# Patient Record
Sex: Female | Born: 1975 | Race: White | Hispanic: No | Marital: Single | Smoking: Never smoker
Health system: Southern US, Community
[De-identification: ages and names within clinical notes are randomized; demographics above are authoritative.]

## PROBLEM LIST (undated history)

## (undated) DIAGNOSIS — E559 Vitamin D deficiency, unspecified: Secondary | ICD-10-CM

## (undated) DIAGNOSIS — Z803 Family history of malignant neoplasm of breast: Secondary | ICD-10-CM

## (undated) DIAGNOSIS — F419 Anxiety disorder, unspecified: Secondary | ICD-10-CM

## (undated) DIAGNOSIS — R51 Headache: Secondary | ICD-10-CM

## (undated) DIAGNOSIS — J189 Pneumonia, unspecified organism: Secondary | ICD-10-CM

## (undated) DIAGNOSIS — K219 Gastro-esophageal reflux disease without esophagitis: Secondary | ICD-10-CM

## (undated) DIAGNOSIS — I429 Cardiomyopathy, unspecified: Secondary | ICD-10-CM

## (undated) DIAGNOSIS — F32A Depression, unspecified: Secondary | ICD-10-CM

## (undated) DIAGNOSIS — Z800 Family history of malignant neoplasm of digestive organs: Secondary | ICD-10-CM

## (undated) DIAGNOSIS — C801 Malignant (primary) neoplasm, unspecified: Secondary | ICD-10-CM

## (undated) DIAGNOSIS — R519 Headache, unspecified: Secondary | ICD-10-CM

## (undated) DIAGNOSIS — Z87442 Personal history of urinary calculi: Secondary | ICD-10-CM

## (undated) HISTORY — DX: Anxiety disorder, unspecified: F41.9

## (undated) HISTORY — DX: Family history of malignant neoplasm of digestive organs: Z80.0

## (undated) HISTORY — DX: Family history of malignant neoplasm of breast: Z80.3

## (undated) HISTORY — DX: Vitamin D deficiency, unspecified: E55.9

## (undated) HISTORY — DX: Cardiomyopathy, unspecified: I42.9

## (undated) HISTORY — DX: Gastro-esophageal reflux disease without esophagitis: K21.9

## (undated) HISTORY — PX: OTHER SURGICAL HISTORY: SHX169

---

## 2012-05-02 ENCOUNTER — Encounter (HOSPITAL_COMMUNITY): Payer: Self-pay | Admitting: *Deleted

## 2012-05-02 ENCOUNTER — Emergency Department (HOSPITAL_COMMUNITY)
Admission: EM | Admit: 2012-05-02 | Discharge: 2012-05-03 | Disposition: A | Discharge status: Home / Self Care | Payer: 59 | Attending: Emergency Medicine | Admitting: Emergency Medicine

## 2012-05-02 DIAGNOSIS — R45851 Suicidal ideations: Secondary | ICD-10-CM | POA: Insufficient documentation

## 2012-05-02 DIAGNOSIS — R4789 Other speech disturbances: Secondary | ICD-10-CM | POA: Insufficient documentation

## 2012-05-02 LAB — COMPREHENSIVE METABOLIC PANEL
ALT: 11 U/L (ref 0–35)
AST: 15 U/L (ref 0–37)
Albumin: 3.4 g/dL — ABNORMAL LOW (ref 3.5–5.2)
Alkaline Phosphatase: 66 U/L (ref 39–117)
Chloride: 106 mEq/L (ref 96–112)
Potassium: 3.4 mEq/L — ABNORMAL LOW (ref 3.5–5.1)
Sodium: 138 mEq/L (ref 135–145)
Total Bilirubin: 0.5 mg/dL (ref 0.3–1.2)

## 2012-05-02 LAB — CBC
HCT: 33.2 % — ABNORMAL LOW (ref 36.0–46.0)
Platelets: 155 10*3/uL (ref 150–400)
RDW: 12.4 % (ref 11.5–15.5)
WBC: 5.2 10*3/uL (ref 4.0–10.5)

## 2012-05-02 LAB — DIFFERENTIAL
Basophils Absolute: 0 10*3/uL (ref 0.0–0.1)
Lymphocytes Relative: 18 % (ref 12–46)
Monocytes Relative: 5 % (ref 3–12)
Neutro Abs: 3.9 10*3/uL (ref 1.7–7.7)

## 2012-05-02 LAB — URINALYSIS, ROUTINE W REFLEX MICROSCOPIC
Glucose, UA: NEGATIVE mg/dL
Leukocytes, UA: NEGATIVE
Protein, ur: NEGATIVE mg/dL

## 2012-05-02 LAB — ETHANOL: Alcohol, Ethyl (B): <11 mg/dL (ref 0–11)

## 2012-05-02 LAB — RAPID URINE DRUG SCREEN, HOSP PERFORMED: Amphetamines: NOT DETECTED

## 2012-05-02 MED ORDER — FLUTICASONE PROPIONATE 50 MCG/ACT NA SUSP
2.0000 | Freq: Every day | NASAL | Status: DC
  Filled 2012-05-02: qty 16

## 2012-05-02 MED ORDER — TRAZODONE HCL 100 MG PO TABS: 100.0000 mg | ORAL_TABLET | Freq: Every day | ORAL | Status: DC

## 2012-05-02 MED ORDER — VENLAFAXINE HCL 75 MG PO TABS
75.0000 mg | ORAL_TABLET | Freq: Every morning | ORAL | Status: DC
  Filled 2012-05-02: qty 1

## 2012-05-02 NOTE — ED Provider Notes (Addendum)
History     CSN: [id number]  Arrival date & time 05/02/12  1758   First MD Initiated Contact with Patient 05/02/12 1809      Chief Complaint  Patient presents with  . Drug Overdose    (Consider location/radiation/quality/duration/timing/severity/associated sxs/prior treatment) Patient is a 36 y.o. female presenting with Overdose. The history is provided by the patient.  Drug Overdose  pt took 600mg  of trazodone pta in a suicide attempt. She admits to excessive stress at work--no etoh or drug use, no prior h/o same-denies tylenol or aspirin use--pt told her ex-bofriend she took the meds and he called ems  Past Medical History  Diagnosis Date  . Kidney stone     History reviewed. No pertinent past surgical history.  No family history on file.  History  Substance Use Topics  . Smoking status: Never Smoker   . Smokeless tobacco: Not on file  . Alcohol Use: No    OB History    Grav Para Term Preterm Abortions TAB SAB Ect Mult Living                  Review of Systems  All other systems reviewed and are negative.    Allergies  Review of patient's allergies indicates no known allergies.  Home Medications   Current Outpatient Rx  Name Route Sig Dispense Refill  . ALPRAZOLAM 2 MG PO TABS Oral Take 2 mg by mouth at bedtime as needed. Sleep    . FLUTICASONE PROPIONATE 50 MCG/ACT NA SUSP Nasal Place 2 sprays into the nose daily.    . TRAZODONE HCL 100 MG PO TABS Oral Take 100 mg by mouth at bedtime.    . VENLAFAXINE HCL 75 MG PO TABS Oral Take 75 mg by mouth every morning.      BP 96/51  Pulse 87  Temp 98.1 F (36.7 C) (Oral)  Resp 18  LMP 03/17/2012  Physical Exam  Nursing note and vitals reviewed. Constitutional: She is oriented to person, place, and time. She appears well-developed and well-nourished.  Non-toxic appearance. No distress.  HENT:  Head: Normocephalic and atraumatic.  Eyes: Conjunctivae, EOM and lids are normal. Pupils are equal, round, and  reactive to light.  Neck: Normal range of motion. Neck supple. No tracheal deviation present. No mass present.  Cardiovascular: Normal rate, regular rhythm and normal heart sounds.  Exam reveals no gallop.   No murmur heard. Pulmonary/Chest: Effort normal and breath sounds normal. No stridor. No respiratory distress. She has no decreased breath sounds. She has no wheezes. She has no rhonchi. She has no rales.  Abdominal: Soft. Normal appearance and bowel sounds are normal. She exhibits no distension. There is no tenderness. There is no rebound and no CVA tenderness.  Musculoskeletal: Normal range of motion. She exhibits no edema and no tenderness.  Neurological: She is alert and oriented to person, place, and time. She has normal strength. No cranial nerve deficit or sensory deficit. GCS eye subscore is 4. GCS verbal subscore is 5. GCS motor subscore is 6.  Skin: Skin is warm and dry. No abrasion and no rash noted.  Psychiatric: Her affect is blunt. Her speech is delayed. She is slowed. She expresses suicidal ideation. She expresses suicidal plans.    ED Course  Procedures (including critical care time)   Labs Reviewed  ETHANOL  URINE RAPID DRUG SCREEN (HOSP PERFORMED)  URINALYSIS, ROUTINE W REFLEX MICROSCOPIC  CBC  DIFFERENTIAL  COMPREHENSIVE METABOLIC PANEL  SALICYLATE LEVEL  ACETAMINOPHEN LEVEL  No results found.   No diagnosis found.    MDM  Pt medically cleared --act to see        Toy Baker, MD 05/02/12 1610  Toy Baker, MD 05/02/12 [phone]

## 2012-05-02 NOTE — ED Notes (Signed)
[id number]<AV> Expected date:05/02/12<BR> Expected time: 5:41 PM<BR> Means of arrival:Ambulance<BR> Comments:<BR> OD

## 2012-05-02 NOTE — ED Notes (Signed)
Patient aware of need for urine testing. Patient unable to void at this time. Patient encouraged to call for toileting assistance  

## 2012-05-02 NOTE — ED Notes (Signed)
Patient ambulated to bathroom. Unable to void. Patient aware of urine specimen. Patient encouraged to call for assistance for help to bathroom

## 2012-05-02 NOTE — ED Notes (Addendum)
Poison control called, spoke with Gina: recommends 4 hour observation, cardiac monitoring, 4 hr tylenol level, fluids for low blood pressure, check K and Mg, repeat EKG after replacement of K and Mg if needed; For Xananx: check for CNS depression and respiratory depression
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
## 2012-05-03 NOTE — Discharge Instructions (Signed)
Depression You have signs of depression. This is a common problem. It can occur at any age. It is often hard to recognize. People can suffer from depression and still have moments of enjoyment. Depression interferes with your basic ability to function in life. It upsets your relationships, sleep, eating, and work habits. CAUSES  Depression is believed to be caused by an imbalance in brain chemicals. It may be triggered by an unpleasant event. Relationship crises, a death in the family, financial worries, retirement, or other stressors are normal causes of depression. Depression may also start for no known reason. Other factors that may play a part include medical illnesses, some medicines, genetics, and alcohol or drug abuse. SYMPTOMS   Feeling unhappy or worthless.   Long-lasting (chronic) tiredness or worn-out feeling.   Self-destructive thoughts and actions.   Not being able to sleep or sleeping too much.   Eating more than usual or not eating at all.   Headaches or feeling anxious.   Trouble concentrating or making decisions.   Unexplained physical problems and substance abuse.  TREATMENT  Depression usually gets better with treatment. This can include:  Antidepressant medicines. It can take weeks before the proper dose is achieved and benefits are reached.   Talking with a therapist, clergyperson, counselor, or friend. These people can help you gain insight into your problem and regain control of your life.   Eating a good diet.   Getting regular physical exercise, such as walking for 30 minutes every day.   Not abusing alcohol or drugs.  Treating depression often takes 6 months or longer. This length of treatment is needed to keep symptoms from returning. Call your caregiver and arrange for follow-up care as suggested. SEEK IMMEDIATE MEDICAL CARE IF:   You start to have thoughts of hurting yourself or others.   Call your local emergency services (911 in [place]   Go to  your local medical emergency department.   Call the National Suicide Prevention Lifeline: 1-800-273-TALK (986)408-3388).

## 2012-05-03 NOTE — ED Notes (Signed)
Patient care assumed at 12 AM with telemetry psych consult pending. Patient evaluated by psychiatrist Dr.Diaz who feels that patient is psychiatrically stable and is not a danger to self or others. He is cleared for discharge home to followup with community mental health. Patient evaluated is not suicidal and feels comfortable with this plan. Labs reviewed as below.  Results for orders placed during the hospital encounter of 05/02/12  ETHANOL      Component Value Range   Alcohol, Ethyl (B) <11  0 - 11 mg/dL  URINE RAPID DRUG SCREEN (HOSP PERFORMED)      Component Value Range   Opiates NONE DETECTED  NONE DETECTED   Cocaine NONE DETECTED  NONE DETECTED   Benzodiazepines POSITIVE (*) NONE DETECTED   Amphetamines NONE DETECTED  NONE DETECTED   Tetrahydrocannabinol NONE DETECTED  NONE DETECTED   Barbiturates NONE DETECTED  NONE DETECTED  URINALYSIS, ROUTINE W REFLEX MICROSCOPIC      Component Value Range   Color, Urine YELLOW  YELLOW   APPearance CLEAR  CLEAR   Specific Gravity, Urine 1.017  1.005 - 1.030   pH 7.0  5.0 - 8.0   Glucose, UA NEGATIVE  NEGATIVE mg/dL   Hgb urine dipstick NEGATIVE  NEGATIVE   Bilirubin Urine NEGATIVE  NEGATIVE   Ketones, ur NEGATIVE  NEGATIVE mg/dL   Protein, ur NEGATIVE  NEGATIVE mg/dL   Urobilinogen, UA 1.0  0.0 - 1.0 mg/dL   Nitrite NEGATIVE  NEGATIVE   Leukocytes, UA NEGATIVE  NEGATIVE  CBC      Component Value Range   WBC 5.2  4.0 - 10.5 K/uL   RBC 3.67 (*) 3.87 - 5.11 MIL/uL   Hemoglobin 11.3 (*) 12.0 - 15.0 g/dL   HCT 14.7 (*) 82.9 - 56.2 %   MCV 90.5  78.0 - 100.0 fL   MCH 30.8  26.0 - 34.0 pg   MCHC 34.0  30.0 - 36.0 g/dL   RDW 13.0  86.5 - 78.4 %   Platelets 155  150 - 400 K/uL  DIFFERENTIAL      Component Value Range   Neutrophils Relative 75  43 - 77 %   Lymphocytes Relative 18  12 - 46 %   Monocytes Relative 5  3 - 12 %   Eosinophils Relative 2  0 - 5 %   Basophils Relative 0  0 - 1 %   Neutro Abs 3.9  1.7 - 7.7 K/uL   Lymphs Abs  0.9  0.7 - 4.0 K/uL   Monocytes Absolute 0.3  0.1 - 1.0 K/uL   Eosinophils Absolute 0.1  0.0 - 0.7 K/uL   Basophils Absolute 0.0  0.0 - 0.1 K/uL   Smear Review MORPHOLOGY UNREMARKABLE    COMPREHENSIVE METABOLIC PANEL      Component Value Range   Sodium 138  135 - 145 mEq/L   Potassium 3.4 (*) 3.5 - 5.1 mEq/L   Chloride 106  96 - 112 mEq/L   CO2 25  19 - 32 mEq/L   Glucose, Bld 90  70 - 99 mg/dL   BUN 7  6 - 23 mg/dL   Creatinine, Ser 6.96  0.50 - 1.10 mg/dL   Calcium 8.2 (*) 8.4 - 10.5 mg/dL   Total Protein 6.0  6.0 - 8.3 g/dL   Albumin 3.4 (*) 3.5 - 5.2 g/dL   AST 15  0 - 37 U/L   ALT 11  0 - 35 U/L   Alkaline Phosphatase 66  39 -  117 U/L   Total Bilirubin 0.5  0.3 - 1.2 mg/dL   GFR calc non Af Amer 90 (*) >90 mL/min   GFR calc Af Amer >90  >90 mL/min  SALICYLATE LEVEL      Component Value Range   Salicylate Lvl <2.0 (*) 2.8 - 20.0 mg/dL  ACETAMINOPHEN LEVEL      Component Value Range   Acetaminophen (Tylenol), Serum <15.0  10 - 30 ug/mL  POCT PREGNANCY, URINE      Component Value Range   Preg Test, Ur NEGATIVE  NEGATIVE       Sunnie Nielsen, MD 05/03/12 [phone]

## 2012-05-03 NOTE — B[facility] Counselor (Signed)
Writer gave pt list of local psychiatrists and therapists per pt's earlier request. Pt indicates she will contact someone from list at earliest convenience.

## 2014-12-03 ENCOUNTER — Encounter (HOSPITAL_COMMUNITY): Payer: Self-pay | Admitting: Emergency Medicine

## 2014-12-03 ENCOUNTER — Emergency Department (HOSPITAL_COMMUNITY)
Admission: EM | Admit: 2014-12-03 | Discharge: 2014-12-04 | Disposition: A | Discharge status: Home / Self Care | Payer: BLUE CROSS/BLUE SHIELD | Attending: Emergency Medicine | Admitting: Emergency Medicine

## 2014-12-03 DIAGNOSIS — Z79899 Other long term (current) drug therapy: Secondary | ICD-10-CM | POA: Diagnosis not present

## 2014-12-03 DIAGNOSIS — R1084 Generalized abdominal pain: Secondary | ICD-10-CM | POA: Diagnosis not present

## 2014-12-03 DIAGNOSIS — Z3202 Encounter for pregnancy test, result negative: Secondary | ICD-10-CM | POA: Diagnosis not present

## 2014-12-03 DIAGNOSIS — Z87442 Personal history of urinary calculi: Secondary | ICD-10-CM | POA: Insufficient documentation

## 2014-12-03 DIAGNOSIS — R197 Diarrhea, unspecified: Secondary | ICD-10-CM | POA: Insufficient documentation

## 2014-12-03 DIAGNOSIS — Z7951 Long term (current) use of inhaled steroids: Secondary | ICD-10-CM | POA: Insufficient documentation

## 2014-12-03 DIAGNOSIS — R531 Weakness: Secondary | ICD-10-CM | POA: Insufficient documentation

## 2014-12-03 DIAGNOSIS — R6883 Chills (without fever): Secondary | ICD-10-CM | POA: Diagnosis not present

## 2014-12-03 DIAGNOSIS — R112 Nausea with vomiting, unspecified: Secondary | ICD-10-CM

## 2014-12-03 LAB — PREGNANCY, URINE: Preg Test, Ur: NEGATIVE

## 2014-12-03 LAB — CBC WITH DIFFERENTIAL/PLATELET
BASOS PCT: 0 % (ref 0–1)
Basophils Absolute: 0 10*3/uL (ref 0.0–0.1)
EOS ABS: 0 10*3/uL (ref 0.0–0.7)
Eosinophils Relative: 0 % (ref 0–5)
HCT: 36.9 % (ref 36.0–46.0)
Hemoglobin: 12.3 g/dL (ref 12.0–15.0)
Lymphocytes Relative: 6 % — ABNORMAL LOW (ref 12–46)
Lymphs Abs: 0.4 10*3/uL — ABNORMAL LOW (ref 0.7–4.0)
MCH: 30.5 pg (ref 26.0–34.0)
MCHC: 33.3 g/dL (ref 30.0–36.0)
MCV: 91.6 fL (ref 78.0–100.0)
Monocytes Absolute: 0.3 10*3/uL (ref 0.1–1.0)
Monocytes Relative: 5 % (ref 3–12)
Neutro Abs: 5.1 10*3/uL (ref 1.7–7.7)
Neutrophils Relative %: 89 % — ABNORMAL HIGH (ref 43–77)
Platelets: 160 10*3/uL (ref 150–400)
RBC: 4.03 MIL/uL (ref 3.87–5.11)
RDW: 13 % (ref 11.5–15.5)
WBC: 5.8 10*3/uL (ref 4.0–10.5)

## 2014-12-03 LAB — COMPREHENSIVE METABOLIC PANEL
ALK PHOS: 65 U/L (ref 39–117)
ALT: 10 U/L (ref 0–35)
ANION GAP: 8 (ref 5–15)
AST: 22 U/L (ref 0–37)
Albumin: 3.7 g/dL (ref 3.5–5.2)
BUN: 12 mg/dL (ref 6–23)
CO2: 22 mmol/L (ref 19–32)
Calcium: 8.2 mg/dL — ABNORMAL LOW (ref 8.4–10.5)
Chloride: 104 mEq/L (ref 96–112)
Creatinine, Ser: 0.87 mg/dL (ref 0.50–1.10)
GFR calc non Af Amer: 85 mL/min — ABNORMAL LOW (ref >90)
Glucose, Bld: 128 mg/dL — ABNORMAL HIGH (ref 70–99)
Potassium: 3.7 mmol/L (ref 3.5–5.1)
Sodium: 134 mmol/L — ABNORMAL LOW (ref 135–145)
Total Bilirubin: 0.8 mg/dL (ref 0.3–1.2)
Total Protein: 6.7 g/dL (ref 6.0–8.3)

## 2014-12-03 LAB — URINALYSIS, ROUTINE W REFLEX MICROSCOPIC
Bilirubin Urine: NEGATIVE
Glucose, UA: NEGATIVE mg/dL
Hgb urine dipstick: NEGATIVE
Ketones, ur: NEGATIVE mg/dL
Leukocytes, UA: NEGATIVE
Nitrite: NEGATIVE
Protein, ur: NEGATIVE mg/dL
SPECIFIC GRAVITY, URINE: 1.027 (ref 1.005–1.030)
Urobilinogen, UA: 1 mg/dL (ref 0.0–1.0)
pH: 6.5 (ref 5.0–8.0)

## 2014-12-03 MED ORDER — SODIUM CHLORIDE 0.9 % IV BOLUS (SEPSIS)
1000.0000 mL | Freq: Once | INTRAVENOUS | Status: AC
  Administered 2014-12-03: 1000 mL via INTRAVENOUS

## 2014-12-03 MED ORDER — ONDANSETRON HCL 4 MG/2ML IJ SOLN
4.0000 mg | Freq: Once | INTRAMUSCULAR | Status: AC
  Administered 2014-12-03: 4 mg via INTRAVENOUS
  Filled 2014-12-03: qty 2

## 2014-12-03 NOTE — ED Notes (Addendum)
Pt c/o generalized weakness beginning at 5 am this morning. Pt from home.  Pt c/o N/V/D.  A&Ox4. Pt ambulatory. Denies pain.

## 2014-12-03 NOTE — ED Notes (Signed)
Bed: WA06 Expected date:  Expected time:  Means of arrival:  Comments: EMS generalized weakness, N/V

## 2014-12-03 NOTE — ED Provider Notes (Signed)
CSN: [id number]     Arrival date & time 12/03/14  2118 History   First MD Initiated Contact with Patient 12/03/14 2143     Chief Complaint  Patient presents with  . Nausea  . Emesis  . Diarrhea  . Weakness     (Consider location/radiation/quality/duration/timing/severity/associated sxs/prior Treatment) Patient is a 39 y.o. female presenting with vomiting, diarrhea, and weakness. The history is provided by the patient.  Emesis Severity:  Mild Timing:  Rare Number of daily episodes:  1 Quality:  Stomach contents Progression:  Worsening Chronicity:  New Recent urination:  Normal Relieved by:  None tried Worsened by:  Food smell and liquids Ineffective treatments:  None tried Associated symptoms: abdominal pain, chills, diarrhea and fever   Associated symptoms: no myalgias and no URI   Associated symptoms comment:  Generalized weakness Abdominal pain:    Location:  Generalized   Quality:  Aching and cramping   Severity:  Moderate   Onset quality:  Gradual   Duration:  15 hours   Timing:  Constant   Progression:  Worsening   Chronicity:  New Diarrhea:    Quality:  Watery   Number of occurrences:  3   Timing:  Intermittent Risk factors: no alcohol use, no diabetes, not pregnant now, no sick contacts and no suspect food intake   Diarrhea Associated symptoms: abdominal pain, chills and vomiting   Associated symptoms: no myalgias and no URI   Weakness Associated symptoms include abdominal pain.    Past Medical History  Diagnosis Date  . Kidney stone    History reviewed. No pertinent past surgical history. No family history on file. History  Substance Use Topics  . Smoking status: Never Smoker   . Smokeless tobacco: Not on file  . Alcohol Use: No   OB History    No data available     Review of Systems  Constitutional: Positive for chills.  Gastrointestinal: Positive for vomiting, abdominal pain and diarrhea.  Musculoskeletal: Negative for myalgias.   Neurological: Positive for weakness.  All other systems reviewed and are negative.     Allergies  Review of patient's allergies indicates no known allergies.  Home Medications   Prior to Admission medications   Medication Sig Start Date End Date Taking? Authorizing Provider  naproxen sodium (ANAPROX) 220 MG tablet Take 220 mg by mouth 2 (two) times daily as needed (pain).   Yes Historical Provider, MD  triamcinolone (NASACORT) 55 MCG/ACT AERO nasal inhaler Place 2 sprays into the nose daily.   Yes Historical Provider, MD  venlafaxine (EFFEXOR) 37.5 MG tablet Take 37.5 mg by mouth daily.   Yes Historical Provider, MD   BP 104/51 mmHg  Pulse 108  Temp(Src) 98.3 F (36.8 C) (Oral)  Resp 18  SpO2 100%  LMP 11/26/2014 Physical Exam  Constitutional: She is oriented to person, place, and time. She appears well-developed and well-nourished. No distress.  HENT:  Head: Normocephalic and atraumatic.  Mouth/Throat: Oropharynx is clear and moist.  Eyes: Conjunctivae and EOM are normal. Pupils are equal, round, and reactive to light.  Neck: Normal range of motion. Neck supple.  Cardiovascular: Normal rate, regular rhythm and intact distal pulses.   No murmur heard. Pulmonary/Chest: Effort normal and breath sounds normal. No respiratory distress. She has no wheezes. She has no rales.  Abdominal: Soft. She exhibits no distension. There is generalized tenderness. There is no rebound and no guarding.  Musculoskeletal: Normal range of motion. She exhibits no edema or tenderness.  Neurological: She  is alert and oriented to person, place, and time.  Skin: Skin is warm and dry. No rash noted. No erythema.  Psychiatric: She has a normal mood and affect. Her behavior is normal.  Nursing note and vitals reviewed.   ED Course  Procedures (including critical care time) Labs Review Labs Reviewed  CBC WITH DIFFERENTIAL - Abnormal; Notable for the following:    Neutrophils Relative % 89 (*)     Lymphocytes Relative 6 (*)    Lymphs Abs 0.4 (*)    All other components within normal limits  COMPREHENSIVE METABOLIC PANEL - Abnormal; Notable for the following:    Sodium 134 (*)    Glucose, Bld 128 (*)    Calcium 8.2 (*)    GFR calc non Af Amer 85 (*)    All other components within normal limits  URINALYSIS, ROUTINE W REFLEX MICROSCOPIC - Abnormal; Notable for the following:    APPearance CLOUDY (*)    All other components within normal limits  PREGNANCY, URINE    Imaging Review No results found.   EKG Interpretation   Date/Time:  Saturday December 03 2014 23:37:14 EST Ventricular Rate:  91 PR Interval:  130 QRS Duration: 93 QT Interval:  360 QTC Calculation: 443 R Axis:   74 Text Interpretation:  Sinus rhythm Anteroseptal infarct, age indeterminate  No significant change since last tracing Confirmed by [facility]  MD,  Nathanie Ottley ([id number]) on 12/04/2014 12:18:25 AM      MDM   Final diagnoses:  Nausea vomiting and diarrhea    Patient with nausea, vomiting, diarrhea and diffuse weakness since 5 AM this morning. She complains of mild diffuse abdominal tenderness but denies focal area of tenderness. No prior abdominal surgeries. Denies any urinary symptoms today and last mentioned. Was 2 weeks ago. On exam patient is mildly tachycardic with a normal blood pressure. She is able to answer all questions appropriately and denies new medications. No bad food exposure and sick contacts that she knows of.  Family states that she had 30 minutes of low blood pressure that was 37C to 58I systolic and resolved after she vomited. She did have amoxicillin approximately 3 weeks ago.  11:17 PM. All labs wnl.  Pt feeling a little better but since lying down now having some reflux in the upper abd and chest.  Burning and tightness.  No SOB.  Will do screening EKG.  12:18 AM EKG without acute findings. After ginger ale patient feels better. She is ambulating multiple times to the bathroom without  any weakness or near syncope. Will discharge home given strict return precautions.  Blanchie Dessert, MD 12/04/14 0020

## 2014-12-04 MED ORDER — ONDANSETRON HCL 4 MG PO TABS: 4.0000 mg | ORAL_TABLET | Freq: Four times a day (QID) | ORAL | Status: DC

## 2015-01-23 ENCOUNTER — Other Ambulatory Visit: Payer: Self-pay | Admitting: Physician Assistant

## 2015-01-23 ENCOUNTER — Other Ambulatory Visit (HOSPITAL_COMMUNITY)
Admission: RE | Admit: 2015-01-23 | Discharge: 2015-01-23 | Disposition: A | Discharge status: Home / Self Care | Payer: BLUE CROSS/BLUE SHIELD | Source: Ambulatory Visit | Attending: Family Medicine | Admitting: Family Medicine

## 2015-01-23 DIAGNOSIS — Z124 Encounter for screening for malignant neoplasm of cervix: Secondary | ICD-10-CM | POA: Insufficient documentation

## 2015-01-25 LAB — CYTOLOGY - PAP

## 2016-09-21 DIAGNOSIS — Z23 Encounter for immunization: Secondary | ICD-10-CM | POA: Diagnosis not present

## 2016-10-23 ENCOUNTER — Ambulatory Visit (INDEPENDENT_AMBULATORY_CARE_PROVIDER_SITE_OTHER): Payer: BLUE CROSS/BLUE SHIELD | Admitting: Family Medicine

## 2016-10-23 VITALS — BP 112/74 | HR 121 | Temp 98.9°F | Resp 17 | Ht 66.0 in | Wt 154.0 lb

## 2016-10-23 DIAGNOSIS — M545 Low back pain, unspecified: Secondary | ICD-10-CM

## 2016-10-23 MED ORDER — CYCLOBENZAPRINE HCL 5 MG PO TABS: 5.0000 mg | ORAL_TABLET | Freq: Three times a day (TID) | ORAL | 1 refills | Status: DC | PRN

## 2016-10-23 MED ORDER — MELOXICAM 7.5 MG PO TABS: 7.5000 mg | ORAL_TABLET | Freq: Every day | ORAL | 0 refills | Status: DC

## 2016-10-23 NOTE — Patient Instructions (Addendum)
  Take the mobic once a day for the next week, then as needed.  Use the flexeril as needed.   Stay as active as possible.  If your symptoms are not improving in 1-2 weeks, let us know.  Take care,  Dr Jerline Pain   IF you received an x-ray today, you will receive an invoice from [facility]. Please contact [facility] at [phone] with questions or concerns regarding your invoice.   IF you received labwork today, you will receive an invoice from [employer]. Please contact [facility] at [phone] with questions or concerns regarding your invoice.   Our billing staff will not be able to assist you with questions regarding bills from these companies.  You will be contacted with the lab results as soon as they are available. The fastest way to get your results is to activate your My Chart account. Instructions are located on the last page of this paperwork. If you have not heard from [name] regarding the results in 2 weeks, please contact this office.

## 2016-10-23 NOTE — Progress Notes (Signed)
   Sheila [name] is a 40 y.o. female who presents to [facility] today for back pain  1.  Back Pain Symptoms started about a week and a half ago after a car ride to [place] (7 hours each way). Pain has gotten significantly worse over the past couple of days. Pain is mostly located in the middle of her lower back. No trauma or other obvious precipitating events. Pain does not radiate. She has tried taking tylenol which helped some. She also tried using ice and salonpas which helped. No fevers or chills. No weakness or numbness. No abdominal pain. No nausea or vomiting. No dysuria. No bowel or bladder incontinence. No saddle anesthesia.   ROS as above.  PMH reviewed. Patient is a nonsmoker.   Past Medical History:  Diagnosis Date  . Kidney stone    No past surgical history on file.  Medications reviewed. Current Outpatient Prescriptions  Medication Sig Dispense Refill  . naproxen sodium (ANAPROX) 220 MG tablet Take 220 mg by mouth 2 (two) times daily as needed (pain).    . triamcinolone (NASACORT) 55 MCG/ACT AERO nasal inhaler Place 2 sprays into the nose daily.    Marland Kitchen venlafaxine (EFFEXOR) 37.5 MG tablet Take 37.5 mg by mouth daily.     No current facility-administered medications for this visit.      Physical Exam:  BP 112/74 (BP Location: Right Arm, Patient Position: Sitting, Cuff Size: Normal)   Pulse (!) 121   Temp 98.9 F (37.2 C) (Oral)   Resp 17   Ht 5\' 6"  (1.676 m)   Wt 154 lb (69.9 kg)   LMP 10/11/2016 (Approximate)   SpO2 98%   BMI 24.86 kg/m  Gen:  Alert, cooperative patient who appears stated age in no acute distress.  Vital signs reviewed. HEENT: EOMI,  MMM Pulm:  Clear to auscultation bilaterally with good air movement.  No wheezes or rales noted.   Cardiac:  Regular rate and rhythm without murmur auscultated.  Good S1/S2. Exts: Non edematous BL  LE, warm and well perfused.  MSK: - BACK: No deformities. Tender to palpation over right  paraspinal muscles. No bony tenderness. FROM. - LLE: Strength 5/5 throughout. Sensation to light touch intact.  - RLE: Strength 5/5 throughout. Sensation to light touch intact. Straight leg raise test positive.   Assessment and Plan:  1.  Low back Pain Likely MSK given muscular tenderness on exam. No red flag signs or symptoms. Will treat conservatively with mobic and flexeril. Encouraged patient to stay as active as possible. Return precautions reviewed. Follow up as needed.

## 2016-11-19 ENCOUNTER — Other Ambulatory Visit: Payer: Self-pay | Admitting: Family Medicine

## 2017-06-03 ENCOUNTER — Other Ambulatory Visit (HOSPITAL_COMMUNITY)
Admission: RE | Admit: 2017-06-03 | Discharge: 2017-06-03 | Disposition: A | Discharge status: Home / Self Care | Payer: PRIVATE HEALTH INSURANCE | Source: Ambulatory Visit | Attending: Family Medicine | Admitting: Family Medicine

## 2017-06-03 ENCOUNTER — Other Ambulatory Visit: Payer: Self-pay | Admitting: Family Medicine

## 2017-06-03 DIAGNOSIS — Z124 Encounter for screening for malignant neoplasm of cervix: Secondary | ICD-10-CM | POA: Insufficient documentation

## 2017-06-05 ENCOUNTER — Other Ambulatory Visit: Payer: Self-pay | Admitting: Family Medicine

## 2017-06-05 DIAGNOSIS — N63 Unspecified lump in unspecified breast: Secondary | ICD-10-CM

## 2017-06-05 LAB — CYTOLOGY - PAP: Diagnosis: NEGATIVE

## 2017-06-11 ENCOUNTER — Other Ambulatory Visit: Payer: Self-pay | Admitting: Family Medicine

## 2017-06-11 ENCOUNTER — Ambulatory Visit
Admission: RE | Admit: 2017-06-11 | Discharge: 2017-06-11 | Disposition: A | Discharge status: Home / Self Care | Payer: PRIVATE HEALTH INSURANCE | Source: Ambulatory Visit | Attending: Family Medicine | Admitting: Family Medicine

## 2017-06-11 DIAGNOSIS — N63 Unspecified lump in unspecified breast: Secondary | ICD-10-CM

## 2017-06-12 ENCOUNTER — Telehealth: Payer: Self-pay | Admitting: *Deleted

## 2017-06-12 NOTE — Telephone Encounter (Signed)
Confirmed [facility] for 06/18/17 at 0815 .  Instructions and contact information given.  

## 2017-06-16 DIAGNOSIS — Z170 Estrogen receptor positive status [ER+]: Principal | ICD-10-CM

## 2017-06-16 DIAGNOSIS — C50411 Malignant neoplasm of upper-outer quadrant of right female breast: Secondary | ICD-10-CM | POA: Insufficient documentation

## 2017-06-16 NOTE — Progress Notes (Signed)
[place]  Telephone:(336) [phone] Fax:([phone]) [place] Note   Patient Care Team: Patient, No Pcp Per as PCP - General (General Practice) [name] (Family Medicine) [name], MD as Consulting Physician (General Surgery) [name], MD as Consulting Physician (Hematology) Kyung Rudd, MD as Consulting Physician (Radiation Oncology) 06/18/2017  CHIEF COMPLAINTS/PURPOSE OF CONSULTATION:  Consultation for Malignant neoplasm of upper-outer quadrant of right breast in female   Oncology History   Cancer Staging Malignant neoplasm of upper-outer quadrant of right breast in female, estrogen receptor positive ([place]) Staging form: Breast, AJCC 8th Edition - Clinical stage from 06/11/2017: Stage IB (cT3, cN0, cM0, G2, ER: Positive, PR: Positive, HER2: Positive) - Signed by [name], MD on 06/16/2017       Malignant neoplasm of upper-outer quadrant of right breast in female, estrogen receptor positive ([place])   06/11/2017 Initial Biopsy    Diagnosis 06/11/17 Breast, right, needle core biopsy, 11:00 o'clock - INVASIVE DUCTAL CARCINOMA, G2      06/11/2017 Mammogram    [name] and MM Diagnostic Breast Tomo Bilateral 06/11/17 IMPRESSION: 1. Highly suspicious mass within the retroareolar right breast, extending from the 8:00 to 11:00 axes, anterior to posterior depth, extending anteriorly to the nipple with associated nipple retraction, measuring at least 5.5 cm by ultrasound  2. No sonographic evidence of metastatic lymphadenopathy in the right axilla. 3. No evidence of malignancy within the left breast.       06/11/2017 Initial Diagnosis    Malignant neoplasm of upper-outer quadrant of right breast in female, estrogen receptor positive ([place])      06/11/2017 Receptors her2    Estrogen Receptor: 100%, POSITIVE, STRONG STAINING INTENSITY Progesterone Receptor: 100%, POSITIVE, STRONG STAINING INTENSITY Proliferation Marker Ki67: 12%Proliferation  Marker Ki67: 12%  HER2 - **POSITIVE** RATIO OF HER2/CEP17 SIGNALS 2.55 AVERAGE HER2 COPY NUMBER PER CELL 4.85        HISTORY OF PRESENTING ILLNESS: 01/21/13 Sheila [name] 41 y.o. female is here because of newly diagnosed Malignant neoplasm of upper-outer quadrant of right breast. She presents to the breast clinic today with her common law husband. She felt the lump initially when she had her physical 2 weeks ago, she had felt her nipples retracted several years ago. A few months ago it has become more pronounced. She had no pain or discharge.   In the past was diagnosed with Vitamin D deficiency and high cholesterol. She previously was on mood stabilizing medication. She feel right now she does not need that medication but is overwhelmed. She donated eggs and previously had kidney stones removed. Her mother had breast cancer and was diagnosed at 41 along with paternal grandmother in her 5s.   Today she has heart burn which she is taking Prilosec and will see her GI later today. She has trouble sleeping and uses tylenol pm. She tried [place] which did not help, same with melatonin. Her common law husband Is on disability.    GYN HISTORY  Menarchal: 19 LMP: July 23rd, 2018 Contraceptive: 20-29 on birth control pill HRT: NA G0P0: does not want kids    MEDICAL HISTORY:  Past Medical History:  Diagnosis Date  . Anxiety   . GERD (gastroesophageal reflux disease)   . Kidney stone   . Vitamin D deficiency     SURGICAL HISTORY: Past Surgical History:  Procedure Laterality Date  . kidney stone removal      SOCIAL HISTORY: Social History   Social History  . Marital status: Single  Spouse name: N/A  . Number of children: N/A  . Years of education: N/A   Occupational History  . Not on file.   Social History Main Topics  . Smoking status: Never Smoker  . Smokeless tobacco: Former Systems developer  . Alcohol use No  . Drug use: No  . Sexual activity: Yes   Other Topics Concern  .  Not on file   Social History Narrative  . No narrative on file    FAMILY HISTORY: Family History  Problem Relation Age of Onset  . Breast cancer Paternal Grandmother 34  . Colon cancer Paternal Grandmother   . Breast cancer Mother 60  . Prostate cancer Paternal Grandfather   . Leukemia Paternal Grandfather   . Lung cancer Maternal Uncle     ALLERGIES:  has No Known Allergies.  MEDICATIONS:  Current Outpatient Prescriptions  Medication Sig Dispense Refill  . calcium carbonate (TUMS - DOSED IN MG ELEMENTAL CALCIUM) 500 MG chewable tablet Chew 1 tablet by mouth as needed for indigestion or heartburn.    . diphenhydramine-acetaminophen (TYLENOL PM) 25-500 MG TABS tablet Take 2 tablets by mouth at bedtime as needed.    . fluticasone (FLONASE) 50 MCG/ACT nasal spray Place 1 spray into both nostrils daily.    Marland Kitchen omeprazole (PRILOSEC) 20 MG capsule Take 20 mg by mouth daily.    . Vitamin D, Ergocalciferol, (DRISDOL) 50000 units CAPS capsule Take 50,000 Units by mouth once a week.  0   No current facility-administered medications for this visit.     REVIEW OF SYSTEMS:   Constitutional: Denies fevers, chills or abnormal night sweats (+) trouble sleeping  Eyes: Denies blurriness of vision, double vision or watery eyes Ears, nose, mouth, throat, and face: Denies mucositis or sore throat Respiratory: Denies cough, dyspnea or wheezes Cardiovascular: Denies palpitation, chest discomfort or lower extremity swelling Gastrointestinal:  Denies nausea, heartburn or change in bowel habits (+) heart burn Skin: Denies abnormal skin rashes Lymphatics: Denies new lymphadenopathy or easy bruising Neurological:Denies numbness, tingling or new weaknesses Behavioral/Psych: Mood is stable, no new changes  Breast: (+) palpable mass in right breast  All other systems were reviewed with the patient and are negative.  PHYSICAL EXAMINATION: ECOG PERFORMANCE STATUS: 0 - Asymptomatic  Vitals:   06/18/17  0840  BP: (!) 111/55  Pulse: (!) 104  Resp: 18  Temp: 98.2 F (36.8 C)   Filed Weights   06/18/17 0840  Weight: 160 lb 6.4 oz (72.8 kg)    GENERAL:alert, no distress and comfortable SKIN: skin color, texture, turgor are normal, no rashes or significant lesions EYES: normal, conjunctiva are pink and non-injected, sclera clear OROPHARYNX:no exudate, no erythema and lips, buccal mucosa, and tongue normal  NECK: supple, thyroid normal size, non-tender, without nodularity LYMPH:  no palpable lymphadenopathy in the cervical, axillary or inguinal LUNGS: clear to auscultation and percussion with normal breathing effort HEART: regular rate & rhythm and no murmurs and no lower extremity edema ABDOMEN:abdomen soft, non-tender and normal bowel sounds Musculoskeletal:no cyanosis of digits and no clubbing  PSYCH: alert & oriented x 3 with fluent speech NEURO: no focal motor/sensory deficits Breast: (+)3.5 x 5 cm mass behind right nipple and arora in UIQ no other palpable mass or adenopathy   LABORATORY DATA:  I have reviewed the data as listed CBC Latest Ref Rng & Units 06/18/2017 12/03/2014 05/02/2012  WBC 3.9 - 10.3 10e3/uL 3.6(L) 5.8 5.2  Hemoglobin 11.6 - 15.9 g/dL 13.3 12.3 11.3(L)  Hematocrit 34.8 - 46.6 %  40.9 36.9 33.2(L)  Platelets 145 - 400 10e3/uL 187 160 155    CMP Latest Ref Rng & Units 06/18/2017 12/03/2014 05/02/2012  Glucose 70 - 140 mg/dl 97 128(H) 90  BUN 7.0 - 26.0 mg/dL 14.'6 12 7  ' Creatinine 0.6 - 1.1 mg/dL 1.1 0.87 0.84  Sodium 136 - 145 mEq/L 140 134(L) 138  Potassium 3.5 - 5.1 mEq/L 4.5 3.7 3.4(L)  Chloride 96 - 112 mEq/L - 104 106  CO2 22 - 29 mEq/L '26 22 25  ' Calcium 8.4 - 10.4 mg/dL 9.4 8.2(L) 8.2(L)  Total Protein 6.4 - 8.3 g/dL 7.2 6.7 6.0  Total Bilirubin 0.20 - 1.20 mg/dL 0.46 0.8 0.5  Alkaline Phos 40 - 150 U/L 59 65 66  AST 5 - 34 U/L '11 22 15  ' ALT 0 - 55 U/L '7 10 11   ' PATHOLOGY  Diagnosis 06/11/17 Breast, right, needle core biopsy, 11:00 o'clock -  INVASIVE DUCTAL CARCINOMA, SEE COMMENT. Microscopic Comment The carcinoma appears grade 2. Prognostic markers will be ordered. Dr. Lyndon Code has reviewed the case. The case was called to [facility] on 06/12/2017. Results: HER2 - **POSITIVE** RATIO OF HER2/CEP17 SIGNALS 2.55 AVERAGE HER2 COPY NUMBER PER CELL 4.85   RADIOGRAPHIC STUDIES: I have personally reviewed the radiological images as listed and agreed with the findings in the report. US Breast Ltd Uni Right Inc Axilla  Result Date: 06/11/2017 CLINICAL DATA:  Palpable mass within the right breast, nipple retraction. This is patient's baseline mammogram. EXAM: 2D DIGITAL DIAGNOSTIC BILATERAL MAMMOGRAM WITH CAD AND ADJUNCT TOMO ULTRASOUND RIGHT BREAST COMPARISON:  None. ACR Breast Density Category c: The breast tissue is heterogeneously dense, which may obscure small masses. FINDINGS: There is a dense spiculated mass within the retroareolar right breast, centered within the slightly upper outer quadrant, with overlying nipple retraction and associated architectural distortion, measuring approximately 5.5 cm greatest dimension. There are no dominant masses, suspicious calcifications or secondary signs of malignancy within the left breast. Mammographic images were processed with CAD. Targeted ultrasound is performed, showing an irregular shadowing hypoechoic mass within the right breast, retroareolar, extending from the 8:00 to 11:00 axes, anterior to posterior depth, extending superficially to the nipple with there is associated nipple retraction, difficult to measure but measuring at least 5.5 cm greatest dimension. Right axilla was evaluated with ultrasound showing no enlarged or morphologically abnormal lymph nodes. IMPRESSION: 1. Highly suspicious mass within the retroareolar right breast, extending from the 8:00 to 11:00 axes, anterior to posterior depth, extending anteriorly to the nipple with associated nipple retraction,  measuring at least 5.5 cm by ultrasound and mammogram. Ultrasound-guided biopsy is recommended. 2. No sonographic evidence of metastatic lymphadenopathy in the right axilla. 3. No evidence of malignancy within the left breast. RECOMMENDATION: Ultrasound-guided biopsy of the right breast mass. Ultrasound-guided biopsy is scheduled for later today. I have discussed the findings and recommendations with the patient. Results were also provided in writing at the conclusion of the visit. If applicable, a reminder letter will be sent to the patient regarding the next appointment. BI-RADS CATEGORY  5: Highly suggestive of malignancy. Electronically Signed   By: Franki Cabot M.D.   On: 06/11/2017 09:56   Mm Diag Breast Tomo Bilateral  Result Date: 06/11/2017 CLINICAL DATA:  Palpable mass within the right breast, nipple retraction. This is patient's baseline mammogram. EXAM: 2D DIGITAL DIAGNOSTIC BILATERAL MAMMOGRAM WITH CAD AND ADJUNCT TOMO ULTRASOUND RIGHT BREAST COMPARISON:  None. ACR Breast Density Category c: The breast tissue is heterogeneously  dense, which may obscure small masses. FINDINGS: There is a dense spiculated mass within the retroareolar right breast, centered within the slightly upper outer quadrant, with overlying nipple retraction and associated architectural distortion, measuring approximately 5.5 cm greatest dimension. There are no dominant masses, suspicious calcifications or secondary signs of malignancy within the left breast. Mammographic images were processed with CAD. Targeted ultrasound is performed, showing an irregular shadowing hypoechoic mass within the right breast, retroareolar, extending from the 8:00 to 11:00 axes, anterior to posterior depth, extending superficially to the nipple with there is associated nipple retraction, difficult to measure but measuring at least 5.5 cm greatest dimension. Right axilla was evaluated with ultrasound showing no enlarged or morphologically abnormal  lymph nodes. IMPRESSION: 1. Highly suspicious mass within the retroareolar right breast, extending from the 8:00 to 11:00 axes, anterior to posterior depth, extending anteriorly to the nipple with associated nipple retraction, measuring at least 5.5 cm by ultrasound and mammogram. Ultrasound-guided biopsy is recommended. 2. No sonographic evidence of metastatic lymphadenopathy in the right axilla. 3. No evidence of malignancy within the left breast. RECOMMENDATION: Ultrasound-guided biopsy of the right breast mass. Ultrasound-guided biopsy is scheduled for later today. I have discussed the findings and recommendations with the patient. Results were also provided in writing at the conclusion of the visit. If applicable, a reminder letter will be sent to the patient regarding the next appointment. BI-RADS CATEGORY  5: Highly suggestive of malignancy. Electronically Signed   By: Franki Cabot M.D.   On: 06/11/2017 09:56   Mm Clip Placement Right  Result Date: 06/11/2017 CLINICAL DATA:  Post ultrasound-guided core needle biopsy of right breast 11 o'clock mass. EXAM: DIAGNOSTIC RIGHT MAMMOGRAM POST ULTRASOUND BIOPSY COMPARISON:  Previous exam(s). FINDINGS: Mammographic images were obtained following ultrasound guided biopsy of right breast 11 o'clock mass. Two-view mammography demonstrates presence of ribbon shaped marker in appropriate radiographic position. Expected post biopsy changes are seen. IMPRESSION: Successful placement of ribbon shaped marker post ultrasound-guided core needle biopsy of right breast mass. Final Assessment: Post Procedure Mammograms for Marker Placement Electronically Signed   By: Fidela Salisbury M.D.   On: 06/11/2017 11:09   [name] Rt Breast Bx W Loc Dev 1st Lesion Img Bx Spec US Guide  Addendum Date: 06/12/2017   ADDENDUM REPORT: 06/12/2017 11:56 ADDENDUM: Pathology revealed grade II invasive ductal carcinoma in the RIGHT breast. This was found to be concordant by Dr. Fidela Salisbury. Pathology results were discussed with the patient by telephone. The patient reported doing well after the biopsy. Post biopsy instructions and care were reviewed and questions were answered with tenderness at the site. The patient was encouraged to call [facility] for any additional concerns. The patient was referred to the [facility] at the [facility] on June 18, 2017. Pathology results reported by Susa Raring RN, BSN on 06/12/2017. Electronically Signed   By: Fidela Salisbury M.D.   On: 06/12/2017 11:56   Result Date: 06/12/2017 CLINICAL DATA:  Right 11 o'clock breast mass. EXAM: ULTRASOUND GUIDED RIGHT BREAST CORE NEEDLE BIOPSY COMPARISON:  Previous exam(s). FINDINGS: I met with the patient and we discussed the procedure of ultrasound-guided biopsy, including benefits and alternatives. We discussed the high likelihood of a successful procedure. We discussed the risks of the procedure, including infection, bleeding, tissue injury, clip migration, and inadequate sampling. Informed written consent was given. The usual time-out protocol was performed immediately prior to the procedure. Lesion quadrant: Subareolar/upper outer quadrant.  Using sterile technique and 1% Lidocaine as local anesthetic, under direct ultrasound visualization, a 14 gauge spring-loaded device was used to perform biopsy of right breast 11 o'clock mass using a lateral approach. At the conclusion of the procedure a ribbon shaped tissue marker clip was deployed into the biopsy cavity. Follow up 2 view mammogram was performed and dictated separately. IMPRESSION: Ultrasound guided biopsy of right breast. No apparent complications. Electronically Signed: By: Fidela Salisbury M.D. On: 06/11/2017 10:56    ASSESSMENT & PLAN:  Sheila [name] is a 41 y.o. premenopausal female with a history of Vitamin D deficiency and high cholesterol and kidney stones,  presented with a palpable right breast mass.    1. Malignant neoplasm of upper-outer quadrant of right breast in female, invasive ductal carcinoma, cT3N0M0, stage 1b, tripple positive, Grade 2 -We reviewed her mammogram and initial biopsy results.  -Due to the large size of her tumor and central location, she likely need mastectomy and sentinel lymph node biopsy. She was seen by surgeon [name] today, surgical options discussed, she is interested in reconstruction, she will be scheduled to see Dr. Iran Planas soon.  -We reviewed the risk of cancer recurrence after complete surgical resection. Giving the large size of tumor, HER-2 positive disease, she has high risk for recurrence. I recommend neoadjuvant or adjuvant chemotherapy to reduce her risk of recurrence. -I recommend CT and bone scan for staging, ruled out distant metastasis.  -We discussed at the benefit and the largest history of neoadjuvant versus adjuvant chemotherapy. Patient is unlikely a candidate for lumpectomy even after neoadjuvant chemotherapy, she would prefer to have surgery first with reconstruction, followed by adjuvant chemotherapy, so she has enough time to get her tissue expander expanded before her radiation. -Given the large size of tumor, I recommend adjuvant TCHP every 3 weeks for 6 cycles, followed by maintenance Herceptin with or without Perjeta for 6-12 months --Chemotherapy consent: Side effects including but does not not limited to, fatigue, nausea, vomiting, diarrhea, hair loss, neuropathy, fluid retention, renal and kidney dysfunction, neutropenic fever, needed for blood transfusion, bleeding, reversible heart failure, were discussed with patient in great detail. She agrees to proceed. -Given her ER/PR positive tumor, I recommend adjuvant antiestrogen therapy with tamoxifen, starting after her adjuvant irradiation. Benefit and the side effects reviewed with her, she is interested. -She will have her mastectomy  soon, then undergo chemo before radiation and tamoxifen.  -f/u 2 weeks after surgery    2. Genetics -Due to her family history of breast cancer I suggest she gets tested. She agrees -[facility]:  -Staging CT scan and bone scan before surgery -urgent genetic referral -She'll proceed right breast mastectomy and sentinel lymph node biopsy with immediate reconstruction as soon as possible. She will have a port placement during her breast surgery. -F/u 2 weeks after surgery, plan to have echocardiogram, chemotherapy class after her surgery, and start adjuvant chemotherapy TCHP in 4-5 weeks after surgery    All questions were answered. The patient knows to call the clinic with any problems, questions or concerns. I spent 55 minutes counseling the patient face to face. The total time spent in the appointment was 60 minutes and more than 50% was on counseling.  This document serves as a record of services personally performed by [name], MD. It was created on her behalf by Joslyn Devon, a trained medical scribe. The creation of this record is based on the scribe's personal observations and the provider's statements to  them. This document has been checked and approved by the attending provider.     [name], MD 06/18/2017 6:35 PM

## 2017-06-17 ENCOUNTER — Other Ambulatory Visit: Payer: Self-pay | Admitting: *Deleted

## 2017-06-17 DIAGNOSIS — C50411 Malignant neoplasm of upper-outer quadrant of right female breast: Secondary | ICD-10-CM

## 2017-06-17 DIAGNOSIS — Z170 Estrogen receptor positive status [ER+]: Principal | ICD-10-CM

## 2017-06-18 ENCOUNTER — Other Ambulatory Visit (HOSPITAL_BASED_OUTPATIENT_CLINIC_OR_DEPARTMENT_OTHER): Payer: PRIVATE HEALTH INSURANCE

## 2017-06-18 ENCOUNTER — Ambulatory Visit: Payer: PRIVATE HEALTH INSURANCE | Attending: General Surgery | Admitting: Physical Therapy

## 2017-06-18 ENCOUNTER — Ambulatory Visit (HOSPITAL_BASED_OUTPATIENT_CLINIC_OR_DEPARTMENT_OTHER): Payer: PRIVATE HEALTH INSURANCE | Admitting: Hematology

## 2017-06-18 ENCOUNTER — Encounter: Payer: Self-pay | Admitting: Physical Therapy

## 2017-06-18 ENCOUNTER — Encounter: Payer: Self-pay | Admitting: Hematology

## 2017-06-18 ENCOUNTER — Other Ambulatory Visit: Payer: Self-pay | Admitting: General Surgery

## 2017-06-18 ENCOUNTER — Encounter: Payer: Self-pay | Admitting: *Deleted

## 2017-06-18 ENCOUNTER — Ambulatory Visit
Admission: RE | Admit: 2017-06-18 | Discharge: 2017-06-18 | Disposition: A | Discharge status: Home / Self Care | Payer: PRIVATE HEALTH INSURANCE | Source: Ambulatory Visit | Attending: Radiation Oncology | Admitting: Radiation Oncology

## 2017-06-18 DIAGNOSIS — Z170 Estrogen receptor positive status [ER+]: Secondary | ICD-10-CM

## 2017-06-18 DIAGNOSIS — C50411 Malignant neoplasm of upper-outer quadrant of right female breast: Secondary | ICD-10-CM

## 2017-06-18 DIAGNOSIS — Z809 Family history of malignant neoplasm, unspecified: Secondary | ICD-10-CM

## 2017-06-18 DIAGNOSIS — E559 Vitamin D deficiency, unspecified: Secondary | ICD-10-CM | POA: Diagnosis not present

## 2017-06-18 DIAGNOSIS — E7800 Pure hypercholesterolemia, unspecified: Secondary | ICD-10-CM | POA: Diagnosis not present

## 2017-06-18 DIAGNOSIS — R293 Abnormal posture: Secondary | ICD-10-CM

## 2017-06-18 LAB — COMPREHENSIVE METABOLIC PANEL
ALT: 7 U/L (ref 0–55)
AST: 11 U/L (ref 5–34)
Albumin: 4.1 g/dL (ref 3.5–5.0)
Alkaline Phosphatase: 59 U/L (ref 40–150)
Anion Gap: 7 mEq/L (ref 3–11)
BILIRUBIN TOTAL: 0.46 mg/dL (ref 0.20–1.20)
BUN: 14.6 mg/dL (ref 7.0–26.0)
CO2: 26 meq/L (ref 22–29)
Calcium: 9.4 mg/dL (ref 8.4–10.4)
Chloride: 107 mEq/L (ref 98–109)
Creatinine: 1.1 mg/dL (ref 0.6–1.1)
EGFR: 66 mL/min/{1.73_m2} — ABNORMAL LOW (ref >90)
GLUCOSE: 97 mg/dL (ref 70–140)
Potassium: 4.5 mEq/L (ref 3.5–5.1)
SODIUM: 140 meq/L (ref 136–145)
TOTAL PROTEIN: 7.2 g/dL (ref 6.4–8.3)

## 2017-06-18 LAB — CBC WITH DIFFERENTIAL/PLATELET
BASO%: 0.8 % (ref 0.0–2.0)
Basophils Absolute: 0 10*3/uL (ref 0.0–0.1)
EOS%: 1.9 % (ref 0.0–7.0)
Eosinophils Absolute: 0.1 10*3/uL (ref 0.0–0.5)
HCT: 40.9 % (ref 34.8–46.6)
HGB: 13.3 g/dL (ref 11.6–15.9)
LYMPH%: 38 % (ref 14.0–49.7)
MCH: 31 pg (ref 25.1–34.0)
MCHC: 32.5 g/dL (ref 31.5–36.0)
MCV: 95.3 fL (ref 79.5–101.0)
MONO#: 0.2 10*3/uL (ref 0.1–0.9)
MONO%: 6.6 % (ref 0.0–14.0)
NEUT%: 52.7 % (ref 38.4–76.8)
NEUTROS ABS: 1.9 10*3/uL (ref 1.5–6.5)
PLATELETS: 187 10*3/uL (ref 145–400)
RBC: 4.29 10*6/uL (ref 3.70–5.45)
RDW: 12.9 % (ref 11.2–14.5)
WBC: 3.6 10*3/uL — AB (ref 3.9–10.3)
lymph#: 1.4 10*3/uL (ref 0.9–3.3)

## 2017-06-18 NOTE — Progress Notes (Signed)
Nutrition Assessment  Reason for Assessment:  Pt seen in Breast Clinic  ASSESSMENT:   41 year old female with new diagnosis of breast cancer.  Past medical history Vit D deficiency, kidney stones  Patient reports appetite has been decreased due to stress of new diagnosis  Medications:  reviewed  Labs: reviewed  Anthropometrics:   Height: 66 inches Weight: 160 lb 6.4 oz BMI: 25.9   NUTRITION DIAGNOSIS: Food and nutrition related knowledge deficit related to new diagnosis of breast cancer as evidenced by no prior need for nutrition related information.  INTERVENTION:   Discussed and provided packet of information regarding nutritional tips for breast cancer patients.  Questions answered.  Teachback method used.  Contact information provided and patient knows to contact me with questions/concerns.    MONITORING, EVALUATION, and GOAL: Pt will consume a healthy plant based diet to maintain lean body mass throughout treatment.   Sheila [name], Sheila [name], [place] Registered Dietitian [phone] (pager)

## 2017-06-18 NOTE — Patient Instructions (Signed)

## 2017-06-18 NOTE — Progress Notes (Signed)
Radiation Oncology         [phone] ________________________________  Name: Sheila [name]        MRN: [id number]  Date of Service: 06/18/2017 DOB: 07-18-1976  [id number], No Pcp Per  [name], MD     REFERRING PHYSICIAN: [name], MD   DIAGNOSIS: The encounter diagnosis was Malignant neoplasm of upper-outer quadrant of right breast in female, estrogen receptor positive ([place]).   HISTORY OF PRESENT ILLNESS: Sheila [name] is a 41 y.o. female seen in the multidisciplinary breast clinic for a new diagnosis of right breast cancer. She noted a palpable lump in the right breast as well as nipple and skin retraction. She underwent diagnostic mammogram and ultrasound revealing a 5.5 cm mass in the right breast. Ultrasound of the axilla was negative for adenopathy. She had a biopsy on 06/11/17 of the breast which revealed a grade 2, invasive ductal carcinoma, ER/PR positive, with HER2 amplified with a  2.5 ratio, and a Ki67 was 12%. She comes today for recommendations of care for her cancer.     PREVIOUS RADIATION THERAPY: No   PAST MEDICAL HISTORY:  Past Medical History:  Diagnosis Date  . Anxiety   . Kidney stone   . Vitamin D deficiency        PAST SURGICAL HISTORY: Past Surgical History:  Procedure Laterality Date  . kidney stone removal       FAMILY HISTORY:  Family History  Problem Relation Age of Onset  . Breast cancer Paternal Grandmother   . Colon cancer Paternal Grandmother   . Breast cancer Mother   . Prostate cancer Paternal Grandfather   . Leukemia Paternal Grandfather   . Lung cancer Maternal Uncle      SOCIAL HISTORY:  reports that she has never smoked. She does not have any smokeless tobacco history on file. She reports that she does not drink alcohol or use drugs. The patient is in a common law marriage and lives in [place]. She works for a [employer].   ALLERGIES: Patient has no known allergies.   MEDICATIONS:    Current Outpatient Prescriptions  Medication Sig Dispense Refill  . calcium carbonate (TUMS - DOSED IN MG ELEMENTAL CALCIUM) 500 MG chewable tablet Chew 1 tablet by mouth as needed for indigestion or heartburn.    . diphenhydramine-acetaminophen (TYLENOL PM) 25-500 MG TABS tablet Take 2 tablets by mouth at bedtime as needed.    . fluticasone (FLONASE) 50 MCG/ACT nasal spray Place 1 spray into both nostrils daily.    Marland Kitchen omeprazole (PRILOSEC) 20 MG capsule Take 20 mg by mouth daily.    . Vitamin D, Ergocalciferol, (DRISDOL) 50000 units CAPS capsule Take 50,000 Units by mouth once a week.  0   No current facility-administered medications for this encounter.      REVIEW OF SYSTEMS: On review of systems, the patient reports that she is doing well overall. She  denies any chest pain, shortness of breath, cough, fevers, chills, night sweats, unintended weight changes. She denies any bowel or bladder disturbances, and denies abdominal pain, nausea or vomiting. She denies any new musculoskeletal or joint aches or pains. A complete review of systems is obtained and is otherwise negative.     PHYSICAL EXAM:  Wt Readings from Last 3 Encounters:  06/18/17 160 lb 6.4 oz (72.8 kg)  10/23/16 154 lb (69.9 kg)   Temp Readings from Last 3 Encounters:  06/18/17 98.2 F (36.8 C) (Oral)  10/23/16 98.9 F (37.2 C) (Oral)  12/03/14 98.3 F (36.8 C) (Oral)   BP Readings from Last 3 Encounters:  06/18/17 (!) 111/55  10/23/16 112/74  12/04/14 (!) 91/45   Pulse Readings from Last 3 Encounters:  06/18/17 (!) 104  10/23/16 (!) 121  12/04/14 90    In general this is a well appearing caucasian female in no acute distress. She is alert and oriented x4 and appropriate throughout the examination. HEENT reveals that the patient is normocephalic, atraumatic. EOMs are intact. PERRLA. Skin is intact without any evidence of gross lesions. Cardiovascular exam reveals a regular rate and rhythm, no clicks rubs or  murmurs are auscultated. Chest is clear to auscultation bilaterally. Lymphatic assessment is performed and does not reveal any adenopathy in the cervical, supraclavicular, axillary, or inguinal chains. Bilateral breast exam reveals a large fixed breast mass on the right with nipple retraction and crusting. The left breast is negative for mass. No nipple bleeding or discharge is noted.  Abdomen has active bowel sounds in all quadrants and is intact. The abdomen is soft, non tender, non distended. Lower extremities are negative for pretibial pitting edema, deep calf tenderness, cyanosis or clubbing.   ECOG = 1  0 - Asymptomatic (Fully active, able to carry on all predisease activities without restriction)  1 - Symptomatic but completely ambulatory (Restricted in physically strenuous activity but ambulatory and able to carry out work of a light or sedentary nature. For example, light housework, office work)  2 - Symptomatic, <50% in bed during the day (Ambulatory and capable of all self care but unable to carry out any work activities. Up and about more than 50% of waking hours)  3 - Symptomatic, >50% in bed, but not bedbound (Capable of only limited self-care, confined to bed or chair 50% or more of waking hours)  4 - Bedbound (Completely disabled. Cannot carry on any self-care. Totally confined to bed or chair)  5 - Death   Eustace Pen MM, Creech RH, Tormey DC, et al. [phone]). "Toxicity and response criteria of the [facility]". [place] Oncol. 5 (6): 649-55    LABORATORY DATA:  Lab Results  Component Value Date   WBC 3.6 (L) 06/18/2017   HGB 13.3 06/18/2017   HCT 40.9 06/18/2017   MCV 95.3 06/18/2017   PLT 187 06/18/2017   Lab Results  Component Value Date   NA 140 06/18/2017   K 4.5 06/18/2017   CL 104 12/03/2014   CO2 26 06/18/2017   Lab Results  Component Value Date   ALT 7 06/18/2017   AST 11 06/18/2017   ALKPHOS 59 06/18/2017   BILITOT 0.46 06/18/2017       RADIOGRAPHY: US Breast Ltd Uni Right Inc Axilla  Result Date: 06/11/2017 CLINICAL DATA:  Palpable mass within the right breast, nipple retraction. This is patient's baseline mammogram. EXAM: 2D DIGITAL DIAGNOSTIC BILATERAL MAMMOGRAM WITH CAD AND ADJUNCT TOMO ULTRASOUND RIGHT BREAST COMPARISON:  None. ACR Breast Density Category c: The breast tissue is heterogeneously dense, which may obscure small masses. FINDINGS: There is a dense spiculated mass within the retroareolar right breast, centered within the slightly upper outer quadrant, with overlying nipple retraction and associated architectural distortion, measuring approximately 5.5 cm greatest dimension. There are no dominant masses, suspicious calcifications or secondary signs of malignancy within the left breast. Mammographic images were processed with CAD. Targeted ultrasound is performed, showing an irregular shadowing hypoechoic mass within the right breast, retroareolar, extending from the 8:00 to 11:00 axes, anterior to posterior depth,  extending superficially to the nipple with there is associated nipple retraction, difficult to measure but measuring at least 5.5 cm greatest dimension. Right axilla was evaluated with ultrasound showing no enlarged or morphologically abnormal lymph nodes. IMPRESSION: 1. Highly suspicious mass within the retroareolar right breast, extending from the 8:00 to 11:00 axes, anterior to posterior depth, extending anteriorly to the nipple with associated nipple retraction, measuring at least 5.5 cm by ultrasound and mammogram. Ultrasound-guided biopsy is recommended. 2. No sonographic evidence of metastatic lymphadenopathy in the right axilla. 3. No evidence of malignancy within the left breast. RECOMMENDATION: Ultrasound-guided biopsy of the right breast mass. Ultrasound-guided biopsy is scheduled for later today. I have discussed the findings and recommendations with the patient. Results were also provided in writing  at the conclusion of the visit. If applicable, a reminder letter will be sent to the patient regarding the next appointment. BI-RADS CATEGORY  5: Highly suggestive of malignancy. Electronically Signed   By: Franki Cabot M.D.   On: 06/11/2017 09:56   Mm Diag Breast Tomo Bilateral  Result Date: 06/11/2017 CLINICAL DATA:  Palpable mass within the right breast, nipple retraction. This is patient's baseline mammogram. EXAM: 2D DIGITAL DIAGNOSTIC BILATERAL MAMMOGRAM WITH CAD AND ADJUNCT TOMO ULTRASOUND RIGHT BREAST COMPARISON:  None. ACR Breast Density Category c: The breast tissue is heterogeneously dense, which may obscure small masses. FINDINGS: There is a dense spiculated mass within the retroareolar right breast, centered within the slightly upper outer quadrant, with overlying nipple retraction and associated architectural distortion, measuring approximately 5.5 cm greatest dimension. There are no dominant masses, suspicious calcifications or secondary signs of malignancy within the left breast. Mammographic images were processed with CAD. Targeted ultrasound is performed, showing an irregular shadowing hypoechoic mass within the right breast, retroareolar, extending from the 8:00 to 11:00 axes, anterior to posterior depth, extending superficially to the nipple with there is associated nipple retraction, difficult to measure but measuring at least 5.5 cm greatest dimension. Right axilla was evaluated with ultrasound showing no enlarged or morphologically abnormal lymph nodes. IMPRESSION: 1. Highly suspicious mass within the retroareolar right breast, extending from the 8:00 to 11:00 axes, anterior to posterior depth, extending anteriorly to the nipple with associated nipple retraction, measuring at least 5.5 cm by ultrasound and mammogram. Ultrasound-guided biopsy is recommended. 2. No sonographic evidence of metastatic lymphadenopathy in the right axilla. 3. No evidence of malignancy within the left breast.  RECOMMENDATION: Ultrasound-guided biopsy of the right breast mass. Ultrasound-guided biopsy is scheduled for later today. I have discussed the findings and recommendations with the patient. Results were also provided in writing at the conclusion of the visit. If applicable, a reminder letter will be sent to the patient regarding the next appointment. BI-RADS CATEGORY  5: Highly suggestive of malignancy. Electronically Signed   By: Franki Cabot M.D.   On: 06/11/2017 09:56   Mm Clip Placement Right  Result Date: 06/11/2017 CLINICAL DATA:  Post ultrasound-guided core needle biopsy of right breast 11 o'clock mass. EXAM: DIAGNOSTIC RIGHT MAMMOGRAM POST ULTRASOUND BIOPSY COMPARISON:  Previous exam(s). FINDINGS: Mammographic images were obtained following ultrasound guided biopsy of right breast 11 o'clock mass. Two-view mammography demonstrates presence of ribbon shaped marker in appropriate radiographic position. Expected post biopsy changes are seen. IMPRESSION: Successful placement of ribbon shaped marker post ultrasound-guided core needle biopsy of right breast mass. Final Assessment: Post Procedure Mammograms for Marker Placement Electronically Signed   By: Fidela Salisbury M.D.   On: 06/11/2017 11:09   [name] Rt Breast  Bx W Loc Dev 1st Lesion Img Bx Spec US Guide  Addendum Date: 06/12/2017   ADDENDUM REPORT: 06/12/2017 11:56 ADDENDUM: Pathology revealed grade II invasive ductal carcinoma in the RIGHT breast. This was found to be concordant by Dr. Fidela Salisbury. Pathology results were discussed with the patient by telephone. The patient reported doing well after the biopsy. Post biopsy instructions and care were reviewed and questions were answered with tenderness at the site. The patient was encouraged to call [facility] for any additional concerns. The patient was referred to the [facility] at [facility] on June 18, 2017. Pathology results reported by Susa Raring RN, BSN on 06/12/2017. Electronically Signed   By: Fidela Salisbury M.D.   On: 06/12/2017 11:56   Result Date: 06/12/2017 CLINICAL DATA:  Right 11 o'clock breast mass. EXAM: ULTRASOUND GUIDED RIGHT BREAST CORE NEEDLE BIOPSY COMPARISON:  Previous exam(s). FINDINGS: I met with the patient and we discussed the procedure of ultrasound-guided biopsy, including benefits and alternatives. We discussed the high likelihood of a successful procedure. We discussed the risks of the procedure, including infection, bleeding, tissue injury, clip migration, and inadequate sampling. Informed written consent was given. The usual time-out protocol was performed immediately prior to the procedure. Lesion quadrant: Subareolar/upper outer quadrant. Using sterile technique and 1% Lidocaine as local anesthetic, under direct ultrasound visualization, a 14 gauge spring-loaded device was used to perform biopsy of right breast 11 o'clock mass using a lateral approach. At the conclusion of the procedure a ribbon shaped tissue marker clip was deployed into the biopsy cavity. Follow up 2 view mammogram was performed and dictated separately. IMPRESSION: Ultrasound guided biopsy of right breast. No apparent complications. Electronically Signed: By: Fidela Salisbury M.D. On: 06/11/2017 10:56       IMPRESSION/PLAN: 1. 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 Dr. Lisbeth Renshaw discusses the pathology findings and reviews the nature of invasive ductal disease. The consensus from the breast conference included metastatic work up with CT C/A/P and Bone Scan. Her surgical plans would come next and she will proceed with mastectomy with sentinel mapping and evaluation for reconstruction with plastic surgery. Her course would be followed by adjuvant chemotherapy, then conclude therapy wtih external radiotherapy to the chest wall followed by antiestrogen  therapy. We discussed the risks, benefits, short, and long term effects of radiotherapy, and the patient is interested in proceeding. Dr. Lisbeth Renshaw discusses the delivery and logistics of radiotherapy and we would anticipate a course of 6 1/2 weeks to the chest wall and regional nodes. We will see her back following her completion of chemotherapy and would anticipate beginning treatment about 4 weeks after completing chemotherapy. 2. Possible genetic predisposition to malignancy. The patient will be referred for genetic counseling to discuss options for testing.  3. Contraception. The patient has been avoiding pregnancy and does not plan to become pregnant. Her husband assures that this will not become an issue during treatment.   The above documentation reflects my direct findings during this shared patient visit. Please see the separate note by Dr. Lisbeth Renshaw on this date for the remainder of the patient's plan of care.    Carola Rhine, PAC

## 2017-06-18 NOTE — Progress Notes (Signed)
Clinical Social Work [place] Psychosocial Distress Screening [place]  Patient completed distress screening protocol and scored a 5 on the Psychosocial Distress Thermometer which indicates moderate distress. Clinical Social Worker met with patient and patients family in [facility] to assess for distress and other psychosocial needs. Patient stated she was feeling overwhelmed but felt "better" after meeting with the treatment team and getting more information on her treatment plan. CSW and patient discussed common feeling and emotions when being diagnosed with cancer, and the importance of support during treatment. CSW informed patient of the support team and support services at [facility], and patient was agreeable to an [employer] referral. CSW provided contact information and encouraged patient to call with any questions or concerns.   ONCBCN DISTRESS SCREENING 06/18/2017  Screening Type Initial Screening  Distress experienced in past week (1-10) 5  Practical problem type Insurance  Emotional problem type Nervousness/Anxiety;Adjusting to illness;Feeling hopeless  Information Concerns Type Lack of info about diagnosis;Lack of info about treatment;Lack of info about complementary therapy choices;Lack of info about maintaining fitness  Physical Problem type Sleep/insomnia;Loss of appetitie  Physician notified of physical symptoms Yes  Referral to support programs Yes    Johnnye Lana, MSW, LCSW, OSW-C Clinical Social Worker [facility] [phone]

## 2017-06-18 NOTE — Therapy (Signed)
[place], [place], [address] Phone: [phone]   Fax:  [phone]  Physical Therapy Evaluation  Patient Details  Name: Toyoko Silos MRN: [id number] Date of Birth: 1976/09/22 Referring Provider: Dr. [name]  Encounter Date: 06/18/2017      PT End of Session - 06/18/17 1217    Visit Number 1   Number of Visits 1   PT Start Time 1020   PT Stop Time 1033  Also saw pt from 1100-1116 for a total of 29 minutes   PT Time Calculation (min) 13 min   Activity Tolerance Patient tolerated treatment well   Behavior During Therapy [facility] for tasks assessed/performed      Past Medical History:  Diagnosis Date  . Anxiety   . GERD (gastroesophageal reflux disease)   . Kidney stone   . Vitamin D deficiency     Past Surgical History:  Procedure Laterality Date  . kidney stone removal      There were no vitals filed for this visit.       Subjective Assessment - 06/18/17 1204    Subjective Patient reports she is here today to meet her medical team for her newly diagnosed right breast cancer.   Patient is accompained by: Family member   Pertinent History Patient was diagnosed on 06/11/17 with right triple positive grade 2 invasive ductal carcinoma breast cancer. It measures 5.5 cm and is located in the upper outer quadrant with a Ki67 of 12%.    Patient Stated Goals Reduce lymphedema risk and learn post op shoulder ROM HEP   Currently in Pain? No/denies            [facility] PT Assessment - 06/18/17 0001      Assessment   Medical Diagnosis Right breast cancer   Referring Provider Dr. [name]   Onset Date/Surgical Date 06/11/17   Hand Dominance Right   Prior Therapy none     Precautions   Precautions Other (comment)   Precaution Comments active cancer     Restrictions   Weight Bearing Restrictions No     Balance Screen   Has the patient fallen in the past 6 months Yes   How  many times? 1  Tripped on sidewalk; not a balance concern   Has the patient had a decrease in activity level because of a fear of falling?  No   Is the patient reluctant to leave their home because of a fear of falling?  No     Home Ecologist residence   Living Arrangements Spouse/significant other   Available Help at Discharge Family     Prior Function   Level of Independence Independent   Vocation Full time employment   Estate agent work; Therapist, art   Leisure She walks 10 minutes per day     Cognition   Overall Cognitive Status Within Functional Limits for tasks assessed     Posture/Postural Control   Posture/Postural Control Postural limitations   Postural Limitations Forward head;Rounded Shoulders     ROM / Strength   AROM / PROM / Strength AROM;Strength     AROM   AROM Assessment Site Shoulder;Cervical   Right/Left Shoulder Right;Left   Right Shoulder Extension 47 Degrees   Right Shoulder Flexion 155 Degrees   Right Shoulder ABduction 169 Degrees   Right Shoulder Internal Rotation 71 Degrees   Right Shoulder External Rotation 90 Degrees   Left Shoulder Extension 55 Degrees  Left Shoulder Flexion 155 Degrees   Left Shoulder ABduction 160 Degrees   Left Shoulder Internal Rotation 78 Degrees   Left Shoulder External Rotation 85 Degrees   Cervical Flexion WNL   Cervical Extension WNL   Cervical - Right Side Bend WNL   Cervical - Left Side Bend WNL   Cervical - Right Rotation WNL   Cervical - Left Rotation WNL     Strength   Overall Strength Within functional limits for tasks performed           LYMPHEDEMA/ONCOLOGY QUESTIONNAIRE - 06/18/17 1216      Type   Cancer Type Right breast cancer     Lymphedema Assessments   Lymphedema Assessments Upper extremities     Right Upper Extremity Lymphedema   10 cm Proximal to Olecranon Process 28 cm   Olecranon Process 24.3 cm   10 cm Proximal to Ulnar Styloid  Process 21.9 cm   Just Proximal to Ulnar Styloid Process 15.6 cm   Across Hand at [employer] 18.3 cm   At [place] of 2nd Digit 6.1 cm     Left Upper Extremity Lymphedema   10 cm Proximal to Olecranon Process 27.8 cm   Olecranon Process 24.5 cm   10 cm Proximal to Ulnar Styloid Process 20.7 cm   Just Proximal to Ulnar Styloid Process 15.7 cm   Across Hand at [employer] 19 cm   At [place] of 2nd Digit 6 cm         Objective measurements completed on examination: See above findings.        Patient was instructed today in a home exercise program today for post op shoulder range of motion. These included active assist shoulder flexion in sitting, scapular retraction, wall walking with shoulder abduction, and hands behind head external rotation.  She was encouraged to do these twice a day, holding 3 seconds and repeating 5 times when permitted by her physician.              PT Education - 06/18/17 1217    Education provided Yes   Education Details Lymphedema risk reduction and post op shoulder ROM HEP   Person(s) Educated Patient;Spouse   Methods Explanation;Demonstration;Handout   Comprehension Returned demonstration;Verbalized understanding              Breast Clinic Goals - 06/18/17 1221      Patient will be able to verbalize understanding of pertinent lymphedema risk reduction practices relevant to her diagnosis specifically related to skin care.   Time 1   Period Days   Status Achieved     Patient will be able to return demonstrate and/or verbalize understanding of the post-op home exercise program related to regaining shoulder range of motion.   Time 1   Period Days   Status Achieved     Patient will be able to verbalize understanding of the importance of attending the postoperative After Breast Cancer Class for further lymphedema risk reduction education and therapeutic exercise.   Time 1   Period Days   Status Achieved                Plan - 06/18/17 1218    Clinical Impression Statement Patient was diagnosed on 06/11/17 with right triple positive grade 2 invasive ductal carcinoma breast cancer. It measures 5.5 cm and is located in the upper outer quadrant with a Ki67 of 12%. Her multidisciplinary medical team met prior to her assessments to determine a recommended treatment plan.  She is planning to have a right mastectomy and sentinel node biopsy followed by chemotherapy, radiation, and anti-estrogen therapy.   History and Personal Factors relevant to plan of care: None   Clinical Presentation Stable   Clinical Presentation due to: Condition is stable   Clinical Decision Making Low   Rehab Potential Excellent   Clinical Impairments Affecting Rehab Potential None   PT Frequency One time visit   PT Treatment/Interventions Patient/family education;Therapeutic exercise   PT Next Visit Plan Will f/u after surgery to determine PT needs   PT Home Exercise Plan Post op shoulder ROM HEP   Consulted and Agree with Plan of Care Patient;Family member/caregiver   Family Member Consulted Husband      Patient will benefit from skilled therapeutic intervention in order to improve the following deficits and impairments:  Postural dysfunction, Decreased knowledge of precautions, Pain, Impaired UE functional use, Decreased range of motion  Visit Diagnosis: Carcinoma of upper-outer quadrant of right breast in female, estrogen receptor positive ([place]) - Plan: PT plan of care cert/re-cert  Abnormal posture - Plan: PT plan of care cert/re-cert   Patient will follow up at outpatient cancer rehab if needed following surgery.  If the patient requires physical therapy at that time, a specific plan will be dictated and sent to the referring physician for approval. The patient was educated today on appropriate basic range of motion exercises to begin post operatively and the importance of attending the After Breast Cancer class following surgery.   Patient was educated today on lymphedema risk reduction practices as it pertains to recommendations that will benefit the patient immediately following surgery.  She verbalized good understanding.  No additional physical therapy is indicated at this time.      Problem List Patient Active Problem List   Diagnosis Date Noted  . Malignant neoplasm of upper-outer quadrant of right breast in female, estrogen receptor positive ([place]) 06/16/2017    Annia Friendly, PT 06/18/17 12:23 PM  [place], [place], [address] Phone: [phone]   Fax:  [phone]  Name: Temple Ewart MRN: [id number] Date of Birth: 26-Jun-1976

## 2017-06-19 ENCOUNTER — Other Ambulatory Visit: Payer: PRIVATE HEALTH INSURANCE

## 2017-06-19 ENCOUNTER — Encounter: Payer: Self-pay | Admitting: Genetic Counselor

## 2017-06-19 ENCOUNTER — Ambulatory Visit (HOSPITAL_BASED_OUTPATIENT_CLINIC_OR_DEPARTMENT_OTHER): Payer: PRIVATE HEALTH INSURANCE | Admitting: Genetic Counselor

## 2017-06-19 DIAGNOSIS — C50411 Malignant neoplasm of upper-outer quadrant of right female breast: Secondary | ICD-10-CM

## 2017-06-19 DIAGNOSIS — Z170 Estrogen receptor positive status [ER+]: Secondary | ICD-10-CM

## 2017-06-19 DIAGNOSIS — Z7183 Encounter for nonprocreative genetic counseling: Secondary | ICD-10-CM

## 2017-06-19 DIAGNOSIS — Z803 Family history of malignant neoplasm of breast: Secondary | ICD-10-CM | POA: Insufficient documentation

## 2017-06-19 DIAGNOSIS — Z800 Family history of malignant neoplasm of digestive organs: Secondary | ICD-10-CM | POA: Diagnosis not present

## 2017-06-19 NOTE — Progress Notes (Signed)
REFERRING PROVIDER: [name], MD [place], [place] [address]  PRIMARY PROVIDER:  Patient, No Pcp Per  PRIMARY REASON FOR VISIT:  1. Malignant neoplasm of upper-outer quadrant of right breast in female, estrogen receptor positive ([place])   2. Family history of breast cancer   3. Family history of colon cancer      HISTORY OF PRESENT ILLNESS:   Ms. Nearhood, a 41 y.o. female, was seen for a [place] cancer genetics consultation at the request of Dr. Burr Medico due to a personal and family history of cancer.  Ms. Beidleman presents to clinic today to discuss the possibility of a hereditary predisposition to cancer, genetic testing, and to further clarify her future cancer risks, as well as potential cancer risks for family members.   In July 2018, at the age of 18, Ms. 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 This will be treated with mastectomy, chemotherapy, radiation and herceptin and tamoxifen.  The patient is very anxious and overwhelmed by this diagnosis.    CANCER HISTORY:  Oncology History   Cancer Staging Malignant neoplasm of upper-outer quadrant of right breast in female, estrogen receptor positive ([place]) Staging form: Breast, AJCC 8th Edition - Clinical stage from 06/11/2017: Stage IB (cT3, cN0, cM0, G2, ER: Positive, PR: Positive, HER2: Positive) - Signed by [name], MD on 06/16/2017       Malignant neoplasm of upper-outer quadrant of right breast in female, estrogen receptor positive ([place])   06/11/2017 Initial Biopsy    Diagnosis 06/11/17 Breast, right, needle core biopsy, 11:00 o'clock - INVASIVE DUCTAL CARCINOMA, G2      06/11/2017 Mammogram    [name] [facility] Diagnostic Breast Tomo Bilateral 06/11/17 IMPRESSION: 1. Highly suspicious mass within the retroareolar right breast, extending from the 8:00 to 11:00 axes, anterior to posterior depth, extending anteriorly to the nipple with associated nipple  retraction, measuring at least 5.5 cm by ultrasound  2. No sonographic evidence of metastatic lymphadenopathy in the right axilla. 3. No evidence of malignancy within the left breast.       06/11/2017 Initial Diagnosis    Malignant neoplasm of upper-outer quadrant of right breast in female, estrogen receptor positive ([place])      06/11/2017 Receptors her2    Estrogen Receptor: 100%, POSITIVE, STRONG STAINING INTENSITY Progesterone Receptor: 100%, POSITIVE, STRONG STAINING INTENSITY Proliferation Marker Ki67: 12%Proliferation Marker Ki67: 12%  HER2 - **POSITIVE** RATIO OF HER2/CEP17 SIGNALS 2.55 AVERAGE HER2 COPY NUMBER PER CELL 4.85        HORMONAL RISK FACTORS:  Menarche was at age 34.  First live birth at age N/A.  OCP use for approximately 20+ years years.  Ovaries intact: yes.  Hysterectomy: no.  Menopausal status: premenopausal.  HRT use: 0 years. Colonoscopy: no; not examined. Mammogram within the last year: yes. Number of breast biopsies: 1.   Past Medical History:  Diagnosis Date  . Anxiety   . Family history of breast cancer   . Family history of colon cancer   . GERD (gastroesophageal reflux disease)   . Kidney stone   . Vitamin D deficiency     Past Surgical History:  Procedure Laterality Date  . kidney stone removal      Social History   Social History  . Marital status: Single    Spouse name: N/A  . Number of children: N/A  . Years of education: N/A   Social History Main Topics  .  Smoking status: Never Smoker  . Smokeless tobacco: Former Systems developer  . Alcohol use No  . Drug use: No  . Sexual activity: Yes   Other Topics Concern  . None   Social History Narrative  . None     FAMILY HISTORY:  We obtained a detailed, 4-generation family history.  Significant diagnoses are listed below: Family History  Problem Relation Age of Onset  . Breast cancer Paternal Grandmother 43  . Colon cancer Paternal Grandmother 32  . Breast cancer Mother 32   . Prostate cancer Paternal Grandfather        dx in his 64s  . Leukemia Paternal Grandfather   . Lung cancer Maternal Uncle        heavy smoker  . Other Paternal Uncle        farm accident  . Stroke Maternal Grandfather     The patient does not have children.  She has a full sister and paternal half brother who are cancer free.  Her mother was diagnosed with breast cancer at 66 and her father has never had cancer.  Her mother has one sister and one brother.  The brother was a heavy smoker and died of lung cancer.  Both maternal grandparents are deceased.  The grandmother died in her 25's and the grandfather had a stroke in his 72's-60's.  The patient's father has a brother and sister.  The brother died in a farm accident.  Neither had cancer.  The patient's paternal grandmother had breast cancer at 84 and colon cancer around age 57.  Her grandfather had prostate cancer in his 14's and then adult onset leukemia.  He died of the leukemia.  Ms. Rottmann is unaware of previous family history of genetic testing for hereditary cancer risks. Patient's maternal ancestors are of [name] and [place] descent, and paternal ancestors are of [name] descent. There is no reported Ashkenazi Jewish ancestry. There is no known consanguinity.  GENETIC COUNSELING ASSESSMENT: Etsuko Dierolf is a 41 y.o. female with a personal history of breast cancer and family history of breast, colon and prostate cancer which is somewhat suggestive of a hereditary cancer syndrome and predisposition to cancer. We, therefore, discussed and recommended the following at today's visit.   DISCUSSION: We discussed that about 5-10% of breast cancer is hereditary with most cases due to BRCA mutations.  Other genes associated with hereditary cancer syndromes include ATM, CHEK2 and PALB2.  We reviewed the characteristics, features and inheritance patterns of hereditary cancer syndromes. We also discussed genetic testing, including the appropriate  family members to test, the process of testing, insurance coverage and turn-around-time for results. We discussed the implications of a negative, positive and/or variant of uncertain significant result. In order to get genetic test results in a timely manner so that Ms. Fodera can use these genetic test results for surgical decisions, we recommended Ms. Reder pursue genetic testing for the 9 gene STAT panel.The STAT Breast cancer panel offered by [facility] includes sequencing and rearrangement analysis for the following 9 genes:  ATM, BRCA1, BRCA2, CDH1, CHEK2, PALB2, PTEN, STK11 and TP53.  If this test is negative, we then recommend Ms. Spease pursue reflex genetic testing to the Common hereditary cancer gene panel. The Hereditary Gene Panel offered by [facility] includes sequencing and/or deletion duplication testing of the following 46 genes: APC, ATM, AXIN2, BARD1, BMPR1A, BRCA1, BRCA2, BRIP1, CDH1, CDKN2A (p14ARF), CDKN2A (p16INK4a), CHEK2, CTNNA1, DICER1, EPCAM (Deletion/duplication testing only), GREM1 (promoter region deletion/duplication testing only), KIT, MEN1, MLH1, MSH2,  MSH3, MSH6, MUTYH, NBN, NF1, NHTL1, PALB2, PDGFRA, PMS2, POLD1, POLE, PTEN, RAD50, RAD51C, RAD51D, SDHB, SDHC, SDHD, SMAD4, SMARCA4. STK11, TP53, TSC1, TSC2, and VHL.  The following genes were evaluated for sequence changes only: SDHA and HOXB13 c.251G>A variant only.   Based on Ms. Kozinski's personal and family history of cancer, she meets medical criteria for genetic testing. Despite that she meets criteria, she may still have an out of pocket cost. We discussed that if her out of pocket cost for testing is over $100, the laboratory will call and confirm whether she wants to proceed with testing.  If the out of pocket cost of testing is less than $100 she will be billed by the genetic testing laboratory.   PLAN: After considering the risks, benefits, and limitations, Ms. Macfadden  provided informed consent to pursue genetic testing and the  blood sample was sent to [facility] for analysis of the STAT panel, reflexing to the Common Hereditary cancer panel. Results should be available within approximately 7-10 days for the STAT panel and possibly an additional 2-3 weeks' time for the remainder of the panel, at which point they will be disclosed by telephone to Ms. Macfarlane, as will any additional recommendations warranted by these results. Ms. Oki will receive a summary of her genetic counseling visit and a copy of her results once available. This information will also be available in Epic. We encouraged Ms. Firestine to remain in contact with cancer genetics annually so that we can continuously update the family history and inform her of any changes in cancer genetics and testing that may be of benefit for her family. Ms. Godbey questions were answered to her satisfaction today. Our contact information was provided should additional questions or concerns arise.  Lastly, we encouraged Ms. Cardamone to remain in contact with cancer genetics annually so that we can continuously update the family history and inform her of any changes in cancer genetics and testing that may be of benefit for this family.   Ms.  Guardado questions were answered to her satisfaction today. Our contact information was provided should additional questions or concerns arise. Thank you for the referral and allowing [name] to share in the care of your patient.   Blaise Palladino P. Florene Glen, [place], [facility] Certified Genetic Counselor [email or web address] phone: [phone]  The patient was seen for a total of 30 minutes in face-to-face genetic counseling.  This patient was discussed with Drs. Magrinat, Lindi Adie and/or Burr Medico who agrees with the above.    _______________________________________________________________________ For Office Staff:  Number of people involved in session: 1 Was an Intern/ student involved with case: no

## 2017-06-23 ENCOUNTER — Telehealth: Payer: Self-pay | Admitting: *Deleted

## 2017-06-23 NOTE — Telephone Encounter (Signed)
Spoke with patient from [facility] 06/18/17.  Patient may want a second opinion at [facility].  She will let me know.

## 2017-06-25 ENCOUNTER — Encounter: Payer: Self-pay | Admitting: Genetic Counselor

## 2017-06-25 ENCOUNTER — Telehealth: Payer: Self-pay | Admitting: Genetic Counselor

## 2017-06-25 DIAGNOSIS — Z1379 Encounter for other screening for genetic and chromosomal anomalies: Secondary | ICD-10-CM | POA: Insufficient documentation

## 2017-06-25 NOTE — Telephone Encounter (Signed)
Revealed negative genetic testing.  Discussed that we do not know why she has breast cancer or why there is cancer in the family. It could be due to a different gene that we are not testing, or maybe our current technology may not be able to pick something up.  It will be important for her to keep in contact with genetics to keep up with whether additional testing may be needed.  We will reflex to the larger 46 gene panel, and will call her next week with those results.

## 2017-07-01 ENCOUNTER — Ambulatory Visit: Payer: Self-pay | Admitting: Genetic Counselor

## 2017-07-01 ENCOUNTER — Telehealth: Payer: Self-pay | Admitting: Genetic Counselor

## 2017-07-01 DIAGNOSIS — Z1379 Encounter for other screening for genetic and chromosomal anomalies: Secondary | ICD-10-CM

## 2017-07-01 DIAGNOSIS — Z803 Family history of malignant neoplasm of breast: Secondary | ICD-10-CM

## 2017-07-01 DIAGNOSIS — Z800 Family history of malignant neoplasm of digestive organs: Secondary | ICD-10-CM

## 2017-07-01 DIAGNOSIS — C50411 Malignant neoplasm of upper-outer quadrant of right female breast: Secondary | ICD-10-CM

## 2017-07-01 DIAGNOSIS — Z170 Estrogen receptor positive status [ER+]: Secondary | ICD-10-CM

## 2017-07-01 NOTE — Telephone Encounter (Signed)
Revealed negative genetic testing.  Discussed that we do not know why she has breast cancer or why there is cancer in the family. It could be due to a different gene that we are not testing, or maybe our current technology may not be able to pick something up.  It will be important for her to keep in contact with genetics to keep up with whether additional testing may be needed. 

## 2017-07-01 NOTE — Progress Notes (Signed)
HPI: Sheila [name] was previously seen in the [facility] due to a personal and family history of cancer and concerns regarding a hereditary predisposition to cancer. Please refer to our prior cancer genetics clinic note for more information regarding Sheila [name]'s medical, social and family histories, and our assessment and recommendations, at the time. Sheila [name] recent genetic test results were disclosed to her, as were recommendations warranted by these results. These results and recommendations are discussed in more detail below.  CANCER HISTORY:  Oncology History   Cancer Staging Malignant neoplasm of upper-outer quadrant of right breast in female, estrogen receptor positive ([place]) Staging form: Breast, AJCC 8th Edition - Clinical stage from 06/11/2017: Stage IB (cT3, cN0, cM0, G2, ER: Positive, PR: Positive, HER2: Positive) - Signed by [name], MD on 06/16/2017       Malignant neoplasm of upper-outer quadrant of right breast in female, estrogen receptor positive ([place])   06/11/2017 Initial Biopsy    Diagnosis 06/11/17 Breast, right, needle core biopsy, 11:00 o'clock - INVASIVE DUCTAL CARCINOMA, G2      06/11/2017 Mammogram    [name] and MM Diagnostic Breast Tomo Bilateral 06/11/17 IMPRESSION: 1. Highly suspicious mass within the retroareolar right breast, extending from the 8:00 to 11:00 axes, anterior to posterior depth, extending anteriorly to the nipple with associated nipple retraction, measuring at least 5.5 cm by ultrasound  2. No sonographic evidence of metastatic lymphadenopathy in the right axilla. 3. No evidence of malignancy within the left breast.       06/11/2017 Initial Diagnosis    Malignant neoplasm of upper-outer quadrant of right breast in female, estrogen receptor positive ([place])      06/11/2017 Receptors her2    Estrogen Receptor: 100%, POSITIVE, STRONG STAINING INTENSITY Progesterone Receptor: 100%, POSITIVE, STRONG STAINING  INTENSITY Proliferation Marker Ki67: 12%Proliferation Marker Ki67: 12%  HER2 - **POSITIVE** RATIO OF HER2/CEP17 SIGNALS 2.55 AVERAGE HER2 COPY NUMBER PER CELL 4.85      06/25/2017 Genetic Testing    Negative genetic testing on the 9 gene STAT panel.  The STAT Breast cancer panel offered by [facility] includes sequencing and rearrangement analysis for the following 9 genes:  ATM, BRCA1, BRCA2, CDH1, CHEK2, PALB2, PTEN, STK11 and TP53.   The report date is June 25, 2017.  Negative genetic testing on the common hereditary cancer panel.  The Hereditary Gene Panel offered by [facility] includes sequencing and/or deletion duplication testing of the following 46 genes: APC, ATM, AXIN2, BARD1, BMPR1A, BRCA1, BRCA2, BRIP1, CDH1, CDKN2A (p14ARF), CDKN2A (p16INK4a), CHEK2, CTNNA1, DICER1, EPCAM (Deletion/duplication testing only), GREM1 (promoter region deletion/duplication testing only), KIT, MEN1, MLH1, MSH2, MSH3, MSH6, MUTYH, NBN, NF1, NHTL1, PALB2, PDGFRA, PMS2, POLD1, POLE, PTEN, RAD50, RAD51C, RAD51D, SDHB, SDHC, SDHD, SMAD4, SMARCA4. STK11, TP53, TSC1, TSC2, and VHL.  The following genes were evaluated for sequence changes only: SDHA and HOXB13 c.251G>A variant only.  The report date is June 25, 2017.        FAMILY HISTORY:  We obtained a detailed, 4-generation family history.  Significant diagnoses are listed below: Family History  Problem Relation Age of Onset  . Breast cancer Paternal Grandmother 50  . Colon cancer Paternal Grandmother 53  . Breast cancer Mother 62  . Prostate cancer Paternal Grandfather        dx in his 72s  . Leukemia Paternal Grandfather   . Lung cancer Maternal Uncle        heavy smoker  . Other Paternal Uncle  farm accident  . Stroke Maternal Grandfather     The patient does not have children.  She has a full sister and paternal half brother who are cancer free.  Her mother was diagnosed with breast cancer at 82 and her father has never had cancer.  Her mother  has one sister and one brother.  The brother was a heavy smoker and died of lung cancer.  Both maternal grandparents are deceased.  The grandmother died in her 47's and the grandfather had a stroke in his 55's-60's.  The patient's father has a brother and sister.  The brother died in a farm accident.  Neither had cancer.  The patient's paternal grandmother had breast cancer at 34 and colon cancer around age 38.  Her grandfather had prostate cancer in his 59's and then adult onset leukemia.  He died of the leukemia.  Sheila [name] is unaware of previous family history of genetic testing for hereditary cancer risks. Patient's maternal ancestors are of [name] and [place] descent, and paternal ancestors are of [name] descent. There is no reported Ashkenazi Jewish ancestry. There is no known consanguinity.  GENETIC TEST RESULTS: Genetic testing reported out on June 25, 2017 through the Common Hereditary cancer panel found no deleterious mutations.  The Hereditary Gene Panel offered by [facility] includes sequencing and/or deletion duplication testing of the following 46 genes: APC, ATM, AXIN2, BARD1, BMPR1A, BRCA1, BRCA2, BRIP1, CDH1, CDKN2A (p14ARF), CDKN2A (p16INK4a), CHEK2, CTNNA1, DICER1, EPCAM (Deletion/duplication testing only), GREM1 (promoter region deletion/duplication testing only), KIT, MEN1, MLH1, MSH2, MSH3, MSH6, MUTYH, NBN, NF1, NHTL1, PALB2, PDGFRA, PMS2, POLD1, POLE, PTEN, RAD50, RAD51C, RAD51D, SDHB, SDHC, SDHD, SMAD4, SMARCA4. STK11, TP53, TSC1, TSC2, and VHL.  The following genes were evaluated for sequence changes only: SDHA and HOXB13 c.251G>A variant only.  The test report has been scanned into EPIC and is located under the Molecular Pathology section of the Results Review tab.   We discussed with Sheila [name] that since the current genetic testing is not perfect, it is possible there may be a gene mutation in one of these genes that current testing cannot detect, but that chance is small. We  also discussed, that it is possible that another gene that has not yet been discovered, or that we have not yet tested, is responsible for the cancer diagnoses in the family, and it is, therefore, important to remain in touch with cancer genetics in the future so that we can continue to offer Sheila [name] the most up to date genetic testing.   CANCER SCREENING RECOMMENDATIONS: This result is reassuring and indicates that Sheila [name] likely does not have an increased risk for a future cancer due to a mutation in one of these genes. This normal test also suggests that Sheila [name]'s cancer was most likely not due to an inherited predisposition associated with one of these genes.  Most cancers happen by chance and this negative test suggests that her cancer falls into this category.  We, therefore, recommended she continue to follow the cancer management and screening guidelines provided by her oncology and primary healthcare provider.   RECOMMENDATIONS FOR FAMILY MEMBERS: Women in this family might be at some increased risk of developing cancer, over the general population risk, simply due to the family history of cancer. We recommended women in this family have a yearly mammogram beginning at age 73, or 44 years younger than the earliest onset of cancer, an annual clinical breast exam, and perform monthly breast self-exams. Women in this family  should also have a gynecological exam as recommended by their primary provider. All family members should have a colonoscopy by age 94.  FOLLOW-UP: Lastly, we discussed with Sheila [name] that cancer genetics is a rapidly advancing field and it is possible that new genetic tests will be appropriate for her and/or her family members in the future. We encouraged her to remain in contact with cancer genetics on an annual basis so we can update her personal and family histories and let her know of advances in cancer genetics that may benefit this family.   Our contact number was  provided. Sheila [name] questions were answered to her satisfaction, and she knows she is welcome to call us at anytime with additional questions or concerns.   Roma Kayser, MS, [facility] Certified Genetic Counselor [email or web address]

## 2017-07-02 ENCOUNTER — Ambulatory Visit (HOSPITAL_COMMUNITY)
Admission: RE | Admit: 2017-07-02 | Discharge: 2017-07-02 | Disposition: A | Discharge status: Home / Self Care | Payer: PRIVATE HEALTH INSURANCE | Source: Ambulatory Visit | Attending: Hematology | Admitting: Hematology

## 2017-07-02 ENCOUNTER — Encounter (HOSPITAL_COMMUNITY): Payer: Self-pay

## 2017-07-02 DIAGNOSIS — C50411 Malignant neoplasm of upper-outer quadrant of right female breast: Secondary | ICD-10-CM | POA: Diagnosis present

## 2017-07-02 DIAGNOSIS — C50111 Malignant neoplasm of central portion of right female breast: Secondary | ICD-10-CM | POA: Insufficient documentation

## 2017-07-02 DIAGNOSIS — Z170 Estrogen receptor positive status [ER+]: Secondary | ICD-10-CM | POA: Insufficient documentation

## 2017-07-02 DIAGNOSIS — K769 Liver disease, unspecified: Secondary | ICD-10-CM | POA: Insufficient documentation

## 2017-07-02 DIAGNOSIS — M5137 Other intervertebral disc degeneration, lumbosacral region: Secondary | ICD-10-CM | POA: Diagnosis not present

## 2017-07-02 DIAGNOSIS — K5900 Constipation, unspecified: Secondary | ICD-10-CM | POA: Diagnosis not present

## 2017-07-02 DIAGNOSIS — D71 Functional disorders of polymorphonuclear neutrophils: Secondary | ICD-10-CM | POA: Insufficient documentation

## 2017-07-02 MED ORDER — IOPAMIDOL (ISOVUE-300) INJECTION 61%
100.0000 mL | Freq: Once | INTRAVENOUS | Status: AC | PRN
  Administered 2017-07-02: 100 mL via INTRAVENOUS

## 2017-07-02 MED ORDER — IOPAMIDOL (ISOVUE-300) INJECTION 61%
INTRAVENOUS | Status: AC
Start: 2017-07-02 — End: 2017-07-03
  Filled 2017-07-02: qty 100

## 2017-07-02 MED ORDER — TECHNETIUM TC 99M MEDRONATE IV KIT
20.0000 | PACK | Freq: Once | INTRAVENOUS | Status: AC | PRN
  Administered 2017-07-02: 20 via INTRAVENOUS

## 2017-07-04 ENCOUNTER — Other Ambulatory Visit: Payer: Self-pay | Admitting: General Surgery

## 2017-07-04 DIAGNOSIS — C50111 Malignant neoplasm of central portion of right female breast: Secondary | ICD-10-CM

## 2017-07-14 ENCOUNTER — Telehealth: Payer: Self-pay

## 2017-07-14 NOTE — Telephone Encounter (Signed)
Called patient concerning appointment for 9/17. Per los

## 2017-07-14 NOTE — Pre-Procedure Instructions (Signed)
Sheila [name]  [birth year]      [id number] [address] [place] [address] Phone: [phone] Fax: [phone]    Your procedure is scheduled on Tuesday September 4.  Report to [facility] Admitting at 1:15 P.M.  Call this number if you have problems the morning of surgery:  [phone]   Remember:  Do not eat food or drink liquids after midnight.  **DRINK 8oz BOOST BREEZE Drink 2 hours prior to arrival time to the hospital (by 11:15AM)   Take these medicines the morning of surgery with A SIP OF WATER: flonase if needed  7 days prior to surgery STOP taking any Aspirin, Aleve, Naproxen, Ibuprofen, Motrin, Advil, Goody's, BC's, all herbal medications, fish oil, and all vitamins    Do not wear jewelry, make-up or nail polish.  Do not wear lotions, powders, or perfumes, or deoderant.  Do not shave 48 hours prior to surgery.  Men may shave face and neck.  Do not bring valuables to the hospital.  [facility] is not responsible for any belongings or valuables.  Contacts, dentures or bridgework may not be worn into surgery.  Leave your suitcase in the car.  After surgery it may be brought to your room.  For patients admitted to the hospital, discharge time will be determined by your treatment team.  Patients discharged the day of surgery will not be allowed to drive home.    Special instructions:    [place]- Preparing For Surgery  Before surgery, you can play an important role. Because skin is not sterile, your skin needs to be as free of germs as possible. You can reduce the number of germs on your skin by washing with CHG (chlorahexidine gluconate) Soap before surgery.  CHG is an antiseptic cleaner which kills germs and bonds with the skin to continue killing germs even after washing.  Please do not use if you have an allergy to CHG or antibacterial soaps. If your skin becomes reddened/irritated stop using the  CHG.  Do not shave (including legs and underarms) for at least 48 hours prior to first CHG shower. It is OK to shave your face.  Please follow these instructions carefully.   1. Shower the NIGHT BEFORE SURGERY and the MORNING OF SURGERY with CHG.   2. If you chose to wash your hair, wash your hair first as usual with your normal shampoo.  3. After you shampoo, rinse your hair and body thoroughly to remove the shampoo.  4. Use CHG as you would any other liquid soap. You can apply CHG directly to the skin and wash gently with a scrungie or a clean washcloth.   5. Apply the CHG Soap to your body ONLY FROM THE NECK DOWN.  Do not use on open wounds or open sores. Avoid contact with your eyes, ears, mouth and genitals (private parts). Wash genitals (private parts) with your normal soap.  6. Wash thoroughly, paying special attention to the area where your surgery will be performed.  7. Thoroughly rinse your body with warm water from the neck down.  8. DO NOT shower/wash with your normal soap after using and rinsing off the CHG Soap.  9. Pat yourself dry with a CLEAN TOWEL.   10. Wear CLEAN PAJAMAS   11. Place CLEAN SHEETS on your bed the night of your first shower and DO NOT SLEEP WITH PETS.    Day of Surgery: Do not  apply any deodorants/lotions. Please wear clean clothes to the hospital/surgery center.      Please read over the following fact sheets that you were given. Coughing and Deep Breathing

## 2017-07-15 ENCOUNTER — Encounter (HOSPITAL_COMMUNITY): Payer: Self-pay

## 2017-07-15 ENCOUNTER — Encounter (HOSPITAL_COMMUNITY)
Admission: RE | Admit: 2017-07-15 | Discharge: 2017-07-15 | Disposition: A | Discharge status: Home / Self Care | Payer: PRIVATE HEALTH INSURANCE | Source: Ambulatory Visit | Attending: General Surgery | Admitting: General Surgery

## 2017-07-15 DIAGNOSIS — Z01818 Encounter for other preprocedural examination: Secondary | ICD-10-CM | POA: Diagnosis present

## 2017-07-15 HISTORY — DX: Malignant (primary) neoplasm, unspecified: C80.1

## 2017-07-15 HISTORY — DX: Headache: R51

## 2017-07-15 HISTORY — DX: Headache, unspecified: R51.9

## 2017-07-15 HISTORY — DX: Personal history of urinary calculi: Z87.442

## 2017-07-15 LAB — BASIC METABOLIC PANEL
ANION GAP: 8 (ref 5–15)
BUN: 12 mg/dL (ref 6–20)
CHLORIDE: 106 mmol/L (ref 101–111)
CO2: 25 mmol/L (ref 22–32)
Calcium: 9.6 mg/dL (ref 8.9–10.3)
Creatinine, Ser: 0.95 mg/dL (ref 0.44–1.00)
GFR calc non Af Amer: >60 mL/min (ref >60)
Glucose, Bld: 91 mg/dL (ref 65–99)
POTASSIUM: 4.6 mmol/L (ref 3.5–5.1)
SODIUM: 139 mmol/L (ref 135–145)

## 2017-07-15 LAB — CBC
HEMATOCRIT: 39.2 % (ref 36.0–46.0)
Hemoglobin: 12.8 g/dL (ref 12.0–15.0)
MCH: 30.6 pg (ref 26.0–34.0)
MCHC: 32.7 g/dL (ref 30.0–36.0)
MCV: 93.8 fL (ref 78.0–100.0)
Platelets: 194 10*3/uL (ref 150–400)
RBC: 4.18 MIL/uL (ref 3.87–5.11)
RDW: 13 % (ref 11.5–15.5)
WBC: 4.9 10*3/uL (ref 4.0–10.5)

## 2017-07-15 LAB — HCG, SERUM, QUALITATIVE: PREG SERUM: NEGATIVE

## 2017-07-15 MED ORDER — ENSURE PRE-SURGERY PO LIQD
592.0000 mL | Freq: Once | ORAL | Status: DC
  Filled 2017-07-15: qty 592

## 2017-07-15 MED ORDER — CHLORHEXIDINE GLUCONATE CLOTH 2 % EX PADS: 6.0000 | MEDICATED_PAD | Freq: Once | CUTANEOUS | Status: DC

## 2017-07-15 NOTE — Progress Notes (Signed)
PCP - [facility] as needed, no PCP  No cardiac hx or cardiac workup per patient.  Patient given ensure pre-op drink from pharmacy to take the night before surgery.    Patient denies shortness of breath, fever, cough and chest pain at PAT appointment   Patient verbalized understanding of instructions that were given to them at the PAT appointment. Patient was also instructed that they will need to review over the PAT instructions again at home before surgery.

## 2017-07-22 ENCOUNTER — Ambulatory Visit (HOSPITAL_COMMUNITY): Payer: PRIVATE HEALTH INSURANCE | Admitting: Anesthesiology

## 2017-07-22 ENCOUNTER — Ambulatory Visit (HOSPITAL_COMMUNITY)
Admission: RE | Admit: 2017-07-22 | Discharge: 2017-07-22 | Disposition: A | Discharge status: Home / Self Care | Payer: PRIVATE HEALTH INSURANCE | Source: Ambulatory Visit | Attending: General Surgery | Admitting: General Surgery

## 2017-07-22 ENCOUNTER — Encounter (HOSPITAL_COMMUNITY)
Admission: RE | Disposition: A | Discharge status: Home / Self Care | Payer: Self-pay | Source: Ambulatory Visit | Attending: General Surgery

## 2017-07-22 ENCOUNTER — Encounter (HOSPITAL_COMMUNITY): Payer: Self-pay | Admitting: Urology

## 2017-07-22 ENCOUNTER — Observation Stay (HOSPITAL_COMMUNITY)
Admission: RE | Admit: 2017-07-22 | Discharge: 2017-07-24 | Disposition: A | Discharge status: Home / Self Care | Payer: PRIVATE HEALTH INSURANCE | Source: Ambulatory Visit | Attending: General Surgery | Admitting: General Surgery

## 2017-07-22 ENCOUNTER — Observation Stay (HOSPITAL_COMMUNITY): Payer: PRIVATE HEALTH INSURANCE

## 2017-07-22 ENCOUNTER — Ambulatory Visit (HOSPITAL_COMMUNITY): Payer: PRIVATE HEALTH INSURANCE

## 2017-07-22 DIAGNOSIS — C50111 Malignant neoplasm of central portion of right female breast: Secondary | ICD-10-CM | POA: Diagnosis not present

## 2017-07-22 DIAGNOSIS — Z419 Encounter for procedure for purposes other than remedying health state, unspecified: Secondary | ICD-10-CM

## 2017-07-22 DIAGNOSIS — Z4001 Encounter for prophylactic removal of breast: Secondary | ICD-10-CM | POA: Diagnosis not present

## 2017-07-22 DIAGNOSIS — D242 Benign neoplasm of left breast: Secondary | ICD-10-CM | POA: Insufficient documentation

## 2017-07-22 DIAGNOSIS — K219 Gastro-esophageal reflux disease without esophagitis: Secondary | ICD-10-CM | POA: Diagnosis not present

## 2017-07-22 DIAGNOSIS — E7800 Pure hypercholesterolemia, unspecified: Secondary | ICD-10-CM | POA: Insufficient documentation

## 2017-07-22 DIAGNOSIS — R110 Nausea: Secondary | ICD-10-CM | POA: Insufficient documentation

## 2017-07-22 DIAGNOSIS — F329 Major depressive disorder, single episode, unspecified: Secondary | ICD-10-CM | POA: Insufficient documentation

## 2017-07-22 DIAGNOSIS — F419 Anxiety disorder, unspecified: Secondary | ICD-10-CM | POA: Diagnosis not present

## 2017-07-22 DIAGNOSIS — Z87442 Personal history of urinary calculi: Secondary | ICD-10-CM | POA: Insufficient documentation

## 2017-07-22 DIAGNOSIS — Z803 Family history of malignant neoplasm of breast: Secondary | ICD-10-CM | POA: Diagnosis not present

## 2017-07-22 DIAGNOSIS — C773 Secondary and unspecified malignant neoplasm of axilla and upper limb lymph nodes: Secondary | ICD-10-CM | POA: Diagnosis not present

## 2017-07-22 DIAGNOSIS — Z95828 Presence of other vascular implants and grafts: Secondary | ICD-10-CM

## 2017-07-22 DIAGNOSIS — Z170 Estrogen receptor positive status [ER+]: Secondary | ICD-10-CM | POA: Diagnosis not present

## 2017-07-22 DIAGNOSIS — Z79899 Other long term (current) drug therapy: Secondary | ICD-10-CM | POA: Diagnosis not present

## 2017-07-22 HISTORY — PX: MASTECTOMY W/ SENTINEL NODE BIOPSY: SHX2001

## 2017-07-22 HISTORY — PX: PORTACATH PLACEMENT: SHX2246

## 2017-07-22 SURGERY — MASTECTOMY WITH SENTINEL LYMPH NODE BIOPSY
Anesthesia: Regional | Site: Chest | Laterality: Right

## 2017-07-22 MED ORDER — MORPHINE SULFATE (PF) 4 MG/ML IV SOLN
2.0000 mg | INTRAVENOUS | Status: DC | PRN
  Administered 2017-07-22: 2 mg via INTRAVENOUS
  Filled 2017-07-22: qty 1

## 2017-07-22 MED ORDER — FLUTICASONE PROPIONATE 50 MCG/ACT NA SUSP
1.0000 | Freq: Every day | NASAL | Status: DC
Start: 2017-07-22 — End: 2017-07-24
  Administered 2017-07-22 – 2017-07-23 (×2): 1 via NASAL
  Filled 2017-07-22: qty 16

## 2017-07-22 MED ORDER — FENTANYL CITRATE (PF) 100 MCG/2ML IJ SOLN
INTRAMUSCULAR | Status: AC
  Filled 2017-07-22: qty 2

## 2017-07-22 MED ORDER — PHENYLEPHRINE HCL 10 MG/ML IJ SOLN
INTRAVENOUS | Status: DC | PRN
  Administered 2017-07-22: 40 ug/min via INTRAVENOUS

## 2017-07-22 MED ORDER — ACETAMINOPHEN 500 MG PO TABS
1000.0000 mg | ORAL_TABLET | Freq: Four times a day (QID) | ORAL | Status: DC
  Administered 2017-07-22 – 2017-07-24 (×5): 1000 mg via ORAL
  Filled 2017-07-22 (×6): qty 2

## 2017-07-22 MED ORDER — MIDAZOLAM HCL 5 MG/5ML IJ SOLN
INTRAMUSCULAR | Status: DC | PRN
  Administered 2017-07-22: 2 mg via INTRAVENOUS

## 2017-07-22 MED ORDER — MIDAZOLAM HCL 2 MG/2ML IJ SOLN
INTRAMUSCULAR | Status: AC
  Filled 2017-07-22: qty 2

## 2017-07-22 MED ORDER — HEPARIN SOD (PORK) LOCK FLUSH 100 UNIT/ML IV SOLN
INTRAVENOUS | Status: DC | PRN
  Administered 2017-07-22: 400 [IU]

## 2017-07-22 MED ORDER — EPHEDRINE SULFATE 50 MG/ML IJ SOLN
INTRAMUSCULAR | Status: DC | PRN
  Administered 2017-07-22: 10 mg via INTRAVENOUS

## 2017-07-22 MED ORDER — ONDANSETRON HCL 4 MG/2ML IJ SOLN
4.0000 mg | Freq: Four times a day (QID) | INTRAMUSCULAR | Status: DC | PRN
  Administered 2017-07-22: 4 mg via INTRAVENOUS

## 2017-07-22 MED ORDER — FENTANYL CITRATE (PF) 250 MCG/5ML IJ SOLN
INTRAMUSCULAR | Status: AC
  Filled 2017-07-22: qty 5

## 2017-07-22 MED ORDER — CELECOXIB 200 MG PO CAPS
200.0000 mg | ORAL_CAPSULE | ORAL | Status: AC
  Administered 2017-07-22: 200 mg via ORAL
  Filled 2017-07-22: qty 1

## 2017-07-22 MED ORDER — GABAPENTIN 300 MG PO CAPS
300.0000 mg | ORAL_CAPSULE | ORAL | Status: AC
  Administered 2017-07-22: 300 mg via ORAL
  Filled 2017-07-22: qty 1

## 2017-07-22 MED ORDER — CEFAZOLIN SODIUM-DEXTROSE 2-4 GM/100ML-% IV SOLN
2.0000 g | INTRAVENOUS | Status: AC
  Administered 2017-07-22: 2 g via INTRAVENOUS
  Filled 2017-07-22: qty 100

## 2017-07-22 MED ORDER — OXYCODONE HCL 5 MG/5ML PO SOLN: 5.0000 mg | Freq: Once | ORAL | Status: DC | PRN

## 2017-07-22 MED ORDER — MIDAZOLAM HCL 2 MG/2ML IJ SOLN
INTRAMUSCULAR | Status: AC
  Administered 2017-07-22: 2 mg via INTRAVENOUS
  Filled 2017-07-22: qty 2

## 2017-07-22 MED ORDER — LIDOCAINE HCL (CARDIAC) 20 MG/ML IV SOLN
INTRAVENOUS | Status: DC | PRN
  Administered 2017-07-22: 40 mg via INTRAVENOUS

## 2017-07-22 MED ORDER — SIMETHICONE 80 MG PO CHEW: 40.0000 mg | CHEWABLE_TABLET | Freq: Four times a day (QID) | ORAL | Status: DC | PRN

## 2017-07-22 MED ORDER — FENTANYL CITRATE (PF) 100 MCG/2ML IJ SOLN
25.0000 ug | INTRAMUSCULAR | Status: DC | PRN
  Administered 2017-07-22 (×2): 50 ug via INTRAVENOUS

## 2017-07-22 MED ORDER — SODIUM CHLORIDE 0.9 % IV SOLN
INTRAVENOUS | Status: DC
  Administered 2017-07-22 – 2017-07-24 (×2): via INTRAVENOUS

## 2017-07-22 MED ORDER — MIDAZOLAM HCL 2 MG/2ML IJ SOLN
2.0000 mg | Freq: Once | INTRAMUSCULAR | Status: AC
  Administered 2017-07-22: 2 mg via INTRAVENOUS
  Filled 2017-07-22: qty 2

## 2017-07-22 MED ORDER — ONDANSETRON 4 MG PO TBDP: 4.0000 mg | ORAL_TABLET | Freq: Four times a day (QID) | ORAL | Status: DC | PRN

## 2017-07-22 MED ORDER — HEPARIN SOD (PORK) LOCK FLUSH 100 UNIT/ML IV SOLN
INTRAVENOUS | Status: AC
  Filled 2017-07-22: qty 5

## 2017-07-22 MED ORDER — FENTANYL CITRATE (PF) 100 MCG/2ML IJ SOLN
50.0000 ug | Freq: Once | INTRAMUSCULAR | Status: AC
  Administered 2017-07-22: 50 ug via INTRAVENOUS

## 2017-07-22 MED ORDER — LACTATED RINGERS IV SOLN
INTRAVENOUS | Status: DC
  Administered 2017-07-22 (×2): via INTRAVENOUS

## 2017-07-22 MED ORDER — DEXAMETHASONE SODIUM PHOSPHATE 10 MG/ML IJ SOLN
INTRAMUSCULAR | Status: DC | PRN
  Administered 2017-07-22: 10 mg via INTRAVENOUS

## 2017-07-22 MED ORDER — PANTOPRAZOLE SODIUM 40 MG PO TBEC
40.0000 mg | DELAYED_RELEASE_TABLET | Freq: Every day | ORAL | Status: DC
  Administered 2017-07-23: 40 mg via ORAL
  Filled 2017-07-22: qty 1

## 2017-07-22 MED ORDER — PROPOFOL 10 MG/ML IV BOLUS
INTRAVENOUS | Status: DC | PRN
  Administered 2017-07-22: 40 mg via INTRAVENOUS
  Administered 2017-07-22: 160 mg via INTRAVENOUS

## 2017-07-22 MED ORDER — PROPOFOL 10 MG/ML IV BOLUS
INTRAVENOUS | Status: AC
  Filled 2017-07-22: qty 20

## 2017-07-22 MED ORDER — OXYCODONE HCL 5 MG PO TABS: 5.0000 mg | ORAL_TABLET | Freq: Once | ORAL | Status: DC | PRN

## 2017-07-22 MED ORDER — LIDOCAINE 2% (20 MG/ML) 5 ML SYRINGE
INTRAMUSCULAR | Status: AC
  Filled 2017-07-22: qty 5

## 2017-07-22 MED ORDER — TECHNETIUM TC 99M SULFUR COLLOID FILTERED
1.0000 | Freq: Once | INTRAVENOUS | Status: AC | PRN
  Administered 2017-07-22: 1 via INTRADERMAL

## 2017-07-22 MED ORDER — SODIUM CHLORIDE 0.9 % IV SOLN
INTRAVENOUS | Status: DC | PRN
  Administered 2017-07-22: 500 mL

## 2017-07-22 MED ORDER — BUPIVACAINE-EPINEPHRINE (PF) 0.25% -1:200000 IJ SOLN
INTRAMUSCULAR | Status: DC | PRN
  Administered 2017-07-22: 8 mL

## 2017-07-22 MED ORDER — 0.9 % SODIUM CHLORIDE (POUR BTL) OPTIME
TOPICAL | Status: DC | PRN
  Administered 2017-07-22: 1000 mL

## 2017-07-22 MED ORDER — FENTANYL CITRATE (PF) 100 MCG/2ML IJ SOLN
INTRAMUSCULAR | Status: DC | PRN
Start: 2017-07-22 — End: 2017-07-22
  Administered 2017-07-22: 50 ug via INTRAVENOUS
  Administered 2017-07-22: 100 ug via INTRAVENOUS
  Administered 2017-07-22 (×2): 50 ug via INTRAVENOUS

## 2017-07-22 MED ORDER — OXYCODONE HCL 5 MG PO TABS
5.0000 mg | ORAL_TABLET | ORAL | Status: DC | PRN
  Administered 2017-07-23 (×2): 10 mg via ORAL
  Filled 2017-07-22 (×2): qty 2

## 2017-07-22 MED ORDER — FENTANYL CITRATE (PF) 100 MCG/2ML IJ SOLN
INTRAMUSCULAR | Status: AC
  Administered 2017-07-22: 50 ug via INTRAVENOUS
  Filled 2017-07-22: qty 2

## 2017-07-22 MED ORDER — BUPIVACAINE HCL (PF) 0.5 % IJ SOLN
INTRAMUSCULAR | Status: AC
  Filled 2017-07-22: qty 30

## 2017-07-22 MED ORDER — ONDANSETRON HCL 4 MG/2ML IJ SOLN: 4.0000 mg | Freq: Once | INTRAMUSCULAR | Status: DC | PRN

## 2017-07-22 MED ORDER — METHOCARBAMOL 500 MG PO TABS
500.0000 mg | ORAL_TABLET | Freq: Four times a day (QID) | ORAL | Status: DC | PRN
  Administered 2017-07-22: 500 mg via ORAL
  Filled 2017-07-22: qty 1

## 2017-07-22 MED ORDER — ONDANSETRON HCL 4 MG/2ML IJ SOLN
INTRAMUSCULAR | Status: DC | PRN
Start: 2017-07-22 — End: 2017-07-22
  Administered 2017-07-22 (×2): 4 mg via INTRAVENOUS

## 2017-07-22 MED ORDER — PHENYLEPHRINE HCL 10 MG/ML IJ SOLN
INTRAMUSCULAR | Status: DC | PRN
  Administered 2017-07-22: 120 ug via INTRAVENOUS
  Administered 2017-07-22 (×2): 160 ug via INTRAVENOUS

## 2017-07-22 MED ORDER — ONDANSETRON HCL 4 MG/2ML IJ SOLN
INTRAMUSCULAR | Status: AC
  Filled 2017-07-22: qty 2

## 2017-07-22 MED ORDER — BUPIVACAINE-EPINEPHRINE (PF) 0.25% -1:200000 IJ SOLN
INTRAMUSCULAR | Status: AC
  Filled 2017-07-22: qty 30

## 2017-07-22 MED ORDER — ACETAMINOPHEN 500 MG PO TABS
1000.0000 mg | ORAL_TABLET | ORAL | Status: AC
  Administered 2017-07-22: 1000 mg via ORAL
  Filled 2017-07-22: qty 2

## 2017-07-22 SURGICAL SUPPLY — 77 items
APPLIER CLIP 9.375 MED OPEN (MISCELLANEOUS) ×3
BAG DECANTER FOR FLEXI CONT (MISCELLANEOUS) ×3 IMPLANT
BINDER BREAST LRG (GAUZE/BANDAGES/DRESSINGS) ×3 IMPLANT
BIOPATCH RED 1 DISK 7.0 (GAUZE/BANDAGES/DRESSINGS) ×9 IMPLANT
BLADE SURG 11 STRL SS (BLADE) ×3 IMPLANT
BLADE SURG 15 STRL LF DISP TIS (BLADE) ×2 IMPLANT
BLADE SURG 15 STRL SS (BLADE) ×1
CANISTER SUCT 3000ML PPV (MISCELLANEOUS) ×3 IMPLANT
CHLORAPREP W/TINT 26ML (MISCELLANEOUS) ×6 IMPLANT
CLIP APPLIE 9.375 MED OPEN (MISCELLANEOUS) ×2 IMPLANT
COVER PROBE W GEL 5X96 (DRAPES) ×3 IMPLANT
COVER SURGICAL LIGHT HANDLE (MISCELLANEOUS) ×3 IMPLANT
COVER TRANSDUCER ULTRASND GEL (DRAPE) ×3 IMPLANT
CRADLE DONUT ADULT HEAD (MISCELLANEOUS) ×3 IMPLANT
DECANTER SPIKE VIAL GLASS SM (MISCELLANEOUS) ×3 IMPLANT
DERMABOND ADVANCED (GAUZE/BANDAGES/DRESSINGS) ×4
DERMABOND ADVANCED .7 DNX12 (GAUZE/BANDAGES/DRESSINGS) ×8 IMPLANT
DRAIN CHANNEL 19F RND (DRAIN) ×9 IMPLANT
DRAPE C-ARM 42X72 X-RAY (DRAPES) ×3 IMPLANT
DRAPE CHEST BREAST 15X10 FENES (DRAPES) ×3 IMPLANT
DRAPE LAPAROSCOPIC ABDOMINAL (DRAPES) ×3 IMPLANT
DRAPE UTILITY XL STRL (DRAPES) ×3 IMPLANT
DRSG TEGADERM 4X4.75 (GAUZE/BANDAGES/DRESSINGS) ×6 IMPLANT
ELECT BLADE 4.0 EZ CLEAN MEGAD (MISCELLANEOUS) ×3
ELECT CAUTERY BLADE 6.4 (BLADE) ×3 IMPLANT
ELECT REM PT RETURN 9FT ADLT (ELECTROSURGICAL) ×3
ELECTRODE BLDE 4.0 EZ CLN MEGD (MISCELLANEOUS) ×2 IMPLANT
ELECTRODE REM PT RTRN 9FT ADLT (ELECTROSURGICAL) ×2 IMPLANT
EVACUATOR SILICONE 100CC (DRAIN) ×9 IMPLANT
GEL ULTRASOUND 20GR AQUASONIC (MISCELLANEOUS) ×3 IMPLANT
GLOVE BIO SURGEON STRL SZ 6 (GLOVE) ×3 IMPLANT
GLOVE BIO SURGEON STRL SZ7 (GLOVE) ×6 IMPLANT
GLOVE BIOGEL PI IND STRL 6.5 (GLOVE) ×2 IMPLANT
GLOVE BIOGEL PI IND STRL 7.0 (GLOVE) ×2 IMPLANT
GLOVE BIOGEL PI IND STRL 7.5 (GLOVE) ×2 IMPLANT
GLOVE BIOGEL PI INDICATOR 6.5 (GLOVE) ×1
GLOVE BIOGEL PI INDICATOR 7.0 (GLOVE) ×1
GLOVE BIOGEL PI INDICATOR 7.5 (GLOVE) ×1
GLOVE SURG SS PI 6.5 STRL IVOR (GLOVE) ×6 IMPLANT
GOWN STRL REUS W/ TWL LRG LVL3 (GOWN DISPOSABLE) ×6 IMPLANT
GOWN STRL REUS W/TWL LRG LVL3 (GOWN DISPOSABLE) ×3
KIT BASIN OR (CUSTOM PROCEDURE TRAY) ×3 IMPLANT
KIT PORT POWER 8FR ISP CVUE (Miscellaneous) ×3 IMPLANT
KIT ROOM TURNOVER OR (KITS) ×3 IMPLANT
MARKER SKIN DUAL TIP RULER LAB (MISCELLANEOUS) ×3 IMPLANT
NEEDLE HYPO 25GX1X1/2 BEV (NEEDLE) ×3 IMPLANT
NS IRRIG 1000ML POUR BTL (IV SOLUTION) ×3 IMPLANT
PACK GENERAL/GYN (CUSTOM PROCEDURE TRAY) ×3 IMPLANT
PAD ARMBOARD 7.5X6 YLW CONV (MISCELLANEOUS) ×3 IMPLANT
PENCIL BUTTON HOLSTER BLD 10FT (ELECTRODE) ×3 IMPLANT
PIN SAFETY STERILE (MISCELLANEOUS) ×3 IMPLANT
SPECIMEN JAR X LARGE (MISCELLANEOUS) ×3 IMPLANT
SPONGE LAP 18X18 X RAY DECT (DISPOSABLE) ×3 IMPLANT
STAPLER VISISTAT 35W (STAPLE) ×3 IMPLANT
STRIP CLOSURE SKIN 1/2X4 (GAUZE/BANDAGES/DRESSINGS) ×6 IMPLANT
SUT ETHILON 2 0 FS 18 (SUTURE) ×3 IMPLANT
SUT ETHILON 3 0 PS 1 (SUTURE) ×3 IMPLANT
SUT MNCRL AB 4-0 PS2 18 (SUTURE) ×6 IMPLANT
SUT MON AB 4-0 PC3 18 (SUTURE) ×3 IMPLANT
SUT PROLENE 2 0 SH DA (SUTURE) ×3 IMPLANT
SUT SILK 2 0 (SUTURE)
SUT SILK 2 0 SH (SUTURE) ×3 IMPLANT
SUT SILK 2-0 18XBRD TIE 12 (SUTURE) IMPLANT
SUT VIC AB 2-0 SH 27 (SUTURE) ×1
SUT VIC AB 2-0 SH 27XBRD (SUTURE) ×2 IMPLANT
SUT VIC AB 3-0 54X BRD REEL (SUTURE) ×2 IMPLANT
SUT VIC AB 3-0 BRD 54 (SUTURE) ×1
SUT VIC AB 3-0 SH 18 (SUTURE) ×3 IMPLANT
SUT VIC AB 3-0 SH 27 (SUTURE) ×1
SUT VIC AB 3-0 SH 27XBRD (SUTURE) ×2 IMPLANT
SUT VIC AB 3-0 SH 8-18 (SUTURE) ×6 IMPLANT
SYR 20ML ECCENTRIC (SYRINGE) ×6 IMPLANT
SYR 5ML LUER SLIP (SYRINGE) ×3 IMPLANT
SYR CONTROL 10ML LL (SYRINGE) ×3 IMPLANT
TOWEL OR 17X24 6PK STRL BLUE (TOWEL DISPOSABLE) ×3 IMPLANT
TOWEL OR 17X26 10 PK STRL BLUE (TOWEL DISPOSABLE) ×3 IMPLANT
YANKAUER SUCT BULB TIP NO VENT (SUCTIONS) ×3 IMPLANT

## 2017-07-22 NOTE — Anesthesia Procedure Notes (Signed)
Procedure Name: LMA Insertion Date/Time: 07/22/2017 4:14 PM Performed by: Lance Coon Pre-anesthesia Checklist: Patient identified, Emergency Drugs available, Suction available, Patient being monitored and Timeout performed Patient Re-evaluated:Patient Re-evaluated prior to induction Oxygen Delivery Method: Circle system utilized Preoxygenation: Pre-oxygenation with 100% oxygen Induction Type: IV induction LMA: LMA inserted LMA Size: 4.0 Number of attempts: 1 Placement Confirmation: positive ETCO2 and breath sounds checked- equal and bilateral Tube secured with: Tape Dental Injury: Teeth and Oropharynx as per pre-operative assessment

## 2017-07-22 NOTE — Brief Op Note (Signed)
07/22/2017  6:27 PM  PATIENT:  Sheila [name]  41 y.o. female  PRE-OPERATIVE DIAGNOSIS:  RIGHT BREAST CANCER  POST-OPERATIVE DIAGNOSIS:  RIGHT BREAST CANCER  PROCEDURE:  Procedure(s): RIGHT TOTAL MASTECTOMY WITH RIGHT AXILLARY SENTINEL LYMPH NODE BIOPSY, LEFT PROPHYLACTIC MASTECTOMY (Bilateral) INSERTION PORT-A-CATH WITH [name] (Right)  SURGEON:  Surgeon(s) and Role:    * [name], MD - Primary    * Irene Limbo, MD - Assisting  PHYSICIAN ASSISTANT:   ASSISTANTS: none   ANESTHESIA:   general with pec block  EBL:  Total I/O In: 1500 [I.V.:1500] Out: 200 [Blood:200]  BLOOD ADMINISTERED:none  DRAINS: (19 Fr) Blake drain(s) in the bilateral mastectomy spaces, 2 on right and one on left   LOCAL MEDICATIONS USED:  MARCAINE     SPECIMEN:  Simple Mastectomy  DISPOSITION OF SPECIMEN:  PATHOLOGY  COUNTS:  YES  TOURNIQUET:  * No tourniquets in log *  DICTATION: .Dragon Dictation  PLAN OF CARE: Admit for overnight observation  PATIENT DISPOSITION:  PACU - hemodynamically stable.   Delay start of Pharmacological VTE agent (>24hrs) due to surgical blood loss or risk of bleeding: no

## 2017-07-22 NOTE — Interval H&P Note (Signed)
History and Physical Interval Note:  07/22/2017 1:37 PM  Sheila [name]  has presented today for surgery, with the diagnosis of RIGHT BREAST CANCER  The various methods of treatment have been discussed with the patient and family. After consideration of risks, benefits and other options for treatment, the patient has consented to  Procedure(s): RIGHT TOTAL MASTECTOMY WITH RIGHT AXILLARY SENTINEL LYMPH NODE BIOPSY, LEFT PROPHYLACTIC MASTECTOMY (Bilateral) INSERTION PORT-A-CATH WITH [name] (N/A) as a surgical intervention .  The patient's history has been reviewed, patient examined, no change in status, stable for surgery.  I have reviewed the patient's chart and labs.  Questions were answered to the patient's satisfaction.     Emmette Katt

## 2017-07-22 NOTE — Anesthesia Procedure Notes (Signed)
Anesthesia Regional Block: Pectoralis block   Pre-Anesthetic Checklist: ,, timeout performed, Correct Patient, Correct Site, Correct Laterality, Correct Procedure, Correct Position, site marked, Risks and benefits discussed,  Surgical consent,  Pre-op evaluation,  At surgeon's request and post-op pain management  Laterality: Left  Prep: chloraprep       Needles:  Injection technique: Single-shot  Needle Type: Echogenic Stimulator Needle     Needle Length: 9cm  Needle Gauge: 22     Additional Needles:   Procedures: ultrasound guided,,,,,,,,  Narrative:  Start time: 07/22/2017 2:33 PM End time: 07/22/2017 2:38 PM Injection made incrementally with aspirations every 5 mL.  Performed by: Personally  Anesthesiologist: Renner Sebald  Additional Notes: 20 cc 0.5% Bupivacaine with 1:200 epi injected easily

## 2017-07-22 NOTE — Anesthesia Procedure Notes (Signed)
Anesthesia Regional Block: Pectoralis block   Pre-Anesthetic Checklist: ,, timeout performed, Correct Patient, Correct Site, Correct Laterality, Correct Procedure, Correct Position, site marked, Risks and benefits discussed,  Surgical consent,  Pre-op evaluation,  At surgeon's request and post-op pain management  Laterality: Right  Prep: chloraprep       Needles:  Injection technique: Single-shot  Needle Type: Echogenic Stimulator Needle     Needle Length: 9cm  Needle Gauge: 22     Additional Needles:   Procedures: ultrasound guided,,,,,,,,  Narrative:  Start time: 07/22/2017 2:38 PM End time: 07/22/2017 2:44 PM Injection made incrementally with aspirations every 5 mL.  Performed by: Personally  Anesthesiologist: Cliffard Hair  Additional Notes: 20 cc 0.5% Bupivacaine with 1:200 epi injected easily

## 2017-07-22 NOTE — Transfer of Care (Signed)
Immediate Anesthesia Transfer of Care Note  Patient: Sheila [name]  Procedure(s) Performed: Procedure(s): RIGHT TOTAL MASTECTOMY WITH RIGHT AXILLARY SENTINEL LYMPH NODE BIOPSY, LEFT PROPHYLACTIC MASTECTOMY (Bilateral) INSERTION PORT-A-CATH WITH [name] (Right)  Patient Location: PACU  Anesthesia Type:GA combined with regional for post-op pain  Level of Consciousness: awake and patient cooperative  Airway & Oxygen Therapy: Patient Spontanous Breathing  Post-op Assessment: Report given to RN and Post -op Vital signs reviewed and stable  Post vital signs: Reviewed and stable  Last Vitals:  Vitals:   07/22/17 1303  BP: 114/60  Pulse: 92  Resp: 20  Temp: 37.3 C  SpO2: 100%    Last Pain:  Vitals:   07/22/17 1315  TempSrc:   PainSc: 2          Complications: No apparent anesthesia complications

## 2017-07-22 NOTE — H&P (Signed)
59 yof referred by Dr Burr Medico for new right breast cancer. she has no fh, no personal history of breast cancer. she does not desire future fertility. 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 she has noted her right nipple retracting for years. she has no dc. she was noted by pcp to have a mass and then had evaluated. she has at least a 5.5 cm breast mass with nac retraction. the axilla is negative by [name]. she has c density breasts. 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 she is here with her partner Thomasena Edis. He is a kidney tx patient who has recently undergone a bka   Past Surgical History  Oral Surgery   Diagnostic Studies History Mammogram  within last year Pap Smear  1-5 years ago  Medication History  Medications Reconciled  Social History Caffeine use  Carbonated beverages, Coffee, Tea. No alcohol use  No drug use  Tobacco use  Never smoker.  Family History  Alcohol Abuse  Father. Breast Cancer  Family Members In General, Mother. Colon Cancer  Family Members In General. Heart Disease  Family Members In General. Hypertension  Family Members In General.  Pregnancy / Birth History Age at menarche  48 years. Contraceptive History  Oral contraceptives. Gravida  0 Irregular periods  Para  0  Other Problems Anxiety Disorder  Back Pain  Breast Cancer  Depression  Gastroesophageal Reflux Disease  Hypercholesterolemia  Kidney Stone  Lump In Breast  Migraine Headache   Review of Systems General Not Present- Appetite Loss, Chills, Fatigue, Fever, Night Sweats, Weight Gain and Weight Loss. Skin Not Present- Change in Wart/Mole, Dryness, Hives, Jaundice, New Lesions, Non-Healing Wounds, Rash and Ulcer. HEENT Present- Seasonal Allergies and Wears glasses/contact lenses. Not Present- Earache, Hearing Loss, Hoarseness, Nose Bleed, Oral Ulcers, Ringing in the Ears, Sinus Pain, Sore Throat, Visual  Disturbances and Yellow Eyes. Respiratory Not Present- Bloody sputum, Chronic Cough, Difficulty Breathing, Snoring and Wheezing. Breast Present- Breast Mass. Not Present- Breast Pain, Nipple Discharge and Skin Changes. Cardiovascular Not Present- Chest Pain, Difficulty Breathing Lying Down, Leg Cramps, Palpitations, Rapid Heart Rate, Shortness of Breath and Swelling of Extremities. Gastrointestinal Present- Nausea. Not Present- Abdominal Pain, Bloating, Bloody Stool, Change in Bowel Habits, Chronic diarrhea, Constipation, Difficulty Swallowing, Excessive gas, Gets full quickly at meals, Hemorrhoids, Indigestion, Rectal Pain and Vomiting. Female Genitourinary Not Present- Frequency, Nocturia, Painful Urination, Pelvic Pain and Urgency. Musculoskeletal Present- Muscle Pain. Not Present- Back Pain, Joint Pain, Joint Stiffness, Muscle Weakness and Swelling of Extremities. Neurological Present- Headaches. Not Present- Decreased Memory, Fainting, Numbness, Seizures, Tingling, Tremor, Trouble walking and Weakness. Psychiatric Present- Anxiety. Not Present- Bipolar, Change in Sleep Pattern, Depression, Fearful and Frequent crying. Endocrine Present- Cold Intolerance. Not Present- Excessive Hunger, Hair Changes, Heat Intolerance, Hot flashes and New Diabetes.   Physical Exam  General Mental Status-Alert. Orientation-Oriented X3. Head and Neck Trachea-midline. Thyroid Gland Characteristics - normal size and consistency. Eye Sclera/Conjunctiva - Bilateral-No scleral icterus. Pupil - Bilateral-Equal. Chest and Lung Exam Chest and lung exam reveals -quiet, even and easy respiratory effort with no use of accessory muscles and on auscultation, normal breath sounds, no adventitious sounds and normal vocal resonance. Breast Nipples Characteristics - Left - Tender and Retracted. Note: most of central breast appears to be large mass over 6 cm in size that is retracting the nac, there is no  overt skin involvement Cardiovascular Cardiovascular examination reveals -normal heart sounds, regular rate  and rhythm with no murmurs, normal pedal pulses bilaterally and no digital clubbing, cyanosis, edema, increased warmth or tenderness. Abdomen Note: soft nt/nd no hepatomegaly Neurologic Mental Status-Normal. Motor-Normal. Lymphatic Head & Neck General Head & Neck Lymphatics: Bilateral - Description - Normal. Axillary General Axillary Region: Bilateral - Description - Normal. Note: no Speed adenopathy  Assessment & Plan [name] MD; 07/04/2017 9:03 AM) CANCER OF CENTRAL PORTION OF RIGHT BREAST (C50.111) Story: right tm, right axillary sn biopsy, port placement, left proph mastectomy We discussed a sentinel lymph node biopsy as she does not appear to having lymph node involvement right now. We discussed the performance of that with injection of radioactive tracer and possibly blue dye. We discussed that she would have an incision underneath her axillary hairline or would be done through the same incision. We discussed that there is a chance of having a positive node with a sentinel lymph node biopsy and we will await the permanent pathology to make any other first further decisions in terms of her treatment. One of these options might be to return to the operating room to perform an axillary lymph node dissection. We discussed up to a 5% risk lifetime of chronic shoulder pain as well as lymphedema associated with a sentinel lymph node biopsy. I recommended mastectomy on right again. she does not want reconstruction. we discussed drains and risks/recovery associated with that. we discussed appearance, pt afterwards and prosthetic. will also place port. she is very much interested in proph mastectomy. i told her this was not mandatory and provided no medical benefit due to her negative genetics. I dont think unreasonable given her concerns though. will plan on this at same time.  she understands there is still breast cancer risk on that side and this is not 100% preventive.

## 2017-07-22 NOTE — Op Note (Signed)
Preoperative diagnosis: clinical stage III right breast cancer Postoperative diagnosis: saa Procedure: 1. Left prophylactic total mastectomy 2. Right modified radical mastectomy 3. Right IJ port placement with US guidance Surgeon Dr Serita Grammes Asst: Dr Irene Limbo EBL 50 cc Drains 119 Fr Blake drain to left mastectomy space, 2 19 Fr Blake drains to right side Complications none Specimens right mastectomy short superior long lateral, right axillary nodes with highest count 1269- I then removed multiple palpable nodes making this an aldn Left mastectomy marked short superior, long lateral Sponge and needle count correct times two Dispo to recovery stable  Indications: 69 yof who has newly diagnosed locally advanced right breast cancer.  After discussion she would like to proceed with prophylactic left mastectomy (genetics is negative) and she needs a right mastectomy.  We discussed right axillary sn biopsy and a port placement for chemotherapy as she was seen by oncology in [facility] prior.  Procedure: After informed consent obtained patient was taken to the OR. She was given antibiotics and scds were in place. She had pectoral blocks placed. She was placed under general anesthesia without complication. She was prepped and draped in standard sterile surgical fashion. A timeout was performed.  I made a large elliptical incision to encompass the nipple areola complex on the left side. Dr Iran Planas assisted on both sides and retraction was necessary. She assisted with closure as well. I then made flaps to the clavicle, parasternal area, inframammary fold and the latissimus.  I then removed the breast from the pectoralis muscle including the fascia.This was marked as above and passed off the table as a specimen.hemostasis was observed.  I placed a single 19 Fr Blake drain and secured it with a 2-0 nylon suture.  biopatch and tegaderam were placed. The skin was closed with 3-0 vicryl suture and  4-0 monocryl I put dermabond and steristrips over this.  I then turned my attention to the right side. I made a large elliptical incision again encompassing the entire tumor and the nipple areola complex.  I created flaps again to the clavicle, inframammary fold, parasternal area and the latissimus.  The breast was then removed the pectoralis including the fascia.  This was marked as above and passed of.  I then identifed several sentinel nodes together.  These were removed.  There were also a number of other nodes present and I ended up really doing an axillary dissection due to enlarged nodes.  I visualized the axillary vein, thoracodorsal bundle and the long thoracic nerve.  This was all passed off. hemostasis was observed.  I placed two  19 Fr Blake drains and secured them with a 2-0 nylon suture.  biopatch and tegaderm were placed. The skin was closed with 3-0 vicryl and 4-0 monocryl. I put dermabond and steristrips over this. I then turned my attention to the port.  I identified the right internal jugular vein with the ultrasound.  I accessed this with the needle.  I then placed a wire. The wire was confirmed to be in position by fluoroscopy and by ultrasound.  I then infiltrated marcaine below the clavicle.  I made an incision and created a pocket for the port.  I then tunneled the line between the venous insertion site and the pocket.  The dilator was then placed under fluoro and the wire assembly removed.  The line was placed and the peel away sheath removed.  The line was pulled back in the svc.  I then attached the port and sutured  this with 3-0 prolene suture. This aspirated blood and flushed easily.  I then packed this with heparin.  This was closed with 3-0 vicryl and 4-0 monocryl. Glue was placed over the incisions.  Final fluoro showed the port and line in position.Binder was placed. She was extubated and transferred to recovery stable

## 2017-07-22 NOTE — Anesthesia Preprocedure Evaluation (Addendum)
Anesthesia Evaluation  Patient identified by MRN, date of birth, ID band Patient awake    Reviewed: Allergy & Precautions, NPO status , Patient's Chart, lab work & pertinent test results  Airway Mallampati: II  TM Distance: >3 FB Neck ROM: Full    Dental  (+) Teeth Intact, Dental Advisory Given   Pulmonary    breath sounds clear to auscultation       Cardiovascular  Rhythm:Regular Rate:Normal     Neuro/Psych    GI/Hepatic   Endo/Other    Renal/GU      Musculoskeletal   Abdominal   Peds  Hematology   Anesthesia Other Findings   Reproductive/Obstetrics                             Anesthesia Physical Anesthesia Plan  ASA: II  Anesthesia Plan: General and Regional   Post-op Pain Management:    Induction: Intravenous  PONV Risk Score and Plan: 1 and 2 and Ondansetron and Dexamethasone  Airway Management Planned: LMA  Additional Equipment:   Intra-op Plan:   Post-operative Plan: Extubation in OR  Informed Consent: I have reviewed the patients History and Physical, chart, labs and discussed the procedure including the risks, benefits and alternatives for the proposed anesthesia with the patient or authorized representative who has indicated his/her understanding and acceptance.   Dental advisory given  Plan Discussed with: CRNA and Anesthesiologist  Anesthesia Plan Comments:         Anesthesia Quick Evaluation

## 2017-07-23 ENCOUNTER — Encounter (HOSPITAL_COMMUNITY): Payer: Self-pay | Admitting: General Surgery

## 2017-07-23 DIAGNOSIS — C50111 Malignant neoplasm of central portion of right female breast: Secondary | ICD-10-CM | POA: Diagnosis not present

## 2017-07-23 LAB — GLUCOSE, CAPILLARY: Glucose-Capillary: 165 mg/dL — ABNORMAL HIGH (ref 65–99)

## 2017-07-23 MED ORDER — METHOCARBAMOL 500 MG PO TABS: 500.0000 mg | ORAL_TABLET | Freq: Four times a day (QID) | ORAL | 0 refills | Status: DC | PRN

## 2017-07-23 MED ORDER — METHOCARBAMOL 1000 MG/10ML IJ SOLN
500.0000 mg | Freq: Once | INTRAVENOUS | Status: AC
  Administered 2017-07-23: 500 mg via INTRAVENOUS
  Filled 2017-07-23: qty 550

## 2017-07-23 MED ORDER — ONDANSETRON HCL 4 MG/2ML IJ SOLN
INTRAMUSCULAR | Status: AC
  Filled 2017-07-23: qty 2

## 2017-07-23 MED ORDER — ONDANSETRON HCL 4 MG/2ML IJ SOLN
4.0000 mg | INTRAMUSCULAR | Status: DC | PRN
  Administered 2017-07-23 (×2): 4 mg via INTRAVENOUS
  Filled 2017-07-23: qty 2

## 2017-07-23 MED ORDER — KETOROLAC TROMETHAMINE 15 MG/ML IJ SOLN
15.0000 mg | Freq: Once | INTRAMUSCULAR | Status: AC
  Administered 2017-07-23: 15 mg via INTRAVENOUS
  Filled 2017-07-23: qty 1

## 2017-07-23 MED ORDER — OXYCODONE HCL 5 MG PO TABS: 5.0000 mg | ORAL_TABLET | ORAL | 0 refills | Status: DC | PRN

## 2017-07-23 MED ORDER — ONDANSETRON 4 MG PO TBDP: 4.0000 mg | ORAL_TABLET | ORAL | Status: DC | PRN

## 2017-07-23 NOTE — Progress Notes (Signed)
1 Day Post-Op   Subjective/Chief Complaint: Lightheaded overnight better, nausea and vomiting, had I/o but now voiding   Objective: Vital signs in last 24 hours: Temp:  [97.2 F (36.2 C)-99.2 F (37.3 C)] 97.6 F (36.4 C) (09/05 8832) Pulse Rate:  [90-107] 90 (09/05 0633) Resp:  [6-20] 17 (09/05 0633) BP: (100-114)/(44-75) 100/54 (09/05 0633) SpO2:  [92 %-100 %] 100 % (09/05 5498) Weight:  [71.2 kg (157 lb)] 71.2 kg (157 lb) (09/04 2010)    Intake/Output from previous day: 09/04 0701 - 09/05 0700 In: 2641 [P.O.:120; I.V.:1500] Out: 1265 [Urine:900; Drains:165; Blood:200] Intake/Output this shift: No intake/output data recorded.  no hematomas, flaps viable, drains as expected   Studies/Results: Dg Chest Port 1 View  Result Date: 07/22/2017 CLINICAL DATA:  Port-A-Cath present.  Status post mastectomies EXAM: PORTABLE CHEST 1 VIEW COMPARISON:  None. FINDINGS: There are drains overlying each hemithorax. Port-A-Cath tip is in the superior vena cava. No pneumothorax. No edema or consolidation. Heart size and pulmonary vascularity are normal. No adenopathy. No pneumothorax. No bone lesions. IMPRESSION: Drains overlying each pneumothorax. Port-A-Cath as described without pneumothorax. No edema or consolidation. Electronically Signed   By: Lowella Grip III M.D.   On: 07/22/2017 19:23   Dg Fluoro Guide Cv Line-no Report  Result Date: 07/22/2017 Fluoroscopy was utilized by the requesting physician.  No radiographic interpretation.    Anti-infectives: Anti-infectives    Start     Dose/Rate Route Frequency Ordered Stop   07/23/17 0600  ceFAZolin (ANCEF) IVPB 2g/100 mL premix     2 g 200 mL/hr over 30 Minutes Intravenous On call to O.R. 07/22/17 1250 07/22/17 1619      Assessment/Plan: POD 1 right mrm/left tm/port  Will give toradol and iv robaxin this am for pain control given inability to take oral meds pulm toilet, oob Possibly dc home later today but likely  tomorrow  [facility] 07/23/2017

## 2017-07-23 NOTE — Progress Notes (Signed)
During rounds RN requested a second pair of eyes due to patient reports feeling "lightheaded" and feeling "heavy" all over. Pt arrived from PACU around change of shift after bilateral mastectomy.  Pt a/o x4, BP 108/62, HR 95, RR 17, 100% RA. No interventions from RRT. Advised RN to page MD on call to make them aware. No new orders. Will continue to monitor. Encouraged RN to call for any concerns.

## 2017-07-23 NOTE — Progress Notes (Signed)
Anesthesiology Follow-up:  Awake and alert, resting. Having mild incisional soreness bilaterally. Some nausea this morning. Able to take PO liquids OK.  VS: T- 36.4 BP- 110/54 HR- 90 RR- 17 O2 Sat 19% on RA  41 year old female one day S/P R.total mastectomy with axillary lymph node biopsy and L. prophylactic mastectomy. Overall doing well, should be OK for discharge home tomorrow.  Roberts Gaudy

## 2017-07-24 DIAGNOSIS — C50111 Malignant neoplasm of central portion of right female breast: Secondary | ICD-10-CM | POA: Diagnosis not present

## 2017-07-24 NOTE — Progress Notes (Signed)
2 Days Post-Op   Subjective/Chief Complaint: Doing better, no issues , ready to go home   Objective: Vital signs in last 24 hours: Temp:  [98 F (36.7 C)-98.5 F (36.9 C)] 98 F (36.7 C) (09/06 0525) Pulse Rate:  [90-99] 93 (09/06 0525) Resp:  [18] 18 (09/06 0525) BP: (90-95)/(48-58) 93/58 (09/06 0525) SpO2:  [100 %] 100 % (09/06 0525) Last BM Date: 07/21/17  Intake/Output from previous day: 09/05 0701 - 09/06 0700 In: 0233 [P.O.:780; I.V.:690] Out: 1565 [Urine:1350; Drains:215] Intake/Output this shift: No intake/output data recorded.   no hematomas, flaps viable, drains as expected  Studies/Results: Dg Chest Port 1 View  Result Date: 07/22/2017 CLINICAL DATA:  Port-A-Cath present.  Status post mastectomies EXAM: PORTABLE CHEST 1 VIEW COMPARISON:  None. FINDINGS: There are drains overlying each hemithorax. Port-A-Cath tip is in the superior vena cava. No pneumothorax. No edema or consolidation. Heart size and pulmonary vascularity are normal. No adenopathy. No pneumothorax. No bone lesions. IMPRESSION: Drains overlying each pneumothorax. Port-A-Cath as described without pneumothorax. No edema or consolidation. Electronically Signed   By: Lowella Grip III M.D.   On: 07/22/2017 19:23   Dg Fluoro Guide Cv Line-no Report  Result Date: 07/22/2017 Fluoroscopy was utilized by the requesting physician.  No radiographic interpretation.    Anti-infectives: Anti-infectives    Start     Dose/Rate Route Frequency Ordered Stop   07/23/17 0600  ceFAZolin (ANCEF) IVPB 2g/100 mL premix     2 g 200 mL/hr over 30 Minutes Intravenous On call to O.R. 07/22/17 1250 07/22/17 1619      Assessment/Plan: POD 1 right mrm/left tm/port  Dc home today Path pending  [facility] 07/24/2017

## 2017-07-24 NOTE — Discharge Summary (Signed)
Physician Discharge Summary  Patient ID: Sheila [name] MRN: [id number] DOB/AGE: Nov 14, 1976 41 y.o.  Admit date: 07/22/2017 Discharge date: 07/24/2017  Admission Diagnoses: Right breast cancer GERD  Discharge Diagnoses:  Active Problems:   Breast cancer [facility])   Discharged Condition: good  Hospital Course: 89 yof underwent right mrm, port, left proph mastectomy for breast cancer. She remained an additional night due to nausea and we finished case late.  She is tolerating diet.  She has good pain control and will be discharged home.  Path is pending.   Consults: None  Significant Diagnostic Studies: none  Treatments: surgery: see above   Disposition: 01-Home or Self Care   Allergies as of 07/24/2017   No Known Allergies     Medication List    TAKE these medications   calcium carbonate 500 MG chewable tablet Commonly known as:  TUMS - dosed in mg elemental calcium Chew 2 tablets by mouth as needed for indigestion or heartburn.   diphenhydramine-acetaminophen 25-500 MG Tabs tablet Commonly known as:  TYLENOL PM Take 2 tablets by mouth at bedtime. sleep   fluticasone 50 MCG/ACT nasal spray Commonly known as:  FLONASE Place 1 spray into both nostrils daily.   methocarbamol 500 MG tablet Commonly known as:  ROBAXIN Take 1 tablet (500 mg total) by mouth every 6 (six) hours as needed for muscle spasms.   omeprazole 20 MG capsule Commonly known as:  PRILOSEC Take 20 mg by mouth at bedtime.   oxyCODONE 5 MG immediate release tablet Commonly known as:  Oxy IR/ROXICODONE Take 1 tablet (5 mg total) by mouth every 4 (four) hours as needed for moderate pain.   Vitamin D (Ergocalciferol) 50000 units Caps capsule Commonly known as:  DRISDOL Take 50,000 Units by mouth once a week. Fridays            Discharge Care Instructions        Start     Ordered   07/23/17 0000  methocarbamol (ROBAXIN) 500 MG tablet  Every 6 hours PRN     07/23/17 0735   07/23/17 0000   oxyCODONE (OXY IR/ROXICODONE) 5 MG immediate release tablet  Every 4 hours PRN     07/23/17 0735     Follow-up Information    [name], MD.   Specialty:  General Surgery Contact information: [address] [place] [address] [phone]           Signed: [name] 07/24/2017, 3:27 PM

## 2017-07-24 NOTE — Progress Notes (Signed)
Discharge paperwork given to patient. Prescriptions given. No questions verbalized. Patient is ready for discharge.

## 2017-07-29 ENCOUNTER — Telehealth (HOSPITAL_COMMUNITY): Payer: Self-pay | Admitting: Vascular Surgery

## 2017-07-29 NOTE — Telephone Encounter (Signed)
Left pt message to cancel echo appt , echo was not approved by insurance

## 2017-07-30 ENCOUNTER — Ambulatory Visit (HOSPITAL_COMMUNITY): Payer: PRIVATE HEALTH INSURANCE | Admitting: Cardiology

## 2017-07-30 ENCOUNTER — Ambulatory Visit (HOSPITAL_COMMUNITY): Payer: PRIVATE HEALTH INSURANCE

## 2017-08-01 NOTE — Progress Notes (Signed)
[facility]  [phone] [phone]  Clinic Follow Up Note   Patient Care Team: Juluis Rainier, MD as PCP - General (Family Medicine) Delano Metz MD (Family Medicine) Emelia Loron, MD as Consulting Physician (General Surgery) Malachy Mood, MD as Consulting Physician (Hematology) Dorothy Puffer, MD as Consulting Physician (Radiation Oncology) 08/04/2017  CHIEF COMPLAINTS:  Follow up right breast cancer    Oncology History    Cancer Staging Malignant neoplasm of upper-outer quadrant of right breast in female, estrogen receptor positive (HCC) Staging form: Breast, AJCC 8th Edition - Clinical stage from 06/11/2017: Stage IB (cT3, cN0, cM0, G2, ER: Positive, PR: Positive, HER2: Positive) - Signed by Malachy Mood, MD on 06/16/2017 - Pathologic stage from 07/22/2017: Stage IB (pT3(m), pN2a(sn), cM0, G2, ER: Positive, PR: Positive, HER2: Positive) - Signed by Pollyann Samples, NP on 08/04/2017      Malignant neoplasm of upper-outer quadrant of right breast in female, estrogen receptor positive (HCC)   06/11/2017 Initial Biopsy    Diagnosis 06/11/17 Breast, right, needle core biopsy, 11:00 o'clock - INVASIVE DUCTAL CARCINOMA, G2      06/11/2017 Mammogram    [name] and MM Diagnostic Breast Tomo Bilateral 06/11/17 IMPRESSION: 1. Highly suspicious mass within the retroareolar right breast, extending from the 8:00 to 11:00 axes, anterior to posterior depth, extending anteriorly to the nipple with associated nipple retraction, measuring at least 5.5 cm by ultrasound  2. No sonographic evidence of metastatic lymphadenopathy in the right axilla. 3. No evidence of malignancy within the left breast.       06/11/2017 Initial Diagnosis    Malignant neoplasm of upper-outer quadrant of right breast in female, estrogen receptor positive (HCC)      06/11/2017 Receptors her2    Estrogen Receptor: 100%, POSITIVE, STRONG STAINING INTENSITY Progesterone Receptor: 100%,  POSITIVE, STRONG STAINING INTENSITY Proliferation Marker Ki67: 12%Proliferation Marker Ki67: 12%  HER2 - **POSITIVE** RATIO OF HER2/CEP17 SIGNALS 2.55 AVERAGE HER2 COPY NUMBER PER CELL 4.85      06/25/2017 Genetic Testing    Negative genetic testing on the 9 gene STAT panel.  The STAT Breast cancer panel offered by [facility] includes sequencing and rearrangement analysis for the following 9 genes:  ATM, BRCA1, BRCA2, CDH1, CHEK2, PALB2, PTEN, STK11 and TP53.   The report date is June 25, 2017.  Negative genetic testing on the common hereditary cancer panel.  The Hereditary Gene Panel offered by [facility] includes sequencing and/or deletion duplication testing of the following 46 genes: APC, ATM, AXIN2, BARD1, BMPR1A, BRCA1, BRCA2, BRIP1, CDH1, CDKN2A (p14ARF), CDKN2A (p16INK4a), CHEK2, CTNNA1, DICER1, EPCAM (Deletion/duplication testing only), GREM1 (promoter region deletion/duplication testing only), KIT, MEN1, MLH1, MSH2, MSH3, MSH6, MUTYH, NBN, NF1, NHTL1, PALB2, PDGFRA, PMS2, POLD1, POLE, PTEN, RAD50, RAD51C, RAD51D, SDHB, SDHC, SDHD, SMAD4, SMARCA4. STK11, TP53, TSC1, TSC2, and VHL.  The following genes were evaluated for sequence changes only: SDHA and HOXB13 c.251G>A variant only.  The report date is June 25, 2017.       07/02/2017 Imaging    CT cAP 07/02/17 IMPRESSION: 1. Subareolar right breast mass. No compelling findings of nodal or metastatic involvement. 2. Old granulomatous disease. 3. There is a 7 mm enhancing or hyperdense focus posteriorly in segment 7 of the liver in the subcapsular region. By virtue of its small size this lesion is technically nonspecific although statistically likely to be a small benign lesions such as flash filling hemangioma. This may warrant surveillance. 4.  Prominent stool throughout the colon favors constipation. 5. 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      07/02/2017 Imaging    Bone scan  07/02/17 IMPRESSION: Today' s exam is negative. I ascribed the tiny focus of activity just proximal to the left antecubital region to injection site.      07/22/2017 Surgery    RIGHT TOTAL MASTECTOMY WITH RIGHT AXILLARY SENTINEL LYMPH NODE BIOPSY, LEFT PROPHYLACTIC MASTECTOM and INSERTION PORT-A-CATH WITH [name] by [name] and [facility]       07/22/2017 Pathology Results    Diagnosis 1. Breast, simple mastectomy, Left - FIBROADENOMA. - NO MALIGNANCY IDENTIFIED. 2. 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 - ADDITIONAL FOCUS OF LOBULAR CARCINOMA, GRADE 2, SPANNING 2.2 CM. - INTERMEDIATE GRADE DUCTAL CARCINOMA IN SITU. - INVASIVE CARCINOMA COMES TO WITHIN 0.2 TO 0.3 CM OF THE DEEP MARGIN, FOCALLY. - TUMOR FOCALLY INVOLVES EPIDERMIS. - LYMPHOVASCULAR INVASION OF DERMAL LYMPHATICS. - ONE OF ONE LYMPH NODES NEGATIVE FOR CARCINOMA (0/1). - SEE ONCOLOGY TABLE. 3. Lymph node, sentinel, biopsy, Right axillary - ONE LYMPH NODE WITH ISOLATED TUMOR CELLS (0/1). 4. Lymph node, sentinel, biopsy, Right - METASTATIC CARCINOMA IN ONE OF ONE LYMPH NODES (1/1). 5. Lymph node, sentinel, biopsy, Right - ONE OF ONE LYMPH NODES NEGATIVE FOR CARCINOMA (0/1). 6. Lymph node, sentinel, biopsy, Right - METASTATIC CARCINOMA IN ONE OF ONE LYMPH NODES (1/1). - EXTRACAPSULAR EXTENSION. 7. Lymph node, sentinel, biopsy, Right - METASTATIC CARCINOMA IN ONE OF ONE LYMPH NODES (1/1). - EXTRACAPSULAR EXTENSION. 8. Lymph node, sentinel, biopsy, Right - ONE OF ONE LYMPH NODES NEGATIVE FOR CARCINOMA (0/1). 9. Lymph node, sentinel, biopsy, Right - ONE OF ONE LYMPH NODES NEGATIVE FOR CARCINOMA (0/1). 10. Lymph node, sentinel, biopsy, Right - METASTATIC CARCINOMA IN ONE OF ONE LYMPH NODES (1/1). - EXTRACAPSULAR EXTENSION. 11. Lymph node, biopsy, Right axillary - ONE OF ONE LYMPH NODES NEGATIVE FOR CARCINOMA (0/1).        Chemotherapy    PENDING  adjuvant TCHP every 3 weeks for 6 cycles, followed by maintenance Herceptin with or without Perjeta for 6-12 months        HISTORY OF PRESENTING ILLNESS: [birth year] Suan Halter 41 y.o. female is here because of newly diagnosed Malignant neoplasm of upper-outer quadrant of right breast. She presents to the breast clinic today with her common law husband. She felt the lump initially when she had her physical 2 weeks ago, she had felt her nipples retracted several years ago. A few months ago it has become more pronounced. She had no pain or discharge.   In the past was diagnosed with Vitamin D deficiency and high cholesterol. She previously was on mood stabilizing medication. She feel right now she does not need that medication but is overwhelmed. She donated eggs and previously had kidney stones removed. Her mother had breast cancer and was diagnosed at 55 along with paternal grandmother in her 43s.   Today she has heart burn which she is taking Prilosec and will see her GI later today. She has trouble sleeping and uses tylenol pm. She tried [place] which did not help, same with melatonin. Her common law husband Is on disability.  GYN HISTORY  Menarchal: 19 LMP: July 23rd, 2018 Contraceptive: 20-29 on birth control pill HRT: NA G0P0: does not want kids  CURRENT THERAPY: PENDING adjuvant chemotherapy TCHP every 3 weeks for 6 cycles beginning 08/28/2017  INTERVAL HISTORY:  Adamaris King is here for a follow up.  She presents to the clinic today post mastectomy with her husband. She underwent bilateral mastectomy with sentinel lymph node biopsy with [name] on 07/22/17. She has had one post-op follow up so far, one drain was removed. She will return to surgeon tomorrow for what she expects will be d/c of 2 other drains. She originally preferred immediate reconstruction and met with Dr. Iran Planas but ultimately she did not elect reconstruction. She is recovering well post-op with some mild  fatigue. She is able to work from home. Her drainage output is decreasing, she requires less pain medication, and she is advancing her activity. Pain is controlled with tramadol She has limited range of motion to both arms, improving quicker on the left than right. She denies incision discharge. The patient has a good understanding of pathology findings of surgery.  MEDICAL HISTORY:  Past Medical History:  Diagnosis Date  . Anxiety   . Cancer [facility])    right breast   . Family history of breast cancer   . Family history of colon cancer   . GERD (gastroesophageal reflux disease)   . Headache    couple of migraines in the past  . History of kidney stones   . Vitamin D deficiency    SURGICAL HISTORY: Past Surgical History:  Procedure Laterality Date  . egg donation    . kidney stone removal    . MASTECTOMY W/ SENTINEL NODE BIOPSY Bilateral 07/22/2017   Procedure: RIGHT TOTAL MASTECTOMY WITH RIGHT AXILLARY SENTINEL LYMPH NODE BIOPSY, LEFT PROPHYLACTIC MASTECTOMY;  Surgeon: [name], MD;  Location: [place];  Service: General;  Laterality: Bilateral;  . PORTACATH PLACEMENT Right 07/22/2017   Procedure: INSERTION PORT-A-CATH WITH [name];  Surgeon: [name], MD;  Location: [place];  Service: General;  Laterality: Right;   SOCIAL HISTORY: Social History   Social History  . Marital status: Single    Spouse name: N/A  . Number of children: N/A  . Years of education: N/A   Occupational History  . Not on file.   Social History Main Topics  . Smoking status: Never Smoker  . Smokeless tobacco: Former Systems developer  . Alcohol use Yes     Comment: once a year  . Drug use: No  . Sexual activity: Yes   Other Topics Concern  . Not on file   Social History Narrative  . No narrative on file   FAMILY HISTORY: Family History  Problem Relation Age of Onset  . Breast cancer Paternal Grandmother 93  . Colon cancer Paternal Grandmother 31  . Breast cancer Mother 50  . Prostate cancer  Paternal Grandfather        dx in his 38s  . Leukemia Paternal Grandfather   . Lung cancer Maternal Uncle        heavy smoker  . Other Paternal Uncle        farm accident  . Stroke Maternal Grandfather    ALLERGIES:  has No Known Allergies.  MEDICATIONS:  Current Outpatient Prescriptions  Medication Sig Dispense Refill  . calcium carbonate (TUMS - DOSED IN MG ELEMENTAL CALCIUM) 500 MG chewable tablet Chew 2 tablets by mouth as needed for indigestion or heartburn.     . diphenhydramine-acetaminophen (TYLENOL PM) 25-500 MG TABS tablet Take 2 tablets by mouth at bedtime. sleep    . fluticasone (FLONASE) 50 MCG/ACT nasal spray Place 1 spray into both nostrils daily.    Marland Kitchen omeprazole (PRILOSEC) 20 MG capsule Take 20 mg by mouth at bedtime.     Marland Kitchen  traMADol (ULTRAM) 50 MG tablet Take 50 mg by mouth every 6 (six) hours as needed. for pain  0   No current facility-administered medications for this visit.    REVIEW OF SYSTEMS:  Constitutional: Denies fevers, chills or abnormal night sweats (+) trouble sleeping (+) fatigue Eyes: Denies blurriness of vision, double vision or watery eyes Ears, nose, mouth, throat, and face: Denies mucositis or sore throat Respiratory: Denies cough, dyspnea or wheezes Cardiovascular: Denies palpitation, chest discomfort or lower extremity swelling Gastrointestinal:  Denies nausea, heartburn or change in bowel habits (+) heart burn Skin: Denies abnormal skin rashes Lymphatics: Denies new lymphadenopathy or easy bruising Neurological:Denies numbness, tingling or new weaknesses Behavioral/Psych: Mood is stable, no new changes  Breast: (+) bilateral mastectomy on 07/22/17 All other systems were reviewed with the patient and are negative.  PHYSICAL EXAMINATION: ECOG PERFORMANCE STATUS: 1 - Symptomatic but completely ambulatory  Vitals:   08/04/17 1255  BP: (!) 97/52  Pulse: 90  Resp: 18  Temp: 98.4 F (36.9 C)  SpO2: 100%   Filed Weights   08/04/17 1255   Weight: 156 lb 3.2 oz (70.9 kg)   GENERAL:alert, no distress and comfortable SKIN: skin color, texture, turgor are normal, no rashes or significant lesions EYES: normal, conjunctiva are pink and non-injected, sclera clear OROPHARYNX:no exudate, no erythema and lips, buccal mucosa, and tongue normal  NECK: supple, thyroid normal size, non-tender, without nodularity LYMPH:  no palpable lymphadenopathy in the cervical, axillary or inguinal LUNGS: clear to auscultation and percussion with normal breathing effort HEART: regular rate & rhythm and no murmurs and no lower extremity edema ABDOMEN:abdomen soft, non-tender and normal bowel sounds Musculoskeletal:no cyanosis of digits and no clubbing  PSYCH: alert & oriented x 3 with fluent speech NEURO: no focal motor/sensory deficits Breast: (+)bilateral mastectomy, incisions clean and intact, healing well. Bilateral drains present.   LABORATORY DATA:  I have reviewed the data as listed CBC Latest Ref Rng & Units 07/15/2017 06/18/2017 12/03/2014  WBC 4.0 - 10.5 K/uL 4.9 3.6(L) 5.8  Hemoglobin 12.0 - 15.0 g/dL 12.8 13.3 12.3  Hematocrit 36.0 - 46.0 % 39.2 40.9 36.9  Platelets 150 - 400 K/uL 194 187 160   CMP Latest Ref Rng & Units 07/15/2017 06/18/2017 12/03/2014  Glucose 65 - 99 mg/dL 91 97 128(H)  BUN 6 - 20 mg/dL 12 14.6 12  Creatinine 0.44 - 1.00 mg/dL 0.95 1.1 0.87  Sodium 135 - 145 mmol/L 139 140 134(L)  Potassium 3.5 - 5.1 mmol/L 4.6 4.5 3.7  Chloride 101 - 111 mmol/L 106 - 104  CO2 22 - 32 mmol/L _0 Calcium 8.9 - 10.3 mg/dL 9.6 9.4 8.2(L)  Total Protein 6.4 - 8.3 g/dL - 7.2 6.7  Total Bilirubin 0.20 - 1.20 mg/dL - 0.46 0.8  Alkaline Phos 40 - 150 U/L - 59 65  AST 5 - 34 U/L - 11 22  ALT 0 - 55 U/L - 7 10   PATHOLOGY  Diagnosis 07/22/17 1. Breast, simple mastectomy, Left - FIBROADENOMA. - NO MALIGNANCY IDENTIFIED. 2. 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 -  ADDITIONAL FOCUS OF LOBULAR CARCINOMA, GRADE 2, SPANNING 2.2 CM. - INTERMEDIATE GRADE DUCTAL CARCINOMA IN SITU. - INVASIVE CARCINOMA COMES TO WITHIN 0.2 TO 0.3 CM OF THE DEEP MARGIN, FOCALLY. - TUMOR FOCALLY INVOLVES EPIDERMIS. - LYMPHOVASCULAR INVASION OF DERMAL LYMPHATICS. - ONE OF ONE LYMPH NODES NEGATIVE FOR CARCINOMA (0/1). - SEE ONCOLOGY TABLE. 3.  Lymph node, sentinel, biopsy, Right axillary - ONE LYMPH NODE WITH ISOLATED TUMOR CELLS (0/1). 4. Lymph node, sentinel, biopsy, Right - METASTATIC CARCINOMA IN ONE OF ONE LYMPH NODES (1/1). 5. Lymph node, sentinel, biopsy, Right - ONE OF ONE LYMPH NODES NEGATIVE FOR CARCINOMA (0/1). 6. Lymph node, sentinel, biopsy, Right - METASTATIC CARCINOMA IN ONE OF ONE LYMPH NODES (1/1). - EXTRACAPSULAR EXTENSION. 7. Lymph node, sentinel, biopsy, Right - METASTATIC CARCINOMA IN ONE OF ONE LYMPH NODES (1/1). - EXTRACAPSULAR EXTENSION. 8. Lymph node, sentinel, biopsy, Right - ONE OF ONE LYMPH NODES NEGATIVE FOR CARCINOMA (0/1). 9. Lymph node, sentinel, biopsy, Right - ONE OF ONE LYMPH NODES NEGATIVE FOR CARCINOMA (0/1). 10. Lymph node, sentinel, biopsy, Right - METASTATIC CARCINOMA IN ONE OF ONE LYMPH NODES (1/1). - EXTRACAPSULAR EXTENSION. 11. Lymph node, biopsy, Right axillary - ONE OF ONE LYMPH NODES NEGATIVE FOR CARCINOMA (0/1). Microscopic Comment 2. BREAST, INVASIVE TUMOR Microscopic Comment(continued) Procedure: Bilateral simple mastectomies with right axillary sentinel lymph node biopsies. Laterality: Right. Tumor Size: 5.3 cm. Histologic Type: Mixed lobular and ductal carcinoma. Grade: 2 Tubular Differentiation: 2 Nuclear Pleomorphism: 3 Mitotic Count: 1 Ductal Carcinoma in Situ (DCIS): Present, intermediate grade. Extent of Tumor: Involves epidermis of nipple focally. Margins: Invasive carcinoma, distance from closest margin: 0.2-0.3 cm of posterior margin (smaller mass). DCIS, distance from closest margin: >0.5 cm all  margins Regional Lymph Nodes: Number of Lymph Nodes Examined: 10 Number of Sentinel Lymph Nodes Examined: 9 Lymph Nodes with Macrometastases: 4 Lymph Nodes with Micrometastases: 0 Lymph Nodes with Isolated Tumor Cells: 1 Breast Prognostic Profile: Performed on biopsy ([id number]), see below. Will be performed on additional lobular focus. Estrogen Receptor: Positive, 100% strong staining. Progesterone Receptor: Positive, 100% strong staining. Her2: Positive (ratio 2.55). Ki-67: 12%. Best tumor block for sendout testing: 2B (larger focus), 2E (smaller focus). Pathologic Stage Classification (pTNM, AJCC 8th Edition): Primary Tumor (pT): mpT3 Regional Lymph Nodes (pN): pN2a Distant Metastases (pM): pMX 2. FLUORESCENCE IN-SITU HYBRIDIZATION Results: HER2 - NEGATIVE RATIO OF HER2/CEP17 SIGNALS 1.71 AVERAGE HER2 COPY NUMBER PER CELL 1.80 2. PROGNOSTIC INDICATORS Results: IMMUNOHISTOCHEMICAL AND MORPHOMETRIC ANALYSIS PERFORMED MANUALLY Estrogen Receptor: 95%, POSITIVE, STRONG STAINING INTENSITY Progesterone Receptor: 95%, POSITIVE, STRONG STAINING INTENSITY Proliferation Marker Ki67: 10%  Diagnosis 06/11/17 Breast, right, needle core biopsy, 11:00 o'clock - INVASIVE DUCTAL CARCINOMA, SEE COMMENT. Microscopic Comment The carcinoma appears grade 2. Prognostic markers will be ordered. Dr. Lyndon Code has reviewed the case. The case was called to [facility] on 06/12/2017. Results: HER2 - **POSITIVE** RATIO OF HER2/CEP17 SIGNALS 2.55 AVERAGE HER2 COPY NUMBER PER CELL 4.85  PROCEDURES  ECHO expected 08/12/17  RADIOGRAPHIC STUDIES: I have personally reviewed the radiological images as listed and agreed with the findings in the report. Dg Chest Port 1 View  Result Date: 07/22/2017 CLINICAL DATA:  Port-A-Cath present.  Status post mastectomies EXAM: PORTABLE CHEST 1 VIEW COMPARISON:  None. FINDINGS: There are drains overlying each hemithorax. Port-A-Cath tip is in the  superior vena cava. No pneumothorax. No edema or consolidation. Heart size and pulmonary vascularity are normal. No adenopathy. No pneumothorax. No bone lesions. IMPRESSION: Drains overlying each pneumothorax. Port-A-Cath as described without pneumothorax. No edema or consolidation. Electronically Signed   By: Lowella Grip III M.D.   On: 07/22/2017 19:23   Dg Fluoro Guide Cv Line-no Report  Result Date: 07/22/2017 Fluoroscopy was utilized by the requesting physician.  No radiographic interpretation.   ASSESSMENT & PLAN:  Erla Bacchi is a 41 y.o. premenopausal female with  a history of Vitamin D deficiency and high cholesterol and kidney stones, presented with a palpable right breast mass.   1. Malignant neoplasm of upper-outer quadrant of right breast in female, mixed invasive ductal and lobular carcinoma, pT3(m)N2aM0, stage 1b, ductal carcinoma tripple positive, lobular carcinoma ER+/PR+/HER2-,  Grade 2 -She underwent staging CT and bone scan on 07/02/2017 prior to surgery, both were negative for metastasis. -There was a 70m enhancing or hyperdense focus posteriorly in the liver which is technically nonspecific, I discussed this with the patient and would like to schedule an ABD MRI w/wo contrast in 2 weeks to explore this further  -I reviewed the surgical pathology with the patient and her husband in detail.  -Final diagnosis from the right simple mastectomy and sentinel lymph node biopsy on 07/22/2017 revealed mixed invasive lobular and ductal carcinoma, grade 2, spanning 5.3 cm. There is an additional focus of lobular carcinoma, grade 2, spanning 2.2cm and it is ER+/PR+/HER2-. 4 of 10 lymph nodes tested positive for macrometastases. The initial biopsy was mainly ductal with ER, PR, and HER2 positive. I reviewed this in detail with the patient.  -I have asked the pathologist to re-examine the positive lymph nodes to determine what percentage is lobular vs ductal, and if whether the lymph nodes  are HER2 positive.  -I also discussed the possibility of oral anti-HER2 therapy after she completes Herceptin maintenance therapy. -We reviewed the risk of cancer recurrence after complete surgical resection. Giving the large size of tumor, multiple positive lymph nodes, HER-2 positive disease, she has very high risk for recurrence. I strongly recommend adjuvant chemotherapy to reduce her risk of recurrence.  -She initially preferred reconstruction. She saw Dr. TIran Planasbut ultimately did not opt for reconstruction.  -Given her locally advanced disease, I recommend adjuvant docetaxel, carboplatin, Herceptin and pejeta (TCHP) every 3 weeks for 6 cycles, followed by maintenance Herceptin with or without Perjeta for 6-12 months (6 months if node metastasis is her2-) -I discussed she will receive Neulasta and informed her of common side effect of bone pain. I instructed her to take claritin x5 days after treatment.  --Chemotherapy consent: Side effects including but does not not limited to, fatigue, nausea, vomiting, diarrhea, hair loss, neuropathy, fluid retention, renal and kidney dysfunction, neutropenic fever, needed for blood transfusion, bleeding, reversible heart failure, were discussed with patient in great detail. She agrees to proceed. -The goal of therapy is curative -Given her ER/PR positive tumor, I recommend adjuvant antiestrogen therapy with tamoxifen, starting after her adjuvant irradiation. Benefit and the side effects reviewed with her, she is interested. -She will attend chemo class prior to chemotherapy.  -PAC placed during surgery, Chest xray confirms tip placement in the SVC. -She will get a baseline echo on 08/12/17 and see a cardiologist that day -plan to begin adjuvant chemo 08/28/17 with lab and f/u that day  2. Genetics -Due to her family history of breast cancer, she agreed to a genetics referral -She was negative for all mutations tested   PLAN:  -MRI abdomen/liver in 2  weeks, ordered today -I requested her positive node to be tested for ER/PR/HER2  -chemo class in 2 weeks -Echo and cardiology consult on 08/12/17 -[facility] x6 [facility]  -I called in emla cram, dexamethasone, Zofran and Compazine. She will take dexa '4mg'$  bid the day before, and on day 2-4 after chemo  All questions were answered. The patient knows to call the clinic with any problems, questions  or concerns. I spent 30 minutes counseling the patient face to face. The total time spent in the appointment was 40 minutes and more than 50% was on counseling.  Patient was seen and examed with NP Cira Rue today   [name]  08/04/2017

## 2017-08-04 ENCOUNTER — Ambulatory Visit: Payer: PRIVATE HEALTH INSURANCE | Admitting: Hematology

## 2017-08-04 ENCOUNTER — Telehealth: Payer: Self-pay | Admitting: Hematology

## 2017-08-04 ENCOUNTER — Ambulatory Visit (HOSPITAL_BASED_OUTPATIENT_CLINIC_OR_DEPARTMENT_OTHER): Payer: PRIVATE HEALTH INSURANCE | Admitting: Hematology

## 2017-08-04 VITALS — BP 97/52 | HR 90 | Temp 98.4°F | Resp 18 | Ht 66.0 in | Wt 156.2 lb

## 2017-08-04 DIAGNOSIS — C50411 Malignant neoplasm of upper-outer quadrant of right female breast: Secondary | ICD-10-CM

## 2017-08-04 DIAGNOSIS — E559 Vitamin D deficiency, unspecified: Secondary | ICD-10-CM | POA: Diagnosis not present

## 2017-08-04 DIAGNOSIS — Z170 Estrogen receptor positive status [ER+]: Secondary | ICD-10-CM

## 2017-08-04 NOTE — Anesthesia Postprocedure Evaluation (Signed)
Anesthesia Post Note  Patient: [name]  Procedure(s) Performed: Procedure(s) (LRB): RIGHT TOTAL MASTECTOMY WITH RIGHT AXILLARY SENTINEL LYMPH NODE BIOPSY, LEFT PROPHYLACTIC MASTECTOMY (Bilateral) INSERTION PORT-A-CATH WITH [name] (Right)     Patient location during evaluation: PACU Anesthesia Type: Regional and General Level of consciousness: awake, awake and alert and oriented Pain management: pain level controlled Vital Signs Assessment: post-procedure vital signs reviewed and stable Respiratory status: spontaneous breathing Cardiovascular status: blood pressure returned to baseline Postop Assessment: no headache Anesthetic complications: no    Last Vitals:  Vitals:   07/24/17 0525 07/24/17 1058  BP: (!) 93/58 (!) 94/59  Pulse: 93 (!) 103  Resp: 18 18  Temp: 36.7 C 36.8 C  SpO2: 100% 100%    Last Pain:  Vitals:   07/24/17 1058  TempSrc: Oral  PainSc:                  Romuald Mccaslin COKER

## 2017-08-04 NOTE — Telephone Encounter (Signed)
Gave patient avs report and appointments for September thru November. Central radiology will call re mri.

## 2017-08-05 ENCOUNTER — Encounter: Payer: Self-pay | Admitting: Hematology

## 2017-08-05 MED ORDER — DEXAMETHASONE 4 MG PO TABS: 8.0000 mg | ORAL_TABLET | Freq: Two times a day (BID) | ORAL | 1 refills | Status: DC

## 2017-08-05 MED ORDER — PROCHLORPERAZINE MALEATE 10 MG PO TABS: 10.0000 mg | ORAL_TABLET | Freq: Four times a day (QID) | ORAL | 1 refills | Status: DC | PRN

## 2017-08-05 MED ORDER — LIDOCAINE-PRILOCAINE 2.5-2.5 % EX CREA: TOPICAL_CREAM | CUTANEOUS | 3 refills | Status: DC

## 2017-08-05 MED ORDER — ONDANSETRON HCL 8 MG PO TABS: 8.0000 mg | ORAL_TABLET | Freq: Two times a day (BID) | ORAL | 1 refills | Status: DC | PRN

## 2017-08-05 NOTE — Progress Notes (Signed)
START ON PATHWAY REGIMEN - Breast     A cycle is every 21 days:     Docetaxel      Carboplatin      Trastuzumab      Trastuzumab      Pertuzumab      Pertuzumab   **Always confirm dose/schedule in your pharmacy ordering system**    Patient Characteristics: Postoperative without Neoadjuvant Therapy (Pathologic Staging), Invasive Disease, Adjuvant Therapy, HER2 Positive, ER Positive, Node Positive Therapeutic Status: Postoperative without Neoadjuvant Therapy (Pathologic Staging) AJCC Grade: G2 AJCC N Category: pN2a AJCC M Category: cM0 ER Status: Positive (+) AJCC 8 Stage Grouping: IB HER2 Status: Positive (+) Oncotype Dx Recurrence Score: Not Appropriate AJCC T Category: pT3 PR Status: Positive (+) Intent of Therapy: Curative Intent, Discussed with Patient

## 2017-08-07 ENCOUNTER — Encounter: Payer: Self-pay | Admitting: *Deleted

## 2017-08-07 ENCOUNTER — Other Ambulatory Visit: Payer: PRIVATE HEALTH INSURANCE

## 2017-08-08 ENCOUNTER — Encounter: Payer: Self-pay | Admitting: Physical Therapy

## 2017-08-08 ENCOUNTER — Encounter: Payer: Self-pay | Admitting: Hematology

## 2017-08-08 ENCOUNTER — Ambulatory Visit: Payer: PRIVATE HEALTH INSURANCE | Attending: General Surgery | Admitting: Physical Therapy

## 2017-08-08 DIAGNOSIS — M6281 Muscle weakness (generalized): Secondary | ICD-10-CM | POA: Diagnosis present

## 2017-08-08 DIAGNOSIS — M25611 Stiffness of right shoulder, not elsewhere classified: Secondary | ICD-10-CM | POA: Diagnosis present

## 2017-08-08 DIAGNOSIS — Z483 Aftercare following surgery for neoplasm: Secondary | ICD-10-CM | POA: Insufficient documentation

## 2017-08-08 DIAGNOSIS — R600 Localized edema: Secondary | ICD-10-CM | POA: Insufficient documentation

## 2017-08-08 DIAGNOSIS — R293 Abnormal posture: Secondary | ICD-10-CM | POA: Insufficient documentation

## 2017-08-08 DIAGNOSIS — M25612 Stiffness of left shoulder, not elsewhere classified: Secondary | ICD-10-CM | POA: Diagnosis present

## 2017-08-08 NOTE — Progress Notes (Signed)
Received forwarded message from [place] of patient with insurance questions.  Called patient to introduce myself as Arboriculturist. Asked patient what type of concerns she had. Patient states she needed assistance regarding understanding insurance plan. Asked patient if she contacted insurance. She states she did and she explained what she was told. Patient states [name] was working on getting her scans scheduled at another facility that would be covered by her insurance.She was waiting to hear back regarding echo.  I explained to patient my role and how we assist with copay and financial assistance. Patient thought she has satisfied her deductible and OOP other than for copays for medicines and office visits.  Asked patient if she would like to apply for copay assistance just in case for Herceptin and Neulasta. She states that would be fine.   Enrolled patient in [place] program for Neulasta. Patient approved for up to $10,000 covering first injection at 1005 and only leaving a $25 copay after insurance.  Patient approved for copay assistance through [facility] for Herceptin. Patient approved for $25,000 08/08/17-08/07/18 and will only have a $5 copay after insurance if needed.        Patient approved for copay assistance through [place] for [place].  Patient approved for $25,000 08/08/17-08/07/18 and will only have a $5 copay after insurance if needed.  Called patient back to discuss approvals and [employer]. Patient will bring proof of income to apply for [place] on 08/28/17. Patient given my contact name and number for any additional financial questions or concerns.

## 2017-08-08 NOTE — Therapy (Signed)
[place], [place], [address] Phone: [phone]   Fax:  [phone]  Physical Therapy Evaluation  Patient Details  Name: Sheila [name] MRN: [id number] Date of Birth: [birth year] Referring Provider: Dr. [name]  Encounter Date: 08/08/2017      PT End of Session - 08/08/17 0835    Visit Number 1   Number of Visits 9   Date for PT Re-Evaluation 09/05/17   PT Start Time 0803   PT Stop Time 6767  no treatment performed today due to recent drain removal   PT Time Calculation (min) 30 min   Activity Tolerance Patient tolerated treatment well   Behavior During Therapy [facility] for tasks assessed/performed      Past Medical History:  Diagnosis Date  . Anxiety   . Cancer [facility])    right breast   . Family history of breast cancer   . Family history of colon cancer   . GERD (gastroesophageal reflux disease)   . Headache    couple of migraines in the past  . History of kidney stones   . Vitamin D deficiency     Past Surgical History:  Procedure Laterality Date  . egg donation    . kidney stone removal    . MASTECTOMY W/ SENTINEL NODE BIOPSY Bilateral 07/22/2017   Procedure: RIGHT TOTAL MASTECTOMY WITH RIGHT AXILLARY SENTINEL LYMPH NODE BIOPSY, LEFT PROPHYLACTIC MASTECTOMY;  Surgeon: [name], MD;  Location: [place];  Service: General;  Laterality: Bilateral;  . PORTACATH PLACEMENT Right 07/22/2017   Procedure: INSERTION PORT-A-CATH WITH [name];  Surgeon: [name], MD;  Location: [facility];  Service: General;  Laterality: Right;    There were no vitals filed for this visit.       Subjective Assessment - 08/08/17 0810    Subjective I had bilateral mastectomies on July 22, 2017. I had a sentinel lymph node biopsy on the right side. I had to also have 2  or 3 additional nodes removed because they were swollen. I got my drains out on Tuesday. I have been doing the post op exercises  about once a day. I have trouble washing my hair and back. It is difficult to put on clothes. It is hard to sleep due to the numbness on my right side and possible swelling. I can not reach things very well with my right arm.    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 It measures 5.5 cm and is located in the upper outer quadrant with a Ki67 of 12%. , Bilateral mastectomies on 07/22/17 with SLNB on R with 2 to 3 additional nodes taken, pt has to complete chemo and radiation and will begin chemo on Aug 29, 2017   Patient Stated Goals to return to prior level of function, be able to sleep with no discomfort, get ROM back to where it was   Currently in Pain? No/denies   Pain Score 0-No pain            [facility] PT Assessment - 08/08/17 0001      Assessment   Medical Diagnosis Right breast cancer   Referring Provider Dr. [name]   Onset Date/Surgical Date 07/22/17   Hand Dominance Right   Prior Therapy none     Precautions   Precautions Other (comment)  at risk for lymphedema   Precaution Comments --     Restrictions   Weight  Bearing Restrictions No     Balance Screen   Has the patient fallen in the past 6 months Yes   How many times? 1  tripped over shoes   Has the patient had a decrease in activity level because of a fear of falling?  No   Is the patient reluctant to leave their home because of a fear of falling?  No     Home Ecologist residence   Living Arrangements Spouse/significant other   Available Help at Discharge Family   Type of [place] to enter   Entrance Stairs-Number of Steps 24   Entrance Stairs-Rails Can reach both   [place] One level   [place] None  pt has access to these things     Prior Function   Level of Independence Independent with basic ADLs  needs help opening heavy doors   Vocation Full  time employment   Estate agent work; Therapist, art   Leisure She walks 10 minutes per day, prior to surgery     Cognition   Overall Cognitive Status Within Functional Limits for tasks assessed     Posture/Postural Control   Posture/Postural Control Postural limitations   Postural Limitations Forward head;Rounded Shoulders     ROM / Strength   AROM / PROM / Strength AROM     AROM   Right Shoulder Extension --   Right Shoulder Flexion 98 Degrees   Right Shoulder ABduction 74 Degrees   Right Shoulder Internal Rotation 48 Degrees   Right Shoulder External Rotation 81 Degrees   Left Shoulder Extension --   Left Shoulder Flexion 125 Degrees   Left Shoulder ABduction 86 Degrees   Left Shoulder Internal Rotation 72 Degrees   Left Shoulder External Rotation 77 Degrees   Cervical Flexion --   Cervical Extension --   Cervical - Right Side Bend --   Cervical - Left Side Bend --   Cervical - Right Rotation --   Cervical - Left Rotation --     Strength   Overall Strength Deficits;Due to pain           LYMPHEDEMA/ONCOLOGY QUESTIONNAIRE - 08/08/17 0825      Right Upper Extremity Lymphedema   10 cm Proximal to Olecranon Process 27.2 cm   Olecranon Process 23.2 cm   10 cm Proximal to Ulnar Styloid Process 20.5 cm   Just Proximal to Ulnar Styloid Process 15.5 cm   Across Hand at [employer] 19 cm   At [place] of 2nd Digit 6 cm           Quick Dash - 08/08/17 0001    Open a tight or new jar Moderate difficulty   Do heavy household chores (wash walls, wash floors) Moderate difficulty   Carry a shopping bag or briefcase Mild difficulty   Wash your back Moderate difficulty   Use a knife to cut food Mild difficulty   Recreational activities in which you take some force or impact through your arm, shoulder, or hand (golf, hammering, tennis) Moderate difficulty   During the past week, to what extent has your arm, shoulder or hand problem interfered with your  normal social activities with family, friends, neighbors, or groups? Modererately   During the past week, to what extent has your arm, shoulder or hand problem limited your work or other regular daily activities Slightly   Arm, shoulder, or hand pain. Moderate   Tingling (pins  and needles) in your arm, shoulder, or hand None   Difficulty Sleeping Moderate difficulty   DASH Score 38.64 %      Objective measurements completed on examination: See above findings.                     [facility] Goals - 08/08/17 [phone]      CC Long Term Goal  #1   Title Pt to demonstrate 165 degrees of bilateral shoulder flexion to allow her to reach up and overhead   Baseline R 98, L 125   Time 4   Period Weeks   Status New   Target Date 09/05/17     CC Long Term Goal  #2   Title Pt to demonstrate 165 degrees of bilateral shoulder abduction to allow pt to reach out to sides   Baseline R 74, L 86   Time 4   Period Weeks   Status New   Target Date 09/05/17     CC Long Term Goal  #3   Title Pt to demonstrate 70 degrees of right shoulder internal rotation to allow her to return to prior level of function   Baseline 48   Time 4   Period Weeks   Status New   Target Date 09/05/17     CC Long Term Goal  #4   Title Pt to report a 75% improvement in right trunk edema to allow improved comfort and sleep   Time 4   Period Weeks   Status New   Target Date 09/05/17     CC Long Term Goal  #5   Title Pt to be independent in a home exercise program for continued strengthening and stretching   Time 4   Period Weeks   Status New             Plan - 08/08/17 0300    Clinical Impression Statement Pt underwent bilateral mastectomies on 07/22/17 with SLNB on R side. She had right breast cancer and a prophylactic left mastectomy. She will begin chemotherapy in October 2018 and then radiation and eventually anti-estrogen therapy. She presents with decreased range of motion in bilateral  shoulders and has difficulty dressing herself, washing her hair and reaching for things. Her scars are still healing and pt had her drains removed on Tuesday. No treatment performed today since it has not been a week since pt had drains removed. Pt also demonstrates some swelling in right lateral trunk inferior to axilla. Pt would benefit from skilled PT services to increase bilateral shoulder ROM and strength and to decrease right trunk swelling.    History and Personal Factors relevant to plan of care: pt is right handed   Clinical Presentation Evolving   Clinical Presentation due to: pt is to begin chemotherapy in mid October 2018   Clinical Decision Making Moderate   Rehab Potential Good   Clinical Impairments Affecting Rehab Potential pt to begin chemo next month   PT Frequency 2x / week   PT Duration 4 weeks   PT Treatment/Interventions ADLs/Self Care Home Management;Therapeutic activities;Therapeutic exercise;Orthotic Fit/Training;Patient/family education;Manual techniques;Manual lymph drainage;Scar mobilization;Passive range of motion;Taping   PT Next Visit Plan begin with supine dowel exercises for HEP, begin gentle AA/A/PROM to bilateral shoulders, give chip pack for truncal swelling   PT Home Exercise Plan Post op shoulder ROM HEP advised pt to not lift arms above 90 degrees until a week after drains removed   Consulted and Agree with Plan  of Care Patient      Patient will benefit from skilled therapeutic intervention in order to improve the following deficits and impairments:  Postural dysfunction, Decreased knowledge of precautions, Pain, Impaired UE functional use, Decreased range of motion, Decreased strength, Decreased scar mobility, Increased fascial restricitons  Visit Diagnosis: Stiffness of left shoulder, not elsewhere classified - Plan: PT plan of care cert/re-cert  Stiffness of right shoulder, not elsewhere classified - Plan: PT plan of care cert/re-cert  Aftercare  following surgery for neoplasm - Plan: PT plan of care cert/re-cert  Abnormal posture - Plan: PT plan of care cert/re-cert  Muscle weakness (generalized) - Plan: PT plan of care cert/re-cert  Localized edema - Plan: PT plan of care cert/re-cert     Problem List Patient Active Problem List   Diagnosis Date Noted  . Genetic testing 06/25/2017  . Family history of breast cancer   . Family history of colon cancer   . Malignant neoplasm of upper-outer quadrant of right breast in female, estrogen receptor positive ([place]) 06/16/2017    Allyson Sabal [facility] 08/08/2017, 8:48 AM  [place], [place], [address] Phone: [phone]   Fax:  [phone]  Name: Sheila [name] MRN: [id number] Date of Birth: Dec 11, 1975  Manus Gunning, PT 08/08/17 8:49 AM

## 2017-08-12 ENCOUNTER — Encounter (HOSPITAL_COMMUNITY): Payer: Self-pay | Admitting: Cardiology

## 2017-08-12 ENCOUNTER — Ambulatory Visit (HOSPITAL_COMMUNITY)
Admission: RE | Admit: 2017-08-12 | Discharge: 2017-08-12 | Disposition: A | Discharge status: Home / Self Care | Payer: PRIVATE HEALTH INSURANCE | Source: Ambulatory Visit | Attending: Cardiology | Admitting: Cardiology

## 2017-08-12 ENCOUNTER — Ambulatory Visit (HOSPITAL_BASED_OUTPATIENT_CLINIC_OR_DEPARTMENT_OTHER)
Admission: RE | Admit: 2017-08-12 | Discharge: 2017-08-12 | Disposition: A | Discharge status: Home / Self Care | Payer: PRIVATE HEALTH INSURANCE | Source: Ambulatory Visit | Attending: Cardiology | Admitting: Cardiology

## 2017-08-12 VITALS — BP 112/74 | HR 97 | Wt 158.8 lb

## 2017-08-12 DIAGNOSIS — C50411 Malignant neoplasm of upper-outer quadrant of right female breast: Secondary | ICD-10-CM

## 2017-08-12 DIAGNOSIS — Z170 Estrogen receptor positive status [ER+]: Secondary | ICD-10-CM

## 2017-08-12 NOTE — Progress Notes (Signed)
  Echocardiogram 2D Echocardiogram has been performed.  Merrie Roof F 08/12/2017, 3:05 PM

## 2017-08-12 NOTE — Patient Instructions (Signed)
Your physician has requested that you have an echocardiogram. Echocardiography is a painless test that uses sound waves to create images of your heart. It provides your doctor with information about the size and shape of your heart and how well your heart's chambers and valves are working. This procedure takes approximately one hour. There are no restrictions for this procedure.   Your physician recommends that you schedule a follow-up appointment in: 3 months with echocardiogram   

## 2017-08-13 NOTE — Progress Notes (Signed)
Oncology: Dr. Burr Medico  41 yo with history of breath cancer was referred by Dr. Burr Medico for cardio-oncology evaluation given planned Herceptin use.  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  She had bilateral mastectomies and axillary node biopsy in 7/18.  Planned for Taxol/carboplatin/Herceptin/Perjeta q 3 wks x 6 cycles starting 10/12, then maintenance Herceptin to complete a year.   No history of cardiac problems.  Nonsmoker. No exertional dyspnea or chest pain.    PMH: 1. Breast cancer: Diagnosed 7/18 on right, ER+/PR+/HER2+.  She had bilateral mastectomies and axillary node biopsy in 7/18.  Planned for Taxol/carboplatin/Herceptin/Perjeta q 3 wks x 6 cycles starting 10/12, then maintenance Herceptin to complete a year.  - Echo (9/18): EF 94-07%, normal diastolic function, GLS -68.0%.   Family History  Problem Relation Age of Onset  . Breast cancer Paternal Grandmother 56  . Colon cancer Paternal Grandmother 55  . Breast cancer Mother 34  . Prostate cancer Paternal Grandfather        dx in his 55s  . Leukemia Paternal Grandfather   . Lung cancer Maternal Uncle        heavy smoker  . Other Paternal Uncle        farm accident  . Stroke Maternal Grandfather    Social History   Social History  . Marital status: Single    Spouse name: N/A  . Number of children: N/A  . Years of education: N/A   Occupational History  . Not on file.   Social History Main Topics  . Smoking status: Never Smoker  . Smokeless tobacco: Former Systems developer  . Alcohol use Yes     Comment: once a year  . Drug use: No  . Sexual activity: Yes   Other Topics Concern  . Not on file   Social History Narrative  . No narrative on file   ROS: All systems reviewed and negative except as per HPI.  Current Outpatient Prescriptions  Medication Sig Dispense Refill  . calcium carbonate (TUMS - DOSED IN MG ELEMENTAL CALCIUM) 500 MG chewable tablet Chew 2 tablets by mouth as needed for indigestion or  heartburn.     . Cholecalciferol (VITAMIN D) 2000 units tablet Take 2,000 Units by mouth daily.    . diphenhydramine-acetaminophen (TYLENOL PM) 25-500 MG TABS tablet Take 2 tablets by mouth at bedtime. sleep    . fluticasone (FLONASE) 50 MCG/ACT nasal spray Place 1 spray into both nostrils daily.    Marland Kitchen omeprazole (PRILOSEC) 20 MG capsule Take 20 mg by mouth at bedtime.     . traMADol (ULTRAM) 50 MG tablet Take 50 mg by mouth every 6 (six) hours as needed. for pain  0  . dexamethasone (DECADRON) 4 MG tablet Take 2 tablets (8 mg total) by mouth 2 (two) times daily. Start the day before Taxotere. Then again the day after chemo for 3 days. (Patient not taking: Reported on 08/12/2017) 30 tablet 1  . lidocaine-prilocaine (EMLA) cream Apply to affected area once (Patient not taking: Reported on 08/12/2017) 30 g 3  . naproxen sodium (ANAPROX) 220 MG tablet Take 220 mg by mouth 2 (two) times daily with a meal.    . ondansetron (ZOFRAN) 8 MG tablet Take 1 tablet (8 mg total) by mouth 2 (two) times daily as needed for refractory nausea / vomiting. Start on day 3 after chemo. (Patient not taking: Reported on 08/12/2017) 30 tablet 1  . prochlorperazine (COMPAZINE) 10 MG tablet Take 1 tablet (10 mg total) by  mouth every 6 (six) hours as needed (Nausea or vomiting). (Patient not taking: Reported on 08/12/2017) 30 tablet 1   No current facility-administered medications for this encounter.    BP 112/74   Pulse 97   Wt 158 lb 12.8 oz (72 kg)   LMP 07/15/2017   SpO2 97%   BMI 25.63 kg/m  General: NAD Neck: No JVD, no thyromegaly or thyroid nodule.  Lungs: Clear to auscultation bilaterally with normal respiratory effort. CV: Nondisplaced PMI.  Heart regular S1/S2, no S3/S4, no murmur.  No peripheral edema.  No carotid bruit.  Normal pedal pulses.  Abdomen: Soft, nontender, no hepatosplenomegaly, no distention.  Skin: Intact without lesions or rashes.  Neurologic: Alert and oriented x 3.  Psych: Normal  affect. Extremities: No clubbing or cyanosis.  HEENT: Normal.   Assessment/Plan:  41 yo with history of breast cancer, planned to undergo a Herceptin-containing treatment regimen.  She will likely continue Herceptin for a year.  We discussed the potential cardiotoxic effect of Herceptin and the rationale behind echo screening.  I reviewed today's echo, this was a normal study.  - She will return in 3 months for repeat echo to screen for Herceptin-related cardiotoxicity.   Loralie Champagne 08/13/2017

## 2017-08-15 ENCOUNTER — Other Ambulatory Visit: Payer: Self-pay | Admitting: Hematology

## 2017-08-15 ENCOUNTER — Other Ambulatory Visit: Payer: Self-pay | Admitting: Nurse Practitioner

## 2017-08-15 DIAGNOSIS — Z170 Estrogen receptor positive status [ER+]: Principal | ICD-10-CM

## 2017-08-15 DIAGNOSIS — C50411 Malignant neoplasm of upper-outer quadrant of right female breast: Secondary | ICD-10-CM

## 2017-08-18 ENCOUNTER — Other Ambulatory Visit: Payer: Self-pay | Admitting: Hematology

## 2017-08-18 DIAGNOSIS — Z170 Estrogen receptor positive status [ER+]: Principal | ICD-10-CM

## 2017-08-18 DIAGNOSIS — K769 Liver disease, unspecified: Secondary | ICD-10-CM

## 2017-08-18 DIAGNOSIS — C50411 Malignant neoplasm of upper-outer quadrant of right female breast: Secondary | ICD-10-CM

## 2017-08-19 ENCOUNTER — Ambulatory Visit: Payer: PRIVATE HEALTH INSURANCE | Attending: General Surgery | Admitting: Physical Therapy

## 2017-08-19 DIAGNOSIS — M25611 Stiffness of right shoulder, not elsewhere classified: Secondary | ICD-10-CM

## 2017-08-19 DIAGNOSIS — R293 Abnormal posture: Secondary | ICD-10-CM | POA: Diagnosis present

## 2017-08-19 DIAGNOSIS — Z483 Aftercare following surgery for neoplasm: Secondary | ICD-10-CM | POA: Diagnosis present

## 2017-08-19 DIAGNOSIS — R600 Localized edema: Secondary | ICD-10-CM | POA: Diagnosis present

## 2017-08-19 DIAGNOSIS — M6281 Muscle weakness (generalized): Secondary | ICD-10-CM

## 2017-08-19 DIAGNOSIS — M25612 Stiffness of left shoulder, not elsewhere classified: Secondary | ICD-10-CM | POA: Diagnosis not present

## 2017-08-19 NOTE — Patient Instructions (Addendum)
  SHOULDER: Flexion - Supine (Cane)        Cancer Rehab [phone]    Hold cane in both hands. Raise arms up overhead. Do not allow back to arch. Hold _5__ seconds. Do __5-10__ times; __1-2__ times a day.   SELF ASSISTED WITH OBJECT: Shoulder Abduction / Adduction - Supine    Hold cane with both hands. Move both arms from side to side, keep elbows straight.  Hold when stretch felt for __5__ seconds. Repeat __5-10__ times; __1-2__ times a day. Once this becomes easier progress to third picture bringing affected arm towards ear by staying out to side. Same hold for _5_seconds. Repeat  _5-10_ times, _1-2_ times/day.  Shoulder Blade Stretch    Clasp fingers behind head with elbows touching in front of face. Pull elbows back while pressing shoulder blades together. Relax and hold as tolerated, can place pillow under elbow here for comfort as needed and to allow for prolonged stretch.  Repeat __5__ times. Do __1-2__ sessions per day.     SHOULDER: External Rotation - Supine (Cane)    Hold cane with both hands. Rotate arm away from body. Keep elbow on floor and next to body. _5-10__ reps per set, hold 5 seconds, _1-2__ sets per day. Add towel to keep elbow at side.  Copyright  VHI. All rights reserved.      Wear chip back inside pink bandeau over incision on your side for one to two hours a day  Deep breathing

## 2017-08-19 NOTE — Therapy (Signed)
[place], [place], [address] Phone: [phone]   Fax:  [phone]  Physical Therapy Treatment  Patient Details  Name: Sheila [name] MRN: [id number] Date of Birth: 07-27-1976 Referring Provider: Dr. [name]  Encounter Date: 08/19/2017      PT End of Session - 08/19/17 1611    Visit Number 2   Number of Visits 9   Date for PT Re-Evaluation 09/05/17   PT Start Time 7425   PT Stop Time 1600   PT Time Calculation (min) 45 min   Activity Tolerance Patient tolerated treatment well   Behavior During Therapy [facility] for tasks assessed/performed      Past Medical History:  Diagnosis Date  . Anxiety   . Cancer [facility])    right breast   . Family history of breast cancer   . Family history of colon cancer   . GERD (gastroesophageal reflux disease)   . Headache    couple of migraines in the past  . History of kidney stones   . Vitamin D deficiency     Past Surgical History:  Procedure Laterality Date  . egg donation    . kidney stone removal    . MASTECTOMY W/ SENTINEL NODE BIOPSY Bilateral 07/22/2017   Procedure: RIGHT TOTAL MASTECTOMY WITH RIGHT AXILLARY SENTINEL LYMPH NODE BIOPSY, LEFT PROPHYLACTIC MASTECTOMY;  Surgeon: [name], MD;  Location: [place];  Service: General;  Laterality: Bilateral;  . PORTACATH PLACEMENT Right 07/22/2017   Procedure: INSERTION PORT-A-CATH WITH [name];  Surgeon: [name], MD;  Location: [place];  Service: General;  Laterality: Right;    There were no vitals filed for this visit.      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 It measures 5.5 cm and is located in the upper outer quadrant with a Ki67 of 12%. , Bilateral mastectomies on 07/22/17 with  SLNB on R with 2 to 3 additional nodes taken, pt has to complete chemo and radiation and will begin chemo on Aug 29, 2017   Patient Stated Goals to return to prior level of function, be able to sleep with no discomfort, get ROM back to where it was   Currently in Pain? No/denies  soreness across the chest             [facility] PT Assessment - 08/19/17 0001      Observation/Other Assessments   Observations pt still has steri strips on anterior chest and had visible fullness in lateral chest                      [facility] Adult PT Treatment/Exercise - 08/19/17 0001      Self-Care   Self-Care Other Self-Care Comments   Other Self-Care Comments  provided small chip pack to wear inside pink binder or tank top for 1-2 hours at time      Shoulder Exercises: Supine   Protraction AROM;Right;Left;5 reps   Horizontal ABduction AAROM;Right;5 reps  within pain limits, supported arm on pillow    External Rotation PROM;Right;Left;10 reps   Flexion AAROM;Both;10 reps  with dowel rod    ABduction AAROM;Right;5 reps  within pain limit    Other Supine Exercises left arm pointed to ceiling for small circles x 10  reps    Other Supine Exercises hand to ceiling for triceps "skull crushers" x 10 reps      Manual Therapy   Manual Therapy Manual Lymphatic Drainage (MLD);Passive ROM   Manual Lymphatic Drainage (MLD) briefly, in supine, short neck, superficial and deep abdominals, right groin and right axillo inguinal anastamosis being careful not to apply traction to incision, then to left sidelying for posterior interaxillary anastamosis and lateral chest    Passive ROM within pain limits for left shoulder in flexion, abduction and horizontal abduction                 PT Education - 08/19/17 1611    Education provided Yes   Education Details supine dowel rod exercise, deep breathing, how to wear chip pack    Person(s) Educated Patient   Methods Demonstration;Handout;Explanation    Comprehension Verbalized understanding;Returned demonstration              Breast Clinic Goals - 06/18/17 1221      Patient will be able to verbalize understanding of pertinent lymphedema risk reduction practices relevant to her diagnosis specifically related to skin care.   Time 1   Period Days   Status Achieved     Patient will be able to return demonstrate and/or verbalize understanding of the post-op home exercise program related to regaining shoulder range of motion.   Time 1   Period Days   Status Achieved     Patient will be able to verbalize understanding of the importance of attending the postoperative After Breast Cancer Class for further lymphedema risk reduction education and therapeutic exercise.   Time 1   Period Days   Status Achieved          Long Term Clinic Goals - 08/08/17 0623      CC Long Term Goal  #1   Title Pt to demonstrate 165 degrees of bilateral shoulder flexion to allow her to reach up and overhead   Baseline R 98, L 125   Time 4   Period Weeks   Status New   Target Date 09/05/17     CC Long Term Goal  #2   Title Pt to demonstrate 165 degrees of bilateral shoulder abduction to allow pt to reach out to sides   Baseline R 74, L 86   Time 4   Period Weeks   Status New   Target Date 09/05/17     CC Long Term Goal  #3   Title Pt to demonstrate 70 degrees of right shoulder internal rotation to allow her to return to prior level of function   Baseline 48   Time 4   Period Weeks   Status New   Target Date 09/05/17     CC Long Term Goal  #4   Title Pt to report a 75% improvement in right trunk edema to allow improved comfort and sleep   Time 4   Period Weeks   Status New   Target Date 09/05/17     CC Long Term Goal  #5   Title Pt to be independent in a home exercise program for continued strengthening and stretching   Time 4   Period Weeks   Status New            Plan - 08/19/17 1612    Clinical Impression Statement Pt  continues with swelling in right trunk and has pain with ROM, especially in right shoulder.  Treatment post op swelling with MLD and  provided chip pack that pt states she will try weaing with her pink bandeau ( she has not been wearing it.) She did well with initial dowel rod exercise, but continues to wince in pain with most other motions    Clinical Impairments Affecting Rehab Potential pt to begin chemo Oct 12 and will have it every 3 weeks    PT Frequency 2x / week   PT Duration 4 weeks   PT Treatment/Interventions ADLs/Self Care Home Management;Therapeutic activities;Therapeutic exercise;Orthotic Fit/Training;Patient/family education;Manual techniques;Manual lymph drainage;Scar mobilization;Passive range of motion;Taping   PT Next Visit Plan assess chip pack for truncal swelling and dowel rod exercise.  If she is able, try pulleys and wall slides for stretching.  add active exercsie as able.  later teach Strength ABC program.  sign pt up for ABC class    Consulted and Agree with Plan of Care Patient      Patient will benefit from skilled therapeutic intervention in order to improve the following deficits and impairments:  Postural dysfunction, Decreased knowledge of precautions, Pain, Impaired UE functional use, Decreased range of motion, Decreased strength, Decreased scar mobility, Increased fascial restricitons  Visit Diagnosis: Stiffness of left shoulder, not elsewhere classified  Stiffness of right shoulder, not elsewhere classified  Aftercare following surgery for neoplasm  Abnormal posture  Muscle weakness (generalized)  Localized edema     Problem List Patient Active Problem List   Diagnosis Date Noted  . Genetic testing 06/25/2017  . Family history of breast cancer   . Family history of colon cancer   . Malignant neoplasm of upper-outer quadrant of right breast in female, estrogen receptor positive ([place]) 06/16/2017   Donato Heinz. Owens Shark PT  Norwood Levo 08/19/2017, 4:19 PM  [place], [place], [address] Phone: [phone]   Fax:  [phone]  Name: Kamariyah Timberlake MRN: [id number] Date of Birth: 06/09/1976

## 2017-08-20 ENCOUNTER — Other Ambulatory Visit: Payer: PRIVATE HEALTH INSURANCE

## 2017-08-21 ENCOUNTER — Ambulatory Visit: Payer: PRIVATE HEALTH INSURANCE | Admitting: Physical Therapy

## 2017-08-21 ENCOUNTER — Encounter: Payer: Self-pay | Admitting: Physical Therapy

## 2017-08-21 DIAGNOSIS — R600 Localized edema: Secondary | ICD-10-CM

## 2017-08-21 DIAGNOSIS — M25612 Stiffness of left shoulder, not elsewhere classified: Secondary | ICD-10-CM | POA: Diagnosis not present

## 2017-08-21 DIAGNOSIS — Z483 Aftercare following surgery for neoplasm: Secondary | ICD-10-CM

## 2017-08-21 DIAGNOSIS — M25611 Stiffness of right shoulder, not elsewhere classified: Secondary | ICD-10-CM

## 2017-08-21 NOTE — Therapy (Signed)
[place], [place], [address] Phone: [phone]   Fax:  [phone]  Physical Therapy Treatment  Patient Details  Name: Lochlyn Zullo MRN: [id number] Date of Birth: 1976/04/24 Referring Provider: Dr. [name]  Encounter Date: 08/21/2017      PT End of Session - 08/21/17 1713    Visit Number 3   Number of Visits 9   Date for PT Re-Evaluation 09/05/17   PT Start Time 8502   PT Stop Time 1605   PT Time Calculation (min) 42 min   Activity Tolerance Patient tolerated treatment well   Behavior During Therapy [facility] for tasks assessed/performed      Past Medical History:  Diagnosis Date  . Anxiety   . Cancer [facility])    right breast   . Family history of breast cancer   . Family history of colon cancer   . GERD (gastroesophageal reflux disease)   . Headache    couple of migraines in the past  . History of kidney stones   . Vitamin D deficiency     Past Surgical History:  Procedure Laterality Date  . egg donation    . kidney stone removal    . MASTECTOMY W/ SENTINEL NODE BIOPSY Bilateral 07/22/2017   Procedure: RIGHT TOTAL MASTECTOMY WITH RIGHT AXILLARY SENTINEL LYMPH NODE BIOPSY, LEFT PROPHYLACTIC MASTECTOMY;  Surgeon: [name], MD;  Location: [place];  Service: General;  Laterality: Bilateral;  . PORTACATH PLACEMENT Right 07/22/2017   Procedure: INSERTION PORT-A-CATH WITH [name];  Surgeon: [name], MD;  Location: [place];  Service: General;  Laterality: Right;    There were no vitals filed for this visit.      Subjective Assessment - 08/21/17 1525    Subjective My swelling might have gone down a little bit. It feels like it is a little bit less but still annoying. I still have tightness with my incision.    Patient Stated Goals to return to prior level of function, be able to sleep with no discomfort, get ROM back to where it was   Currently in Pain? Yes  soreness across  chest   Pain Score 2    Pain Location Chest   Pain Orientation Right;Left   Pain Descriptors / Indicators Sore   Pain Type Surgical pain                         [facility] Adult PT Treatment/Exercise - 08/21/17 0001      Manual Therapy   Manual Lymphatic Drainage (MLD) in supine, short neck, superficial and deep abdominals, right groin and right axillo inguinal anastamosis being careful not to apply traction to incision, then to left sidelying for posterior interaxillary anastamosis and lateral chest    Passive ROM within pain limits for right shoulder in flexion, abduction and horizontal abduction                       Breast Clinic Goals - 06/18/17 1221      Patient will be able to verbalize understanding of pertinent lymphedema risk reduction practices relevant to her diagnosis specifically related to skin care.   Time 1   Period Days   Status Achieved     Patient will be able to return demonstrate and/or verbalize understanding of the post-op home exercise program related to regaining shoulder range of motion.   Time 1   Period Days   Status Achieved  Patient will be able to verbalize understanding of the importance of attending the postoperative After Breast Cancer Class for further lymphedema risk reduction education and therapeutic exercise.   Time 1   Period Days   Status Achieved          Long Term Clinic Goals - 08/08/17 6301      CC Long Term Goal  #1   Title Pt to demonstrate 165 degrees of bilateral shoulder flexion to allow her to reach up and overhead   Baseline R 98, L 125   Time 4   Period Weeks   Status New   Target Date 09/05/17     CC Long Term Goal  #2   Title Pt to demonstrate 165 degrees of bilateral shoulder abduction to allow pt to reach out to sides   Baseline R 74, L 86   Time 4   Period Weeks   Status New   Target Date 09/05/17     CC Long Term Goal  #3   Title Pt to demonstrate 70 degrees of right  shoulder internal rotation to allow her to return to prior level of function   Baseline 48   Time 4   Period Weeks   Status New   Target Date 09/05/17     CC Long Term Goal  #4   Title Pt to report a 75% improvement in right trunk edema to allow improved comfort and sleep   Time 4   Period Weeks   Status New   Target Date 09/05/17     CC Long Term Goal  #5   Title Pt to be independent in a home exercise program for continued strengthening and stretching   Time 4   Period Weeks   Status New            Plan - 08/21/17 1714    Clinical Impression Statement Pt states the swelling in her right trunk is most bothersome today. Focused on MLD since pt states this helped laster session. Continued with PROM to right shoulder with pain limits. Encouraged pt to wear chip pack more often to help reduce swelling. Pt stated her arm felt less tight at end of session.    Rehab Potential Good   Clinical Impairments Affecting Rehab Potential pt to begin chemo Oct 12 and will have it every 3 weeks    PT Frequency 2x / week   PT Duration 4 weeks   PT Treatment/Interventions ADLs/Self Care Home Management;Therapeutic activities;Therapeutic exercise;Orthotic Fit/Training;Patient/family education;Manual techniques;Manual lymph drainage;Scar mobilization;Passive range of motion;Taping   PT Next Visit Plan assess chip pack for truncal swelling and dowel rod exercise.  If she is able, try pulleys and wall slides for stretching.  add active exercsie as able.  later teach Strength ABC program.  sign pt up for ABC class    PT Home Exercise Plan Post op shoulder ROM HEP advised pt to not lift arms above 90 degrees until a week after drains removed, supine dowel   Consulted and Agree with Plan of Care Patient      Patient will benefit from skilled therapeutic intervention in order to improve the following deficits and impairments:  Postural dysfunction, Decreased knowledge of precautions, Pain, Impaired UE  functional use, Decreased range of motion, Decreased strength, Decreased scar mobility, Increased fascial restricitons  Visit Diagnosis: Stiffness of left shoulder, not elsewhere classified  Stiffness of right shoulder, not elsewhere classified  Aftercare following surgery for neoplasm  Localized edema  Problem List Patient Active Problem List   Diagnosis Date Noted  . Genetic testing 06/25/2017  . Family history of breast cancer   . Family history of colon cancer   . Malignant neoplasm of upper-outer quadrant of right breast in female, estrogen receptor positive ([place]) 06/16/2017    Allyson Sabal [facility] 08/21/2017, 5:16 PM  [place], [place], [address] Phone: [phone]   Fax:  [phone]  Name: Anabella Capshaw MRN: [id number] Date of Birth: 09-Sep-1976  Manus Gunning, PT 08/21/17 5:16 PM\

## 2017-08-25 ENCOUNTER — Encounter: Payer: Self-pay | Admitting: Physical Therapy

## 2017-08-25 ENCOUNTER — Ambulatory Visit: Payer: PRIVATE HEALTH INSURANCE | Admitting: Physical Therapy

## 2017-08-25 DIAGNOSIS — Z483 Aftercare following surgery for neoplasm: Secondary | ICD-10-CM

## 2017-08-25 DIAGNOSIS — R293 Abnormal posture: Secondary | ICD-10-CM

## 2017-08-25 DIAGNOSIS — M25612 Stiffness of left shoulder, not elsewhere classified: Secondary | ICD-10-CM

## 2017-08-25 DIAGNOSIS — M25611 Stiffness of right shoulder, not elsewhere classified: Secondary | ICD-10-CM

## 2017-08-25 NOTE — Therapy (Signed)
[place], [place], [address] Phone: [phone]   Fax:  [phone]  Physical Therapy Treatment  Patient Details  Name: Sheila [name] MRN: [id number] Date of Birth: 07-Mar-1976 Referring Provider: Dr. [name]  Encounter Date: 08/25/2017      PT End of Session - 08/25/17 0933    Visit Number 4   Number of Visits 9   Date for PT Re-Evaluation 09/05/17   PT Start Time 0845   PT Stop Time 0931   PT Time Calculation (min) 46 min   Activity Tolerance Patient tolerated treatment well   Behavior During Therapy [facility] for tasks assessed/performed      Past Medical History:  Diagnosis Date  . Anxiety   . Cancer [facility])    right breast   . Family history of breast cancer   . Family history of colon cancer   . GERD (gastroesophageal reflux disease)   . Headache    couple of migraines in the past  . History of kidney stones   . Vitamin D deficiency     Past Surgical History:  Procedure Laterality Date  . egg donation    . kidney stone removal    . MASTECTOMY W/ SENTINEL NODE BIOPSY Bilateral 07/22/2017   Procedure: RIGHT TOTAL MASTECTOMY WITH RIGHT AXILLARY SENTINEL LYMPH NODE BIOPSY, LEFT PROPHYLACTIC MASTECTOMY;  Surgeon: [name], MD;  Location: [place];  Service: General;  Laterality: Bilateral;  . PORTACATH PLACEMENT Right 07/22/2017   Procedure: INSERTION PORT-A-CATH WITH [name];  Surgeon: [name], MD;  Location: [place];  Service: General;  Laterality: Right;    There were no vitals filed for this visit.      Subjective Assessment - 08/25/17 0846    Subjective I started driving over the weekend. I did okay but it hurt shifting into second gear. My swelling seems to have gone down a little bit but the tightness is still there.    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 It measures 5.5 cm  and is located in the upper outer quadrant with a Ki67 of 12%. , Bilateral mastectomies on 07/22/17 with SLNB on R with 2 to 3 additional nodes taken, pt has to complete chemo and radiation and will begin chemo on Aug 29, 2017   Patient Stated Goals to return to prior level of function, be able to sleep with no discomfort, get ROM back to where it was   Currently in Pain? No/denies   Pain Score 0-No pain            [facility] PT Assessment - 08/25/17 0001      AROM   Right Shoulder Flexion 122 Degrees   Right Shoulder ABduction 92 Degrees  103 after stretching   Left Shoulder Flexion 141 Degrees   Left Shoulder ABduction 141 Degrees                     [facility] Adult PT Treatment/Exercise - 08/25/17 0001      Shoulder Exercises: Pulleys   Flexion 2 minutes  with stretch at end range   ABduction 2 minutes  with stretch at end range     Shoulder Exercises: Therapy Ball   Flexion 10 reps   ABduction 10 reps  on left and right     Manual Therapy   Passive ROM within pain limits for right and left shoulder in flexion, abduction,  ER, and IR                      Breast Clinic Goals - 06/18/17 1221      Patient will be able to verbalize understanding of pertinent lymphedema risk reduction practices relevant to her diagnosis specifically related to skin care.   Time 1   Period Days   Status Achieved     Patient will be able to return demonstrate and/or verbalize understanding of the post-op home exercise program related to regaining shoulder range of motion.   Time 1   Period Days   Status Achieved     Patient will be able to verbalize understanding of the importance of attending the postoperative After Breast Cancer Class for further lymphedema risk reduction education and therapeutic exercise.   Time 1   Period Days   Status Achieved          Long Term Clinic Goals - 08/25/17 0901      CC Long Term Goal  #1   Title Pt to demonstrate 165 degrees  of bilateral shoulder flexion to allow her to reach up and overhead   Baseline R 98, L 125, 08/25/17- R 122, L 141   Time 4   Period Weeks   Status On-going     CC Long Term Goal  #2   Title Pt to demonstrate 165 degrees of bilateral shoulder abduction to allow pt to reach out to sides   Baseline R 74, L 86, 08/25/17- R 92, L 141   Time 4   Period Weeks   Status On-going     CC Long Term Goal  #3   Title Pt to demonstrate 70 degrees of right shoulder internal rotation to allow her to return to prior level of function   Baseline 48, 08/25/17- 51   Time 4   Period Weeks   Status On-going     CC Long Term Goal  #4   Title Pt to report a 75% improvement in right trunk edema to allow improved comfort and sleep   Baseline 08/25/17- 60%    Time 4   Period Weeks   Status On-going     CC Long Term Goal  #5   Title Pt to be independent in a home exercise program for continued strengthening and stretching   Time 4   Period Weeks   Status On-going            Plan - 08/25/17 0933    Clinical Impression Statement Assessed pt's progress towards goals in therapy today. Pt demonstrates excellent progress towards all goals. Her ROM has improved greatly and following PROM her right shoulder abduction improved another 11 degrees. She also reports 60% improvement in right trunk swelling. Added AAROM exercises this session. Pt is signed up for ABC class on the 15th.    Rehab Potential Good   Clinical Impairments Affecting Rehab Potential pt to begin chemo Oct 12 and will have it every 3 weeks    PT Frequency 2x / week   PT Duration 4 weeks   PT Treatment/Interventions ADLs/Self Care Home Management;Therapeutic activities;Therapeutic exercise;Orthotic Fit/Training;Patient/family education;Manual techniques;Manual lymph drainage;Scar mobilization;Passive range of motion;Taping   PT Next Visit Plan continue pulleys and ball, do PROM to bilat shoulders,   add active exercsie as able.  later teach  Strength ABC program.    PT Home Exercise Plan Post op shoulder ROM HEP advised pt to not lift arms above 90 degrees until  a week after drains removed, supine dowel   Consulted and Agree with Plan of Care Patient      Patient will benefit from skilled therapeutic intervention in order to improve the following deficits and impairments:  Postural dysfunction, Decreased knowledge of precautions, Pain, Impaired UE functional use, Decreased range of motion, Decreased strength, Decreased scar mobility, Increased fascial restricitons  Visit Diagnosis: Stiffness of left shoulder, not elsewhere classified  Stiffness of right shoulder, not elsewhere classified  Aftercare following surgery for neoplasm  Abnormal posture     Problem List Patient Active Problem List   Diagnosis Date Noted  . Genetic testing 06/25/2017  . Family history of breast cancer   . Family history of colon cancer   . Malignant neoplasm of upper-outer quadrant of right breast in female, estrogen receptor positive ([place]) 06/16/2017    Allyson Sabal [facility] 08/25/2017, 9:37 AM  [place], [place], [address] Phone: [phone]   Fax:  [phone]  Name: Sheila [name] MRN: [id number] Date of Birth: 1976/08/31  Manus Gunning, PT 08/25/17 9:37 AM

## 2017-08-27 ENCOUNTER — Encounter: Payer: Self-pay | Admitting: Physical Therapy

## 2017-08-27 ENCOUNTER — Ambulatory Visit: Payer: PRIVATE HEALTH INSURANCE | Admitting: Physical Therapy

## 2017-08-27 DIAGNOSIS — M25612 Stiffness of left shoulder, not elsewhere classified: Secondary | ICD-10-CM

## 2017-08-27 DIAGNOSIS — Z483 Aftercare following surgery for neoplasm: Secondary | ICD-10-CM

## 2017-08-27 DIAGNOSIS — M25611 Stiffness of right shoulder, not elsewhere classified: Secondary | ICD-10-CM

## 2017-08-27 NOTE — Progress Notes (Addendum)
[place]  [phone] [phone]  Clinic Follow Up Note   Patient Care Team: [name], MD as PCP - General (Family Medicine) [name] (Family Medicine) [name], MD as Consulting Physician (General Surgery) [name], MD as Consulting Physician (Hematology) Kyung Rudd, MD as Consulting Physician (Radiation Oncology) 08/28/2017  CHIEF COMPLAINTS:  Follow up right breast cancer    Oncology History    Cancer Staging Malignant neoplasm of upper-outer quadrant of right breast in female, estrogen receptor positive ([place]) Staging form: Breast, AJCC 8th Edition - Clinical stage from 06/11/2017: Stage IB (cT3, cN0, cM0, G2, ER: Positive, PR: Positive, HER2: Positive) - Signed by [name], MD on 06/16/2017 - Pathologic stage from 07/22/2017: Stage IB (pT3(m), pN2a(sn), cM0, G2, ER: Positive, PR: Positive, HER2: Positive) - Signed by [name], NP on 08/04/2017      Malignant neoplasm of upper-outer quadrant of right breast in female, estrogen receptor positive ([place])   06/11/2017 Initial Biopsy    Diagnosis 06/11/17 Breast, right, needle core biopsy, 11:00 o'clock - INVASIVE DUCTAL CARCINOMA, G2      06/11/2017 Mammogram    [name] and [facility] Diagnostic Breast Tomo Bilateral 06/11/17 IMPRESSION: 1. Highly suspicious mass within the retroareolar right breast, extending from the 8:00 to 11:00 axes, anterior to posterior depth, extending anteriorly to the nipple with associated nipple retraction, measuring at least 5.5 cm by ultrasound  2. No sonographic evidence of metastatic lymphadenopathy in the right axilla. 3. No evidence of malignancy within the left breast.       06/11/2017 Initial Diagnosis    Malignant neoplasm of upper-outer quadrant of right breast in female, estrogen receptor positive ([place])      06/11/2017 Receptors her2    Estrogen Receptor: 100%, POSITIVE, STRONG STAINING INTENSITY Progesterone Receptor: 100%,  POSITIVE, STRONG STAINING INTENSITY Proliferation Marker Ki67: 12%Proliferation Marker Ki67: 12%  HER2 - **POSITIVE** RATIO OF HER2/CEP17 SIGNALS 2.55 AVERAGE HER2 COPY NUMBER PER CELL 4.85      06/25/2017 Genetic Testing    Negative genetic testing on the 9 gene STAT panel.  The STAT Breast cancer panel offered by [facility] includes sequencing and rearrangement analysis for the following 9 genes:  ATM, BRCA1, BRCA2, CDH1, CHEK2, PALB2, PTEN, STK11 and TP53.   The report date is June 25, 2017.  Negative genetic testing on the common hereditary cancer panel.  The Hereditary Gene Panel offered by [facility] includes sequencing and/or deletion duplication testing of the following 46 genes: APC, ATM, AXIN2, BARD1, BMPR1A, BRCA1, BRCA2, BRIP1, CDH1, CDKN2A (p14ARF), CDKN2A (p16INK4a), CHEK2, CTNNA1, DICER1, EPCAM (Deletion/duplication testing only), GREM1 (promoter region deletion/duplication testing only), KIT, MEN1, MLH1, MSH2, MSH3, MSH6, MUTYH, NBN, NF1, NHTL1, PALB2, PDGFRA, PMS2, POLD1, POLE, PTEN, RAD50, RAD51C, RAD51D, SDHB, SDHC, SDHD, SMAD4, SMARCA4. STK11, TP53, TSC1, TSC2, and VHL.  The following genes were evaluated for sequence changes only: SDHA and HOXB13 c.251G>A variant only.  The report date is June 25, 2017.       07/02/2017 Imaging    CT cAP 07/02/17 IMPRESSION: 1. Subareolar right breast mass. No compelling findings of nodal or metastatic involvement. 2. Old granulomatous disease. 3. There is a 7 mm enhancing or hyperdense focus posteriorly in segment 7 of the liver in the subcapsular region. By virtue of its small size this lesion is technically nonspecific although statistically likely to be a small benign lesions such as flash filling hemangioma. This may warrant surveillance. 4.  Prominent stool throughout the colon favors constipation. 5. 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      07/02/2017 Imaging    Bone scan  07/02/17 IMPRESSION: Today' s exam is negative. I ascribed the tiny focus of activity just proximal to the left antecubital region to injection site.      07/22/2017 Surgery    RIGHT TOTAL MASTECTOMY WITH RIGHT AXILLARY SENTINEL LYMPH NODE BIOPSY, LEFT PROPHYLACTIC MASTECTOM and INSERTION PORT-A-CATH WITH [name] by [name] and [facility]       07/22/2017 Pathology Results    Diagnosis 1. Breast, simple mastectomy, Left - FIBROADENOMA. - NO MALIGNANCY IDENTIFIED. 2. 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 - ADDITIONAL FOCUS OF LOBULAR CARCINOMA, GRADE 2, SPANNING 2.2 CM. - INTERMEDIATE GRADE DUCTAL CARCINOMA IN SITU. - INVASIVE CARCINOMA COMES TO WITHIN 0.2 TO 0.3 CM OF THE DEEP MARGIN, FOCALLY. - TUMOR FOCALLY INVOLVES EPIDERMIS. - LYMPHOVASCULAR INVASION OF DERMAL LYMPHATICS. - ONE OF ONE LYMPH NODES NEGATIVE FOR CARCINOMA (0/1). - SEE ONCOLOGY TABLE. 3. Lymph node, sentinel, biopsy, Right axillary - ONE LYMPH NODE WITH ISOLATED TUMOR CELLS (0/1). 4. Lymph node, sentinel, biopsy, Right - METASTATIC CARCINOMA IN ONE OF ONE LYMPH NODES (1/1). 5. Lymph node, sentinel, biopsy, Right - ONE OF ONE LYMPH NODES NEGATIVE FOR CARCINOMA (0/1). 6. Lymph node, sentinel, biopsy, Right - METASTATIC CARCINOMA IN ONE OF ONE LYMPH NODES (1/1). - EXTRACAPSULAR EXTENSION. 7. Lymph node, sentinel, biopsy, Right - METASTATIC CARCINOMA IN ONE OF ONE LYMPH NODES (1/1). - EXTRACAPSULAR EXTENSION. 8. Lymph node, sentinel, biopsy, Right - ONE OF ONE LYMPH NODES NEGATIVE FOR CARCINOMA (0/1). 9. Lymph node, sentinel, biopsy, Right - ONE OF ONE LYMPH NODES NEGATIVE FOR CARCINOMA (0/1). 10. Lymph node, sentinel, biopsy, Right - METASTATIC CARCINOMA IN ONE OF ONE LYMPH NODES (1/1). - EXTRACAPSULAR EXTENSION. 11. Lymph node, biopsy, Right axillary - ONE OF ONE LYMPH NODES NEGATIVE FOR CARCINOMA (0/1).        Chemotherapy    PENDING  adjuvant TCHP every 3 weeks for 6 cycles starting 08/28/17, followed by maintenance Herceptin with or without Perjeta for 6-12 months        08/25/2017 Imaging    MRI abdomen done at [facility]:  Enhancing structure measing 5 mm is compatible with a flash filling hemangioma. No specific findings to suggest metastatic cancer      HISTORY OF PRESENTING ILLNESS: [birth year] Suan Halter 41 y.o. female is here because of newly diagnosed Malignant neoplasm of upper-outer quadrant of right breast. She presents to the breast clinic today with her common law husband. She felt the lump initially when she had her physical 2 weeks ago, she had felt her nipples retracted several years ago. A few months ago it has become more pronounced. She had no pain or discharge.   In the past was diagnosed with Vitamin D deficiency and high cholesterol. She previously was on mood stabilizing medication. She feel right now she does not need that medication but is overwhelmed. She donated eggs and previously had kidney stones removed. Her mother had breast cancer and was diagnosed at 37 along with paternal grandmother in her 57s.   Today she has heart burn which she is taking Prilosec and will see her GI later today. She has trouble sleeping and uses tylenol pm. She tried [place] which did not help, same with melatonin. Her common law husband Is on disability.  GYN HISTORY  Menarchal: 19 LMP: July 23rd, 2018 Contraceptive: 20-29 on birth  control pill HRT: NA G0P0: does not want kids  CURRENT THERAPY: PENDING adjuvant chemotherapy TCHP every 3 weeks for 6 cycles beginning 08/28/2017  INTERVAL HISTORY:  Nikka Hakimian is here for a follow up and first cycle TCHP. She presents to the clinic today accompanied by her spouse. She continues to recover well from bilateral mastectomies. She has been approved to attend lymphedema clinic, has been going for 2 weeks and has 2 remaining weeks. She feels this is helping reduce  swelling and improve range of motion after surgery. She reports minimal pain. She attended a chemotherapy class and feels well prepared to begin chemotherapy tomorrow. Her PAC is accessed during today's visit, no complaints. She had an abdominal [facility] this week. She has had 2 episodes of diarrhea in the past 24 hours, she attributes to "nerves" or possibly the alkaline water with electrolytes she drank yesterday.   MEDICAL HISTORY:  Past Medical History:  Diagnosis Date  . Anxiety   . Cancer [facility])    right breast   . Family history of breast cancer   . Family history of colon cancer   . GERD (gastroesophageal reflux disease)   . Headache    couple of migraines in the past  . History of kidney stones   . Vitamin D deficiency    SURGICAL HISTORY: Past Surgical History:  Procedure Laterality Date  . egg donation    . kidney stone removal    . MASTECTOMY W/ SENTINEL NODE BIOPSY Bilateral 07/22/2017   Procedure: RIGHT TOTAL MASTECTOMY WITH RIGHT AXILLARY SENTINEL LYMPH NODE BIOPSY, LEFT PROPHYLACTIC MASTECTOMY;  Surgeon: [name], MD;  Location: [place];  Service: General;  Laterality: Bilateral;  . PORTACATH PLACEMENT Right 07/22/2017   Procedure: INSERTION PORT-A-CATH WITH [name];  Surgeon: [name], MD;  Location: [place];  Service: General;  Laterality: Right;   SOCIAL HISTORY: Social History   Social History  . Marital status: Single    Spouse name: N/A  . Number of children: N/A  . Years of education: N/A   Occupational History  . Not on file.   Social History Main Topics  . Smoking status: Never Smoker  . Smokeless tobacco: Former Systems developer  . Alcohol use Yes     Comment: once a year  . Drug use: No  . Sexual activity: Yes   Other Topics Concern  . Not on file   Social History Narrative  . No narrative on file   FAMILY HISTORY: Family History  Problem Relation Age of Onset  . Breast cancer Paternal Grandmother 48  . Colon cancer Paternal Grandmother  79  . Breast cancer Mother 82  . Prostate cancer Paternal Grandfather        dx in his 74s  . Leukemia Paternal Grandfather   . Lung cancer Maternal Uncle        heavy smoker  . Other Paternal Uncle        farm accident  . Stroke Maternal Grandfather    ALLERGIES:  has No Known Allergies.  MEDICATIONS:  Current Outpatient Prescriptions  Medication Sig Dispense Refill  . calcium carbonate (TUMS - DOSED IN MG ELEMENTAL CALCIUM) 500 MG chewable tablet Chew 2 tablets by mouth as needed for indigestion or heartburn.     . Cholecalciferol (VITAMIN D) 2000 units tablet Take 2,000 Units by mouth daily.    Marland Kitchen dexamethasone (DECADRON) 4 MG tablet Take 2 tablets (8 mg total) by mouth 2 (two) times daily. Start the day  before Taxotere. Then again the day after chemo for 3 days. 30 tablet 1  . diphenhydramine-acetaminophen (TYLENOL PM) 25-500 MG TABS tablet Take 2 tablets by mouth at bedtime. sleep    . fluticasone (FLONASE) 50 MCG/ACT nasal spray Place 1 spray into both nostrils daily.    Marland Kitchen lidocaine-prilocaine (EMLA) cream Apply to affected area once 30 g 3  . naproxen sodium (ANAPROX) 220 MG tablet Take 220 mg by mouth 2 (two) times daily with a meal.    . omeprazole (PRILOSEC) 20 MG capsule Take 20 mg by mouth at bedtime.     . ondansetron (ZOFRAN) 8 MG tablet Take 1 tablet (8 mg total) by mouth 2 (two) times daily as needed for refractory nausea / vomiting. Start on day 3 after chemo. 30 tablet 1  . prochlorperazine (COMPAZINE) 10 MG tablet Take 1 tablet (10 mg total) by mouth every 6 (six) hours as needed (Nausea or vomiting). 30 tablet 1   No current facility-administered medications for this visit.    REVIEW OF SYSTEMS:  Constitutional: Denies fevers, chills or abnormal night sweats (+) trouble sleeping (+) fatigue Eyes: Denies blurriness of vision, double vision or watery eyes Ears, nose, mouth, throat, and face: Denies mucositis or sore throat Respiratory: Denies cough, dyspnea or  wheezes Cardiovascular: Denies palpitation, chest discomfort or lower extremity swelling Gastrointestinal:  Denies nausea, vomiting, constipation (+) heart burn (+) diarrhea x2 in 24 hours  Skin: Denies abnormal skin rashes Lymphatics: Denies new lymphadenopathy or easy bruising Neurological:Denies numbness, tingling or new weaknesses Behavioral/Psych: Mood is stable, no new changes  Breast: (+) bilateral mastectomy on 07/22/17 All other systems were reviewed with the patient and are negative.  PHYSICAL EXAMINATION: ECOG PERFORMANCE STATUS: 0  Vitals:   08/28/17 1347  BP: (!) 104/57  Pulse: (!) 117  Resp: 17  Temp: 98.9 F (37.2 C)  SpO2: 99%   Filed Weights   08/28/17 1347  Weight: 157 lb 11.2 oz (71.5 kg)     GENERAL:alert, no distress and comfortable SKIN: skin color, texture, turgor are normal, no rashes or significant lesions EYES: normal, conjunctiva are pink and non-injected, sclera clear OROPHARYNX:no exudate, no erythema and lips, buccal mucosa, and tongue normal  NECK: supple, thyroid normal size, non-tender, without nodularity LYMPH:  no palpable cervical or supraclavicular lymphadenopathy LUNGS: clear to auscultation bilaterally with normal breathing effort HEART: regular rate & rhythm and no murmurs and no lower extremity edema ABDOMEN:abdomen soft, non-tender and normal bowel sounds Musculoskeletal:no cyanosis of digits and no clubbing  PSYCH: alert & oriented x 3 with fluent speech NEURO: no focal motor/sensory deficits Breast: (+)bilateral mastectomy, incisions clean, intact, and well healed    LABORATORY DATA:  I have reviewed the data as listed CBC Latest Ref Rng & Units 08/28/2017 07/15/2017 06/18/2017  WBC 3.9 - 10.3 10e3/uL 8.5 4.9 3.6(L)  Hemoglobin 11.6 - 15.9 g/dL 12.1 12.8 13.3  Hematocrit 34.8 - 46.6 % 36.7 39.2 40.9  Platelets 145 - 400 10e3/uL 197 194 187   CMP Latest Ref Rng & Units 08/28/2017 07/15/2017 06/18/2017  Glucose 70 - 140 mg/dl 151(H)  91 97  BUN 7.0 - 26.0 mg/dL 12.8 12 14.6  Creatinine 0.6 - 1.1 mg/dL 0.9 0.95 1.1  Sodium 136 - 145 mEq/L 137 139 140  Potassium 3.5 - 5.1 mEq/L 4.0 4.6 4.5  Chloride 101 - 111 mmol/L - 106 -  CO2 22 - 29 mEq/L 20(L) 25 26  Calcium 8.4 - 10.4 mg/dL 9.3 9.6 9.4  Total  Protein 6.4 - 8.3 g/dL 7.2 - 7.2  Total Bilirubin 0.20 - 1.20 mg/dL 0.37 - 0.46  Alkaline Phos 40 - 150 U/L 64 - 59  AST 5 - 34 U/L 12 - 11  ALT 0 - 55 U/L 8 - 7   PATHOLOGY  Diagnosis 07/22/17 1. Breast, simple mastectomy, Left - FIBROADENOMA. - NO MALIGNANCY IDENTIFIED. 2. 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 - ADDITIONAL FOCUS OF LOBULAR CARCINOMA, GRADE 2, SPANNING 2.2 CM. - INTERMEDIATE GRADE DUCTAL CARCINOMA IN SITU. - INVASIVE CARCINOMA COMES TO WITHIN 0.2 TO 0.3 CM OF THE DEEP MARGIN, FOCALLY. - TUMOR FOCALLY INVOLVES EPIDERMIS. - LYMPHOVASCULAR INVASION OF DERMAL LYMPHATICS. - ONE OF ONE LYMPH NODES NEGATIVE FOR CARCINOMA (0/1). - SEE ONCOLOGY TABLE. 3. Lymph node, sentinel, biopsy, Right axillary - ONE LYMPH NODE WITH ISOLATED TUMOR CELLS (0/1). 4. Lymph node, sentinel, biopsy, Right - METASTATIC CARCINOMA IN ONE OF ONE LYMPH NODES (1/1). 5. Lymph node, sentinel, biopsy, Right - ONE OF ONE LYMPH NODES NEGATIVE FOR CARCINOMA (0/1). 6. Lymph node, sentinel, biopsy, Right - METASTATIC CARCINOMA IN ONE OF ONE LYMPH NODES (1/1). - EXTRACAPSULAR EXTENSION. 7. Lymph node, sentinel, biopsy, Right - METASTATIC CARCINOMA IN ONE OF ONE LYMPH NODES (1/1). - EXTRACAPSULAR EXTENSION. 8. Lymph node, sentinel, biopsy, Right - ONE OF ONE LYMPH NODES NEGATIVE FOR CARCINOMA (0/1). 9. Lymph node, sentinel, biopsy, Right - ONE OF ONE LYMPH NODES NEGATIVE FOR CARCINOMA (0/1). 10. Lymph node, sentinel, biopsy, Right - METASTATIC CARCINOMA IN ONE OF ONE LYMPH NODES (1/1). - EXTRACAPSULAR EXTENSION. 11. Lymph node, biopsy, Right axillary - ONE OF ONE  LYMPH NODES NEGATIVE FOR CARCINOMA (0/1). Microscopic Comment 2. BREAST, INVASIVE TUMOR Microscopic Comment(continued) Procedure: Bilateral simple mastectomies with right axillary sentinel lymph node biopsies. Laterality: Right. Tumor Size: 5.3 cm. Histologic Type: Mixed lobular and ductal carcinoma. Grade: 2 Tubular Differentiation: 2 Nuclear Pleomorphism: 3 Mitotic Count: 1 Ductal Carcinoma in Situ (DCIS): Present, intermediate grade. Extent of Tumor: Involves epidermis of nipple focally. Margins: Invasive carcinoma, distance from closest margin: 0.2-0.3 cm of posterior margin (smaller mass). DCIS, distance from closest margin: >0.5 cm all margins Regional Lymph Nodes: Number of Lymph Nodes Examined: 10 Number of Sentinel Lymph Nodes Examined: 9 Lymph Nodes with Macrometastases: 4 Lymph Nodes with Micrometastases: 0 Lymph Nodes with Isolated Tumor Cells: 1 Breast Prognostic Profile: Performed on biopsy ([id number]), see below. Will be performed on additional lobular focus. Estrogen Receptor: Positive, 100% strong staining. Progesterone Receptor: Positive, 100% strong staining. Her2: Positive (ratio 2.55). Ki-67: 12%. Best tumor block for sendout testing: 2B (larger focus), 2E (smaller focus). Pathologic Stage Classification (pTNM, AJCC 8th Edition): Primary Tumor (pT): mpT3 Regional Lymph Nodes (pN): pN2a Distant Metastases (pM): pMX 2. FLUORESCENCE IN-SITU HYBRIDIZATION Results: HER2 - NEGATIVE RATIO OF HER2/CEP17 SIGNALS 1.71 AVERAGE HER2 COPY NUMBER PER CELL 1.80 2. PROGNOSTIC INDICATORS Results: IMMUNOHISTOCHEMICAL AND MORPHOMETRIC ANALYSIS PERFORMED MANUALLY Estrogen Receptor: 95%, POSITIVE, STRONG STAINING INTENSITY Progesterone Receptor: 95%, POSITIVE, STRONG STAINING INTENSITY Proliferation Marker Ki67: 10%  Diagnosis 06/11/17 Breast, right, needle core biopsy, 11:00 o'clock - INVASIVE DUCTAL CARCINOMA, SEE COMMENT. Microscopic Comment The carcinoma appears  grade 2. Prognostic markers will be ordered. Dr. Lyndon Code has reviewed the case. The case was called to [facility] on 06/12/2017. Results: HER2 - **POSITIVE** RATIO OF HER2/CEP17 SIGNALS 2.55 AVERAGE HER2 COPY NUMBER PER CELL 4.85  PROCEDURES  ECHO 08/12/17 Study Conclusions - Left ventricle: The cavity size was normal. Wall  thickness was   normal. Systolic function was normal. The estimated ejection   fraction was in the range of 60% to 65%. Wall motion was normal;   there were no regional wall motion abnormalities. GLS -20.2%.   Left ventricular diastolic function parameters were normal. - Aortic valve: There was no stenosis. - Mitral valve: There was no regurgitation. - Right ventricle: The cavity size was normal. Systolic function   was normal. - Pulmonary arteries: No complete TR doppler jet so unable to   estimate PA systolic pressure. - Inferior vena cava: The vessel was normal in size. The   respirophasic diameter changes were in the normal range (>= 50%),   consistent with normal central venous pressure. Impressions: - Normal LV size with EF 60-65%. Normal diastolic function. Strain   as noted above. Normal RV size and systolic function. No   significant valvular abnormalities.   RADIOGRAPHIC STUDIES: I have personally reviewed the radiological images as listed and agreed with the findings in the report. No results found. ASSESSMENT & PLAN:  Meg Niemeier is a 41 y.o. premenopausal female with a history of Vitamin D deficiency and high cholesterol and kidney stones, presented with a palpable right breast mass.   1. Malignant neoplasm of upper-outer quadrant of right breast in female, mixed invasive ductal and lobular carcinoma, pT3(m)N2aM0, stage 1b, ductal carcinoma tripple positive, lobular carcinoma ER+/PR+/HER2-,  Grade 2 -She underwent staging CT and bone scan on 07/02/2017 prior to surgery, both were negative for metastasis. -There was a 67m  enhancing or hyperdense focus posteriorly in the liver which is technically nonspecific, [email or web address] a flash filling hemangioma. I reviewed these results with the patient and family today -surgical pathology  result has been previously reviewed with the patient and her husband in detail.  -Final diagnosis from the right simple mastectomy and sentinel lymph node biopsy on 07/22/2017 revealed mixed invasive lobular and ductal carcinoma, grade 2, spanning 5.3 cm. There is an additional focus of lobular carcinoma, grade 2, spanning 2.2cm and it is ER+/PR+/HER2-. 4 of 10 lymph nodes tested positive for macrometastases. The initial biopsy was mainly ductal with ER, PR, and HER2 positive.  further pathology review of the right sentinel lymph node reveals metastatic carcinoma that is HER-2 positive.   -There is a potential for oral anti-HER2 therapy after she completes Herceptin maintenance therapy. -Given her locally advanced disease in HER2 positive node, Dr. FBurr Medicorecommended a treatment plan to include adjuvant docetaxel, carboplatin, Herceptin and pejeta (TCHP) every 3 weeks for 6 cycles, followed by maintenance Herceptin with  Perjeta for 12 months -The goal of therapy is curative - she is recommended to receive adjuvant antiestrogen therapy with tamoxifen given her ER/PR positive tumor,  starting after her adjuvant radiation.  -Her insurance will not approve Neulasta, she will receive granix x3 after chemo; we reviewed the possible side effect of bone pain and I instructed her to take Claritin 5 days after her injections -PAC placed during surgery, Chest xray confirms tip placement in the SVC. -Baseline echo was normal, she saw Dr. MAundra Dubinon 08/12/2017 -she attended chemo class; we again reviewed anti-emetic regimen and how to manage diarrhea -She is on Prilosec for heartburn, we discussed possible need to increase Prilosec while receiving chemo and  steroids -Labs reviewed, appropriate for treatment; exam is unremarkable, she is recovering well from surgery. She'll proceed with cycle 1 TCHP on 08/29/2017 -she will return 10/16, 10/17, and 10/18 for  granix -Lab, flush, f/u with APP on 10/18  2. Genetics -Due to her family history of breast cancer, she agreed to a genetics referral -She was negative for all mutations tested   3. Hyperglycemia -she has no personal history of diabetes -we discussed BG can increase during chemotherapy especially in the setting of steroid premeds -she can monitor at home with her husband's glucometer, she will notify us if consistently elevated or over 140 postprandial -BG 151 today  4. Social work, Insurance underwriter -she has insurance that only approved herceptin and perjeta for 5 treatments; we will need to extend this request as she will need therapy for total of 12 months -she is requesting a letter for her insurance company indicating why she needs echocardiograms and why they need to be performed by our physicians; I will provide that for her.   PLAN:  -Flu shot today -lab reviewed, exam suggests she has recovered well from surgery; she will proceed with cycle 1 TCHP 08/29/17 -return for granix on 10/16, 10/17, and 10/18 -claritin for bone pain; prilosec for heartburn -Lab, flush, f/u with APP on 10/18 -cycle 2 in 3 weeks    All questions were answered. The patient knows to call the clinic with any problems, questions or concerns. I spent 30 minutes counseling the patient face to face. The total time spent in the appointment was 40 minutes and more than 50% was on counseling.  Cira Rue, AGNP-C 08/28/2017  I have seen the patient, examined her. I agree with the assessment and and plan and have edited the notes.    [name]  08/28/2017

## 2017-08-27 NOTE — Therapy (Signed)
[place], [place], [address] Phone: [phone]   Fax:  [phone]  Physical Therapy Treatment  Patient Details  Name: Sheila [name] MRN: [id number] Date of Birth: April 29, 1976 Referring Provider: Dr. [name]  Encounter Date: 08/27/2017      PT End of Session - 08/27/17 1652    Visit Number 5   Number of Visits 9   Date for PT Re-Evaluation 09/05/17   PT Start Time 1601   PT Stop Time 1646   PT Time Calculation (min) 45 min   Activity Tolerance Patient tolerated treatment well   Behavior During Therapy [facility] for tasks assessed/performed      Past Medical History:  Diagnosis Date  . Anxiety   . Cancer [facility])    right breast   . Family history of breast cancer   . Family history of colon cancer   . GERD (gastroesophageal reflux disease)   . Headache    couple of migraines in the past  . History of kidney stones   . Vitamin D deficiency     Past Surgical History:  Procedure Laterality Date  . egg donation    . kidney stone removal    . MASTECTOMY W/ SENTINEL NODE BIOPSY Bilateral 07/22/2017   Procedure: RIGHT TOTAL MASTECTOMY WITH RIGHT AXILLARY SENTINEL LYMPH NODE BIOPSY, LEFT PROPHYLACTIC MASTECTOMY;  Surgeon: [name], MD;  Location: [place];  Service: General;  Laterality: Bilateral;  . PORTACATH PLACEMENT Right 07/22/2017   Procedure: INSERTION PORT-A-CATH WITH [name];  Surgeon: [name], MD;  Location: [place];  Service: General;  Laterality: Right;    There were no vitals filed for this visit.      Subjective Assessment - 08/27/17 1603    Subjective My shoulder was really sore after last time. I couldn't use the stick shift. [name] said if the swelling doesn't go down he may need to drain it.    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 It measures 5.5 cm and is located  in the upper outer quadrant with a Ki67 of 12%. , Bilateral mastectomies on 07/22/17 with SLNB on R with 2 to 3 additional nodes taken, pt has to complete chemo and radiation and will begin chemo on Aug 29, 2017   Patient Stated Goals to return to prior level of function, be able to sleep with no discomfort, get ROM back to where it was   Currently in Pain? No/denies   Pain Score 0-No pain            [facility] PT Assessment - 08/27/17 0001      AROM   Right Shoulder Flexion 122 Degrees   Right Shoulder ABduction 106 Degrees  after stretching   Left Shoulder Flexion 148 Degrees   Left Shoulder ABduction 130 Degrees  with pulling in chest                     [facility] Adult PT Treatment/Exercise - 08/27/17 0001      Shoulder Exercises: Standing   Flexion AAROM;Both;10 reps  on washcloth   ABduction AAROM;Both;10 reps  with washcloth up wall   Other Standing Exercises windshield wipers x 10 bilaterally with washcloth on wall     Shoulder Exercises: Pulleys   Flexion 2 minutes  with stretch at end range   ABduction 2 minutes  with stretch at end range  Shoulder Exercises: Therapy Ball   Flexion 10 reps   ABduction 10 reps  on left and right     Manual Therapy   Passive ROM within pain limits for right and left shoulder in flexion, abduction, ER, and IR                      Breast Clinic Goals - 06/18/17 1221      Patient will be able to verbalize understanding of pertinent lymphedema risk reduction practices relevant to her diagnosis specifically related to skin care.   Time 1   Period Days   Status Achieved     Patient will be able to return demonstrate and/or verbalize understanding of the post-op home exercise program related to regaining shoulder range of motion.   Time 1   Period Days   Status Achieved     Patient will be able to verbalize understanding of the importance of attending the postoperative After Breast Cancer Class for further  lymphedema risk reduction education and therapeutic exercise.   Time 1   Period Days   Status Achieved          Long Term Clinic Goals - 08/25/17 0901      CC Long Term Goal  #1   Title Pt to demonstrate 165 degrees of bilateral shoulder flexion to allow her to reach up and overhead   Baseline R 98, L 125, 08/25/17- R 122, L 141   Time 4   Period Weeks   Status On-going     CC Long Term Goal  #2   Title Pt to demonstrate 165 degrees of bilateral shoulder abduction to allow pt to reach out to sides   Baseline R 74, L 86, 08/25/17- R 92, L 141   Time 4   Period Weeks   Status On-going     CC Long Term Goal  #3   Title Pt to demonstrate 70 degrees of right shoulder internal rotation to allow her to return to prior level of function   Baseline 48, 08/25/17- 51   Time 4   Period Weeks   Status On-going     CC Long Term Goal  #4   Title Pt to report a 75% improvement in right trunk edema to allow improved comfort and sleep   Baseline 08/25/17- 60%    Time 4   Period Weeks   Status On-going     CC Long Term Goal  #5   Title Pt to be independent in a home exercise program for continued strengthening and stretching   Time 4   Period Weeks   Status On-going            Plan - 08/27/17 1652    Clinical Impression Statement Continued with AAROM exercises today. Instructed pt to use washcloth at home for [facility] exercises on wall including bilateral shoulder flexion, abduction and windshield wiper. Pt's ROM continues to improve on R.    Rehab Potential Good   Clinical Impairments Affecting Rehab Potential pt to begin chemo Oct 12 and will have it every 3 weeks    PT Frequency 2x / week   PT Duration 4 weeks   PT Treatment/Interventions ADLs/Self Care Home Management;Therapeutic activities;Therapeutic exercise;Orthotic Fit/Training;Patient/family education;Manual techniques;Manual lymph drainage;Scar mobilization;Passive range of motion;Taping   PT Next Visit Plan continue  pulleys and ball, do PROM to bilat shoulders,   add active exercsie as able.  later teach Strength ABC program.    PT Home  Exercise Plan Post op shoulder ROM HEP advised pt to not lift arms above 90 degrees until a week after drains removed, supine dowel   Consulted and Agree with Plan of Care Patient      Patient will benefit from skilled therapeutic intervention in order to improve the following deficits and impairments:  Postural dysfunction, Decreased knowledge of precautions, Pain, Impaired UE functional use, Decreased range of motion, Decreased strength, Decreased scar mobility, Increased fascial restricitons  Visit Diagnosis: Stiffness of left shoulder, not elsewhere classified  Stiffness of right shoulder, not elsewhere classified  Aftercare following surgery for neoplasm     Problem List Patient Active Problem List   Diagnosis Date Noted  . Genetic testing 06/25/2017  . Family history of breast cancer   . Family history of colon cancer   . Malignant neoplasm of upper-outer quadrant of right breast in female, estrogen receptor positive ([place]) 06/16/2017    Allyson Sabal [facility] 08/27/2017, 4:55 PM  [place], [place], [address] Phone: [phone]   Fax:  [phone]  Name: Persais Ethridge MRN: [id number] Date of Birth: [birth year]  Manus Gunning, PT 08/27/17 4:55 PM

## 2017-08-28 ENCOUNTER — Other Ambulatory Visit: Payer: Self-pay | Admitting: Hematology

## 2017-08-28 ENCOUNTER — Ambulatory Visit (HOSPITAL_BASED_OUTPATIENT_CLINIC_OR_DEPARTMENT_OTHER): Payer: PRIVATE HEALTH INSURANCE | Admitting: Hematology

## 2017-08-28 ENCOUNTER — Ambulatory Visit: Payer: PRIVATE HEALTH INSURANCE

## 2017-08-28 ENCOUNTER — Telehealth: Payer: Self-pay | Admitting: Hematology

## 2017-08-28 ENCOUNTER — Encounter: Payer: Self-pay | Admitting: Hematology

## 2017-08-28 ENCOUNTER — Other Ambulatory Visit (HOSPITAL_BASED_OUTPATIENT_CLINIC_OR_DEPARTMENT_OTHER): Payer: PRIVATE HEALTH INSURANCE

## 2017-08-28 VITALS — BP 104/57 | HR 117 | Temp 98.9°F | Resp 17 | Ht 66.0 in | Wt 157.7 lb

## 2017-08-28 DIAGNOSIS — Z23 Encounter for immunization: Secondary | ICD-10-CM

## 2017-08-28 DIAGNOSIS — Z170 Estrogen receptor positive status [ER+]: Secondary | ICD-10-CM

## 2017-08-28 DIAGNOSIS — E0965 Drug or chemical induced diabetes mellitus with hyperglycemia: Secondary | ICD-10-CM

## 2017-08-28 DIAGNOSIS — C50411 Malignant neoplasm of upper-outer quadrant of right female breast: Secondary | ICD-10-CM

## 2017-08-28 DIAGNOSIS — E559 Vitamin D deficiency, unspecified: Secondary | ICD-10-CM | POA: Diagnosis not present

## 2017-08-28 DIAGNOSIS — Z95828 Presence of other vascular implants and grafts: Secondary | ICD-10-CM | POA: Insufficient documentation

## 2017-08-28 LAB — CBC WITH DIFFERENTIAL/PLATELET
BASO%: 0.4 % (ref 0.0–2.0)
Basophils Absolute: 0 10*3/uL (ref 0.0–0.1)
EOS ABS: 0 10*3/uL (ref 0.0–0.5)
EOS%: 0 % (ref 0.0–7.0)
HCT: 36.7 % (ref 34.8–46.6)
HEMOGLOBIN: 12.1 g/dL (ref 11.6–15.9)
LYMPH%: 5.4 % — AB (ref 14.0–49.7)
MCH: 30.7 pg (ref 25.1–34.0)
MCHC: 33 g/dL (ref 31.5–36.0)
MCV: 93 fL (ref 79.5–101.0)
MONO#: 0 10*3/uL — AB (ref 0.1–0.9)
MONO%: 0.5 % (ref 0.0–14.0)
NEUT%: 93.7 % — ABNORMAL HIGH (ref 38.4–76.8)
NEUTROS ABS: 7.9 10*3/uL — AB (ref 1.5–6.5)
Platelets: 197 10*3/uL (ref 145–400)
RBC: 3.94 10*6/uL (ref 3.70–5.45)
RDW: 12.9 % (ref 11.2–14.5)
WBC: 8.5 10*3/uL (ref 3.9–10.3)
lymph#: 0.5 10*3/uL — ABNORMAL LOW (ref 0.9–3.3)

## 2017-08-28 LAB — COMPREHENSIVE METABOLIC PANEL
ALBUMIN: 4.2 g/dL (ref 3.5–5.0)
ALK PHOS: 64 U/L (ref 40–150)
ALT: 8 U/L (ref 0–55)
AST: 12 U/L (ref 5–34)
Anion Gap: 9 mEq/L (ref 3–11)
BILIRUBIN TOTAL: 0.37 mg/dL (ref 0.20–1.20)
BUN: 12.8 mg/dL (ref 7.0–26.0)
CO2: 20 mEq/L — ABNORMAL LOW (ref 22–29)
Calcium: 9.3 mg/dL (ref 8.4–10.4)
Chloride: 108 mEq/L (ref 98–109)
Creatinine: 0.9 mg/dL (ref 0.6–1.1)
GLUCOSE: 151 mg/dL — AB (ref 70–140)
Potassium: 4 mEq/L (ref 3.5–5.1)
SODIUM: 137 meq/L (ref 136–145)
TOTAL PROTEIN: 7.2 g/dL (ref 6.4–8.3)

## 2017-08-28 MED ORDER — SODIUM CHLORIDE 0.9% FLUSH
10.0000 mL | Freq: Once | INTRAVENOUS | Status: AC
  Administered 2017-08-28: 10 mL
  Filled 2017-08-28: qty 10

## 2017-08-28 MED ORDER — HEPARIN SOD (PORK) LOCK FLUSH 100 UNIT/ML IV SOLN
500.0000 [IU] | Freq: Once | INTRAVENOUS | Status: AC
  Administered 2017-08-28: 500 [IU]
  Filled 2017-08-28: qty 5

## 2017-08-28 MED ORDER — INFLUENZA VAC SPLIT QUAD 0.5 ML IM SUSY
0.5000 mL | PREFILLED_SYRINGE | Freq: Once | INTRAMUSCULAR | Status: AC
  Administered 2017-08-28: 0.5 mL via INTRAMUSCULAR
  Filled 2017-08-28: qty 0.5

## 2017-08-28 NOTE — Telephone Encounter (Signed)
Scheduled appt per 10/11 los - Gave patient AVS and calender per los.  

## 2017-08-28 NOTE — Progress Notes (Signed)
Patient came in to bring proof of income for [employer]. Patient approved for one-time $1000 [employer]. Patient has a copy of the expense sheet as well as the expenses it covers along with our outpatient pharmacy information.  Also advised patient of approvals and she will be receiving approval letters for each for Neulasta, Herceptin and Perjeta. She states her insurance denied Neulasta and approved Granix. Advised patient there are no foundations that have funds available that will assist with Granix copay. Patient has my card for any additional financial questions or concerns.

## 2017-08-29 ENCOUNTER — Other Ambulatory Visit: Payer: Self-pay | Admitting: Medical

## 2017-08-29 ENCOUNTER — Ambulatory Visit (HOSPITAL_BASED_OUTPATIENT_CLINIC_OR_DEPARTMENT_OTHER): Payer: PRIVATE HEALTH INSURANCE

## 2017-08-29 ENCOUNTER — Encounter: Payer: Self-pay | Admitting: *Deleted

## 2017-08-29 ENCOUNTER — Inpatient Hospital Stay (HOSPITAL_BASED_OUTPATIENT_CLINIC_OR_DEPARTMENT_OTHER): Payer: PRIVATE HEALTH INSURANCE | Admitting: Medical

## 2017-08-29 VITALS — BP 106/69 | HR 79 | Temp 97.6°F | Resp 18

## 2017-08-29 DIAGNOSIS — C50411 Malignant neoplasm of upper-outer quadrant of right female breast: Secondary | ICD-10-CM

## 2017-08-29 DIAGNOSIS — K219 Gastro-esophageal reflux disease without esophagitis: Secondary | ICD-10-CM

## 2017-08-29 DIAGNOSIS — Z5112 Encounter for antineoplastic immunotherapy: Secondary | ICD-10-CM | POA: Diagnosis not present

## 2017-08-29 DIAGNOSIS — Z5111 Encounter for antineoplastic chemotherapy: Secondary | ICD-10-CM | POA: Diagnosis not present

## 2017-08-29 DIAGNOSIS — Z170 Estrogen receptor positive status [ER+]: Principal | ICD-10-CM

## 2017-08-29 LAB — CANCER ANTIGEN 27.29: CAN 27.29: 7.9 U/mL (ref 0.0–38.6)

## 2017-08-29 MED ORDER — SODIUM CHLORIDE 0.9% FLUSH
10.0000 mL | INTRAVENOUS | Status: DC | PRN
  Filled 2017-08-29: qty 10

## 2017-08-29 MED ORDER — ACETAMINOPHEN 325 MG PO TABS
ORAL_TABLET | ORAL | Status: AC
  Filled 2017-08-29: qty 2

## 2017-08-29 MED ORDER — HEPARIN SOD (PORK) LOCK FLUSH 100 UNIT/ML IV SOLN
500.0000 [IU] | Freq: Once | INTRAVENOUS | Status: DC | PRN
  Filled 2017-08-29: qty 5

## 2017-08-29 MED ORDER — PALONOSETRON HCL INJECTION 0.25 MG/5ML
INTRAVENOUS | Status: AC
  Filled 2017-08-29: qty 5

## 2017-08-29 MED ORDER — SODIUM CHLORIDE 0.9 % IV SOLN: Freq: Once | INTRAVENOUS | Status: AC

## 2017-08-29 MED ORDER — DEXAMETHASONE SODIUM PHOSPHATE 10 MG/ML IJ SOLN
INTRAMUSCULAR | Status: AC
Start: 2017-08-29 — End: 2017-08-29
  Filled 2017-08-29: qty 1

## 2017-08-29 MED ORDER — TRASTUZUMAB CHEMO 150 MG IV SOLR
8.0000 mg/kg | Freq: Once | INTRAVENOUS | Status: AC
  Administered 2017-08-29: 567 mg via INTRAVENOUS
  Filled 2017-08-29: qty 27

## 2017-08-29 MED ORDER — SODIUM CHLORIDE 0.9 % IV SOLN
840.0000 mg | Freq: Once | INTRAVENOUS | Status: AC
  Administered 2017-08-29: 840 mg via INTRAVENOUS
  Filled 2017-08-29: qty 28

## 2017-08-29 MED ORDER — SODIUM CHLORIDE 0.9 % IV SOLN
713.4000 mg | Freq: Once | INTRAVENOUS | Status: AC
  Administered 2017-08-29: 710 mg via INTRAVENOUS
  Filled 2017-08-29: qty 71

## 2017-08-29 MED ORDER — PALONOSETRON HCL INJECTION 0.25 MG/5ML
0.2500 mg | Freq: Once | INTRAVENOUS | Status: AC
  Administered 2017-08-29: 0.25 mg via INTRAVENOUS

## 2017-08-29 MED ORDER — FAMOTIDINE IN NACL 20-0.9 MG/50ML-% IV SOLN
20.0000 mg | Freq: Two times a day (BID) | INTRAVENOUS | Status: DC
  Administered 2017-08-29: 20 mg via INTRAVENOUS

## 2017-08-29 MED ORDER — METHYLPREDNISOLONE SODIUM SUCC 125 MG IJ SOLR
125.0000 mg | Freq: Once | INTRAMUSCULAR | Status: AC
  Administered 2017-08-29: 125 mg via INTRAVENOUS

## 2017-08-29 MED ORDER — SODIUM CHLORIDE 0.9 % IV SOLN
Freq: Once | INTRAVENOUS | Status: AC
  Administered 2017-08-29: 08:00:00 via INTRAVENOUS

## 2017-08-29 MED ORDER — ACETAMINOPHEN 325 MG PO TABS
650.0000 mg | ORAL_TABLET | Freq: Once | ORAL | Status: AC
  Administered 2017-08-29: 650 mg via ORAL

## 2017-08-29 MED ORDER — DEXAMETHASONE SODIUM PHOSPHATE 10 MG/ML IJ SOLN
10.0000 mg | Freq: Once | INTRAMUSCULAR | Status: AC
  Administered 2017-08-29: 10 mg via INTRAVENOUS

## 2017-08-29 MED ORDER — DIPHENHYDRAMINE HCL 25 MG PO CAPS
ORAL_CAPSULE | ORAL | Status: AC
  Filled 2017-08-29: qty 2

## 2017-08-29 MED ORDER — GI COCKTAIL ~~LOC~~
30.0000 mL | Freq: Once | ORAL | Status: DC
  Filled 2017-08-29: qty 30

## 2017-08-29 MED ORDER — SODIUM CHLORIDE 0.9 % IV SOLN
75.0000 mg/m2 | Freq: Once | INTRAVENOUS | Status: AC
  Administered 2017-08-29: 140 mg via INTRAVENOUS
  Filled 2017-08-29: qty 14

## 2017-08-29 MED ORDER — DIPHENHYDRAMINE HCL 25 MG PO CAPS
50.0000 mg | ORAL_CAPSULE | Freq: Once | ORAL | Status: AC
  Administered 2017-08-29: 50 mg via ORAL

## 2017-08-29 NOTE — Progress Notes (Signed)
Symptoms Management Clinic Progress Note   Sheila [name] [id number] [birth year] 41 y.o.  Sheila [name] is managed by Dr. Burr Medico  Actively treated with chemotherapy: yes  Current Therapy: Carboplatin, docetaxel, trastuzumab and pertuzumab  Last Treated: 10 / 12 / 2018  Assessment: Plan:    Gastroesophageal reflux disease, esophagitis presence not specified: The patient had been given methylprednisolone 125 mg IV and Pepcid 20 mg IV. Despite this she continued to have a burning sensation in her abdomen and chest. She was given a GI cocktail after which time her symptoms resolved.  Please see After Visit Summary for patient specific instructions.  Future Appointments Date Time Provider [place]  08/29/2017 2:15 PM Sandi Mealy E., PA-C [facility] None  09/01/2017 3:15 PM Wynelle Beckmann, Blaire L, PT [facility] None  09/02/2017 4:00 PM [facility] FLUSH NURSE [facility] None  09/03/2017 3:15 PM Breedlove Blue, Blaire L, PT [facility] None  09/03/2017 4:15 PM [facility] FLUSH NURSE [facility] None  09/04/2017 1:15 PM [facility] ONLY [facility] None  09/04/2017 1:30 PM [facility] FLUSH NURSE [facility] None  09/04/2017 2:00 PM Cira Rue K, NP [facility] None  09/04/2017 2:45 PM [facility] INJ NURSE [facility] None  09/08/2017 3:15 PM Jomarie Longs, PT [facility] None  09/10/2017 3:15 PM Breedlove Oren Binet, Blaire L, PT [facility] None  09/19/2017 9:15 AM [facility] LAB 4 [facility] None  09/19/2017 9:30 AM [facility] FLUSH NURSE 2 [facility] None  09/19/2017 10:15 AM [name], MD [facility] None  09/19/2017 11:15 AM [facility] I25 DNS [facility] None  10/10/2017 9:45 AM [facility] LAB 5 [facility] None  10/10/2017 10:00 AM [facility] FLUSH NURSE 2 [facility] None  10/10/2017 10:30 AM [name], NP [facility] None  10/10/2017 11:30 AM [facility] I25 DNS [facility] None  11/17/2017 9:00 AM [facility] ECHO 1-BUZZ [facility]  11/17/2017 10:00 AM  Larey Dresser, MD [facility] None    No orders of the defined types were placed in this encounter.      Subjective:   Patient ID:  Sheila [name] is a 41 y.o. (DOB Mar 18, 1976) female.  Chief Complaint: No chief complaint on file.   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 The patient presents to the clinic today for cycle 1 day 1 of carboplatin, docetaxel, trastuzumab and pertuzumab. She had completed her infusion of trastuzumab and was up to the bathroom. She returned and was just starting her infusion of pertuzumab when she reported a sensation of warmth in her chest and burning in her stomach similar to GERD. The patient was given Pepcid 20 mg IV and methylprednisolone 125 mg IV. She continued to have some cramping in her stomach and burning. Her pertuzumab was restarted. The patient was given a GI cocktail. Her stomach burning resolved completely.  Medications: I have reviewed the patient's current medications.  Allergies: No Known Allergies  Past Medical History:  Diagnosis Date  . Anxiety   . Cancer [facility])    right breast   . Family history of breast cancer   . Family history of colon cancer   . GERD (gastroesophageal reflux disease)   . Headache    couple of migraines in the past  . History of kidney stones   . Vitamin D deficiency     Past Surgical History:  Procedure Laterality Date  . egg donation    . kidney stone removal    . MASTECTOMY W/ SENTINEL NODE BIOPSY Bilateral 07/22/2017   Procedure: RIGHT TOTAL MASTECTOMY WITH RIGHT AXILLARY SENTINEL LYMPH NODE BIOPSY, LEFT PROPHYLACTIC MASTECTOMY;  Surgeon: [name], MD;  Location: [place];  Service: General;  Laterality: Bilateral;  . PORTACATH  PLACEMENT Right 07/22/2017   Procedure: INSERTION PORT-A-CATH WITH [name];  Surgeon: [name], MD;  Location: [place];  Service: General;  Laterality: Right;    Family History  Problem Relation Age of Onset  . Breast cancer Paternal Grandmother 49  . Colon cancer Paternal Grandmother 1  . Breast cancer Mother 1  . Prostate cancer Paternal Grandfather        dx in his 23s  . Leukemia Paternal Grandfather   . Lung cancer Maternal Uncle        heavy smoker  . Other Paternal Uncle        farm accident  . Stroke Maternal Grandfather     Social History   Social History  . Marital status: Single    Spouse name: N/A  . Number of children: N/A  . Years of education: N/A   Occupational History  . Not on file.   Social History Main Topics  . Smoking status: Never Smoker  . Smokeless tobacco: Former Systems developer  . Alcohol use Yes     Comment: once a year  . Drug use: No  . Sexual activity: Yes   Other Topics Concern  . Not on file   Social History Narrative  . No narrative on file    Past Medical History, Surgical history, Social history, and Family history were reviewed and updated as appropriate.   Please see review of systems for further details on the patient's review from today.   Review of Systems:  Review of Systems  Constitutional: Negative for chills, diaphoresis and fever.  HENT: Negative for trouble swallowing.   Respiratory: Negative for cough, chest tightness and shortness of breath.   Cardiovascular: Negative for chest pain, palpitations and leg swelling.  Gastrointestinal: Positive for abdominal pain (Epigastric and stomach burning). Negative for nausea and vomiting.    Objective:   Physical Exam:  There were no vitals taken for this visit. ECOG: 0  Physical Exam  Constitutional: No distress.  HENT:  Head: Normocephalic and atraumatic.  Cardiovascular: Normal rate, regular rhythm and normal heart sounds.  Exam reveals no gallop and no friction rub.    No murmur heard. Pulmonary/Chest: Effort normal and breath sounds normal. No respiratory distress. She has no wheezes. She has no rales.    Abdominal: Soft. Bowel sounds are normal. She exhibits no distension. There is no tenderness. There is no rebound and no guarding.  Neurological: She is alert.  Skin: Skin is warm and dry. She is not diaphoretic.  Psychiatric: She has a normal mood and affect. Her behavior is normal. Judgment and thought content normal.    Lab Review:     Component Value Date/Time   NA 137 08/28/2017 1323   K 4.0 08/28/2017 1323   CL 106 07/15/2017 1315   CO2 20 (L) 08/28/2017 1323   GLUCOSE 151 (H) 08/28/2017 1323   BUN 12.8 08/28/2017 1323   CREATININE 0.9 08/28/2017 1323   CALCIUM 9.3 08/28/2017 1323   PROT 7.2 08/28/2017 1323   ALBUMIN 4.2 08/28/2017 1323   AST 12 08/28/2017 1323   ALT 8 08/28/2017 1323   ALKPHOS 64 08/28/2017 1323  BILITOT 0.37 08/28/2017 1323   GFRNONAA >60 07/15/2017 1315   GFRAA >60 07/15/2017 1315       Component Value Date/Time   WBC 8.5 08/28/2017 1323   WBC 4.9 07/15/2017 1315   RBC 3.94 08/28/2017 1323   RBC 4.18 07/15/2017 1315   HGB 12.1 08/28/2017 1323   HCT 36.7 08/28/2017 1323   PLT 197 08/28/2017 1323   MCV 93.0 08/28/2017 1323   MCH 30.7 08/28/2017 1323   MCH 30.6 07/15/2017 1315   MCHC 33.0 08/28/2017 1323   MCHC 32.7 07/15/2017 1315   RDW 12.9 08/28/2017 1323   LYMPHSABS 0.5 (L) 08/28/2017 1323   MONOABS 0.0 (L) 08/28/2017 1323   EOSABS 0.0 08/28/2017 1323   BASOSABS 0.0 08/28/2017 1323   -------------------------------  Imaging from last 24 hours (if applicable):  Radiology interpretation: No results found.

## 2017-08-29 NOTE — Progress Notes (Signed)
Pt completion of herceptin and perjeta started at 1205, pt immediately started complaining of abdominal pain/burning.  Perjeta stopped at 1210 and NS wide open, VSS.  Sandi Mealy, PA notified and orders received to administer 125 mg IV solumedrol and 20 mg IV pepcid.   Sandi Mealy, PA  re assessed pt one hour post IV pepcid and solumedrol, pt still complaint of GI burning.  Orders received from [place] tanner to restart pt perjeta and to administer one GI cocktail to pt. Perjeta restarted at 1321 and pt received GI Cocktail.  30 minutes post GI cocktail pt states she has improvement in GI discomfort.

## 2017-08-29 NOTE — Patient Instructions (Addendum)
[facility] Discharge Instructions for Patients Receiving Chemotherapy  Today you received the following chemotherapy agents Taxotere and Carboplain, Herceptin and Perjeta  To help prevent nausea and vomiting after your treatment, we encourage you to take your nausea medication as directed If you develop nausea and vomiting that is not controlled by your nausea medication, call the clinic.   BELOW ARE SYMPTOMS THAT SHOULD BE REPORTED IMMEDIATELY:  *FEVER GREATER THAN 100.5 F  *CHILLS WITH OR WITHOUT FEVER  NAUSEA AND VOMITING THAT IS NOT CONTROLLED WITH YOUR NAUSEA MEDICATION  *UNUSUAL SHORTNESS OF BREATH  *UNUSUAL BRUISING OR BLEEDING  TENDERNESS IN MOUTH AND THROAT WITH OR WITHOUT PRESENCE OF ULCERS  *URINARY PROBLEMS  *BOWEL PROBLEMS  UNUSUAL RASH Items with * indicate a potential emergency and should be followed up as soon as possible.  Feel free to call the clinic should you have any questions or concerns. The clinic phone number is [phone].  Please show the [facility] at check-in to the Emergency Department and triage nurse.   Docetaxel (Taxotere) injection What is this medicine? DOCETAXEL (doe se TAX el) is a chemotherapy drug. It targets fast dividing cells, like cancer cells, and causes these cells to die. This medicine is used to treat many types of cancers like breast cancer, certain stomach cancers, head and neck cancer, lung cancer, and prostate cancer. This medicine may be used for other purposes; ask your health care provider or pharmacist if you have questions. COMMON BRAND NAME(S): Docefrez, Taxotere What should I tell my health care provider before I take this medicine? They need to know if you have any of these conditions: -infection (especially a virus infection such as chickenpox, cold sores, or herpes) -liver disease -low blood counts, like low white cell, platelet, or red cell counts -an unusual or allergic reaction to  docetaxel, polysorbate 80, other chemotherapy agents, other medicines, foods, dyes, or preservatives -pregnant or trying to get pregnant -breast-feeding How should I use this medicine? This drug is given as an infusion into a vein. It is administered in a hospital or clinic by a specially trained health care professional. Talk to your pediatrician regarding the use of this medicine in children. Special care may be needed. Overdosage: If you think you have taken too much of this medicine contact a poison control center or emergency room at once. NOTE: This medicine is only for you. Do not share this medicine with others. What if I miss a dose? It is important not to miss your dose. Call your doctor or health care professional if you are unable to keep an appointment. What may interact with this medicine? -cyclosporine -erythromycin -ketoconazole -medicines to increase blood counts like filgrastim, pegfilgrastim, sargramostim -vaccines Talk to your doctor or health care professional before taking any of these medicines: -acetaminophen -aspirin -ibuprofen -ketoprofen -naproxen This list may not describe all possible interactions. Give your health care provider a list of all the medicines, herbs, non-prescription drugs, or dietary supplements you use. Also tell them if you smoke, drink alcohol, or use illegal drugs. Some items may interact with your medicine. What should I watch for while using this medicine? Your condition will be monitored carefully while you are receiving this medicine. You will need important blood work done while you are taking this medicine. This drug may make you feel generally unwell. This is not uncommon, as chemotherapy can affect healthy cells as well as cancer cells. Report any side effects. Continue your course of treatment even  though you feel ill unless your doctor tells you to stop. In some cases, you may be given additional medicines to help with side effects.  Follow all directions for their use. Call your doctor or health care professional for advice if you get a fever, chills or sore throat, or other symptoms of a cold or flu. Do not treat yourself. This drug decreases your body's ability to fight infections. Try to avoid being around people who are sick. This medicine may increase your risk to bruise or bleed. Call your doctor or health care professional if you notice any unusual bleeding. This medicine may contain alcohol in the product. You may get drowsy or dizzy. Do not drive, use machinery, or do anything that needs mental alertness until you know how this medicine affects you. Do not stand or sit up quickly, especially if you are an older patient. This reduces the risk of dizzy or fainting spells. Avoid alcoholic drinks. Do not become pregnant while taking this medicine. Women should inform their doctor if they wish to become pregnant or think they might be pregnant. There is a potential for serious side effects to an unborn child. Talk to your health care professional or pharmacist for more information. Do not breast-feed an infant while taking this medicine. What side effects may I notice from receiving this medicine? Side effects that you should report to your doctor or health care professional as soon as possible: -allergic reactions like skin rash, itching or hives, swelling of the face, lips, or tongue -low blood counts - This drug may decrease the number of white blood cells, red blood cells and platelets. You may be at increased risk for infections and bleeding. -signs of infection - fever or chills, cough, sore throat, pain or difficulty passing urine -signs of decreased platelets or bleeding - bruising, pinpoint red spots on the skin, black, tarry stools, nosebleeds -signs of decreased red blood cells - unusually weak or tired, fainting spells, lightheadedness -breathing problems -fast or irregular heartbeat -low blood pressure -mouth  sores -nausea and vomiting -pain, swelling, redness or irritation at the injection site -pain, tingling, numbness in the hands or feet -swelling of the ankle, feet, hands -weight gain Side effects that usually do not require medical attention (report to your doctor or health care professional if they continue or are bothersome): -bone pain -complete hair loss including hair on your head, underarms, pubic hair, eyebrows, and eyelashes -diarrhea -excessive tearing -changes in the color of fingernails -loosening of the fingernails -nausea -muscle pain -red flush to skin -sweating -weak or tired This list may not describe all possible side effects. Call your doctor for medical advice about side effects. You may report side effects to FDA at 1-800-FDA-1088. Where should I keep my medicine? This drug is given in a hospital or clinic and will not be stored at home. NOTE: This sheet is a summary. It may not cover all possible information. If you have questions about this medicine, talk to your doctor, pharmacist, or health care provider.  2018 [employer]/Gold Standard (2015-12-07 12:32:56)   Carboplatin injection What is this medicine? CARBOPLATIN (KAR boe pla tin) is a chemotherapy drug. It targets fast dividing cells, like cancer cells, and causes these cells to die. This medicine is used to treat ovarian cancer and many other cancers. This medicine may be used for other purposes; ask your health care provider or pharmacist if you have questions. COMMON BRAND NAME(S): Paraplatin What should I tell my health care provider before  I take this medicine? They need to know if you have any of these conditions: -blood disorders -hearing problems -kidney disease -recent or ongoing radiation therapy -an unusual or allergic reaction to carboplatin, cisplatin, other chemotherapy, other medicines, foods, dyes, or preservatives -pregnant or trying to get pregnant -breast-feeding How should I use  this medicine? This drug is usually given as an infusion into a vein. It is administered in a hospital or clinic by a specially trained health care professional. Talk to your pediatrician regarding the use of this medicine in children. Special care may be needed. Overdosage: If you think you have taken too much of this medicine contact a poison control center or emergency room at once. NOTE: This medicine is only for you. Do not share this medicine with others. What if I miss a dose? It is important not to miss a dose. Call your doctor or health care professional if you are unable to keep an appointment. What may interact with this medicine? -medicines for seizures -medicines to increase blood counts like filgrastim, pegfilgrastim, sargramostim -some antibiotics like amikacin, gentamicin, neomycin, streptomycin, tobramycin -vaccines Talk to your doctor or health care professional before taking any of these medicines: -acetaminophen -aspirin -ibuprofen -ketoprofen -naproxen This list may not describe all possible interactions. Give your health care provider a list of all the medicines, herbs, non-prescription drugs, or dietary supplements you use. Also tell them if you smoke, drink alcohol, or use illegal drugs. Some items may interact with your medicine. What should I watch for while using this medicine? Your condition will be monitored carefully while you are receiving this medicine. You will need important blood work done while you are taking this medicine. This drug may make you feel generally unwell. This is not uncommon, as chemotherapy can affect healthy cells as well as cancer cells. Report any side effects. Continue your course of treatment even though you feel ill unless your doctor tells you to stop. In some cases, you may be given additional medicines to help with side effects. Follow all directions for their use. Call your doctor or health care professional for advice if you get a  fever, chills or sore throat, or other symptoms of a cold or flu. Do not treat yourself. This drug decreases your body's ability to fight infections. Try to avoid being around people who are sick. This medicine may increase your risk to bruise or bleed. Call your doctor or health care professional if you notice any unusual bleeding. Be careful brushing and flossing your teeth or using a toothpick because you may get an infection or bleed more easily. If you have any dental work done, tell your dentist you are receiving this medicine. Avoid taking products that contain aspirin, acetaminophen, ibuprofen, naproxen, or ketoprofen unless instructed by your doctor. These medicines may hide a fever. Do not become pregnant while taking this medicine. Women should inform their doctor if they wish to become pregnant or think they might be pregnant. There is a potential for serious side effects to an unborn child. Talk to your health care professional or pharmacist for more information. Do not breast-feed an infant while taking this medicine. What side effects may I notice from receiving this medicine? Side effects that you should report to your doctor or health care professional as soon as possible: -allergic reactions like skin rash, itching or hives, swelling of the face, lips, or tongue -signs of infection - fever or chills, cough, sore throat, pain or difficulty passing urine -signs of  decreased platelets or bleeding - bruising, pinpoint red spots on the skin, black, tarry stools, nosebleeds -signs of decreased red blood cells - unusually weak or tired, fainting spells, lightheadedness -breathing problems -changes in hearing -changes in vision -chest pain -high blood pressure -low blood counts - This drug may decrease the number of white blood cells, red blood cells and platelets. You may be at increased risk for infections and bleeding. -nausea and vomiting -pain, swelling, redness or irritation at the  injection site -pain, tingling, numbness in the hands or feet -problems with balance, talking, walking -trouble passing urine or change in the amount of urine Side effects that usually do not require medical attention (report to your doctor or health care professional if they continue or are bothersome): -hair loss -loss of appetite -metallic taste in the mouth or changes in taste This list may not describe all possible side effects. Call your doctor for medical advice about side effects. You may report side effects to FDA at 1-800-FDA-1088. Where should I keep my medicine? This drug is given in a hospital or clinic and will not be stored at home. NOTE: This sheet is a summary. It may not cover all possible information. If you have questions about this medicine, talk to your doctor, pharmacist, or health care provider.  2018 [employer]/Gold Standard (2008-02-09 14:38:05)  Trastuzumab (Herceptin) injection for infusion What is this medicine? TRASTUZUMAB (tras TOO zoo mab) is a monoclonal antibody. It is used to treat breast cancer and stomach cancer. This medicine may be used for other purposes; ask your health care provider or pharmacist if you have questions. COMMON BRAND NAME(S): Herceptin What should I tell my health care provider before I take this medicine? They need to know if you have any of these conditions: -heart disease -heart failure -lung or breathing disease, like asthma -an unusual or allergic reaction to trastuzumab, benzyl alcohol, or other medications, foods, dyes, or preservatives -pregnant or trying to get pregnant -breast-feeding How should I use this medicine? This drug is given as an infusion into a vein. It is administered in a hospital or clinic by a specially trained health care professional. Talk to your pediatrician regarding the use of this medicine in children. This medicine is not approved for use in children. Overdosage: If you think you have taken too  much of this medicine contact a poison control center or emergency room at once. NOTE: This medicine is only for you. Do not share this medicine with others. What if I miss a dose? It is important not to miss a dose. Call your doctor or health care professional if you are unable to keep an appointment. What may interact with this medicine? This medicine may interact with the following medications: -certain types of chemotherapy, such as daunorubicin, doxorubicin, epirubicin, and idarubicin This list may not describe all possible interactions. Give your health care provider a list of all the medicines, herbs, non-prescription drugs, or dietary supplements you use. Also tell them if you smoke, drink alcohol, or use illegal drugs. Some items may interact with your medicine. What should I watch for while using this medicine? Visit your doctor for checks on your progress. Report any side effects. Continue your course of treatment even though you feel ill unless your doctor tells you to stop. Call your doctor or health care professional for advice if you get a fever, chills or sore throat, or other symptoms of a cold or flu. Do not treat yourself. Try to avoid being around  people who are sick. You may experience fever, chills and shaking during your first infusion. These effects are usually mild and can be treated with other medicines. Report any side effects during the infusion to your health care professional. Fever and chills usually do not happen with later infusions. Do not become pregnant while taking this medicine or for 7 months after stopping it. Women should inform their doctor if they wish to become pregnant or think they might be pregnant. Women of child-bearing potential will need to have a negative pregnancy test before starting this medicine. There is a potential for serious side effects to an unborn child. Talk to your health care professional or pharmacist for more information. Do not  breast-feed an infant while taking this medicine or for 7 months after stopping it. Women must use effective birth control with this medicine. What side effects may I notice from receiving this medicine? Side effects that you should report to your doctor or health care professional as soon as possible: -allergic reactions like skin rash, itching or hives, swelling of the face, lips, or tongue -chest pain or palpitations -cough -dizziness -feeling faint or lightheaded, falls -fever -general ill feeling or flu-like symptoms -signs of worsening heart failure like breathing problems; swelling in your legs and feet -unusually weak or tired Side effects that usually do not require medical attention (report to your doctor or health care professional if they continue or are bothersome): -bone pain -changes in taste -diarrhea -joint pain -nausea/vomiting -weight loss This list may not describe all possible side effects. Call your doctor for medical advice about side effects. You may report side effects to FDA at 1-800-FDA-1088. Where should I keep my medicine? This drug is given in a hospital or clinic and will not be stored at home. NOTE: This sheet is a summary. It may not cover all possible information. If you have questions about this medicine, talk to your doctor, pharmacist, or health care provider.  2018 [employer]/Gold Standard (2016-10-29 14:37:52)  Pertuzumab (Perjeta) injection What is this medicine? PERTUZUMAB (per TOOZ ue mab) is a monoclonal antibody. It is used to treat breast cancer. This medicine may be used for other purposes; ask your health care provider or pharmacist if you have questions. COMMON BRAND NAME(S): PERJETA What should I tell my health care provider before I take this medicine? They need to know if you have any of these conditions: -heart disease -heart failure -high blood pressure -history of irregular heart beat -recent or ongoing radiation therapy -an  unusual or allergic reaction to pertuzumab, other medicines, foods, dyes, or preservatives -pregnant or trying to get pregnant -breast-feeding How should I use this medicine? This medicine is for infusion into a vein. It is given by a health care professional in a hospital or clinic setting. Talk to your pediatrician regarding the use of this medicine in children. Special care may be needed. Overdosage: If you think you have taken too much of this medicine contact a poison control center or emergency room at once. NOTE: This medicine is only for you. Do not share this medicine with others. What if I miss a dose? It is important not to miss your dose. Call your doctor or health care professional if you are unable to keep an appointment. What may interact with this medicine? Interactions are not expected. Give your health care provider a list of all the medicines, herbs, non-prescription drugs, or dietary supplements you use. Also tell them if you smoke, drink alcohol, or use  illegal drugs. Some items may interact with your medicine. This list may not describe all possible interactions. Give your health care provider a list of all the medicines, herbs, non-prescription drugs, or dietary supplements you use. Also tell them if you smoke, drink alcohol, or use illegal drugs. Some items may interact with your medicine. What should I watch for while using this medicine? Your condition will be monitored carefully while you are receiving this medicine. Report any side effects. Continue your course of treatment even though you feel ill unless your doctor tells you to stop. Do not become pregnant while taking this medicine or for 7 months after stopping it. Women should inform their doctor if they wish to become pregnant or think they might be pregnant. Women of child-bearing potential will need to have a negative pregnancy test before starting this medicine. There is a potential for serious side effects to an  unborn child. Talk to your health care professional or pharmacist for more information. Do not breast-feed an infant while taking this medicine or for 7 months after stopping it. Women must use effective birth control with this medicine. Call your doctor or health care professional for advice if you get a fever, chills or sore throat, or other symptoms of a cold or flu. Do not treat yourself. Try to avoid being around people who are sick. You may experience fever, chills, and headache during the infusion. Report any side effects during the infusion to your health care professional. What side effects may I notice from receiving this medicine? Side effects that you should report to your doctor or health care professional as soon as possible: -breathing problems -chest pain or palpitations -dizziness -feeling faint or lightheaded -fever or chills -skin rash, itching or hives -sore throat -swelling of the face, lips, or tongue -swelling of the legs or ankles -unusually weak or tired Side effects that usually do not require medical attention (report to your doctor or health care professional if they continue or are bothersome): -diarrhea -hair loss -nausea, vomiting -tiredness This list may not describe all possible side effects. Call your doctor for medical advice about side effects. You may report side effects to FDA at 1-800-FDA-1088. Where should I keep my medicine? This drug is given in a hospital or clinic and will not be stored at home. NOTE: This sheet is a summary. It may not cover all possible information. If you have questions about this medicine, talk to your doctor, pharmacist, or health care provider.  2018 [employer]/Gold Standard (2015-12-07 12:08:50)

## 2017-09-01 ENCOUNTER — Encounter: Payer: Self-pay | Admitting: Physical Therapy

## 2017-09-01 ENCOUNTER — Encounter: Payer: Self-pay | Admitting: Hematology

## 2017-09-01 ENCOUNTER — Ambulatory Visit: Payer: PRIVATE HEALTH INSURANCE | Admitting: Physical Therapy

## 2017-09-01 DIAGNOSIS — M25611 Stiffness of right shoulder, not elsewhere classified: Secondary | ICD-10-CM

## 2017-09-01 DIAGNOSIS — D709 Neutropenia, unspecified: Secondary | ICD-10-CM | POA: Diagnosis not present

## 2017-09-01 DIAGNOSIS — A419 Sepsis, unspecified organism: Secondary | ICD-10-CM | POA: Diagnosis not present

## 2017-09-01 DIAGNOSIS — R293 Abnormal posture: Secondary | ICD-10-CM

## 2017-09-01 DIAGNOSIS — M25612 Stiffness of left shoulder, not elsewhere classified: Secondary | ICD-10-CM

## 2017-09-01 NOTE — Therapy (Signed)
[place], [place], [address] Phone: [phone]   Fax:  [phone]  Physical Therapy Treatment  Patient Details  Name: Sheila [name] MRN: [id number] Date of Birth: 10/26/1976 Referring Provider: Dr. [name]  Encounter Date: 09/01/2017      PT End of Session - 09/01/17 1652    Visit Number 6   Number of Visits 9   Date for PT Re-Evaluation 09/05/17   PT Start Time 1518   PT Stop Time 1601   PT Time Calculation (min) 43 min   Activity Tolerance Patient tolerated treatment well   Behavior During Therapy [facility] for tasks assessed/performed      Past Medical History:  Diagnosis Date  . Anxiety   . Cancer [facility])    right breast   . Family history of breast cancer   . Family history of colon cancer   . GERD (gastroesophageal reflux disease)   . Headache    couple of migraines in the past  . History of kidney stones   . Vitamin D deficiency     Past Surgical History:  Procedure Laterality Date  . egg donation    . kidney stone removal    . MASTECTOMY W/ SENTINEL NODE BIOPSY Bilateral 07/22/2017   Procedure: RIGHT TOTAL MASTECTOMY WITH RIGHT AXILLARY SENTINEL LYMPH NODE BIOPSY, LEFT PROPHYLACTIC MASTECTOMY;  Surgeon: [name], MD;  Location: [place];  Service: General;  Laterality: Bilateral;  . PORTACATH PLACEMENT Right 07/22/2017   Procedure: INSERTION PORT-A-CATH WITH [name];  Surgeon: [name], MD;  Location: [place];  Service: General;  Laterality: Right;    There were no vitals filed for this visit.      Subjective Assessment - 09/01/17 1519    Subjective I felt pretty good after the stretching. I did not have any soreness. I started the chemo and I feel some fatigue.    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 It measures 5.5 cm and is located in the upper outer quadrant with a  Ki67 of 12%. , Bilateral mastectomies on 07/22/17 with SLNB on R with 2 to 3 additional nodes taken, pt has to complete chemo and radiation and will begin chemo on Aug 29, 2017   Patient Stated Goals to return to prior level of function, be able to sleep with no discomfort, get ROM back to where it was   Currently in Pain? No/denies   Pain Score 0-No pain            [facility] PT Assessment - 09/01/17 0001      AROM   Right Shoulder Flexion 130 Degrees   Right Shoulder ABduction 96 Degrees  prior to PROM, 103 after PROM   Left Shoulder Flexion 149 Degrees   Left Shoulder ABduction 156 Degrees                     [facility] Adult PT Treatment/Exercise - 09/01/17 0001      Shoulder Exercises: Standing   Other Standing Exercises windshield wipers x 10 bilaterally with washcloth on wall     Shoulder Exercises: Pulleys   Flexion 2 minutes  with stretch at end range   ABduction 2 minutes  with stretch at end range     Shoulder Exercises: Therapy Ball   Flexion 10 reps  with stretch at end   ABduction 10 reps  on left and right  Manual Therapy   Passive ROM within pain limits for right shoulder in flexion, abduction, ER, and IR                      Breast Clinic Goals - 06/18/17 1221      Patient will be able to verbalize understanding of pertinent lymphedema risk reduction practices relevant to her diagnosis specifically related to skin care.   Time 1   Period Days   Status Achieved     Patient will be able to return demonstrate and/or verbalize understanding of the post-op home exercise program related to regaining shoulder range of motion.   Time 1   Period Days   Status Achieved     Patient will be able to verbalize understanding of the importance of attending the postoperative After Breast Cancer Class for further lymphedema risk reduction education and therapeutic exercise.   Time 1   Period Days   Status Achieved          Long Term  Clinic Goals - 08/25/17 0901      CC Long Term Goal  #1   Title Pt to demonstrate 165 degrees of bilateral shoulder flexion to allow her to reach up and overhead   Baseline R 98, L 125, 08/25/17- R 122, L 141   Time 4   Period Weeks   Status On-going     CC Long Term Goal  #2   Title Pt to demonstrate 165 degrees of bilateral shoulder abduction to allow pt to reach out to sides   Baseline R 74, L 86, 08/25/17- R 92, L 141   Time 4   Period Weeks   Status On-going     CC Long Term Goal  #3   Title Pt to demonstrate 70 degrees of right shoulder internal rotation to allow her to return to prior level of function   Baseline 48, 08/25/17- 51   Time 4   Period Weeks   Status On-going     CC Long Term Goal  #4   Title Pt to report a 75% improvement in right trunk edema to allow improved comfort and sleep   Baseline 08/25/17- 60%    Time 4   Period Weeks   Status On-going     CC Long Term Goal  #5   Title Pt to be independent in a home exercise program for continued strengthening and stretching   Time 4   Period Weeks   Status On-going            Plan - 09/01/17 1653    Clinical Impression Statement Pt started chemotherapy and has not stretched much over the weekend. Her right shoulder was more tight today but her left shoulder AROM has improved greatly since last session especially in the direction of abduction. Encouraged pt to continue with stretching at home. She did gain several more degrees of right shoulder AROM following PROM and AAROM exercises.    Rehab Potential Good   Clinical Impairments Affecting Rehab Potential pt to begin chemo Oct 12 and will have it every 3 weeks    PT Frequency 2x / week   PT Duration 4 weeks   PT Treatment/Interventions ADLs/Self Care Home Management;Therapeutic activities;Therapeutic exercise;Orthotic Fit/Training;Patient/family education;Manual techniques;Manual lymph drainage;Scar mobilization;Passive range of motion;Taping   PT Next Visit  Plan continue pulleys and ball, do PROM to bilat shoulders,   add active exercsie as able.  later teach Strength ABC program.    PT  Home Exercise Plan Post op shoulder ROM HEP advised pt to not lift arms above 90 degrees until a week after drains removed, supine dowel   Consulted and Agree with Plan of Care Patient      Patient will benefit from skilled therapeutic intervention in order to improve the following deficits and impairments:  Postural dysfunction, Decreased knowledge of precautions, Pain, Impaired UE functional use, Decreased range of motion, Decreased strength, Decreased scar mobility, Increased fascial restricitons  Visit Diagnosis: Stiffness of left shoulder, not elsewhere classified  Stiffness of right shoulder, not elsewhere classified  Abnormal posture     Problem List Patient Active Problem List   Diagnosis Date Noted  . Port-A-Cath in place 08/28/2017  . Genetic testing 06/25/2017  . Family history of breast cancer   . Family history of colon cancer   . Malignant neoplasm of upper-outer quadrant of right breast in female, estrogen receptor positive ([place]) 06/16/2017    Allyson Sabal [facility] 09/01/2017, 4:55 PM  [place], [place], [address] Phone: [phone]   Fax:  [phone]  Name: Sheila [name] MRN: [id number] Date of Birth: July 30, 1976  Manus Gunning, PT 09/01/17 4:55 PM

## 2017-09-02 ENCOUNTER — Ambulatory Visit (HOSPITAL_BASED_OUTPATIENT_CLINIC_OR_DEPARTMENT_OTHER): Payer: PRIVATE HEALTH INSURANCE

## 2017-09-02 VITALS — BP 111/68 | HR 97 | Temp 98.3°F | Resp 18

## 2017-09-02 DIAGNOSIS — C50411 Malignant neoplasm of upper-outer quadrant of right female breast: Secondary | ICD-10-CM | POA: Diagnosis not present

## 2017-09-02 DIAGNOSIS — Z5189 Encounter for other specified aftercare: Secondary | ICD-10-CM

## 2017-09-02 DIAGNOSIS — Z170 Estrogen receptor positive status [ER+]: Principal | ICD-10-CM

## 2017-09-02 MED ORDER — TBO-FILGRASTIM 480 MCG/0.8ML ~~LOC~~ SOSY
480.0000 ug | PREFILLED_SYRINGE | Freq: Once | SUBCUTANEOUS | Status: AC
  Administered 2017-09-02: 480 ug via SUBCUTANEOUS
  Filled 2017-09-02: qty 0.8

## 2017-09-03 ENCOUNTER — Ambulatory Visit (HOSPITAL_BASED_OUTPATIENT_CLINIC_OR_DEPARTMENT_OTHER): Payer: PRIVATE HEALTH INSURANCE

## 2017-09-03 ENCOUNTER — Ambulatory Visit: Payer: PRIVATE HEALTH INSURANCE | Admitting: Physical Therapy

## 2017-09-03 VITALS — BP 112/65 | HR 85 | Temp 98.5°F | Resp 18

## 2017-09-03 DIAGNOSIS — Z170 Estrogen receptor positive status [ER+]: Principal | ICD-10-CM

## 2017-09-03 DIAGNOSIS — C50411 Malignant neoplasm of upper-outer quadrant of right female breast: Secondary | ICD-10-CM

## 2017-09-03 DIAGNOSIS — D709 Neutropenia, unspecified: Secondary | ICD-10-CM | POA: Diagnosis not present

## 2017-09-03 DIAGNOSIS — Z5189 Encounter for other specified aftercare: Secondary | ICD-10-CM | POA: Diagnosis not present

## 2017-09-03 MED ORDER — TBO-FILGRASTIM 480 MCG/0.8ML ~~LOC~~ SOSY
480.0000 ug | PREFILLED_SYRINGE | Freq: Once | SUBCUTANEOUS | Status: AC
  Administered 2017-09-03: 480 ug via SUBCUTANEOUS
  Filled 2017-09-03: qty 0.8

## 2017-09-04 ENCOUNTER — Emergency Department (HOSPITAL_COMMUNITY): Payer: PRIVATE HEALTH INSURANCE

## 2017-09-04 ENCOUNTER — Ambulatory Visit: Payer: PRIVATE HEALTH INSURANCE

## 2017-09-04 ENCOUNTER — Encounter (HOSPITAL_COMMUNITY): Payer: Self-pay | Admitting: Emergency Medicine

## 2017-09-04 ENCOUNTER — Telehealth: Payer: Self-pay | Admitting: Emergency Medicine

## 2017-09-04 ENCOUNTER — Other Ambulatory Visit (HOSPITAL_BASED_OUTPATIENT_CLINIC_OR_DEPARTMENT_OTHER): Payer: PRIVATE HEALTH INSURANCE

## 2017-09-04 ENCOUNTER — Other Ambulatory Visit: Payer: Self-pay

## 2017-09-04 ENCOUNTER — Ambulatory Visit (HOSPITAL_BASED_OUTPATIENT_CLINIC_OR_DEPARTMENT_OTHER): Payer: PRIVATE HEALTH INSURANCE | Admitting: Nurse Practitioner

## 2017-09-04 ENCOUNTER — Inpatient Hospital Stay (HOSPITAL_COMMUNITY)
Admission: EM | Admit: 2017-09-04 | Discharge: 2017-09-09 | DRG: 872 | Disposition: A | Discharge status: Home / Self Care | Payer: PRIVATE HEALTH INSURANCE | Attending: Internal Medicine | Admitting: Internal Medicine

## 2017-09-04 VITALS — BP 110/71 | HR 115 | Temp 100.4°F | Resp 22 | Ht 68.0 in

## 2017-09-04 DIAGNOSIS — D6959 Other secondary thrombocytopenia: Secondary | ICD-10-CM | POA: Diagnosis present

## 2017-09-04 DIAGNOSIS — Z801 Family history of malignant neoplasm of trachea, bronchus and lung: Secondary | ICD-10-CM

## 2017-09-04 DIAGNOSIS — Z823 Family history of stroke: Secondary | ICD-10-CM | POA: Diagnosis not present

## 2017-09-04 DIAGNOSIS — R000 Tachycardia, unspecified: Secondary | ICD-10-CM | POA: Diagnosis present

## 2017-09-04 DIAGNOSIS — Z5189 Encounter for other specified aftercare: Secondary | ICD-10-CM | POA: Diagnosis not present

## 2017-09-04 DIAGNOSIS — K219 Gastro-esophageal reflux disease without esophagitis: Secondary | ICD-10-CM | POA: Diagnosis present

## 2017-09-04 DIAGNOSIS — K9281 Gastrointestinal mucositis (ulcerative): Secondary | ICD-10-CM | POA: Diagnosis present

## 2017-09-04 DIAGNOSIS — D709 Neutropenia, unspecified: Secondary | ICD-10-CM

## 2017-09-04 DIAGNOSIS — K5900 Constipation, unspecified: Secondary | ICD-10-CM | POA: Diagnosis present

## 2017-09-04 DIAGNOSIS — Z79899 Other long term (current) drug therapy: Secondary | ICD-10-CM

## 2017-09-04 DIAGNOSIS — Z803 Family history of malignant neoplasm of breast: Secondary | ICD-10-CM

## 2017-09-04 DIAGNOSIS — C50411 Malignant neoplasm of upper-outer quadrant of right female breast: Secondary | ICD-10-CM

## 2017-09-04 DIAGNOSIS — Z9221 Personal history of antineoplastic chemotherapy: Secondary | ICD-10-CM | POA: Diagnosis not present

## 2017-09-04 DIAGNOSIS — A419 Sepsis, unspecified organism: Principal | ICD-10-CM | POA: Diagnosis present

## 2017-09-04 DIAGNOSIS — C50919 Malignant neoplasm of unspecified site of unspecified female breast: Secondary | ICD-10-CM | POA: Diagnosis not present

## 2017-09-04 DIAGNOSIS — Z806 Family history of leukemia: Secondary | ICD-10-CM

## 2017-09-04 DIAGNOSIS — D703 Neutropenia due to infection: Secondary | ICD-10-CM | POA: Diagnosis present

## 2017-09-04 DIAGNOSIS — T451X5A Adverse effect of antineoplastic and immunosuppressive drugs, initial encounter: Secondary | ICD-10-CM | POA: Diagnosis present

## 2017-09-04 DIAGNOSIS — Z170 Estrogen receptor positive status [ER+]: Secondary | ICD-10-CM

## 2017-09-04 DIAGNOSIS — R509 Fever, unspecified: Secondary | ICD-10-CM

## 2017-09-04 DIAGNOSIS — D6481 Anemia due to antineoplastic chemotherapy: Secondary | ICD-10-CM | POA: Diagnosis present

## 2017-09-04 DIAGNOSIS — E876 Hypokalemia: Secondary | ICD-10-CM | POA: Diagnosis not present

## 2017-09-04 DIAGNOSIS — T380X5A Adverse effect of glucocorticoids and synthetic analogues, initial encounter: Secondary | ICD-10-CM | POA: Diagnosis present

## 2017-09-04 DIAGNOSIS — K1230 Oral mucositis (ulcerative), unspecified: Secondary | ICD-10-CM | POA: Diagnosis present

## 2017-09-04 DIAGNOSIS — R197 Diarrhea, unspecified: Secondary | ICD-10-CM | POA: Diagnosis not present

## 2017-09-04 DIAGNOSIS — Z7951 Long term (current) use of inhaled steroids: Secondary | ICD-10-CM

## 2017-09-04 DIAGNOSIS — R5081 Fever presenting with conditions classified elsewhere: Secondary | ICD-10-CM

## 2017-09-04 DIAGNOSIS — Z95828 Presence of other vascular implants and grafts: Secondary | ICD-10-CM

## 2017-09-04 DIAGNOSIS — N83201 Unspecified ovarian cyst, right side: Secondary | ICD-10-CM | POA: Diagnosis present

## 2017-09-04 DIAGNOSIS — E871 Hypo-osmolality and hyponatremia: Secondary | ICD-10-CM | POA: Diagnosis present

## 2017-09-04 DIAGNOSIS — R740 Nonspecific elevation of levels of transaminase and lactic acid dehydrogenase [LDH]: Secondary | ICD-10-CM | POA: Diagnosis present

## 2017-09-04 DIAGNOSIS — Z87442 Personal history of urinary calculi: Secondary | ICD-10-CM

## 2017-09-04 DIAGNOSIS — D701 Agranulocytosis secondary to cancer chemotherapy: Secondary | ICD-10-CM | POA: Diagnosis not present

## 2017-09-04 DIAGNOSIS — Z800 Family history of malignant neoplasm of digestive organs: Secondary | ICD-10-CM | POA: Diagnosis not present

## 2017-09-04 DIAGNOSIS — Z9013 Acquired absence of bilateral breasts and nipples: Secondary | ICD-10-CM | POA: Diagnosis not present

## 2017-09-04 DIAGNOSIS — R739 Hyperglycemia, unspecified: Secondary | ICD-10-CM | POA: Diagnosis present

## 2017-09-04 DIAGNOSIS — Z1501 Genetic susceptibility to malignant neoplasm of breast: Secondary | ICD-10-CM

## 2017-09-04 LAB — COMPREHENSIVE METABOLIC PANEL
ALBUMIN: 3.8 g/dL (ref 3.5–5.0)
ALBUMIN: 3.5 g/dL (ref 3.5–5.0)
ALK PHOS: 57 U/L (ref 40–150)
ALK PHOS: 52 U/L (ref 38–126)
ALT: 13 U/L (ref 0–55)
ALT: 16 U/L (ref 14–54)
ANION GAP: 10 meq/L (ref 3–11)
AST: 12 U/L (ref 5–34)
AST: 19 U/L (ref 15–41)
Anion gap: 11 (ref 5–15)
BILIRUBIN TOTAL: 1.2 mg/dL (ref 0.3–1.2)
BUN: 14.1 mg/dL (ref 7.0–26.0)
BUN: 11 mg/dL (ref 6–20)
CO2: 25 mEq/L (ref 22–29)
CO2: 23 mmol/L (ref 22–32)
Calcium: 8.9 mg/dL (ref 8.4–10.4)
Calcium: 8.4 mg/dL — ABNORMAL LOW (ref 8.9–10.3)
Chloride: 98 mEq/L (ref 98–109)
Chloride: 98 mmol/L — ABNORMAL LOW (ref 101–111)
Creatinine, Ser: 0.76 mg/dL (ref 0.44–1.00)
Creatinine: 0.9 mg/dL (ref 0.6–1.1)
GFR calc Af Amer: >60 mL/min (ref >60)
GLUCOSE: 110 mg/dL — AB (ref 65–99)
Glucose: 137 mg/dl (ref 70–140)
POTASSIUM: 4.1 meq/L (ref 3.5–5.1)
Potassium: 3.8 mmol/L (ref 3.5–5.1)
Sodium: 134 mEq/L — ABNORMAL LOW (ref 136–145)
Sodium: 132 mmol/L — ABNORMAL LOW (ref 135–145)
TOTAL PROTEIN: 6.8 g/dL (ref 6.4–8.3)
Total Bilirubin: 1 mg/dL (ref 0.20–1.20)
Total Protein: 6.5 g/dL (ref 6.5–8.1)

## 2017-09-04 LAB — CBC WITH DIFFERENTIAL/PLATELET
BASO%: 0 % (ref 0.0–2.0)
BASOS ABS: 0 10*3/uL (ref 0.0–0.1)
Band Neutrophils: 0 %
Basophils Absolute: 0 10*3/uL (ref 0.0–0.1)
Basophils Relative: 0 %
Blasts: 0 %
EOS ABS: 0 10*3/uL (ref 0.0–0.5)
EOS PCT: 1 %
EOS%: 3.9 % (ref 0.0–7.0)
Eosinophils Absolute: 0 10*3/uL (ref 0.0–0.7)
HCT: 36.8 % (ref 34.8–46.6)
HCT: 34.3 % — ABNORMAL LOW (ref 36.0–46.0)
HEMOGLOBIN: 12.4 g/dL (ref 11.6–15.9)
HEMOGLOBIN: 11.6 g/dL — AB (ref 12.0–15.0)
LYMPH%: 90.8 % — AB (ref 14.0–49.7)
Lymphocytes Relative: 94 %
Lymphs Abs: 0.7 10*3/uL (ref 0.7–4.0)
MCH: 30.8 pg (ref 25.1–34.0)
MCH: 30.3 pg (ref 26.0–34.0)
MCHC: 33.7 g/dL (ref 31.5–36.0)
MCHC: 33.8 g/dL (ref 30.0–36.0)
MCV: 91.5 fL (ref 79.5–101.0)
MCV: 89.6 fL (ref 78.0–100.0)
MONO ABS: 0 10*3/uL — AB (ref 0.1–1.0)
MONO#: 0 10*3/uL — AB (ref 0.1–0.9)
MONO%: 1.3 % (ref 0.0–14.0)
Metamyelocytes Relative: 0 %
Monocytes Relative: 3 %
Myelocytes: 0 %
NEUT%: 4 % — ABNORMAL LOW (ref 38.4–76.8)
NEUTROS ABS: 0 10*3/uL — AB (ref 1.5–6.5)
NEUTROS PCT: 2 %
NRBC: 0 /100{WBCs}
Neutro Abs: 0 10*3/uL — ABNORMAL LOW (ref 1.7–7.7)
OTHER: 0 %
PLATELETS: 154 10*3/uL (ref 145–400)
PROMYELOCYTES ABS: 0 %
Platelets: 165 10*3/uL (ref 150–400)
RBC: 4.02 10*6/uL (ref 3.70–5.45)
RBC: 3.83 MIL/uL — ABNORMAL LOW (ref 3.87–5.11)
RDW: 12.2 % (ref 11.2–14.5)
RDW: 12.1 % (ref 11.5–15.5)
WBC: 0.8 10*3/uL — AB (ref 3.9–10.3)
WBC: 0.7 10*3/uL — CL (ref 4.0–10.5)
lymph#: 0.7 10*3/uL — ABNORMAL LOW (ref 0.9–3.3)

## 2017-09-04 LAB — URINALYSIS, ROUTINE W REFLEX MICROSCOPIC
BILIRUBIN URINE: NEGATIVE
GLUCOSE, UA: 50 mg/dL — AB
HGB URINE DIPSTICK: NEGATIVE
Ketones, ur: NEGATIVE mg/dL
Leukocytes, UA: NEGATIVE
Nitrite: NEGATIVE
PH: 7 (ref 5.0–8.0)
Protein, ur: NEGATIVE mg/dL
SPECIFIC GRAVITY, URINE: 1.012 (ref 1.005–1.030)

## 2017-09-04 LAB — I-STAT CG4 LACTIC ACID, ED: LACTIC ACID, VENOUS: 2.34 mmol/L — AB (ref 0.5–1.9)

## 2017-09-04 LAB — PROTIME-INR
INR: 1.1
PROTHROMBIN TIME: 14.1 s (ref 11.4–15.2)

## 2017-09-04 LAB — APTT: APTT: 27 s (ref 24–36)

## 2017-09-04 LAB — INFLUENZA PANEL BY PCR (TYPE A & B)
INFLAPCR: NEGATIVE
Influenza B By PCR: NEGATIVE

## 2017-09-04 LAB — PROCALCITONIN

## 2017-09-04 LAB — LACTIC ACID, PLASMA: LACTIC ACID, VENOUS: 3.3 mmol/L — AB (ref 0.5–1.9)

## 2017-09-04 MED ORDER — ONDANSETRON HCL 4 MG PO TABS: 4.0000 mg | ORAL_TABLET | Freq: Four times a day (QID) | ORAL | Status: DC | PRN

## 2017-09-04 MED ORDER — ENOXAPARIN SODIUM 40 MG/0.4ML ~~LOC~~ SOLN
40.0000 mg | SUBCUTANEOUS | Status: DC
  Administered 2017-09-04 – 2017-09-06 (×3): 40 mg via SUBCUTANEOUS
  Filled 2017-09-04 (×4): qty 0.4

## 2017-09-04 MED ORDER — VANCOMYCIN HCL IN DEXTROSE 1-5 GM/200ML-% IV SOLN: 1000.0000 mg | Freq: Once | INTRAVENOUS | Status: DC

## 2017-09-04 MED ORDER — SODIUM CHLORIDE 0.9 % IV BOLUS (SEPSIS)
1000.0000 mL | Freq: Once | INTRAVENOUS | Status: AC
  Administered 2017-09-04: 1000 mL via INTRAVENOUS

## 2017-09-04 MED ORDER — SODIUM CHLORIDE 0.9 % IV SOLN
Freq: Once | INTRAVENOUS | Status: AC
  Administered 2017-09-04: 15:00:00 via INTRAVENOUS

## 2017-09-04 MED ORDER — PIPERACILLIN-TAZOBACTAM 3.375 G IVPB
3.3750 g | Freq: Three times a day (TID) | INTRAVENOUS | Status: DC
  Administered 2017-09-04 – 2017-09-07 (×8): 3.375 g via INTRAVENOUS
  Filled 2017-09-04 (×8): qty 50

## 2017-09-04 MED ORDER — FLUTICASONE PROPIONATE 50 MCG/ACT NA SUSP
2.0000 | Freq: Every day | NASAL | Status: DC
  Administered 2017-09-04 – 2017-09-09 (×5): 2 via NASAL
  Filled 2017-09-04: qty 16

## 2017-09-04 MED ORDER — SODIUM CHLORIDE 0.9 % IV BOLUS (SEPSIS)
250.0000 mL | Freq: Once | INTRAVENOUS | Status: AC
  Administered 2017-09-04: 250 mL via INTRAVENOUS

## 2017-09-04 MED ORDER — SODIUM CHLORIDE 0.9 % IV SOLN
INTRAVENOUS | Status: DC
  Administered 2017-09-04 – 2017-09-07 (×5): via INTRAVENOUS

## 2017-09-04 MED ORDER — ONDANSETRON HCL 4 MG/2ML IJ SOLN
4.0000 mg | Freq: Four times a day (QID) | INTRAMUSCULAR | Status: DC | PRN
  Administered 2017-09-04 – 2017-09-06 (×6): 4 mg via INTRAVENOUS
  Filled 2017-09-04 (×7): qty 2

## 2017-09-04 MED ORDER — SODIUM CHLORIDE 0.9% FLUSH
10.0000 mL | INTRAVENOUS | Status: DC | PRN
  Administered 2017-09-05: 10 mL
  Filled 2017-09-04: qty 40

## 2017-09-04 MED ORDER — TBO-FILGRASTIM 480 MCG/0.8ML ~~LOC~~ SOSY
480.0000 ug | PREFILLED_SYRINGE | Freq: Every day | SUBCUTANEOUS | Status: AC
  Administered 2017-09-05 – 2017-09-07 (×3): 480 ug via SUBCUTANEOUS
  Filled 2017-09-04 (×4): qty 0.8

## 2017-09-04 MED ORDER — SODIUM CHLORIDE 0.9 % IV BOLUS (SEPSIS)
500.0000 mL | Freq: Once | INTRAVENOUS | Status: AC
  Administered 2017-09-04: 500 mL via INTRAVENOUS

## 2017-09-04 MED ORDER — VANCOMYCIN HCL 10 G IV SOLR
1250.0000 mg | Freq: Once | INTRAVENOUS | Status: DC
  Filled 2017-09-04: qty 1250

## 2017-09-04 MED ORDER — TBO-FILGRASTIM 480 MCG/0.8ML ~~LOC~~ SOSY
480.0000 ug | PREFILLED_SYRINGE | Freq: Once | SUBCUTANEOUS | Status: AC
  Administered 2017-09-04: 480 ug via SUBCUTANEOUS
  Filled 2017-09-04: qty 0.8

## 2017-09-04 MED ORDER — HYDROCORTISONE NA SUCCINATE PF 100 MG IJ SOLR
100.0000 mg | Freq: Three times a day (TID) | INTRAMUSCULAR | Status: DC
  Administered 2017-09-04 – 2017-09-07 (×8): 100 mg via INTRAVENOUS
  Filled 2017-09-04 (×8): qty 2

## 2017-09-04 MED ORDER — VANCOMYCIN HCL IN DEXTROSE 750-5 MG/150ML-% IV SOLN
750.0000 mg | Freq: Three times a day (TID) | INTRAVENOUS | Status: DC
  Administered 2017-09-04 – 2017-09-07 (×8): 750 mg via INTRAVENOUS
  Filled 2017-09-04 (×8): qty 150

## 2017-09-04 MED ORDER — PIPERACILLIN-TAZOBACTAM 3.375 G IVPB
3.3750 g | Freq: Once | INTRAVENOUS | Status: AC
  Administered 2017-09-04: 3.375 g via INTRAVENOUS
  Filled 2017-09-04: qty 50

## 2017-09-04 MED ORDER — ACETAMINOPHEN 325 MG PO TABS
650.0000 mg | ORAL_TABLET | Freq: Four times a day (QID) | ORAL | Status: DC | PRN
  Administered 2017-09-04 – 2017-09-05 (×2): 650 mg via ORAL
  Filled 2017-09-04 (×3): qty 2

## 2017-09-04 MED ORDER — ENSURE ENLIVE PO LIQD
237.0000 mL | Freq: Two times a day (BID) | ORAL | Status: DC
  Administered 2017-09-06: 237 mL via ORAL

## 2017-09-04 MED ORDER — VANCOMYCIN HCL 10 G IV SOLR
1250.0000 mg | Freq: Once | INTRAVENOUS | Status: AC
  Administered 2017-09-04: 1250 mg via INTRAVENOUS
  Filled 2017-09-04: qty 1250

## 2017-09-04 MED ORDER — FLUTICASONE PROPIONATE 50 MCG/ACT NA SUSP
2.0000 | Freq: Every day | NASAL | Status: DC
  Filled 2017-09-04: qty 16

## 2017-09-04 MED ORDER — VANCOMYCIN HCL 10 G IV SOLR: 1250.0000 mg | Freq: Once | INTRAVENOUS | Status: DC

## 2017-09-04 MED ORDER — ACETAMINOPHEN 650 MG RE SUPP: 650.0000 mg | Freq: Four times a day (QID) | RECTAL | Status: DC | PRN

## 2017-09-04 MED ORDER — SODIUM CHLORIDE 0.9% FLUSH
10.0000 mL | Freq: Once | INTRAVENOUS | Status: AC
  Administered 2017-09-04: 10 mL
  Filled 2017-09-04: qty 10

## 2017-09-04 NOTE — H&P (Addendum)
[facility] History and Physical  Sheila [name] ZOX:[id number] DOB: 06/29/1976 DOA: 09/04/2017   PCP: [name], MD  Specialists: Dr. Burr Medico is her oncologist  Chief Complaint: fatigue, fever, lightheadedness  HPI: Sheila [name] is a 41 y.o. female with a past medical history of breast cancer status post bilateral mastectomy. Patient started chemotherapy on October 12. Patient mentions that she was in her usual state of health until yesterday when she started feeling weak, dizzy. She had been constipated for about 10 days and had a few loose stools yesterday. Has not had any loose stools today. Denies any dysuria. No cough. She has a rash around her Port-A-Cath but that has been present for a few days , and thought to be related to tape and local agent used around the Port-A-Cath site. She has noticed low-grade fever up until today. She was found to have a temperature of 100.8 at the [facility] today. Heart rate was noted to be elevated and she had borderline low blood pressures. She was sent over to the emergency department for further management. Denies any abdominal pain. No joint pain more than usual. She has been getting Granix injections for the past 3 days including today.  In the emergency department, patient was noted be tachycardic. Blood pressure had improved. Lactic acid level was noted to be elevated. She had neutropenia. She was started on the sepsis protocol. She has received vancomycin and Zosyn. She will need hospitalization for further management.  Home Medications: Prior to Admission medications   Medication Sig Start Date End Date Taking? Authorizing Provider  calcium carbonate (TUMS - DOSED IN MG ELEMENTAL CALCIUM) 500 MG chewable tablet Chew 2 tablets by mouth as needed for indigestion or heartburn.    Yes [provider]  Cholecalciferol (VITAMIN D) 2000 units tablet Take 2,000 Units by mouth daily.   Yes [provider]    dexamethasone (DECADRON) 4 MG tablet Take 2 tablets (8 mg total) by mouth 2 (two) times daily. Start the day before Taxotere. Then again the day after chemo for 3 days. 08/05/17  Yes [name], MD  diphenhydramine-acetaminophen (TYLENOL PM) 25-500 MG TABS tablet Take 2 tablets by mouth at bedtime. sleep   Yes [provider]  fluticasone (FLONASE) 50 MCG/ACT nasal spray Place 1 spray into both nostrils daily.   Yes [provider]  lidocaine-prilocaine (EMLA) cream Apply to affected area once 08/05/17  Yes [name], MD  naproxen sodium (ANAPROX) 220 MG tablet Take 220 mg by mouth 2 (two) times daily with a meal.   Yes [provider]  omeprazole (PRILOSEC) 20 MG capsule Take 20 mg by mouth at bedtime.    Yes [provider]  ondansetron (ZOFRAN) 8 MG tablet Take 1 tablet (8 mg total) by mouth 2 (two) times daily as needed for refractory nausea / vomiting. Start on day 3 after chemo. 08/05/17  Yes [name], MD  prochlorperazine (COMPAZINE) 10 MG tablet Take 1 tablet (10 mg total) by mouth every 6 (six) hours as needed (Nausea or vomiting). 08/05/17  Yes [name], MD    Allergies: No Known Allergies  Past Medical History: Past Medical History:  Diagnosis Date  . Anxiety   . Cancer [facility])    right breast   . Family history of breast cancer   . Family history of colon cancer   . GERD (gastroesophageal reflux disease)   . Headache    couple of migraines in the past  . History  of kidney stones   . Vitamin D deficiency     Past Surgical History:  Procedure Laterality Date  . egg donation    . kidney stone removal    . MASTECTOMY W/ SENTINEL NODE BIOPSY Bilateral 07/22/2017   Procedure: RIGHT TOTAL MASTECTOMY WITH RIGHT AXILLARY SENTINEL LYMPH NODE BIOPSY, LEFT PROPHYLACTIC MASTECTOMY;  Surgeon: [name], MD;  Location: [place];  Service: General;  Laterality: Bilateral;  . PORTACATH PLACEMENT Right 07/22/2017   Procedure: INSERTION PORT-A-CATH WITH  [name];  Surgeon: [name], MD;  Location: [place];  Service: General;  Laterality: Right;    Social History:  Social History   Social History  . Marital status: Single    Spouse name: N/A  . Number of children: N/A  . Years of education: N/A   Occupational History  . Not on file.   Social History Main Topics  . Smoking status: Never Smoker  . Smokeless tobacco: Former Systems developer  . Alcohol use Yes     Comment: once a year  . Drug use: No  . Sexual activity: Yes   Other Topics Concern  . Not on file   Social History Narrative  . No narrative on file     Living Situation: lives with her husband Activity Level: usually independent with daily activities   Family History:  Family History  Problem Relation Age of Onset  . Breast cancer Paternal Grandmother 68  . Colon cancer Paternal Grandmother 8  . Breast cancer Mother 44  . Prostate cancer Paternal Grandfather        dx in his 79s  . Leukemia Paternal Grandfather   . Lung cancer Maternal Uncle        heavy smoker  . Other Paternal Uncle        farm accident  . Stroke Maternal Grandfather      Review of Systems - History obtained from the patient General ROS: positive for  - chills, fatigue and fever Psychological ROS: negative Ophthalmic ROS: negative ENT ROS: positive for - nasal discharge Allergy and Immunology ROS: negative Hematological and Lymphatic ROS: negative Endocrine ROS: negative Breast ROS: as in hpi Respiratory ROS: no cough, shortness of breath, or wheezing Cardiovascular ROS: no chest pain or dyspnea on exertion Gastrointestinal ROS: as in hpi Genito-Urinary ROS: no dysuria, trouble voiding, or hematuria Musculoskeletal ROS: negative Neurological ROS: no TIA or stroke symptoms Dermatological ROS: as in hpi  Physical Examination  Vitals:   09/04/17 1632 09/04/17 1652  BP: 128/84   Pulse: (!) 11   Resp: 16   Temp: 99.5 F (37.5 C)   TempSrc: Oral   SpO2: 100%   Weight:  71.7 kg  (158 lb)  Height:  5\' 8"  (1.727 m)    BP 128/84 (BP Location: Left Arm)   Pulse (!) 11   Temp 99.5 F (37.5 C) (Oral)   Resp 16   Ht 5\' 8"  (1.727 m)   Wt 71.7 kg (158 lb)   LMP 08/24/2017 (Approximate)   SpO2 100%   BMI 24.02 kg/m   General appearance: alert, cooperative, appears stated age and no distress Head: Normocephalic, without obvious abnormality, atraumatic Eyes: conjunctivae/corneas clear. PERRL, EOM's intact.  Nose: boggy nasal mucosa noted, especially on the right Throat: dry mucous membranes without any oral lesions Neck: no adenopathy, no carotid bruit, no JVD, supple, symmetrical, trachea midline and thyroid not enlarged, symmetric, no tenderness/mass/nodules Resp: clear to auscultation bilaterally Cardio: S1, S2 is tachycardic, regular. No S3, S4. No  rubs, murmurs, or bruit. No pedal edema GI: abdomen is soft. Tender over the suprapubic area without any rebound, rigidity or guarding. No masses or organomegaly. Bowel sounds present. Extremities: extremities normal, atraumatic, no cyanosis or edema Pulses: 2+ and symmetric Skin: mild erythematous rash noted around the Port-A-Cath site. Neurologic: no obvious focal neurological deficits.   Labs on Admission: I have personally reviewed following labs and imaging studies  CBC:  Recent Labs Lab 09/04/17 1325 09/04/17 1658  WBC 0.8* 0.7*  NEUTROABS 0.0* PENDING  HGB 12.4 11.6*  HCT 36.8 34.3*  MCV 91.5 89.6  PLT 154 798   Basic Metabolic Panel:  Recent Labs Lab 09/04/17 1326 09/04/17 1658  NA 134* 132*  K 4.1 3.8  CL  --  98*  CO2 25 23  GLUCOSE 137 110*  BUN 14.1 11  CREATININE 0.9 0.76  CALCIUM 8.9 8.4*   GFR: Estimated Creatinine Clearance: 95.2 mL/min (by C-G formula based on SCr of 0.76 mg/dL). Liver Function Tests:  Recent Labs Lab 09/04/17 1326 09/04/17 1658  AST 12 19  ALT 13 16  ALKPHOS 57 52  BILITOT 1.00 1.2  PROT 6.8 6.5  ALBUMIN 3.8 3.5     Radiological Exams on  Admission: Dg Chest Port 1 View  Result Date: 09/04/2017 CLINICAL DATA:  Breast cancer, first chemotherapy treatment 6 days ago. Fever, hypotensive and tachycardic. EXAM: PORTABLE CHEST 1 VIEW COMPARISON:  Chest radiograph July 22, 2017 FINDINGS: Cardiomediastinal silhouette is normal. No pleural effusions or focal consolidations. Trachea projects midline and there is no pneumothorax. Soft tissue planes and included osseous structures are non-suspicious. Single lumen RIGHT chest Port-A-Cath distal tip projects in mid superior vena cava. Bilateral mastectomies. Surgical clips RIGHT axilla. IMPRESSION: No acute cardiopulmonary process. Electronically Signed   By: Elon Alas M.D.   On: 09/04/2017 17:19    My interpretation of Electrocardiogram: sinus tachycardia at 117 bpm. Normal axis. Intervals normal. No concerning ST or T-wave changes.   Problem List  Principal Problem:   Sepsis ([place]) Active Problems:   Malignant neoplasm of upper-outer quadrant of right breast in female, estrogen receptor positive ([place])   Port-A-Cath in place   Neutropenic fever ([place])   Hyponatremia   Assessment: this is a 41 year old Caucasian female with a history of recently diagnosed breast cancer status post bilateral mastectomy, who started chemotherapy just about a week ago on October 12, who presents with fever. She has neutropenia. No clear source of infection is identified. She has a rash around her Port-A-Cath site which she attributes to tape and local medication. Does not appear to be cellulitis. UA is still pending. She did have episodes of loose stool yesterday but none today so far.  Plan: #1 Sepsis/neutropenic fever: etiology unclear so far. No clear source of infection. Monitor the skin around the port-a-cath. Blood cultures have been sent. Lactic acid level noted to be mildly elevated. This will be repeated. Check procalcitonin. Sepsis protocol has been initiated. Influenza PCR. Monitor on  telemetry. Treat her with vancomycin and Zosyn for now. She has received Granix today. She'll be ordered daily doses from tomorrow. Monitor her neutrophil counts daily. Follow up on UA. Even though she did have a few loose stools yesterday, GI source is thought to be less likely. Stool studies have been ordered. Her abdomen was mildly tender to palpation. However, she mentions that she has to urinate. If her symptoms do not improve or get worse, we may have to image her abdomen.  #2 history of  breast cancer status post bilateral mastectomy: She had her surgery in September. First cycle of chemotherapy started on October 12 when she received Taxotere, carboplatin, Herceptin, and Perjeta. She also received dexamethasone along with his chemotherapy. She'll be given stress dose steroids with hydrocortisone. Her oncologist is apparently out of town.  #3 hyponatremia: Most likely due to hypovolemia. She'll be given IV fluids. Repeat labs tomorrow.   DVT Prophylaxis: Lovenox Code Status: full code Family Communication: discussed with the patient and her husband  Consults called: none   Severity of Illness: The appropriate patient status for this patient is INPATIENT. Inpatient status is judged to be reasonable and necessary in order to provide the required intensity of service to ensure the patient's safety. The patient's presenting symptoms, physical exam findings, and initial radiographic and laboratory data in the context of their chronic comorbidities is felt to place them at high risk for further clinical deterioration. Furthermore, it is not anticipated that the patient will be medically stable for discharge from the hospital within 2 midnights of admission. The following factors support the patient status of inpatient.   " The patient's presenting symptoms include fever, fatigue. " The worrisome physical exam findings include tachycardia, borderline low blood pressure. " The initial radiographic and  laboratory data are worrisome because of elevated lactic acid level, neutropenia. " The chronic co-morbidities include breast cancer.   * I certify that at the point of admission it is my clinical judgment that the patient will require inpatient hospital care spanning beyond 2 midnights from the point of admission due to high intensity of service, high risk for further deterioration and high frequency of surveillance required.*  Further management decisions will depend on results of further testing and patient's response to treatment.   [facility]  [facility] Pager [phone]  If 7PM-7AM, please contact night-coverage [email or web address] Password [email or web address]  09/04/2017, 6:09 PM

## 2017-09-04 NOTE — ED Triage Notes (Addendum)
Pt comes from cancer center with complaints of hypotension, tachycardia, and fever.  Fever at cancer center was 100.8, HR 127.  Blood cultures done from port access there.  1 L of fluid given in route.  Restricted on the right.  Breast cancer patient. A&O x4.

## 2017-09-04 NOTE — Progress Notes (Addendum)
Sheila [name]  [phone] Fax:([phone]) [phone]  Clinic Follow up Note   Patient Care Team: [name], MD as PCP - General (Family Medicine) [name] (Family Medicine) [name], MD as Consulting Physician (General Surgery) [name], MD as Consulting Physician (Hematology) Kyung Rudd, MD as Consulting Physician (Radiation Oncology) 09/04/2017  SUMMARY OF ONCOLOGIC HISTORY: Oncology History    Cancer Staging Malignant neoplasm of upper-outer quadrant of right breast in female, estrogen receptor positive ([place]) Staging form: Breast, AJCC 8th Edition - Clinical stage from 06/11/2017: Stage IB (cT3, cN0, cM0, G2, ER: Positive, PR: Positive, HER2: Positive) - Signed by [name], MD on 06/16/2017 - Pathologic stage from 07/22/2017: Stage IB (pT3(m), pN2a(sn), cM0, G2, ER: Positive, PR: Positive, HER2: Positive) - Signed by Sheila Feeling, NP on 08/04/2017      Malignant neoplasm of upper-outer quadrant of right breast in female, estrogen receptor positive ([place])   06/11/2017 Initial Biopsy    Diagnosis 06/11/17 Breast, right, needle core biopsy, 11:00 o'clock - INVASIVE DUCTAL CARCINOMA, G2      06/11/2017 Mammogram    [name] and MM Diagnostic Breast Tomo Bilateral 06/11/17 IMPRESSION: 1. Highly suspicious mass within the retroareolar right breast, extending from the 8:00 to 11:00 axes, anterior to posterior depth, extending anteriorly to the nipple with associated nipple retraction, measuring at least 5.5 cm by ultrasound  2. No sonographic evidence of metastatic lymphadenopathy in the right axilla. 3. No evidence of malignancy within the left breast.       06/11/2017 Initial Diagnosis    Malignant neoplasm of upper-outer quadrant of right breast in female, estrogen receptor positive ([place])      06/11/2017 Receptors her2    Estrogen Receptor: 100%, POSITIVE, STRONG STAINING INTENSITY Progesterone Receptor: 100%, POSITIVE, STRONG  STAINING INTENSITY Proliferation Marker Ki67: 12%Proliferation Marker Ki67: 12%  HER2 - **POSITIVE** RATIO OF HER2/CEP17 SIGNALS 2.55 AVERAGE HER2 COPY NUMBER PER CELL 4.85      06/25/2017 Genetic Testing    Negative genetic testing on the 9 gene STAT panel.  The STAT Breast cancer panel offered by [facility] includes sequencing and rearrangement analysis for the following 9 genes:  ATM, BRCA1, BRCA2, CDH1, CHEK2, PALB2, PTEN, STK11 and TP53.   The report date is June 25, 2017.  Negative genetic testing on the common hereditary cancer panel.  The Hereditary Gene Panel offered by [facility] includes sequencing and/or deletion duplication testing of the following 46 genes: APC, ATM, AXIN2, BARD1, BMPR1A, BRCA1, BRCA2, BRIP1, CDH1, CDKN2A (p14ARF), CDKN2A (p16INK4a), CHEK2, CTNNA1, DICER1, EPCAM (Deletion/duplication testing only), GREM1 (promoter region deletion/duplication testing only), KIT, MEN1, MLH1, MSH2, MSH3, MSH6, MUTYH, NBN, NF1, NHTL1, PALB2, PDGFRA, PMS2, POLD1, POLE, PTEN, RAD50, RAD51C, RAD51D, SDHB, SDHC, SDHD, SMAD4, SMARCA4. STK11, TP53, TSC1, TSC2, and VHL.  The following genes were evaluated for sequence changes only: SDHA and HOXB13 c.251G>A variant only.  The report date is June 25, 2017.       07/02/2017 Imaging    CT cAP 07/02/17 IMPRESSION: 1. Subareolar right breast mass. No compelling findings of nodal or metastatic involvement. 2. Old granulomatous disease. 3. There is a 7 mm enhancing or hyperdense focus posteriorly in segment 7 of the liver in the subcapsular region. By virtue of its small size this lesion is technically nonspecific although statistically likely to be a small benign lesions such as flash filling hemangioma. This may warrant surveillance. 4.  Prominent stool throughout the colon [name] constipation. 5. 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      07/02/2017 Imaging    Bone scan 07/02/17 IMPRESSION: Today' s  exam is negative. I ascribed the tiny focus of activity just proximal to the left antecubital region to injection site.      07/22/2017 Surgery    RIGHT TOTAL MASTECTOMY WITH RIGHT AXILLARY SENTINEL LYMPH NODE BIOPSY, LEFT PROPHYLACTIC MASTECTOM and INSERTION PORT-A-CATH WITH [name] by [name] and [facility]       07/22/2017 Pathology Results    Diagnosis 1. Breast, simple mastectomy, Left - FIBROADENOMA. - NO MALIGNANCY IDENTIFIED. 2. 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 - ADDITIONAL FOCUS OF LOBULAR CARCINOMA, GRADE 2, SPANNING 2.2 CM. - INTERMEDIATE GRADE DUCTAL CARCINOMA IN SITU. - INVASIVE CARCINOMA COMES TO WITHIN 0.2 TO 0.3 CM OF THE DEEP MARGIN, FOCALLY. - TUMOR FOCALLY INVOLVES EPIDERMIS. - LYMPHOVASCULAR INVASION OF DERMAL LYMPHATICS. - ONE OF ONE LYMPH NODES NEGATIVE FOR CARCINOMA (0/1). - SEE ONCOLOGY TABLE. 3. Lymph node, sentinel, biopsy, Right axillary - ONE LYMPH NODE WITH ISOLATED TUMOR CELLS (0/1). 4. Lymph node, sentinel, biopsy, Right - METASTATIC CARCINOMA IN ONE OF ONE LYMPH NODES (1/1). 5. Lymph node, sentinel, biopsy, Right - ONE OF ONE LYMPH NODES NEGATIVE FOR CARCINOMA (0/1). 6. Lymph node, sentinel, biopsy, Right - METASTATIC CARCINOMA IN ONE OF ONE LYMPH NODES (1/1). - EXTRACAPSULAR EXTENSION. 7. Lymph node, sentinel, biopsy, Right - METASTATIC CARCINOMA IN ONE OF ONE LYMPH NODES (1/1). - EXTRACAPSULAR EXTENSION. 8. Lymph node, sentinel, biopsy, Right - ONE OF ONE LYMPH NODES NEGATIVE FOR CARCINOMA (0/1). 9. Lymph node, sentinel, biopsy, Right - ONE OF ONE LYMPH NODES NEGATIVE FOR CARCINOMA (0/1). 10. Lymph node, sentinel, biopsy, Right - METASTATIC CARCINOMA IN ONE OF ONE LYMPH NODES (1/1). - EXTRACAPSULAR EXTENSION. 11. Lymph node, biopsy, Right axillary - ONE OF ONE LYMPH NODES NEGATIVE FOR CARCINOMA (0/1).        Chemotherapy    PENDING adjuvant TCHP every 3 weeks for 6  cycles starting 08/28/17, followed by maintenance Herceptin with or without Perjeta for 6-12 months        08/25/2017 Imaging    MRI abdomen done at [facility]:  Enhancing structure measing 5 mm is compatible with a flash filling hemangioma. No specific findings to suggest metastatic cancer      CURRENT THERAPY: Adjuvant chemotherapy TCHP every 3 weeks for 6 cycles beginning 08/28/2017  INTERVAL HISTORY: Sheila [name] returns today for follow up as scheduled following taxotere, carboplatin, herceptin, perjeta on 08/29/2017. She experienced abdominal and epigastric burning during chemotherapy infusion that was treated with IV Solu-Medrol, Pepcid, in GI cocktail in the infusion room with resolution of symptoms. She was able to complete all chemotherapy and was discharged home in stable condition. For 5 days after treatment she was constipated, she drank apple juice and took a stool softener and developed diarrhea 2 days ago, she had 3 episodes in 24 hours. Intermittent nausea controlled with PRN Zofran and Compazine, no vomiting. Over the last 2 days she has become lethargic and weak with intermittent chills, mild dyspnea on exertion, decreased po intake and mild bone pain. She has not had energy to stand in the shower. She has not noted a fever at home but has been taking Aleve and Tylenol twice a day for bone pain. She received 2 doses of granix 10/16 and 10/17 because insurance would not approve neulasta.  REVIEW OF SYSTEMS:   Constitutional: Denies abnormal weight loss (+) fever, 100.8 in  clinic (+) severe fatigue Eyes: Denies blurriness of vision Ears, nose, mouth, throat, and face: Denies mucositis or sore throat Respiratory: Denies cough or wheezes (+) mild dyspnea on exertion Cardiovascular: Denies palpitation, chest discomfort or lower extremity swelling Gastrointestinal:  Denies constipation, vomiting, heartburn or blood in stool (+) intermittent nausea (+) diarrhea, 3 episodes in 24 hours GU:  denies dysuria, urinary frequency, urinary urgency Skin: denies wound, bruises, animal scratches (+) abnormal skin rash near right chest PAC, does not itch Lymphatics: Denies new lymphadenopathy or easy bruising. Denies bleeding Neurological:Denies numbness or tingling (+) lethargy (+) generalized weakness Behavioral/Psych: Mood is stable, no new changes  Breasts: s/p bilateral mastectomy, Denies pain, swelling, or discharge at incisions bilaterally All other systems were reviewed with the patient and are negative.  MEDICAL HISTORY:  Past Medical History:  Diagnosis Date  . Anxiety   . Cancer [facility])    right breast   . Family history of breast cancer   . Family history of colon cancer   . GERD (gastroesophageal reflux disease)   . Headache    couple of migraines in the past  . History of kidney stones   . Vitamin D deficiency     SURGICAL HISTORY: Past Surgical History:  Procedure Laterality Date  . egg donation    . kidney stone removal    . MASTECTOMY W/ SENTINEL NODE BIOPSY Bilateral 07/22/2017   Procedure: RIGHT TOTAL MASTECTOMY WITH RIGHT AXILLARY SENTINEL LYMPH NODE BIOPSY, LEFT PROPHYLACTIC MASTECTOMY;  Surgeon: [name], MD;  Location: [place];  Service: General;  Laterality: Bilateral;  . PORTACATH PLACEMENT Right 07/22/2017   Procedure: INSERTION PORT-A-CATH WITH [name];  Surgeon: [name], MD;  Location: [place];  Service: General;  Laterality: Right;    I have reviewed the social history and family history with the patient and they are unchanged from previous note.  ALLERGIES:  has No Known Allergies.  MEDICATIONS:  Current Facility-Administered Medications  Medication Dose Route Frequency Provider Last Rate Last Dose  . 0.9 %  sodium chloride infusion   Intravenous Once Sheila Feeling, NP 500 mL/hr at 09/04/17 1518     Current Outpatient Prescriptions  Medication Sig Dispense Refill  . Cholecalciferol (VITAMIN D) 2000 units tablet Take 2,000 Units by  mouth daily.    Marland Kitchen dexamethasone (DECADRON) 4 MG tablet Take 2 tablets (8 mg total) by mouth 2 (two) times daily. Start the day before Taxotere. Then again the day after chemo for 3 days. 30 tablet 1  . lidocaine-prilocaine (EMLA) cream Apply to affected area once 30 g 3  . naproxen sodium (ANAPROX) 220 MG tablet Take 220 mg by mouth 2 (two) times daily with a meal.    . omeprazole (PRILOSEC) 20 MG capsule Take 20 mg by mouth at bedtime.     . prochlorperazine (COMPAZINE) 10 MG tablet Take 1 tablet (10 mg total) by mouth every 6 (six) hours as needed (Nausea or vomiting). 30 tablet 1  . calcium carbonate (TUMS - DOSED IN MG ELEMENTAL CALCIUM) 500 MG chewable tablet Chew 2 tablets by mouth as needed for indigestion or heartburn.     . diphenhydramine-acetaminophen (TYLENOL PM) 25-500 MG TABS tablet Take 2 tablets by mouth at bedtime. sleep    . fluticasone (FLONASE) 50 MCG/ACT nasal spray Place 1 spray into both nostrils daily.    . ondansetron (ZOFRAN) 8 MG tablet Take 1 tablet (8 mg total) by mouth 2 (two) times daily as needed for refractory nausea / vomiting. Start  on day 3 after chemo. (Patient not taking: Reported on 09/04/2017) 30 tablet 1   Facility-Administered Medications Ordered in Other Visits  Medication Dose Route Frequency Provider Last Rate Last Dose  . piperacillin-tazobactam (ZOSYN) IVPB 3.375 g  3.375 g Intravenous Once [name], MD   3.375 g at 09/04/17 1558  . vancomycin (VANCOCIN) 1,250 mg in sodium chloride 0.9 % 250 mL IVPB  1,250 mg Intravenous Once [name], MD        PHYSICAL EXAMINATION: ECOG PERFORMANCE STATUS: 3 - Symptomatic, >50% confined to bed  Vitals:   09/04/17 1500 09/04/17 1600  BP: 105/65 110/71  Pulse: (!) 127 (!) 115  Resp: 20 (!) 22  Temp: (!) 100.8 F (38.2 C) (!) 100.4 F (38 C)  SpO2: 100% 100%   BP 110/71 (BP Location: Left Arm, Patient Position: Supine)   Pulse (!) 115   Temp (!) 100.4 F (38 C) (Oral)   Resp (!) 22   Ht '5\' 8"'  (1.727  m)   SpO2 100%   GENERAL: lethargic, side-lying on exam table SKIN: facial flushing. skin color, texture, turgor are normal, acne like rash scattered over face and upper chest and back. Port-A-Cath insertion site without erythema, no drainage. Superficial abrasions involving the skin surrounding PAC suspicious for tape reaction.  EYES: normal, Conjunctiva are pink and non-injected, sclera clear OROPHARYNX:no exudate, no erythema and lips, buccal mucosa, and tongue normal. Dry mucous membranes NECK: supple, thyroid normal size, non-tender, without nodularity LYMPH:  no palpable cervical, supraclavicular, or axillary lymphadenopathy  LUNGS: clear to auscultation bilaterally with normal breathing effort. Mild tachypnea HEART: tachycardic. regular rhythm, S1 and S2 normal, no murmurs and no lower extremity edema ABDOMEN:abdomen soft, non-tender and normal bowel sounds. No palpable hepatosplenomegaly or masses Musculoskeletal:no cyanosis of digits and no clubbing  NEURO: awake & oriented x 3 with fluent speech, no focal motor/sensory deficits Breasts: s/p bilateral mastectomy, surgical incisions are well-healed; no wound dehiscence or drainage. Scattered surgical glue residue present to bilateral incisions.    LABORATORY DATA:  I have reviewed the data as listed CBC Latest Ref Rng & Units 09/04/2017 08/28/2017 07/15/2017  WBC 3.9 - 10.3 10e3/uL 0.8(LL) 8.5 4.9  Hemoglobin 11.6 - 15.9 g/dL 12.4 12.1 12.8  Hematocrit 34.8 - 46.6 % 36.8 36.7 39.2  Platelets 145 - 400 10e3/uL 154 197 194   ANC 0.0  CMP Latest Ref Rng & Units 09/04/2017 08/28/2017 07/15/2017  Glucose 70 - 140 mg/dl 137 151(H) 91  BUN 7.0 - 26.0 mg/dL 14.1 12.8 12  Creatinine 0.6 - 1.1 mg/dL 0.9 0.9 0.95  Sodium 136 - 145 mEq/L 134(L) 137 139  Potassium 3.5 - 5.1 mEq/L 4.1 4.0 4.6  Chloride 101 - 111 mmol/L - - 106  CO2 22 - 29 mEq/L 25 20(L) 25  Calcium 8.4 - 10.4 mg/dL 8.9 9.3 9.6  Total Protein 6.4 - 8.3 g/dL 6.8 7.2 -    Total Bilirubin 0.20 - 1.20 mg/dL 1.00 0.37 -  Alkaline Phos 40 - 150 U/L 57 64 -  AST 5 - 34 U/L 12 12 -  ALT 0 - 55 U/L 13 8 -    RADIOGRAPHIC STUDIES: I have personally reviewed the radiological images as listed and agreed with the findings in the report. No results found.   ASSESSMENT & PLAN: Sheila [name] is a 41 y.o. premenopausal female with a history of Vitamin D deficiency and high cholesterol and kidney stones, presented with a palpable right breast mass.   1.  Malignant neoplasm of upper-outer quadrant of right breast in female, mixed invasive ductal and lobular carcinoma, pT3(m)N2aM0, stage 1b, ductal carcinoma tripple positive, lobular carcinoma ER+/PR+/HER2-,  Grade 2 2. Genetics 3. Hyperglycemia 4. Social work, Insurance underwriter 5. Febrile neutropenia  Sheila [name] presents with neutropenic fever, tachycardia, tachypnea, hypotension, and lethargy. Tmax 100.8 in clinic. Peripheral and central blood cultures were obtained, PAC was accessed and NS IV bolus was initiated. Vanc and Zosyn were ordered. Zosyn and granix were given prior to transport to ED for code sepsis. I saw the patient with Dr. Benay Spice.   PLAN: -Transport to ED for further evaluation and management, admission pending  Orders Placed This Encounter  Procedures  . Culture, Blood    Standing Status:   Future    Number of Occurrences:   1    Standing Expiration Date:   09/04/2018    Order Specific Question:   Source    Answer:   draw peripheral  . Culture, Blood    Standing Status:   Future    Number of Occurrences:   1    Standing Expiration Date:   09/04/2018    Order Specific Question:   Source    Answer:   draw from [facility]  . piperacillin-tazobactam (ZOSYN) per pharmacy consult    Standing Status:   Standing    Number of Occurrences:   1    Order Specific Question:   Antibiotic Indication:    Answer:   Febrile Neutropenia  . vancomycin per pharmacy consult    Standing Status:   Standing    Number of  Occurrences:   1    Order Specific Question:   Indication:    Answer:   Febrile Neutropenia   All questions were answered. The patient knows to call the clinic with any problems, questions or concerns. No barriers to learning was detected.     Sheila Feeling, NP 09/04/17  This was a shared visit with Sheila [name]. Sheila [name] was interviewed and examined. She presents with severe neutropenia, fever, and hypotension following cycle 1 of adjuvant chemotherapy. She has oral mucositis. She will be admitted for evaluation and management of sepsis syndrome.  She received intravenous fluids and IV antibodies were initiated at the [facility] prior to transfer to the emergency room. The Oncology service will follow her while in the hospital. She received G-CSF at the [facility] and should continue daily G-CSF while hospitalized.  Sheila [name], M.D.

## 2017-09-04 NOTE — ED Notes (Signed)
Call report to Rodman Key [phone] @ [phone]

## 2017-09-04 NOTE — Progress Notes (Signed)
CRITICAL VALUE ALERT  Critical Value:  Lactic acid 3.3  Date & Time Notied:  2108/09/04/18  Provider Notified: Raliegh Ip Schorr  Orders Received/Actions taken: 574ml Bolus given

## 2017-09-04 NOTE — ED Notes (Signed)
Pt is also currently receiving zosyn.

## 2017-09-04 NOTE — ED Provider Notes (Signed)
[facility] DEPT Provider Note   CSN: [id number] Arrival date & time: 09/04/17  1618     History   Chief Complaint Chief Complaint  Patient presents with  . Hypotension  . Fever    HPI Sheila [name] is a 41 y.o. female.  HPI Patient has active breast cancer with bilateral mastectomy and current treatment with Taxotere, carboplatin, Herceptin and projected F.. She was at the cancer center today and found to have tachycardia, fever 100.8 in concern for sepsis. Patient was given a dose of Zosyn and vancomycin was initiated. 1 L fluids was initiated. She is referred to the emergency department for sepsis evaluation and admission. Patient reports she's had some general symptoms of fatigue and weakness. She had 3 episodes of diarrhea yesterday. Prior to that however she had been constipated for almost a week. No other focal symptoms except feeling of dryness and secretions in her throat. Past Medical History:  Diagnosis Date  . Anxiety   . Cancer [facility])    right breast   . Family history of breast cancer   . Family history of colon cancer   . GERD (gastroesophageal reflux disease)   . Headache    couple of migraines in the past  . History of kidney stones   . Vitamin D deficiency     Patient Active Problem List   Diagnosis Date Noted  . Port-A-Cath in place 08/28/2017  . Genetic testing 06/25/2017  . Family history of breast cancer   . Family history of colon cancer   . Malignant neoplasm of upper-outer quadrant of right breast in female, estrogen receptor positive ([name]) 06/16/2017    Past Surgical History:  Procedure Laterality Date  . egg donation    . kidney stone removal    . MASTECTOMY W/ SENTINEL NODE BIOPSY Bilateral 07/22/2017   Procedure: RIGHT TOTAL MASTECTOMY WITH RIGHT AXILLARY SENTINEL LYMPH NODE BIOPSY, LEFT PROPHYLACTIC MASTECTOMY;  Surgeon: [name], MD;  Location: [facility];  Service: General;  Laterality: Bilateral;    . PORTACATH PLACEMENT Right 07/22/2017   Procedure: INSERTION PORT-A-CATH WITH [name];  Surgeon: [name], MD;  Location: [facility];  Service: General;  Laterality: Right;    OB History    No data available       Home Medications    Prior to Admission medications   Medication Sig Start Date End Date Taking? Authorizing Provider  calcium carbonate (TUMS - DOSED IN MG ELEMENTAL CALCIUM) 500 MG chewable tablet Chew 2 tablets by mouth as needed for indigestion or heartburn.    Yes [provider]  Cholecalciferol (VITAMIN D) 2000 units tablet Take 2,000 Units by mouth daily.   Yes [provider]  dexamethasone (DECADRON) 4 MG tablet Take 2 tablets (8 mg total) by mouth 2 (two) times daily. Start the day before Taxotere. Then again the day after chemo for 3 days. 08/05/17  Yes [name], MD  diphenhydramine-acetaminophen (TYLENOL PM) 25-500 MG TABS tablet Take 2 tablets by mouth at bedtime. sleep   Yes [provider]  fluticasone (FLONASE) 50 MCG/ACT nasal spray Place 1 spray into both nostrils daily.   Yes [provider]  lidocaine-prilocaine (EMLA) cream Apply to affected area once 08/05/17  Yes [name], MD  naproxen sodium (ANAPROX) 220 MG tablet Take 220 mg by mouth 2 (two) times daily with a meal.   Yes [provider]  omeprazole (PRILOSEC) 20 MG capsule Take 20 mg by mouth at bedtime.  Yes [provider]  ondansetron (ZOFRAN) 8 MG tablet Take 1 tablet (8 mg total) by mouth 2 (two) times daily as needed for refractory nausea / vomiting. Start on day 3 after chemo. 08/05/17  Yes [name], MD  prochlorperazine (COMPAZINE) 10 MG tablet Take 1 tablet (10 mg total) by mouth every 6 (six) hours as needed (Nausea or vomiting). 08/05/17  Yes [name], MD    Family History Family History  Problem Relation Age of Onset  . Breast cancer Paternal Grandmother 62  . Colon cancer Paternal Grandmother 62  . Breast cancer Mother 89  .  Prostate cancer Paternal Grandfather        dx in his 53s  . Leukemia Paternal Grandfather   . Lung cancer Maternal Uncle        heavy smoker  . Other Paternal Uncle        farm accident  . Stroke Maternal Grandfather     Social History Social History  Substance Use Topics  . Smoking status: Never Smoker  . Smokeless tobacco: Former Systems developer  . Alcohol use Yes     Comment: once a year     Allergies   Patient has no known allergies.   Review of Systems Review of Systems 10 Systems reviewed and are negative for acute change except as noted in the HPI.   Physical Exam Updated Vital Signs BP 128/84 (BP Location: Left Arm)   Pulse (!) 11   Temp 99.5 F (37.5 C) (Oral)   Resp 16   Ht 5\' 8"  (1.727 m)   Wt 71.7 kg (158 lb)   LMP 08/24/2017 (Approximate)   SpO2 100%   BMI 24.02 kg/m   Physical Exam  Constitutional: She is oriented to person, place, and time. She appears well-developed and well-nourished. No distress.  HENT:  Head: Normocephalic and atraumatic.  Mouth/Throat: Oropharynx is clear and moist.  Eyes: Conjunctivae and EOM are normal.  Neck: Neck supple.  Cardiovascular: Normal rate and regular rhythm.   No murmur heard. Pulmonary/Chest: Effort normal and breath sounds normal. No respiratory distress.  Bilateral mastectomy incisions clean dry intact and healing well. No erythema or swelling.  Abdominal: Soft. There is tenderness.  Mild diffuse tenderness without guarding.  Musculoskeletal: Normal range of motion. She exhibits no edema or tenderness.  No calf Tenderness or peripheral edema.  Neurological: She is alert and oriented to person, place, and time. No cranial nerve deficit. She exhibits normal muscle tone. Coordination normal.  Skin: Skin is warm and dry.  Psychiatric: She has a normal mood and affect.  Nursing note and vitals reviewed.    ED Treatments / Results  Labs (all labs ordered are listed, but only abnormal results are displayed) Labs  Reviewed  CULTURE, BLOOD (ROUTINE X 2)  CULTURE, BLOOD (ROUTINE X 2)  COMPREHENSIVE METABOLIC PANEL  CBC WITH DIFFERENTIAL/PLATELET  URINALYSIS, ROUTINE W REFLEX MICROSCOPIC  I-STAT CG4 LACTIC ACID, ED    EKG  EKG Interpretation None       Radiology No results found.  Procedures Procedures (including critical care time) CRITICAL CARE Performed by: Charlesetta Shanks   Total critical care time: 30 minutes  Critical care time was exclusive of separately billable procedures and treating other patients.  Critical care was necessary to treat or prevent imminent or life-threatening deterioration.  Critical care was time spent personally by me on the following activities: development of treatment plan with patient and/or surrogate as well as nursing, discussions with consultants, evaluation of  patient's response to treatment, examination of patient, obtaining history from patient or surrogate, ordering and performing treatments and interventions, ordering and review of laboratory studies, ordering and review of radiographic studies, pulse oximetry and re-evaluation of patient's condition. Medications Ordered in ED Medications  sodium chloride 0.9 % bolus 1,000 mL (not administered)    And  sodium chloride 0.9 % bolus 1,000 mL (not administered)    And  sodium chloride 0.9 % bolus 250 mL (not administered)  vancomycin (VANCOCIN) IVPB 1000 mg/200 mL premix (not administered)     Initial Impression / Assessment and Plan / ED Course  I have reviewed the triage vital signs and the nursing notes.  Pertinent labs & imaging results that were available during my care of the patient were reviewed by me and considered in my medical decision making (see chart for details).     Consult tried hospitalist for admission.  Final Clinical Impressions(s) / ED Diagnoses   Final diagnoses:  Sepsis, due to unspecified organism ([place])  Neutropenic fever ([place])  Malignant neoplasm of breast in  female, estrogen receptor positive, unspecified laterality, unspecified site of breast ([place])  source unclear. Patient did start having some diarrhea yesterday after some prolonged constipation. Will also add stool specimen if patient has diarrheal stool. Sepsis protocol initiated.  New Prescriptions New Prescriptions   No medications on file     Charlesetta Shanks, MD 09/04/17 [phone]

## 2017-09-04 NOTE — ED Notes (Signed)
Bed: WA20 Expected date:  Expected time:  Means of arrival:  Comments: Pt from [place]

## 2017-09-04 NOTE — Progress Notes (Signed)
Pharmacy Antibiotic Note  Sheila [name] is a 41 y.o. female admitted on 09/04/2017 with sepsis/neutropenic fever.  Pharmacy has been consulted for vancomycin and zosyn dosing. Patient is noted to be neutropenic with granix given at [facility] today. Vancomycin and zosyn doses x 1 given at [facility] and ED  Plan:  Vancomycin 750mg  IV q8h  Zosyn 3.375gm IV q8h over 4h infusion  Consider change zosyn to cefepime 2gm q8h to reduce risk of nephrotoxicity  Daily SCr for increased risk of AKI with above regimen  Follow-up culture results  Height: 5\' 6"  (167.6 cm) Weight: 158 lb 11.7 oz (72 kg) IBW/kg (Calculated) : 59.3  Temp (24hrs), Avg:100.5 F (38.1 C), Min:99.5 F (37.5 C), Max:102.2 F (39 C)   Recent Labs Lab 09/04/17 1325 09/04/17 1326 09/04/17 1658 09/04/17 1710  WBC 0.8*  --  0.7*  --   CREATININE  --  0.9 0.76  --   LATICACIDVEN  --   --   --  2.34*    Estimated Creatinine Clearance: 96 mL/min (by C-G formula based on SCr of 0.76 mg/dL).    No Known Allergies  Antimicrobials this admission: 10/18 vancomycin >>  10/18 zosyn >>   Dose adjustments this admission:  Microbiology results: 10/18 BCx:  10/18 influenza panel: 10/18 C. Difficile: 10/18 GI panel:   Thank you for allowing pharmacy to be a part of this patient's care.  Doreene Eland, PharmD, BCPS.   Pager: [phone] 09/04/2017 8:43 PM

## 2017-09-04 NOTE — ED Notes (Signed)
Date and time results received: 09/04/17 5:49 PM  (use smartphrase ".now" to insert current time)  Test: WBC Critical Value: 0.7  Name of Provider Notified: Erasmo Downer RN   Orders Received? Or Actions Taken?: Made Primary RN aware as well Dr Johnney Killian

## 2017-09-04 NOTE — Telephone Encounter (Signed)
pts vitals rechecked and documented by this nurse.BP 110/71, hr 115, febrile @ 100.4,  O2 100% on RA. Pt is A&Ox4.  granix 480 given in left arm. Zosyn 3.37g hung and infusing with normal saline. Pt transferred to ED via wheelchair by this nurse and Lavella Lemons, RN. Vancomycin also hanging on IV pole but not Infusing at this time. ED nurse made aware. Report given to ED nurse.

## 2017-09-04 NOTE — Telephone Encounter (Signed)
IV fluids started on pt at 564ml/hr. Blood cultures drawn by this nurse via port access. pts lying supine at this time with husband at bedside. Gown and warm blanket provided for patient. Call light in patients reach. Will continue to monitor patient.

## 2017-09-05 ENCOUNTER — Inpatient Hospital Stay (HOSPITAL_COMMUNITY): Payer: PRIVATE HEALTH INSURANCE

## 2017-09-05 ENCOUNTER — Encounter (HOSPITAL_COMMUNITY): Payer: Self-pay | Admitting: Radiology

## 2017-09-05 DIAGNOSIS — C50411 Malignant neoplasm of upper-outer quadrant of right female breast: Secondary | ICD-10-CM

## 2017-09-05 DIAGNOSIS — R5081 Fever presenting with conditions classified elsewhere: Secondary | ICD-10-CM

## 2017-09-05 DIAGNOSIS — A419 Sepsis, unspecified organism: Principal | ICD-10-CM

## 2017-09-05 DIAGNOSIS — D701 Agranulocytosis secondary to cancer chemotherapy: Secondary | ICD-10-CM

## 2017-09-05 DIAGNOSIS — C50919 Malignant neoplasm of unspecified site of unspecified female breast: Secondary | ICD-10-CM

## 2017-09-05 DIAGNOSIS — D6959 Other secondary thrombocytopenia: Secondary | ICD-10-CM

## 2017-09-05 LAB — C DIFFICILE QUICK SCREEN W PCR REFLEX
C DIFFICILE (CDIFF) INTERP: NOT DETECTED
C DIFFICILE (CDIFF) TOXIN: NEGATIVE
C DIFFICLE (CDIFF) ANTIGEN: NEGATIVE

## 2017-09-05 LAB — CBC WITH DIFFERENTIAL/PLATELET
BASOS ABS: 0 10*3/uL (ref 0.0–0.1)
Basophils Relative: 0 %
EOS PCT: 0 %
Eosinophils Absolute: 0 10*3/uL (ref 0.0–0.7)
HEMATOCRIT: 29.4 % — AB (ref 36.0–46.0)
Hemoglobin: 10 g/dL — ABNORMAL LOW (ref 12.0–15.0)
LYMPHS ABS: 0.2 10*3/uL — AB (ref 0.7–4.0)
Lymphocytes Relative: 77 %
MCH: 30.8 pg (ref 26.0–34.0)
MCHC: 34 g/dL (ref 30.0–36.0)
MCV: 90.5 fL (ref 78.0–100.0)
MONO ABS: 0.1 10*3/uL (ref 0.1–1.0)
MONOS PCT: 23 %
NEUTROS PCT: 0 %
Neutro Abs: 0 10*3/uL — ABNORMAL LOW (ref 1.7–7.7)
PLATELETS: 148 10*3/uL — AB (ref 150–400)
RBC: 3.25 MIL/uL — AB (ref 3.87–5.11)
RDW: 12.2 % (ref 11.5–15.5)
WBC: 0.3 10*3/uL — CL (ref 4.0–10.5)

## 2017-09-05 LAB — COMPREHENSIVE METABOLIC PANEL
ALT: 21 U/L (ref 14–54)
AST: 24 U/L (ref 15–41)
Albumin: 2.9 g/dL — ABNORMAL LOW (ref 3.5–5.0)
Alkaline Phosphatase: 43 U/L (ref 38–126)
Anion gap: 8 (ref 5–15)
BUN: 9 mg/dL (ref 6–20)
CHLORIDE: 103 mmol/L (ref 101–111)
CO2: 23 mmol/L (ref 22–32)
Calcium: 7.5 mg/dL — ABNORMAL LOW (ref 8.9–10.3)
Creatinine, Ser: 0.83 mg/dL (ref 0.44–1.00)
Glucose, Bld: 159 mg/dL — ABNORMAL HIGH (ref 65–99)
POTASSIUM: 3.9 mmol/L (ref 3.5–5.1)
SODIUM: 134 mmol/L — AB (ref 135–145)
Total Bilirubin: 0.9 mg/dL (ref 0.3–1.2)
Total Protein: 5.7 g/dL — ABNORMAL LOW (ref 6.5–8.1)

## 2017-09-05 LAB — LACTIC ACID, PLASMA
LACTIC ACID, VENOUS: 3.2 mmol/L — AB (ref 0.5–1.9)
LACTIC ACID, VENOUS: 2.4 mmol/L — AB (ref 0.5–1.9)

## 2017-09-05 LAB — GASTROINTESTINAL PANEL BY PCR, STOOL (REPLACES STOOL CULTURE)

## 2017-09-05 LAB — HIV ANTIBODY (ROUTINE TESTING W REFLEX): HIV SCREEN 4TH GENERATION: NONREACTIVE

## 2017-09-05 MED ORDER — IOPAMIDOL (ISOVUE-300) INJECTION 61%: 15.0000 mL | Freq: Once | INTRAVENOUS | Status: DC | PRN

## 2017-09-05 MED ORDER — MORPHINE SULFATE (PF) 4 MG/ML IV SOLN
2.0000 mg | INTRAVENOUS | Status: DC | PRN
  Administered 2017-09-05 – 2017-09-06 (×6): 2 mg via INTRAVENOUS
  Filled 2017-09-05 (×6): qty 1

## 2017-09-05 MED ORDER — IOPAMIDOL (ISOVUE-300) INJECTION 61%
INTRAVENOUS | Status: AC
  Filled 2017-09-05: qty 100

## 2017-09-05 MED ORDER — SODIUM CHLORIDE 0.9 % IV BOLUS (SEPSIS): 1000.0000 mL | Freq: Once | INTRAVENOUS | Status: DC

## 2017-09-05 MED ORDER — IOPAMIDOL (ISOVUE-300) INJECTION 61%
100.0000 mL | Freq: Once | INTRAVENOUS | Status: AC | PRN
  Administered 2017-09-05: 100 mL via INTRAVENOUS

## 2017-09-05 MED ORDER — SODIUM CHLORIDE 0.9 % IV BOLUS (SEPSIS)
1000.0000 mL | Freq: Once | INTRAVENOUS | Status: AC
  Administered 2017-09-05: 1000 mL via INTRAVENOUS

## 2017-09-05 MED ORDER — SODIUM CHLORIDE 0.9 % IV BOLUS (SEPSIS)
500.0000 mL | Freq: Once | INTRAVENOUS | Status: AC
  Administered 2017-09-05: 500 mL via INTRAVENOUS

## 2017-09-05 MED ORDER — GI COCKTAIL ~~LOC~~
30.0000 mL | Freq: Once | ORAL | Status: AC
  Administered 2017-09-05: 30 mL via ORAL
  Filled 2017-09-05: qty 30

## 2017-09-05 MED ORDER — IOPAMIDOL (ISOVUE-300) INJECTION 61%: 100.0000 mL | Freq: Once | INTRAVENOUS | Status: DC | PRN

## 2017-09-05 MED ORDER — IOPAMIDOL (ISOVUE-300) INJECTION 61%
INTRAVENOUS | Status: AC
  Filled 2017-09-05: qty 30

## 2017-09-05 NOTE — Progress Notes (Signed)
[facility] PROGRESS NOTE  Sheila [name] TKP:[id number] DOB: 12/21/1975 DOA: 09/04/2017  PCP: [name], MD  Brief History/Interval Summary: 41 year old Caucasian female with a past medical history of breast cancer status post bilateral mastectomy, who started chemotherapy with cycle on October 12, presented with fever, weakness, dizziness.She was found to be febrile. She was found to be neutropenic. She was hospitalized for further management.  Reason for Visit: Neutropenic fever, sepsis  Consultants: none  Procedures: none  Antibiotics: Vancomycin and Zosyn  Subjective/Interval History: Patient states that she is feeling better this morning. Not as fatigued as yesterday. She did have some loose stools yesterday but they are starting to be more formed this morning. Denies any nausea, vomiting. Denies any discomfort with urinating.  ROS: denies any nausea, vomiting  Objective:  Vital Signs  Vitals:   09/04/17 2340 09/05/17 0406 09/05/17 0657 09/05/17 1259  BP:  (!) 106/45  110/67  Pulse:  (!) 129  (!) 104  Resp:  20  17  Temp: 99.1 F (37.3 C) 98.5 F (36.9 C) 99.9 F (37.7 C) 98.5 F (36.9 C)  TempSrc: Oral Oral Oral Oral  SpO2:  100%  100%  Weight:      Height:        Intake/Output Summary (Last 24 hours) at 09/05/17 1327 Last data filed at 09/05/17 1302  Gross per 24 hour  Intake          4408.33 ml  Output             1300 ml  Net          3108.33 ml   Filed Weights   09/04/17 1652 09/04/17 2021  Weight: 71.7 kg (158 lb) 72 kg (158 lb 11.7 oz)    General appearance: alert, cooperative, appears stated age and no distress Head: Normocephalic, without obvious abnormality, atraumatic Resp: clear to auscultation bilaterally Cardio: regular rate and rhythm, S1, S2 normal, no murmur, click, rub or gallop GI: abdomen is soft. However, there is tenderness appreciated again in the lower abdomen, mostly towards the right side. No rebound,  rigidity or guarding. No masses or organomegaly. Extremities: extremities normal, atraumatic, no cyanosis or edema Neurologic: awake and alert. Oriented 3. No focal neurological deficits.  Lab Results:  Data Reviewed: I have personally reviewed following labs and imaging studies  CBC:  Recent Labs Lab 09/04/17 1325 09/04/17 1658 09/05/17 0437  WBC 0.8* 0.7* 0.3*  NEUTROABS 0.0* 0.0* 0.0*  HGB 12.4 11.6* 10.0*  HCT 36.8 34.3* 29.4*  MCV 91.5 89.6 90.5  PLT 154 165 148*    Basic Metabolic Panel:  Recent Labs Lab 09/04/17 1326 09/04/17 1658 09/05/17 0437  NA 134* 132* 134*  K 4.1 3.8 3.9  CL  --  98* 103  CO2 25 23 23   GLUCOSE 137 110* 159*  BUN 14.1 11 9   CREATININE 0.9 0.76 0.83  CALCIUM 8.9 8.4* 7.5*    GFR: Estimated Creatinine Clearance: 92.5 mL/min (by C-G formula based on SCr of 0.83 mg/dL).  Liver Function Tests:  Recent Labs Lab 09/04/17 1326 09/04/17 1658 09/05/17 0437  AST 12 19 24   ALT 13 16 21   ALKPHOS 57 52 43  BILITOT 1.00 1.2 0.9  PROT 6.8 6.5 5.7*  ALBUMIN 3.8 3.5 2.9*    Coagulation Profile:  Recent Labs Lab 09/04/17 2111  INR 1.10     Recent Results (from the past 240 hour(s))  C difficile quick scan w PCR reflex  Status: None   Collection Time: 09/05/17  3:55 AM  Result Value Ref Range Status   C Diff antigen NEGATIVE NEGATIVE Final   C Diff toxin NEGATIVE NEGATIVE Final   C Diff interpretation No C. difficile detected.  Final      Radiology Studies: Dg Chest Port 1 View  Result Date: 09/04/2017 CLINICAL DATA:  Breast cancer, first chemotherapy treatment 6 days ago. Fever, hypotensive and tachycardic. EXAM: PORTABLE CHEST 1 VIEW COMPARISON:  Chest radiograph July 22, 2017 FINDINGS: Cardiomediastinal silhouette is normal. No pleural effusions or focal consolidations. Trachea projects midline and there is no pneumothorax. Soft tissue planes and included osseous structures are non-suspicious. Single lumen RIGHT  chest Port-A-Cath distal tip projects in mid superior vena cava. Bilateral mastectomies. Surgical clips RIGHT axilla. IMPRESSION: No acute cardiopulmonary process. Electronically Signed   By: Elon Alas M.D.   On: 09/04/2017 17:19     Medications:  Scheduled: . enoxaparin (LOVENOX) injection  40 mg Subcutaneous Q24H  . feeding supplement (ENSURE ENLIVE)  237 mL Oral BID BM  . fluticasone  2 spray Each Nare Daily  . hydrocortisone sod succinate (SOLU-CORTEF) inj  100 mg Intravenous Q8H  . iopamidol      . Tbo-filgastrim (GRANIX) SQ  480 mcg Subcutaneous q1800   Continuous: . sodium chloride 125 mL/hr at 09/05/17 0745  . piperacillin-tazobactam (ZOSYN)  IV 3.375 g (09/05/17 1302)  . vancomycin Stopped (09/05/17 0844)   BPZ:WCHENIDPOEUMP **OR** acetaminophen, iopamidol, ondansetron **OR** ondansetron (ZOFRAN) IV, sodium chloride flush  Assessment/Plan:  Principal Problem:   Sepsis ([place]) Active Problems:   Malignant neoplasm of upper-outer quadrant of right breast in female, estrogen receptor positive ([place])   Port-A-Cath in place   Neutropenic fever ([place])   Hyponatremia    Sepsis/neutropenic fever  Etiology/source of infection unclear so far. The erythematous area around her Port-A-Cath is stable and heart to be due to reaction to tape. Blood cultures are pending. Lactic acid level has improved after aggressive IV hydration. Overall, patient does feel better. However, she continues to have abdominal tenderness. She did have episodes of loose stools. C. Difficile was negative. GI pathogen panel is pending. Proceed with CT scan of the abdomen and pelvis. Continue vancomycin and Zosyn for now. Heart rate and blood pressure has improved. Pro calcitonin level was less than 0.1, which is reassuring. WBC remains low with neutrophil count of 0. Continue filgrastim. Influenza PCR negative. HIV nonreactive.  History of breast cancer status post bilateral mastectomy. Followed by oncology.  She started her chemotherapy on October 12. She's had 1 cycle so far with Taxotere, carboplatin, Herceptin, and perjeta. She also received dexamethasone along with this chemotherapy. She's currently on stress dose steroids with hydrocortisone.  Hyponatremia Sodium levels stable. Continue to monitor. Continue IV hydration.  Normocytic anemia. No evidence for overt bleeding. Continue to monitor.   DVT Prophylaxis: Lovenox    Code Status: full code  Family Communication: discussed with the patient  Disposition Plan: management as outlined above.    LOS: 1 day   [facility] Pager [phone] 09/05/2017, 1:27 PM  If 7PM-7AM, please contact night-coverage at [email or web address], password [facility]

## 2017-09-05 NOTE — Progress Notes (Addendum)
Initial Nutrition Assessment  DOCUMENTATION CODES:   Not applicable  INTERVENTION:   Ensure Enlive po BID, each supplement provides 350 kcal and 20 grams of protein  NUTRITION DIAGNOSIS:   Inadequate oral intake related to cancer and cancer related treatments as evidenced by per patient/family report.  GOAL:   Patient will meet greater than or equal to 90% of their needs  MONITOR:   PO intake, Supplement acceptance, Weight trends, Labs  REASON FOR ASSESSMENT:   Malnutrition Screening Tool    ASSESSMENT:   Pt with PMH significant for breast cancer s/p bilateral mastectomy, GERD, and anxiety.Pt started first cycle of chemotherapy 10/12. Presents this admission with sepsis/neuotrpenic fever.   Spoke with pt at bedside. Reports having a loss in appetite for the last three days related to extreme nausea. States she typically consumes 2-3 meals per day with snacks, but in the last three days this has dropped to consuming bites at each meal. Pt does use Boost supplementation at home. Pt complains of texture changes upon eating, and that it feels like "plastic wrap is lining her mouth" making it hard to chew/swallow. Ptr amendable to Ensure this stay.   Records indicate pt has maintained her weight of 155-160 lb for > 1 year.   Nutrition-Focused physical exam completed. Findings are no fat depletion, no muscle depletion, and no edema.   Dicussed possible decrease in appetite with ongoing chemotherapy. Provided information on different protein supplements she can use post discharge.   Medications reviewed and include: solu-cortef, NS @ 125 ml/hr, IV abx Labs reviewed: Na 134 (L)   Diet Order:  Diet regular Room service appropriate? Yes; Fluid consistency: Thin  Skin:  Reviewed, no issues  Last BM:  09/04/17  Height:   Ht Readings from Last 1 Encounters:  09/04/17 5\' 6"  (1.676 m)    Weight:   Wt Readings from Last 1 Encounters:  09/04/17 158 lb 11.7 oz (72 kg)     Ideal Body Weight:  59.1 kg  BMI:  Body mass index is 25.62 kg/m.  Estimated Nutritional Needs:   Kcal:  1900-2100 kcal (26-29 kcal/kg)  Protein:  85-95 grams (1.2-1.3 g/kg)  Fluid:  >1.9 L/day  EDUCATION NEEDS:   Education needs addressed  [place], LDN Clinical Nutrition Pager # [phone]

## 2017-09-05 NOTE — Progress Notes (Signed)
CRITICAL VALUE ALERT  Critical Value:  Lactic acid 2.4  Date & Time Notied:  09/05/17 @ 1031  Provider Notified: Curly Rim

## 2017-09-05 NOTE — Progress Notes (Signed)
CRITICAL VALUE ALERT  Critical Value:  Lactic acid 3.2  Date & Time Notied:  0430 on 09/05/17  Provider Notified: K. Schorr  Orders Received/Actions taken: 1L bolus 556ml/hr

## 2017-09-05 NOTE — Progress Notes (Signed)
Patient intermittently dizzy during ambulation. HR sustains into the 160s with ambulation and 120s-130s lying. Patient is A&O x4. Multiple fluid boluses given. MD on call K. Schorr aware. Will continue to monitor patient closely.

## 2017-09-05 NOTE — Progress Notes (Signed)
Marland Kitchen    HEMATOLOGY/ONCOLOGY CONSULTATION NOTE  Date of Service: 09/05/2017  Patient Care Team: [name], MD as PCP - General (Family Medicine) [name] (Family Medicine) [name], MD as Consulting Physician (General Surgery) [name], MD as Consulting Physician (Hematology) Kyung Rudd, MD as Consulting Physician (Radiation Oncology)  CHIEF COMPLAINTS/PURPOSE OF CONSULTATION:  Neutropenic Fevers  HISTORY OF PRESENTING ILLNESS:   Sheila [name] is a wonderful 41 y.o. female who has been referred to [name] by Dr Bonnielee Haff, MD  for evaluation and management of neutropenic fevers.  Patient has a history of locally advanced (Stage IIIA) triple positive breast cancer status post right-sided mastectomy and axillary lymph node dissection and prophylactic left-sided mastectomy.  Follows with Dr. Burr Medico for her oncologic cares.  Details of evaluation and management of her breast cancer as per oncologic history below.  Patient was recently started on adjuvant TCHP treatment on 08/29/2017.  Apparently her insurance company did not approve Neulasta and she received 3 days of Granix on day 5 6 and 7 of her cycle 1.  She was seen in the symptom management clinic for a fever of 100.8 and was noted to have tachycardia and soft blood pressures and was noted to have significant neutropenia with an elevated lactate level and was admitted to the hospital for neutropenic fevers with sepsis. Her WBC count on admission was 0.7k with an Schaller of 0.  Hemoglobin 11.6 with a platelet count of 165.  Her WBC count has dropped to 0.3 with an ANC of 0 hemoglobin of 10 and platelet count of 148k today.  Blood cultures have been sent and are currently pending.  The patient is on broad-spectrum antibiotic coverage with Zosyn and vancomycin.  She notes some mild mild soreness and after being constipated for several days notes some diarrhea and some abdominal soreness without any focal  tenderness or peritoneal signs. She has had a CT of the abdomen which showed no overt colitis, typhlitis or any other overt intra-abdominal process. Stool studies have been sent out which were negative for C. difficile with a negative GI pathogen panel. No other overt focal symptoms suggesting an obvious focus of infection.  We discussed that this could represent mucositis from her chemotherapy.  Her fevers are starting to come down. We discussed that she would need to be on Granix daily until her [place] is more than 1000 for 2 successive days.   MEDICAL HISTORY:  Past Medical History:  Diagnosis Date  . Anxiety   . Cancer [facility])    right breast   . Family history of breast cancer   . Family history of colon cancer   . GERD (gastroesophageal reflux disease)   . Headache    couple of migraines in the past  . History of kidney stones   . Vitamin D deficiency    ONCOLOGIC HISTORY  . Oncology History    Cancer Staging Malignant neoplasm of upper-outer quadrant of right breast in female, estrogen receptor positive ([place]) Staging form: Breast, AJCC 8th Edition - Clinical stage from 06/11/2017: Stage IB (cT3, cN0, cM0, G2, ER: Positive, PR: Positive, HER2: Positive) - Signed by [name], MD on 06/16/2017 - Pathologic stage from 07/22/2017: Stage IB (pT3(m), pN2a(sn), cM0, G2, ER: Positive, PR: Positive, HER2: Positive) - Signed by [name], NP on 08/04/2017      Malignant neoplasm of upper-outer quadrant of right breast in female, estrogen receptor positive ([place])   06/11/2017 Initial Biopsy    Diagnosis  06/11/17 Breast, right, needle core biopsy, 11:00 o'clock - INVASIVE DUCTAL CARCINOMA, G2      06/11/2017 Mammogram    [name] and [facility] Diagnostic Breast Tomo Bilateral 06/11/17 IMPRESSION: 1. Highly suspicious mass within the retroareolar right breast, extending from the 8:00 to 11:00 axes, anterior to posterior depth, extending anteriorly to the nipple with associated nipple retraction,  measuring at least 5.5 cm by ultrasound  2. No sonographic evidence of metastatic lymphadenopathy in the right axilla. 3. No evidence of malignancy within the left breast.       06/11/2017 Initial Diagnosis    Malignant neoplasm of upper-outer quadrant of right breast in female, estrogen receptor positive ([place])      06/11/2017 Receptors her2    Estrogen Receptor: 100%, POSITIVE, STRONG STAINING INTENSITY Progesterone Receptor: 100%, POSITIVE, STRONG STAINING INTENSITY Proliferation Marker Ki67: 12%Proliferation Marker Ki67: 12%  HER2 - **POSITIVE** RATIO OF HER2/CEP17 SIGNALS 2.55 AVERAGE HER2 COPY NUMBER PER CELL 4.85      06/25/2017 Genetic Testing    Negative genetic testing on the 9 gene STAT panel.  The STAT Breast cancer panel offered by [facility] includes sequencing and rearrangement analysis for the following 9 genes:  ATM, BRCA1, BRCA2, CDH1, CHEK2, PALB2, PTEN, STK11 and TP53.   The report date is June 25, 2017.  Negative genetic testing on the common hereditary cancer panel.  The Hereditary Gene Panel offered by [facility] includes sequencing and/or deletion duplication testing of the following 46 genes: APC, ATM, AXIN2, BARD1, BMPR1A, BRCA1, BRCA2, BRIP1, CDH1, CDKN2A (p14ARF), CDKN2A (p16INK4a), CHEK2, CTNNA1, DICER1, EPCAM (Deletion/duplication testing only), GREM1 (promoter region deletion/duplication testing only), KIT, MEN1, MLH1, MSH2, MSH3, MSH6, MUTYH, NBN, NF1, NHTL1, PALB2, PDGFRA, PMS2, POLD1, POLE, PTEN, RAD50, RAD51C, RAD51D, SDHB, SDHC, SDHD, SMAD4, SMARCA4. STK11, TP53, TSC1, TSC2, and VHL.  The following genes were evaluated for sequence changes only: SDHA and HOXB13 c.251G>A variant only.  The report date is June 25, 2017.       07/02/2017 Imaging    CT cAP 07/02/17 IMPRESSION: 1. Subareolar right breast mass. No compelling findings of nodal or metastatic involvement. 2. Old granulomatous disease. 3. There is a 7 mm enhancing or hyperdense focus posteriorly  in segment 7 of the liver in the subcapsular region. By virtue of its small size this lesion is technically nonspecific although statistically likely to be a small benign lesions such as flash filling hemangioma. This may warrant surveillance. 4.  Prominent stool throughout the colon favors constipation. 5. Degenerative disc disease and spondylosis at L5-S1 likely causing mild impingement.      07/02/2017 Imaging    Bone scan 07/02/17 IMPRESSION: Today' s exam is negative. I ascribed the tiny focus of activity just proximal to the left antecubital region to injection site.      07/22/2017 Surgery    RIGHT TOTAL MASTECTOMY WITH RIGHT AXILLARY SENTINEL LYMPH NODE BIOPSY, LEFT PROPHYLACTIC MASTECTOM and INSERTION PORT-A-CATH WITH [name] by [name] and [facility]       07/22/2017 Pathology Results    Diagnosis 1. Breast, simple mastectomy, Left - FIBROADENOMA. - NO MALIGNANCY IDENTIFIED. 2. 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 - ADDITIONAL FOCUS OF LOBULAR CARCINOMA, GRADE 2, SPANNING 2.2 CM. - INTERMEDIATE GRADE DUCTAL CARCINOMA IN SITU. - INVASIVE CARCINOMA COMES TO WITHIN 0.2 TO 0.3 CM OF THE DEEP MARGIN, FOCALLY. - TUMOR FOCALLY INVOLVES EPIDERMIS. - LYMPHOVASCULAR INVASION OF DERMAL LYMPHATICS. - ONE OF ONE LYMPH NODES NEGATIVE FOR CARCINOMA (0/1). -  SEE ONCOLOGY TABLE. 3. Lymph node, sentinel, biopsy, Right axillary - ONE LYMPH NODE WITH ISOLATED TUMOR CELLS (0/1). 4. Lymph node, sentinel, biopsy, Right - METASTATIC CARCINOMA IN ONE OF ONE LYMPH NODES (1/1). 5. Lymph node, sentinel, biopsy, Right - ONE OF ONE LYMPH NODES NEGATIVE FOR CARCINOMA (0/1). 6. Lymph node, sentinel, biopsy, Right - METASTATIC CARCINOMA IN ONE OF ONE LYMPH NODES (1/1). - EXTRACAPSULAR EXTENSION. 7. Lymph node, sentinel, biopsy, Right - METASTATIC CARCINOMA IN ONE OF ONE LYMPH NODES (1/1). - EXTRACAPSULAR EXTENSION. 8. Lymph node,  sentinel, biopsy, Right - ONE OF ONE LYMPH NODES NEGATIVE FOR CARCINOMA (0/1). 9. Lymph node, sentinel, biopsy, Right - ONE OF ONE LYMPH NODES NEGATIVE FOR CARCINOMA (0/1). 10. Lymph node, sentinel, biopsy, Right - METASTATIC CARCINOMA IN ONE OF ONE LYMPH NODES (1/1). - EXTRACAPSULAR EXTENSION. 11. Lymph node, biopsy, Right axillary - ONE OF ONE LYMPH NODES NEGATIVE FOR CARCINOMA (0/1).        Chemotherapy    PENDING adjuvant TCHP every 3 weeks for 6 cycles starting 08/28/17, followed by maintenance Herceptin with or without Perjeta for 6-12 months        08/25/2017 Imaging    MRI abdomen done at [facility]:  Enhancing structure measing 5 mm is compatible with a flash filling hemangioma. No specific findings to suggest metastatic cancer        SURGICAL HISTORY: Past Surgical History:  Procedure Laterality Date  . egg donation    . kidney stone removal    . MASTECTOMY W/ SENTINEL NODE BIOPSY Bilateral 07/22/2017   Procedure: RIGHT TOTAL MASTECTOMY WITH RIGHT AXILLARY SENTINEL LYMPH NODE BIOPSY, LEFT PROPHYLACTIC MASTECTOMY;  Surgeon: Emelia Loron, MD;  Location: [facility] OR;  Service: General;  Laterality: Bilateral;  . PORTACATH PLACEMENT Right 07/22/2017   Procedure: INSERTION PORT-A-CATH WITH [name];  Surgeon: Emelia Loron, MD;  Location: [facility] OR;  Service: General;  Laterality: Right;    SOCIAL HISTORY: Social History   Social History  . Marital status: Single    Spouse name: N/A  . Number of children: N/A  . Years of education: N/A   Occupational History  . Not on file.   Social History Main Topics  . Smoking status: Never Smoker  . Smokeless tobacco: Former Neurosurgeon  . Alcohol use Yes     Comment: once a year  . Drug use: No  . Sexual activity: Yes   Other Topics Concern  . Not on file   Social History Narrative  . No narrative on file    FAMILY HISTORY: Family History  Problem Relation Age of Onset  . Breast cancer Paternal Grandmother 58  . Colon cancer  Paternal Grandmother 44  . Breast cancer Mother 32  . Prostate cancer Paternal Grandfather        dx in his 24s  . Leukemia Paternal Grandfather   . Lung cancer Maternal Uncle        heavy smoker  . Other Paternal Uncle        farm accident  . Stroke Maternal Grandfather     ALLERGIES:  has No Known Allergies.  MEDICATIONS:  Current Facility-Administered Medications  Medication Dose Route Frequency Provider Last Rate Last Dose  . 0.9 %  sodium chloride infusion   Intravenous Continuous Osvaldo Shipper, MD 125 mL/hr at 09/05/17 1752    . acetaminophen (TYLENOL) tablet 650 mg  650 mg Oral Q6H PRN Osvaldo Shipper, MD   650 mg at 09/05/17 0657   Or  . acetaminophen (TYLENOL) suppository 650  mg  650 mg Rectal Q6H PRN Bonnielee Haff, MD      . enoxaparin (LOVENOX) injection 40 mg  40 mg Subcutaneous Q24H Bonnielee Haff, MD   40 mg at 09/04/17 2106  . feeding supplement (ENSURE ENLIVE) (ENSURE ENLIVE) liquid 237 mL  237 mL Oral BID BM Bonnielee Haff, MD      . fluticasone [facility]) 50 MCG/ACT nasal spray 2 spray  2 spray Each Nare Daily Schorr, Rhetta Mura, NP   2 spray at 09/04/17 2232  . hydrocortisone sodium succinate (SOLU-CORTEF) 100 MG injection 100 mg  100 mg Intravenous Q8H Bonnielee Haff, MD   100 mg at 09/05/17 1303  . iopamidol (ISOVUE-300) 61 % injection 100 mL  100 mL Intravenous Once PRN Bonnielee Haff, MD      . iopamidol (ISOVUE-300) 61 % injection 15 mL  15 mL Oral Once PRN Bonnielee Haff, MD      . iopamidol (ISOVUE-300) 61 % injection        Stopped at 09/05/17 1305  . iopamidol (ISOVUE-300) 61 % injection           . morphine 4 MG/ML injection 2 mg  2 mg Intravenous Q2H PRN Bonnielee Haff, MD   2 mg at 09/05/17 1340  . ondansetron (ZOFRAN) tablet 4 mg  4 mg Oral Q6H PRN Bonnielee Haff, MD       Or  . ondansetron [facility]) injection 4 mg  4 mg Intravenous Q6H PRN Bonnielee Haff, MD   4 mg at 09/05/17 1303  . piperacillin-tazobactam (ZOSYN) IVPB 3.375 g  3.375 g  Intravenous Q8H Berton Mount, RPH   Stopped at 09/05/17 1702  . sodium chloride flush (NS) 0.9 % injection 10-40 mL  10-40 mL Intracatheter PRN Bonnielee Haff, MD   10 mL at 09/05/17 0435  . Tbo-Filgrastim [facility]) injection 480 mcg  480 mcg Subcutaneous q1800 Bonnielee Haff, MD      . vancomycin [facility]) IVPB 750 mg/150 ml premix  750 mg Intravenous Q8H Berton Mount, [place]   Stopped at 09/05/17 1722   Facility-Administered Medications Ordered in Other Encounters  Medication Dose Route Frequency Provider Last Rate Last Dose  . vancomycin (VANCOCIN) 1,250 mg in sodium chloride 0.9 % 250 mL IVPB  1,250 mg Intravenous Once [name], MD        REVIEW OF SYSTEMS:    10 Point review of Systems was done is negative except as noted above.  PHYSICAL EXAMINATION: ECOG PERFORMANCE STATUS: 1 - Symptomatic but completely ambulatory  . Vitals:   09/05/17 0657 09/05/17 1259  BP:  110/67  Pulse:  (!) 104  Resp:  17  Temp: 99.9 F (37.7 C) 98.5 F (36.9 C)  SpO2:  100%   Filed Weights   09/04/17 1652 09/04/17 2021  Weight: 158 lb (71.7 kg) 158 lb 11.7 oz (72 kg)   .Body mass index is 25.62 kg/m.  GENERAL:alert, in no acute distress and comfortable SKIN: no acute rashes, no significant lesions EYES: conjunctiva are pink and non-injected, sclera anicteric OROPHARYNX: MMM, no exudates, no oropharyngeal erythema or ulceration NECK: supple, no JVD LYMPH:  no palpable lymphadenopathy in the cervical, axillary or inguinal regions BREAST-bilateral healed mastectomy scars.  No significant collection redness or induration over the surgical sites or axillary area on the right. LUNGS: clear to auscultation b/l with normal respiratory effort HEART: regular rate & rhythm ABDOMEN:  normoactive bowel sounds , non tender, not distended. Extremity: Trace pedal edema PSYCH: alert & oriented x 3  with fluent speech NEURO: no focal motor/sensory deficits  LABORATORY DATA:  I have reviewed the  data as listed  . CBC Latest Ref Rng & Units 09/05/2017 09/04/2017 09/04/2017  WBC 4.0 - 10.5 K/uL 0.3(LL) 0.7(LL) 0.8(LL)  Hemoglobin 12.0 - 15.0 g/dL 10.0(L) 11.6(L) 12.4  Hematocrit 36.0 - 46.0 % 29.4(L) 34.3(L) 36.8  Platelets 150 - 400 K/uL 148(L) 165 154    . CMP Latest Ref Rng & Units 09/05/2017 09/04/2017 09/04/2017  Glucose 65 - 99 mg/dL 159(H) 110(H) 137  BUN 6 - 20 mg/dL 9 11 14.1  Creatinine 0.44 - 1.00 mg/dL 0.83 0.76 0.9  Sodium 135 - 145 mmol/L 134(L) 132(L) 134(L)  Potassium 3.5 - 5.1 mmol/L 3.9 3.8 4.1  Chloride 101 - 111 mmol/L 103 98(L) -  CO2 22 - 32 mmol/L _0 Calcium 8.9 - 10.3 mg/dL 7.5(L) 8.4(L) 8.9  Total Protein 6.5 - 8.1 g/dL 5.7(L) 6.5 6.8  Total Bilirubin 0.3 - 1.2 mg/dL 0.9 1.2 1.00  Alkaline Phos 38 - 126 U/L 43 52 57  AST 15 - 41 U/L _1 ALT 14 - 54 U/L _2 Component     Latest Ref Rng & Units 09/04/2017 09/05/2017  Campylobacter species     NOT DETECTED  NOT DETECTED  Plesimonas shigelloides     NOT DETECTED  NOT DETECTED  Salmonella species     NOT DETECTED  NOT DETECTED  Yersinia enterocolitica     NOT DETECTED  NOT DETECTED  Vibrio species     NOT DETECTED  NOT DETECTED  Vibrio cholerae     NOT DETECTED  NOT DETECTED  Enteroaggregative E coli (EAEC)     NOT DETECTED  NOT DETECTED  Enteropathogenic E coli (EPEC)     NOT DETECTED  NOT DETECTED  Enterotoxigenic E coli (ETEC)     NOT DETECTED  NOT DETECTED  Shiga like toxin producing E coli (STEC)     NOT DETECTED  NOT DETECTED  Shigella/Enteroinvasive E coli (EIEC)     NOT DETECTED  NOT DETECTED  Cryptosporidium     NOT DETECTED  NOT DETECTED  Cyclospora cayetanensis     NOT DETECTED  NOT DETECTED  Entamoeba histolytica     NOT DETECTED  NOT DETECTED  Giardia lamblia     NOT DETECTED  NOT DETECTED  Adenovirus F40/41     NOT DETECTED  NOT DETECTED  Astrovirus     NOT DETECTED  NOT DETECTED  Norovirus GI/GII     NOT DETECTED  NOT DETECTED  Rotavirus A      NOT DETECTED  NOT DETECTED  Sapovirus (I, II, IV, and V)     NOT DETECTED  NOT DETECTED  C Diff antigen     NEGATIVE  NEGATIVE  C Diff toxin     NEGATIVE  NEGATIVE  C Diff interpretation       No C. difficile detected.  Prothrombin Time     11.4 - 15.2 seconds 14.1   INR      1.10   Influenza A By PCR     NEGATIVE NEGATIVE   Influenza B By PCR     NEGATIVE NEGATIVE   HIV Screen 4th Generation wRfx     Non Reactive  Non Reactive  Procalcitonin     ng/mL <0.10   APTT     24 - 36 seconds 27      RADIOGRAPHIC STUDIES: I have personally reviewed the radiological  images as listed and agreed with the findings in the report. Ct Abdomen Pelvis W Contrast  Result Date: 09/05/2017 CLINICAL DATA:  Acute generalized abdominal pain. History of breast cancer. EXAM: CT ABDOMEN AND PELVIS WITH CONTRAST TECHNIQUE: Multidetector CT imaging of the abdomen and pelvis was performed using the standard protocol following bolus administration of intravenous contrast. CONTRAST:  122m ISOVUE-300 IOPAMIDOL (ISOVUE-300) INJECTION 61% COMPARISON:  MRI of August 25, 2017.  CT scan of July 02, 2017. FINDINGS: Lower chest: No acute abnormality. Hepatobiliary: No gallstones are noted. Stable 7 mm hyperdensity is noted posteriorly in right hepatic lobe most consistent with hemangioma based on recent MRI. No other abnormality seen in the hepatic parenchyma. Pancreas: Unremarkable. No pancreatic ductal dilatation or surrounding inflammatory changes. Spleen: Normal in size without focal abnormality. Adrenals/Urinary Tract: Adrenal glands are unremarkable. Kidneys are normal, without renal calculi, focal lesion, or hydronephrosis. Bladder is unremarkable. Stomach/Bowel: Stomach is within normal limits. Appendix appears normal. No evidence of bowel wall thickening, distention, or inflammatory changes. Vascular/Lymphatic: No significant vascular findings are present. No enlarged abdominal or pelvic lymph nodes.  Reproductive: Uterus and left ovary unremarkable. 2.4 cm involuting cyst is noted in right ovary. Other: No abdominal wall hernia or abnormality. No abdominopelvic ascites. Musculoskeletal: No acute or significant osseous findings. IMPRESSION: Stable 7 mm hyperdensity seen in right hepatic lobe most consistent with hemangioma based on recent MRI. 2.4 cm right ovarian involuting cyst is noted. No other abnormality seen in the abdomen or pelvis. Electronically Signed   By: JMarijo Conception M.D.   On: 09/05/2017 16:20   Dg Chest Port 1 View  Result Date: 09/04/2017 CLINICAL DATA:  Breast cancer, first chemotherapy treatment 6 days ago. Fever, hypotensive and tachycardic. EXAM: PORTABLE CHEST 1 VIEW COMPARISON:  Chest radiograph July 22, 2017 FINDINGS: Cardiomediastinal silhouette is normal. No pleural effusions or focal consolidations. Trachea projects midline and there is no pneumothorax. Soft tissue planes and included osseous structures are non-suspicious. Single lumen RIGHT chest Port-A-Cath distal tip projects in mid superior vena cava. Bilateral mastectomies. Surgical clips RIGHT axilla. IMPRESSION: No acute cardiopulmonary process. Electronically Signed   By: CElon AlasM.D.   On: 09/04/2017 17:19    ASSESSMENT & PLAN:   41year old Caucasian female with  #1 Severe neutropenia with fevers and symptoms of sepsis. Neutropenia is related to her first cycle of adjuvant TCHP treatment on 08/29/2017.  We anticipate her counts should nadir between day 7 and day 14 and then would bounce back.  She received only 3 days of Granix post treatment and not Neulasta due to insurance approval issues.  No overt source of infection. Does seem to have some oral mucositis and likely diarrhea from GI mucositis which could be the source of her fevers. CT of the abdomen and stool studies unrevealing thus far.  Blood cultures pending Pro-calcitonin levels not elevated Influenza swab negative  #2  Locally advanced stage IIIA ER positive PR positive HER-2/neu positive breast cancer on the right side.  Status post right-sided mastectomy with right axillary lymph node dissection.  Prophylactic left-sided mastectomy. Follows with Dr. FBurr Medicofor medical oncology.  #3 anemia related to chemotherapy and sepsis no overt bleeding. #4 mild thrombocytopenia related to chemotherapy. PLAN  -We will follow-up on blood culture results. -Agree with broad-spectrum antibiotic coverage with Zosyn and vancomycin.  To be tailored based on culture results . -Daily filgrastim 480 mcg until ABentonmore than 1000 for 2 successive days. -Will need to add Neulasta with her next  cycle of treatment +/- chemotherapy dose adjustments.  Given profound neutropenia with fevers and sepsis. (to be decided by Dr Burr Medico on f/u) -daily cbc with diff -will continue to follow in hospital. Not ready for discharge at this time pending resolution of neutropenia and sepsis. -will need f/u with Dr Lennie Odor NP within 5-7 days of discharge with rpt labs  -Appreciate excellent cares by the hospitalist team.  All of the patients questions were answered with apparent satisfaction. The patient knows to call the clinic with any problems, questions or concerns.  I spent 45 minutes counseling the patient face to face. The total time spent in the appointment was 70 minutes and more than 50% was on counseling and direct patient cares.    Sullivan Lone MD [place] AAHIVMS [facility] Hematology/Oncology Physician [facility]  (Office):       [phone] (Work cell):  [phone] (Fax):           [phone]  09/05/2017 6:35 PM

## 2017-09-06 DIAGNOSIS — R197 Diarrhea, unspecified: Secondary | ICD-10-CM

## 2017-09-06 DIAGNOSIS — E876 Hypokalemia: Secondary | ICD-10-CM

## 2017-09-06 LAB — BASIC METABOLIC PANEL
Anion gap: 5 (ref 5–15)
BUN: 5 mg/dL — ABNORMAL LOW (ref 6–20)
CHLORIDE: 107 mmol/L (ref 101–111)
CO2: 27 mmol/L (ref 22–32)
Calcium: 7.7 mg/dL — ABNORMAL LOW (ref 8.9–10.3)
Creatinine, Ser: 0.75 mg/dL (ref 0.44–1.00)
GFR calc Af Amer: >60 mL/min (ref >60)
GLUCOSE: 170 mg/dL — AB (ref 65–99)
POTASSIUM: 3.2 mmol/L — AB (ref 3.5–5.1)
Sodium: 139 mmol/L (ref 135–145)

## 2017-09-06 LAB — CBC WITH DIFFERENTIAL/PLATELET
BASOS PCT: 0 %
Basophils Absolute: 0 10*3/uL (ref 0.0–0.1)
EOS ABS: 0 10*3/uL (ref 0.0–0.7)
Eosinophils Relative: 0 %
HCT: 25.7 % — ABNORMAL LOW (ref 36.0–46.0)
HEMOGLOBIN: 9 g/dL — AB (ref 12.0–15.0)
Lymphocytes Relative: 30 %
Lymphs Abs: 0.4 10*3/uL — ABNORMAL LOW (ref 0.7–4.0)
MCH: 31.3 pg (ref 26.0–34.0)
MCHC: 35 g/dL (ref 30.0–36.0)
MCV: 89.2 fL (ref 78.0–100.0)
Monocytes Absolute: 0.7 10*3/uL (ref 0.1–1.0)
Monocytes Relative: 46 %
NEUTROS PCT: 24 %
Neutro Abs: 0.3 10*3/uL — ABNORMAL LOW (ref 1.7–7.7)
PLATELETS: 133 10*3/uL — AB (ref 150–400)
RBC: 2.88 MIL/uL — ABNORMAL LOW (ref 3.87–5.11)
RDW: 12.1 % (ref 11.5–15.5)
WBC: 1.4 10*3/uL — CL (ref 4.0–10.5)

## 2017-09-06 MED ORDER — LOPERAMIDE HCL 2 MG PO CAPS
2.0000 mg | ORAL_CAPSULE | Freq: Three times a day (TID) | ORAL | Status: DC | PRN
  Administered 2017-09-09 (×2): 2 mg via ORAL
  Filled 2017-09-06 (×2): qty 1

## 2017-09-06 MED ORDER — POTASSIUM CHLORIDE CRYS ER 20 MEQ PO TBCR
40.0000 meq | EXTENDED_RELEASE_TABLET | Freq: Once | ORAL | Status: AC
  Administered 2017-09-06: 40 meq via ORAL
  Filled 2017-09-06: qty 2

## 2017-09-06 MED ORDER — PROMETHAZINE HCL 25 MG/ML IJ SOLN
12.5000 mg | Freq: Four times a day (QID) | INTRAMUSCULAR | Status: DC | PRN
  Administered 2017-09-06 – 2017-09-09 (×9): 12.5 mg via INTRAVENOUS
  Filled 2017-09-06 (×9): qty 1

## 2017-09-06 NOTE — Progress Notes (Signed)
Pt vomited x1 during my shift. 250cc of emesis after consuming ensure. It .  MD made aware. Changes to medication regime by MD Pt very tearful. Allowed her to vent and share her feeling in regards to her dx. Medicated for nausea and pain x. Pt as rested comfortably for the remainder of my shift. Cont with plan of care

## 2017-09-06 NOTE — Progress Notes (Signed)
[facility] PROGRESS NOTE  Sheila [name] LYY:[id number] DOB: Nov 06, 1976 DOA: 09/04/2017  PCP: [name], MD  Brief History/Interval Summary: 41 year old Caucasian female with a past medical history of breast cancer status post bilateral mastectomy, who started chemotherapy with cycle on October 12, presented with fever, weakness, dizziness.She was found to be febrile. She was found to be neutropenic. She was hospitalized for further management.  Reason for Visit: Neutropenic fever, sepsis  Consultants: Oncology  Procedures: none  Antibiotics: Vancomycin and Zosyn  Subjective/Interval History: Patient complains of nausea this morning.  She had an episode of emesis.  Continues to have occasional loose stool but not as frequently as before.  Continues to have lower abdominal pain and discomfort.  She feels like she is going to start her periods soon.  Denies any discomfort with urinating.  ROS: Denies any headaches  Objective:  Vital Signs  Vitals:   09/05/17 0657 09/05/17 1259 09/05/17 2236 09/06/17 0501  BP:  110/67 97/65 (!) 92/56  Pulse:  (!) 104 (!) 104 95  Resp:  17 16 16   Temp: 99.9 F (37.7 C) 98.5 F (36.9 C) 98.5 F (36.9 C) 97.9 F (36.6 C)  TempSrc: Oral Oral Axillary Oral  SpO2:  100% 100% 98%  Weight:      Height:        Intake/Output Summary (Last 24 hours) at 09/06/17 1258 Last data filed at 09/06/17 1027  Gross per 24 hour  Intake          2579.17 ml  Output              800 ml  Net          1779.17 ml   Filed Weights   09/04/17 1652 09/04/17 2021  Weight: 71.7 kg (158 lb) 72 kg (158 lb 11.7 oz)    General appearance: Awake alert.  No distress. Resp: Clear to auscultation bilaterally.  No wheezing rales or rhonchi. Cardio: S1-S2 is normal and regular.  No S3-S4.  No rubs murmurs or bruit GI: Abdomen remained soft.  Tenderness in the lower abdomen without any rebound rigidity or guarding.  No masses or organomegaly.  Bowel  sounds are present. Extremities: No edema Neurologic: awake and alert. Oriented 3. No focal neurological deficits.  Lab Results:  Data Reviewed: I have personally reviewed following labs and imaging studies  CBC:  Recent Labs Lab 09/04/17 1325 09/04/17 1658 09/05/17 0437 09/06/17 0406  WBC 0.8* 0.7* 0.3* 1.4*  NEUTROABS 0.0* 0.0* 0.0* 0.3*  HGB 12.4 11.6* 10.0* 9.0*  HCT 36.8 34.3* 29.4* 25.7*  MCV 91.5 89.6 90.5 89.2  PLT 154 165 148* 133*    Basic Metabolic Panel:  Recent Labs Lab 09/04/17 1326 09/04/17 1658 09/05/17 0437 09/06/17 0406  NA 134* 132* 134* 139  K 4.1 3.8 3.9 3.2*  CL  --  98* 103 107  CO2 25 23 23 27   GLUCOSE 137 110* 159* 170*  BUN 14.1 11 9  5*  CREATININE 0.9 0.76 0.83 0.75  CALCIUM 8.9 8.4* 7.5* 7.7*    GFR: Estimated Creatinine Clearance: 96 mL/min (by C-G formula based on SCr of 0.75 mg/dL).  Liver Function Tests:  Recent Labs Lab 09/04/17 1326 09/04/17 1658 09/05/17 0437  AST 12 19 24   ALT 13 16 21   ALKPHOS 57 52 43  BILITOT 1.00 1.2 0.9  PROT 6.8 6.5 5.7*  ALBUMIN 3.8 3.5 2.9*    Coagulation Profile:  Recent Labs Lab 09/04/17 2111  INR 1.10  Recent Results (from the past 240 hour(s))  Culture, Blood     Status: None (Preliminary result)   Collection Time: 09/04/17  3:06 PM  Result Value Ref Range Status   BLOOD CULTURE, ROUTINE Preliminary report  Preliminary   Organism ID, Bacteria Comment  Preliminary    Comment: No growth detected at this time.  Culture, Blood     Status: None (Preliminary result)   Collection Time: 09/04/17  3:06 PM  Result Value Ref Range Status   BLOOD CULTURE, ROUTINE Preliminary report  Preliminary   Organism ID, Bacteria Comment  Preliminary    Comment: No growth detected at this time.  Blood Culture (routine x 2)     Status: None (Preliminary result)   Collection Time: 09/04/17  5:25 PM  Result Value Ref Range Status   Specimen Description BLOOD LEFT ANTECUBITAL  Final    Special Requests   Final    BOTTLES DRAWN AEROBIC AND ANAEROBIC Blood Culture adequate volume   Culture   Final    NO GROWTH 2 DAYS Performed at [facility], [address] [place], [place] [address]    Report Status PENDING  Incomplete  Blood Culture (routine x 2)     Status: None (Preliminary result)   Collection Time: 09/04/17 11:30 PM  Result Value Ref Range Status   Specimen Description BLOOD RIGHT HAND  Final   Special Requests   Final    BOTTLES DRAWN AEROBIC AND ANAEROBIC Blood Culture adequate volume   Culture   Final    NO GROWTH 1 DAY Performed at [facility], [place] [address], [place], [place] [address]    Report Status PENDING  Incomplete  Gastrointestinal Panel by PCR , Stool     Status: None   Collection Time: 09/05/17  3:55 AM  Result Value Ref Range Status   Campylobacter species NOT DETECTED NOT DETECTED Final   Plesimonas shigelloides NOT DETECTED NOT DETECTED Final   Salmonella species NOT DETECTED NOT DETECTED Final   Yersinia enterocolitica NOT DETECTED NOT DETECTED Final   Vibrio species NOT DETECTED NOT DETECTED Final   Vibrio cholerae NOT DETECTED NOT DETECTED Final   Enteroaggregative E coli (EAEC) NOT DETECTED NOT DETECTED Final   Enteropathogenic E coli (EPEC) NOT DETECTED NOT DETECTED Final   Enterotoxigenic E coli (ETEC) NOT DETECTED NOT DETECTED Final   Shiga like toxin producing E coli (STEC) NOT DETECTED NOT DETECTED Final   Shigella/Enteroinvasive E coli (EIEC) NOT DETECTED NOT DETECTED Final   Cryptosporidium NOT DETECTED NOT DETECTED Final   Cyclospora cayetanensis NOT DETECTED NOT DETECTED Final   Entamoeba histolytica NOT DETECTED NOT DETECTED Final   Giardia lamblia NOT DETECTED NOT DETECTED Final   Adenovirus F40/41 NOT DETECTED NOT DETECTED Final   Astrovirus NOT DETECTED NOT DETECTED Final   Norovirus GI/GII NOT DETECTED NOT DETECTED Final   Rotavirus A NOT DETECTED NOT DETECTED Final   Sapovirus (I, II, IV, and V) NOT  DETECTED NOT DETECTED Final  C difficile quick scan w PCR reflex     Status: None   Collection Time: 09/05/17  3:55 AM  Result Value Ref Range Status   C Diff antigen NEGATIVE NEGATIVE Final   C Diff toxin NEGATIVE NEGATIVE Final   C Diff interpretation No C. difficile detected.  Final      Radiology Studies: Ct Abdomen Pelvis W Contrast  Result Date: 09/05/2017 CLINICAL DATA:  Acute generalized abdominal pain. History of breast cancer. EXAM: CT ABDOMEN AND  PELVIS WITH CONTRAST TECHNIQUE: Multidetector CT imaging of the abdomen and pelvis was performed using the standard protocol following bolus administration of intravenous contrast. CONTRAST:  151mL ISOVUE-300 IOPAMIDOL (ISOVUE-300) INJECTION 61% COMPARISON:  MRI of August 25, 2017.  CT scan of July 02, 2017. FINDINGS: Lower chest: No acute abnormality. Hepatobiliary: No gallstones are noted. Stable 7 mm hyperdensity is noted posteriorly in right hepatic lobe most consistent with hemangioma based on recent MRI. No other abnormality seen in the hepatic parenchyma. Pancreas: Unremarkable. No pancreatic ductal dilatation or surrounding inflammatory changes. Spleen: Normal in size without focal abnormality. Adrenals/Urinary Tract: Adrenal glands are unremarkable. Kidneys are normal, without renal calculi, focal lesion, or hydronephrosis. Bladder is unremarkable. Stomach/Bowel: Stomach is within normal limits. Appendix appears normal. No evidence of bowel wall thickening, distention, or inflammatory changes. Vascular/Lymphatic: No significant vascular findings are present. No enlarged abdominal or pelvic lymph nodes. Reproductive: Uterus and left ovary unremarkable. 2.4 cm involuting cyst is noted in right ovary. Other: No abdominal wall hernia or abnormality. No abdominopelvic ascites. Musculoskeletal: No acute or significant osseous findings. IMPRESSION: Stable 7 mm hyperdensity seen in right hepatic lobe most consistent with hemangioma based on  recent MRI. 2.4 cm right ovarian involuting cyst is noted. No other abnormality seen in the abdomen or pelvis. Electronically Signed   By: Marijo Conception, M.D.   On: 09/05/2017 16:20   Dg Chest Port 1 View  Result Date: 09/04/2017 CLINICAL DATA:  Breast cancer, first chemotherapy treatment 6 days ago. Fever, hypotensive and tachycardic. EXAM: PORTABLE CHEST 1 VIEW COMPARISON:  Chest radiograph July 22, 2017 FINDINGS: Cardiomediastinal silhouette is normal. No pleural effusions or focal consolidations. Trachea projects midline and there is no pneumothorax. Soft tissue planes and included osseous structures are non-suspicious. Single lumen RIGHT chest Port-A-Cath distal tip projects in mid superior vena cava. Bilateral mastectomies. Surgical clips RIGHT axilla. IMPRESSION: No acute cardiopulmonary process. Electronically Signed   By: Elon Alas M.D.   On: 09/04/2017 17:19     Medications:  Scheduled: . enoxaparin (LOVENOX) injection  40 mg Subcutaneous Q24H  . feeding supplement (ENSURE ENLIVE)  237 mL Oral BID BM  . fluticasone  2 spray Each Nare Daily  . hydrocortisone sod succinate (SOLU-CORTEF) inj  100 mg Intravenous Q8H  . Tbo-filgastrim (GRANIX) SQ  480 mcg Subcutaneous q1800   Continuous: . sodium chloride 125 mL/hr at 09/05/17 1752  . piperacillin-tazobactam (ZOSYN)  IV Stopped (09/06/17 2409)  . vancomycin Stopped (09/06/17 0931)   BDZ:[id number] **OR** acetaminophen, iopamidol, iopamidol, morphine injection, ondansetron **OR** ondansetron (ZOFRAN) IV, promethazine, sodium chloride flush  Assessment/Plan:  Principal Problem:   Sepsis ([place]) Active Problems:   Malignant neoplasm of upper-outer quadrant of right breast in female, estrogen receptor positive ([place])   Port-A-Cath in place   Neutropenic fever ([place])   Hyponatremia   Malignant neoplasm of breast in female, estrogen receptor positive ([place])    Sepsis/neutropenic fever  Etiology/source of infection  unclear so far. The erythematous area around her Port-A-Cath is stable and thought to be due to reaction to tape.  Influenza panel was negative.  Stool studies including C. difficile and GI pathogen panel unremarkable.  Lactic acid level was elevated and has improved.  Blood cultures negative so far.  CT scan of the abdomen pelvis without any clear-cut abnormalities except for an ovarian cyst on the right.  Pro-calcitonin level was less than 0.1.  Patient remains on vancomycin and Zosyn which we will continue for another 24 hours.  If cultures  remain negative tomorrow she can be transitioned to alternative antibiotics.  Her neutrophil count has improved to 300.  Wait for it to come up to 1000 before stopping filgrastim. HIV nonreactive.  Symptoms of nausea most likely due to chemotherapy.  Treat symptomatically.  Can give Imodium for further episodes of diarrhea.  History of breast cancer status post bilateral mastectomy. Followed by oncology. She started her chemotherapy on October 12. She's had 1 cycle so far with Taxotere, carboplatin, Herceptin, and perjeta. She also received dexamethasone along with this chemotherapy. She's currently on stress dose steroids with hydrocortisone.  We will start tapering this down from tomorrow.  Patient has borderline low blood pressures at baseline.  Hyponatremia/hypokalemia Sodium level is normal. Potassium will be repleted.  Cut back on IV fluids.  Hyperglycemia Secondary to steroids.  Should improve steroid is tapered down.  No history of diabetes.  Normocytic anemia. No evidence for overt bleeding.  Mild drop in hemoglobin is noted.  Continue to monitor for now.   DVT Prophylaxis: Lovenox    Code Status: full code  Family Communication: Discussed with the patient and her husband Disposition Plan: Management as outlined above.    LOS: 2 days   [facility] Pager [phone] 09/06/2017, 12:58 PM  If 7PM-7AM, please contact  night-coverage at [email or web address], password [facility]

## 2017-09-06 NOTE — Progress Notes (Signed)
Sheila [name]   HEMATOLOGY/ONCOLOGY INPATIENT PROGRESS NOTE  Date of Service: 09/06/2017  Inpatient Attending: .Bonnielee Haff, MD   SUBJECTIVE  Patient was seen in follow-up today. Notes no significant new fevers. Abdomen feels less tender Diarrhea is resolving. Overall feels somewhat better. Had 1 episode of nausea and emesis this morning but is not feeling better. ANC is up to 300. Continues to be on broad spectrum antibiotics. No specific positive cultures at this time. We discussed that some of her GI symptoms could be from the [place].  OBJECTIVE:  NAD  PHYSICAL EXAMINATION: . Vitals:   09/05/17 1259 09/05/17 2236 09/06/17 0501 09/06/17 1350  BP: 110/67 97/65 (!) 92/56 (!) 98/59  Pulse: (!) 104 (!) 104 95 (!) 101  Resp: '17 16 16 16  ' Temp: 98.5 F (36.9 C) 98.5 F (36.9 C) 97.9 F (36.6 C) 98.1 F (36.7 C)  TempSrc: Oral Axillary Oral Oral  SpO2: 100% 100% 98% 99%  Weight:      Height:       Filed Weights   09/04/17 1652 09/04/17 2021  Weight: 158 lb (71.7 kg) 158 lb 11.7 oz (72 kg)   .Body mass index is 25.62 kg/m.  GENERAL:alert, in no acute distress and comfortable SKIN: skin color, texture, turgor are normal, no rashes or significant lesions EYES: normal, conjunctiva are pink and non-injected, sclera clear OROPHARYNX:no exudate, no erythema and lips, buccal mucosa, and tongue normal  NECK: supple, no JVD, thyroid normal size, non-tender, without nodularity LYMPH:  no palpable lymphadenopathy in the cervical, axillary or inguinal LUNGS: clear to auscultation with normal respiratory effort HEART: regular rate & rhythm,  no murmurs and no lower extremity edema ABDOMEN: abdomen soft, minimal non focal TTP, no guarding/rigidity/rebound Musculoskeletal: no cyanosis of digits and no clubbing  PSYCH: alert & oriented x 3 with fluent speech NEURO: no focal motor/sensory deficits  MEDICAL HISTORY:  Past Medical History:  Diagnosis Date  . Anxiety   . Cancer [facility])    right breast   . Family history of breast cancer   . Family history of colon cancer   . GERD (gastroesophageal reflux disease)   . Headache    couple of migraines in the past  . History of kidney stones   . Vitamin D deficiency     SURGICAL HISTORY: Past Surgical History:  Procedure Laterality Date  . egg donation    . kidney stone removal    . MASTECTOMY W/ SENTINEL NODE BIOPSY Bilateral 07/22/2017   Procedure: RIGHT TOTAL MASTECTOMY WITH RIGHT AXILLARY SENTINEL LYMPH NODE BIOPSY, LEFT PROPHYLACTIC MASTECTOMY;  Surgeon: [name], MD;  Location: [place];  Service: General;  Laterality: Bilateral;  . PORTACATH PLACEMENT Right 07/22/2017   Procedure: INSERTION PORT-A-CATH WITH [name];  Surgeon: [name], MD;  Location: [place];  Service: General;  Laterality: Right;    SOCIAL HISTORY: Social History   Social History  . Marital status: Single    Spouse name: N/A  . Number of children: N/A  . Years of education: N/A   Occupational History  . Not on file.   Social History Main Topics  . Smoking status: Never Smoker  . Smokeless tobacco: Former Systems developer  . Alcohol use Yes     Comment: once a year  . Drug use: No  . Sexual activity: Yes   Other Topics Concern  . Not on file   Social History Narrative  . No narrative on file    FAMILY HISTORY: Family History  Problem Relation Age  of Onset  . Breast cancer Paternal Grandmother 33  . Colon cancer Paternal Grandmother 11  . Breast cancer Mother 53  . Prostate cancer Paternal Grandfather        dx in his 8s  . Leukemia Paternal Grandfather   . Lung cancer Maternal Uncle        heavy smoker  . Other Paternal Uncle        farm accident  . Stroke Maternal Grandfather     ALLERGIES:  has No Known Allergies.  MEDICATIONS:  Scheduled Meds: . enoxaparin (LOVENOX) injection  40 mg Subcutaneous Q24H  . feeding supplement (ENSURE ENLIVE)  237 mL Oral BID BM  . fluticasone  2 spray Each Nare Daily  .  hydrocortisone sod succinate (SOLU-CORTEF) inj  100 mg Intravenous Q8H  . Tbo-filgastrim (GRANIX) SQ  480 mcg Subcutaneous q1800   Continuous Infusions: . sodium chloride 75 mL/hr at 09/06/17 1302  . piperacillin-tazobactam (ZOSYN)  IV 3.375 g (09/06/17 1353)  . vancomycin Stopped (09/06/17 0931)   PRN Meds:.acetaminophen **OR** acetaminophen, iopamidol, iopamidol, loperamide, morphine injection, ondansetron **OR** ondansetron (ZOFRAN) IV, promethazine, sodium chloride flush  REVIEW OF SYSTEMS:    10 Point review of Systems was done is negative except as noted above.   LABORATORY DATA:  I have reviewed the data as listed  . CBC Latest Ref Rng & Units 09/06/2017 09/05/2017 09/04/2017  WBC 4.0 - 10.5 K/uL 1.4(LL) 0.3(LL) 0.7(LL)  Hemoglobin 12.0 - 15.0 g/dL 9.0(L) 10.0(L) 11.6(L)  Hematocrit 36.0 - 46.0 % 25.7(L) 29.4(L) 34.3(L)  Platelets 150 - 400 K/uL 133(L) 148(L) 165   ANC 300 . CMP Latest Ref Rng & Units 09/06/2017 09/05/2017 09/04/2017  Glucose 65 - 99 mg/dL 170(H) 159(H) 110(H)  BUN 6 - 20 mg/dL 5(L) 9 11  Creatinine 0.44 - 1.00 mg/dL 0.75 0.83 0.76  Sodium 135 - 145 mmol/L 139 134(L) 132(L)  Potassium 3.5 - 5.1 mmol/L 3.2(L) 3.9 3.8  Chloride 101 - 111 mmol/L 107 103 98(L)  CO2 22 - 32 mmol/L '27 23 23  ' Calcium 8.9 - 10.3 mg/dL 7.7(L) 7.5(L) 8.4(L)  Total Protein 6.5 - 8.1 g/dL - 5.7(L) 6.5  Total Bilirubin 0.3 - 1.2 mg/dL - 0.9 1.2  Alkaline Phos 38 - 126 U/L - 43 52  AST 15 - 41 U/L - 24 19  ALT 14 - 54 U/L - 21 16     RADIOGRAPHIC STUDIES: I have personally reviewed the radiological images as listed and agreed with the findings in the report. Ct Abdomen Pelvis W Contrast  Result Date: 09/05/2017 CLINICAL DATA:  Acute generalized abdominal pain. History of breast cancer. EXAM: CT ABDOMEN AND PELVIS WITH CONTRAST TECHNIQUE: Multidetector CT imaging of the abdomen and pelvis was performed using the standard protocol following bolus administration of intravenous  contrast. CONTRAST:  145m ISOVUE-300 IOPAMIDOL (ISOVUE-300) INJECTION 61% COMPARISON:  MRI of August 25, 2017.  CT scan of July 02, 2017. FINDINGS: Lower chest: No acute abnormality. Hepatobiliary: No gallstones are noted. Stable 7 mm hyperdensity is noted posteriorly in right hepatic lobe most consistent with hemangioma based on recent MRI. No other abnormality seen in the hepatic parenchyma. Pancreas: Unremarkable. No pancreatic ductal dilatation or surrounding inflammatory changes. Spleen: Normal in size without focal abnormality. Adrenals/Urinary Tract: Adrenal glands are unremarkable. Kidneys are normal, without renal calculi, focal lesion, or hydronephrosis. Bladder is unremarkable. Stomach/Bowel: Stomach is within normal limits. Appendix appears normal. No evidence of bowel wall thickening, distention, or inflammatory changes. Vascular/Lymphatic: No significant vascular findings  are present. No enlarged abdominal or pelvic lymph nodes. Reproductive: Uterus and left ovary unremarkable. 2.4 cm involuting cyst is noted in right ovary. Other: No abdominal wall hernia or abnormality. No abdominopelvic ascites. Musculoskeletal: No acute or significant osseous findings. IMPRESSION: Stable 7 mm hyperdensity seen in right hepatic lobe most consistent with hemangioma based on recent MRI. 2.4 cm right ovarian involuting cyst is noted. No other abnormality seen in the abdomen or pelvis. Electronically Signed   By: Marijo Conception, M.D.   On: 09/05/2017 16:20   Dg Chest Port 1 View  Result Date: 09/04/2017 CLINICAL DATA:  Breast cancer, first chemotherapy treatment 6 days ago. Fever, hypotensive and tachycardic. EXAM: PORTABLE CHEST 1 VIEW COMPARISON:  Chest radiograph July 22, 2017 FINDINGS: Cardiomediastinal silhouette is normal. No pleural effusions or focal consolidations. Trachea projects midline and there is no pneumothorax. Soft tissue planes and included osseous structures are non-suspicious. Single  lumen RIGHT chest Port-A-Cath distal tip projects in mid superior vena cava. Bilateral mastectomies. Surgical clips RIGHT axilla. IMPRESSION: No acute cardiopulmonary process. Electronically Signed   By: Elon Alas M.D.   On: 09/04/2017 17:19    ASSESSMENT & PLAN:   41 year old Caucasian female with  #1 Severe neutropenia with fevers and symptoms of sepsis. Neutropenia is related to her first cycle of adjuvant TCHP treatment on 08/29/2017.  We anticipate her counts should nadir between day 7 and day 14 and then would bounce back.  She received only 3 days of Granix post treatment and not Neulasta due to insurance approval issues.  No overt source of infection. Does seem to have some oral mucositis and likely diarrhea from GI mucositis which could be the source of her fevers. CT of the abdomen and stool studies unrevealing thus far.  Blood cultures negative at this time Pro-calcitonin levels not elevated Influenza swab negative  #2 Locally advanced stage IIIA ER positive PR positive HER-2/neu positive breast cancer on the right side.  Status post right-sided mastectomy with right axillary lymph node dissection.  Prophylactic left-sided mastectomy. Follows with Dr. Burr Medico for medical oncology.  #3 anemia related to chemotherapy and sepsis no overt bleeding. Hgb lower at 9.  #4 mild thrombocytopenia related to chemotherapy. #5 Diarrhea/abd discomfort - mucositis vs side effect of Perjeta -- resolving. PLAN  -patient is overall feeling well -ANC has improved from 0 to 300 -continue broad-spectrum antibiotic coverage with Zosyn and vancomycin - till neutropenia resolves. -Daily filgrastim 480 mcg until [place] more than 1000 for 2 successive days. -Will need to add Neulasta with her next cycle of treatment +/- chemotherapy dose adjustments.  Given profound neutropenia with fevers and sepsis. (to be decided by Dr Burr Medico on f/u) -daily cbc with diff -will continue to follow in hospital.  Not ready for discharge at this time pending resolution of neutropenia and sepsis. -will need f/u with Dr Lennie Odor NP within 5-7 days of discharge with rpt labs    I spent 20 minutes counseling the patient face to face. The total time spent in the appointment was 25 minutes and more than 50% was on counseling and direct patient cares.    Sullivan Lone MD [place] AAHIVMS [facility] Hematology/Oncology Physician [facility]  (Office):       [phone] (Work cell):  [phone] (Fax):           [phone]  09/06/2017 4:30 PM

## 2017-09-07 LAB — CBC WITH DIFFERENTIAL/PLATELET
BAND NEUTROPHILS: 50 %
BASOS PCT: 0 %
Basophils Absolute: 0 10*3/uL (ref 0.0–0.1)
Blasts: 0 %
EOS ABS: 0 10*3/uL (ref 0.0–0.7)
EOS PCT: 0 %
HCT: 26.8 % — ABNORMAL LOW (ref 36.0–46.0)
Hemoglobin: 9 g/dL — ABNORMAL LOW (ref 12.0–15.0)
LYMPHS ABS: 1.4 10*3/uL (ref 0.7–4.0)
LYMPHS PCT: 16 %
MCH: 30 pg (ref 26.0–34.0)
MCHC: 33.6 g/dL (ref 30.0–36.0)
MCV: 89.3 fL (ref 78.0–100.0)
MONO ABS: 1 10*3/uL (ref 0.1–1.0)
MYELOCYTES: 0 %
Metamyelocytes Relative: 2 %
Monocytes Relative: 11 %
NEUTROS PCT: 10 %
NRBC: 1 /100{WBCs} — AB
Neutro Abs: 5.8 10*3/uL (ref 1.7–7.7)
OTHER: 9 %
PLATELETS: 136 10*3/uL — AB (ref 150–400)
Promyelocytes Absolute: 2 %
RBC: 3 MIL/uL — ABNORMAL LOW (ref 3.87–5.11)
RDW: 12.5 % (ref 11.5–15.5)
WBC Morphology: INCREASED
WBC: 9 10*3/uL (ref 4.0–10.5)

## 2017-09-07 LAB — BASIC METABOLIC PANEL
Anion gap: 8 (ref 5–15)
CHLORIDE: 105 mmol/L (ref 101–111)
CO2: 27 mmol/L (ref 22–32)
Calcium: 7.9 mg/dL — ABNORMAL LOW (ref 8.9–10.3)
Creatinine, Ser: 0.68 mg/dL (ref 0.44–1.00)
GFR calc Af Amer: >60 mL/min (ref >60)
GFR calc non Af Amer: >60 mL/min (ref >60)
GLUCOSE: 124 mg/dL — AB (ref 65–99)
Potassium: 3.2 mmol/L — ABNORMAL LOW (ref 3.5–5.1)
Sodium: 140 mmol/L (ref 135–145)

## 2017-09-07 LAB — VANCOMYCIN, TROUGH: VANCOMYCIN TR: 13 ug/mL — AB (ref 15–20)

## 2017-09-07 LAB — CREATININE, SERUM
CREATININE: 0.72 mg/dL (ref 0.44–1.00)
GFR calc Af Amer: >60 mL/min (ref >60)
GFR calc non Af Amer: >60 mL/min (ref >60)

## 2017-09-07 LAB — MAGNESIUM: Magnesium: 2 mg/dL (ref 1.7–2.4)

## 2017-09-07 MED ORDER — HYDROCORTISONE NA SUCCINATE PF 100 MG IJ SOLR
50.0000 mg | Freq: Three times a day (TID) | INTRAMUSCULAR | Status: DC
  Administered 2017-09-07 – 2017-09-08 (×3): 50 mg via INTRAVENOUS
  Filled 2017-09-07 (×3): qty 2

## 2017-09-07 MED ORDER — LIP MEDEX EX OINT
TOPICAL_OINTMENT | CUTANEOUS | Status: DC | PRN
  Filled 2017-09-07: qty 7

## 2017-09-07 MED ORDER — MAGIC MOUTHWASH
10.0000 mL | Freq: Three times a day (TID) | ORAL | Status: DC | PRN
  Filled 2017-09-07 (×2): qty 10

## 2017-09-07 MED ORDER — POTASSIUM CHLORIDE CRYS ER 20 MEQ PO TBCR
40.0000 meq | EXTENDED_RELEASE_TABLET | ORAL | Status: AC
  Administered 2017-09-07 (×2): 40 meq via ORAL
  Filled 2017-09-07 (×2): qty 2

## 2017-09-07 NOTE — Progress Notes (Signed)
Pharmacy Antibiotic Note  Sheila [name] is a 41 y.o. female admitted on 09/04/2017 with sepsis/neutropenic fever.  Pharmacy has been consulted for vancomycin and zosyn dosing. Patient is noted to be neutropenic with granix given at [facility] today. Vancomycin and zosyn doses x 1 given at [facility] and ED  10/21: SCr stable. WBCs up quickly. ANC 0.3 --> 5.8, DC daily Granix now? Vanc trough = 13 (drawn about 8.5 hrs after dose) on 750mg  q8h. (Goal 15-53mcg/ml)  Plan:  Continue Vancomycin 750mg  IV q8h. Anticipate some accumulation.  Zosyn 3.375gm IV q8h over 4h infusion  Consider change zosyn to cefepime 2gm q8h to reduce risk of nephrotoxicity  Daily SCr for increased risk of AKI with above regimen  Follow-up culture results  Height: 5\' 6"  (167.6 cm) Weight: 158 lb 11.7 oz (72 kg) IBW/kg (Calculated) : 59.3  Temp (24hrs), Avg:98.1 F (36.7 C), Min:98 F (36.7 C), Max:98.2 F (36.8 C)   Recent Labs Lab 09/04/17 1325 09/04/17 1326 09/04/17 1658 09/04/17 1710 09/04/17 2111 09/05/17 0437 09/05/17 0937 09/06/17 0406 09/07/17 0708  WBC 0.8*  --  0.7*  --   --  0.3*  --  1.4* 9.0  CREATININE  --  0.9 0.76  --   --  0.83  --  0.75 0.68  0.72  LATICACIDVEN  --   --   --  2.34* 3.3* 3.2* 2.4*  --   --   VANCOTROUGH  --   --   --   --   --   --   --   --  13*    Estimated Creatinine Clearance: 96 mL/min (by C-G formula based on SCr of 0.68 mg/dL).    No Known Allergies  Antimicrobials this admission: 10/18 vancomycin >>  10/18 zosyn >>    Dose adjustments this admission: 10/21: VT = 13 (drawn about 8.5 hrs after dose) on 750mg  q8h, cont same.   Microbiology results: 10/18 BCx: ngtd 10/18 influenza panel: A/B negative 10/18 C. Difficile: neg/neg 10/19 GI panel: neg  Thank you for allowing pharmacy to be a part of this patient's care.  Romeo Rabon, PharmD. Mobile: [phone]. 09/07/2017,10:57 AM.

## 2017-09-07 NOTE — Progress Notes (Signed)
[facility] PROGRESS NOTE  Anaaya Fuster GUR:[id number] DOB: 11/07/1976 DOA: 09/04/2017  PCP: [name], MD  Brief History/Interval Summary: 41 year old Caucasian female with a past medical history of breast cancer status post bilateral mastectomy, who started chemotherapy with cycle on October 12, presented with fever, weakness, dizziness.She was found to be febrile. She was found to be neutropenic. She was hospitalized for further management.  Reason for Visit: Neutropenic fever, sepsis  Consultants: Oncology  Procedures: none  Antibiotics: Vancomycin and Zosyn  Subjective/Interval History: Patient states that she is feeling better.  Has not had any fever.  Nausea vomiting appears to have subsided with Phenergan.  Continues to have loose stool but not as much as the day before.  Abdominal pain appears to have subsided.    ROS: Denies any headaches.  Objective:  Vital Signs  Vitals:   09/06/17 0501 09/06/17 1350 09/06/17 2101 09/07/17 0527  BP: (!) 92/56 (!) 98/59 (!) 91/49 104/64  Pulse: 95 (!) 101 94 84  Resp: 16 16 18 16   Temp: 97.9 F (36.6 C) 98.1 F (36.7 C) 98.2 F (36.8 C) 98 F (36.7 C)  TempSrc: Oral Oral Oral Oral  SpO2: 98% 99% 97% 98%  Weight:      Height:        Intake/Output Summary (Last 24 hours) at 09/07/17 1125 Last data filed at 09/07/17 0600  Gross per 24 hour  Intake          2741.67 ml  Output              500 ml  Net          2241.67 ml   Filed Weights   09/04/17 1652 09/04/17 2021  Weight: 71.7 kg (158 lb) 72 kg (158 lb 11.7 oz)    General appearance: She is awake and alert.  In no distress. Resp: Clear to auscultation bilaterally Cardio: S1-S2 is normal regular.  No S3-S4.  No rubs murmurs or bruit GI: Abdomen is soft.  Nontender today.  No masses organomegaly.  Bowel sounds are present.   Extremities: No edema Neurologic: Awake and alert.  Oriented x3.  No focal neurological deficit  Lab Results:  Data  Reviewed: I have personally reviewed following labs and imaging studies  CBC:  Recent Labs Lab 09/04/17 1325 09/04/17 1658 09/05/17 0437 09/06/17 0406 09/07/17 0708  WBC 0.8* 0.7* 0.3* 1.4* 9.0  NEUTROABS 0.0* 0.0* 0.0* 0.3* 5.8  HGB 12.4 11.6* 10.0* 9.0* 9.0*  HCT 36.8 34.3* 29.4* 25.7* 26.8*  MCV 91.5 89.6 90.5 89.2 89.3  PLT 154 165 148* 133* 136*    Basic Metabolic Panel:  Recent Labs Lab 09/04/17 1326 09/04/17 1658 09/05/17 0437 09/06/17 0406 09/07/17 0708  NA 134* 132* 134* 139 140  K 4.1 3.8 3.9 3.2* 3.2*  CL  --  98* 103 107 105  CO2 25 23 23 27 27   GLUCOSE 137 110* 159* 170* 124*  BUN 14.1 11 9  5* <5*  CREATININE 0.9 0.76 0.83 0.75 0.68  0.72  CALCIUM 8.9 8.4* 7.5* 7.7* 7.9*  MG  --   --   --   --  2.0    GFR: Estimated Creatinine Clearance: 96 mL/min (by C-G formula based on SCr of 0.68 mg/dL).  Liver Function Tests:  Recent Labs Lab 09/04/17 1326 09/04/17 1658 09/05/17 0437  AST 12 19 24   ALT 13 16 21   ALKPHOS 57 52 43  BILITOT 1.00 1.2 0.9  PROT 6.8 6.5 5.7*  ALBUMIN 3.8 3.5 2.9*    Coagulation Profile:  Recent Labs Lab 09/04/17 2111  INR 1.10     Recent Results (from the past 240 hour(s))  Culture, Blood     Status: None (Preliminary result)   Collection Time: 09/04/17  3:06 PM  Result Value Ref Range Status   BLOOD CULTURE, ROUTINE Preliminary report  Preliminary   Organism ID, Bacteria Comment  Preliminary    Comment: No growth in 36 - 48 hours.  Culture, Blood     Status: None (Preliminary result)   Collection Time: 09/04/17  3:06 PM  Result Value Ref Range Status   BLOOD CULTURE, ROUTINE Preliminary report  Preliminary   Organism ID, Bacteria Comment  Preliminary    Comment: No growth in 36 - 48 hours.  Blood Culture (routine x 2)     Status: None (Preliminary result)   Collection Time: 09/04/17  5:25 PM  Result Value Ref Range Status   Specimen Description BLOOD LEFT ANTECUBITAL  Final   Special Requests   Final     BOTTLES DRAWN AEROBIC AND ANAEROBIC Blood Culture adequate volume   Culture   Final    NO GROWTH 2 DAYS Performed at [facility], [address], [place], [place] [address]    Report Status PENDING  Incomplete  Blood Culture (routine x 2)     Status: None (Preliminary result)   Collection Time: 09/04/17 11:30 PM  Result Value Ref Range Status   Specimen Description BLOOD RIGHT HAND  Final   Special Requests   Final    BOTTLES DRAWN AEROBIC AND ANAEROBIC Blood Culture adequate volume   Culture   Final    NO GROWTH 1 DAY Performed at [facility], [place] [address] [place], [place] [address]    Report Status PENDING  Incomplete  Gastrointestinal Panel by PCR , Stool     Status: None   Collection Time: 09/05/17  3:55 AM  Result Value Ref Range Status   Campylobacter species NOT DETECTED NOT DETECTED Final   Plesimonas shigelloides NOT DETECTED NOT DETECTED Final   Salmonella species NOT DETECTED NOT DETECTED Final   Yersinia enterocolitica NOT DETECTED NOT DETECTED Final   Vibrio species NOT DETECTED NOT DETECTED Final   Vibrio cholerae NOT DETECTED NOT DETECTED Final   Enteroaggregative E coli (EAEC) NOT DETECTED NOT DETECTED Final   Enteropathogenic E coli (EPEC) NOT DETECTED NOT DETECTED Final   Enterotoxigenic E coli (ETEC) NOT DETECTED NOT DETECTED Final   Shiga like toxin producing E coli (STEC) NOT DETECTED NOT DETECTED Final   Shigella/Enteroinvasive E coli (EIEC) NOT DETECTED NOT DETECTED Final   Cryptosporidium NOT DETECTED NOT DETECTED Final   Cyclospora cayetanensis NOT DETECTED NOT DETECTED Final   Entamoeba histolytica NOT DETECTED NOT DETECTED Final   Giardia lamblia NOT DETECTED NOT DETECTED Final   Adenovirus F40/41 NOT DETECTED NOT DETECTED Final   Astrovirus NOT DETECTED NOT DETECTED Final   Norovirus GI/GII NOT DETECTED NOT DETECTED Final   Rotavirus A NOT DETECTED NOT DETECTED Final   Sapovirus (I, II, IV, and V) NOT DETECTED NOT DETECTED Final  C  difficile quick scan w PCR reflex     Status: None   Collection Time: 09/05/17  3:55 AM  Result Value Ref Range Status   C Diff antigen NEGATIVE NEGATIVE Final   C Diff toxin NEGATIVE NEGATIVE Final   C Diff interpretation No C. difficile detected.  Final      Radiology Studies:  Ct Abdomen Pelvis W Contrast  Result Date: 09/05/2017 CLINICAL DATA:  Acute generalized abdominal pain. History of breast cancer. EXAM: CT ABDOMEN AND PELVIS WITH CONTRAST TECHNIQUE: Multidetector CT imaging of the abdomen and pelvis was performed using the standard protocol following bolus administration of intravenous contrast. CONTRAST:  140mL ISOVUE-300 IOPAMIDOL (ISOVUE-300) INJECTION 61% COMPARISON:  MRI of August 25, 2017.  CT scan of July 02, 2017. FINDINGS: Lower chest: No acute abnormality. Hepatobiliary: No gallstones are noted. Stable 7 mm hyperdensity is noted posteriorly in right hepatic lobe most consistent with hemangioma based on recent MRI. No other abnormality seen in the hepatic parenchyma. Pancreas: Unremarkable. No pancreatic ductal dilatation or surrounding inflammatory changes. Spleen: Normal in size without focal abnormality. Adrenals/Urinary Tract: Adrenal glands are unremarkable. Kidneys are normal, without renal calculi, focal lesion, or hydronephrosis. Bladder is unremarkable. Stomach/Bowel: Stomach is within normal limits. Appendix appears normal. No evidence of bowel wall thickening, distention, or inflammatory changes. Vascular/Lymphatic: No significant vascular findings are present. No enlarged abdominal or pelvic lymph nodes. Reproductive: Uterus and left ovary unremarkable. 2.4 cm involuting cyst is noted in right ovary. Other: No abdominal wall hernia or abnormality. No abdominopelvic ascites. Musculoskeletal: No acute or significant osseous findings. IMPRESSION: Stable 7 mm hyperdensity seen in right hepatic lobe most consistent with hemangioma based on recent MRI. 2.4 cm right ovarian  involuting cyst is noted. No other abnormality seen in the abdomen or pelvis. Electronically Signed   By: Marijo Conception, M.D.   On: 09/05/2017 16:20     Medications:  Scheduled: . enoxaparin (LOVENOX) injection  40 mg Subcutaneous Q24H  . feeding supplement (ENSURE ENLIVE)  237 mL Oral BID BM  . fluticasone  2 spray Each Nare Daily  . hydrocortisone sod succinate (SOLU-CORTEF) inj  50 mg Intravenous Q8H  . potassium chloride  40 mEq Oral Q4H  . Tbo-filgastrim (GRANIX) SQ  480 mcg Subcutaneous q1800   Continuous: . sodium chloride 75 mL/hr at 09/07/17 4098   JXB:[id number] **OR** acetaminophen, iopamidol, iopamidol, loperamide, magic mouthwash, morphine injection, ondansetron **OR** ondansetron (ZOFRAN) IV, promethazine, sodium chloride flush  Assessment/Plan:  Principal Problem:   Sepsis ([place]) Active Problems:   Malignant neoplasm of upper-outer quadrant of right breast in female, estrogen receptor positive ([place])   Port-A-Cath in place   Neutropenic fever ([facility])   Hyponatremia   Malignant neoplasm of breast in female, estrogen receptor positive ([place])    Sepsis/neutropenic fever  Etiology/source of infection unclear so far. The erythematous area around her Port-A-Cath is stable and thought to be due to reaction to tape.  Influenza panel was negative.  Stool studies including C. difficile and GI pathogen panel unremarkable.  Lactic acid level was elevated and has improved.  Blood cultures negative so far.  CT scan of the abdomen pelvis without any significant abnormalities except for an ovarian cyst on the right.  Pro-calcitonin level was less than 0.1.  Since absolute neutrophil count has significantly improved we will discontinue her antibiotics and monitor.  Absolute neutrophil count improved to 5000.  Oncology recommends continuing filgrastim until [place] is greater than 1000 for 2 consecutive days.  Imodium for diarrhea.  Symptomatic treatment for nausea.  Overall she seems  better.  She will need to ambulate.  Discussed with nursing staff.    History of breast cancer status post bilateral mastectomy. Followed by oncology. She started her chemotherapy on October 12. She's had 1 cycle so far with Taxotere, carboplatin, Herceptin, and perjeta. She also received dexamethasone along with  this chemotherapy. She's currently on stress dose steroids with hydrocortisone.  Start tapering.  Patient has borderline low blood pressures at baseline.  Hyponatremia/hypokalemia Sodium level is normal. Potassium will be repleted aggressively.  Cut back on IV fluids.  Hyperglycemia Secondary to steroids.  Should improve steroid is tapered down.  No history of diabetes.  Normocytic anemia. No evidence for overt bleeding.  Mild drop in hemoglobin is noted.  Continue to monitor for now.   DVT Prophylaxis: Lovenox    Code Status: full code  Family Communication: Discussed with the patient and her husband Disposition Plan: Management as outlined above.  Continue to mobilize.  Replace potassium.    LOS: 3 days   [facility] Pager [phone] 09/07/2017, 11:25 AM  If 7PM-7AM, please contact night-coverage at [email or web address], password [facility]

## 2017-09-07 NOTE — Progress Notes (Signed)
HEMATOLOGY/ONCOLOGY INPATIENT PROGRESS NOTE  Date of Service: 09/07/2017  Inpatient Attending: .Bonnielee Haff, MD   SUBJECTIVE  Patient was seen in follow-up today. Notes no significant new fevers. Neutropenia has now resolved and she is having a robust response to granix and appears to be on the upswing. Some lower abdominal cramping from her periods. Diarrhea is resolving. Overall feels somewhat better.  ANC is up to 5800. Continues to be on broad spectrum antibiotics. No specific positive cultures at this time.   OBJECTIVE:  NAD  PHYSICAL EXAMINATION: . Vitals:   09/06/17 0501 09/06/17 1350 09/06/17 2101 09/07/17 0527  BP: (!) 92/56 (!) 98/59 (!) 91/49 104/64  Pulse: 95 (!) 101 94 84  Resp: '16 16 18 16  ' Temp: 97.9 F (36.6 C) 98.1 F (36.7 C) 98.2 F (36.8 C) 98 F (36.7 C)  TempSrc: Oral Oral Oral Oral  SpO2: 98% 99% 97% 98%  Weight:      Height:       Filed Weights   09/04/17 1652 09/04/17 2021  Weight: 158 lb (71.7 kg) 158 lb 11.7 oz (72 kg)   .Body mass index is 25.62 kg/m.  GENERAL:alert, in no acute distress and comfortable SKIN: skin color, texture, turgor are normal, no rashes or significant lesions EYES: normal, conjunctiva are pink and non-injected, sclera clear OROPHARYNX:no exudate, no erythema and lips, buccal mucosa, and tongue normal  NECK: supple, no JVD, thyroid normal size, non-tender, without nodularity LYMPH:  no palpable lymphadenopathy in the cervical, axillary or inguinal LUNGS: clear to auscultation with normal respiratory effort HEART: regular rate & rhythm,  no murmurs and no lower extremity edema ABDOMEN: abdomen soft, minimal non focal TTP, no guarding/rigidity/rebound Musculoskeletal: no cyanosis of digits and no clubbing  PSYCH: alert & oriented x 3 with fluent speech NEURO: no focal motor/sensory deficits  MEDICAL HISTORY:  Past Medical History:  Diagnosis Date  . Anxiety   . Cancer [facility])    right breast   .  Family history of breast cancer   . Family history of colon cancer   . GERD (gastroesophageal reflux disease)   . Headache    couple of migraines in the past  . History of kidney stones   . Vitamin D deficiency     SURGICAL HISTORY: Past Surgical History:  Procedure Laterality Date  . egg donation    . kidney stone removal    . MASTECTOMY W/ SENTINEL NODE BIOPSY Bilateral 07/22/2017   Procedure: RIGHT TOTAL MASTECTOMY WITH RIGHT AXILLARY SENTINEL LYMPH NODE BIOPSY, LEFT PROPHYLACTIC MASTECTOMY;  Surgeon: [name], MD;  Location: [facility];  Service: General;  Laterality: Bilateral;  . PORTACATH PLACEMENT Right 07/22/2017   Procedure: INSERTION PORT-A-CATH WITH [name];  Surgeon: [name], MD;  Location: [place];  Service: General;  Laterality: Right;    SOCIAL HISTORY: Social History   Social History  . Marital status: Single    Spouse name: N/A  . Number of children: N/A  . Years of education: N/A   Occupational History  . Not on file.   Social History Main Topics  . Smoking status: Never Smoker  . Smokeless tobacco: Former Systems developer  . Alcohol use Yes     Comment: once a year  . Drug use: No  . Sexual activity: Yes   Other Topics Concern  . Not on file   Social History Narrative  . No narrative on file    FAMILY HISTORY: Family History  Problem Relation Age of Onset  .  Breast cancer Paternal Grandmother 34  . Colon cancer Paternal Grandmother 15  . Breast cancer Mother 68  . Prostate cancer Paternal Grandfather        dx in his 28s  . Leukemia Paternal Grandfather   . Lung cancer Maternal Uncle        heavy smoker  . Other Paternal Uncle        farm accident  . Stroke Maternal Grandfather     ALLERGIES:  has No Known Allergies.  MEDICATIONS:  Scheduled Meds: . enoxaparin (LOVENOX) injection  40 mg Subcutaneous Q24H  . feeding supplement (ENSURE ENLIVE)  237 mL Oral BID BM  . fluticasone  2 spray Each Nare Daily  . hydrocortisone sod succinate  (SOLU-CORTEF) inj  50 mg Intravenous Q8H  . potassium chloride  40 mEq Oral Q4H  . Tbo-filgastrim (GRANIX) SQ  480 mcg Subcutaneous q1800   Continuous Infusions: . sodium chloride 75 mL/hr at 09/07/17 0512   PRN Meds:.acetaminophen **OR** acetaminophen, iopamidol, iopamidol, loperamide, magic mouthwash, morphine injection, ondansetron **OR** ondansetron (ZOFRAN) IV, promethazine, sodium chloride flush  REVIEW OF SYSTEMS:    10 Point review of Systems was done is negative except as noted above.   LABORATORY DATA:  I have reviewed the data as listed  . CBC Latest Ref Rng & Units 09/07/2017 09/06/2017 09/05/2017  WBC 4.0 - 10.5 K/uL 9.0 1.4(LL) 0.3(LL)  Hemoglobin 12.0 - 15.0 g/dL 9.0(L) 9.0(L) 10.0(L)  Hematocrit 36.0 - 46.0 % 26.8(L) 25.7(L) 29.4(L)  Platelets 150 - 400 K/uL 136(L) 133(L) 148(L)   ANC 300 . CMP Latest Ref Rng & Units 09/07/2017 09/07/2017 09/06/2017  Glucose 65 - 99 mg/dL 124(H) - 170(H)  BUN 6 - 20 mg/dL <5(L) - 5(L)  Creatinine 0.44 - 1.00 mg/dL 0.68 0.72 0.75  Sodium 135 - 145 mmol/L 140 - 139  Potassium 3.5 - 5.1 mmol/L 3.2(L) - 3.2(L)  Chloride 101 - 111 mmol/L 105 - 107  CO2 22 - 32 mmol/L 27 - 27  Calcium 8.9 - 10.3 mg/dL 7.9(L) - 7.7(L)  Total Protein 6.5 - 8.1 g/dL - - -  Total Bilirubin 0.3 - 1.2 mg/dL - - -  Alkaline Phos 38 - 126 U/L - - -  AST 15 - 41 U/L - - -  ALT 14 - 54 U/L - - -     RADIOGRAPHIC STUDIES: I have personally reviewed the radiological images as listed and agreed with the findings in the report. Ct Abdomen Pelvis W Contrast  Result Date: 09/05/2017 CLINICAL DATA:  Acute generalized abdominal pain. History of breast cancer. EXAM: CT ABDOMEN AND PELVIS WITH CONTRAST TECHNIQUE: Multidetector CT imaging of the abdomen and pelvis was performed using the standard protocol following bolus administration of intravenous contrast. CONTRAST:  175m ISOVUE-300 IOPAMIDOL (ISOVUE-300) INJECTION 61% COMPARISON:  MRI of August 25, 2017.   CT scan of July 02, 2017. FINDINGS: Lower chest: No acute abnormality. Hepatobiliary: No gallstones are noted. Stable 7 mm hyperdensity is noted posteriorly in right hepatic lobe most consistent with hemangioma based on recent MRI. No other abnormality seen in the hepatic parenchyma. Pancreas: Unremarkable. No pancreatic ductal dilatation or surrounding inflammatory changes. Spleen: Normal in size without focal abnormality. Adrenals/Urinary Tract: Adrenal glands are unremarkable. Kidneys are normal, without renal calculi, focal lesion, or hydronephrosis. Bladder is unremarkable. Stomach/Bowel: Stomach is within normal limits. Appendix appears normal. No evidence of bowel wall thickening, distention, or inflammatory changes. Vascular/Lymphatic: No significant vascular findings are present. No enlarged abdominal or pelvic lymph nodes.  Reproductive: Uterus and left ovary unremarkable. 2.4 cm involuting cyst is noted in right ovary. Other: No abdominal wall hernia or abnormality. No abdominopelvic ascites. Musculoskeletal: No acute or significant osseous findings. IMPRESSION: Stable 7 mm hyperdensity seen in right hepatic lobe most consistent with hemangioma based on recent MRI. 2.4 cm right ovarian involuting cyst is noted. No other abnormality seen in the abdomen or pelvis. Electronically Signed   By: Marijo Conception, M.D.   On: 09/05/2017 16:20   Dg Chest Port 1 View  Result Date: 09/04/2017 CLINICAL DATA:  Breast cancer, first chemotherapy treatment 6 days ago. Fever, hypotensive and tachycardic. EXAM: PORTABLE CHEST 1 VIEW COMPARISON:  Chest radiograph July 22, 2017 FINDINGS: Cardiomediastinal silhouette is normal. No pleural effusions or focal consolidations. Trachea projects midline and there is no pneumothorax. Soft tissue planes and included osseous structures are non-suspicious. Single lumen RIGHT chest Port-A-Cath distal tip projects in mid superior vena cava. Bilateral mastectomies. Surgical clips  RIGHT axilla. IMPRESSION: No acute cardiopulmonary process. Electronically Signed   By: Elon Alas M.D.   On: 09/04/2017 17:19    ASSESSMENT & PLAN:   41 year old Caucasian female with  #1 Severe neutropenia with fevers and symptoms of sepsis. Now resolved ANC upto 5800 Neutropenia is related to her first cycle of adjuvant TCHP treatment on 08/29/2017.  We anticipate her counts should nadir between day 7 and day 14 and then would bounce back.  She received only 3 days of Granix post treatment and not Neulasta due to insurance approval issues.  No overt source of infection. Does seem to have some resolving oral mucositis and likely diarrhea from GI mucositis which could be the source of her fevers. Diarrhea could also be from the Perjeta CT of the abdomen and stool studies unrevealing  Blood cultures negative at this time Pro-calcitonin levels not elevated Influenza swab negative  #2 Locally advanced stage IIIA ER positive PR positive HER-2/neu positive breast cancer on the right side.  Status post right-sided mastectomy with right axillary lymph node dissection.  Prophylactic left-sided mastectomy. Follows with Dr. Burr Medico for medical oncology.  #3 anemia related to chemotherapy and sepsis no overt bleeding. Hgb stable today. #4 mild thrombocytopenia related to chemotherapy. stable #5 Diarrhea/abd discomfort - mucositis vs side effect of Perjeta -- resolving. PLAN  -patient is overall feeling well -ANC has improved from 0 to 300 to 5800. -would give her one additional dose of the granix this evening and then could stop if labs showing continued resolution of neutropenia tomorrow AM. -broad-spectrum antibiotic coverage with Zosyn and vancomycin as per hospitalist - could DC if cultures neg/neutropenia resolved and patient afebrile for 48hr -Will need to add Neulasta with her next cycle of treatment +/- chemotherapy dose adjustments.  Given profound neutropenia with fevers and  sepsis. (to be decided by Dr Burr Medico on f/u) -daily cbc with diff - can likely discharge tomorrow if [place] >1000 and no further fevers or other new issues. -will need f/u with Dr Lennie Odor NP within 5-7 days of discharge with rpt labs (message sent) -Appreciate excellent hospitalist cares.  Will sign off . Plz call if any questions.  I spent 20 minutes counseling the patient face to face. The total time spent in the appointment was 25 minutes and more than 50% was on counseling and direct patient cares.    Sullivan Lone MD [place] AAHIVMS [facility] Hematology/Oncology Physician [facility]  (Office):       [phone] (Work cell):  [phone] (  Fax):           [phone]  09/07/2017 12:54 PM

## 2017-09-08 ENCOUNTER — Encounter: Payer: PRIVATE HEALTH INSURANCE | Admitting: Physical Therapy

## 2017-09-08 LAB — CBC WITH DIFFERENTIAL/PLATELET
BASOS ABS: 0 10*3/uL (ref 0.0–0.1)
BASOS PCT: 0 %
Band Neutrophils: 15 %
Blasts: 0 %
EOS PCT: 0 %
Eosinophils Absolute: 0 10*3/uL (ref 0.0–0.7)
HCT: 24.7 % — ABNORMAL LOW (ref 36.0–46.0)
HEMOGLOBIN: 8.4 g/dL — AB (ref 12.0–15.0)
LYMPHS ABS: 3.5 10*3/uL (ref 0.7–4.0)
Lymphocytes Relative: 14 %
MCH: 30.9 pg (ref 26.0–34.0)
MCHC: 34 g/dL (ref 30.0–36.0)
MCV: 90.8 fL (ref 78.0–100.0)
METAMYELOCYTES PCT: 0 %
MONO ABS: 2.3 10*3/uL — AB (ref 0.1–1.0)
Monocytes Relative: 9 %
Myelocytes: 0 %
NEUTROS PCT: 62 %
NRBC: 0 /100{WBCs}
Neutro Abs: 19.3 10*3/uL — ABNORMAL HIGH (ref 1.7–7.7)
Other: 0 %
PLATELETS: 142 10*3/uL — AB (ref 150–400)
PROMYELOCYTES ABS: 0 %
RBC: 2.72 MIL/uL — AB (ref 3.87–5.11)
RDW: 12.8 % (ref 11.5–15.5)
WBC: 25.1 10*3/uL — AB (ref 4.0–10.5)

## 2017-09-08 LAB — BASIC METABOLIC PANEL
ANION GAP: 4 — AB (ref 5–15)
BUN: 8 mg/dL (ref 6–20)
CALCIUM: 7.9 mg/dL — AB (ref 8.9–10.3)
CO2: 30 mmol/L (ref 22–32)
Chloride: 111 mmol/L (ref 101–111)
Creatinine, Ser: 0.97 mg/dL (ref 0.44–1.00)
GFR calc Af Amer: >60 mL/min (ref >60)
GLUCOSE: 164 mg/dL — AB (ref 65–99)
POTASSIUM: 3 mmol/L — AB (ref 3.5–5.1)
SODIUM: 145 mmol/L (ref 135–145)

## 2017-09-08 MED ORDER — IBUPROFEN 200 MG PO TABS
400.0000 mg | ORAL_TABLET | Freq: Once | ORAL | Status: AC
  Administered 2017-09-08: 400 mg via ORAL
  Filled 2017-09-08: qty 2

## 2017-09-08 MED ORDER — HYDROCORTISONE NA SUCCINATE PF 100 MG IJ SOLR
50.0000 mg | Freq: Two times a day (BID) | INTRAMUSCULAR | Status: DC
  Administered 2017-09-08 – 2017-09-09 (×2): 50 mg via INTRAVENOUS
  Filled 2017-09-08: qty 2

## 2017-09-08 MED ORDER — POTASSIUM CHLORIDE CRYS ER 20 MEQ PO TBCR
40.0000 meq | EXTENDED_RELEASE_TABLET | ORAL | Status: AC
  Administered 2017-09-08 (×2): 40 meq via ORAL
  Filled 2017-09-08 (×2): qty 2

## 2017-09-08 NOTE — Progress Notes (Signed)
[facility] PROGRESS NOTE  Sheila [name] FVC:[id number] DOB: [birth year] DOA: 09/04/2017  PCP: [name], MD  Brief History/Interval Summary: 41 year old Caucasian female with a past medical history of breast cancer status post bilateral mastectomy, who started chemotherapy with cycle on October 12, presented with fever, weakness, dizziness.She was found to be febrile. She was found to be neutropenic. She was hospitalized for further management.  Reason for Visit: Neutropenic fever, sepsis  Consultants: Oncology  Procedures: none  Antibiotics: Vancomycin and Zosyn.  Discontinued on 10/21  Subjective/Interval History: Patient feels well overall.  Some nausea this morning.  No vomiting.  Diarrhea is subsiding.  Abdominal pain has resolved.  No new complaints.      ROS: Denies any headaches.  Objective:  Vital Signs  Vitals:   09/07/17 0527 09/07/17 1544 09/07/17 2217 09/08/17 0508  BP: 104/64 (!) 98/44 (!) 97/52 (!) 105/58  Pulse: 84 96 90 91  Resp: 16 18 18 16   Temp: 98 F (36.7 C) 99 F (37.2 C) 98.2 F (36.8 C) 97.7 F (36.5 C)  TempSrc: Oral Oral Oral Oral  SpO2: 98% 98% 99% 100%  Weight:      Height:        Intake/Output Summary (Last 24 hours) at 09/08/17 1028 Last data filed at 09/07/17 1545  Gross per 24 hour  Intake              900 ml  Output                0 ml  Net              900 ml   Filed Weights   09/04/17 1652 09/04/17 2021  Weight: 71.7 kg (158 lb) 72 kg (158 lb 11.7 oz)    General appearance: Awake and alert.  In no distress. Resp: Clear to auscultation bilaterally.  No wheezing rales or rhonchi Cardio: S1-S2 is normal regular.  No S3-S4.  No rubs murmurs or bruit GI:.  Nondistended.  Nontender.  No masses organomegaly. Extremities: No edema Neurologic: Awake and alert.  Oriented x3.  No focal neurological deficit  Lab Results:  Data Reviewed: I have personally reviewed following labs and imaging  studies  CBC:  Recent Labs Lab 09/04/17 1658 09/05/17 0437 09/06/17 0406 09/07/17 0708 09/08/17 0312  WBC 0.7* 0.3* 1.4* 9.0 25.1*  NEUTROABS 0.0* 0.0* 0.3* 5.8 19.3*  HGB 11.6* 10.0* 9.0* 9.0* 8.4*  HCT 34.3* 29.4* 25.7* 26.8* 24.7*  MCV 89.6 90.5 89.2 89.3 90.8  PLT 165 148* 133* 136* 142*    Basic Metabolic Panel:  Recent Labs Lab 09/04/17 1658 09/05/17 0437 09/06/17 0406 09/07/17 0708 09/08/17 0312  NA 132* 134* 139 140 145  K 3.8 3.9 3.2* 3.2* 3.0*  CL 98* 103 107 105 111  CO2 23 23 27 27 30   GLUCOSE 110* 159* 170* 124* 164*  BUN 11 9 5* <5* 8  CREATININE 0.76 0.83 0.75 0.68  0.72 0.97  CALCIUM 8.4* 7.5* 7.7* 7.9* 7.9*  MG  --   --   --  2.0  --     GFR: Estimated Creatinine Clearance: 79.2 mL/min (by C-G formula based on SCr of 0.97 mg/dL).  Liver Function Tests:  Recent Labs Lab 09/04/17 1326 09/04/17 1658 09/05/17 0437  AST 12 19 24   ALT 13 16 21   ALKPHOS 57 52 43  BILITOT 1.00 1.2 0.9  PROT 6.8 6.5 5.7*  ALBUMIN 3.8 3.5 2.9*    Coagulation Profile:  Recent Labs Lab 09/04/17 2111  INR 1.10     Recent Results (from the past 240 hour(s))  Culture, Blood     Status: None (Preliminary result)   Collection Time: 09/04/17  3:06 PM  Result Value Ref Range Status   BLOOD CULTURE, ROUTINE Preliminary report  Preliminary   Organism ID, Bacteria Comment  Preliminary    Comment: No growth in 36 - 48 hours.  Culture, Blood     Status: None (Preliminary result)   Collection Time: 09/04/17  3:06 PM  Result Value Ref Range Status   BLOOD CULTURE, ROUTINE Preliminary report  Preliminary   Organism ID, Bacteria Comment  Preliminary    Comment: No growth in 36 - 48 hours.  Blood Culture (routine x 2)     Status: None (Preliminary result)   Collection Time: 09/04/17  5:25 PM  Result Value Ref Range Status   Specimen Description BLOOD LEFT ANTECUBITAL  Final   Special Requests   Final    BOTTLES DRAWN AEROBIC AND ANAEROBIC Blood Culture adequate  volume   Culture   Final    NO GROWTH 3 DAYS Performed at [facility], [address], [place], [place] [address]    Report Status PENDING  Incomplete  Blood Culture (routine x 2)     Status: None (Preliminary result)   Collection Time: 09/04/17 11:30 PM  Result Value Ref Range Status   Specimen Description BLOOD RIGHT HAND  Final   Special Requests   Final    BOTTLES DRAWN AEROBIC AND ANAEROBIC Blood Culture adequate volume   Culture   Final    NO GROWTH 2 DAYS Performed at [facility], [place] [address] [place], [place] [address]    Report Status PENDING  Incomplete  Gastrointestinal Panel by PCR , Stool     Status: None   Collection Time: 09/05/17  3:55 AM  Result Value Ref Range Status   Campylobacter species NOT DETECTED NOT DETECTED Final   Plesimonas shigelloides NOT DETECTED NOT DETECTED Final   Salmonella species NOT DETECTED NOT DETECTED Final   Yersinia enterocolitica NOT DETECTED NOT DETECTED Final   Vibrio species NOT DETECTED NOT DETECTED Final   Vibrio cholerae NOT DETECTED NOT DETECTED Final   Enteroaggregative E coli (EAEC) NOT DETECTED NOT DETECTED Final   Enteropathogenic E coli (EPEC) NOT DETECTED NOT DETECTED Final   Enterotoxigenic E coli (ETEC) NOT DETECTED NOT DETECTED Final   Shiga like toxin producing E coli (STEC) NOT DETECTED NOT DETECTED Final   Shigella/Enteroinvasive E coli (EIEC) NOT DETECTED NOT DETECTED Final   Cryptosporidium NOT DETECTED NOT DETECTED Final   Cyclospora cayetanensis NOT DETECTED NOT DETECTED Final   Entamoeba histolytica NOT DETECTED NOT DETECTED Final   Giardia lamblia NOT DETECTED NOT DETECTED Final   Adenovirus F40/41 NOT DETECTED NOT DETECTED Final   Astrovirus NOT DETECTED NOT DETECTED Final   Norovirus GI/GII NOT DETECTED NOT DETECTED Final   Rotavirus A NOT DETECTED NOT DETECTED Final   Sapovirus (I, II, IV, and V) NOT DETECTED NOT DETECTED Final  C difficile quick scan w PCR reflex     Status: None    Collection Time: 09/05/17  3:55 AM  Result Value Ref Range Status   C Diff antigen NEGATIVE NEGATIVE Final   C Diff toxin NEGATIVE NEGATIVE Final   C Diff interpretation No C. difficile detected.  Final      Radiology Studies: No results found.   Medications:  Scheduled: . enoxaparin (  LOVENOX) injection  40 mg Subcutaneous Q24H  . feeding supplement (ENSURE ENLIVE)  237 mL Oral BID BM  . fluticasone  2 spray Each Nare Daily  . hydrocortisone sod succinate (SOLU-CORTEF) inj  50 mg Intravenous Q8H  . potassium chloride  40 mEq Oral Q4H   Continuous: . sodium chloride 10 mL/hr at 09/07/17 1409   HKV:[id number] **OR** acetaminophen, iopamidol, iopamidol, lip balm, loperamide, magic mouthwash, morphine injection, ondansetron **OR** ondansetron (ZOFRAN) IV, promethazine, sodium chloride flush  Assessment/Plan:  Principal Problem:   Sepsis ([place]) Active Problems:   Malignant neoplasm of upper-outer quadrant of right breast in female, estrogen receptor positive ([place])   Port-A-Cath in place   Neutropenic fever ([place])   Hyponatremia   Malignant neoplasm of breast in female, estrogen receptor positive ([place])    Sepsis/neutropenic fever  Etiology/source of infection unclear. The erythematous area around her Port-A-Cath is stable and thought to be due to reaction to tape.  Influenza panel was negative.  Stool studies including C. difficile and GI pathogen panel unremarkable.  Lactic acid level was elevated and improved.  Blood cultures negative so far.  CT scan of the abdomen pelvis without any significant abnormalities except for an ovarian cyst on the right.  Pro-calcitonin level was less than 0.1.  Patient was initially started on broad-spectrum antibiotics which were discontinued on 10/21.  Patient remains afebrile.  WBC now elevated and likely due to filgrastim.  Filgrastim was discontinued yesterday diarrhea appears to be subsiding.  Symptomatic treatment for nausea.  Mobilize.   Discussed with nursing staff.     History of breast cancer status post bilateral mastectomy. Followed by oncology. She started her chemotherapy on October 12. She's had 1 cycle so far with Taxotere, carboplatin, Herceptin, and perjeta. She also received dexamethasone along with this chemotherapy.  She was placed on stress dose steroids.  Continue to taper.  Patient has borderline low blood pressures at baseline.  Hyponatremia/hypokalemia Sodium level is normal.  Potassium remains low despite repletion.  Magnesium level was normal.  She will be given additional doses today.    Hyperglycemia Secondary to steroids.  Should improve as steroid is tapered down.  No history of diabetes.  Normocytic anemia. No evidence for overt bleeding.  Mild drop in hemoglobin is noted.  Continue to monitor for now.   DVT Prophylaxis: Lovenox    Code Status: full code  Family Communication: Discussed with the patient and her husband Disposition Plan: Replace potassium.  Mobilize.  Management as outlined above.  Anticipate discharge in 24-48 hours.    LOS: 4 days   [facility] Pager [phone] 09/08/2017, 10:28 AM  If 7PM-7AM, please contact night-coverage at [email or web address], password [facility]

## 2017-09-08 NOTE — Progress Notes (Signed)
Sheila [name]   BVQ:[birth year]8   EK#:[id number]   XTA#:[id number]  Subjective: Patient was sleeping upon entry into her room. Her abdominal pain has resolved but has some residual nausea. No new fever or rash. She is having difficulty swallowing oral K tablet.    Objective:  Vitals:   09/07/17 2217 09/08/17 0508  BP: (!) 97/52 (!) 105/58  Pulse: 90 91  Resp: 18 16  Temp: 98.2 F (36.8 C) 97.7 F (36.5 C)  SpO2: 99% 100%    Body mass index is 25.62 kg/m.  Intake/Output Summary (Last 24 hours) at 09/08/17 1658 Last data filed at 09/08/17 1300  Gross per 24 hour  Intake           699.08 ml  Output                0 ml  Net           699.08 ml     Sclerae anicteric  Oropharynx clear  No peripheral adenopathy  Lungs clear -- no rales or rhonchi  Heart regular rate and rhythm  Abdomen benign  MSK no peripheral edema  CBG (last 3)  No results for input(s): GLUCAP in the last 72 hours.   Labs:  Lab Results  Component Value Date   WBC 25.1 (H) 09/08/2017   HGB 8.4 (L) 09/08/2017   HCT 24.7 (L) 09/08/2017   MCV 90.8 09/08/2017   PLT 142 (L) 09/08/2017   NEUTROABS 19.3 (H) 09/08/2017    Urine Studies No results for input(s): UHGB, CRYS in the last 72 hours.  Invalid input(s): UACOL, UAPR, USPG, UPH, UTP, UGL, UKET, UBIL, UNIT, UROB, [place], UEPI, UWBC, Duwayne Heck [place], [place]  Basic Metabolic Panel:  Recent Labs Lab 09/04/17 1658 09/05/17 0437 09/06/17 0406 09/07/17 0708 09/08/17 0312  NA 132* 134* 139 140 145  K 3.8 3.9 3.2* 3.2* 3.0*  CL 98* 103 107 105 111  CO2 '23 23 27 27 30  ' GLUCOSE 110* 159* 170* 124* 164*  BUN 11 9 5* <5* 8  CREATININE 0.76 0.83 0.75 0.68  0.72 0.97  CALCIUM 8.4* 7.5* 7.7* 7.9* 7.9*  MG  --   --   --  2.0  --    GFR Estimated Creatinine Clearance: 79.2 mL/min (by C-G formula based on SCr of 0.97 mg/dL). Liver Function Tests:  Recent Labs Lab 09/04/17 1326 09/04/17 1658 09/05/17 0437  AST '12 19 24  ' ALT '13 16 21   ' ALKPHOS 57 52 43  BILITOT 1.00 1.2 0.9  PROT 6.8 6.5 5.7*  ALBUMIN 3.8 3.5 2.9*   No results for input(s): LIPASE, AMYLASE in the last 168 hours. No results for input(s): AMMONIA in the last 168 hours. Coagulation profile  Recent Labs Lab 09/04/17 2111  INR 1.10    CBC:  Recent Labs Lab 09/04/17 1658 09/05/17 0437 09/06/17 0406 09/07/17 0708 09/08/17 0312  WBC 0.7* 0.3* 1.4* 9.0 25.1*  NEUTROABS 0.0* 0.0* 0.3* 5.8 19.3*  HGB 11.6* 10.0* 9.0* 9.0* 8.4*  HCT 34.3* 29.4* 25.7* 26.8* 24.7*  MCV 89.6 90.5 89.2 89.3 90.8  PLT 165 148* 133* 136* 142*   Cardiac Enzymes: No results for input(s): CKTOTAL, CKMB, CKMBINDEX, TROPONINI in the last 168 hours. BNP: Invalid input(s): POCBNP CBG: No results for input(s): GLUCAP in the last 168 hours. D-Dimer No results for input(s): DDIMER in the last 72 hours. Hgb A1c No results for input(s): HGBA1C in the last 72 hours. Lipid Profile No results for input(s):  CHOL, HDL, LDLCALC, TRIG, CHOLHDL, LDLDIRECT in the last 72 hours. Thyroid function studies No results for input(s): TSH, T4TOTAL, T3FREE, THYROIDAB in the last 72 hours.  Invalid input(s): FREET3 Anemia work up No results for input(s): VITAMINB12, FOLATE, FERRITIN, TIBC, IRON, RETICCTPCT in the last 72 hours. Microbiology Recent Results (from the past 240 hour(s))  Culture, Blood     Status: None (Preliminary result)   Collection Time: 09/04/17  3:06 PM  Result Value Ref Range Status   BLOOD CULTURE, ROUTINE Preliminary report  Preliminary   Organism ID, Bacteria Comment  Preliminary    Comment: No growth in 36 - 48 hours.  Culture, Blood     Status: None (Preliminary result)   Collection Time: 09/04/17  3:06 PM  Result Value Ref Range Status   BLOOD CULTURE, ROUTINE Preliminary report  Preliminary   Organism ID, Bacteria Comment  Preliminary    Comment: No growth in 36 - 48 hours.  Blood Culture (routine x 2)     Status: None (Preliminary result)   Collection  Time: 09/04/17  5:25 PM  Result Value Ref Range Status   Specimen Description BLOOD LEFT ANTECUBITAL  Final   Special Requests   Final    BOTTLES DRAWN AEROBIC AND ANAEROBIC Blood Culture adequate volume   Culture   Final    NO GROWTH 4 DAYS Performed at [facility], [address], [place], [place] [address]    Report Status PENDING  Incomplete  Blood Culture (routine x 2)     Status: None (Preliminary result)   Collection Time: 09/04/17 11:30 PM  Result Value Ref Range Status   Specimen Description BLOOD RIGHT HAND  Final   Special Requests   Final    BOTTLES DRAWN AEROBIC AND ANAEROBIC Blood Culture adequate volume   Culture   Final    NO GROWTH 3 DAYS Performed at [facility], [place] [address], [place], [place] [address]    Report Status PENDING  Incomplete  Gastrointestinal Panel by PCR , Stool     Status: None   Collection Time: 09/05/17  3:55 AM  Result Value Ref Range Status   Campylobacter species NOT DETECTED NOT DETECTED Final   Plesimonas shigelloides NOT DETECTED NOT DETECTED Final   Salmonella species NOT DETECTED NOT DETECTED Final   Yersinia enterocolitica NOT DETECTED NOT DETECTED Final   Vibrio species NOT DETECTED NOT DETECTED Final   Vibrio cholerae NOT DETECTED NOT DETECTED Final   Enteroaggregative E coli (EAEC) NOT DETECTED NOT DETECTED Final   Enteropathogenic E coli (EPEC) NOT DETECTED NOT DETECTED Final   Enterotoxigenic E coli (ETEC) NOT DETECTED NOT DETECTED Final   Shiga like toxin producing E coli (STEC) NOT DETECTED NOT DETECTED Final   Shigella/Enteroinvasive E coli (EIEC) NOT DETECTED NOT DETECTED Final   Cryptosporidium NOT DETECTED NOT DETECTED Final   Cyclospora cayetanensis NOT DETECTED NOT DETECTED Final   Entamoeba histolytica NOT DETECTED NOT DETECTED Final   Giardia lamblia NOT DETECTED NOT DETECTED Final   Adenovirus F40/41 NOT DETECTED NOT DETECTED Final   Astrovirus NOT DETECTED NOT DETECTED Final   Norovirus GI/GII NOT  DETECTED NOT DETECTED Final   Rotavirus A NOT DETECTED NOT DETECTED Final   Sapovirus (I, II, IV, and V) NOT DETECTED NOT DETECTED Final  C difficile quick scan w PCR reflex     Status: None   Collection Time: 09/05/17  3:55 AM  Result Value Ref Range Status   C Diff antigen NEGATIVE  NEGATIVE Final   C Diff toxin NEGATIVE NEGATIVE Final   C Diff interpretation No C. difficile detected.  Final    Studies:  No results found.  Assessment: 41 y.o.   1. Neutropenic fever 2. Stage IIIA ER/PR positive, HER2 positive right breast cancer 3. Anemia  4. Diarrhea, abdominal pain 5. hypokalemia  She is overall feeling well, afebrile now with recovering counts. ABX discontinued. Abdominal pain and diarrhea are resolved. She has mild nausea managed with phenergan. She has been up in room and ambulating. She is on oral K replacement but having difficulty swallowing pills, consider liquid if she cannot tolerate. She will f/u in our clinic next week. Likely will get neulasta with cycle 2 [facility], insurance did not approve with cycle 1. Appreciate hospitalist and on-call oncology excellent care of this patient.   Plan:  -consider liquid K if cannot tolerate pill -d/c per hospitalist -f/u outpatient next week   [name], NP 09/08/2017  4:58 PM

## 2017-09-09 ENCOUNTER — Other Ambulatory Visit: Payer: Self-pay | Admitting: Hematology

## 2017-09-09 LAB — CBC WITH DIFFERENTIAL/PLATELET
BASOS ABS: 0 10*3/uL (ref 0.0–0.1)
BASOS PCT: 0 %
Eosinophils Absolute: 0 10*3/uL (ref 0.0–0.7)
Eosinophils Relative: 0 %
HCT: 23.7 % — ABNORMAL LOW (ref 36.0–46.0)
Hemoglobin: 8.1 g/dL — ABNORMAL LOW (ref 12.0–15.0)
LYMPHS ABS: 2.5 10*3/uL (ref 0.7–4.0)
LYMPHS PCT: 13 %
MCH: 31.4 pg (ref 26.0–34.0)
MCHC: 34.2 g/dL (ref 30.0–36.0)
MCV: 91.9 fL (ref 78.0–100.0)
MONOS PCT: 11 %
Monocytes Absolute: 2.1 10*3/uL — ABNORMAL HIGH (ref 0.1–1.0)
NEUTROS ABS: 14.9 10*3/uL — AB (ref 1.7–7.7)
Neutrophils Relative %: 76 %
PLATELETS: 126 10*3/uL — AB (ref 150–400)
RBC: 2.58 MIL/uL — ABNORMAL LOW (ref 3.87–5.11)
RDW: 13.4 % (ref 11.5–15.5)
WBC: 19.5 10*3/uL — ABNORMAL HIGH (ref 4.0–10.5)

## 2017-09-09 LAB — BASIC METABOLIC PANEL
ANION GAP: 7 (ref 5–15)
Anion gap: 8 (ref 5–15)
BUN: 6 mg/dL (ref 6–20)
BUN: 5 mg/dL — ABNORMAL LOW (ref 6–20)
CHLORIDE: 106 mmol/L (ref 101–111)
CO2: 29 mmol/L (ref 22–32)
CO2: 28 mmol/L (ref 22–32)
CREATININE: 0.76 mg/dL (ref 0.44–1.00)
Calcium: 7.6 mg/dL — ABNORMAL LOW (ref 8.9–10.3)
Calcium: 8 mg/dL — ABNORMAL LOW (ref 8.9–10.3)
Chloride: 110 mmol/L (ref 101–111)
Creatinine, Ser: 0.87 mg/dL (ref 0.44–1.00)
GFR calc Af Amer: >60 mL/min (ref >60)
GFR calc non Af Amer: >60 mL/min (ref >60)
GLUCOSE: 134 mg/dL — AB (ref 65–99)
Glucose, Bld: 123 mg/dL — ABNORMAL HIGH (ref 65–99)
POTASSIUM: 3.1 mmol/L — AB (ref 3.5–5.1)
Potassium: 2.5 mmol/L — CL (ref 3.5–5.1)
SODIUM: 143 mmol/L (ref 135–145)
Sodium: 145 mmol/L (ref 135–145)

## 2017-09-09 LAB — CULTURE, BLOOD (ROUTINE X 2)
Culture: NO GROWTH
SPECIAL REQUESTS: ADEQUATE

## 2017-09-09 LAB — MAGNESIUM: Magnesium: 1.7 mg/dL (ref 1.7–2.4)

## 2017-09-09 MED ORDER — HEPARIN SOD (PORK) LOCK FLUSH 100 UNIT/ML IV SOLN
500.0000 [IU] | INTRAVENOUS | Status: AC | PRN
  Administered 2017-09-09: 500 [IU]

## 2017-09-09 MED ORDER — POTASSIUM CHLORIDE 10 MEQ/100ML IV SOLN
10.0000 meq | INTRAVENOUS | Status: AC
  Administered 2017-09-09 (×4): 10 meq via INTRAVENOUS
  Filled 2017-09-09 (×4): qty 100

## 2017-09-09 MED ORDER — POTASSIUM CHLORIDE CRYS ER 20 MEQ PO TBCR
40.0000 meq | EXTENDED_RELEASE_TABLET | ORAL | Status: AC
  Administered 2017-09-09 (×2): 40 meq via ORAL
  Filled 2017-09-09 (×3): qty 2

## 2017-09-09 MED ORDER — PROMETHAZINE HCL 12.5 MG PO TABS: 12.5000 mg | ORAL_TABLET | Freq: Four times a day (QID) | ORAL | 0 refills | Status: DC | PRN

## 2017-09-09 MED ORDER — POTASSIUM CHLORIDE 20 MEQ/15ML (10%) PO SOLN
40.0000 meq | ORAL | Status: DC
  Administered 2017-09-09: 40 meq via ORAL
  Filled 2017-09-09 (×2): qty 30

## 2017-09-09 MED ORDER — LOPERAMIDE HCL 2 MG PO CAPS: 2.0000 mg | ORAL_CAPSULE | Freq: Three times a day (TID) | ORAL | 0 refills | Status: DC | PRN

## 2017-09-09 MED ORDER — MAGNESIUM SULFATE 2 GM/50ML IV SOLN
2.0000 g | Freq: Once | INTRAVENOUS | Status: AC
  Administered 2017-09-09: 2 g via INTRAVENOUS
  Filled 2017-09-09 (×2): qty 50

## 2017-09-09 MED ORDER — POTASSIUM CHLORIDE ER 10 MEQ PO TBCR: 20.0000 meq | EXTENDED_RELEASE_TABLET | Freq: Three times a day (TID) | ORAL | 0 refills | Status: DC

## 2017-09-09 NOTE — Progress Notes (Signed)
Received report from previous RN, and I agree with assessment and all charting regarding this patient. Will continue to monitor.  

## 2017-09-09 NOTE — Discharge Summary (Signed)
[facility]  Physician Discharge Summary   Patient ID: Sheila [name] MRN: [id number] DOB/AGE: [birth year] 41 y.o.  Admit date: 09/04/2017 Discharge date: 09/09/2017  PCP: [name], MD  DISCHARGE DIAGNOSES:  Principal Problem:   Sepsis [facility]) Active Problems:   Malignant neoplasm of upper-outer quadrant of right breast in female, estrogen receptor positive ([place])   Port-A-Cath in place   Neutropenic fever ([place])   Hyponatremia   Malignant neoplasm of breast in female, estrogen receptor positive ([place])   RECOMMENDATIONS FOR OUTPATIENT FOLLOW UP: 1. Recommend repeating basic metabolic panel next week when she follows up at the cancer center  DISCHARGE CONDITION: fair  Diet recommendation: Regular as tolerated  Filed Weights   09/04/17 1652 09/04/17 2021  Weight: 71.7 kg (158 lb) 72 kg (158 lb 11.7 oz)    INITIAL HISTORY: 41 year old Caucasian female with a past medical history of breast cancer status post bilateral mastectomy, who started chemotherapy with cycle on October 12, presented with fever, weakness, dizziness.She was found to be febrile. She was found to be neutropenic. She was hospitalized for further management.  Consultations:  Medical oncology   HOSPITAL COURSE:    Sepsis/neutropenic fever  Etiology/source of infection remains unclear. The erythematous area around her Port-A-Cath is stable and thought to be due to reaction to tape.  Influenza panel was negative.  Stool studies including C. difficile and GI pathogen panel unremarkable.  Lactic acid level was elevated and improved.  Blood cultures negative so far.  CT scan of the abdomen pelvis without any significant abnormalities except for an ovarian cyst on the right.  Pro-calcitonin level was less than 0.1.  Patient was initially started on broad-spectrum antibiotics which were discontinued on 10/21.  Patient remains afebrile.  WBC now elevated and likely due to filgrastim.    Diarrhea  persists but improving per patient. It is solidifying, not as frequently and with smaller amount. Symptomatic treatment for nausea.  Patient has been ambulating without any difficulties.  Very keen on going home.  History of breast cancer status post bilateral mastectomy. Followed by oncology. She started her chemotherapy on October 12. She's had 1 cycle so far with Taxotere, carboplatin, Herceptin, and perjeta. She also received dexamethasone along with this chemotherapy.  She was placed on stress dose steroids.    These were tapered off.  Patient has borderline low blood pressures at baseline.  Outpatient follow-up with oncology.  Hypokalemia Patient has persistently low potassium levels.  Most likely due to GI loss.  She has had a lot of difficulty tolerating potassium tablets as well as potassium solution.  She will be prescribed the 10 meq dose which is a smaller tablet and which she can swallow more easily.  Potassium was aggressively repleted today.  Magnesium was 1.7 and was repleted as well.  Potassium level was rechecked and is 3.1.  Patient would really like to go home.  She will be given additional dose prior to discharge.  And she will be given a prescription for oral potassium.  Would recommend levels be rechecked at the cancer center next week.  She was also asked to eat a a couple of bananas every day.    Hyperglycemia Secondary to steroids.  Should improve as steroid is tapered down.  No history of diabetes.  Normocytic anemia. No evidence for overt bleeding.  Mild drop in hemoglobin is noted.  She was asymptomatic.  Overall stable.  Patient very keen on going home today.  She has significantly improved.  Potassium  supplementation can be continued at home.  Okay for discharge.    PERTINENT LABS:  The results of significant diagnostics from this hospitalization (including imaging, microbiology, ancillary and laboratory) are listed below for reference.     Microbiology: Recent Results (from the past 240 hour(s))  Culture, Blood     Status: None (Preliminary result)   Collection Time: 09/04/17  3:06 PM  Result Value Ref Range Status   BLOOD CULTURE, ROUTINE Preliminary report  Preliminary   Organism ID, Bacteria Comment  Preliminary    Comment: No growth in 36 - 48 hours.  Culture, Blood     Status: None (Preliminary result)   Collection Time: 09/04/17  3:06 PM  Result Value Ref Range Status   BLOOD CULTURE, ROUTINE Preliminary report  Preliminary   Organism ID, Bacteria Comment  Preliminary    Comment: No growth in 36 - 48 hours.  Blood Culture (routine x 2)     Status: None   Collection Time: 09/04/17  5:25 PM  Result Value Ref Range Status   Specimen Description BLOOD LEFT ANTECUBITAL  Final   Special Requests   Final    BOTTLES DRAWN AEROBIC AND ANAEROBIC Blood Culture adequate volume   Culture   Final    NO GROWTH 5 DAYS Performed at [facility], [address], [place], [place] [address]    Report Status 09/09/2017 FINAL  Final  Blood Culture (routine x 2)     Status: None (Preliminary result)   Collection Time: 09/04/17 11:30 PM  Result Value Ref Range Status   Specimen Description BLOOD RIGHT HAND  Final   Special Requests   Final    BOTTLES DRAWN AEROBIC AND ANAEROBIC Blood Culture adequate volume   Culture   Final    NO GROWTH 4 DAYS Performed at [facility], [place] [address] [place], [place] [address]    Report Status PENDING  Incomplete  Gastrointestinal Panel by PCR , Stool     Status: None   Collection Time: 09/05/17  3:55 AM  Result Value Ref Range Status   Campylobacter species NOT DETECTED NOT DETECTED Final   Plesimonas shigelloides NOT DETECTED NOT DETECTED Final   Salmonella species NOT DETECTED NOT DETECTED Final   Yersinia enterocolitica NOT DETECTED NOT DETECTED Final   Vibrio species NOT DETECTED NOT DETECTED Final   Vibrio cholerae NOT DETECTED NOT DETECTED Final   Enteroaggregative  E coli (EAEC) NOT DETECTED NOT DETECTED Final   Enteropathogenic E coli (EPEC) NOT DETECTED NOT DETECTED Final   Enterotoxigenic E coli (ETEC) NOT DETECTED NOT DETECTED Final   Shiga like toxin producing E coli (STEC) NOT DETECTED NOT DETECTED Final   Shigella/Enteroinvasive E coli (EIEC) NOT DETECTED NOT DETECTED Final   Cryptosporidium NOT DETECTED NOT DETECTED Final   Cyclospora cayetanensis NOT DETECTED NOT DETECTED Final   Entamoeba histolytica NOT DETECTED NOT DETECTED Final   Giardia lamblia NOT DETECTED NOT DETECTED Final   Adenovirus F40/41 NOT DETECTED NOT DETECTED Final   Astrovirus NOT DETECTED NOT DETECTED Final   Norovirus GI/GII NOT DETECTED NOT DETECTED Final   Rotavirus A NOT DETECTED NOT DETECTED Final   Sapovirus (I, II, IV, and V) NOT DETECTED NOT DETECTED Final  C difficile quick scan w PCR reflex     Status: None   Collection Time: 09/05/17  3:55 AM  Result Value Ref Range Status   C Diff antigen NEGATIVE NEGATIVE Final   C Diff toxin NEGATIVE NEGATIVE Final  C Diff interpretation No C. difficile detected.  Final     Labs: Basic Metabolic Panel:  Recent Labs Lab 09/06/17 0406 09/07/17 0708 09/08/17 0312 09/09/17 0436 09/09/17 1437  NA 139 140 145 143 145  K 3.2* 3.2* 3.0* 2.5* 3.1*  CL 107 105 111 106 110  CO2 27 27 30 29 28   GLUCOSE 170* 124* 164* 134* 123*  BUN 5* <5* 8 6 5*  CREATININE 0.75 0.68  0.72 0.97 0.76 0.87  CALCIUM 7.7* 7.9* 7.9* 7.6* 8.0*  MG  --  2.0  --  1.7  --    Liver Function Tests:  Recent Labs Lab 09/04/17 1326 09/04/17 1658 09/05/17 0437  AST 12 19 24   ALT 13 16 21   ALKPHOS 57 52 43  BILITOT 1.00 1.2 0.9  PROT 6.8 6.5 5.7*  ALBUMIN 3.8 3.5 2.9*   CBC:  Recent Labs Lab 09/05/17 0437 09/06/17 0406 09/07/17 0708 09/08/17 0312 09/09/17 0436  WBC 0.3* 1.4* 9.0 25.1* 19.5*  NEUTROABS 0.0* 0.3* 5.8 19.3* 14.9*  HGB 10.0* 9.0* 9.0* 8.4* 8.1*  HCT 29.4* 25.7* 26.8* 24.7* 23.7*  MCV 90.5 89.2 89.3 90.8 91.9   PLT 148* 133* 136* 142* 126*     IMAGING STUDIES Ct Abdomen Pelvis W Contrast  Result Date: 09/05/2017 CLINICAL DATA:  Acute generalized abdominal pain. History of breast cancer. EXAM: CT ABDOMEN AND PELVIS WITH CONTRAST TECHNIQUE: Multidetector CT imaging of the abdomen and pelvis was performed using the standard protocol following bolus administration of intravenous contrast. CONTRAST:  171mL ISOVUE-300 IOPAMIDOL (ISOVUE-300) INJECTION 61% COMPARISON:  MRI of August 25, 2017.  CT scan of July 02, 2017. FINDINGS: Lower chest: No acute abnormality. Hepatobiliary: No gallstones are noted. Stable 7 mm hyperdensity is noted posteriorly in right hepatic lobe most consistent with hemangioma based on recent MRI. No other abnormality seen in the hepatic parenchyma. Pancreas: Unremarkable. No pancreatic ductal dilatation or surrounding inflammatory changes. Spleen: Normal in size without focal abnormality. Adrenals/Urinary Tract: Adrenal glands are unremarkable. Kidneys are normal, without renal calculi, focal lesion, or hydronephrosis. Bladder is unremarkable. Stomach/Bowel: Stomach is within normal limits. Appendix appears normal. No evidence of bowel wall thickening, distention, or inflammatory changes. Vascular/Lymphatic: No significant vascular findings are present. No enlarged abdominal or pelvic lymph nodes. Reproductive: Uterus and left ovary unremarkable. 2.4 cm involuting cyst is noted in right ovary. Other: No abdominal wall hernia or abnormality. No abdominopelvic ascites. Musculoskeletal: No acute or significant osseous findings. IMPRESSION: Stable 7 mm hyperdensity seen in right hepatic lobe most consistent with hemangioma based on recent MRI. 2.4 cm right ovarian involuting cyst is noted. No other abnormality seen in the abdomen or pelvis. Electronically Signed   By: Marijo Conception, M.D.   On: 09/05/2017 16:20   Dg Chest Port 1 View  Result Date: 09/04/2017 CLINICAL DATA:  Breast cancer,  first chemotherapy treatment 6 days ago. Fever, hypotensive and tachycardic. EXAM: PORTABLE CHEST 1 VIEW COMPARISON:  Chest radiograph July 22, 2017 FINDINGS: Cardiomediastinal silhouette is normal. No pleural effusions or focal consolidations. Trachea projects midline and there is no pneumothorax. Soft tissue planes and included osseous structures are non-suspicious. Single lumen RIGHT chest Port-A-Cath distal tip projects in mid superior vena cava. Bilateral mastectomies. Surgical clips RIGHT axilla. IMPRESSION: No acute cardiopulmonary process. Electronically Signed   By: Elon Alas M.D.   On: 09/04/2017 17:19    DISCHARGE EXAMINATION: Vitals:   09/07/17 2217 09/08/17 0508 09/08/17 1500 09/09/17 0514  BP: (!) 97/52 (!) 105/58 110/64 Marland Kitchen)  105/54  Pulse: 90 91 88 73  Resp: 18 16 18 18   Temp: 98.2 F (36.8 C) 97.7 F (36.5 C) 98.4 F (36.9 C) 98.5 F (36.9 C)  TempSrc: Oral Oral Oral Oral  SpO2: 99% 100% 100% 100%  Weight:      Height:       General appearance: alert, cooperative, appears stated age and no distress Resp: clear to auscultation bilaterally Cardio: regular rate and rhythm, S1, S2 normal, no murmur, click, rub or gallop GI: soft, non-tender; bowel sounds normal; no masses,  no organomegaly  DISPOSITION: Home  Discharge Instructions    Call MD for:  extreme fatigue    Complete by:  As directed    Call MD for:  persistant dizziness or light-headedness    Complete by:  As directed    Call MD for:  persistant nausea and vomiting    Complete by:  As directed    Call MD for:  severe uncontrolled pain    Complete by:  As directed    Call MD for:  temperature >100.4    Complete by:  As directed    Diet general    Complete by:  As directed    Discharge instructions    Complete by:  As directed    Please take your potassium as prescribed.  Have a couple of bananas every day.  Follow-up with the cancer center next week.  You will need to have blood work to check  your potassium levels at that time.  You were cared for by a hospitalist during your hospital stay. If you have any questions about your discharge medications or the care you received while you were in the hospital after you are discharged, you can call the unit and asked to speak with the hospitalist on call if the hospitalist that took care of you is not available. Once you are discharged, your primary care physician will handle any further medical issues. Please note that NO REFILLS for any discharge medications will be authorized once you are discharged, as it is imperative that you return to your primary care physician (or establish a relationship with a primary care physician if you do not have one) for your aftercare needs so that they can reassess your need for medications and monitor your lab values. If you do not have a primary care physician, you can call [phone] for a physician referral.   Increase activity slowly    Complete by:  As directed       ALLERGIES: No Known Allergies   Current Discharge Medication List    START taking these medications   Details  loperamide (IMODIUM) 2 MG capsule Take 1 capsule (2 mg total) by mouth 3 (three) times daily as needed for diarrhea or loose stools. Qty: 30 capsule, Refills: 0    potassium chloride (K-DUR) 10 MEQ tablet Take 2 tablets (20 mEq total) by mouth 3 (three) times daily. Qty: 30 tablet, Refills: 0    promethazine (PHENERGAN) 12.5 MG tablet Take 1 tablet (12.5 mg total) by mouth every 6 (six) hours as needed for nausea or vomiting. Qty: 30 tablet, Refills: 0      CONTINUE these medications which have NOT CHANGED   Details  calcium carbonate (TUMS - DOSED IN MG ELEMENTAL CALCIUM) 500 MG chewable tablet Chew 2 tablets by mouth as needed for indigestion or heartburn.     Cholecalciferol (VITAMIN D) 2000 units tablet Take 2,000 Units by mouth daily.    dexamethasone (  DECADRON) 4 MG tablet Take 2 tablets (8 mg total) by mouth 2  (two) times daily. Start the day before Taxotere. Then again the day after chemo for 3 days. Qty: 30 tablet, Refills: 1   Associated Diagnoses: Malignant neoplasm of upper-outer quadrant of right breast in female, estrogen receptor positive (HCC)    diphenhydramine-acetaminophen (TYLENOL PM) 25-500 MG TABS tablet Take 2 tablets by mouth at bedtime. sleep    fluticasone (FLONASE) 50 MCG/ACT nasal spray Place 1 spray into both nostrils daily.    lidocaine-prilocaine (EMLA) cream Apply to affected area once Qty: 30 g, Refills: 3   Associated Diagnoses: Malignant neoplasm of upper-outer quadrant of right breast in female, estrogen receptor positive (HCC)    naproxen sodium (ANAPROX) 220 MG tablet Take 220 mg by mouth 2 (two) times daily with a meal.    omeprazole (PRILOSEC) 20 MG capsule Take 20 mg by mouth at bedtime.     ondansetron (ZOFRAN) 8 MG tablet Take 1 tablet (8 mg total) by mouth 2 (two) times daily as needed for refractory nausea / vomiting. Start on day 3 after chemo. Qty: 30 tablet, Refills: 1   Associated Diagnoses: Malignant neoplasm of upper-outer quadrant of right breast in female, estrogen receptor positive (HCC)    prochlorperazine (COMPAZINE) 10 MG tablet Take 1 tablet (10 mg total) by mouth every 6 (six) hours as needed (Nausea or vomiting). Qty: 30 tablet, Refills: 1   Associated Diagnoses: Malignant neoplasm of upper-outer quadrant of right breast in female, estrogen receptor positive ([place])         Follow-up Information    [name], MD. Schedule an appointment as soon as possible for a visit in 1 week(s).   Specialties:  Hematology, Oncology Contact information: [place] [address] [phone]           TOTAL DISCHARGE TIME: 35 mins  [facility] Pager [phone]  09/09/2017, 5:34 PM

## 2017-09-09 NOTE — Discharge Instructions (Signed)

## 2017-09-09 NOTE — Progress Notes (Signed)
CRITICAL VALUE ALERT  Critical Value:  K 2.5  Date & Time Notied:  10/23 0602  Provider Notified: Kennon Holter   Orders Received/Actions taken: Awaiting orders

## 2017-09-10 ENCOUNTER — Telehealth: Payer: Self-pay

## 2017-09-10 ENCOUNTER — Ambulatory Visit: Payer: PRIVATE HEALTH INSURANCE | Admitting: Physical Therapy

## 2017-09-10 ENCOUNTER — Encounter: Payer: Self-pay | Admitting: Physical Therapy

## 2017-09-10 DIAGNOSIS — M25612 Stiffness of left shoulder, not elsewhere classified: Secondary | ICD-10-CM

## 2017-09-10 DIAGNOSIS — Z483 Aftercare following surgery for neoplasm: Secondary | ICD-10-CM

## 2017-09-10 DIAGNOSIS — R293 Abnormal posture: Secondary | ICD-10-CM

## 2017-09-10 DIAGNOSIS — M6281 Muscle weakness (generalized): Secondary | ICD-10-CM

## 2017-09-10 DIAGNOSIS — R600 Localized edema: Secondary | ICD-10-CM

## 2017-09-10 DIAGNOSIS — M25611 Stiffness of right shoulder, not elsewhere classified: Secondary | ICD-10-CM

## 2017-09-10 LAB — CULTURE, BLOOD (ROUTINE X 2)
CULTURE: NO GROWTH
SPECIAL REQUESTS: ADEQUATE

## 2017-09-10 LAB — CULTURE, BLOOD (SINGLE)

## 2017-09-10 NOTE — Therapy (Signed)
[facility] [place], [place], [address] Phone: [phone]   Fax:  [phone]  Physical Therapy Treatment  Patient Details  Name: Sheila [name] MRN: [id number] Date of Birth: [birth year] Referring Provider: Dr. [name]  Encounter Date: 09/10/2017      PT End of Session - 09/10/17 1629    Visit Number 7   Number of Visits 17   Date for PT Re-Evaluation 10/08/17   PT Start Time 8527   PT Stop Time 1603   PT Time Calculation (min) 25 min   Activity Tolerance Patient tolerated treatment well   Behavior During Therapy [facility] for tasks assessed/performed      Past Medical History:  Diagnosis Date  . Anxiety   . Cancer [facility])    right breast   . Family history of breast cancer   . Family history of colon cancer   . GERD (gastroesophageal reflux disease)   . Headache    couple of migraines in the past  . History of kidney stones   . Vitamin D deficiency     Past Surgical History:  Procedure Laterality Date  . egg donation    . kidney stone removal    . MASTECTOMY W/ SENTINEL NODE BIOPSY Bilateral 07/22/2017   Procedure: RIGHT TOTAL MASTECTOMY WITH RIGHT AXILLARY SENTINEL LYMPH NODE BIOPSY, LEFT PROPHYLACTIC MASTECTOMY;  Surgeon: [name], MD;  Location: [place];  Service: General;  Laterality: Bilateral;  . PORTACATH PLACEMENT Right 07/22/2017   Procedure: INSERTION PORT-A-CATH WITH [name];  Surgeon: [name], MD;  Location: [place];  Service: General;  Laterality: Right;    There were no vitals filed for this visit.      Subjective Assessment - 09/10/17 1538    Subjective I just got out of the hospital yesterday after being in there 5 days. I had sepsis. Can we go easy today?   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 It measures 5.5 cm and is located in the upper outer quadrant with a Ki67 of  12%. , Bilateral mastectomies on 07/22/17 with SLNB on R with 2 to 3 additional nodes taken, pt has to complete chemo and radiation and will begin chemo on Aug 29, 2017   Patient Stated Goals to return to prior level of function, be able to sleep with no discomfort, get ROM back to where it was   Currently in Pain? No/denies   Pain Score 0-No pain            [facility] PT Assessment - 09/10/17 0001      AROM   Right Shoulder Flexion 132 Degrees   Right Shoulder ABduction 112 Degrees   Left Shoulder Flexion 148 Degrees   Left Shoulder ABduction 144 Degrees                     [facility] Adult PT Treatment/Exercise - 09/10/17 0001      Manual Therapy   Passive ROM within pain limits for right and left shoulders in flexion, abduction, ER, and IR                    Long Term Clinic Goals - 09/10/17 1539      CC Long Term Goal  #1   Title Pt to demonstrate 165 degrees of bilateral shoulder flexion to allow her to reach up and overhead   Baseline R 98, L 125,  08/25/17- R 122, L 141, 09/10/17- 132 on R, 148 on L    Time 4   Period Weeks   Status On-going   Target Date 10/08/17     CC Long Term Goal  #2   Title Pt to demonstrate 165 degrees of bilateral shoulder abduction to allow pt to reach out to sides   Baseline R 74, L 86, 08/25/17- R 92, L 141, 09/10/17- 112 on R, 144 on L   Time 4   Period Weeks   Status On-going   Target Date 10/08/17     CC Long Term Goal  #3   Title Pt to demonstrate 70 degrees of right shoulder internal rotation to allow her to return to prior level of function   Baseline 48, 08/25/17- 51, 09/10/17- 71 degrees   Time 4   Period Weeks   Status Achieved     CC Long Term Goal  #4   Title Pt to report a 75% improvement in right trunk edema to allow improved comfort and sleep   Baseline 08/25/17- 60%, 09/10/17- 70%   Time 4   Period Weeks   Status On-going   Target Date 10/08/17     CC Long Term Goal  #5   Title Pt to be independent  in a home exercise program for continued strengthening and stretching   Time 4   Period Weeks   Status On-going            Plan - 09/10/17 1630    Clinical Impression Statement Pt was recently hospitilized for 5 days for sepsis. She wanted to take it easier today because she is still recovering after getting discharged from the hospital yesterday. Assessed pt's progress towards goals in therapy. She is progressing towards all her goals. She would benefit from additional skilled PT visits to continue to improve bilateral shoulder ROM and strength, improve edema and progress pt towards independence with a home exercise program.    Rehab Potential Good   Clinical Impairments Affecting Rehab Potential pt to begin chemo Oct 12 and will have it every 3 weeks    PT Frequency 2x / week   PT Duration 4 weeks   PT Treatment/Interventions ADLs/Self Care Home Management;Therapeutic activities;Therapeutic exercise;Orthotic Fit/Training;Patient/family education;Manual techniques;Manual lymph drainage;Scar mobilization;Passive range of motion;Taping   PT Next Visit Plan add supine scap, continue pulleys and ball, do PROM to bilat shoulders,   add active exercsie as able.  later teach Strength ABC program.    PT Home Exercise Plan Post op shoulder ROM HEP advised pt to not lift arms above 90 degrees until a week after drains removed, supine dowel   Consulted and Agree with Plan of Care Patient      Patient will benefit from skilled therapeutic intervention in order to improve the following deficits and impairments:  Postural dysfunction, Decreased knowledge of precautions, Pain, Impaired UE functional use, Decreased range of motion, Decreased strength, Decreased scar mobility, Increased fascial restricitons  Visit Diagnosis: Stiffness of left shoulder, not elsewhere classified - Plan: PT plan of care cert/re-cert  Stiffness of right shoulder, not elsewhere classified - Plan: PT plan of care  cert/re-cert  Abnormal posture - Plan: PT plan of care cert/re-cert  Aftercare following surgery for neoplasm - Plan: PT plan of care cert/re-cert  Localized edema - Plan: PT plan of care cert/re-cert  Muscle weakness (generalized) - Plan: PT plan of care cert/re-cert     Problem List Patient Active Problem List   Diagnosis Date  Noted  . Malignant neoplasm of breast in female, estrogen receptor positive ([place])   . Sepsis ([place]) 09/04/2017  . Neutropenic fever ([place]) 09/04/2017  . Hyponatremia 09/04/2017  . Port-A-Cath in place 08/28/2017  . Genetic testing 06/25/2017  . Family history of breast cancer   . Family history of colon cancer   . Malignant neoplasm of upper-outer quadrant of right breast in female, estrogen receptor positive ([place]) 06/16/2017    Allyson Sabal [facility] 09/10/2017, 4:36 PM  [place], [place], [address] Phone: [phone]   Fax:  [phone]  Name: Vitoria Conyer MRN: [id number] Date of Birth: [birth year]  Manus Gunning, PT 09/10/17 4:36 PM

## 2017-09-10 NOTE — Telephone Encounter (Signed)
Patient called and left message. She was discharged from the hospital yesterday. Has appt with Dr. Burr Medico 11/2 and just FYI. Call her if she needs to do anything.

## 2017-09-12 ENCOUNTER — Encounter: Payer: Self-pay | Admitting: Physical Therapy

## 2017-09-12 ENCOUNTER — Ambulatory Visit: Payer: PRIVATE HEALTH INSURANCE | Admitting: Physical Therapy

## 2017-09-12 DIAGNOSIS — M6281 Muscle weakness (generalized): Secondary | ICD-10-CM

## 2017-09-12 DIAGNOSIS — M25612 Stiffness of left shoulder, not elsewhere classified: Secondary | ICD-10-CM

## 2017-09-12 DIAGNOSIS — R293 Abnormal posture: Secondary | ICD-10-CM

## 2017-09-12 DIAGNOSIS — M25611 Stiffness of right shoulder, not elsewhere classified: Secondary | ICD-10-CM

## 2017-09-12 NOTE — Patient Instructions (Signed)

## 2017-09-12 NOTE — Therapy (Signed)
[place], [place], [address] Phone: [phone]   Fax:  [phone]  Physical Therapy Treatment  Patient Details  Name: Sheila [name] MRN: [id number] Date of Birth: [birth year] Referring Provider: Dr. [name]  Encounter Date: 09/12/2017      PT End of Session - 09/12/17 0934    Visit Number 8   Number of Visits 17   Date for PT Re-Evaluation 10/08/17   PT Start Time 0852   PT Stop Time 0933   PT Time Calculation (min) 41 min   Activity Tolerance Patient tolerated treatment well   Behavior During Therapy [facility] for tasks assessed/performed      Past Medical History:  Diagnosis Date  . Anxiety   . Cancer [facility])    right breast   . Family history of breast cancer   . Family history of colon cancer   . GERD (gastroesophageal reflux disease)   . Headache    couple of migraines in the past  . History of kidney stones   . Vitamin D deficiency     Past Surgical History:  Procedure Laterality Date  . egg donation    . kidney stone removal    . MASTECTOMY W/ SENTINEL NODE BIOPSY Bilateral 07/22/2017   Procedure: RIGHT TOTAL MASTECTOMY WITH RIGHT AXILLARY SENTINEL LYMPH NODE BIOPSY, LEFT PROPHYLACTIC MASTECTOMY;  Surgeon: [name], MD;  Location: [facility];  Service: General;  Laterality: Bilateral;  . PORTACATH PLACEMENT Right 07/22/2017   Procedure: INSERTION PORT-A-CATH WITH [name];  Surgeon: [name], MD;  Location: [place];  Service: General;  Laterality: Right;    There were no vitals filed for this visit.      Subjective Assessment - 09/12/17 0854    Subjective I am feeling a lot better today. I do not have another chemo until Friday. I have been getting some rest. I still am feeling tight.    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 It measures 5.5 cm and is located in the upper outer  quadrant with a Ki67 of 12%. , Bilateral mastectomies on 07/22/17 with SLNB on R with 2 to 3 additional nodes taken, pt has to complete chemo and radiation and will begin chemo on Aug 29, 2017   Patient Stated Goals to return to prior level of function, be able to sleep with no discomfort, get ROM back to where it was   Currently in Pain? No/denies   Pain Score 0-No pain                         [facility] Adult PT Treatment/Exercise - 09/12/17 0001      Shoulder Exercises: Supine   Horizontal ABduction Strengthening;Both;10 reps;Theraband   Theraband Level (Shoulder Horizontal ABduction) Level 1 (Yellow)   External Rotation Strengthening;Both;10 reps;Theraband   Theraband Level (Shoulder External Rotation) Level 1 (Yellow)   Flexion Strengthening;Both;10 reps;Theraband  narrow and wide grip   Theraband Level (Shoulder Flexion) Level 1 (Yellow)   Other Supine Exercises D2 x 10 reps bilaterally with yellow theraband     Shoulder Exercises: Standing   Other Standing Exercises robbers pose on wall x 10 reps with 5 sec holds     Shoulder Exercises: Pulleys   Flexion 2 minutes  with stretch at end range   ABduction 2 minutes  with stretch at end range     Shoulder Exercises: Therapy  Ball   Flexion 10 reps  with stretch at end   ABduction 10 reps  on left and right     Manual Therapy   Passive ROM within pain limits for right shoulder in flexion, abduction, ER, and IR                      Breast Clinic Goals - 06/18/17 1221      Patient will be able to verbalize understanding of pertinent lymphedema risk reduction practices relevant to her diagnosis specifically related to skin care.   Time 1   Period Days   Status Achieved     Patient will be able to return demonstrate and/or verbalize understanding of the post-op home exercise program related to regaining shoulder range of motion.   Time 1   Period Days   Status Achieved     Patient will be able to  verbalize understanding of the importance of attending the postoperative After Breast Cancer Class for further lymphedema risk reduction education and therapeutic exercise.   Time 1   Period Days   Status Achieved          Long Term Clinic Goals - 09/10/17 1539      CC Long Term Goal  #1   Title Pt to demonstrate 165 degrees of bilateral shoulder flexion to allow her to reach up and overhead   Baseline R 98, L 125, 08/25/17- R 122, L 141, 09/10/17- 132 on R, 148 on L    Time 4   Period Weeks   Status On-going   Target Date 10/08/17     CC Long Term Goal  #2   Title Pt to demonstrate 165 degrees of bilateral shoulder abduction to allow pt to reach out to sides   Baseline R 74, L 86, 08/25/17- R 92, L 141, 09/10/17- 112 on R, 144 on L   Time 4   Period Weeks   Status On-going   Target Date 10/08/17     CC Long Term Goal  #3   Title Pt to demonstrate 70 degrees of right shoulder internal rotation to allow her to return to prior level of function   Baseline 48, 08/25/17- 51, 09/10/17- 71 degrees   Time 4   Period Weeks   Status Achieved     CC Long Term Goal  #4   Title Pt to report a 75% improvement in right trunk edema to allow improved comfort and sleep   Baseline 08/25/17- 60%, 09/10/17- 70%   Time 4   Period Weeks   Status On-going   Target Date 10/08/17     CC Long Term Goal  #5   Title Pt to be independent in a home exercise program for continued strengthening and stretching   Time 4   Period Weeks   Status On-going            Plan - 09/12/17 0934    Clinical Impression Statement Pt is feeling better today. Continued with AAROM exercises for bilateral shoulders and added supine scapular exercises today for shoulder strengthening. Issued these to pt as part of home exercise program. She reports she still has a lot of tightness across her incisions.    Rehab Potential Good   Clinical Impairments Affecting Rehab Potential pt to begin chemo Oct 12 and will have it  every 3 weeks    PT Frequency 2x / week   PT Duration 4 weeks   PT Treatment/Interventions ADLs/Self Care Home  Management;Therapeutic activities;Therapeutic exercise;Orthotic Fit/Training;Patient/family education;Manual techniques;Manual lymph drainage;Scar mobilization;Passive range of motion;Taping   PT Next Visit Plan assess indep with supine scap, add rockwood?, continue pulleys and ball, do PROM to bilat shoulders,   add active exercsie as able.  later teach Strength ABC program.    PT Home Exercise Plan Post op shoulder ROM HEP advised pt to not lift arms above 90 degrees until a week after drains removed, supine dowel   Consulted and Agree with Plan of Care Patient      Patient will benefit from skilled therapeutic intervention in order to improve the following deficits and impairments:  Postural dysfunction, Decreased knowledge of precautions, Pain, Impaired UE functional use, Decreased range of motion, Decreased strength, Decreased scar mobility, Increased fascial restricitons  Visit Diagnosis: Stiffness of left shoulder, not elsewhere classified  Stiffness of right shoulder, not elsewhere classified  Abnormal posture  Muscle weakness (generalized)     Problem List Patient Active Problem List   Diagnosis Date Noted  . Malignant neoplasm of breast in female, estrogen receptor positive ([place])   . Sepsis ([place]) 09/04/2017  . Neutropenic fever ([place]) 09/04/2017  . Hyponatremia 09/04/2017  . Port-A-Cath in place 08/28/2017  . Genetic testing 06/25/2017  . Family history of breast cancer   . Family history of colon cancer   . Malignant neoplasm of upper-outer quadrant of right breast in female, estrogen receptor positive ([place]) 06/16/2017    Allyson Sabal [facility] 09/12/2017, 9:36 AM  [place], [place], [address] Phone: [phone]   Fax:  [phone]  Name: Taneasha Fuqua MRN:  [id number] Date of Birth: Jan 24, 1976  Manus Gunning, PT 09/12/17 9:36 AM

## 2017-09-12 NOTE — Telephone Encounter (Signed)
I called her back to follow up. She is doing much better, diarrhea resolved, no BM for the past two days, eating well, taking KCL supplements until this Sunday. I suggest her to come in for lab on Monday 10/20 to make sure hypokalemia resolves, she agrees, she will also drop off her FMLA papers.   [name] MD

## 2017-09-15 ENCOUNTER — Ambulatory Visit: Payer: PRIVATE HEALTH INSURANCE | Admitting: Physical Therapy

## 2017-09-15 ENCOUNTER — Telehealth: Payer: Self-pay | Admitting: Hematology

## 2017-09-15 ENCOUNTER — Ambulatory Visit (HOSPITAL_BASED_OUTPATIENT_CLINIC_OR_DEPARTMENT_OTHER): Payer: PRIVATE HEALTH INSURANCE

## 2017-09-15 DIAGNOSIS — Z170 Estrogen receptor positive status [ER+]: Secondary | ICD-10-CM

## 2017-09-15 DIAGNOSIS — C50411 Malignant neoplasm of upper-outer quadrant of right female breast: Secondary | ICD-10-CM

## 2017-09-15 DIAGNOSIS — R293 Abnormal posture: Secondary | ICD-10-CM

## 2017-09-15 DIAGNOSIS — M25612 Stiffness of left shoulder, not elsewhere classified: Secondary | ICD-10-CM

## 2017-09-15 DIAGNOSIS — M25611 Stiffness of right shoulder, not elsewhere classified: Secondary | ICD-10-CM

## 2017-09-15 DIAGNOSIS — Z95828 Presence of other vascular implants and grafts: Secondary | ICD-10-CM

## 2017-09-15 LAB — CBC WITH DIFFERENTIAL/PLATELET
BASO%: 0.9 % (ref 0.0–2.0)
Basophils Absolute: 0 10*3/uL (ref 0.0–0.1)
EOS%: 0 % (ref 0.0–7.0)
Eosinophils Absolute: 0 10*3/uL (ref 0.0–0.5)
HEMATOCRIT: 30.5 % — AB (ref 34.8–46.6)
HGB: 10.1 g/dL — ABNORMAL LOW (ref 11.6–15.9)
LYMPH#: 1.2 10*3/uL (ref 0.9–3.3)
LYMPH%: 31 % (ref 14.0–49.7)
MCH: 30.4 pg (ref 25.1–34.0)
MCHC: 33.1 g/dL (ref 31.5–36.0)
MCV: 91.6 fL (ref 79.5–101.0)
MONO#: 0.3 10*3/uL (ref 0.1–0.9)
MONO%: 7.7 % (ref 0.0–14.0)
NEUT%: 60.4 % (ref 38.4–76.8)
NEUTROS ABS: 2.3 10*3/uL (ref 1.5–6.5)
PLATELETS: 150 10*3/uL (ref 145–400)
RBC: 3.33 10*6/uL — ABNORMAL LOW (ref 3.70–5.45)
RDW: 13.3 % (ref 11.2–14.5)
WBC: 3.8 10*3/uL — AB (ref 3.9–10.3)

## 2017-09-15 LAB — COMPREHENSIVE METABOLIC PANEL
ALT: 19 U/L (ref 0–55)
ANION GAP: 7 meq/L (ref 3–11)
AST: 15 U/L (ref 5–34)
Albumin: 3.2 g/dL — ABNORMAL LOW (ref 3.5–5.0)
Alkaline Phosphatase: 59 U/L (ref 40–150)
BILIRUBIN TOTAL: 0.26 mg/dL (ref 0.20–1.20)
BUN: 10.3 mg/dL (ref 7.0–26.0)
CALCIUM: 8.7 mg/dL (ref 8.4–10.4)
CHLORIDE: 109 meq/L (ref 98–109)
CO2: 24 meq/L (ref 22–29)
CREATININE: 0.8 mg/dL (ref 0.6–1.1)
EGFR: >60 mL/min/{1.73_m2} (ref >60)
Glucose: 92 mg/dl (ref 70–140)
Potassium: 4.1 mEq/L (ref 3.5–5.1)
Sodium: 139 mEq/L (ref 136–145)
TOTAL PROTEIN: 6.1 g/dL — AB (ref 6.4–8.3)

## 2017-09-15 MED ORDER — SODIUM CHLORIDE 0.9% FLUSH
10.0000 mL | Freq: Once | INTRAVENOUS | Status: AC
  Administered 2017-09-15: 10 mL
  Filled 2017-09-15: qty 10

## 2017-09-15 NOTE — Telephone Encounter (Signed)
Spoke with patient and scheduled appt per 10/26 sch msg.

## 2017-09-15 NOTE — Therapy (Signed)
[place], [place], [address] Phone: [phone]   Fax:  [phone]  Physical Therapy Treatment  Patient Details  Name: Sheila [name] MRN: [id number] Date of Birth: 03/22/1976 Referring Provider: Dr. [name]  Encounter Date: 09/15/2017      PT End of Session - 09/15/17 1615    Visit Number 9   Number of Visits 17   Date for PT Re-Evaluation 10/08/17   PT Start Time 9753   PT Stop Time 1559   PT Time Calculation (min) 46 min   Activity Tolerance Patient tolerated treatment well   Behavior During Therapy [facility] for tasks assessed/performed      Past Medical History:  Diagnosis Date  . Anxiety   . Cancer [facility])    right breast   . Family history of breast cancer   . Family history of colon cancer   . GERD (gastroesophageal reflux disease)   . Headache    couple of migraines in the past  . History of kidney stones   . Vitamin D deficiency     Past Surgical History:  Procedure Laterality Date  . egg donation    . kidney stone removal    . MASTECTOMY W/ SENTINEL NODE BIOPSY Bilateral 07/22/2017   Procedure: RIGHT TOTAL MASTECTOMY WITH RIGHT AXILLARY SENTINEL LYMPH NODE BIOPSY, LEFT PROPHYLACTIC MASTECTOMY;  Surgeon: [name], MD;  Location: [place];  Service: General;  Laterality: Bilateral;  . PORTACATH PLACEMENT Right 07/22/2017   Procedure: INSERTION PORT-A-CATH WITH [name];  Surgeon: [name], MD;  Location: [place];  Service: General;  Laterality: Right;    There were no vitals filed for this visit.      Subjective Assessment - 09/15/17 1513    Subjective Nothing new today.  Feeling good today. The new exercises (supine scapular series) feel good!   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 It measures 5.5 cm and is located in the upper outer quadrant with a Ki67 of 12%. ,  Bilateral mastectomies on 07/22/17 with SLNB on R with 2 to 3 additional nodes taken, pt has to complete chemo and radiation and will begin chemo on Aug 29, 2017   Currently in Pain? No/denies                         [facility] Adult PT Treatment/Exercise - 09/15/17 0001      Shoulder Exercises: Supine   Horizontal ABduction Strengthening;Both;Theraband;5 reps   Theraband Level (Shoulder Horizontal ABduction) Level 1 (Yellow)   External Rotation Strengthening;Both;Theraband;5 reps   Theraband Level (Shoulder External Rotation) Level 1 (Yellow)   Flexion Strengthening;Both;Theraband;5 reps  narrow and wide grip   Theraband Level (Shoulder Flexion) Level 1 (Yellow)   Other Supine Exercises D2 x 5 reps bilaterally with yellow theraband   Other Supine Exercises with shoulders in approx. 90 degrees abduction, lower trunk rotation for chest stretch 30 seconds x 2 each way; supine over towel roll, horizontal abduction stretch x 30 seconds, then actively x 10     Shoulder Exercises: Seated   Other Seated Exercises backward shoulder rolls at end of session     Shoulder Exercises: Pulleys   Flexion 2 minutes  with stretch at end range   ABduction 2 minutes  with stretch at end range     Shoulder Exercises: Therapy Ball   Flexion 10 reps  with stretch at  end   ABduction 10 reps  on left and right     Manual Therapy   Manual Therapy --   Soft tissue mobilization to right and left chest areas around incisions   Myofascial Release right UE myofascial pulling with movement into abduction; same on left; crosshands technique from right lower to left upper chest; from right lower to left lower chest   Passive ROM within pain limits for right shoulder in flexion, abduction, ER, and IR                      Breast Clinic Goals - 06/18/17 1221      Patient will be able to verbalize understanding of pertinent lymphedema risk reduction practices relevant to her diagnosis  specifically related to skin care.   Time 1   Period Days   Status Achieved     Patient will be able to return demonstrate and/or verbalize understanding of the post-op home exercise program related to regaining shoulder range of motion.   Time 1   Period Days   Status Achieved     Patient will be able to verbalize understanding of the importance of attending the postoperative After Breast Cancer Class for further lymphedema risk reduction education and therapeutic exercise.   Time 1   Period Days   Status Achieved          Long Term Clinic Goals - 09/10/17 1539      CC Long Term Goal  #1   Title Pt to demonstrate 165 degrees of bilateral shoulder flexion to allow her to reach up and overhead   Baseline R 98, L 125, 08/25/17- R 122, L 141, 09/10/17- 132 on R, 148 on L    Time 4   Period Weeks   Status On-going   Target Date 10/08/17     CC Long Term Goal  #2   Title Pt to demonstrate 165 degrees of bilateral shoulder abduction to allow pt to reach out to sides   Baseline R 74, L 86, 08/25/17- R 92, L 141, 09/10/17- 112 on R, 144 on L   Time 4   Period Weeks   Status On-going   Target Date 10/08/17     CC Long Term Goal  #3   Title Pt to demonstrate 70 degrees of right shoulder internal rotation to allow her to return to prior level of function   Baseline 48, 08/25/17- 51, 09/10/17- 71 degrees   Time 4   Period Weeks   Status Achieved     CC Long Term Goal  #4   Title Pt to report a 75% improvement in right trunk edema to allow improved comfort and sleep   Baseline 08/25/17- 60%, 09/10/17- 70%   Time 4   Period Weeks   Status On-going   Target Date 10/08/17     CC Long Term Goal  #5   Title Pt to be independent in a home exercise program for continued strengthening and stretching   Time 4   Period Weeks   Status On-going            Plan - 09/15/17 1531    Clinical Impression Statement Pt. did well with exercise and PROM today.  She reported that the supine  scapular exercises felt good at home and she did them with minimal correction today.  She may be ready to advance to red level Theraband for these. Still has pain with PROM.   Rehab Potential Good  Clinical Impairments Affecting Rehab Potential pt to begin chemo Oct 12 and will have it every 3 weeks    PT Frequency 2x / week   PT Duration 4 weeks   PT Treatment/Interventions ADLs/Self Care Home Management;Therapeutic activities;Therapeutic exercise;Orthotic Fit/Training;Patient/family education;Manual techniques;Manual lymph drainage;Scar mobilization;Passive range of motion;Taping   PT Next Visit Plan Consider trying red Theraband for supine scapular series; continue manual therapy and exercise.   PT Home Exercise Plan Post op shoulder ROM HEP advised pt to not lift arms above 90 degrees until a week after drains removed, supine dowel; supine scapular series   Consulted and Agree with Plan of Care Patient      Patient will benefit from skilled therapeutic intervention in order to improve the following deficits and impairments:  Postural dysfunction, Decreased knowledge of precautions, Pain, Impaired UE functional use, Decreased range of motion, Decreased strength, Decreased scar mobility, Increased fascial restricitons  Visit Diagnosis: Stiffness of left shoulder, not elsewhere classified  Stiffness of right shoulder, not elsewhere classified  Abnormal posture     Problem List Patient Active Problem List   Diagnosis Date Noted  . Malignant neoplasm of breast in female, estrogen receptor positive ([place])   . Sepsis ([place]) 09/04/2017  . Neutropenic fever ([place]) 09/04/2017  . Hyponatremia 09/04/2017  . Port-A-Cath in place 08/28/2017  . Genetic testing 06/25/2017  . Family history of breast cancer   . Family history of colon cancer   . Malignant neoplasm of upper-outer quadrant of right breast in female, estrogen receptor positive ([place]) 06/16/2017    Kareema Keitt 09/15/2017, 4:21  PM  [place], [place], [address] Phone: [phone]   Fax:  [phone]  Name: Aleiya Rye MRN: [id number] Date of Birth: 1976/06/07  Serafina Royals, PT 09/15/17 4:21 PM

## 2017-09-16 ENCOUNTER — Telehealth: Payer: Self-pay | Admitting: *Deleted

## 2017-09-16 NOTE — Telephone Encounter (Signed)
-----   Message from [name], MD sent at 09/16/2017  2:46 PM EDT ----- Lavella Lemons, could you let her know that her K was normal yesterday? OK to stop KCL. Thanks   [name]  09/16/2017

## 2017-09-16 NOTE — Progress Notes (Signed)
[place]  [phone] [phone]  Clinic Follow Up Note   Patient Care Team: [name], MD as PCP - General (Family Medicine) [name] (Family Medicine) [name], MD as Consulting Physician (General Surgery) [name], MD as Consulting Physician (Hematology) Kyung Rudd, MD as Consulting Physician (Radiation Oncology) 09/19/2017  CHIEF COMPLAINTS:  Follow up right breast cancer    Oncology History    Cancer Staging Malignant neoplasm of upper-outer quadrant of right breast in female, estrogen receptor positive ([place]) Staging form: Breast, AJCC 8th Edition - Clinical stage from 06/11/2017: Stage IB (cT3, cN0, cM0, G2, ER: Positive, PR: Positive, HER2: Positive) - Signed by [name], MD on 06/16/2017 - Pathologic stage from 07/22/2017: Stage IB (pT3(m), pN2a(sn), cM0, G2, ER: Positive, PR: Positive, HER2: Positive) - Signed by [name], NP on 08/04/2017      Malignant neoplasm of upper-outer quadrant of right breast in female, estrogen receptor positive ([place])   06/11/2017 Initial Biopsy    Diagnosis 06/11/17 Breast, right, needle core biopsy, 11:00 o'clock - INVASIVE DUCTAL CARCINOMA, G2      06/11/2017 Mammogram    [name] and MM Diagnostic Breast Tomo Bilateral 06/11/17 IMPRESSION: 1. Highly suspicious mass within the retroareolar right breast, extending from the 8:00 to 11:00 axes, anterior to posterior depth, extending anteriorly to the nipple with associated nipple retraction, measuring at least 5.5 cm by ultrasound  2. No sonographic evidence of metastatic lymphadenopathy in the right axilla. 3. No evidence of malignancy within the left breast.       06/11/2017 Initial Diagnosis    Malignant neoplasm of upper-outer quadrant of right breast in female, estrogen receptor positive ([place])      06/11/2017 Receptors her2    Estrogen Receptor: 100%, POSITIVE, STRONG STAINING INTENSITY Progesterone Receptor: 100%,  POSITIVE, STRONG STAINING INTENSITY Proliferation Marker Ki67: 12%Proliferation Marker Ki67: 12%  HER2 - **POSITIVE** RATIO OF HER2/CEP17 SIGNALS 2.55 AVERAGE HER2 COPY NUMBER PER CELL 4.85      06/25/2017 Genetic Testing    Negative genetic testing on the 9 gene STAT panel.  The STAT Breast cancer panel offered by [facility] includes sequencing and rearrangement analysis for the following 9 genes:  ATM, BRCA1, BRCA2, CDH1, CHEK2, PALB2, PTEN, STK11 and TP53.   The report date is June 25, 2017.  Negative genetic testing on the common hereditary cancer panel.  The Hereditary Gene Panel offered by [facility] includes sequencing and/or deletion duplication testing of the following 46 genes: APC, ATM, AXIN2, BARD1, BMPR1A, BRCA1, BRCA2, BRIP1, CDH1, CDKN2A (p14ARF), CDKN2A (p16INK4a), CHEK2, CTNNA1, DICER1, EPCAM (Deletion/duplication testing only), GREM1 (promoter region deletion/duplication testing only), KIT, MEN1, MLH1, MSH2, MSH3, MSH6, MUTYH, NBN, NF1, NHTL1, PALB2, PDGFRA, PMS2, POLD1, POLE, PTEN, RAD50, RAD51C, RAD51D, SDHB, SDHC, SDHD, SMAD4, SMARCA4. STK11, TP53, TSC1, TSC2, and VHL.  The following genes were evaluated for sequence changes only: SDHA and HOXB13 c.251G>A variant only.  The report date is June 25, 2017.       07/02/2017 Imaging    CT cAP 07/02/17 IMPRESSION: 1. Subareolar right breast mass. No compelling findings of nodal or metastatic involvement. 2. Old granulomatous disease. 3. There is a 7 mm enhancing or hyperdense focus posteriorly in segment 7 of the liver in the subcapsular region. By virtue of its small size this lesion is technically nonspecific although statistically likely to be a small benign lesions such as flash filling hemangioma. This may warrant surveillance. 4.  Prominent stool throughout the colon favors constipation. 5. 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      07/02/2017 Imaging    Bone scan  07/02/17 IMPRESSION: Today' s exam is negative. I ascribed the tiny focus of activity just proximal to the left antecubital region to injection site.      07/22/2017 Surgery    RIGHT TOTAL MASTECTOMY WITH RIGHT AXILLARY SENTINEL LYMPH NODE BIOPSY, LEFT PROPHYLACTIC MASTECTOM and INSERTION PORT-A-CATH WITH [name] by [name] and [facility]       07/22/2017 Pathology Results    Diagnosis 1. Breast, simple mastectomy, Left - FIBROADENOMA. - NO MALIGNANCY IDENTIFIED. 2. 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 - ADDITIONAL FOCUS OF LOBULAR CARCINOMA, GRADE 2, SPANNING 2.2 CM. - INTERMEDIATE GRADE DUCTAL CARCINOMA IN SITU. - INVASIVE CARCINOMA COMES TO WITHIN 0.2 TO 0.3 CM OF THE DEEP MARGIN, FOCALLY. - TUMOR FOCALLY INVOLVES EPIDERMIS. - LYMPHOVASCULAR INVASION OF DERMAL LYMPHATICS. - ONE OF ONE LYMPH NODES NEGATIVE FOR CARCINOMA (0/1). - SEE ONCOLOGY TABLE. 3. Lymph node, sentinel, biopsy, Right axillary - ONE LYMPH NODE WITH ISOLATED TUMOR CELLS (0/1). 4. Lymph node, sentinel, biopsy, Right - METASTATIC CARCINOMA IN ONE OF ONE LYMPH NODES (1/1). 5. Lymph node, sentinel, biopsy, Right - ONE OF ONE LYMPH NODES NEGATIVE FOR CARCINOMA (0/1). 6. Lymph node, sentinel, biopsy, Right - METASTATIC CARCINOMA IN ONE OF ONE LYMPH NODES (1/1). - EXTRACAPSULAR EXTENSION. 7. Lymph node, sentinel, biopsy, Right - METASTATIC CARCINOMA IN ONE OF ONE LYMPH NODES (1/1). - EXTRACAPSULAR EXTENSION. 8. Lymph node, sentinel, biopsy, Right - ONE OF ONE LYMPH NODES NEGATIVE FOR CARCINOMA (0/1). 9. Lymph node, sentinel, biopsy, Right - ONE OF ONE LYMPH NODES NEGATIVE FOR CARCINOMA (0/1). 10. Lymph node, sentinel, biopsy, Right - METASTATIC CARCINOMA IN ONE OF ONE LYMPH NODES (1/1). - EXTRACAPSULAR EXTENSION. 11. Lymph node, biopsy, Right axillary - ONE OF ONE LYMPH NODES NEGATIVE FOR CARCINOMA (0/1).       07/22/2017 Receptors her2     Ductal carcinoma in primary breast tumor and node metastasis are ER 95%, PR 95% strongly positive, and HER2 + The lobular component in primary breast tumor is ER 95% positive, PR 95% positive, strong staining, HER-2 negative.       08/25/2017 Imaging    MRI abdomen done at [facility]:  Enhancing structure measing 5 mm is compatible with a flash filling hemangioma. No specific findings to suggest metastatic cancer      08/25/2017 Imaging    MRI Abdomen at [facility] 08/25/17  IMPRESSION: 1. Lesion in segment 7 of the liver has signal and enhancement characteristics compatible with a flash filling hemangioma. No specific findings identified to suggest metastatic disease.      08/29/2017 -  Chemotherapy    adjuvant TCHP every 3 weeks for 6 cycles starting 08/29/17, followed by maintenance Herceptin with or without Perjeta for 6-12 months. Held after cycle 1 due to Sepsis and hospitalization.         09/04/2017 - 09/09/2017 Hospital Admission    Admit date: 09/04/2017 Discharge date: 09/09/2017  DISCHARGE DIAGNOSES:  Principal Problem:   Sepsis [facility]) Active Problems:   Malignant neoplasm of upper-outer quadrant of right breast in female, estrogen receptor positive ([place])   Port-A-Cath in place   Neutropenic fever ([place])   Hyponatremia   Malignant neoplasm of breast in female, estrogen receptor positive ([place])       HISTORY OF PRESENTING ILLNESS: [birth year] Sheila [name] 41 y.o. female is here because of  newly diagnosed Malignant neoplasm of upper-outer quadrant of right breast. She presents to the breast clinic today with her common law husband. She felt the lump initially when she had her physical 2 weeks ago, she had felt her nipples retracted several years ago. A few months ago it has become more pronounced. She had no pain or discharge.   In the past was diagnosed with Vitamin D deficiency and high cholesterol. She previously was on mood stabilizing medication. She feel right now she  does not need that medication but is overwhelmed. She donated eggs and previously had kidney stones removed. Her mother had breast cancer and was diagnosed at 11 along with paternal grandmother in her 81's.   Today she has heart burn which she is taking Prilosec and will see her GI later today. She has trouble sleeping and uses tylenol pm. She tried [place] which did not help, same with melatonin. Her common law husband Is on disability.  GYN HISTORY  Menarchal: 19 LMP: July 23rd, 2018 Contraceptive: 20-29 on birth control pill HRT: NA G0P0: does not want kids  CURRENT THERAPY: adjuvant chemotherapy TCHP every 3 weeks for 6 cycles beginning 08/29/2017. Decrease Carbo with cycle 2 on 09/19/17 due to neutropenic fever. Plan to add Neulasta if approved    INTERVAL HISTORY:  Sheila [name] is here for a follow up. She presents to the clinic today accompanied by her husband.  She feels much better after hospitalization and her diarrhea is resolved. She is 80-90% back to normal, eating well. She wonders if her neulasta is approved and if not is possible to do Granix at home. Her diarrhea was significant in the hospital and she was constipated for several days before being admitted. She used Zofran for nausea and was given Phenergan in hospital which worked better and compazine worked fine. She started rehab and will continue for 4 more weeks. She denies any other concerns.    MEDICAL HISTORY:  Past Medical History:  Diagnosis Date  . Anxiety   . Cancer [facility])    right breast   . Family history of breast cancer   . Family history of colon cancer   . GERD (gastroesophageal reflux disease)   . Headache    couple of migraines in the past  . History of kidney stones   . Vitamin D deficiency    SURGICAL HISTORY: Past Surgical History:  Procedure Laterality Date  . egg donation    . kidney stone removal    . MASTECTOMY W/ SENTINEL NODE BIOPSY Bilateral 07/22/2017   Procedure: RIGHT TOTAL  MASTECTOMY WITH RIGHT AXILLARY SENTINEL LYMPH NODE BIOPSY, LEFT PROPHYLACTIC MASTECTOMY;  Surgeon: [name], MD;  Location: [place];  Service: General;  Laterality: Bilateral;  . PORTACATH PLACEMENT Right 07/22/2017   Procedure: INSERTION PORT-A-CATH WITH [name];  Surgeon: [name], MD;  Location: [place];  Service: General;  Laterality: Right;   SOCIAL HISTORY: Social History   Social History  . Marital status: Single    Spouse name: N/A  . Number of children: N/A  . Years of education: N/A   Occupational History  . Not on file.   Social History Main Topics  . Smoking status: Never Smoker  . Smokeless tobacco: Former Systems developer  . Alcohol use Yes     Comment: once a year  . Drug use: No  . Sexual activity: Yes   Other Topics Concern  . Not on file   Social History Narrative  . No narrative on  file   FAMILY HISTORY: Family History  Problem Relation Age of Onset  . Breast cancer Paternal Grandmother 65  . Colon cancer Paternal Grandmother 60  . Breast cancer Mother 16  . Prostate cancer Paternal Grandfather        dx in his 19s  . Leukemia Paternal Grandfather   . Lung cancer Maternal Uncle        heavy smoker  . Other Paternal Uncle        farm accident  . Stroke Maternal Grandfather    ALLERGIES:  has No Known Allergies.  MEDICATIONS:  Current Outpatient Prescriptions  Medication Sig Dispense Refill  . dexamethasone (DECADRON) 4 MG tablet Take 2 tablets (8 mg total) by mouth 2 (two) times daily. Start the day before Taxotere. Then again the day after chemo for 3 days. 30 tablet 1  . diphenhydramine-acetaminophen (TYLENOL PM) 25-500 MG TABS tablet Take 2 tablets by mouth at bedtime. sleep    . fluticasone (FLONASE) 50 MCG/ACT nasal spray Place 1 spray into both nostrils daily.    Marland Kitchen lidocaine-prilocaine (EMLA) cream Apply to affected area once 30 g 3  . loperamide (IMODIUM) 2 MG capsule Take 1 capsule (2 mg total) by mouth 3 (three) times daily as needed for  diarrhea or loose stools. 30 capsule 0  . prochlorperazine (COMPAZINE) 10 MG tablet Take 1 tablet (10 mg total) by mouth every 6 (six) hours as needed (Nausea or vomiting). 30 tablet 1  . promethazine (PHENERGAN) 12.5 MG tablet Take 1 tablet (12.5 mg total) by mouth every 6 (six) hours as needed for nausea or vomiting. 30 tablet 0  . calcium carbonate (TUMS - DOSED IN MG ELEMENTAL CALCIUM) 500 MG chewable tablet Chew 2 tablets by mouth as needed for indigestion or heartburn.     . Cholecalciferol (VITAMIN D) 2000 units tablet Take 2,000 Units by mouth daily.    . diphenoxylate-atropine (LOMOTIL) 2.5-0.025 MG tablet Take 1-2 tablets by mouth 4 (four) times daily as needed for diarrhea or loose stools. 30 tablet 2  . naproxen sodium (ANAPROX) 220 MG tablet Take 220 mg by mouth 2 (two) times daily with a meal.    . omeprazole (PRILOSEC) 20 MG capsule Take 20 mg by mouth at bedtime.      Current Facility-Administered Medications  Medication Dose Route Frequency Provider Last Rate Last Dose  . gi cocktail (Maalox,Lidocaine,Donnatal)  30 mL Oral Once Ceasar Mons., PA-C       Facility-Administered Medications Ordered in Other Visits  Medication Dose Route Frequency Provider Last Rate Last Dose  . sodium chloride flush (NS) 0.9 % injection 10 mL  10 mL Intracatheter PRN Malachy Mood, MD       REVIEW OF SYSTEMS:  Constitutional: Denies fevers, chills or abnormal night sweats  (+) fatigue Eyes: Denies blurriness of vision, double vision or watery eyes Ears, nose, mouth, throat, and face: Denies mucositis or sore throat Respiratory: Denies cough, dyspnea or wheezes Cardiovascular: Denies palpitation, chest discomfort or lower extremity swelling Gastrointestinal:  Denies nausea, heartburn or change in bowel habits  Skin: Denies abnormal skin rashes Lymphatics: Denies new lymphadenopathy or easy bruising Neurological:Denies numbness, tingling or new weaknesses Behavioral/Psych: Mood is stable, no new  changes  Breast: (+) bilateral mastectomy on 07/22/17 All other systems were reviewed with the patient and are negative.  PHYSICAL EXAMINATION: ECOG PERFORMANCE STATUS: 1 - Symptomatic but completely ambulatory  Vitals:   09/19/17 1032  BP: (!) 110/56  Pulse: 87  Resp: 18  Temp: 98.2 F (36.8 C)  SpO2: 100%   Filed Weights   09/19/17 1032  Weight: 159 lb 14.4 oz (72.5 kg)      GENERAL:alert, no distress and comfortable SKIN: skin color, texture, turgor are normal, no rashes or significant lesions EYES: normal, conjunctiva are pink and non-injected, sclera clear OROPHARYNX:no exudate, no erythema and lips, buccal mucosa, and tongue normal  NECK: supple, thyroid normal size, non-tender, without nodularity LYMPH:  no palpable lymphadenopathy in the cervical, axillary or inguinal LUNGS: clear to auscultation and percussion with normal breathing effort HEART: regular rate & rhythm and no murmurs and no lower extremity edema ABDOMEN:abdomen soft, non-tender and normal bowel sounds Musculoskeletal:no cyanosis of digits and no clubbing  PSYCH: alert & oriented x 3 with fluent speech NEURO: no focal motor/sensory deficits Breast: (+)bilateral mastectomy, incisions clean and intact, healing well. Bilateral drains present.   LABORATORY DATA:  I have reviewed the data as listed CBC Latest Ref Rng & Units 09/19/2017 09/15/2017 09/09/2017  WBC 3.9 - 10.3 10e3/uL 6.3 3.8(L) 19.5(H)  Hemoglobin 11.6 - 15.9 g/dL 9.5(L) 10.1(L) 8.1(L)  Hematocrit 34.8 - 46.6 % 28.5(L) 30.5(L) 23.7(L)  Platelets 145 - 400 10e3/uL 222 150 126(L)   CMP Latest Ref Rng & Units 09/19/2017 09/15/2017 09/09/2017  Glucose 70 - 140 mg/dl 179(H) 92 123(H)  BUN 7.0 - 26.0 mg/dL 10.6 10.3 5(L)  Creatinine 0.6 - 1.1 mg/dL 0.9 0.8 0.87  Sodium 136 - 145 mEq/L 139 139 145  Potassium 3.5 - 5.1 mEq/L 3.5 4.1 3.1(L)  Chloride 101 - 111 mmol/L - - 110  CO2 22 - 29 mEq/L 21(L) 24 28  Calcium 8.4 - 10.4 mg/dL 9.0 8.7 8.0(L)    Total Protein 6.4 - 8.3 g/dL 6.5 6.1(L) -  Total Bilirubin 0.20 - 1.20 mg/dL 0.40 0.26 -  Alkaline Phos 40 - 150 U/L 58 59 -  AST 5 - 34 U/L 22 15 -  ALT 0 - 55 U/L 47 19 -   PATHOLOGY  Diagnosis 07/22/17 1. Breast, simple mastectomy, Left - FIBROADENOMA. - NO MALIGNANCY IDENTIFIED. 2. 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 - ADDITIONAL FOCUS OF LOBULAR CARCINOMA, GRADE 2, SPANNING 2.2 CM. - INTERMEDIATE GRADE DUCTAL CARCINOMA IN SITU. - INVASIVE CARCINOMA COMES TO WITHIN 0.2 TO 0.3 CM OF THE DEEP MARGIN, FOCALLY. - TUMOR FOCALLY INVOLVES EPIDERMIS. - LYMPHOVASCULAR INVASION OF DERMAL LYMPHATICS. - ONE OF ONE LYMPH NODES NEGATIVE FOR CARCINOMA (0/1). - SEE ONCOLOGY TABLE. 3. Lymph node, sentinel, biopsy, Right axillary - ONE LYMPH NODE WITH ISOLATED TUMOR CELLS (0/1). 4. Lymph node, sentinel, biopsy, Right - METASTATIC CARCINOMA IN ONE OF ONE LYMPH NODES (1/1). 5. Lymph node, sentinel, biopsy, Right - ONE OF ONE LYMPH NODES NEGATIVE FOR CARCINOMA (0/1). 6. Lymph node, sentinel, biopsy, Right - METASTATIC CARCINOMA IN ONE OF ONE LYMPH NODES (1/1). - EXTRACAPSULAR EXTENSION. 7. Lymph node, sentinel, biopsy, Right - METASTATIC CARCINOMA IN ONE OF ONE LYMPH NODES (1/1). - EXTRACAPSULAR EXTENSION. 8. Lymph node, sentinel, biopsy, Right - ONE OF ONE LYMPH NODES NEGATIVE FOR CARCINOMA (0/1). 9. Lymph node, sentinel, biopsy, Right - ONE OF ONE LYMPH NODES NEGATIVE FOR CARCINOMA (0/1). 10. Lymph node, sentinel, biopsy, Right - METASTATIC CARCINOMA IN ONE OF ONE LYMPH NODES (1/1). - EXTRACAPSULAR EXTENSION. 11. Lymph node, biopsy, Right axillary - ONE OF ONE LYMPH NODES NEGATIVE FOR CARCINOMA (0/1). Microscopic Comment 2. BREAST, INVASIVE TUMOR Microscopic Comment(continued) Procedure: Bilateral simple mastectomies with right axillary  sentinel lymph node biopsies. Laterality: Right. Tumor Size: 5.3 cm. Histologic  Type: Mixed lobular and ductal carcinoma. Grade: 2 Tubular Differentiation: 2 Nuclear Pleomorphism: 3 Mitotic Count: 1 Ductal Carcinoma in Situ (DCIS): Present, intermediate grade. Extent of Tumor: Involves epidermis of nipple focally. Margins: Invasive carcinoma, distance from closest margin: 0.2-0.3 cm of posterior margin (smaller mass). DCIS, distance from closest margin: >0.5 cm all margins Regional Lymph Nodes: Number of Lymph Nodes Examined: 10 Number of Sentinel Lymph Nodes Examined: 9 Lymph Nodes with Macrometastases: 4 Lymph Nodes with Micrometastases: 0 Lymph Nodes with Isolated Tumor Cells: 1 Breast Prognostic Profile: Performed on biopsy ([id number]), see below. Will be performed on additional lobular focus. Estrogen Receptor: Positive, 100% strong staining. Progesterone Receptor: Positive, 100% strong staining. Her2: Positive (ratio 2.55). Ki-67: 12%. Best tumor block for sendout testing: 2B (larger focus), 2E (smaller focus). Pathologic Stage Classification (pTNM, AJCC 8th Edition): Primary Tumor (pT): mpT3 Regional Lymph Nodes (pN): pN2a Distant Metastases (pM): pMX 2. FLUORESCENCE IN-SITU HYBRIDIZATION Results: HER2 - NEGATIVE RATIO OF HER2/CEP17 SIGNALS 1.71 AVERAGE HER2 COPY NUMBER PER CELL 1.80 2. PROGNOSTIC INDICATORS Results: IMMUNOHISTOCHEMICAL AND MORPHOMETRIC ANALYSIS PERFORMED MANUALLY Estrogen Receptor: 95%, POSITIVE, STRONG STAINING INTENSITY Progesterone Receptor: 95%, POSITIVE, STRONG STAINING INTENSITY Proliferation Marker Ki67: 10%  Diagnosis 06/11/17 Breast, right, needle core biopsy, 11:00 o'clock - INVASIVE DUCTAL CARCINOMA, SEE COMMENT. Microscopic Comment The carcinoma appears grade 2. Prognostic markers will be ordered. Dr. Lyndon Code has reviewed the case. The case was called to [facility] on 06/12/2017. Results: HER2 - **POSITIVE** RATIO OF HER2/CEP17 SIGNALS 2.55 AVERAGE HER2 COPY NUMBER PER CELL  4.85  PROCEDURES  ECHO expected 08/12/17  RADIOGRAPHIC STUDIES: I have personally reviewed the radiological images as listed and agreed with the findings in the report. Ct Abdomen Pelvis W Contrast  Result Date: 09/05/2017 CLINICAL DATA:  Acute generalized abdominal pain. History of breast cancer. EXAM: CT ABDOMEN AND PELVIS WITH CONTRAST TECHNIQUE: Multidetector CT imaging of the abdomen and pelvis was performed using the standard protocol following bolus administration of intravenous contrast. CONTRAST:  154m ISOVUE-300 IOPAMIDOL (ISOVUE-300) INJECTION 61% COMPARISON:  MRI of August 25, 2017.  CT scan of July 02, 2017. FINDINGS: Lower chest: No acute abnormality. Hepatobiliary: No gallstones are noted. Stable 7 mm hyperdensity is noted posteriorly in right hepatic lobe most consistent with hemangioma based on recent MRI. No other abnormality seen in the hepatic parenchyma. Pancreas: Unremarkable. No pancreatic ductal dilatation or surrounding inflammatory changes. Spleen: Normal in size without focal abnormality. Adrenals/Urinary Tract: Adrenal glands are unremarkable. Kidneys are normal, without renal calculi, focal lesion, or hydronephrosis. Bladder is unremarkable. Stomach/Bowel: Stomach is within normal limits. Appendix appears normal. No evidence of bowel wall thickening, distention, or inflammatory changes. Vascular/Lymphatic: No significant vascular findings are present. No enlarged abdominal or pelvic lymph nodes. Reproductive: Uterus and left ovary unremarkable. 2.4 cm involuting cyst is noted in right ovary. Other: No abdominal wall hernia or abnormality. No abdominopelvic ascites. Musculoskeletal: No acute or significant osseous findings. IMPRESSION: Stable 7 mm hyperdensity seen in right hepatic lobe most consistent with hemangioma based on recent MRI. 2.4 cm right ovarian involuting cyst is noted. No other abnormality seen in the abdomen or pelvis. Electronically Signed   By: JMarijo Conception M.D.   On: 09/05/2017 16:20   Dg Chest Port 1 View  Result Date: 09/04/2017 CLINICAL DATA:  Breast cancer, first chemotherapy treatment 6 days ago. Fever, hypotensive and tachycardic. EXAM: PORTABLE CHEST 1 VIEW COMPARISON:  Chest radiograph July 22, 2017 FINDINGS: Cardiomediastinal silhouette is normal. No pleural effusions or focal consolidations. Trachea projects midline and there is no pneumothorax. Soft tissue planes and included osseous structures are non-suspicious. Single lumen RIGHT chest Port-A-Cath distal tip projects in mid superior vena cava. Bilateral mastectomies. Surgical clips RIGHT axilla. IMPRESSION: No acute cardiopulmonary process. Electronically Signed   By: Elon Alas M.D.   On: 09/04/2017 17:19   MRI Abdomen [facility] 08/25/17  IMPRESSION: 1. Lesion in segment 7 of the liver has signal and enhancement characteristics compatible with a flash filling hemangioma. No specific findings identified to suggest metastatic disease.    ASSESSMENT & PLAN:  Malaney Mcbean is a 41 y.o. premenopausal female with a history of Vitamin D deficiency and high cholesterol and kidney stones, presented with a palpable right breast mass.   1. Malignant neoplasm of upper-outer quadrant of right breast in female, mixed invasive ductal and lobular carcinoma, pT3(m)N2aM0, stage 1b, ductal carcinoma tripple positive, lobular carcinoma ER+/PR+/HER2-,  Grade 2 -She underwent staging CT and bone scan on 07/02/2017 prior to surgery, both were negative for metastasis. -There was a 78m enhancing or hyperdense focus posteriorly in the liver which is technically nonspecific, and her abdominal MRI showed this lesion is likely hemangioma. -I reviewed the surgical pathology with the patient and her husband in detail.  -Final diagnosis from the right simple mastectomy and sentinel lymph node biopsy on 07/22/2017 revealed mixed invasive lobular and ductal carcinoma, grade 2, spanning 5.3 cm. There  is an additional focus of lobular carcinoma, grade 2, spanning 2.2cm and it is ER+/PR+/HER2-. 4 of 10 lymph nodes tested positive for macrometastases and they are HER2+ ductal carcinoma.  -I also discussed the possibility of oral anti-HER2 therapy after she completes Herceptin maintenance therapy. -We reviewed the risk of cancer recurrence after complete surgical resection. Giving the large size of tumor, multiple positive lymph nodes, HER-2 positive disease, she has very high risk for recurrence. I strongly recommend adjuvant chemotherapy to reduce her risk of recurrence.  -She initially preferred reconstruction. She saw Dr. TIran Planasbut ultimately did not opt for reconstruction.  -Given her locally advanced disease, I recommend adjuvant docetaxel, carboplatin, Herceptin and pejeta (TCHP) every 3 weeks for 6 cycles, followed by maintenance Herceptin with Perjeta for 12 months -Given her ER/PR positive tumor, I recommend adjuvant antiestrogen therapy with tamoxifen, starting after her adjuvant irradiation. Benefit and the side effects reviewed with her, she is interested. -PAC placed during surgery, Chest xray confirms tip placement in the SVC. -Baseline echo was normal, she saw Dr. MAundra Dubinon 08/12/2017 -[facility] [place] developed neutropenic fever and diarrhea afterwards and was hospitalized. -Due to her neutropenic fever after first cycle we will decrease carboplatin dose starting with cycle 2 on 09/19/17 and will add neualsta.  However her insurance again did not Neulasta, our pharmacy is working on drug replacement.  Plan to give her on Protonix. -We again discussed the management of nausea and diarrhea. -I encourage her watch her mouth for thrush as it can lead to infection. I advised her to keep mouth clean with biotin mouth wash.  -Labs reviewed, Hg is 9.5, no need for blood transfusion, ANC and WBC normal. Labs adequate to proceed with treatment.  -F/u next week for toxicity checkup  and IV fluids if needed.  2. Genetics -Due to her family history of breast cancer, she agreed to a genetics referral -She was negative for all mutations tested   3. Hyperglycemia -she has no personal  history of diabetes -we discussed BG can increase during chemotherapy especially in the setting of steroid premeds -she can monitor at home with her husband's glucometer, she will notify us if consistently elevated or over 140 postprandial -BG 151 on 08/28/17 -She is on dexamethasone so I suggest her to closely monitor her BG at home and if high we can reduce her dose. Decrease dexamethasone to 1 tab in the AM and 2 tabs in the PM for 3 days after each treatment due to her hyperglycemia. She can take 1 additional pill as needed for her nausea.  4. Social work, Insurance underwriter -she has insurance that only approved herceptin and Perjeta for 5 treatments; we will need to extend this request as she will need therapy for total of 12 months -she is requesting a letter for her insurance company indicating why she needs echocardiograms and why they need to be performed by our physicians; I will provide that for her.   5. Febrile Neutropenia  -on 09/04/17 pt presented with neutropenic fever, tachycardia, tachypnea, hypotension, and lethargy. Tmax 100.8 in clinic.  -She was transported to ED for code sepsis and was treated with antibiotics  -Neulasta was denied by her insurance, she received Granix after first cycle chemo, we will give Neulasta after cycle 2 chemotherapy.  6. Nausea/Diarrhea -I explained to use imodium at the first sign of diarrhea, up to 8. If diarrhea persist I recommend lomotil which I prescribed on 09/19/17.  -I suggest she uses Phenergan or compazine for antiemetics, I encouraged her not to take at the same time.   PLAN:  -Labs reviewed, adequate for treatment, will proceed cycle 2 TCH P today, carboplatin dose reduced to AUC 5.5, with onpro  -prescribe Lomotil today  -Provided letter  to pt for her insurance to approve ECHO  -Lab, flush, f/u with lacie or me in 6-7 days and IVF 2 hrs  All questions were answered. The patient knows to call the clinic with any problems, questions or concerns. I spent 30 minutes counseling the patient face to face. The total time spent in the appointment was 40 minutes and more than 50% was on counseling.   [name]  09/19/2017

## 2017-09-16 NOTE — Telephone Encounter (Signed)
Pt notified of normal potassium. Informed her she can stop supplement. She voiced understanding.

## 2017-09-17 ENCOUNTER — Other Ambulatory Visit: Payer: Self-pay | Admitting: Hematology

## 2017-09-19 ENCOUNTER — Ambulatory Visit: Payer: PRIVATE HEALTH INSURANCE

## 2017-09-19 ENCOUNTER — Other Ambulatory Visit (HOSPITAL_BASED_OUTPATIENT_CLINIC_OR_DEPARTMENT_OTHER): Payer: PRIVATE HEALTH INSURANCE

## 2017-09-19 ENCOUNTER — Ambulatory Visit (HOSPITAL_BASED_OUTPATIENT_CLINIC_OR_DEPARTMENT_OTHER): Payer: PRIVATE HEALTH INSURANCE

## 2017-09-19 ENCOUNTER — Ambulatory Visit (HOSPITAL_BASED_OUTPATIENT_CLINIC_OR_DEPARTMENT_OTHER): Payer: PRIVATE HEALTH INSURANCE | Admitting: Hematology

## 2017-09-19 ENCOUNTER — Encounter: Payer: Self-pay | Admitting: Hematology

## 2017-09-19 VITALS — BP 110/56 | HR 87 | Temp 98.2°F | Resp 18 | Ht 66.0 in | Wt 159.9 lb

## 2017-09-19 DIAGNOSIS — Z5111 Encounter for antineoplastic chemotherapy: Secondary | ICD-10-CM | POA: Diagnosis not present

## 2017-09-19 DIAGNOSIS — Z170 Estrogen receptor positive status [ER+]: Principal | ICD-10-CM

## 2017-09-19 DIAGNOSIS — C50411 Malignant neoplasm of upper-outer quadrant of right female breast: Secondary | ICD-10-CM | POA: Diagnosis not present

## 2017-09-19 DIAGNOSIS — Z5189 Encounter for other specified aftercare: Secondary | ICD-10-CM | POA: Diagnosis not present

## 2017-09-19 DIAGNOSIS — Z95828 Presence of other vascular implants and grafts: Secondary | ICD-10-CM

## 2017-09-19 LAB — CBC WITH DIFFERENTIAL/PLATELET
BASO%: 0.2 % (ref 0.0–2.0)
BASOS ABS: 0 10*3/uL (ref 0.0–0.1)
EOS ABS: 0 10*3/uL (ref 0.0–0.5)
EOS%: 0 % (ref 0.0–7.0)
HCT: 28.5 % — ABNORMAL LOW (ref 34.8–46.6)
HGB: 9.5 g/dL — ABNORMAL LOW (ref 11.6–15.9)
LYMPH#: 0.7 10*3/uL — AB (ref 0.9–3.3)
LYMPH%: 10.5 % — ABNORMAL LOW (ref 14.0–49.7)
MCH: 30.7 pg (ref 25.1–34.0)
MCHC: 33.3 g/dL (ref 31.5–36.0)
MCV: 91.9 fL (ref 79.5–101.0)
MONO#: 0.3 10*3/uL (ref 0.1–0.9)
MONO%: 4.4 % (ref 0.0–14.0)
NEUT%: 84.9 % — AB (ref 38.4–76.8)
NEUTROS ABS: 5.3 10*3/uL (ref 1.5–6.5)
Platelets: 222 10*3/uL (ref 145–400)
RBC: 3.1 10*6/uL — AB (ref 3.70–5.45)
RDW: 13.5 % (ref 11.2–14.5)
WBC: 6.3 10*3/uL (ref 3.9–10.3)

## 2017-09-19 LAB — COMPREHENSIVE METABOLIC PANEL
ALT: 47 U/L (ref 0–55)
AST: 22 U/L (ref 5–34)
Albumin: 3.4 g/dL — ABNORMAL LOW (ref 3.5–5.0)
Alkaline Phosphatase: 58 U/L (ref 40–150)
Anion Gap: 10 mEq/L (ref 3–11)
BILIRUBIN TOTAL: 0.4 mg/dL (ref 0.20–1.20)
BUN: 10.6 mg/dL (ref 7.0–26.0)
CO2: 21 meq/L — AB (ref 22–29)
Calcium: 9 mg/dL (ref 8.4–10.4)
Chloride: 108 mEq/L (ref 98–109)
Creatinine: 0.9 mg/dL (ref 0.6–1.1)
GLUCOSE: 179 mg/dL — AB (ref 70–140)
Potassium: 3.5 mEq/L (ref 3.5–5.1)
SODIUM: 139 meq/L (ref 136–145)
TOTAL PROTEIN: 6.5 g/dL (ref 6.4–8.3)

## 2017-09-19 MED ORDER — DEXAMETHASONE SODIUM PHOSPHATE 10 MG/ML IJ SOLN
10.0000 mg | Freq: Once | INTRAMUSCULAR | Status: AC
  Administered 2017-09-19: 10 mg via INTRAVENOUS

## 2017-09-19 MED ORDER — SODIUM CHLORIDE 0.9 % IV SOLN
650.0000 mg | Freq: Once | INTRAVENOUS | Status: AC
  Administered 2017-09-19: 650 mg via INTRAVENOUS
  Filled 2017-09-19: qty 65

## 2017-09-19 MED ORDER — ACETAMINOPHEN 325 MG PO TABS
650.0000 mg | ORAL_TABLET | Freq: Once | ORAL | Status: AC
  Administered 2017-09-19: 650 mg via ORAL

## 2017-09-19 MED ORDER — DIPHENHYDRAMINE HCL 25 MG PO CAPS
50.0000 mg | ORAL_CAPSULE | Freq: Once | ORAL | Status: AC
  Administered 2017-09-19: 50 mg via ORAL

## 2017-09-19 MED ORDER — SODIUM CHLORIDE 0.9 % IV SOLN: 713.4000 mg | Freq: Once | INTRAVENOUS | Status: DC

## 2017-09-19 MED ORDER — SODIUM CHLORIDE 0.9 % IV SOLN
75.0000 mg/m2 | Freq: Once | INTRAVENOUS | Status: AC
  Administered 2017-09-19: 140 mg via INTRAVENOUS
  Filled 2017-09-19: qty 14

## 2017-09-19 MED ORDER — DIPHENOXYLATE-ATROPINE 2.5-0.025 MG PO TABS: 1.0000 | ORAL_TABLET | Freq: Four times a day (QID) | ORAL | 2 refills | Status: DC | PRN

## 2017-09-19 MED ORDER — SODIUM CHLORIDE 0.9% FLUSH
10.0000 mL | Freq: Once | INTRAVENOUS | Status: AC
  Administered 2017-09-19: 10 mL
  Filled 2017-09-19: qty 10

## 2017-09-19 MED ORDER — PEGFILGRASTIM 6 MG/0.6ML ~~LOC~~ PSKT
6.0000 mg | PREFILLED_SYRINGE | Freq: Once | SUBCUTANEOUS | Status: AC
  Administered 2017-09-19: 6 mg via SUBCUTANEOUS
  Filled 2017-09-19: qty 0.6

## 2017-09-19 MED ORDER — SODIUM CHLORIDE 0.9 % IV SOLN
Freq: Once | INTRAVENOUS | Status: AC
  Administered 2017-09-19: 12:00:00 via INTRAVENOUS

## 2017-09-19 MED ORDER — ACETAMINOPHEN 325 MG PO TABS
ORAL_TABLET | ORAL | Status: AC
  Filled 2017-09-19: qty 2

## 2017-09-19 MED ORDER — DIPHENHYDRAMINE HCL 25 MG PO CAPS
ORAL_CAPSULE | ORAL | Status: AC
  Filled 2017-09-19: qty 2

## 2017-09-19 MED ORDER — PERTUZUMAB CHEMO INJECTION 420 MG/14ML
420.0000 mg | Freq: Once | INTRAVENOUS | Status: AC
  Administered 2017-09-19: 420 mg via INTRAVENOUS
  Filled 2017-09-19: qty 14

## 2017-09-19 MED ORDER — PALONOSETRON HCL INJECTION 0.25 MG/5ML
0.2500 mg | Freq: Once | INTRAVENOUS | Status: AC
  Administered 2017-09-19: 0.25 mg via INTRAVENOUS

## 2017-09-19 MED ORDER — DEXAMETHASONE SODIUM PHOSPHATE 10 MG/ML IJ SOLN
INTRAMUSCULAR | Status: AC
  Filled 2017-09-19: qty 1

## 2017-09-19 MED ORDER — PALONOSETRON HCL INJECTION 0.25 MG/5ML
INTRAVENOUS | Status: AC
  Filled 2017-09-19: qty 5

## 2017-09-19 MED ORDER — TRASTUZUMAB CHEMO 150 MG IV SOLR
450.0000 mg | Freq: Once | INTRAVENOUS | Status: AC
  Administered 2017-09-19: 450 mg via INTRAVENOUS
  Filled 2017-09-19: qty 21.43

## 2017-09-19 MED ORDER — HEPARIN SOD (PORK) LOCK FLUSH 100 UNIT/ML IV SOLN
500.0000 [IU] | Freq: Once | INTRAVENOUS | Status: AC | PRN
  Administered 2017-09-19: 500 [IU]
  Filled 2017-09-19: qty 5

## 2017-09-19 MED ORDER — SODIUM CHLORIDE 0.9% FLUSH
10.0000 mL | INTRAVENOUS | Status: DC | PRN
  Filled 2017-09-19: qty 10

## 2017-09-19 NOTE — Patient Instructions (Signed)
[facility] Discharge Instructions for Patients Receiving Chemotherapy  Today you received the following chemotherapy agents: Herceptin, Perjeta, Taxotere, Carboplatin.  To help prevent nausea and vomiting after your treatment, we encourage you to take your nausea medication as prescribed. If you develop nausea and vomiting that is not controlled by your nausea medication, call the clinic.   BELOW ARE SYMPTOMS THAT SHOULD BE REPORTED IMMEDIATELY:  *FEVER GREATER THAN 100.5 F  *CHILLS WITH OR WITHOUT FEVER  NAUSEA AND VOMITING THAT IS NOT CONTROLLED WITH YOUR NAUSEA MEDICATION  *UNUSUAL SHORTNESS OF BREATH  *UNUSUAL BRUISING OR BLEEDING  TENDERNESS IN MOUTH AND THROAT WITH OR WITHOUT PRESENCE OF ULCERS  *URINARY PROBLEMS  *BOWEL PROBLEMS  UNUSUAL RASH Items with * indicate a potential emergency and should be followed up as soon as possible.  Feel free to call the clinic should you have any questions or concerns. The clinic phone number is [phone].  Please show the CHEMO ALERT CARD at check-in to the Emergency Department and triage nurse.   

## 2017-09-22 ENCOUNTER — Encounter: Payer: Self-pay | Admitting: Physical Therapy

## 2017-09-22 ENCOUNTER — Ambulatory Visit: Payer: PRIVATE HEALTH INSURANCE | Attending: General Surgery | Admitting: Physical Therapy

## 2017-09-22 DIAGNOSIS — M25611 Stiffness of right shoulder, not elsewhere classified: Secondary | ICD-10-CM | POA: Diagnosis present

## 2017-09-22 DIAGNOSIS — M25612 Stiffness of left shoulder, not elsewhere classified: Secondary | ICD-10-CM | POA: Insufficient documentation

## 2017-09-22 DIAGNOSIS — R293 Abnormal posture: Secondary | ICD-10-CM | POA: Insufficient documentation

## 2017-09-22 DIAGNOSIS — M6281 Muscle weakness (generalized): Secondary | ICD-10-CM | POA: Diagnosis present

## 2017-09-22 NOTE — Therapy (Signed)
[place], [place], [address] Phone: [phone]   Fax:  [phone]  Physical Therapy Treatment  Patient Details  Name: Sheila [name] MRN: [id number] Date of Birth: September 26, 1976 Referring Provider: Dr. [name]   Encounter Date: 09/22/2017  PT End of Session - 09/22/17 1702    Visit Number  10    Number of Visits  17    Date for PT Re-Evaluation  10/08/17    PT Start Time  4332    PT Stop Time  1432    PT Time Calculation (min)  39 min    Activity Tolerance  Patient tolerated treatment well    Behavior During Therapy  [facility] for tasks assessed/performed       Past Medical History:  Diagnosis Date  . Anxiety   . Cancer [facility])    right breast   . Family history of breast cancer   . Family history of colon cancer   . GERD (gastroesophageal reflux disease)   . Headache    couple of migraines in the past  . History of kidney stones   . Vitamin D deficiency     Past Surgical History:  Procedure Laterality Date  . egg donation    . kidney stone removal      There were no vitals filed for this visit.  Subjective Assessment - 09/22/17 1354    Subjective  I am doing the new exercises. They are going good.     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 It measures 5.5 cm and is located in the upper outer quadrant with a Ki67 of 12%. , Bilateral mastectomies on 07/22/17 with SLNB on R with 2 to 3 additional nodes taken, pt has to complete chemo and radiation and will begin chemo on Aug 29, 2017    Patient Stated Goals  to return to prior level of function, be able to sleep with no discomfort, get ROM back to where it was    Currently in Pain?  No/denies    Pain Score  0-No pain                      [facility] Adult PT Treatment/Exercise - 09/22/17 0001      Shoulder Exercises: Supine   Horizontal ABduction  Strengthening;Both;Theraband;5 reps    Theraband Level (Shoulder Horizontal ABduction)  Level 2 (Red)    External Rotation  Strengthening;Both;Theraband;5 reps    Theraband Level (Shoulder External Rotation)  Level 2 (Red)    Flexion  Strengthening;Both;Theraband;5 reps narrow and wide grip   narrow and wide grip   Theraband Level (Shoulder Flexion)  Level 2 (Red)    Other Supine Exercises  D2 x 10 reps bilaterally with red theraband    Other Supine Exercises  with shoulders in approx. 90 degrees abduction, lower trunk rotation for chest stretch 30 seconds x 2 each way; horizontal abduction stretch x 30 seconds      Shoulder Exercises: Pulleys   Flexion  2 minutes with stretch at end range   with stretch at end range   ABduction  2 minutes with stretch at end range   with stretch at end range     Shoulder Exercises: Therapy Ball   Flexion  10 reps with stretch at end   with stretch at end   ABduction  10 reps on left and right  on left and right     Manual Therapy   Soft tissue mobilization  to right chest area around incision    Passive ROM  within pain limits for right shoulder in flexion, abduction, ER, and IR                   Breast Clinic Goals - 06/18/17 1221      Patient will be able to verbalize understanding of pertinent lymphedema risk reduction practices relevant to her diagnosis specifically related to skin care.   Time  1    Period  Days    Status  Achieved      Patient will be able to return demonstrate and/or verbalize understanding of the post-op home exercise program related to regaining shoulder range of motion.   Time  1    Period  Days    Status  Achieved      Patient will be able to verbalize understanding of the importance of attending the postoperative After Breast Cancer Class for further lymphedema risk reduction education and therapeutic exercise.   Time  1    Period  Days    Status  Achieved       Long Term  Clinic Goals - 09/10/17 1539      CC Long Term Goal  #1   Title  Pt to demonstrate 165 degrees of bilateral shoulder flexion to allow her to reach up and overhead    Baseline  R 98, L 125, 08/25/17- R 122, L 141, 09/10/17- 132 on R, 148 on L     Time  4    Period  Weeks    Status  On-going    Target Date  10/08/17      CC Long Term Goal  #2   Title  Pt to demonstrate 165 degrees of bilateral shoulder abduction to allow pt to reach out to sides    Baseline  R 74, L 86, 08/25/17- R 92, L 141, 09/10/17- 112 on R, 144 on L    Time  4    Period  Weeks    Status  On-going    Target Date  10/08/17      CC Long Term Goal  #3   Title  Pt to demonstrate 70 degrees of right shoulder internal rotation to allow her to return to prior level of function    Baseline  48, 08/25/17- 51, 09/10/17- 71 degrees    Time  4    Period  Weeks    Status  Achieved      CC Long Term Goal  #4   Title  Pt to report a 75% improvement in right trunk edema to allow improved comfort and sleep    Baseline  08/25/17- 60%, 09/10/17- 70%    Time  4    Period  Weeks    Status  On-going    Target Date  10/08/17      CC Long Term Goal  #5   Title  Pt to be independent in a home exercise program for continued strengthening and stretching    Time  4    Period  Weeks    Status  On-going         Plan - 09/22/17 1703    Clinical Impression Statement  Continued with exercises and PROM today. Upgraded supine scapular exercises to red theraband today and pt did well with tihs. She is still limited with right shoulder PROM due to tightness in  right axilla which almost resembles some cording in this area.     Rehab Potential  Good    Clinical Impairments Affecting Rehab Potential  pt to begin chemo Oct 12 and will have it every 3 weeks     PT Frequency  2x / week    PT Duration  4 weeks    PT Treatment/Interventions  ADLs/Self Care Home Management;Therapeutic activities;Therapeutic exercise;Orthotic  Fit/Training;Patient/family education;Manual techniques;Manual lymph drainage;Scar mobilization;Passive range of motion;Taping    PT Next Visit Plan  continue manual therapy and exercise, myofascial to right axilla, supine scap    PT Home Exercise Plan  Post op shoulder ROM HEP advised pt to not lift arms above 90 degrees until a week after drains removed, supine dowel; supine scapular series    Consulted and Agree with Plan of Care  Patient       Patient will benefit from skilled therapeutic intervention in order to improve the following deficits and impairments:  Postural dysfunction, Decreased knowledge of precautions, Pain, Impaired UE functional use, Decreased range of motion, Decreased strength, Decreased scar mobility, Increased fascial restricitons  Visit Diagnosis: Stiffness of left shoulder, not elsewhere classified  Stiffness of right shoulder, not elsewhere classified  Abnormal posture  Muscle weakness (generalized)     Problem List Patient Active Problem List   Diagnosis Date Noted  . Sepsis ([place]) 09/04/2017  . Neutropenic fever ([place]) 09/04/2017  . Hyponatremia 09/04/2017  . Port-A-Cath in place 08/28/2017  . Genetic testing 06/25/2017  . Family history of breast cancer   . Family history of colon cancer   . Malignant neoplasm of upper-outer quadrant of right breast in female, estrogen receptor positive ([place]) 06/16/2017    Allyson Sabal [facility] 09/22/2017, 5:06 PM  [place], [place], [address] Phone: [phone]   Fax:  [phone]  Name: Sheila [name] MRN: [id number] Date of Birth: 14-Jul-1976  Manus Gunning, PT 09/22/17 5:06 PM

## 2017-09-23 ENCOUNTER — Emergency Department (HOSPITAL_COMMUNITY): Payer: PRIVATE HEALTH INSURANCE

## 2017-09-23 ENCOUNTER — Encounter (HOSPITAL_COMMUNITY): Payer: Self-pay

## 2017-09-23 ENCOUNTER — Emergency Department (HOSPITAL_COMMUNITY)
Admission: EM | Admit: 2017-09-23 | Discharge: 2017-09-24 | Disposition: A | Discharge status: Home / Self Care | Payer: PRIVATE HEALTH INSURANCE | Attending: Physician Assistant | Admitting: Physician Assistant

## 2017-09-23 ENCOUNTER — Other Ambulatory Visit: Payer: Self-pay

## 2017-09-23 DIAGNOSIS — R531 Weakness: Secondary | ICD-10-CM | POA: Diagnosis not present

## 2017-09-23 DIAGNOSIS — Z79899 Other long term (current) drug therapy: Secondary | ICD-10-CM | POA: Diagnosis not present

## 2017-09-23 DIAGNOSIS — E876 Hypokalemia: Secondary | ICD-10-CM

## 2017-09-23 LAB — I-STAT CG4 LACTIC ACID, ED: Lactic Acid, Venous: 2.11 mmol/L (ref 0.5–1.9)

## 2017-09-23 LAB — COMPREHENSIVE METABOLIC PANEL
ALK PHOS: 66 U/L (ref 38–126)
ALT: 23 U/L (ref 14–54)
AST: 18 U/L (ref 15–41)
Albumin: 4 g/dL (ref 3.5–5.0)
Anion gap: 12 (ref 5–15)
BUN: 24 mg/dL — AB (ref 6–20)
CO2: 23 mmol/L (ref 22–32)
CREATININE: 1.06 mg/dL — AB (ref 0.44–1.00)
Calcium: 8.7 mg/dL — ABNORMAL LOW (ref 8.9–10.3)
Chloride: 100 mmol/L — ABNORMAL LOW (ref 101–111)
GFR calc Af Amer: >60 mL/min (ref >60)
GLUCOSE: 143 mg/dL — AB (ref 65–99)
POTASSIUM: 2.9 mmol/L — AB (ref 3.5–5.1)
SODIUM: 135 mmol/L (ref 135–145)
TOTAL PROTEIN: 7.4 g/dL (ref 6.5–8.1)
Total Bilirubin: 0.9 mg/dL (ref 0.3–1.2)

## 2017-09-23 LAB — CBC WITH DIFFERENTIAL/PLATELET
BASOS ABS: 0.1 10*3/uL (ref 0.0–0.1)
Basophils Relative: 1 %
EOS ABS: 0 10*3/uL (ref 0.0–0.7)
EOS PCT: 0 %
HEMATOCRIT: 31.7 % — AB (ref 36.0–46.0)
Hemoglobin: 10.7 g/dL — ABNORMAL LOW (ref 12.0–15.0)
LYMPHS ABS: 1.7 10*3/uL (ref 0.7–4.0)
LYMPHS PCT: 30 %
MCH: 30.5 pg (ref 26.0–34.0)
MCHC: 33.8 g/dL (ref 30.0–36.0)
MCV: 90.3 fL (ref 78.0–100.0)
MONO ABS: 0.1 10*3/uL (ref 0.1–1.0)
Monocytes Relative: 2 %
Neutro Abs: 3.9 10*3/uL (ref 1.7–7.7)
Neutrophils Relative %: 67 %
Platelets: 234 10*3/uL (ref 150–400)
RBC: 3.51 MIL/uL — ABNORMAL LOW (ref 3.87–5.11)
RDW: 13 % (ref 11.5–15.5)
WBC: 5.8 10*3/uL (ref 4.0–10.5)

## 2017-09-23 MED ORDER — SODIUM CHLORIDE 0.9 % IV BOLUS (SEPSIS)
1000.0000 mL | Freq: Once | INTRAVENOUS | Status: AC
  Administered 2017-09-23: 1000 mL via INTRAVENOUS

## 2017-09-23 MED ORDER — POTASSIUM CHLORIDE 10 MEQ/100ML IV SOLN
10.0000 meq | Freq: Once | INTRAVENOUS | Status: AC
  Administered 2017-09-23: 10 meq via INTRAVENOUS
  Filled 2017-09-23: qty 100

## 2017-09-23 MED ORDER — POTASSIUM CHLORIDE CRYS ER 20 MEQ PO TBCR
40.0000 meq | EXTENDED_RELEASE_TABLET | Freq: Once | ORAL | Status: AC
  Administered 2017-09-23: 40 meq via ORAL
  Filled 2017-09-23: qty 2

## 2017-09-23 MED ORDER — LOPERAMIDE HCL 2 MG PO CAPS
2.0000 mg | ORAL_CAPSULE | Freq: Once | ORAL | Status: AC
  Administered 2017-09-23: 2 mg via ORAL
  Filled 2017-09-23: qty 1

## 2017-09-23 MED ORDER — MAGNESIUM OXIDE 400 (241.3 MG) MG PO TABS
800.0000 mg | ORAL_TABLET | Freq: Once | ORAL | Status: DC
  Filled 2017-09-23: qty 2

## 2017-09-23 MED ORDER — POTASSIUM CHLORIDE ER 10 MEQ PO TBCR: 40.0000 meq | EXTENDED_RELEASE_TABLET | Freq: Every day | ORAL | 0 refills | Status: DC

## 2017-09-23 NOTE — ED Notes (Signed)
Patient attempted to give urine specimen but was unsuccessful. Will re-attempt.

## 2017-09-23 NOTE — ED Provider Notes (Signed)
[facility] DEPT Provider Note   CSN: [id number] Arrival date & time: 09/23/17  1943     History   Chief Complaint Chief Complaint  Patient presents with  . Weakness    HPI Sheila [name] is a 41 y.o. female.  HPI   Patient is a 41 year old female with past medical history significant for breast cancer.  Patient is undergoing chemotherapy.  Patient had recent bout with fever, sepsis several weeks ago.  Patient's been feeling in her normal state of health.  Patient took therapy last on Saturday.  Today was at dinner and all of a sudden felt ill after eating.  Had diarrhea on arrival here.  No fevers.  Otherwise has been feeling fine.  Currently she just feels exhausted.  Because she had recently had sepsis patient and family member were concerned that she was having another infection so came here to be evaluated.    Past Medical History:  Diagnosis Date  . Anxiety   . Cancer [facility])    right breast   . Family history of breast cancer   . Family history of colon cancer   . GERD (gastroesophageal reflux disease)   . Headache    couple of migraines in the past  . History of kidney stones   . Vitamin D deficiency     Patient Active Problem List   Diagnosis Date Noted  . Sepsis ([place]) 09/04/2017  . Neutropenic fever ([place]) 09/04/2017  . Hyponatremia 09/04/2017  . Port-A-Cath in place 08/28/2017  . Genetic testing 06/25/2017  . Family history of breast cancer   . Family history of colon cancer   . Malignant neoplasm of upper-outer quadrant of right breast in female, estrogen receptor positive ([place]) 06/16/2017    Past Surgical History:  Procedure Laterality Date  . egg donation    . kidney stone removal      OB History    No data available       Home Medications    Prior to Admission medications   Medication Sig Start Date End Date Taking? Authorizing Provider  calcium carbonate (TUMS - DOSED IN MG ELEMENTAL CALCIUM) 500 MG  chewable tablet Chew 2 tablets by mouth as needed for indigestion or heartburn.    Yes [provider]  Cholecalciferol (VITAMIN D) 2000 units tablet Take 2,000 Units by mouth daily.   Yes [provider]  dexamethasone (DECADRON) 4 MG tablet Take 2 tablets (8 mg total) by mouth 2 (two) times daily. Start the day before Taxotere. Then again the day after chemo for 3 days. 08/05/17  Yes [name], MD  diphenhydramine-acetaminophen (TYLENOL PM) 25-500 MG TABS tablet Take 2 tablets by mouth at bedtime. sleep   Yes [provider]  diphenoxylate-atropine (LOMOTIL) 2.5-0.025 MG tablet Take 1-2 tablets by mouth 4 (four) times daily as needed for diarrhea or loose stools. 09/19/17  Yes [name], MD  fluticasone (FLONASE) 50 MCG/ACT nasal spray Place 1 spray into both nostrils daily.   Yes [provider]  lidocaine-prilocaine (EMLA) cream Apply to affected area once 08/05/17  Yes [name], MD  loperamide (IMODIUM) 2 MG capsule Take 1 capsule (2 mg total) by mouth 3 (three) times daily as needed for diarrhea or loose stools. 09/09/17  Yes Bonnielee Haff, MD  naproxen sodium (ANAPROX) 220 MG tablet Take 220 mg 2 (two) times daily as needed by mouth (pain).    Yes [provider]  omeprazole (PRILOSEC) 20 MG capsule Take 20  mg by mouth at bedtime.    Yes [provider]  prochlorperazine (COMPAZINE) 10 MG tablet Take 1 tablet (10 mg total) by mouth every 6 (six) hours as needed (Nausea or vomiting). 08/05/17  Yes [name], MD  promethazine (PHENERGAN) 12.5 MG tablet Take 1 tablet (12.5 mg total) by mouth every 6 (six) hours as needed for nausea or vomiting. 09/09/17  Yes Bonnielee Haff, MD    Family History Family History  Problem Relation Age of Onset  . Breast cancer Paternal Grandmother 34  . Colon cancer Paternal Grandmother 12  . Breast cancer Mother 70  . Prostate cancer Paternal Grandfather        dx in his 71s  . Leukemia Paternal Grandfather    . Lung cancer Maternal Uncle        heavy smoker  . Other Paternal Uncle        farm accident  . Stroke Maternal Grandfather     Social History Social History   Tobacco Use  . Smoking status: Never Smoker  . Smokeless tobacco: Former Network engineer Use Topics  . Alcohol use: Yes    Comment: once a year  . Drug use: No     Allergies   Patient has no known allergies.   Review of Systems Review of Systems  Constitutional: Positive for fatigue. Negative for activity change and fever.  Respiratory: Negative for shortness of breath.   Cardiovascular: Negative for chest pain.  Gastrointestinal: Positive for diarrhea and nausea. Negative for abdominal pain and vomiting.  Genitourinary: Negative for dysuria.     Physical Exam Updated Vital Signs BP 105/72 (BP Location: Left Arm)   Pulse 84   Temp 98.1 F (36.7 C) (Oral)   Resp 17   Ht 5\' 6"  (1.676 m)   Wt 72.6 kg (160 lb)   LMP 08/24/2017 (Approximate) Comment: preg test waiver signed  SpO2 100%   BMI 25.82 kg/m   Physical Exam  Constitutional: She is oriented to person, place, and time. She appears well-developed and well-nourished.  HENT:  Head: Normocephalic and atraumatic.  Eyes: Right eye exhibits no discharge. Left eye exhibits no discharge.  Cardiovascular: Normal rate, regular rhythm and normal heart sounds.  No murmur heard. Pulmonary/Chest: Effort normal and breath sounds normal. She has no wheezes. She has no rales.  Port in place  Abdominal: Soft. She exhibits no distension. There is no tenderness.  Neurological: She is oriented to person, place, and time.  Skin: Skin is warm and dry. She is not diaphoretic.  Psychiatric: She has a normal mood and affect.  Nursing note and vitals reviewed.    ED Treatments / Results  Labs (all labs ordered are listed, but only abnormal results are displayed) Labs Reviewed  GASTROINTESTINAL PANEL BY PCR, STOOL (REPLACES STOOL CULTURE)  COMPREHENSIVE  METABOLIC PANEL  CBC WITH DIFFERENTIAL/PLATELET  URINALYSIS, ROUTINE W REFLEX MICROSCOPIC  PREGNANCY, URINE  I-STAT CG4 LACTIC ACID, ED    EKG  EKG Interpretation None       Radiology No results found.  Procedures Procedures (including critical care time)  Medications Ordered in ED Medications  sodium chloride 0.9 % bolus 1,000 mL (not administered)  loperamide (IMODIUM) capsule 2 mg (not administered)     Initial Impression / Assessment and Plan / ED Course  I have reviewed the triage vital signs and the nursing notes.  Pertinent labs & imaging results that were available during my care of the patient were reviewed by me and  considered in my medical decision making (see chart for details).    Patient is a 41 year old female with past medical history significant for breast cancer.  Patient is undergoing chemotherapy.  Patient had recent bout with fever, sepsis several weeks ago.  Patient's been feeling in her normal state of health.  Patient took therapy last on Saturday.  Today was at dinner and all of a sudden felt ill after eating.  Had diarrhea on arrival here.  No fevers.  Otherwise has been feeling fine.  Currently she just feels exhausted.  Because she had recently had sepsis patient and family member were concerned that she was having another infection so came here to be evaluated.  9:38 PM The patient appears very well.  She has normal vital signs and reassuring physical exam.  We will give her fluids.  Will check labs chest x-ray and urine.  However in the absence of fever I do not think is necessary to admit patient currently.  Especially since she is only felt ill for roughly 2 hours.  She had no vomiting.  We will send her stool for culture.  11:41 PM Patient's labs are reassuring.  Mildly low potassium.  Which she had in the past.  We will give her potassium, fluids, have her follow-up with oncology as an outpatient.  Final Clinical Impressions(s) / ED Diagnoses    Final diagnoses:  None    ED Discharge Orders    None       Macarthur Critchley, MD 09/23/17 [phone]

## 2017-09-23 NOTE — ED Triage Notes (Signed)
Pt had chemo last Friday and has been ok since then, tonight after eating dinner she felt very weak

## 2017-09-23 NOTE — ED Provider Notes (Signed)
  Physical Exam  BP (!) 97/54 (BP Location: Left Arm)   Pulse 80   Temp 98.1 F (36.7 C) (Oral)   Resp 14   Ht 5\' 6"  (1.676 m)   Wt 72.6 kg (160 lb)   LMP 08/24/2017 (Approximate) Comment: preg test waiver signed  SpO2 99%   BMI 25.82 kg/m   Physical Exam  ED Course  Procedures  MDM  PT comes in with cc diarrhea. PT has hx of breast CA and she is undergoing chemo. Abd exam is unremarkable. Labs show low K, and pt will get oral mag and oral K.      Sheila Biles, MD 09/23/17 [phone]

## 2017-09-23 NOTE — Discharge Instructions (Signed)
Please take potassium as needed for the next several days.  Please return with any concerns.  If you continue to have diarrhea you may need to go to cancer center in the morning to get more fluids.  Please bring in a stool sample if you are unable to provide one today.

## 2017-09-23 NOTE — ED Notes (Signed)
Unable to collect labs nurse is going to access patient port

## 2017-09-24 ENCOUNTER — Encounter: Payer: PRIVATE HEALTH INSURANCE | Admitting: Physical Therapy

## 2017-09-24 ENCOUNTER — Telehealth: Payer: Self-pay | Admitting: Hematology

## 2017-09-24 DIAGNOSIS — R531 Weakness: Secondary | ICD-10-CM | POA: Diagnosis not present

## 2017-09-24 MED ORDER — HEPARIN SOD (PORK) LOCK FLUSH 100 UNIT/ML IV SOLN
500.0000 [IU] | Freq: Once | INTRAVENOUS | Status: AC
  Administered 2017-09-24: 500 [IU]
  Filled 2017-09-24: qty 5

## 2017-09-24 NOTE — ED Notes (Signed)
Patient asked if she needed to give urine specimen but patient denies needing to urinate.

## 2017-09-24 NOTE — Telephone Encounter (Signed)
Left message for patient regarding upcoming November appointments per 11/2 los

## 2017-09-24 NOTE — ED Notes (Signed)
Patient escorted to the bathroom by staff but did not give urine specimen because patient forgot and staff did not know she needed a urine specimen.

## 2017-09-26 ENCOUNTER — Other Ambulatory Visit: Payer: Self-pay | Admitting: *Deleted

## 2017-09-26 MED ORDER — PROMETHAZINE HCL 12.5 MG PO TABS: 12.5000 mg | ORAL_TABLET | Freq: Four times a day (QID) | ORAL | 2 refills | Status: DC | PRN

## 2017-09-29 ENCOUNTER — Ambulatory Visit: Payer: PRIVATE HEALTH INSURANCE | Admitting: Physical Therapy

## 2017-09-29 ENCOUNTER — Telehealth: Payer: Self-pay | Admitting: Hematology

## 2017-09-29 ENCOUNTER — Encounter: Payer: Self-pay | Admitting: Physical Therapy

## 2017-09-29 DIAGNOSIS — M6281 Muscle weakness (generalized): Secondary | ICD-10-CM

## 2017-09-29 DIAGNOSIS — R293 Abnormal posture: Secondary | ICD-10-CM

## 2017-09-29 DIAGNOSIS — M25612 Stiffness of left shoulder, not elsewhere classified: Secondary | ICD-10-CM

## 2017-09-29 DIAGNOSIS — M25611 Stiffness of right shoulder, not elsewhere classified: Secondary | ICD-10-CM

## 2017-09-29 NOTE — Telephone Encounter (Signed)
09/29/2017 @ 3:53 pm called [phone] spoke with patient to inform her  Disability forms were complete and ready for pick up per cover sheet.  Left forms at front desk reception.

## 2017-09-29 NOTE — Therapy (Signed)
[place], [place], [address] Phone: [phone]   Fax:  [phone]  Physical Therapy Treatment  Patient Details  Name: Sheila [name] MRN: [id number] Date of Birth: 1976/11/18 Referring Provider: Dr. [name]   Encounter Date: 09/29/2017  PT End of Session - 09/29/17 1605    Visit Number  11    Number of Visits  17    Date for PT Re-Evaluation  10/08/17    PT Start Time  4536    PT Stop Time  1515    PT Time Calculation (min)  41 min    Activity Tolerance  Patient tolerated treatment well    Behavior During Therapy  [facility] for tasks assessed/performed       Past Medical History:  Diagnosis Date  . Anxiety   . Cancer [facility])    right breast   . Family history of breast cancer   . Family history of colon cancer   . GERD (gastroesophageal reflux disease)   . Headache    couple of migraines in the past  . History of kidney stones   . Vitamin D deficiency     Past Surgical History:  Procedure Laterality Date  . egg donation    . kidney stone removal      There were no vitals filed for this visit.  Subjective Assessment - 09/29/17 1349    Subjective  Shoulders and exercises are good.     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 It measures 5.5 cm and is located in the upper outer quadrant with a Ki67 of 12%. , Bilateral mastectomies on 07/22/17 with SLNB on R with 2 to 3 additional nodes taken, pt has to complete chemo and radiation and will begin chemo on Aug 29, 2017    Patient Stated Goals  to return to prior level of function, be able to sleep with no discomfort, get ROM back to where it was    Currently in Pain?  No/denies    Pain Score  0-No pain         [facility] PT Assessment - 09/29/17 0001      AROM   Right Shoulder Flexion  135 Degrees    Right Shoulder ABduction  151 Degrees    Left  Shoulder Flexion  156 Degrees    Left Shoulder ABduction  175 Degrees                  [facility] Adult PT Treatment/Exercise - 09/29/17 0001      Shoulder Exercises: Supine   Horizontal ABduction  Strengthening;Both;Theraband;5 reps    Theraband Level (Shoulder Horizontal ABduction)  Level 3 (Green)    External Rotation  Strengthening;Both;Theraband;5 reps    Theraband Level (Shoulder External Rotation)  Level 3 (Green)    Flexion  Strengthening;Both;Theraband;5 reps narrow and wide grip    Theraband Level (Shoulder Flexion)  Level 3 (Green)    Other Supine Exercises  D2 x 10 reps bilaterally with green theraband      Shoulder Exercises: Pulleys   Flexion  2 minutes with stretch at end range    ABduction  2 minutes with stretch at end range      Shoulder Exercises: Therapy Ball   Flexion  10 reps with stretch at end    ABduction  10 reps on left and right      Manual Therapy  Soft tissue mobilization  to right chest area around incision    Passive ROM  within pain limits for right shoulder in flexion, abduction, ER, and IR                   Breast Clinic Goals - 06/18/17 1221      Patient will be able to verbalize understanding of pertinent lymphedema risk reduction practices relevant to her diagnosis specifically related to skin care.   Time  1    Period  Days    Status  Achieved      Patient will be able to return demonstrate and/or verbalize understanding of the post-op home exercise program related to regaining shoulder range of motion.   Time  1    Period  Days    Status  Achieved      Patient will be able to verbalize understanding of the importance of attending the postoperative After Breast Cancer Class for further lymphedema risk reduction education and therapeutic exercise.   Time  1    Period  Days    Status  Achieved       Long Term Clinic Goals - 09/29/17 1358      CC Long Term Goal  #1   Title  Pt to demonstrate 165 degrees of  bilateral shoulder flexion to allow her to reach up and overhead    Baseline  R 98, L 125, 08/25/17- R 122, L 141, 09/10/17- 132 on R, 148 on L, 09/29/17- R 135, L 156    Time  4    Period  Weeks    Status  On-going      CC Long Term Goal  #2   Title  Pt to demonstrate 165 degrees of bilateral shoulder abduction to allow pt to reach out to sides    Baseline  R 74, L 86, 08/25/17- R 92, L 141, 09/10/17- 112 on R, 144 on L, 09/29/17- R 151, 175    Time  4    Period  Weeks    Status  On-going      CC Long Term Goal  #3   Title  Pt to demonstrate 70 degrees of right shoulder internal rotation to allow her to return to prior level of function    Baseline  48, 08/25/17- 51, 09/10/17- 71 degrees    Time  4    Period  Weeks    Status  Achieved      CC Long Term Goal  #4   Title  Pt to report a 75% improvement in right trunk edema to allow improved comfort and sleep    Baseline  08/25/17- 60%, 09/10/17- 70%, 09/29/17- 73% improvement    Time  4    Period  Weeks    Status  On-going      CC Long Term Goal  #5   Title  Pt to be independent in a home exercise program for continued strengthening and stretching    Time  4    Period  Weeks    Status  On-going         Plan - 09/29/17 1403    Clinical Impression Statement  Assessed pt's progress towards goals in therapy. She has progressed towards her goals and her shoulder ROM keeps improving bilaterally though R is still limited. Upgraded supine scapular exercises today to green theraband because pt states she has been using red band at home and it was getting easy. Continued with PROM with focus  on R to further improve R shoulder ROM.     Rehab Potential  Good    Clinical Impairments Affecting Rehab Potential  pt to begin chemo Oct 12 and will have it every 3 weeks     PT Frequency  2x / week    PT Duration  4 weeks    PT Treatment/Interventions  ADLs/Self Care Home Management;Therapeutic activities;Therapeutic exercise;Orthotic  Fit/Training;Patient/family education;Manual techniques;Manual lymph drainage;Scar mobilization;Passive range of motion;Taping    PT Next Visit Plan  continue manual therapy and exercise, myofascial to right axilla, supine scap    PT Home Exercise Plan  Post op shoulder ROM HEP advised pt to not lift arms above 90 degrees until a week after drains removed, supine dowel; supine scapular series    Consulted and Agree with Plan of Care  Patient       Patient will benefit from skilled therapeutic intervention in order to improve the following deficits and impairments:  Postural dysfunction, Decreased knowledge of precautions, Pain, Impaired UE functional use, Decreased range of motion, Decreased strength, Decreased scar mobility, Increased fascial restricitons  Visit Diagnosis: Stiffness of left shoulder, not elsewhere classified  Stiffness of right shoulder, not elsewhere classified  Abnormal posture  Muscle weakness (generalized)     Problem List Patient Active Problem List   Diagnosis Date Noted  . Sepsis ([place]) 09/04/2017  . Neutropenic fever ([place]) 09/04/2017  . Hyponatremia 09/04/2017  . Port-A-Cath in place 08/28/2017  . Genetic testing 06/25/2017  . Family history of breast cancer   . Family history of colon cancer   . Malignant neoplasm of upper-outer quadrant of right breast in female, estrogen receptor positive ([place]) 06/16/2017    [facility] 09/29/2017, 4:06 PM  [place], [place], [address] Phone: [phone]   Fax:  [phone]  Name: Sheila [name] MRN: [id number] Date of Birth: 19-Jan-1976  Manus Gunning, PT 09/29/17 4:06 PM

## 2017-10-01 ENCOUNTER — Encounter: Payer: Self-pay | Admitting: Physical Therapy

## 2017-10-01 ENCOUNTER — Ambulatory Visit: Payer: PRIVATE HEALTH INSURANCE | Admitting: Physical Therapy

## 2017-10-01 DIAGNOSIS — M25612 Stiffness of left shoulder, not elsewhere classified: Secondary | ICD-10-CM | POA: Diagnosis not present

## 2017-10-01 DIAGNOSIS — M25611 Stiffness of right shoulder, not elsewhere classified: Secondary | ICD-10-CM

## 2017-10-01 NOTE — Therapy (Signed)
[place], [place], [address] Phone: [phone]   Fax:  [phone]  Physical Therapy Treatment  Patient Details  Name: Sheila [name] MRN: [id number] Date of Birth: May 11, 1976 Referring Provider: Dr. [name]   Encounter Date: 10/01/2017  PT End of Session - 10/01/17 1618    Visit Number  12    Number of Visits  17    Date for PT Re-Evaluation  10/08/17    PT Start Time  8315 pt arrived late    PT Stop Time  1430    PT Time Calculation (min)  28 min    Activity Tolerance  Patient tolerated treatment well    Behavior During Therapy  [facility] for tasks assessed/performed       Past Medical History:  Diagnosis Date  . Anxiety   . Cancer [facility])    right breast   . Family history of breast cancer   . Family history of colon cancer   . GERD (gastroesophageal reflux disease)   . Headache    couple of migraines in the past  . History of kidney stones   . Vitamin D deficiency     Past Surgical History:  Procedure Laterality Date  . egg donation    . kidney stone removal      There were no vitals filed for this visit.  Subjective Assessment - 10/01/17 1404    Subjective  My shoulders were a little sore after last time. They are sore but they are moving pretty good now.     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 It measures 5.5 cm and is located in the upper outer quadrant with a Ki67 of 12%. , Bilateral mastectomies on 07/22/17 with SLNB on R with 2 to 3 additional nodes taken, pt has to complete chemo and radiation and will begin chemo on Aug 29, 2017    Patient Stated Goals  to return to prior level of function, be able to sleep with no discomfort, get ROM back to where it was    Currently in Pain?  No/denies    Pain Score  0-No pain                      [facility] Adult PT  Treatment/Exercise - 10/01/17 0001      Shoulder Exercises: Pulleys   Flexion  2 minutes with stretch at end range    ABduction  2 minutes with stretch at end range      Shoulder Exercises: Therapy Ball   Flexion  10 reps with stretch at end    ABduction  10 reps on left and right      Manual Therapy   Passive ROM  within pain limits for right shoulder in flexion, abduction, ER, and IR                   Breast Clinic Goals - 06/18/17 1221      Patient will be able to verbalize understanding of pertinent lymphedema risk reduction practices relevant to her diagnosis specifically related to skin care.   Time  1    Period  Days    Status  Achieved      Patient will be able to return demonstrate and/or verbalize understanding of the post-op home exercise program related to regaining shoulder range of motion.   Time  1  Period  Days    Status  Achieved      Patient will be able to verbalize understanding of the importance of attending the postoperative After Breast Cancer Class for further lymphedema risk reduction education and therapeutic exercise.   Time  1    Period  Days    Status  Achieved       Long Term Clinic Goals - 09/29/17 1358      CC Long Term Goal  #1   Title  Pt to demonstrate 165 degrees of bilateral shoulder flexion to allow her to reach up and overhead    Baseline  R 98, L 125, 08/25/17- R 122, L 141, 09/10/17- 132 on R, 148 on L, 09/29/17- R 135, L 156    Time  4    Period  Weeks    Status  On-going      CC Long Term Goal  #2   Title  Pt to demonstrate 165 degrees of bilateral shoulder abduction to allow pt to reach out to sides    Baseline  R 74, L 86, 08/25/17- R 92, L 141, 09/10/17- 112 on R, 144 on L, 09/29/17- R 151, 175    Time  4    Period  Weeks    Status  On-going      CC Long Term Goal  #3   Title  Pt to demonstrate 70 degrees of right shoulder internal rotation to allow her to return to prior level of function    Baseline  48,  08/25/17- 51, 09/10/17- 71 degrees    Time  4    Period  Weeks    Status  Achieved      CC Long Term Goal  #4   Title  Pt to report a 75% improvement in right trunk edema to allow improved comfort and sleep    Baseline  08/25/17- 60%, 09/10/17- 70%, 09/29/17- 73% improvement    Time  4    Period  Weeks    Status  On-going      CC Long Term Goal  #5   Title  Pt to be independent in a home exercise program for continued strengthening and stretching    Time  4    Period  Weeks    Status  On-going         Plan - 10/01/17 1618    Clinical Impression Statement  Pt arrived late to session today. Pt continues to demonstrate improving PROM at by end of session attained near full PROM. Next session with focus more on strengthening exercises since her ROM is progressing.     Rehab Potential  Good    Clinical Impairments Affecting Rehab Potential  pt to begin chemo Oct 12 and will have it every 3 weeks     PT Frequency  2x / week    PT Duration  4 weeks    PT Treatment/Interventions  ADLs/Self Care Home Management;Therapeutic activities;Therapeutic exercise;Orthotic Fit/Training;Patient/family education;Manual techniques;Manual lymph drainage;Scar mobilization;Passive range of motion;Taping    PT Next Visit Plan  continue manual therapy and exercise, myofascial to right axilla, supine scap, add [place] and 3 way shoulder    PT Home Exercise Plan  Post op shoulder ROM HEP advised pt to not lift arms above 90 degrees until a week after drains removed, supine dowel; supine scapular series    Consulted and Agree with Plan of Care  Patient       Patient will benefit from skilled therapeutic intervention  in order to improve the following deficits and impairments:  Postural dysfunction, Decreased knowledge of precautions, Pain, Impaired UE functional use, Decreased range of motion, Decreased strength, Decreased scar mobility, Increased fascial restricitons  Visit Diagnosis: Stiffness of left  shoulder, not elsewhere classified  Stiffness of right shoulder, not elsewhere classified     Problem List Patient Active Problem List   Diagnosis Date Noted  . Sepsis ([place]) 09/04/2017  . Neutropenic fever ([place]) 09/04/2017  . Hyponatremia 09/04/2017  . Port-A-Cath in place 08/28/2017  . Genetic testing 06/25/2017  . Family history of breast cancer   . Family history of colon cancer   . Malignant neoplasm of upper-outer quadrant of right breast in female, estrogen receptor positive ([place]) 06/16/2017    [facility] Sabal [facility] 10/01/2017, 4:21 PM  [place], [place], [address] Phone: [phone]   Fax:  [phone]  Name: Sheila [name] MRN: [id number] Date of Birth: [birth year]  Manus Gunning, PT 10/01/17 4:21 PM

## 2017-10-02 ENCOUNTER — Telehealth: Payer: Self-pay

## 2017-10-02 ENCOUNTER — Encounter: Payer: Self-pay | Admitting: Nurse Practitioner

## 2017-10-02 ENCOUNTER — Ambulatory Visit: Payer: PRIVATE HEALTH INSURANCE

## 2017-10-02 ENCOUNTER — Ambulatory Visit (HOSPITAL_BASED_OUTPATIENT_CLINIC_OR_DEPARTMENT_OTHER): Payer: PRIVATE HEALTH INSURANCE | Admitting: Nurse Practitioner

## 2017-10-02 ENCOUNTER — Ambulatory Visit (HOSPITAL_BASED_OUTPATIENT_CLINIC_OR_DEPARTMENT_OTHER): Payer: PRIVATE HEALTH INSURANCE

## 2017-10-02 ENCOUNTER — Other Ambulatory Visit (HOSPITAL_BASED_OUTPATIENT_CLINIC_OR_DEPARTMENT_OTHER): Payer: PRIVATE HEALTH INSURANCE

## 2017-10-02 VITALS — BP 114/79 | HR 102 | Temp 98.3°F | Resp 18 | Ht 66.0 in | Wt 160.2 lb

## 2017-10-02 DIAGNOSIS — C50411 Malignant neoplasm of upper-outer quadrant of right female breast: Secondary | ICD-10-CM

## 2017-10-02 DIAGNOSIS — E876 Hypokalemia: Secondary | ICD-10-CM

## 2017-10-02 DIAGNOSIS — Z170 Estrogen receptor positive status [ER+]: Principal | ICD-10-CM

## 2017-10-02 DIAGNOSIS — D630 Anemia in neoplastic disease: Secondary | ICD-10-CM | POA: Diagnosis not present

## 2017-10-02 DIAGNOSIS — Z95828 Presence of other vascular implants and grafts: Secondary | ICD-10-CM

## 2017-10-02 LAB — COMPREHENSIVE METABOLIC PANEL WITH GFR
ALT: 19 U/L (ref 0–55)
AST: 16 U/L (ref 5–34)
Albumin: 3.2 g/dL — ABNORMAL LOW (ref 3.5–5.0)
Alkaline Phosphatase: 79 U/L (ref 40–150)
Anion Gap: 9 meq/L (ref 3–11)
BUN: 8.7 mg/dL (ref 7.0–26.0)
CO2: 28 meq/L (ref 22–29)
Calcium: 8.8 mg/dL (ref 8.4–10.4)
Chloride: 106 meq/L (ref 98–109)
Creatinine: 0.9 mg/dL (ref 0.6–1.1)
EGFR: >60 ml/min/1.73 m2
Glucose: 127 mg/dL (ref 70–140)
Potassium: 2.8 meq/L — CL (ref 3.5–5.1)
Sodium: 142 meq/L (ref 136–145)
Total Bilirubin: 0.42 mg/dL (ref 0.20–1.20)
Total Protein: 6.2 g/dL — ABNORMAL LOW (ref 6.4–8.3)

## 2017-10-02 LAB — CBC WITH DIFFERENTIAL/PLATELET
BASO%: 0.3 % (ref 0.0–2.0)
BASOS ABS: 0 10*3/uL (ref 0.0–0.1)
EOS ABS: 0 10*3/uL (ref 0.0–0.5)
EOS%: 0.1 % (ref 0.0–7.0)
HEMATOCRIT: 30 % — AB (ref 34.8–46.6)
HGB: 9.9 g/dL — ABNORMAL LOW (ref 11.6–15.9)
LYMPH%: 23.4 % (ref 14.0–49.7)
MCH: 30 pg (ref 25.1–34.0)
MCHC: 33 g/dL (ref 31.5–36.0)
MCV: 90.9 fL (ref 79.5–101.0)
MONO#: 0.4 10*3/uL (ref 0.1–0.9)
MONO%: 4.7 % (ref 0.0–14.0)
NEUT#: 5.4 10*3/uL (ref 1.5–6.5)
NEUT%: 71.5 % (ref 38.4–76.8)
PLATELETS: 99 10*3/uL — AB (ref 145–400)
RBC: 3.3 10*6/uL — ABNORMAL LOW (ref 3.70–5.45)
RDW: 13.3 % (ref 11.2–14.5)
WBC: 7.5 10*3/uL (ref 3.9–10.3)
lymph#: 1.8 10*3/uL (ref 0.9–3.3)

## 2017-10-02 MED ORDER — SODIUM CHLORIDE 0.9% FLUSH
10.0000 mL | Freq: Once | INTRAVENOUS | Status: AC
  Administered 2017-10-02: 10 mL
  Filled 2017-10-02: qty 10

## 2017-10-02 MED ORDER — HEPARIN SOD (PORK) LOCK FLUSH 100 UNIT/ML IV SOLN
500.0000 [IU] | Freq: Once | INTRAVENOUS | Status: AC
  Administered 2017-10-02: 500 [IU]
  Filled 2017-10-02: qty 5

## 2017-10-02 MED ORDER — SODIUM CHLORIDE 0.9% FLUSH
10.0000 mL | Freq: Once | INTRAVENOUS | Status: AC
  Administered 2017-10-02: 10 mL via INTRAVENOUS
  Filled 2017-10-02: qty 10

## 2017-10-02 MED ORDER — POTASSIUM CHLORIDE ER 10 MEQ PO TBCR: 20.0000 meq | EXTENDED_RELEASE_TABLET | Freq: Every day | ORAL | 1 refills | Status: DC

## 2017-10-02 MED ORDER — HEPARIN SOD (PORK) LOCK FLUSH 100 UNIT/ML IV SOLN
500.0000 [IU] | Freq: Once | INTRAVENOUS | Status: AC
  Administered 2017-10-02: 500 [IU] via INTRAVENOUS
  Filled 2017-10-02: qty 5

## 2017-10-02 MED ORDER — SODIUM CHLORIDE 0.9 % IV SOLN
INTRAVENOUS | Status: DC
  Administered 2017-10-02: 15:00:00 via INTRAVENOUS
  Filled 2017-10-02 (×2): qty 1000

## 2017-10-02 NOTE — Telephone Encounter (Signed)
Printed avs and calender for upcoming appointment. Per 11/15 los 

## 2017-10-02 NOTE — Patient Instructions (Signed)
Dehydration, Adult Dehydration is when there is not enough fluid or water in your body. This happens when you lose more fluids than you take in. Dehydration can range from mild to very bad. It should be treated right away to keep it from getting very bad. Symptoms of mild dehydration may include:  Thirst.  Dry lips.  Slightly dry mouth.  Dry, warm skin.  Dizziness. Symptoms of moderate dehydration may include:  Very dry mouth.  Muscle cramps.  Dark pee (urine). Pee may be the color of tea.  Your body making less pee.  Your eyes making fewer tears.  Heartbeat that is uneven or faster than normal (palpitations).  Headache.  Light-headedness, especially when you stand up from sitting.  Fainting (syncope). Symptoms of very bad dehydration may include:  Changes in skin, such as: ? Cold and clammy skin. ? Blotchy (mottled) or pale skin. ? Skin that does not quickly return to normal after being lightly pinched and let go (poor skin turgor).  Changes in body fluids, such as: ? Feeling very thirsty. ? Your eyes making fewer tears. ? Not sweating when body temperature is high, such as in hot weather. ? Your body making very little pee.  Changes in vital signs, such as: ? Weak pulse. ? Pulse that is more than 100 beats a minute when you are sitting still. ? Fast breathing. ? Low blood pressure.  Other changes, such as: ? Sunken eyes. ? Cold hands and feet. ? Confusion. ? Lack of energy (lethargy). ? Trouble waking up from sleep. ? Short-term weight loss. ? Unconsciousness. Follow these instructions at home:  If told by your doctor, drink an ORS: ? Make an ORS by using instructions on the package. ? Start by drinking small amounts, about  cup (120 mL) every 5-10 minutes. ? Slowly drink more until you have had the amount that your doctor said to have.  Drink enough clear fluid to keep your pee clear or pale yellow. If you were told to drink an ORS, finish the ORS  first, then start slowly drinking clear fluids. Drink fluids such as: ? Water. Do not drink only water by itself. Doing that can make the salt (sodium) level in your body get too low (hyponatremia). ? Ice chips. ? Fruit juice that you have added water to (diluted). ? Low-calorie sports drinks.  Avoid: ? Alcohol. ? Drinks that have a lot of sugar. These include high-calorie sports drinks, fruit juice that does not have water added, and soda. ? Caffeine. ? Foods that are greasy or have a lot of fat or sugar.  Take over-the-counter and prescription medicines only as told by your doctor.  Do not take salt tablets. Doing that can make the salt level in your body get too high (hypernatremia).  Eat foods that have minerals (electrolytes). Examples include bananas, oranges, potatoes, tomatoes, and spinach.  Keep all follow-up visits as told by your doctor. This is important. Contact a doctor if:  You have belly (abdominal) pain that: ? Gets worse. ? Stays in one area (localizes).  You have a rash.  You have a stiff neck.  You get angry or annoyed more easily than normal (irritability).  You are more sleepy than normal.  You have a harder time waking up than normal.  You feel: ? Weak. ? Dizzy. ? Very thirsty.  You have peed (urinated) only a small amount of very dark pee during 6-8 hours. Get help right away if:  You have symptoms of   very bad dehydration.  You cannot drink fluids without throwing up (vomiting).  Your symptoms get worse with treatment.  You have a fever.  You have a very bad headache.  You are throwing up or having watery poop (diarrhea) and it: ? Gets worse. ? Does not go away.  You have blood or something green (bile) in your throw-up.  You have blood in your poop (stool). This may cause poop to look black and tarry.  You have not peed in 6-8 hours.  You pass out (faint).  Your heart rate when you are sitting still is more than 100 beats a  minute.  You have trouble breathing. This information is not intended to replace advice given to you by your health care provider. Make sure you discuss any questions you have with your health care provider. Document Released: 08/31/2009 Document Revised: 05/24/2016 Document Reviewed: 12/29/2015 Elsevier Interactive Patient Education  2018 [employer]  

## 2017-10-02 NOTE — Progress Notes (Signed)
[place]  [phone]) [phone] [phone]) [phone]  Clinic Follow up Note   Patient Care Team: [name], MD as PCP - General (Family Medicine) [name] (Family Medicine) [name], MD as Consulting Physician (General Surgery) [name], MD as Consulting Physician (Hematology) Kyung Rudd, MD as Consulting Physician (Radiation Oncology) 10/02/2017  SUMMARY OF ONCOLOGIC HISTORY: Oncology History    Cancer Staging Malignant neoplasm of upper-outer quadrant of right breast in female, estrogen receptor positive ([place]) Staging form: Breast, AJCC 8th Edition - Clinical stage from 06/11/2017: Stage IB (cT3, cN0, cM0, G2, ER: Positive, PR: Positive, HER2: Positive) - Signed by [name], MD on 06/16/2017 - Pathologic stage from 07/22/2017: Stage IB (pT3(m), pN2a(sn), cM0, G2, ER: Positive, PR: Positive, HER2: Positive) - Signed by [name], NP on 08/04/2017      Malignant neoplasm of upper-outer quadrant of right breast in female, estrogen receptor positive ([place])   06/11/2017 Initial Biopsy    Diagnosis 06/11/17 Breast, right, needle core biopsy, 11:00 o'clock - INVASIVE DUCTAL CARCINOMA, G2      06/11/2017 Mammogram    [name] and MM Diagnostic Breast Tomo Bilateral 06/11/17 IMPRESSION: 1. Highly suspicious mass within the retroareolar right breast, extending from the 8:00 to 11:00 axes, anterior to posterior depth, extending anteriorly to the nipple with associated nipple retraction, measuring at least 5.5 cm by ultrasound  2. No sonographic evidence of metastatic lymphadenopathy in the right axilla. 3. No evidence of malignancy within the left breast.       06/11/2017 Initial Diagnosis    Malignant neoplasm of upper-outer quadrant of right breast in female, estrogen receptor positive ([place])      06/11/2017 Receptors her2    Estrogen Receptor: 100%, POSITIVE, STRONG STAINING INTENSITY Progesterone Receptor: 100%, POSITIVE, STRONG  STAINING INTENSITY Proliferation Marker Ki67: 12%Proliferation Marker Ki67: 12%  HER2 - **POSITIVE** RATIO OF HER2/CEP17 SIGNALS 2.55 AVERAGE HER2 COPY NUMBER PER CELL 4.85      06/25/2017 Genetic Testing    Negative genetic testing on the 9 gene STAT panel.  The STAT Breast cancer panel offered by [facility] includes sequencing and rearrangement analysis for the following 9 genes:  ATM, BRCA1, BRCA2, CDH1, CHEK2, PALB2, PTEN, STK11 and TP53.   The report date is June 25, 2017.  Negative genetic testing on the common hereditary cancer panel.  The Hereditary Gene Panel offered by [facility] includes sequencing and/or deletion duplication testing of the following 46 genes: APC, ATM, AXIN2, BARD1, BMPR1A, BRCA1, BRCA2, BRIP1, CDH1, CDKN2A (p14ARF), CDKN2A (p16INK4a), CHEK2, CTNNA1, DICER1, EPCAM (Deletion/duplication testing only), GREM1 (promoter region deletion/duplication testing only), KIT, MEN1, MLH1, MSH2, MSH3, MSH6, MUTYH, NBN, NF1, NHTL1, PALB2, PDGFRA, PMS2, POLD1, POLE, PTEN, RAD50, RAD51C, RAD51D, SDHB, SDHC, SDHD, SMAD4, SMARCA4. STK11, TP53, TSC1, TSC2, and VHL.  The following genes were evaluated for sequence changes only: SDHA and HOXB13 c.251G>A variant only.  The report date is June 25, 2017.       07/02/2017 Imaging    CT cAP 07/02/17 IMPRESSION: 1. Subareolar right breast mass. No compelling findings of nodal or metastatic involvement. 2. Old granulomatous disease. 3. There is a 7 mm enhancing or hyperdense focus posteriorly in segment 7 of the liver in the subcapsular region. By virtue of its small size this lesion is technically nonspecific although statistically likely to be a small benign lesions such as flash filling hemangioma. This may warrant surveillance. 4.  Prominent stool throughout the colon favors constipation. 5. 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      07/02/2017 Imaging    Bone scan 07/02/17 IMPRESSION: Today' s  exam is negative. I ascribed the tiny focus of activity just proximal to the left antecubital region to injection site.      07/22/2017 Surgery    RIGHT TOTAL MASTECTOMY WITH RIGHT AXILLARY SENTINEL LYMPH NODE BIOPSY, LEFT PROPHYLACTIC MASTECTOM and INSERTION PORT-A-CATH WITH [name] by [name] and [facility]       07/22/2017 Pathology Results    Diagnosis 1. Breast, simple mastectomy, Left - FIBROADENOMA. - NO MALIGNANCY IDENTIFIED. 2. 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 - ADDITIONAL FOCUS OF LOBULAR CARCINOMA, GRADE 2, SPANNING 2.2 CM. - INTERMEDIATE GRADE DUCTAL CARCINOMA IN SITU. - INVASIVE CARCINOMA COMES TO WITHIN 0.2 TO 0.3 CM OF THE DEEP MARGIN, FOCALLY. - TUMOR FOCALLY INVOLVES EPIDERMIS. - LYMPHOVASCULAR INVASION OF DERMAL LYMPHATICS. - ONE OF ONE LYMPH NODES NEGATIVE FOR CARCINOMA (0/1). - SEE ONCOLOGY TABLE. 3. Lymph node, sentinel, biopsy, Right axillary - ONE LYMPH NODE WITH ISOLATED TUMOR CELLS (0/1). 4. Lymph node, sentinel, biopsy, Right - METASTATIC CARCINOMA IN ONE OF ONE LYMPH NODES (1/1). 5. Lymph node, sentinel, biopsy, Right - ONE OF ONE LYMPH NODES NEGATIVE FOR CARCINOMA (0/1). 6. Lymph node, sentinel, biopsy, Right - METASTATIC CARCINOMA IN ONE OF ONE LYMPH NODES (1/1). - EXTRACAPSULAR EXTENSION. 7. Lymph node, sentinel, biopsy, Right - METASTATIC CARCINOMA IN ONE OF ONE LYMPH NODES (1/1). - EXTRACAPSULAR EXTENSION. 8. Lymph node, sentinel, biopsy, Right - ONE OF ONE LYMPH NODES NEGATIVE FOR CARCINOMA (0/1). 9. Lymph node, sentinel, biopsy, Right - ONE OF ONE LYMPH NODES NEGATIVE FOR CARCINOMA (0/1). 10. Lymph node, sentinel, biopsy, Right - METASTATIC CARCINOMA IN ONE OF ONE LYMPH NODES (1/1). - EXTRACAPSULAR EXTENSION. 11. Lymph node, biopsy, Right axillary - ONE OF ONE LYMPH NODES NEGATIVE FOR CARCINOMA (0/1).       07/22/2017 Receptors her2    Ductal carcinoma in primary breast  tumor and node metastasis are ER 95%, PR 95% strongly positive, and HER2 + The lobular component in primary breast tumor is ER 95% positive, PR 95% positive, strong staining, HER-2 negative.       08/25/2017 Imaging    MRI abdomen done at [facility]:  Enhancing structure measing 5 mm is compatible with a flash filling hemangioma. No specific findings to suggest metastatic cancer      08/25/2017 Imaging    MRI Abdomen at [facility] 08/25/17  IMPRESSION: 1. Lesion in segment 7 of the liver has signal and enhancement characteristics compatible with a flash filling hemangioma. No specific findings identified to suggest metastatic disease.      08/29/2017 -  Chemotherapy    adjuvant TCHP every 3 weeks for 6 cycles starting 08/29/17, followed by maintenance Herceptin with or without Perjeta for 6-12 months. Held after cycle 1 due to Sepsis and hospitalization.         09/04/2017 - 09/09/2017 Hospital Admission    Admit date: 09/04/2017 Discharge date: 09/09/2017  DISCHARGE DIAGNOSES:  Principal Problem:   Sepsis [facility]) Active Problems:   Malignant neoplasm of upper-outer quadrant of right breast in female, estrogen receptor positive ([place])   Port-A-Cath in place   Neutropenic fever ([place])   Hyponatremia   Malignant neoplasm of breast in female, estrogen receptor positive ([place])      CURRENT THERAPY: adjuvant chemotherapy TCHP every 3 weeks for 6 cycles beginning 08/29/2017. Decrease Carbo with cycle 2 on 09/19/17 due to neutropenic  fever. Neulasta from cycle 2 due to febrile neutropenia   INTERVAL HISTORY: Ms. Volkert returns today for ED follow-up following cycle 2 [place] on 09/19/2017. She tolerated cycle 2 with Neulasta well overall, she had mild bone pain controlled with Claritin times 5 days.  On day 4 after eating out for dinner she became suddenly weak, fatigued, warm sensation with lightheadedness/dizziness.  Diarrhea upon arrival in the ED.  Did and given oral magnesium and potassium and  discharged home in stable condition. Diarrhea improved, now with occasional loose stool. Remained afebrile, denies cough, chest pain, dyspnea, n/v/c/d, or mouth sores. Appetite and energy level are improving. Has noticed intermittent transient tingling to toes, no numbness, no functional impairment.   REVIEW OF SYSTEMS:   Constitutional: Denies fevers, chills or abnormal weight loss (+) moderate fatigue, improving Eyes: Denies blurriness of vision Ears, nose, mouth, throat, and face: Denies mucositis or sore throat Respiratory: Denies cough, dyspnea or wheezes Cardiovascular: Denies palpitation, chest discomfort or lower extremity swelling Gastrointestinal:  Denies nausea, vomiting, constipation, diarrhea, heartburn or change in bowel habits (+) occ loose stool  Skin: Denies abnormal skin rashes Lymphatics: Denies new lymphadenopathy or easy bruising Neurological:Denies numbness or new weaknesses (+) intermittent transient tingling to toes  Behavioral/Psych: Mood is stable, no new changes  Breasts: Status post bilateral mastectomy All other systems were reviewed with the patient and are negative.  MEDICAL HISTORY:  Past Medical History:  Diagnosis Date  . Anxiety   . Cancer [facility])    right breast   . Family history of breast cancer   . Family history of colon cancer   . GERD (gastroesophageal reflux disease)   . Headache    couple of migraines in the past  . History of kidney stones   . Vitamin D deficiency     SURGICAL HISTORY: Past Surgical History:  Procedure Laterality Date  . egg donation    . kidney stone removal    . MASTECTOMY W/ SENTINEL NODE BIOPSY Bilateral 07/22/2017   Procedure: RIGHT TOTAL MASTECTOMY WITH RIGHT AXILLARY SENTINEL LYMPH NODE BIOPSY, LEFT PROPHYLACTIC MASTECTOMY;  Surgeon: [name], MD;  Location: [place];  Service: General;  Laterality: Bilateral;  . PORTACATH PLACEMENT Right 07/22/2017   Procedure: INSERTION PORT-A-CATH WITH [name];  Surgeon:  [name], MD;  Location: [place];  Service: General;  Laterality: Right;    I have reviewed the social history and family history with the patient and they are unchanged from previous note.  ALLERGIES:  has No Known Allergies.  MEDICATIONS:  Current Outpatient Medications  Medication Sig Dispense Refill  . calcium carbonate (TUMS - DOSED IN MG ELEMENTAL CALCIUM) 500 MG chewable tablet Chew 2 tablets by mouth as needed for indigestion or heartburn.     . Cholecalciferol (VITAMIN D) 2000 units tablet Take 2,000 Units by mouth daily.    Marland Kitchen dexamethasone (DECADRON) 4 MG tablet Take 2 tablets (8 mg total) by mouth 2 (two) times daily. Start the day before Taxotere. Then again the day after chemo for 3 days. 30 tablet 1  . diphenhydramine-acetaminophen (TYLENOL PM) 25-500 MG TABS tablet Take 2 tablets by mouth at bedtime. sleep    . fluticasone (FLONASE) 50 MCG/ACT nasal spray Place 1 spray into both nostrils daily.    Marland Kitchen lidocaine-prilocaine (EMLA) cream Apply to affected area once 30 g 3  . loperamide (IMODIUM) 2 MG capsule Take 1 capsule (2 mg total) by mouth 3 (three) times daily as needed for diarrhea or loose stools.  30 capsule 0  . naproxen sodium (ANAPROX) 220 MG tablet Take 220 mg 2 (two) times daily as needed by mouth (pain).     Marland Kitchen omeprazole (PRILOSEC) 20 MG capsule Take 20 mg by mouth at bedtime.     . promethazine (PHENERGAN) 12.5 MG tablet Take 1 tablet (12.5 mg total) every 6 (six) hours as needed by mouth for nausea or vomiting. 30 tablet 2  . diphenoxylate-atropine (LOMOTIL) 2.5-0.025 MG tablet Take 1-2 tablets by mouth 4 (four) times daily as needed for diarrhea or loose stools. (Patient not taking: Reported on 10/02/2017) 30 tablet 2  . potassium chloride (K-DUR) 10 MEQ tablet Take 2 tablets (20 mEq total) daily by mouth. Take 2 tabs twice daily for 5 days then take 2 tablets daily 60 tablet 1  . prochlorperazine (COMPAZINE) 10 MG tablet Take 1 tablet (10 mg total) by mouth  every 6 (six) hours as needed (Nausea or vomiting). (Patient not taking: Reported on 10/02/2017) 30 tablet 1   Current Facility-Administered Medications  Medication Dose Route Frequency Provider Last Rate Last Dose  . gi cocktail (Maalox,Lidocaine,Donnatal)  30 mL Oral Once Harle Stanford., PA-C      . sodium chloride 0.9 % 1,000 mL with potassium chloride 20 mEq infusion   Intravenous Continuous [name], NP 500 mL/hr at 10/02/17 1501     Facility-Administered Medications Ordered in Other Visits  Medication Dose Route Frequency Provider Last Rate Last Dose  . heparin lock flush 100 unit/mL  500 Units Intravenous Once [name], MD      . sodium chloride flush (NS) 0.9 % injection 10 mL  10 mL Intravenous Once [name], MD        PHYSICAL EXAMINATION: ECOG PERFORMANCE STATUS: 2 - Symptomatic, <50% confined to bed  Vitals:   10/02/17 1310  BP: 114/79  Pulse: (!) 102  Resp: 18  Temp: 98.3 F (36.8 C)  SpO2: 100%   Filed Weights   10/02/17 1310  Weight: 160 lb 3.2 oz (72.7 kg)    GENERAL:alert, no distress and comfortable SKIN: skin color, texture, turgor are normal, no rashes or significant lesions EYES: normal, Conjunctiva are pink and non-injected, sclera clear OROPHARYNX:no exudate, no erythema and lips, buccal mucosa, and tongue normal  NECK: supple, thyroid normal size, non-tender, without nodularity LYMPH:  no palpable cervical or supraclavicular lymphadenopathy  LUNGS: clear to auscultation bilaterally with normal breathing effort HEART: regular rate & rhythm and no murmurs and no lower extremity edema ABDOMEN:abdomen soft, non-tender and normal bowel sounds Musculoskeletal:no cyanosis of digits and no clubbing  NEURO: alert & oriented x 3 with fluent speech, no focal motor/sensory deficits PAC without erythema  LABORATORY DATA:  I have reviewed the data as listed CBC Latest Ref Rng & Units 10/02/2017 09/23/2017 09/19/2017  WBC 3.9 - 10.3 10e3/uL 7.5 5.8 6.3    Hemoglobin 11.6 - 15.9 g/dL 9.9(L) 10.7(L) 9.5(L)  Hematocrit 34.8 - 46.6 % 30.0(L) 31.7(L) 28.5(L)  Platelets 145 - 400 10e3/uL 99(L) 234 222     CMP Latest Ref Rng & Units 10/02/2017 09/23/2017 09/19/2017  Glucose 70 - 140 mg/dl 127 143(H) 179(H)  BUN 7.0 - 26.0 mg/dL 8.7 24(H) 10.6  Creatinine 0.6 - 1.1 mg/dL 0.9 1.06(H) 0.9  Sodium 136 - 145 mEq/L 142 135 139  Potassium 3.5 - 5.1 mEq/L 2.8(LL) 2.9(L) 3.5  Chloride 101 - 111 mmol/L - 100(L) -  CO2 22 - 29 mEq/L 28 23 21(L)  Calcium 8.4 - 10.4 mg/dL 8.8  8.7(L) 9.0  Total Protein 6.4 - 8.3 g/dL 6.2(L) 7.4 6.5  Total Bilirubin 0.20 - 1.20 mg/dL 0.42 0.9 0.40  Alkaline Phos 40 - 150 U/L 79 66 58  AST 5 - 34 U/L '16 18 22  ' ALT 0 - 55 U/L 19 23 47    RADIOGRAPHIC STUDIES: I have personally reviewed the radiological images as listed and agreed with the findings in the report. No results found.   ASSESSMENT & PLAN: Cienna Dumais is a 41 y.o. premenopausal female with a history of Vitamin D deficiency and high cholesterol and kidney stones, presented with a palpable right breast mass.   1. Malignant neoplasm of upper-outer quadrant of right breast in female, mixed invasive ductal and lobular carcinoma, pT3(m)N2aM0, stage 1b, ductal carcinoma tripple positive, lobular carcinoma ER+/PR+/HER2-,  Grade 2 2. Genetics 3. Hyperglycemia 4. Social work, Insurance underwriter 5. Febrile neutropenia 6. Nausea, diarrhea 7. 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 Bittinger appears stable today. Her appetite, fatigue, and GI side effects are improving. She is able to eat and drink adequately; weight is stable and VSS. HR is mildly elevated to 102. Mildly anemic, Hgb 9.9, and thrombocytopenic, plt 99, secondary to chemotherapy; she does not require transfusion at this time. She is hypokalemic today, K 2.9. She was on short course oral K after hospitalization, 40 mg daily x5 days. She will get 20 mEq in 1 L NS today over 2 hours, will start 40 mEq PO  x5 days then reduce to 20 mEq daily until next visit. She tolerated neulasta well, will continue due to history of neutropenic fever and hospitalization after cycle 1. She gets copay assistance at [place] for [employer]. She will return in 1 week for cycle 3 TCHP then 1 weeks later for lab, f/u with APP and IVF.   PLAN: K replacement with 20 mEq in 1 L NS IV over 2 hours; begin 40 mEq daily x5 days then reduce to 20 mEq daily on 10/03/17; refilled oral K prescription Return in 1 week for lab, flush, APP f/u and cycle 3 TCHP Return in 2 weeks for f/u and IVF  All questions were answered. The patient knows to call the clinic with any problems, questions or concerns. No barriers to learning was detected.    [name], NP 10/02/17

## 2017-10-06 ENCOUNTER — Encounter: Payer: Self-pay | Admitting: Physical Therapy

## 2017-10-06 ENCOUNTER — Ambulatory Visit: Payer: PRIVATE HEALTH INSURANCE | Admitting: Physical Therapy

## 2017-10-06 ENCOUNTER — Telehealth: Payer: Self-pay

## 2017-10-06 DIAGNOSIS — M25612 Stiffness of left shoulder, not elsewhere classified: Secondary | ICD-10-CM

## 2017-10-06 DIAGNOSIS — R293 Abnormal posture: Secondary | ICD-10-CM

## 2017-10-06 DIAGNOSIS — M6281 Muscle weakness (generalized): Secondary | ICD-10-CM

## 2017-10-06 DIAGNOSIS — M25611 Stiffness of right shoulder, not elsewhere classified: Secondary | ICD-10-CM

## 2017-10-06 NOTE — Therapy (Signed)
[place], [place], [address] Phone: [phone]   Fax:  [phone]  Physical Therapy Treatment  Patient Details  Name: Sheila [name] MRN: [id number] Date of Birth: Dec 03, 1975 Referring Provider: Dr. [name]   Encounter Date: 10/06/2017  PT End of Session - 10/06/17 1657    Visit Number  13    Number of Visits  17    Date for PT Re-Evaluation  10/08/17    PT Start Time  1350    PT Stop Time  1432    PT Time Calculation (min)  42 min    Activity Tolerance  Patient tolerated treatment well    Behavior During Therapy  [facility] for tasks assessed/performed       Past Medical History:  Diagnosis Date  . Anxiety   . Cancer [facility])    right breast   . Family history of breast cancer   . Family history of colon cancer   . GERD (gastroesophageal reflux disease)   . Headache    couple of migraines in the past  . History of kidney stones   . Vitamin D deficiency     Past Surgical History:  Procedure Laterality Date  . egg donation    . INSERTION PORT-A-CATH WITH [name] Right 07/22/2017   Performed by [name], MD at [facility] OR  . kidney stone removal    . RIGHT TOTAL MASTECTOMY WITH RIGHT AXILLARY SENTINEL LYMPH NODE BIOPSY, LEFT PROPHYLACTIC MASTECTOMY Bilateral 07/22/2017   Performed by [name], MD at [facility] OR    There were no vitals filed for this visit.  Subjective Assessment - 10/06/17 1351    Subjective  I was a little sore after last time. I feel like my shoulder is improving.     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 It measures 5.5 cm and is located in the upper outer quadrant with a Ki67 of 12%. , Bilateral mastectomies on 07/22/17 with SLNB on R with 2 to 3 additional nodes taken, pt has to complete chemo and radiation and will begin chemo on Aug 29, 2017    Patient Stated Goals  to  return to prior level of function, be able to sleep with no discomfort, get ROM back to where it was    Currently in Pain?  No/denies    Pain Score  0-No pain                      [facility] Adult PT Treatment/Exercise - 10/06/17 0001      Shoulder Exercises: Standing   External Rotation  Strengthening;Both;10 reps;Theraband    Theraband Level (Shoulder External Rotation)  Level 1 (Yellow)    Internal Rotation  Strengthening;Both;10 reps;Theraband    Theraband Level (Shoulder Internal Rotation)  Level 1 (Yellow)    Flexion  Strengthening;Both;10 reps;Theraband    Theraband Level (Shoulder Flexion)  Level 1 (Yellow)    Extension  Strengthening;Both;10 reps;Theraband    Theraband Level (Shoulder Extension)  Level 1 (Yellow)    Other Standing Exercises  3 way shoulder raises with 1 lb bilaterally x 10 each      Shoulder Exercises: Pulleys   Flexion  2 minutes with stretch at end range    ABduction  2 minutes with stretch at end range      Shoulder Exercises: Therapy Ball   Flexion  10 reps with stretch at  end    ABduction  10 reps on left and right      Manual Therapy   Soft tissue mobilization  to right chest area around incision    Passive ROM  within pain limits for right shoulder in flexion, abduction, ER, and IR                   Breast Clinic Goals - 06/18/17 1221      Patient will be able to verbalize understanding of pertinent lymphedema risk reduction practices relevant to her diagnosis specifically related to skin care.   Time  1    Period  Days    Status  Achieved      Patient will be able to return demonstrate and/or verbalize understanding of the post-op home exercise program related to regaining shoulder range of motion.   Time  1    Period  Days    Status  Achieved      Patient will be able to verbalize understanding of the importance of attending the postoperative After Breast Cancer Class for further lymphedema risk reduction education  and therapeutic exercise.   Time  1    Period  Days    Status  Achieved       Long Term Clinic Goals - 09/29/17 1358      CC Long Term Goal  #1   Title  Pt to demonstrate 165 degrees of bilateral shoulder flexion to allow her to reach up and overhead    Baseline  R 98, L 125, 08/25/17- R 122, L 141, 09/10/17- 132 on R, 148 on L, 09/29/17- R 135, L 156    Time  4    Period  Weeks    Status  On-going      CC Long Term Goal  #2   Title  Pt to demonstrate 165 degrees of bilateral shoulder abduction to allow pt to reach out to sides    Baseline  R 74, L 86, 08/25/17- R 92, L 141, 09/10/17- 112 on R, 144 on L, 09/29/17- R 151, 175    Time  4    Period  Weeks    Status  On-going      CC Long Term Goal  #3   Title  Pt to demonstrate 70 degrees of right shoulder internal rotation to allow her to return to prior level of function    Baseline  48, 08/25/17- 51, 09/10/17- 71 degrees    Time  4    Period  Weeks    Status  Achieved      CC Long Term Goal  #4   Title  Pt to report a 75% improvement in right trunk edema to allow improved comfort and sleep    Baseline  08/25/17- 60%, 09/10/17- 70%, 09/29/17- 73% improvement    Time  4    Period  Weeks    Status  On-going      CC Long Term Goal  #5   Title  Pt to be independent in a home exercise program for continued strengthening and stretching    Time  4    Period  Weeks    Status  On-going         Plan - 10/06/17 1658    Clinical Impression Statement  Instructed pt in new strengthening exercises today with the theraband and dumbells. Pt felt the exercises were challenging but she was able to complete them with correct form. Did soft tissue mobilization to  area of tightness around mastectomy scar as well as PROM>     Rehab Potential  Good    Clinical Impairments Affecting Rehab Potential  pt to begin chemo Oct 12 and will have it every 3 weeks     PT Frequency  2x / week    PT Duration  4 weeks    PT Treatment/Interventions   ADLs/Self Care Home Management;Therapeutic activities;Therapeutic exercise;Orthotic Fit/Training;Patient/family education;Manual techniques;Manual lymph drainage;Scar mobilization;Passive range of motion;Taping    PT Next Visit Plan  continue manual therapy and exercise, myofascial to right axilla, supine scap, assess indep with [place] and 3 way shoulder    PT Home Exercise Plan  Post op shoulder ROM HEP advised pt to not lift arms above 90 degrees until a week after drains removed, supine dowel; supine scapular series    Recommended Other Services  [place], supine scap, 3 way shoulder    Consulted and Agree with Plan of Care  Patient       Patient will benefit from skilled therapeutic intervention in order to improve the following deficits and impairments:  Postural dysfunction, Decreased knowledge of precautions, Pain, Impaired UE functional use, Decreased range of motion, Decreased strength, Decreased scar mobility, Increased fascial restricitons  Visit Diagnosis: Stiffness of left shoulder, not elsewhere classified  Stiffness of right shoulder, not elsewhere classified  Abnormal posture  Muscle weakness (generalized)     Problem List Patient Active Problem List   Diagnosis Date Noted  . Sepsis ([place]) 09/04/2017  . Neutropenic fever ([place]) 09/04/2017  . Hyponatremia 09/04/2017  . Port-A-Cath in place 08/28/2017  . Genetic testing 06/25/2017  . Family history of breast cancer   . Family history of colon cancer   . Malignant neoplasm of upper-outer quadrant of right breast in female, estrogen receptor positive ([place]) 06/16/2017    Allyson Sabal [facility] 10/06/2017, 5:00 PM  [place], [place], [address] Phone: [phone]   Fax:  [phone]  Name: Sheila [name] MRN: [id number] Date of Birth: 1976-03-24  Manus Gunning, PT 10/06/17 5:00 PM

## 2017-10-06 NOTE — Patient Instructions (Signed)
Strengthening: Resisted Flexion    Cancer Rehab [phone]    Hold tubing with left arm at side. Pull forward and up. Move shoulder through pain-free range of motion. Repeat _5-10___ times per set. Do _1-2___ sessions per day.  Strengthening: Resisted Internal Rotation    Hold tubing in left hand, elbow at side and forearm out. Rotate forearm in across body. Repeat _5-10___ times per set. Do _1-2___ sessions per day.  Strengthening: Resisted Extension    Hold tubing in left hand, arm forward. Pull arm back, elbow straight. Repeat __5-10__ times per set. Do __1-2__ sessions per day.   Strengthening: Resisted External Rotation    Hold tubing in left hand, elbow at side and forearm across body. Rotate forearm out. Repeat _5-10___ times per set. Do __1-2__ sessions per day.    Repeat on opposite side.   3 Way Raises:      Starting Position:  Leaning against wall, walk feet a few inches away from the wall and make tummy tight (tuck hips underneath you) Press back/shoulders/head against wall as much as possible. Keep thumbs up to ceiling, elbows straight and shoulders relaxed/down throughout.  1. Lift arms in front to shoulder height 2. Lift arms a little wider into a "V" to shoulder height 3. Lift arms out to sides in a "T" to shoulder height  Perform 10 times in each direction. Hold 1-2 lbs to start with and work up to 2-3 sets of 10/day. Perform 3-4 times/week. Increase weight as able, decreasing sets of 10 each time you increase weights, then slowly working your way back up to 2-3 sets each time.    Cancer Rehab [phone]

## 2017-10-06 NOTE — Telephone Encounter (Signed)
Spoke with patient concerning upcoming appointment add on. Per 11/15 los

## 2017-10-08 ENCOUNTER — Ambulatory Visit: Payer: PRIVATE HEALTH INSURANCE | Admitting: Physical Therapy

## 2017-10-08 ENCOUNTER — Encounter: Payer: Self-pay | Admitting: Physical Therapy

## 2017-10-08 DIAGNOSIS — M6281 Muscle weakness (generalized): Secondary | ICD-10-CM

## 2017-10-08 DIAGNOSIS — R293 Abnormal posture: Secondary | ICD-10-CM

## 2017-10-08 DIAGNOSIS — M25612 Stiffness of left shoulder, not elsewhere classified: Secondary | ICD-10-CM | POA: Diagnosis not present

## 2017-10-08 DIAGNOSIS — M25611 Stiffness of right shoulder, not elsewhere classified: Secondary | ICD-10-CM

## 2017-10-08 NOTE — Therapy (Signed)
[place], [place], [address] Phone: [phone]   Fax:  [phone]  Physical Therapy Treatment  Patient Details  Name: Kellsey Sansone MRN: [id number] Date of Birth: 03/10/1976 Referring Provider: Dr. [name]   Encounter Date: 10/08/2017  PT End of Session - 10/08/17 0944    Visit Number  14    Number of Visits  17    Date for PT Re-Evaluation  10/08/17    PT Start Time  0846    PT Stop Time  0931    PT Time Calculation (min)  45 min    Activity Tolerance  Patient tolerated treatment well    Behavior During Therapy  [facility] for tasks assessed/performed       Past Medical History:  Diagnosis Date  . Anxiety   . Cancer [facility])    right breast   . Family history of breast cancer   . Family history of colon cancer   . GERD (gastroesophageal reflux disease)   . Headache    couple of migraines in the past  . History of kidney stones   . Vitamin D deficiency     Past Surgical History:  Procedure Laterality Date  . egg donation    . kidney stone removal    . MASTECTOMY W/ SENTINEL NODE BIOPSY Bilateral 07/22/2017   Procedure: RIGHT TOTAL MASTECTOMY WITH RIGHT AXILLARY SENTINEL LYMPH NODE BIOPSY, LEFT PROPHYLACTIC MASTECTOMY;  Surgeon: [name], MD;  Location: [place];  Service: General;  Laterality: Bilateral;  . PORTACATH PLACEMENT Right 07/22/2017   Procedure: INSERTION PORT-A-CATH WITH [name];  Surgeon: [name], MD;  Location: [place];  Service: General;  Laterality: Right;    There were no vitals filed for this visit.  Subjective Assessment - 10/08/17 0847    Subjective  I didn't do my exercises yesterday but I was not sore after last time.     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 It measures 5.5 cm and is located in the upper outer quadrant with a Ki67 of 12%. , Bilateral  mastectomies on 07/22/17 with SLNB on R with 2 to 3 additional nodes taken, pt has to complete chemo and radiation and will begin chemo on Aug 29, 2017    Patient Stated Goals  to return to prior level of function, be able to sleep with no discomfort, get ROM back to where it was    Currently in Pain?  No/denies    Pain Score  0-No pain                      [facility] Adult PT Treatment/Exercise - 10/08/17 0001      Shoulder Exercises: Standing   External Rotation  Strengthening;Both;10 reps;Theraband    Theraband Level (Shoulder External Rotation)  Level 2 (Red)    Internal Rotation  Strengthening;Both;10 reps;Theraband    Theraband Level (Shoulder Internal Rotation)  Level 2 (Red)    Flexion  Strengthening;Both;10 reps;Theraband    Theraband Level (Shoulder Flexion)  Level 2 (Red)    Extension  Strengthening;Both;10 reps;Theraband    Theraband Level (Shoulder Extension)  Level 2 (Red)    Other Standing Exercises  3 way shoulder raises with 1 lb bilaterally x 10 each    Other Standing Exercises  Instructed pt in Stength ABC packet through squats, held all stretches for 30 sec and did all exercises x 10  reps with 1 lb weights      Shoulder Exercises: Pulleys   Flexion  2 minutes with stretch at end range    ABduction  2 minutes with stretch at end range      Shoulder Exercises: Therapy Ball   Flexion  10 reps with stretch at end                   Breast Clinic Goals - 06/18/17 1221      Patient will be able to verbalize understanding of pertinent lymphedema risk reduction practices relevant to her diagnosis specifically related to skin care.   Time  1    Period  Days    Status  Achieved      Patient will be able to return demonstrate and/or verbalize understanding of the post-op home exercise program related to regaining shoulder range of motion.   Time  1    Period  Days    Status  Achieved      Patient will be able to verbalize understanding of the  importance of attending the postoperative After Breast Cancer Class for further lymphedema risk reduction education and therapeutic exercise.   Time  1    Period  Days    Status  Achieved       Long Term Clinic Goals - 10/08/17 0848      CC Long Term Goal  #1   Title  Pt to demonstrate 165 degrees of bilateral shoulder flexion to allow her to reach up and overhead    Baseline  R 98, L 125, 08/25/17- R 122, L 141, 09/10/17- 132 on R, 148 on L, 09/29/17- R 135, L 156, 10/08/17- R 144, L 169    Time  4    Period  Weeks    Status  Partially Met      CC Long Term Goal  #2   Title  Pt to demonstrate 165 degrees of bilateral shoulder abduction to allow pt to reach out to sides    Baseline  R 74, L 86, 08/25/17- R 92, L 141, 09/10/17- 112 on R, 144 on L, 09/29/17- R 151, 175, 10/08/17- R 175 L 178    Time  4    Period  Weeks    Status  Achieved      CC Long Term Goal  #3   Title  Pt to demonstrate 70 degrees of right shoulder internal rotation to allow her to return to prior level of function    Baseline  48, 08/25/17- 51, 09/10/17- 71 degrees    Time  4    Period  Weeks    Status  Achieved      CC Long Term Goal  #4   Title  Pt to report a 75% improvement in right trunk edema to allow improved comfort and sleep    Baseline  08/25/17- 60%, 09/10/17- 70%, 09/29/17- 73% improvement, 10/08/17- 73%    Time  4    Period  Weeks    Status  On-going      CC Long Term Goal  #5   Title  Pt to be independent in a home exercise program for continued strengthening and stretching    Time  4    Period  Weeks    Status  On-going         Plan - 10/08/17 0944    Clinical Impression Statement  Increased resistance on [place] exercises and continued instructing pt in strengthening exercises issued last  session. Pt has now met her abduction ROM goal and has partially met her flexion goal. Began instructing pt in Strength ABC program.     Rehab Potential  Good    Clinical Impairments Affecting Rehab  Potential  pt to begin chemo Oct 12 and will have it every 3 weeks     PT Frequency  2x / week    PT Duration  4 weeks    PT Treatment/Interventions  ADLs/Self Care Home Management;Therapeutic activities;Therapeutic exercise;Orthotic Fit/Training;Patient/family education;Manual techniques;Manual lymph drainage;Scar mobilization;Passive range of motion;Taping    PT Next Visit Plan  recert 2x/wk for 4 weeks, continue instruction in strength ABC packet after squats, PROM to R shoulder flexion    PT Home Exercise Plan  Post op shoulder ROM HEP advised pt to not lift arms above 90 degrees until a week after drains removed, supine dowel; supine scapular series    Consulted and Agree with Plan of Care  Patient       Patient will benefit from skilled therapeutic intervention in order to improve the following deficits and impairments:  Postural dysfunction, Decreased knowledge of precautions, Pain, Impaired UE functional use, Decreased range of motion, Decreased strength, Decreased scar mobility, Increased fascial restricitons  Visit Diagnosis: Stiffness of left shoulder, not elsewhere classified  Stiffness of right shoulder, not elsewhere classified  Abnormal posture  Muscle weakness (generalized)     Problem List Patient Active Problem List   Diagnosis Date Noted  . Sepsis ([place]) 09/04/2017  . Neutropenic fever ([place]) 09/04/2017  . Hyponatremia 09/04/2017  . Port-A-Cath in place 08/28/2017  . Genetic testing 06/25/2017  . Family history of breast cancer   . Family history of colon cancer   . Malignant neoplasm of upper-outer quadrant of right breast in female, estrogen receptor positive ([place]) 06/16/2017    Allyson Sabal [facility] 10/08/2017, 9:46 AM  [place], [place], [address] Phone: [phone]   Fax:  [phone]  Name: Laelah Siravo MRN: [id number] Date of Birth: September 16, 1976  Manus Gunning, PT 10/08/17 9:47 AM

## 2017-10-10 ENCOUNTER — Other Ambulatory Visit: Payer: Self-pay | Admitting: Oncology

## 2017-10-10 ENCOUNTER — Encounter: Payer: Self-pay | Admitting: Nurse Practitioner

## 2017-10-10 ENCOUNTER — Other Ambulatory Visit (HOSPITAL_BASED_OUTPATIENT_CLINIC_OR_DEPARTMENT_OTHER): Payer: PRIVATE HEALTH INSURANCE

## 2017-10-10 ENCOUNTER — Ambulatory Visit: Payer: PRIVATE HEALTH INSURANCE

## 2017-10-10 ENCOUNTER — Ambulatory Visit (HOSPITAL_BASED_OUTPATIENT_CLINIC_OR_DEPARTMENT_OTHER): Payer: PRIVATE HEALTH INSURANCE | Admitting: Nurse Practitioner

## 2017-10-10 ENCOUNTER — Ambulatory Visit (HOSPITAL_BASED_OUTPATIENT_CLINIC_OR_DEPARTMENT_OTHER): Payer: PRIVATE HEALTH INSURANCE

## 2017-10-10 VITALS — HR 102

## 2017-10-10 VITALS — BP 105/69 | HR 113 | Temp 98.6°F | Resp 18 | Ht 66.0 in | Wt 160.8 lb

## 2017-10-10 DIAGNOSIS — C50411 Malignant neoplasm of upper-outer quadrant of right female breast: Secondary | ICD-10-CM

## 2017-10-10 DIAGNOSIS — D696 Thrombocytopenia, unspecified: Secondary | ICD-10-CM | POA: Diagnosis not present

## 2017-10-10 DIAGNOSIS — Z5111 Encounter for antineoplastic chemotherapy: Secondary | ICD-10-CM | POA: Diagnosis not present

## 2017-10-10 DIAGNOSIS — Z170 Estrogen receptor positive status [ER+]: Principal | ICD-10-CM

## 2017-10-10 DIAGNOSIS — Z5189 Encounter for other specified aftercare: Secondary | ICD-10-CM | POA: Diagnosis not present

## 2017-10-10 DIAGNOSIS — D630 Anemia in neoplastic disease: Secondary | ICD-10-CM | POA: Diagnosis not present

## 2017-10-10 DIAGNOSIS — Z5112 Encounter for antineoplastic immunotherapy: Secondary | ICD-10-CM

## 2017-10-10 DIAGNOSIS — G4700 Insomnia, unspecified: Secondary | ICD-10-CM | POA: Diagnosis not present

## 2017-10-10 DIAGNOSIS — Z95828 Presence of other vascular implants and grafts: Secondary | ICD-10-CM

## 2017-10-10 LAB — COMPREHENSIVE METABOLIC PANEL
ALBUMIN: 3.6 g/dL (ref 3.5–5.0)
ALK PHOS: 72 U/L (ref 40–150)
ALT: 88 U/L — ABNORMAL HIGH (ref 0–55)
ANION GAP: 12 meq/L — AB (ref 3–11)
AST: 39 U/L — ABNORMAL HIGH (ref 5–34)
BILIRUBIN TOTAL: 0.48 mg/dL (ref 0.20–1.20)
BUN: 13 mg/dL (ref 7.0–26.0)
CALCIUM: 9.7 mg/dL (ref 8.4–10.4)
CO2: 21 mEq/L — ABNORMAL LOW (ref 22–29)
Chloride: 106 mEq/L (ref 98–109)
Creatinine: 1 mg/dL (ref 0.6–1.1)
Glucose: 147 mg/dl — ABNORMAL HIGH (ref 70–140)
Potassium: 4.5 mEq/L (ref 3.5–5.1)
Sodium: 139 mEq/L (ref 136–145)
TOTAL PROTEIN: 6.9 g/dL (ref 6.4–8.3)

## 2017-10-10 LAB — CBC WITH DIFFERENTIAL/PLATELET
BASO%: 0.2 % (ref 0.0–2.0)
Basophils Absolute: 0 10*3/uL (ref 0.0–0.1)
EOS ABS: 0 10*3/uL (ref 0.0–0.5)
EOS%: 0 % (ref 0.0–7.0)
HCT: 30 % — ABNORMAL LOW (ref 34.8–46.6)
HEMOGLOBIN: 9.9 g/dL — AB (ref 11.6–15.9)
LYMPH%: 8.9 % — AB (ref 14.0–49.7)
MCH: 30.5 pg (ref 25.1–34.0)
MCHC: 33 g/dL (ref 31.5–36.0)
MCV: 92.4 fL (ref 79.5–101.0)
MONO#: 0.3 10*3/uL (ref 0.1–0.9)
MONO%: 3.6 % (ref 0.0–14.0)
NEUT%: 87.3 % — ABNORMAL HIGH (ref 38.4–76.8)
NEUTROS ABS: 7.7 10*3/uL — AB (ref 1.5–6.5)
PLATELETS: 116 10*3/uL — AB (ref 145–400)
RBC: 3.25 10*6/uL — AB (ref 3.70–5.45)
RDW: 14.6 % — ABNORMAL HIGH (ref 11.2–14.5)
WBC: 8.8 10*3/uL (ref 3.9–10.3)
lymph#: 0.8 10*3/uL — ABNORMAL LOW (ref 0.9–3.3)

## 2017-10-10 MED ORDER — DIPHENHYDRAMINE HCL 25 MG PO CAPS
ORAL_CAPSULE | ORAL | Status: AC
  Filled 2017-10-10: qty 2

## 2017-10-10 MED ORDER — PALONOSETRON HCL INJECTION 0.25 MG/5ML
0.2500 mg | Freq: Once | INTRAVENOUS | Status: AC
  Administered 2017-10-10: 0.25 mg via INTRAVENOUS

## 2017-10-10 MED ORDER — PEGFILGRASTIM 6 MG/0.6ML ~~LOC~~ PSKT
6.0000 mg | PREFILLED_SYRINGE | Freq: Once | SUBCUTANEOUS | Status: AC
  Administered 2017-10-10: 6 mg via SUBCUTANEOUS
  Filled 2017-10-10: qty 0.6

## 2017-10-10 MED ORDER — TRASTUZUMAB CHEMO 150 MG IV SOLR
450.0000 mg | Freq: Once | INTRAVENOUS | Status: AC
  Administered 2017-10-10: 450 mg via INTRAVENOUS
  Filled 2017-10-10: qty 21.43

## 2017-10-10 MED ORDER — SODIUM CHLORIDE 0.9 % IV SOLN
420.0000 mg | Freq: Once | INTRAVENOUS | Status: AC
  Administered 2017-10-10: 420 mg via INTRAVENOUS
  Filled 2017-10-10: qty 14

## 2017-10-10 MED ORDER — PALONOSETRON HCL INJECTION 0.25 MG/5ML
INTRAVENOUS | Status: AC
  Filled 2017-10-10: qty 5

## 2017-10-10 MED ORDER — SODIUM CHLORIDE 0.9 % IV SOLN
Freq: Once | INTRAVENOUS | Status: AC
  Administered 2017-10-10: 12:00:00 via INTRAVENOUS

## 2017-10-10 MED ORDER — SODIUM CHLORIDE 0.9% FLUSH
10.0000 mL | Freq: Once | INTRAVENOUS | Status: AC
  Administered 2017-10-10: 10 mL
  Filled 2017-10-10: qty 10

## 2017-10-10 MED ORDER — MIRTAZAPINE 15 MG PO TABS: 15.0000 mg | ORAL_TABLET | Freq: Every day | ORAL | 0 refills | Status: DC

## 2017-10-10 MED ORDER — DEXAMETHASONE SODIUM PHOSPHATE 10 MG/ML IJ SOLN
INTRAMUSCULAR | Status: AC
  Filled 2017-10-10: qty 1

## 2017-10-10 MED ORDER — ACETAMINOPHEN 325 MG PO TABS
650.0000 mg | ORAL_TABLET | Freq: Once | ORAL | Status: AC
  Administered 2017-10-10: 650 mg via ORAL

## 2017-10-10 MED ORDER — DIPHENHYDRAMINE HCL 25 MG PO CAPS
50.0000 mg | ORAL_CAPSULE | Freq: Once | ORAL | Status: AC
  Administered 2017-10-10: 50 mg via ORAL

## 2017-10-10 MED ORDER — ACETAMINOPHEN 325 MG PO TABS
ORAL_TABLET | ORAL | Status: AC
  Filled 2017-10-10: qty 2

## 2017-10-10 MED ORDER — DOCETAXEL CHEMO INJECTION 160 MG/16ML
75.0000 mg/m2 | Freq: Once | INTRAVENOUS | Status: AC
  Administered 2017-10-10: 140 mg via INTRAVENOUS
  Filled 2017-10-10: qty 14

## 2017-10-10 MED ORDER — DEXAMETHASONE SODIUM PHOSPHATE 10 MG/ML IJ SOLN
10.0000 mg | Freq: Once | INTRAMUSCULAR | Status: AC
  Administered 2017-10-10: 10 mg via INTRAVENOUS

## 2017-10-10 MED ORDER — HEPARIN SOD (PORK) LOCK FLUSH 100 UNIT/ML IV SOLN
500.0000 [IU] | Freq: Once | INTRAVENOUS | Status: AC | PRN
  Administered 2017-10-10: 500 [IU]
  Filled 2017-10-10: qty 5

## 2017-10-10 MED ORDER — SODIUM CHLORIDE 0.9 % IV SOLN
503.5500 mg | Freq: Once | INTRAVENOUS | Status: AC
  Administered 2017-10-10: 500 mg via INTRAVENOUS
  Filled 2017-10-10: qty 50

## 2017-10-10 MED ORDER — SODIUM CHLORIDE 0.9% FLUSH
10.0000 mL | INTRAVENOUS | Status: DC | PRN
  Administered 2017-10-10: 10 mL
  Filled 2017-10-10: qty 10

## 2017-10-10 NOTE — Patient Instructions (Signed)

## 2017-10-10 NOTE — Patient Instructions (Signed)
[facility] Discharge Instructions for Patients Receiving Chemotherapy  Today you received the following chemotherapy agents: Herceptin, Perjeta, Taxotere, Carboplatin.  To help prevent nausea and vomiting after your treatment, we encourage you to take your nausea medication as prescribed. If you develop nausea and vomiting that is not controlled by your nausea medication, call the clinic.   BELOW ARE SYMPTOMS THAT SHOULD BE REPORTED IMMEDIATELY:  *FEVER GREATER THAN 100.5 F  *CHILLS WITH OR WITHOUT FEVER  NAUSEA AND VOMITING THAT IS NOT CONTROLLED WITH YOUR NAUSEA MEDICATION  *UNUSUAL SHORTNESS OF BREATH  *UNUSUAL BRUISING OR BLEEDING  TENDERNESS IN MOUTH AND THROAT WITH OR WITHOUT PRESENCE OF ULCERS  *URINARY PROBLEMS  *BOWEL PROBLEMS  UNUSUAL RASH Items with * indicate a potential emergency and should be followed up as soon as possible.  Feel free to call the clinic should you have any questions or concerns. The clinic phone number is [phone].  Please show the CHEMO ALERT CARD at check-in to the Emergency Department and triage nurse.   

## 2017-10-10 NOTE — Progress Notes (Signed)
[place]  [phone]) [phone] [phone]) [phone]  Clinic Follow up Note   Patient Care Team: [name], MD as PCP - General (Family Medicine) [name] (Family Medicine) [name], MD as Consulting Physician (General Surgery) [name], MD as Consulting Physician (Hematology) Kyung Rudd, MD as Consulting Physician (Radiation Oncology) 10/10/2017  CHIEF COMPLAINTS F/U breast cancer  SUMMARY OF ONCOLOGIC HISTORY: Oncology History    Cancer Staging Malignant neoplasm of upper-outer quadrant of right breast in female, estrogen receptor positive ([place]) Staging form: Breast, AJCC 8th Edition - Clinical stage from 06/11/2017: Stage IB (cT3, cN0, cM0, G2, ER: Positive, PR: Positive, HER2: Positive) - Signed by [name], MD on 06/16/2017 - Pathologic stage from 07/22/2017: Stage IB (pT3(m), pN2a(sn), cM0, G2, ER: Positive, PR: Positive, HER2: Positive) - Signed by [name], NP on 08/04/2017      Malignant neoplasm of upper-outer quadrant of right breast in female, estrogen receptor positive ([place])   06/11/2017 Initial Biopsy    Diagnosis 06/11/17 Breast, right, needle core biopsy, 11:00 o'clock - INVASIVE DUCTAL CARCINOMA, G2      06/11/2017 Mammogram    [name] and MM Diagnostic Breast Tomo Bilateral 06/11/17 IMPRESSION: 1. Highly suspicious mass within the retroareolar right breast, extending from the 8:00 to 11:00 axes, anterior to posterior depth, extending anteriorly to the nipple with associated nipple retraction, measuring at least 5.5 cm by ultrasound  2. No sonographic evidence of metastatic lymphadenopathy in the right axilla. 3. No evidence of malignancy within the left breast.       06/11/2017 Initial Diagnosis    Malignant neoplasm of upper-outer quadrant of right breast in female, estrogen receptor positive ([place])      06/11/2017 Receptors her2    Estrogen Receptor: 100%, POSITIVE, STRONG STAINING INTENSITY Progesterone  Receptor: 100%, POSITIVE, STRONG STAINING INTENSITY Proliferation Marker Ki67: 12%Proliferation Marker Ki67: 12%  HER2 - **POSITIVE** RATIO OF HER2/CEP17 SIGNALS 2.55 AVERAGE HER2 COPY NUMBER PER CELL 4.85      06/25/2017 Genetic Testing    Negative genetic testing on the 9 gene STAT panel.  The STAT Breast cancer panel offered by [facility] includes sequencing and rearrangement analysis for the following 9 genes:  ATM, BRCA1, BRCA2, CDH1, CHEK2, PALB2, PTEN, STK11 and TP53.   The report date is June 25, 2017.  Negative genetic testing on the common hereditary cancer panel.  The Hereditary Gene Panel offered by [facility] includes sequencing and/or deletion duplication testing of the following 46 genes: APC, ATM, AXIN2, BARD1, BMPR1A, BRCA1, BRCA2, BRIP1, CDH1, CDKN2A (p14ARF), CDKN2A (p16INK4a), CHEK2, CTNNA1, DICER1, EPCAM (Deletion/duplication testing only), GREM1 (promoter region deletion/duplication testing only), KIT, MEN1, MLH1, MSH2, MSH3, MSH6, MUTYH, NBN, NF1, NHTL1, PALB2, PDGFRA, PMS2, POLD1, POLE, PTEN, RAD50, RAD51C, RAD51D, SDHB, SDHC, SDHD, SMAD4, SMARCA4. STK11, TP53, TSC1, TSC2, and VHL.  The following genes were evaluated for sequence changes only: SDHA and HOXB13 c.251G>A variant only.  The report date is June 25, 2017.       07/02/2017 Imaging    CT cAP 07/02/17 IMPRESSION: 1. Subareolar right breast mass. No compelling findings of nodal or metastatic involvement. 2. Old granulomatous disease. 3. There is a 7 mm enhancing or hyperdense focus posteriorly in segment 7 of the liver in the subcapsular region. By virtue of its small size this lesion is technically nonspecific although statistically likely to be a small benign lesions such as flash filling hemangioma. This may warrant surveillance. 4.  Prominent stool throughout the colon favors constipation. 5. Degenerative disc  disease and spondylosis at L5-S1 likely causing mild impingement.      07/02/2017 Imaging    Bone  scan 07/02/17 IMPRESSION: Today' s exam is negative. I ascribed the tiny focus of activity just proximal to the left antecubital region to injection site.      07/22/2017 Surgery    RIGHT TOTAL MASTECTOMY WITH RIGHT AXILLARY SENTINEL LYMPH NODE BIOPSY, LEFT PROPHYLACTIC MASTECTOM and INSERTION PORT-A-CATH WITH [name] by [name] and [facility]       07/22/2017 Pathology Results    Diagnosis 1. Breast, simple mastectomy, Left - FIBROADENOMA. - NO MALIGNANCY IDENTIFIED. 2. 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 - ADDITIONAL FOCUS OF LOBULAR CARCINOMA, GRADE 2, SPANNING 2.2 CM. - INTERMEDIATE GRADE DUCTAL CARCINOMA IN SITU. - INVASIVE CARCINOMA COMES TO WITHIN 0.2 TO 0.3 CM OF THE DEEP MARGIN, FOCALLY. - TUMOR FOCALLY INVOLVES EPIDERMIS. - LYMPHOVASCULAR INVASION OF DERMAL LYMPHATICS. - ONE OF ONE LYMPH NODES NEGATIVE FOR CARCINOMA (0/1). - SEE ONCOLOGY TABLE. 3. Lymph node, sentinel, biopsy, Right axillary - ONE LYMPH NODE WITH ISOLATED TUMOR CELLS (0/1). 4. Lymph node, sentinel, biopsy, Right - METASTATIC CARCINOMA IN ONE OF ONE LYMPH NODES (1/1). 5. Lymph node, sentinel, biopsy, Right - ONE OF ONE LYMPH NODES NEGATIVE FOR CARCINOMA (0/1). 6. Lymph node, sentinel, biopsy, Right - METASTATIC CARCINOMA IN ONE OF ONE LYMPH NODES (1/1). - EXTRACAPSULAR EXTENSION. 7. Lymph node, sentinel, biopsy, Right - METASTATIC CARCINOMA IN ONE OF ONE LYMPH NODES (1/1). - EXTRACAPSULAR EXTENSION. 8. Lymph node, sentinel, biopsy, Right - ONE OF ONE LYMPH NODES NEGATIVE FOR CARCINOMA (0/1). 9. Lymph node, sentinel, biopsy, Right - ONE OF ONE LYMPH NODES NEGATIVE FOR CARCINOMA (0/1). 10. Lymph node, sentinel, biopsy, Right - METASTATIC CARCINOMA IN ONE OF ONE LYMPH NODES (1/1). - EXTRACAPSULAR EXTENSION. 11. Lymph node, biopsy, Right axillary - ONE OF ONE LYMPH NODES NEGATIVE FOR CARCINOMA (0/1).       07/22/2017 Receptors her2     Ductal carcinoma in primary breast tumor and node metastasis are ER 95%, PR 95% strongly positive, and HER2 + The lobular component in primary breast tumor is ER 95% positive, PR 95% positive, strong staining, HER-2 negative.       08/25/2017 Imaging    MRI abdomen done at [facility]:  Enhancing structure measing 5 mm is compatible with a flash filling hemangioma. No specific findings to suggest metastatic cancer      08/25/2017 Imaging    MRI Abdomen at [facility] 08/25/17  IMPRESSION: 1. Lesion in segment 7 of the liver has signal and enhancement characteristics compatible with a flash filling hemangioma. No specific findings identified to suggest metastatic disease.      08/29/2017 -  Chemotherapy    adjuvant TCHP every 3 weeks for 6 cycles starting 08/29/17, followed by maintenance Herceptin with or without Perjeta for 6-12 months. Held after cycle 1 due to Sepsis and hospitalization.         09/04/2017 - 09/09/2017 Hospital Admission    Admit date: 09/04/2017 Discharge date: 09/09/2017  DISCHARGE DIAGNOSES:  Principal Problem:   Sepsis [facility]) Active Problems:   Malignant neoplasm of upper-outer quadrant of right breast in female, estrogen receptor positive ([place])   Port-A-Cath in place   Neutropenic fever ([place])   Hyponatremia   Malignant neoplasm of breast in female, estrogen receptor positive ([place])      CURRENT THERAPY:adjuvant chemotherapy TCHP every 3 weeks for 6 cycles beginning 08/29/2017.Decrease Carbo with cycle 2 on  09/19/17 due to neutropenic fever. Neulasta from cycle 2 due to febrile neutropenia   INTERVAL HISTORY: Ms. liss returns for f/u as scheduled prior to cycle 3 TCHP. She is having trouble sleeping lately due to alternating feeling hot/col, nocturia related to increased liquid intake, and mild anxiety about upcoming treatment. She has tried tylenol PM, trazadone, lunesta, and xanax in the past without much improvement. Mild infrequent nausea well controlled  with phenergan PRN; zofran does not help. She has occasional diarrhea and loose stool at baseline; well controlled with 2 tablets imodium per day. Bone pain after chemo is mild, does not require claritin or other medication. She feels her right breast incision is tight and mildly swollen, she has 2-3 more weeks of PT.    REVIEW OF SYSTEMS:   Constitutional: Denies fevers, chills or abnormal weight loss (+) mild fatigue (+) fair appetite  Eyes: Denies blurriness of vision Ears, nose, mouth, throat, and face: Denies mucositis or sore throat Respiratory: Denies cough, dyspnea or wheezes Cardiovascular: Denies palpitation, chest discomfort or lower extremity swelling Gastrointestinal:  Denies constipation, emesis, change in bowel habits (+) mild nausea, rarely uses phenergan (+) occasional diarrhea and loose stool; controlled with 2 imodium per day Skin: Denies abnormal skin rashes (+) right breast incision feels tight GU: (+) frequent urination at night due to increased daytime liquid intake; denies urgency or dysuria Lymphatics: Denies new lymphadenopathy or easy bruising Neurological:Denies numbness, tingling or new weaknesses Behavioral/Psych: Mood is stable, no new changes (+) difficulty sleeping (+) mild anxiety regarding upcoming treatment and side effects All other systems were reviewed with the patient and are negative.  MEDICAL HISTORY:  Past Medical History:  Diagnosis Date  . Anxiety   . Cancer [facility])    right breast   . Family history of breast cancer   . Family history of colon cancer   . GERD (gastroesophageal reflux disease)   . Headache    couple of migraines in the past  . History of kidney stones   . Vitamin D deficiency     SURGICAL HISTORY: Past Surgical History:  Procedure Laterality Date  . egg donation    . kidney stone removal    . MASTECTOMY W/ SENTINEL NODE BIOPSY Bilateral 07/22/2017   Procedure: RIGHT TOTAL MASTECTOMY WITH RIGHT AXILLARY SENTINEL LYMPH NODE  BIOPSY, LEFT PROPHYLACTIC MASTECTOMY;  Surgeon: [name], MD;  Location: [place];  Service: General;  Laterality: Bilateral;  . PORTACATH PLACEMENT Right 07/22/2017   Procedure: INSERTION PORT-A-CATH WITH [name];  Surgeon: [name], MD;  Location: [place];  Service: General;  Laterality: Right;    I have reviewed the social history and family history with the patient and they are unchanged from previous note.  ALLERGIES:  has No Known Allergies.  MEDICATIONS:  Current Outpatient Medications  Medication Sig Dispense Refill  . Cholecalciferol (VITAMIN D) 2000 units tablet Take 2,000 Units by mouth daily.    Marland Kitchen dexamethasone (DECADRON) 4 MG tablet Take 2 tablets (8 mg total) by mouth 2 (two) times daily. Start the day before Taxotere. Then again the day after chemo for 3 days. 30 tablet 1  . fluticasone (FLONASE) 50 MCG/ACT nasal spray Place 1 spray into both nostrils daily.    Marland Kitchen lidocaine-prilocaine (EMLA) cream Apply to affected area once 30 g 3  . loperamide (IMODIUM) 2 MG capsule Take 1 capsule (2 mg total) by mouth 3 (three) times daily as needed for diarrhea or loose stools. 30 capsule 0  . naproxen sodium (  ANAPROX) 220 MG tablet Take 220 mg 2 (two) times daily as needed by mouth (pain).     . potassium chloride (K-DUR) 10 MEQ tablet Take 2 tablets (20 mEq total) daily by mouth. Take 2 tabs twice daily for 5 days then take 2 tablets daily 60 tablet 1  . promethazine (PHENERGAN) 12.5 MG tablet Take 1 tablet (12.5 mg total) every 6 (six) hours as needed by mouth for nausea or vomiting. 30 tablet 2  . calcium carbonate (TUMS - DOSED IN MG ELEMENTAL CALCIUM) 500 MG chewable tablet Chew 2 tablets by mouth as needed for indigestion or heartburn.     . diphenhydramine-acetaminophen (TYLENOL PM) 25-500 MG TABS tablet Take 2 tablets by mouth at bedtime. sleep    . diphenoxylate-atropine (LOMOTIL) 2.5-0.025 MG tablet Take 1-2 tablets by mouth 4 (four) times daily as needed for diarrhea or  loose stools. (Patient not taking: Reported on 10/02/2017) 30 tablet 2  . mirtazapine (REMERON) 15 MG tablet Take 1 tablet (15 mg total) by mouth at bedtime. 30 tablet 0  . omeprazole (PRILOSEC) 20 MG capsule Take 20 mg by mouth at bedtime.     . prochlorperazine (COMPAZINE) 10 MG tablet Take 1 tablet (10 mg total) by mouth every 6 (six) hours as needed (Nausea or vomiting). (Patient not taking: Reported on 10/10/2017) 30 tablet 1   Current Facility-Administered Medications  Medication Dose Route Frequency Provider Last Rate Last Dose  . gi cocktail (Maalox,Lidocaine,Donnatal)  30 mL Oral Once Harle Stanford., PA-C       Facility-Administered Medications Ordered in Other Visits  Medication Dose Route Frequency Provider Last Rate Last Dose  . CARBOplatin (PARAPLATIN) 500 mg in sodium chloride 0.9 % 250 mL chemo infusion  500 mg Intravenous Once Ladell Pier, MD      . DOCEtaxel (TAXOTERE) 140 mg in sodium chloride 0.9 % 250 mL chemo infusion  75 mg/m2 (Treatment Plan Recorded) Intravenous Once Ladell Pier, MD      . heparin lock flush 100 unit/mL  500 Units Intracatheter Once PRN [name], MD      . pegfilgrastim (NEULASTA ONPRO KIT) injection 6 mg  6 mg Subcutaneous Once [name], NP      . pertuzumab (PERJETA) 420 mg in sodium chloride 0.9 % 250 mL chemo infusion  420 mg Intravenous Once [name], MD      . sodium chloride flush (NS) 0.9 % injection 10 mL  10 mL Intracatheter PRN [name], MD      . trastuzumab (HERCEPTIN) 450 mg in sodium chloride 0.9 % 250 mL chemo infusion  450 mg Intravenous Once [name], MD 542.9 mL/hr at 10/10/17 1224 450 mg at 10/10/17 1224    PHYSICAL EXAMINATION: ECOG PERFORMANCE STATUS: 1 - Symptomatic but completely ambulatory  Vitals:   10/10/17 1034  BP: 105/69  Pulse: (!) 113  Resp: 18  Temp: 98.6 F (37 C)  SpO2: 100%   Filed Weights   10/10/17 1034  Weight: 160 lb 12.8 oz (72.9 kg)    GENERAL:alert, no distress and  comfortable SKIN: skin color, texture, turgor are normal, no rashes or significant lesions EYES: normal, Conjunctiva are pink and non-injected, sclera clear OROPHARYNX:no exudate, no erythema and lips, buccal mucosa, and tongue normal  NECK: supple, thyroid normal size, non-tender, without nodularity LYMPH:  no palpable cervical, supraclavicular, or axillary  lymphadenopathy  LUNGS: clear to auscultation bilaterally with normal breathing effort HEART: tachycardic; regular rhythm and no murmurs and no  lower extremity edema ABDOMEN:abdomen soft, non-tender and normal bowel sounds. No palpable hepatomegaly Musculoskeletal:no cyanosis of digits and no clubbing  NEURO: alert & oriented x 3 with fluent speech, no focal motor/sensory deficits BREASTS: s/p bilateral mastectomy, incisions are well healed PAC wtihout erythema  LABORATORY DATA:  I have reviewed the data as listed CBC Latest Ref Rng & Units 10/10/2017 10/02/2017 09/23/2017  WBC 3.9 - 10.3 10e3/uL 8.8 7.5 5.8  Hemoglobin 11.6 - 15.9 g/dL 9.9(L) 9.9(L) 10.7(L)  Hematocrit 34.8 - 46.6 % 30.0(L) 30.0(L) 31.7(L)  Platelets 145 - 400 10e3/uL 116(L) 99(L) 234     CMP Latest Ref Rng & Units 10/10/2017 10/02/2017 09/23/2017  Glucose 70 - 140 mg/dl 147(H) 127 143(H)  BUN 7.0 - 26.0 mg/dL 13.0 8.7 24(H)  Creatinine 0.6 - 1.1 mg/dL 1.0 0.9 1.06(H)  Sodium 136 - 145 mEq/L 139 142 135  Potassium 3.5 - 5.1 mEq/L 4.5 2.8(LL) 2.9(L)  Chloride 101 - 111 mmol/L - - 100(L)  CO2 22 - 29 mEq/L 21(L) 28 23  Calcium 8.4 - 10.4 mg/dL 9.7 8.8 8.7(L)  Total Protein 6.4 - 8.3 g/dL 6.9 6.2(L) 7.4  Total Bilirubin 0.20 - 1.20 mg/dL 0.48 0.42 0.9  Alkaline Phos 40 - 150 U/L 72 79 66  AST 5 - 34 U/L 39(H) 16 18  ALT 0 - 55 U/L 88(H) 19 23     RADIOGRAPHIC STUDIES: I have personally reviewed the radiological images as listed and agreed with the findings in the report. No results found.   ASSESSMENT & PLAN: Sheila [name] a 41 y.o.premenopausal  female with a history of Vitamin D deficiency and high cholesterol and kidney stones, presented with a palpable right breast mass.   1. Malignant neoplasm of upper-outer quadrant of right breast in female, mixed invasive ductal and lobular carcinoma, pT3(m)N2aM0, stage 1b, ductal carcinoma tripple positive, lobular carcinoma ER+/PR+/HER2-, Grade 2 2. Genetics 3. Hyperglycemia 4. Social work, Insurance underwriter 5. Febrile neutropenia 6. Nausea, diarrhea 7. 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 Thrombocytopenia 10. Elevated transaminases 11. Insomnia    Sheila [name] appears stable today. Chemotherapy side effects are well managed. She has history of insomnia failed multiple agents such as lunesta, trazadone, tylenol pm, and xanax. I prescribed remeron for her to try. I explained this may also help with her anxiety related to treatment and side effects. Weight stable. Tachycardic on exam, regular rhythm;  May be related to anxiety. VS otherwise stable. CBC with mild anemia, Hgb 9.9, and thrombocytopenia, plt 119k.  Does not require transfusions at this time. LFTs mildly elevated, AST 39, ALT 88; likely secondary to chemotherapy. Will monitor closely. Nonfasting BG 147, mildly elevated from decadron premeds. Hypokalemia resolved, K 4.5. Due to intermittent diarrhea and loose stool she will continue oral K 20 mEq daily. Physical exam unremarkable. She will proceed with cycle 3 TCHP with onpro today. Carboplatin will be further reduced due to thrombocytopenia. She will return in 1 week for toxicity check and IVF. I discussed this case with Dr. Benay Spice who recommended and signed chemo dose reduction.   PLAN: Remeron 15 mg qHS for insomnia, Prescription sent today Labs reviewed, OK to proceed with cycle 3 TCHP with Onpro, carboplatin further dose reduced for thrombocytopenia Return in 1 week for f/u with NP and IVF   All questions were answered. The patient knows to call the clinic with any problems,  questions or concerns. No barriers to learning was detected.     [name], NP  10/10/17    

## 2017-10-17 ENCOUNTER — Ambulatory Visit (HOSPITAL_BASED_OUTPATIENT_CLINIC_OR_DEPARTMENT_OTHER): Payer: PRIVATE HEALTH INSURANCE

## 2017-10-17 ENCOUNTER — Other Ambulatory Visit (HOSPITAL_BASED_OUTPATIENT_CLINIC_OR_DEPARTMENT_OTHER): Payer: PRIVATE HEALTH INSURANCE

## 2017-10-17 ENCOUNTER — Encounter: Payer: Self-pay | Admitting: Nurse Practitioner

## 2017-10-17 ENCOUNTER — Ambulatory Visit (HOSPITAL_BASED_OUTPATIENT_CLINIC_OR_DEPARTMENT_OTHER): Payer: PRIVATE HEALTH INSURANCE | Admitting: Nurse Practitioner

## 2017-10-17 VITALS — BP 110/76 | HR 134 | Temp 99.2°F | Resp 20 | Wt 162.8 lb

## 2017-10-17 VITALS — BP 109/73 | HR 124 | Temp 97.1°F | Resp 18

## 2017-10-17 DIAGNOSIS — D696 Thrombocytopenia, unspecified: Secondary | ICD-10-CM

## 2017-10-17 DIAGNOSIS — R000 Tachycardia, unspecified: Secondary | ICD-10-CM | POA: Diagnosis not present

## 2017-10-17 DIAGNOSIS — Z95828 Presence of other vascular implants and grafts: Secondary | ICD-10-CM

## 2017-10-17 DIAGNOSIS — C50411 Malignant neoplasm of upper-outer quadrant of right female breast: Secondary | ICD-10-CM | POA: Diagnosis not present

## 2017-10-17 DIAGNOSIS — Z170 Estrogen receptor positive status [ER+]: Secondary | ICD-10-CM

## 2017-10-17 LAB — CBC WITH DIFFERENTIAL/PLATELET
BASO%: 1 % (ref 0.0–2.0)
BASOS ABS: 0.1 10*3/uL (ref 0.0–0.1)
EOS%: 0.2 % (ref 0.0–7.0)
Eosinophils Absolute: 0 10*3/uL (ref 0.0–0.5)
HEMATOCRIT: 31.2 % — AB (ref 34.8–46.6)
HGB: 10.3 g/dL — ABNORMAL LOW (ref 11.6–15.9)
LYMPH#: 2.1 10*3/uL (ref 0.9–3.3)
LYMPH%: 34.2 % (ref 14.0–49.7)
MCH: 30.6 pg (ref 25.1–34.0)
MCHC: 33 g/dL (ref 31.5–36.0)
MCV: 92.6 fL (ref 79.5–101.0)
MONO#: 1.5 10*3/uL — ABNORMAL HIGH (ref 0.1–0.9)
MONO%: 23.8 % — AB (ref 0.0–14.0)
NEUT#: 2.5 10*3/uL (ref 1.5–6.5)
NEUT%: 40.8 % (ref 38.4–76.8)
Platelets: 140 10*3/uL — ABNORMAL LOW (ref 145–400)
RBC: 3.37 10*6/uL — AB (ref 3.70–5.45)
RDW: 14.8 % — ABNORMAL HIGH (ref 11.2–14.5)
WBC: 6.2 10*3/uL (ref 3.9–10.3)

## 2017-10-17 LAB — COMPREHENSIVE METABOLIC PANEL
ALBUMIN: 3.6 g/dL (ref 3.5–5.0)
ALK PHOS: 78 U/L (ref 40–150)
ALT: 22 U/L (ref 0–55)
ANION GAP: 9 meq/L (ref 3–11)
AST: 14 U/L (ref 5–34)
BILIRUBIN TOTAL: 0.42 mg/dL (ref 0.20–1.20)
BUN: 9.6 mg/dL (ref 7.0–26.0)
CO2: 27 meq/L (ref 22–29)
CREATININE: 0.9 mg/dL (ref 0.6–1.1)
Calcium: 8.7 mg/dL (ref 8.4–10.4)
Chloride: 101 mEq/L (ref 98–109)
GLUCOSE: 126 mg/dL (ref 70–140)
Potassium: 3.5 mEq/L (ref 3.5–5.1)
SODIUM: 137 meq/L (ref 136–145)
TOTAL PROTEIN: 6.6 g/dL (ref 6.4–8.3)

## 2017-10-17 MED ORDER — SODIUM CHLORIDE 0.9% FLUSH
10.0000 mL | Freq: Once | INTRAVENOUS | Status: AC
  Administered 2017-10-17: 10 mL
  Filled 2017-10-17: qty 10

## 2017-10-17 MED ORDER — SODIUM CHLORIDE 0.9 % IV SOLN
Freq: Once | INTRAVENOUS | Status: AC
  Administered 2017-10-17: 11:00:00 via INTRAVENOUS

## 2017-10-17 MED ORDER — HEPARIN SOD (PORK) LOCK FLUSH 100 UNIT/ML IV SOLN
500.0000 [IU] | Freq: Once | INTRAVENOUS | Status: AC
  Administered 2017-10-17: 500 [IU] via INTRAVENOUS
  Filled 2017-10-17: qty 5

## 2017-10-17 MED ORDER — SODIUM CHLORIDE 0.9% FLUSH
10.0000 mL | INTRAVENOUS | Status: DC | PRN
  Administered 2017-10-17: 10 mL via INTRAVENOUS
  Filled 2017-10-17: qty 10

## 2017-10-17 NOTE — Progress Notes (Signed)
[place]  [phone] [phone]  Clinic Follow up Note   Patient Care Team: [name], MD as PCP - General (Family Medicine) [name] (Family Medicine) [name], MD as Consulting Physician (General Surgery) [name], MD as Consulting Physician (Hematology) Kyung Rudd, MD as Consulting Physician (Radiation Oncology) 10/17/2017  CHIEF COMPLAINT: F/u breast cancer   SUMMARY OF ONCOLOGIC HISTORY: Oncology History    Cancer Staging Malignant neoplasm of upper-outer quadrant of right breast in female, estrogen receptor positive ([place]) Staging form: Breast, AJCC 8th Edition - Clinical stage from 06/11/2017: Stage IB (cT3, cN0, cM0, G2, ER: Positive, PR: Positive, HER2: Positive) - Signed by [name], MD on 06/16/2017 - Pathologic stage from 07/22/2017: Stage IB (pT3(m), pN2a(sn), cM0, G2, ER: Positive, PR: Positive, HER2: Positive) - Signed by [name], NP on 08/04/2017      Malignant neoplasm of upper-outer quadrant of right breast in female, estrogen receptor positive ([place])   06/11/2017 Initial Biopsy    Diagnosis 06/11/17 Breast, right, needle core biopsy, 11:00 o'clock - INVASIVE DUCTAL CARCINOMA, G2      06/11/2017 Mammogram    [name] and MM Diagnostic Breast Tomo Bilateral 06/11/17 IMPRESSION: 1. Highly suspicious mass within the retroareolar right breast, extending from the 8:00 to 11:00 axes, anterior to posterior depth, extending anteriorly to the nipple with associated nipple retraction, measuring at least 5.5 cm by ultrasound  2. No sonographic evidence of metastatic lymphadenopathy in the right axilla. 3. No evidence of malignancy within the left breast.       06/11/2017 Initial Diagnosis    Malignant neoplasm of upper-outer quadrant of right breast in female, estrogen receptor positive ([place])      06/11/2017 Receptors her2    Estrogen Receptor: 100%, POSITIVE, STRONG STAINING INTENSITY Progesterone  Receptor: 100%, POSITIVE, STRONG STAINING INTENSITY Proliferation Marker Ki67: 12%Proliferation Marker Ki67: 12%  HER2 - **POSITIVE** RATIO OF HER2/CEP17 SIGNALS 2.55 AVERAGE HER2 COPY NUMBER PER CELL 4.85      06/25/2017 Genetic Testing    Negative genetic testing on the 9 gene STAT panel.  The STAT Breast cancer panel offered by [facility] includes sequencing and rearrangement analysis for the following 9 genes:  ATM, BRCA1, BRCA2, CDH1, CHEK2, PALB2, PTEN, STK11 and TP53.   The report date is June 25, 2017.  Negative genetic testing on the common hereditary cancer panel.  The Hereditary Gene Panel offered by [facility] includes sequencing and/or deletion duplication testing of the following 46 genes: APC, ATM, AXIN2, BARD1, BMPR1A, BRCA1, BRCA2, BRIP1, CDH1, CDKN2A (p14ARF), CDKN2A (p16INK4a), CHEK2, CTNNA1, DICER1, EPCAM (Deletion/duplication testing only), GREM1 (promoter region deletion/duplication testing only), KIT, MEN1, MLH1, MSH2, MSH3, MSH6, MUTYH, NBN, NF1, NHTL1, PALB2, PDGFRA, PMS2, POLD1, POLE, PTEN, RAD50, RAD51C, RAD51D, SDHB, SDHC, SDHD, SMAD4, SMARCA4. STK11, TP53, TSC1, TSC2, and VHL.  The following genes were evaluated for sequence changes only: SDHA and HOXB13 c.251G>A variant only.  The report date is June 25, 2017.       07/02/2017 Imaging    CT cAP 07/02/17 IMPRESSION: 1. Subareolar right breast mass. No compelling findings of nodal or metastatic involvement. 2. Old granulomatous disease. 3. There is a 7 mm enhancing or hyperdense focus posteriorly in segment 7 of the liver in the subcapsular region. By virtue of its small size this lesion is technically nonspecific although statistically likely to be a small benign lesions such as flash filling hemangioma. This may warrant surveillance. 4.  Prominent stool throughout the colon favors constipation. 5. 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      07/02/2017 Imaging    Bone  scan 07/02/17 IMPRESSION: Today' s exam is negative. I ascribed the tiny focus of activity just proximal to the left antecubital region to injection site.      07/22/2017 Surgery    RIGHT TOTAL MASTECTOMY WITH RIGHT AXILLARY SENTINEL LYMPH NODE BIOPSY, LEFT PROPHYLACTIC MASTECTOM and INSERTION PORT-A-CATH WITH [name] by [name] and [facility]       07/22/2017 Pathology Results    Diagnosis 1. Breast, simple mastectomy, Left - FIBROADENOMA. - NO MALIGNANCY IDENTIFIED. 2. 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 - ADDITIONAL FOCUS OF LOBULAR CARCINOMA, GRADE 2, SPANNING 2.2 CM. - INTERMEDIATE GRADE DUCTAL CARCINOMA IN SITU. - INVASIVE CARCINOMA COMES TO WITHIN 0.2 TO 0.3 CM OF THE DEEP MARGIN, FOCALLY. - TUMOR FOCALLY INVOLVES EPIDERMIS. - LYMPHOVASCULAR INVASION OF DERMAL LYMPHATICS. - ONE OF ONE LYMPH NODES NEGATIVE FOR CARCINOMA (0/1). - SEE ONCOLOGY TABLE. 3. Lymph node, sentinel, biopsy, Right axillary - ONE LYMPH NODE WITH ISOLATED TUMOR CELLS (0/1). 4. Lymph node, sentinel, biopsy, Right - METASTATIC CARCINOMA IN ONE OF ONE LYMPH NODES (1/1). 5. Lymph node, sentinel, biopsy, Right - ONE OF ONE LYMPH NODES NEGATIVE FOR CARCINOMA (0/1). 6. Lymph node, sentinel, biopsy, Right - METASTATIC CARCINOMA IN ONE OF ONE LYMPH NODES (1/1). - EXTRACAPSULAR EXTENSION. 7. Lymph node, sentinel, biopsy, Right - METASTATIC CARCINOMA IN ONE OF ONE LYMPH NODES (1/1). - EXTRACAPSULAR EXTENSION. 8. Lymph node, sentinel, biopsy, Right - ONE OF ONE LYMPH NODES NEGATIVE FOR CARCINOMA (0/1). 9. Lymph node, sentinel, biopsy, Right - ONE OF ONE LYMPH NODES NEGATIVE FOR CARCINOMA (0/1). 10. Lymph node, sentinel, biopsy, Right - METASTATIC CARCINOMA IN ONE OF ONE LYMPH NODES (1/1). - EXTRACAPSULAR EXTENSION. 11. Lymph node, biopsy, Right axillary - ONE OF ONE LYMPH NODES NEGATIVE FOR CARCINOMA (0/1).       07/22/2017 Receptors her2     Ductal carcinoma in primary breast tumor and node metastasis are ER 95%, PR 95% strongly positive, and HER2 + The lobular component in primary breast tumor is ER 95% positive, PR 95% positive, strong staining, HER-2 negative.       08/25/2017 Imaging    MRI abdomen done [facility]:  Enhancing structure measing 5 mm is compatible with a flash filling hemangioma. No specific findings to suggest metastatic cancer      08/25/2017 Imaging    MRI Abdomen at [facility] 08/25/17  IMPRESSION: 1. Lesion in segment 7 of the liver has signal and enhancement characteristics compatible with a flash filling hemangioma. No specific findings identified to suggest metastatic disease.      08/29/2017 -  Chemotherapy    adjuvant TCHP every 3 weeks for 6 cycles starting 08/29/17, followed by maintenance Herceptin with or without Perjeta for 6-12 months. Held after cycle 1 due to Sepsis and hospitalization.         09/04/2017 - 09/09/2017 Hospital Admission    Admit date: 09/04/2017 Discharge date: 09/09/2017  DISCHARGE DIAGNOSES:  Principal Problem:   Sepsis [facility]) Active Problems:   Malignant neoplasm of upper-outer quadrant of right breast in female, estrogen receptor positive ([place])   Port-A-Cath in place   Neutropenic fever ([place])   Hyponatremia   Malignant neoplasm of breast in female, estrogen receptor positive ([place])      CURRENT THERAPY:adjuvant chemotherapy TCHP every 3 weeks for 6 cycles beginning 08/29/2017.Decrease Carbo with cycle 2  on 09/19/17 due to neutropenic fever.Neulasta from cycle 2 due to febrile neutropenia  INTERVAL HISTORY: Sheila [name] returns for follow-up as scheduled 1 week status post cycle 3 TCH P on 10/10/2017.  She has felt well all week but woke up this morning with mild weakness.  Intermittent mild nausea well controlled with Phenergan as needed, no emesis.  Denies diarrhea or loose BM with this cycle.  She has had good p.o. intake this week, began drinking boost; weight  is up 2 pounds.  She had low back soreness for 2 days after Neulasta, did not require medication.  Denies fever, cough, chest pain.  Attributes mild dyspnea to nasal/sinus congestion. Had 2 nosebleeds that were "heavy but quick."  She denies any other specific concerns.  REVIEW OF SYSTEMS:   Constitutional: Denies fevers, chills or abnormal weight loss (+) mild fatigue (+) good po intake, started drinking boost Eyes: Denies blurriness of vision Ears, nose, mouth, throat, and face: Denies mucositis or sore throat (+) 2 episodes "heavy but quick" nosebleeds  Respiratory: Denies cough or wheezes (+) mild dyspnea attributable to nasal/sinus congestion Cardiovascular: Denies palpitation, chest discomfort or lower extremity swelling Gastrointestinal:  Denies vomiting, constipation, diarrhea, heartburn or change in bowel habits (+) mild intermittent nausea, controlled with PRN phenergan; no emesis  Skin: Denies abnormal skin rashes Lymphatics: Denies new lymphadenopathy or easy bruising Neurological:Denies numbness, tingling or new weaknesses Behavioral/Psych: Mood is stable, no new changes  All other systems were reviewed with the patient and are negative.  MEDICAL HISTORY:  Past Medical History:  Diagnosis Date  . Anxiety   . Cancer [facility])    right breast   . Family history of breast cancer   . Family history of colon cancer   . GERD (gastroesophageal reflux disease)   . Headache    couple of migraines in the past  . History of kidney stones   . Vitamin D deficiency     SURGICAL HISTORY: Past Surgical History:  Procedure Laterality Date  . egg donation    . kidney stone removal    . MASTECTOMY W/ SENTINEL NODE BIOPSY Bilateral 07/22/2017   Procedure: RIGHT TOTAL MASTECTOMY WITH RIGHT AXILLARY SENTINEL LYMPH NODE BIOPSY, LEFT PROPHYLACTIC MASTECTOMY;  Surgeon: [name], MD;  Location: [place];  Service: General;  Laterality: Bilateral;  . PORTACATH PLACEMENT Right 07/22/2017    Procedure: INSERTION PORT-A-CATH WITH [name];  Surgeon: [name], MD;  Location: [place];  Service: General;  Laterality: Right;    I have reviewed the social history and family history with the patient and they are unchanged from previous note.  ALLERGIES:  has No Known Allergies.  MEDICATIONS:  Current Outpatient Medications  Medication Sig Dispense Refill  . calcium carbonate (TUMS - DOSED IN MG ELEMENTAL CALCIUM) 500 MG chewable tablet Chew 2 tablets by mouth as needed for indigestion or heartburn.     . Cholecalciferol (VITAMIN D) 2000 units tablet Take 2,000 Units by mouth daily.    Marland Kitchen dexamethasone (DECADRON) 4 MG tablet Take 2 tablets (8 mg total) by mouth 2 (two) times daily. Start the day before Taxotere. Then again the day after chemo for 3 days. 30 tablet 1  . diphenhydramine-acetaminophen (TYLENOL PM) 25-500 MG TABS tablet Take 2 tablets by mouth at bedtime. sleep    . diphenoxylate-atropine (LOMOTIL) 2.5-0.025 MG tablet Take 1-2 tablets by mouth 4 (four) times daily as needed for diarrhea or loose stools. 30 tablet 2  . fluticasone (FLONASE) 50 MCG/ACT nasal spray Place 1  spray into both nostrils daily.    Marland Kitchen lidocaine-prilocaine (EMLA) cream Apply to affected area once 30 g 3  . loperamide (IMODIUM) 2 MG capsule Take 1 capsule (2 mg total) by mouth 3 (three) times daily as needed for diarrhea or loose stools. 30 capsule 0  . mirtazapine (REMERON) 15 MG tablet Take 1 tablet (15 mg total) by mouth at bedtime. 30 tablet 0  . naproxen sodium (ANAPROX) 220 MG tablet Take 220 mg 2 (two) times daily as needed by mouth (pain).     Marland Kitchen omeprazole (PRILOSEC) 20 MG capsule Take 20 mg by mouth at bedtime.     . potassium chloride (K-DUR) 10 MEQ tablet Take 2 tablets (20 mEq total) daily by mouth. Take 2 tabs twice daily for 5 days then take 2 tablets daily 60 tablet 1  . promethazine (PHENERGAN) 12.5 MG tablet Take 1 tablet (12.5 mg total) every 6 (six) hours as needed by mouth for nausea or  vomiting. 30 tablet 2  . prochlorperazine (COMPAZINE) 10 MG tablet Take 1 tablet (10 mg total) by mouth every 6 (six) hours as needed (Nausea or vomiting). (Patient not taking: Reported on 10/17/2017) 30 tablet 1   Current Facility-Administered Medications  Medication Dose Route Frequency Provider Last Rate Last Dose  . gi cocktail (Maalox,Lidocaine,Donnatal)  30 mL Oral Once Harle Stanford., PA-C       Facility-Administered Medications Ordered in Other Visits  Medication Dose Route Frequency Provider Last Rate Last Dose  . sodium chloride flush (NS) 0.9 % injection 10 mL  10 mL Intravenous PRN [name], NP   10 mL at 10/17/17 1347    PHYSICAL EXAMINATION: ECOG PERFORMANCE STATUS: 1 - Symptomatic but completely ambulatory  Vitals:   10/17/17 1020 10/17/17 1103  BP:    Pulse: (!) 128 (!) 134  Resp:  20  Temp:    SpO2:     Filed Weights   10/17/17 1005  Weight: 162 lb 12.8 oz (73.8 kg)    GENERAL:alert, no distress and comfortable SKIN: skin color, texture, turgor are normal, no rashes or significant lesions EYES: normal, Conjunctiva are pink and non-injected, sclera clear OROPHARYNX:no exudate, no erythema and lips, buccal mucosa, and tongue normal  NECK: supple, thyroid normal size, non-tender, without nodularity LYMPH:  no palpable cervical, supraclavicular, or axillary lymphadenopathy LUNGS: clear to auscultation bilaterally with normal breathing effort HEART: Regular rhythm and no murmurs and no lower extremity edema (+) tachycardic ABDOMEN:abdomen soft, non-tender and normal bowel sounds. No palpable hepatomegaly Musculoskeletal:no cyanosis of digits and no clubbing  NEURO: alert & oriented x 3 with fluent speech, no focal motor/sensory deficits Breasts: s/p bilateral mastectomy. Surgical scars are well healed PAC without erythema.   LABORATORY DATA:  I have reviewed the data as listed CBC Latest Ref Rng & Units 10/17/2017 10/10/2017 10/02/2017  WBC 3.9 - 10.3  10e3/uL 6.2 8.8 7.5  Hemoglobin 11.6 - 15.9 g/dL 10.3(L) 9.9(L) 9.9(L)  Hematocrit 34.8 - 46.6 % 31.2(L) 30.0(L) 30.0(L)  Platelets 145 - 400 10e3/uL 140(L) 116(L) 99(L)    CMP Latest Ref Rng & Units 10/17/2017 10/10/2017 10/02/2017  Glucose 70 - 140 mg/dl 126 147(H) 127  BUN 7.0 - 26.0 mg/dL 9.6 13.0 8.7  Creatinine 0.6 - 1.1 mg/dL 0.9 1.0 0.9  Sodium 136 - 145 mEq/L 137 139 142  Potassium 3.5 - 5.1 mEq/L 3.5 4.5 2.8(LL)  Chloride 101 - 111 mmol/L - - -  CO2 22 - 29 mEq/L 27 21(L) 28  Calcium  8.4 - 10.4 mg/dL 8.7 9.7 8.8  Total Protein 6.4 - 8.3 g/dL 6.6 6.9 6.2(L)  Total Bilirubin 0.20 - 1.20 mg/dL 0.42 0.48 0.42  Alkaline Phos 40 - 150 U/L 78 72 79  AST 5 - 34 U/L 14 39(H) 16  ALT 0 - 55 U/L 22 88(H) 19    RADIOGRAPHIC STUDIES: I have personally reviewed the radiological images as listed and agreed with the findings in the report. No results found.   ASSESSMENT & PLAN: Sheila [name] a 41 y.o.premenopausal female with a history of Vitamin D deficiency and high cholesterol and kidney stones, presented with a palpable right breast mass.   1. Malignant neoplasm of upper-outer quadrant of right breast in female, mixed invasive ductal and lobular carcinoma, pT3(m)N2aM0, stage 1b, ductal carcinoma tripple positive, lobular carcinoma ER+/PR+/HER2-, Grade 2 2. Genetics 3. Hyperglycemia 4. Social work, Insurance underwriter 5. Febrile neutropenia 6. Nausea, diarrhea 7. 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 Thrombocytopenia 10. Elevated transaminases 11. Insomnia   12. Nosebleed, thrombocytopenia  Ms. Donley appears stable today. She tolerated cycle 3 TCHP well overall with mild nausea and bone pain. She developed heavy nosebleeds x2; plt 140K today but likely to have been lower this week. She does not require platelet transfusion. Hgb stable 10.3. I recommend avoiding NSAIDs, blowing nose and may use cool mist humidifier to moisten air at home. CBC and Cmet otherwise  unremarkable. K WNL, she will continue current daily 20 mEq K. She is tachycardic today, HR 134 apical. HR range 73 - 120 at other encounters. BP stable for her, 110/76. Denies associated chest pain or dyspnea. I suspect mild dehydration, she will get 1 L NS over 2 hours today. Return in 2 weeks for f/u and cycle 4 TCHP.  PLAN: For nosebleeds, avoid noseblowing and NSAIDs; may use cool mist humidifier Tachycardia, VS otherwise stable; 1 L NS for mild dehydration Return in 2 weeks for f/u and cycle 4 TCHP   All questions were answered. The patient knows to call the clinic with any problems, questions or concerns. No barriers to learning was detected.     [name], NP 10/17/17

## 2017-10-17 NOTE — Patient Instructions (Signed)
Dehydration, Adult Dehydration is when there is not enough fluid or water in your body. This happens when you lose more fluids than you take in. Dehydration can range from mild to very bad. It should be treated right away to keep it from getting very bad. Symptoms of mild dehydration may include:  Thirst.  Dry lips.  Slightly dry mouth.  Dry, warm skin.  Dizziness. Symptoms of moderate dehydration may include:  Very dry mouth.  Muscle cramps.  Dark pee (urine). Pee may be the color of tea.  Your body making less pee.  Your eyes making fewer tears.  Heartbeat that is uneven or faster than normal (palpitations).  Headache.  Light-headedness, especially when you stand up from sitting.  Fainting (syncope). Symptoms of very bad dehydration may include:  Changes in skin, such as: ? Cold and clammy skin. ? Blotchy (mottled) or pale skin. ? Skin that does not quickly return to normal after being lightly pinched and let go (poor skin turgor).  Changes in body fluids, such as: ? Feeling very thirsty. ? Your eyes making fewer tears. ? Not sweating when body temperature is high, such as in hot weather. ? Your body making very little pee.  Changes in vital signs, such as: ? Weak pulse. ? Pulse that is more than 100 beats a minute when you are sitting still. ? Fast breathing. ? Low blood pressure.  Other changes, such as: ? Sunken eyes. ? Cold hands and feet. ? Confusion. ? Lack of energy (lethargy). ? Trouble waking up from sleep. ? Short-term weight loss. ? Unconsciousness. Follow these instructions at home:  If told by your doctor, drink an ORS: ? Make an ORS by using instructions on the package. ? Start by drinking small amounts, about  cup (120 mL) every 5-10 minutes. ? Slowly drink more until you have had the amount that your doctor said to have.  Drink enough clear fluid to keep your pee clear or pale yellow. If you were told to drink an ORS, finish the ORS  first, then start slowly drinking clear fluids. Drink fluids such as: ? Water. Do not drink only water by itself. Doing that can make the salt (sodium) level in your body get too low (hyponatremia). ? Ice chips. ? Fruit juice that you have added water to (diluted). ? Low-calorie sports drinks.  Avoid: ? Alcohol. ? Drinks that have a lot of sugar. These include high-calorie sports drinks, fruit juice that does not have water added, and soda. ? Caffeine. ? Foods that are greasy or have a lot of fat or sugar.  Take over-the-counter and prescription medicines only as told by your doctor.  Do not take salt tablets. Doing that can make the salt level in your body get too high (hypernatremia).  Eat foods that have minerals (electrolytes). Examples include bananas, oranges, potatoes, tomatoes, and spinach.  Keep all follow-up visits as told by your doctor. This is important. Contact a doctor if:  You have belly (abdominal) pain that: ? Gets worse. ? Stays in one area (localizes).  You have a rash.  You have a stiff neck.  You get angry or annoyed more easily than normal (irritability).  You are more sleepy than normal.  You have a harder time waking up than normal.  You feel: ? Weak. ? Dizzy. ? Very thirsty.  You have peed (urinated) only a small amount of very dark pee during 6-8 hours. Get help right away if:  You have symptoms of   very bad dehydration.  You cannot drink fluids without throwing up (vomiting).  Your symptoms get worse with treatment.  You have a fever.  You have a very bad headache.  You are throwing up or having watery poop (diarrhea) and it: ? Gets worse. ? Does not go away.  You have blood or something green (bile) in your throw-up.  You have blood in your poop (stool). This may cause poop to look black and tarry.  You have not peed in 6-8 hours.  You pass out (faint).  Your heart rate when you are sitting still is more than 100 beats a  minute.  You have trouble breathing. This information is not intended to replace advice given to you by your health care provider. Make sure you discuss any questions you have with your health care provider. Document Released: 08/31/2009 Document Revised: 05/24/2016 Document Reviewed: 12/29/2015 Elsevier Interactive Patient Education  2018 [employer]  

## 2017-10-17 NOTE — Progress Notes (Signed)
Cira Rue, NP aware of VS post IVF. Pt okay to be d/c. Shared with pt per NP that platelets low but okay today. Pt to let us know if any changes in bleeding or clotting. Pt and husband verbalized understanding.

## 2017-10-22 ENCOUNTER — Ambulatory Visit: Payer: PRIVATE HEALTH INSURANCE | Attending: General Surgery | Admitting: Physical Therapy

## 2017-10-22 DIAGNOSIS — M25611 Stiffness of right shoulder, not elsewhere classified: Secondary | ICD-10-CM | POA: Diagnosis present

## 2017-10-22 DIAGNOSIS — Z483 Aftercare following surgery for neoplasm: Secondary | ICD-10-CM | POA: Diagnosis present

## 2017-10-22 DIAGNOSIS — M6281 Muscle weakness (generalized): Secondary | ICD-10-CM | POA: Diagnosis present

## 2017-10-22 DIAGNOSIS — M25612 Stiffness of left shoulder, not elsewhere classified: Secondary | ICD-10-CM | POA: Insufficient documentation

## 2017-10-22 DIAGNOSIS — R293 Abnormal posture: Secondary | ICD-10-CM | POA: Diagnosis present

## 2017-10-22 DIAGNOSIS — R600 Localized edema: Secondary | ICD-10-CM | POA: Diagnosis present

## 2017-10-22 NOTE — Therapy (Signed)
[place], [place], [address] Phone: [phone]   Fax:  [phone]  Physical Therapy Treatment  Patient Details  Name: Sheila [name] MRN: [id number] Date of Birth: September 20, 1976 Referring Provider: Dr. [name]   Encounter Date: 10/22/2017  PT End of Session - 10/22/17 1153    Visit Number  15    Number of Visits  23    Date for PT Re-Evaluation  11/28/17    PT Start Time  1102    PT Stop Time  1145    PT Time Calculation (min)  43 min    Activity Tolerance  Patient tolerated treatment well    Behavior During Therapy  [facility] for tasks assessed/performed       Past Medical History:  Diagnosis Date  . Anxiety   . Cancer [facility])    right breast   . Family history of breast cancer   . Family history of colon cancer   . GERD (gastroesophageal reflux disease)   . Headache    couple of migraines in the past  . History of kidney stones   . Vitamin D deficiency     Past Surgical History:  Procedure Laterality Date  . egg donation    . kidney stone removal    . MASTECTOMY W/ SENTINEL NODE BIOPSY Bilateral 07/22/2017   Procedure: RIGHT TOTAL MASTECTOMY WITH RIGHT AXILLARY SENTINEL LYMPH NODE BIOPSY, LEFT PROPHYLACTIC MASTECTOMY;  Surgeon: [name], MD;  Location: [place];  Service: General;  Laterality: Bilateral;  . PORTACATH PLACEMENT Right 07/22/2017   Procedure: INSERTION PORT-A-CATH WITH [name];  Surgeon: [name], MD;  Location: [place];  Service: General;  Laterality: Right;    There were no vitals filed for this visit.  Subjective Assessment - 10/22/17 1106    Subjective  Doing exercises but not every day.  Had chemo last week.    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 It measures 5.5 cm and is located in the upper outer quadrant with a Ki67 of 12%. , Bilateral mastectomies on 07/22/17  with SLNB on R with 2 to 3 additional nodes taken, pt has to complete chemo and radiation and will begin chemo on Aug 29, 2017    Currently in Pain?  No/denies         [facility] PT Assessment - 10/22/17 0001      AROM   Right Shoulder Flexion  141 Degrees in sitting                  [facility] Adult PT Treatment/Exercise - 10/22/17 0001      Exercises   Exercises  Other Exercises    Other Exercises   Instructed patient in the rest of strength ABC program starting with squats, and she performed all for 10 reps with 1 lb. weight.      Shoulder Exercises: Pulleys   Flexion  2 minutes with stretch at end range    ABduction  2 minutes with stretch at end range      Shoulder Exercises: Therapy Ball   Flexion  10 reps with stretch at end      Manual Therapy   Myofascial Release  holding right shoulder in flexion with opposite pull at right abdomen; from right upper arm in abduction to right flank    Passive ROM  within pain limits for right shoulder in flexion, abduction, ER, and IR  PT Education - 10/22/17 1153    Education provided  Yes    Education Details  strength ABC program    Person(s) Educated  Patient    Methods  Explanation;Verbal cues;Handout    Comprehension  Verbalized understanding;Returned demonstration           Breast Clinic Goals - 06/18/17 1221      Patient will be able to verbalize understanding of pertinent lymphedema risk reduction practices relevant to her diagnosis specifically related to skin care.   Time  1    Period  Days    Status  Achieved      Patient will be able to return demonstrate and/or verbalize understanding of the post-op home exercise program related to regaining shoulder range of motion.   Time  1    Period  Days    Status  Achieved      Patient will be able to verbalize understanding of the importance of attending the postoperative After Breast Cancer Class for further lymphedema risk reduction education  and therapeutic exercise.   Time  1    Period  Days    Status  Achieved       Long Term Clinic Goals - 10/22/17 1144      CC Long Term Goal  #1   Title  Pt to demonstrate 165 degrees of bilateral shoulder flexion to allow her to reach up and overhead    Baseline  R 98, L 125, 08/25/17- R 122, L 141, 09/10/17- 132 on R, 148 on L, 09/29/17- R 135, L 156, 10/08/17- R 144, L 169; 10/22/17-Rt. 141    Status  Partially Met      CC Long Term Goal  #2   Title  Pt to demonstrate 165 degrees of bilateral shoulder abduction to allow pt to reach out to sides    Status  Achieved      CC Long Term Goal  #3   Title  Pt to demonstrate 70 degrees of right shoulder internal rotation to allow her to return to prior level of function    Status  Achieved      CC Long Term Goal  #4   Title  Pt to report a 75% improvement in right trunk edema to allow improved comfort and sleep    Status  On-going      CC Long Term Goal  #5   Title  Pt to be independent in a home exercise program for continued strengthening and stretching    Status  On-going         Plan - 10/22/17 1155    Clinical Impression Statement  Pt. did well with finishing instruction in strength ABC program.  Still with tightness at right shoulder and has not yet met her active flexion ROM goal.  Other goals are partially met or met, but plan to continue for 3-4 more weeks toward achieving all goals.    Rehab Potential  Good    Clinical Impairments Affecting Rehab Potential  pt to begin chemo Oct 12 and will have it every 3 weeks     PT Frequency  2x / week    PT Duration  4 weeks    PT Treatment/Interventions  ADLs/Self Care Home Management;Therapeutic activities;Therapeutic exercise;Orthotic Fit/Training;Patient/family education;Manual techniques;Manual lymph drainage;Scar mobilization;Passive range of motion;Taping    PT Next Visit Plan  Continue P/AA/A/ROM to right shoulder; continue to work on long term goals    PT Home Exercise Plan   Post op shoulder  ROM HEP advised pt to not lift arms above 90 degrees until a week after drains removed, supine dowel; supine scapular series; strength ABC program    Consulted and Agree with Plan of Care  Patient       Patient will benefit from skilled therapeutic intervention in order to improve the following deficits and impairments:  Postural dysfunction, Decreased knowledge of precautions, Pain, Impaired UE functional use, Decreased range of motion, Decreased strength, Decreased scar mobility, Increased fascial restricitons  Visit Diagnosis: Stiffness of right shoulder, not elsewhere classified - Plan: PT plan of care cert/re-cert  Stiffness of left shoulder, not elsewhere classified - Plan: PT plan of care cert/re-cert  Abnormal posture - Plan: PT plan of care cert/re-cert  Muscle weakness (generalized) - Plan: PT plan of care cert/re-cert  Aftercare following surgery for neoplasm - Plan: PT plan of care cert/re-cert     Problem List Patient Active Problem List   Diagnosis Date Noted  . Sepsis ([place]) 09/04/2017  . Neutropenic fever ([place]) 09/04/2017  . Hyponatremia 09/04/2017  . Port-A-Cath in place 08/28/2017  . Genetic testing 06/25/2017  . Family history of breast cancer   . Family history of colon cancer   . Malignant neoplasm of upper-outer quadrant of right breast in female, estrogen receptor positive ([place]) 06/16/2017    SALISBURY,DONNA 10/22/2017, 11:58 AM  [place], [place], [address] Phone: [phone]   Fax:  [phone]  Name: Sheila [name] MRN: [id number] Date of Birth: [birth year]  Serafina Royals, PT 10/22/17 11:59 AM

## 2017-10-24 ENCOUNTER — Ambulatory Visit: Payer: PRIVATE HEALTH INSURANCE

## 2017-10-24 DIAGNOSIS — R293 Abnormal posture: Secondary | ICD-10-CM

## 2017-10-24 DIAGNOSIS — M25611 Stiffness of right shoulder, not elsewhere classified: Secondary | ICD-10-CM

## 2017-10-24 DIAGNOSIS — Z483 Aftercare following surgery for neoplasm: Secondary | ICD-10-CM

## 2017-10-24 NOTE — Therapy (Signed)
[place], [place], [address] Phone: [phone]   Fax:  [phone]  Physical Therapy Treatment  Patient Details  Name: Sheila [name] MRN: [id number] Date of Birth: 12-Feb-1976 Referring Provider: Dr. [name]   Encounter Date: 10/24/2017  PT End of Session - 10/24/17 0934    Visit Number  16    Number of Visits  23    Date for PT Re-Evaluation  11/28/17    PT Start Time  0852    PT Stop Time  0933    PT Time Calculation (min)  41 min    Activity Tolerance  Patient tolerated treatment well    Behavior During Therapy  [facility] for tasks assessed/performed       Past Medical History:  Diagnosis Date  . Anxiety   . Cancer [facility])    right breast   . Family history of breast cancer   . Family history of colon cancer   . GERD (gastroesophageal reflux disease)   . Headache    couple of migraines in the past  . History of kidney stones   . Vitamin D deficiency     Past Surgical History:  Procedure Laterality Date  . egg donation    . kidney stone removal    . MASTECTOMY W/ SENTINEL NODE BIOPSY Bilateral 07/22/2017   Procedure: RIGHT TOTAL MASTECTOMY WITH RIGHT AXILLARY SENTINEL LYMPH NODE BIOPSY, LEFT PROPHYLACTIC MASTECTOMY;  Surgeon: [name], MD;  Location: [place];  Service: General;  Laterality: Bilateral;  . PORTACATH PLACEMENT Right 07/22/2017   Procedure: INSERTION PORT-A-CATH WITH [name];  Surgeon: [name], MD;  Location: [place];  Service: General;  Laterality: Right;    There were no vitals filed for this visit.  Subjective Assessment - 10/24/17 0857    Subjective  Doing good, felt fine after last session. Have another week before my next chemo treatment.     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 It measures 5.5 cm and is located in the upper outer quadrant with a Ki67 of 12%. ,  Bilateral mastectomies on 07/22/17 with SLNB on R with 2 to 3 additional nodes taken, pt has to complete chemo and radiation and will begin chemo on Aug 29, 2017    Patient Stated Goals  to return to prior level of function, be able to sleep with no discomfort, get ROM back to where it was    Currently in Pain?  No/denies         [facility] PT Assessment - 10/24/17 0001      AROM   Right Shoulder Flexion  148 Degrees in sitting after stretching    Right Shoulder ABduction  170 Degrees                  [facility] Adult PT Treatment/Exercise - 10/24/17 0001      Manual Therapy   Myofascial Release  holding right shoulder in flexion with opposite pull at right abdomen; from right upper arm in abduction to right flank    Passive ROM  In Supine Right shoulder in flexion, abduction, ER, IR, and D2 all to pts end ROM, no pain reported with this today                   Breast Clinic Goals - 06/18/17 1221      Patient will be able to verbalize understanding of pertinent  lymphedema risk reduction practices relevant to her diagnosis specifically related to skin care.   Time  1    Period  Days    Status  Achieved      Patient will be able to return demonstrate and/or verbalize understanding of the post-op home exercise program related to regaining shoulder range of motion.   Time  1    Period  Days    Status  Achieved      Patient will be able to verbalize understanding of the importance of attending the postoperative After Breast Cancer Class for further lymphedema risk reduction education and therapeutic exercise.   Time  1    Period  Days    Status  Achieved       Long Term Clinic Goals - 10/22/17 1144      CC Long Term Goal  #1   Title  Pt to demonstrate 165 degrees of bilateral shoulder flexion to allow her to reach up and overhead    Baseline  R 98, L 125, 08/25/17- R 122, L 141, 09/10/17- 132 on R, 148 on L, 09/29/17- R 135, L 156, 10/08/17- R 144, L 169; 10/22/17-Rt.  141    Status  Partially Met      CC Long Term Goal  #2   Title  Pt to demonstrate 165 degrees of bilateral shoulder abduction to allow pt to reach out to sides    Status  Achieved      CC Long Term Goal  #3   Title  Pt to demonstrate 70 degrees of right shoulder internal rotation to allow her to return to prior level of function    Status  Achieved      CC Long Term Goal  #4   Title  Pt to report a 75% improvement in right trunk edema to allow improved comfort and sleep    Status  On-going      CC Long Term Goal  #5   Title  Pt to be independent in a home exercise program for continued strengthening and stretching    Status  On-going         Plan - 10/24/17 0934    Clinical Impression Statement  Pt tolerated stretching very well today with no c/o pain. Focused on Rt side today per her request of this being the tightest and most limiting. She reports noticing being able to reach higher with ADLs and end ROM is beginning to feel looser, though overall still limited. Her A/ROM measurements have improved with flelxion and abduction.     Rehab Potential  Good    Clinical Impairments Affecting Rehab Potential  pt to begin chemo Oct 12 and will have it every 3 weeks     PT Frequency  2x / week    PT Duration  4 weeks    PT Treatment/Interventions  ADLs/Self Care Home Management;Therapeutic activities;Therapeutic exercise;Orthotic Fit/Training;Patient/family education;Manual techniques;Manual lymph drainage;Scar mobilization;Passive range of motion;Taping    PT Next Visit Plan  Continue P/AA/A/ROM to right shoulder; continue to work on long term goals    PT Home Exercise Plan  Post op shoulder ROM HEP advised pt to not lift arms above 90 degrees until a week after drains removed, supine dowel; supine scapular series; strength ABC program    Consulted and Agree with Plan of Care  Patient       Patient will benefit from skilled therapeutic intervention in order to improve the following  deficits and impairments:  Postural dysfunction,  Decreased knowledge of precautions, Pain, Impaired UE functional use, Decreased range of motion, Decreased strength, Decreased scar mobility, Increased fascial restricitons  Visit Diagnosis: Stiffness of right shoulder, not elsewhere classified  Abnormal posture  Aftercare following surgery for neoplasm     Problem List Patient Active Problem List   Diagnosis Date Noted  . Sepsis ([place]) 09/04/2017  . Neutropenic fever ([place]) 09/04/2017  . Hyponatremia 09/04/2017  . Port-A-Cath in place 08/28/2017  . Genetic testing 06/25/2017  . Family history of breast cancer   . Family history of colon cancer   . Malignant neoplasm of upper-outer quadrant of right breast in female, estrogen receptor positive ([place]) 06/16/2017    Sheila [name], PTA 10/24/2017, 9:37 AM  [place], [place], [address] Phone: [phone]   Fax:  [phone]  Name: Arelia Volpe MRN: [id number] Date of Birth: 1976-10-27

## 2017-10-27 ENCOUNTER — Encounter: Payer: PRIVATE HEALTH INSURANCE | Admitting: Physical Therapy

## 2017-10-29 ENCOUNTER — Ambulatory Visit: Payer: PRIVATE HEALTH INSURANCE | Admitting: Physical Therapy

## 2017-10-29 ENCOUNTER — Encounter: Payer: Self-pay | Admitting: Physical Therapy

## 2017-10-29 DIAGNOSIS — M25611 Stiffness of right shoulder, not elsewhere classified: Secondary | ICD-10-CM

## 2017-10-29 DIAGNOSIS — M6281 Muscle weakness (generalized): Secondary | ICD-10-CM

## 2017-10-29 DIAGNOSIS — R600 Localized edema: Secondary | ICD-10-CM

## 2017-10-29 DIAGNOSIS — R293 Abnormal posture: Secondary | ICD-10-CM

## 2017-10-29 NOTE — Therapy (Signed)
[place], [place], [address] Phone: [phone]   Fax:  [phone]  Physical Therapy Treatment  Patient Details  Name: Sheila [name] MRN: [id number] Date of Birth: 04-Dec-1975 Referring Provider: Dr. [name]   Encounter Date: 10/29/2017  PT End of Session - 10/29/17 1432    Visit Number  17    Number of Visits  23    Date for PT Re-Evaluation  11/28/17    PT Start Time  1346    PT Stop Time  1428    PT Time Calculation (min)  42 min    Activity Tolerance  Patient tolerated treatment well    Behavior During Therapy  [facility] for tasks assessed/performed       Past Medical History:  Diagnosis Date  . Anxiety   . Cancer [facility])    right breast   . Family history of breast cancer   . Family history of colon cancer   . GERD (gastroesophageal reflux disease)   . Headache    couple of migraines in the past  . History of kidney stones   . Vitamin D deficiency     Past Surgical History:  Procedure Laterality Date  . egg donation    . kidney stone removal    . MASTECTOMY W/ SENTINEL NODE BIOPSY Bilateral 07/22/2017   Procedure: RIGHT TOTAL MASTECTOMY WITH RIGHT AXILLARY SENTINEL LYMPH NODE BIOPSY, LEFT PROPHYLACTIC MASTECTOMY;  Surgeon: [name], MD;  Location: [place];  Service: General;  Laterality: Bilateral;  . PORTACATH PLACEMENT Right 07/22/2017   Procedure: INSERTION PORT-A-CATH WITH [name];  Surgeon: [name], MD;  Location: [place];  Service: General;  Laterality: Right;    There were no vitals filed for this visit.  Subjective Assessment - 10/29/17 1347    Subjective  I am still having a little bit of tightness. My strength is gone.     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 It measures 5.5 cm and is located in the upper outer quadrant with a Ki67 of 12%. , Bilateral mastectomies on  07/22/17 with SLNB on R with 2 to 3 additional nodes taken, pt has to complete chemo and radiation and will begin chemo on Aug 29, 2017    Patient Stated Goals  to return to prior level of function, be able to sleep with no discomfort, get ROM back to where it was    Currently in Pain?  No/denies    Pain Score  0-No pain                      [facility] Adult PT Treatment/Exercise - 10/29/17 0001      Shoulder Exercises: Standing   External Rotation  Strengthening;Both;10 reps;Theraband    Theraband Level (Shoulder External Rotation)  Level 2 (Red)    Internal Rotation  Strengthening;Both;10 reps;Theraband    Theraband Level (Shoulder Internal Rotation)  Level 2 (Red)    Flexion  Strengthening;Both;10 reps;Theraband    Theraband Level (Shoulder Flexion)  Level 2 (Red)    Extension  Strengthening;Both;10 reps;Theraband    Theraband Level (Shoulder Extension)  Level 2 (Red)    Other Standing Exercises  3 way shoulder raises with 1 lb bilaterally x 10 each      Shoulder Exercises: Pulleys   Flexion  2 minutes with stretch at end range    ABduction  2 minutes with stretch  at end range      Manual Therapy   Manual Lymphatic Drainage (MLD)  in supine, short neck, superficial and deep abdominals, right groin and right axillo inguinal anastamosis being careful not to apply traction to incision, then to left sidelying for posterior interaxillary anastamosis and lateral chest                    Breast Clinic Goals - 06/18/17 1221      Patient will be able to verbalize understanding of pertinent lymphedema risk reduction practices relevant to her diagnosis specifically related to skin care.   Time  1    Period  Days    Status  Achieved      Patient will be able to return demonstrate and/or verbalize understanding of the post-op home exercise program related to regaining shoulder range of motion.   Time  1    Period  Days    Status  Achieved      Patient will be able to  verbalize understanding of the importance of attending the postoperative After Breast Cancer Class for further lymphedema risk reduction education and therapeutic exercise.   Time  1    Period  Days    Status  Achieved       Long Term Clinic Goals - 10/22/17 1144      CC Long Term Goal  #1   Title  Pt to demonstrate 165 degrees of bilateral shoulder flexion to allow her to reach up and overhead    Baseline  R 98, L 125, 08/25/17- R 122, L 141, 09/10/17- 132 on R, 148 on L, 09/29/17- R 135, L 156, 10/08/17- R 144, L 169; 10/22/17-Rt. 141    Status  Partially Met      CC Long Term Goal  #2   Title  Pt to demonstrate 165 degrees of bilateral shoulder abduction to allow pt to reach out to sides    Status  Achieved      CC Long Term Goal  #3   Title  Pt to demonstrate 70 degrees of right shoulder internal rotation to allow her to return to prior level of function    Status  Achieved      CC Long Term Goal  #4   Title  Pt to report a 75% improvement in right trunk edema to allow improved comfort and sleep    Status  On-going      CC Long Term Goal  #5   Title  Pt to be independent in a home exercise program for continued strengthening and stretching    Status  On-going         Plan - 10/29/17 1432    Clinical Impression Statement  Continued with strengthening exercises today. Pt reports she has not been doing her strengthening exercises daily and was encouraged to do so. She was also educated to complete Strength ABC program 3 days a week. She feels her swelling remains the same and at times is uncomfortable. Spent time focusing on decreasing right lateral trunk swelling today and encouraged pt to wear chip pack more often.     Rehab Potential  Good    Clinical Impairments Affecting Rehab Potential  pt to begin chemo Oct 12 and will have it every 3 weeks     PT Frequency  2x / week    PT Duration  4 weeks    PT Treatment/Interventions  ADLs/Self Care Home Management;Therapeutic  activities;Therapeutic exercise;Orthotic Fit/Training;Patient/family education;Manual techniques;Manual  lymph drainage;Scar mobilization;Passive range of motion;Taping    PT Next Visit Plan  increase rockwood to green band, increase 3 way shoulder to 2 lb, Continue P/AA/A/ROM to right shoulder; continue to work on long term goals    PT Home Exercise Plan  Post op shoulder ROM HEP advised pt to not lift arms above 90 degrees until a week after drains removed, supine dowel; supine scapular series; strength ABC program    Consulted and Agree with Plan of Care  Patient       Patient will benefit from skilled therapeutic intervention in order to improve the following deficits and impairments:  Postural dysfunction, Decreased knowledge of precautions, Pain, Impaired UE functional use, Decreased range of motion, Decreased strength, Decreased scar mobility, Increased fascial restricitons  Visit Diagnosis: Stiffness of right shoulder, not elsewhere classified  Abnormal posture  Localized edema  Muscle weakness (generalized)     Problem List Patient Active Problem List   Diagnosis Date Noted  . Sepsis ([name]) 09/04/2017  . Neutropenic fever ([place]) 09/04/2017  . Hyponatremia 09/04/2017  . Port-A-Cath in place 08/28/2017  . Genetic testing 06/25/2017  . Family history of breast cancer   . Family history of colon cancer   . Malignant neoplasm of upper-outer quadrant of right breast in female, estrogen receptor positive ([place]) 06/16/2017    Allyson Sabal [facility] 10/29/2017, 2:35 PM  [place], [place], [address] Phone: [phone]   Fax:  [phone]  Name: Sheila [name] MRN: [id number] Date of Birth: May 10, 1976  Manus Gunning, PT 10/29/17 2:36 PM

## 2017-10-30 NOTE — Progress Notes (Signed)
[place]  [phone] [phone]  Clinic Follow Up Note   Patient Care Team: [name], MD as PCP - General (Family Medicine) [name] (Family Medicine) [name], MD as Consulting Physician (General Surgery) [name], MD as Consulting Physician (Hematology) Kyung Rudd, MD as Consulting Physician (Radiation Oncology)   Date of Service:  10/31/2017  CHIEF COMPLAINTS:  Follow up right breast cancer    Oncology History    Cancer Staging Malignant neoplasm of upper-outer quadrant of right breast in female, estrogen receptor positive ([place]) Staging form: Breast, AJCC 8th Edition - Clinical stage from 06/11/2017: Stage IB (cT3, cN0, cM0, G2, ER: Positive, PR: Positive, HER2: Positive) - Signed by [name], MD on 06/16/2017 - Pathologic stage from 07/22/2017: Stage IB (pT3(m), pN2a(sn), cM0, G2, ER: Positive, PR: Positive, HER2: Positive) - Signed by [name], NP on 08/04/2017      Malignant neoplasm of upper-outer quadrant of right breast in female, estrogen receptor positive ([place])   06/11/2017 Initial Biopsy    Diagnosis 06/11/17 Breast, right, needle core biopsy, 11:00 o'clock - INVASIVE DUCTAL CARCINOMA, G2      06/11/2017 Mammogram    [name] and MM Diagnostic Breast Tomo Bilateral 06/11/17 IMPRESSION: 1. Highly suspicious mass within the retroareolar right breast, extending from the 8:00 to 11:00 axes, anterior to posterior depth, extending anteriorly to the nipple with associated nipple retraction, measuring at least 5.5 cm by ultrasound  2. No sonographic evidence of metastatic lymphadenopathy in the right axilla. 3. No evidence of malignancy within the left breast.       06/11/2017 Initial Diagnosis    Malignant neoplasm of upper-outer quadrant of right breast in female, estrogen receptor positive ([place])      06/11/2017 Receptors her2    Estrogen Receptor: 100%, POSITIVE, STRONG STAINING  INTENSITY Progesterone Receptor: 100%, POSITIVE, STRONG STAINING INTENSITY Proliferation Marker Ki67: 12%Proliferation Marker Ki67: 12%  HER2 - **POSITIVE** RATIO OF HER2/CEP17 SIGNALS 2.55 AVERAGE HER2 COPY NUMBER PER CELL 4.85      06/25/2017 Genetic Testing    Negative genetic testing on the 9 gene STAT panel.  The STAT Breast cancer panel offered by [facility] includes sequencing and rearrangement analysis for the following 9 genes:  ATM, BRCA1, BRCA2, CDH1, CHEK2, PALB2, PTEN, STK11 and TP53.   The report date is June 25, 2017.  Negative genetic testing on the common hereditary cancer panel.  The Hereditary Gene Panel offered by [facility] includes sequencing and/or deletion duplication testing of the following 46 genes: APC, ATM, AXIN2, BARD1, BMPR1A, BRCA1, BRCA2, BRIP1, CDH1, CDKN2A (p14ARF), CDKN2A (p16INK4a), CHEK2, CTNNA1, DICER1, EPCAM (Deletion/duplication testing only), GREM1 (promoter region deletion/duplication testing only), KIT, MEN1, MLH1, MSH2, MSH3, MSH6, MUTYH, NBN, NF1, NHTL1, PALB2, PDGFRA, PMS2, POLD1, POLE, PTEN, RAD50, RAD51C, RAD51D, SDHB, SDHC, SDHD, SMAD4, SMARCA4. STK11, TP53, TSC1, TSC2, and VHL.  The following genes were evaluated for sequence changes only: SDHA and HOXB13 c.251G>A variant only.  The report date is June 25, 2017.       07/02/2017 Imaging    CT cAP 07/02/17 IMPRESSION: 1. Subareolar right breast mass. No compelling findings of nodal or metastatic involvement. 2. Old granulomatous disease. 3. There is a 7 mm enhancing or hyperdense focus posteriorly in segment 7 of the liver in the subcapsular region. By virtue of its small size this lesion is technically nonspecific although statistically likely to be a small benign lesions such as flash filling hemangioma. This may warrant surveillance. 4.  Prominent stool throughout  the colon favors constipation. 5. Degenerative disc disease and spondylosis at L5-S1 likely causing mild impingement.       07/02/2017 Imaging    Bone scan 07/02/17 IMPRESSION: Today' s exam is negative. I ascribed the tiny focus of activity just proximal to the left antecubital region to injection site.      07/22/2017 Surgery    RIGHT TOTAL MASTECTOMY WITH RIGHT AXILLARY SENTINEL LYMPH NODE BIOPSY, LEFT PROPHYLACTIC MASTECTOM and INSERTION PORT-A-CATH WITH [name] by [name] and [facility]       07/22/2017 Pathology Results    Diagnosis 1. Breast, simple mastectomy, Left - FIBROADENOMA. - NO MALIGNANCY IDENTIFIED. 2. 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 - ADDITIONAL FOCUS OF LOBULAR CARCINOMA, GRADE 2, SPANNING 2.2 CM. - INTERMEDIATE GRADE DUCTAL CARCINOMA IN SITU. - INVASIVE CARCINOMA COMES TO WITHIN 0.2 TO 0.3 CM OF THE DEEP MARGIN, FOCALLY. - TUMOR FOCALLY INVOLVES EPIDERMIS. - LYMPHOVASCULAR INVASION OF DERMAL LYMPHATICS. - ONE OF ONE LYMPH NODES NEGATIVE FOR CARCINOMA (0/1). - SEE ONCOLOGY TABLE. 3. Lymph node, sentinel, biopsy, Right axillary - ONE LYMPH NODE WITH ISOLATED TUMOR CELLS (0/1). 4. Lymph node, sentinel, biopsy, Right - METASTATIC CARCINOMA IN ONE OF ONE LYMPH NODES (1/1). 5. Lymph node, sentinel, biopsy, Right - ONE OF ONE LYMPH NODES NEGATIVE FOR CARCINOMA (0/1). 6. Lymph node, sentinel, biopsy, Right - METASTATIC CARCINOMA IN ONE OF ONE LYMPH NODES (1/1). - EXTRACAPSULAR EXTENSION. 7. Lymph node, sentinel, biopsy, Right - METASTATIC CARCINOMA IN ONE OF ONE LYMPH NODES (1/1). - EXTRACAPSULAR EXTENSION. 8. Lymph node, sentinel, biopsy, Right - ONE OF ONE LYMPH NODES NEGATIVE FOR CARCINOMA (0/1). 9. Lymph node, sentinel, biopsy, Right - ONE OF ONE LYMPH NODES NEGATIVE FOR CARCINOMA (0/1). 10. Lymph node, sentinel, biopsy, Right - METASTATIC CARCINOMA IN ONE OF ONE LYMPH NODES (1/1). - EXTRACAPSULAR EXTENSION. 11. Lymph node, biopsy, Right axillary - ONE OF ONE LYMPH NODES NEGATIVE FOR CARCINOMA (0/1).        07/22/2017 Receptors her2    Ductal carcinoma in primary breast tumor and node metastasis are ER 95%, PR 95% strongly positive, and HER2 + The lobular component in primary breast tumor is ER 95% positive, PR 95% positive, strong staining, HER-2 negative.       08/25/2017 Imaging    MRI abdomen done at [facility]:  Enhancing structure measing 5 mm is compatible with a flash filling hemangioma. No specific findings to suggest metastatic cancer      08/25/2017 Imaging    MRI Abdomen at [facility] 08/25/17  IMPRESSION: 1. Lesion in segment 7 of the liver has signal and enhancement characteristics compatible with a flash filling hemangioma. No specific findings identified to suggest metastatic disease.      08/29/2017 -  Chemotherapy    adjuvant TCHP every 3 weeks for 6 cycles starting 08/29/17, followed by maintenance Herceptin with or without Perjeta for 6-12 months. Postponed and decreased Carbo to 610m and added Onpro with cycle 2 on 09/19/17 due to neutropenic fever. Carbplatin further reduced to 509mstarting with cycle 3 due to thrombocytopenia.          09/04/2017 - 09/09/2017 Hospital Admission    Admit date: 09/04/2017 Discharge date: 09/09/2017  DISCHARGE DIAGNOSES:  Principal Problem:   [facility]:   Malignant neoplasm of upper-outer quadrant of right breast in female, estrogen receptor positive ([facility]  Port-A-Cath in place   Neutropenic fever ([facility]  Hyponatremia   Malignant neoplasm of  breast in female, estrogen receptor positive ([place])       HISTORY OF PRESENTING ILLNESS: [birth year]/19/28 Sheila [name] 41 y.o. female is here because of newly diagnosed Malignant neoplasm of upper-outer quadrant of right breast. She presents to the breast clinic today with her common law husband. She felt the lump initially when she had her physical 2 weeks ago, she had felt her nipples retracted several years ago. A few months ago it has become more pronounced. She had no pain or  discharge.   In the past was diagnosed with Vitamin D deficiency and high cholesterol. She previously was on mood stabilizing medication. She feel right now she does not need that medication but is overwhelmed. She donated eggs and previously had kidney stones removed. Her mother had breast cancer and was diagnosed at 58 along with paternal grandmother in her 55's.   Today she has heart burn which she is taking Prilosec and will see her GI later today. She has trouble sleeping and uses tylenol pm. She tried [place] which did not help, same with melatonin. Her common law husband Is on disability.  GYN HISTORY  Menarchal: 19 LMP: July 23rd, 2018 Contraceptive: 20-29 on birth control pill HRT: NA G0P0: does not want kids  CURRENT THERAPY:  adjuvant chemotherapy TCHP every 3 weeks for 6 cycles beginning 08/29/2017. Decrease Carbo to 683m and added Onpro with cycle 2 on 09/19/17 due to neutropenic fever. Carboplatin further reduced to AUC 4.5 starting with cycle 3 due to thrombocytopenia.    INTERVAL HISTORY:  AIsabel Ardilais here for a follow up and cycle 4 TCHP. She presents to the clinic today accompanied by her husband.  She notes she had nose bleeds after 2 hour fluids and a week after last cycle chemo. This was due to her thrombocytopenia. She notes she has used humidifier as to not effect her vessel from dry blood. She denies any other bleeding or fever. With reduced dose she feels her treatments have been more tolerable. This cycle 3 was her best cycle.  Gained 9 pounds since last cycle. She would like another letter to appeal her denial.     MEDICAL HISTORY:  Past Medical History:  Diagnosis Date  . Anxiety   . Cancer ([facility]    right breast   . Family history of breast cancer   . Family history of colon cancer   . GERD (gastroesophageal reflux disease)   . Headache    couple of migraines in the past  . History of kidney stones   . Vitamin D deficiency    SURGICAL  HISTORY: Past Surgical History:  Procedure Laterality Date  . egg donation    . kidney stone removal    . MASTECTOMY W/ SENTINEL NODE BIOPSY Bilateral 07/22/2017   Procedure: RIGHT TOTAL MASTECTOMY WITH RIGHT AXILLARY SENTINEL LYMPH NODE BIOPSY, LEFT PROPHYLACTIC MASTECTOMY;  Surgeon: WRolm Bookbinder MD;  [place] [facility]  Service: General;  Laterality: Bilateral;  . PORTACATH PLACEMENT Right 07/22/2017   Procedure: INSERTION PORT-A-CATH WITH [place]  Surgeon: WRolm Bookbinder MD;  [place] [facility] [place]  Service: General;  Laterality: Right;   SOCIAL HISTORY: Social History   Socioeconomic History  . Marital status: Single    Spouse name: Not on file  . Number of children: Not on file  . Years of education: Not on file  . Highest education level: Not on file  Social Needs  . Financial resource strain: Not on file  . Food insecurity - worry:  Not on file  . Food insecurity - inability: Not on file  . Transportation needs - medical: Not on file  . Transportation needs - non-medical: Not on file  Occupational History  . Not on file  Tobacco Use  . Smoking status: Never Smoker  . Smokeless tobacco: Former Network engineer and Sexual Activity  . Alcohol use: Yes    Comment: once a year  . Drug use: No  . Sexual activity: Yes  Other Topics Concern  . Not on file  Social History Narrative  . Not on file   FAMILY HISTORY: Family History  Problem Relation Age of Onset  . Breast cancer Paternal Grandmother 82  . Colon cancer Paternal Grandmother 52  . Breast cancer Mother 68  . Prostate cancer Paternal Grandfather        dx in his 68s  . Leukemia Paternal Grandfather   . Lung cancer Maternal Uncle        heavy smoker  . Other Paternal Uncle        farm accident  . Stroke Maternal Grandfather    ALLERGIES:  has No Known Allergies.  MEDICATIONS:  Current Outpatient Medications  Medication Sig Dispense Refill  . calcium carbonate (TUMS - DOSED IN MG ELEMENTAL CALCIUM) 500 MG  chewable tablet Chew 2 tablets by mouth as needed for indigestion or heartburn.     . Cholecalciferol (VITAMIN D) 2000 units tablet Take 2,000 Units by mouth daily.    Marland Kitchen dexamethasone (DECADRON) 4 MG tablet Take 2 tablets (8 mg total) by mouth 2 (two) times daily. Start the day before Taxotere. Then again the day after chemo for 3 days. 30 tablet 1  . fluticasone (FLONASE) 50 MCG/ACT nasal spray Place 1 spray into both nostrils daily.    Marland Kitchen lidocaine-prilocaine (EMLA) cream Apply to affected area once 30 g 3  . loperamide (IMODIUM) 2 MG capsule Take 1 capsule (2 mg total) by mouth 3 (three) times daily as needed for diarrhea or loose stools. 30 capsule 0  . potassium chloride (K-DUR) 10 MEQ tablet Take 2 tablets (20 mEq total) daily by mouth. Take 2 tabs twice daily for 5 days then take 2 tablets daily (Patient taking differently: Take 20 mEq by mouth every other day. ) 60 tablet 1  . prochlorperazine (COMPAZINE) 10 MG tablet Take 1 tablet (10 mg total) by mouth every 6 (six) hours as needed (Nausea or vomiting). 30 tablet 1  . promethazine (PHENERGAN) 12.5 MG tablet Take 1 tablet (12.5 mg total) every 6 (six) hours as needed by mouth for nausea or vomiting. 30 tablet 2  . diphenoxylate-atropine (LOMOTIL) 2.5-0.025 MG tablet Take 1-2 tablets by mouth 4 (four) times daily as needed for diarrhea or loose stools. (Patient not taking: Reported on 10/31/2017) 30 tablet 2  . mirtazapine (REMERON) 30 MG tablet Take 1 tablet (30 mg total) by mouth at bedtime. 30 tablet 2  . naproxen sodium (ANAPROX) 220 MG tablet Take 220 mg 2 (two) times daily as needed by mouth (pain).     Marland Kitchen omeprazole (PRILOSEC) 20 MG capsule Take 20 mg by mouth as needed.      Current Facility-Administered Medications  Medication Dose Route Frequency Provider Last Rate Last Dose  . gi cocktail (Maalox,Lidocaine,Donnatal)  30 mL Oral Once Harle Stanford., PA-C       Facility-Administered Medications Ordered in Other Visits  Medication  Dose Route Frequency Provider Last Rate Last Dose  . sodium chloride flush (NS) 0.9 %  injection 10 mL  10 mL Intracatheter PRN [name], MD   10 mL at 10/31/17 1644   REVIEW OF SYSTEMS:  Constitutional: Denies fevers, chills or abnormal night sweats  (+) weight gain, 9 pounds since last cycle  Eyes: Denies blurriness of vision, double vision or watery eyes Ears, nose, mouth, throat, and face: Denies mucositis or sore throat (+) occasional epistaxis Respiratory: Denies cough, dyspnea or wheezes Cardiovascular: Denies palpitation, chest discomfort or lower extremity swelling Gastrointestinal:  Denies nausea, heartburn or change in bowel habits   Skin: Denies abnormal skin rashes Lymphatics: Denies new lymphadenopathy or easy bruising Neurological:Denies numbness, tingling or new weaknesses Behavioral/Psych: Mood is stable, no new changes  Breast: (+) bilateral mastectomy on 07/22/17 All other systems were reviewed with the patient and are negative.  PHYSICAL EXAMINATION: ECOG PERFORMANCE STATUS: 1 - Symptomatic but completely ambulatory  Vitals:   10/31/17 1025  BP: 114/67  Pulse: (!) 118  Resp: 20  Temp: 98.4 F (36.9 C)  SpO2: 100%   Filed Weights   10/31/17 1025  Weight: 169 lb 9.6 oz (76.9 kg)      GENERAL:alert, no distress and comfortable SKIN: skin color, texture, turgor are normal, no rashes or significant lesions EYES: normal, conjunctiva are pink and non-injected, sclera clear OROPHARYNX:no exudate, no erythema and lips, buccal mucosa, and tongue normal  NECK: supple, thyroid normal size, non-tender, without nodularity LYMPH:  no palpable lymphadenopathy in the cervical, axillary or inguinal LUNGS: clear to auscultation and percussion with normal breathing effort HEART: regular rate & rhythm and no murmurs and no lower extremity edema ABDOMEN:abdomen soft, non-tender and normal bowel sounds Musculoskeletal:no cyanosis of digits and no clubbing  PSYCH: alert &  oriented x 3 with fluent speech NEURO: no focal motor/sensory deficits Breast: (+)bilateral mastectomy, incisions clean and intact, healing well. Bilateral drains present.   LABORATORY DATA:  I have reviewed the data as listed CBC Latest Ref Rng & Units 10/31/2017 10/17/2017 10/10/2017  WBC 3.9 - 10.3 10e3/uL 8.4 6.2 8.8  Hemoglobin 11.6 - 15.9 g/dL 9.2(L) 10.3(L) 9.9(L)  Hematocrit 34.8 - 46.6 % 29.5(L) 31.2(L) 30.0(L)  Platelets 145 - 400 10e3/uL 189 140(L) 116(L)   CMP Latest Ref Rng & Units 10/31/2017 10/17/2017 10/10/2017  Glucose 70 - 140 mg/dl 175(H) 126 147(H)  BUN 7.0 - 26.0 mg/dL 10.8 9.6 13.0  Creatinine 0.6 - 1.1 mg/dL 0.8 0.9 1.0  Sodium 136 - 145 mEq/L 140 137 139  Potassium 3.5 - 5.1 mEq/L 4.0 3.5 4.5  Chloride 101 - 111 mmol/L - - -  CO2 22 - 29 mEq/L 20(L) 27 21(L)  Calcium 8.4 - 10.4 mg/dL 8.9 8.7 9.7  Total Protein 6.4 - 8.3 g/dL 6.4 6.6 6.9  Total Bilirubin 0.20 - 1.20 mg/dL 0.31 0.42 0.48  Alkaline Phos 40 - 150 U/L 62 78 72  AST 5 - 34 U/L 21 14 39(H)  ALT 0 - 55 U/L 34 22 88(H)   PATHOLOGY  Diagnosis 07/22/17 1. Breast, simple mastectomy, Left - FIBROADENOMA. - NO MALIGNANCY IDENTIFIED. 2. 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 - ADDITIONAL FOCUS OF LOBULAR CARCINOMA, GRADE 2, SPANNING 2.2 CM. - INTERMEDIATE GRADE DUCTAL CARCINOMA IN SITU. - INVASIVE CARCINOMA COMES TO WITHIN 0.2 TO 0.3 CM OF THE DEEP MARGIN, FOCALLY. - TUMOR FOCALLY INVOLVES EPIDERMIS. - LYMPHOVASCULAR INVASION OF DERMAL LYMPHATICS. - ONE OF ONE LYMPH NODES NEGATIVE FOR CARCINOMA (0/1). - SEE ONCOLOGY TABLE. 3. Lymph node, sentinel,  biopsy, Right axillary - ONE LYMPH NODE WITH ISOLATED TUMOR CELLS (0/1). 4. Lymph node, sentinel, biopsy, Right - METASTATIC CARCINOMA IN ONE OF ONE LYMPH NODES (1/1). 5. Lymph node, sentinel, biopsy, Right - ONE OF ONE LYMPH NODES NEGATIVE FOR CARCINOMA (0/1). 6. Lymph node, sentinel,  biopsy, Right - METASTATIC CARCINOMA IN ONE OF ONE LYMPH NODES (1/1). - EXTRACAPSULAR EXTENSION. 7. Lymph node, sentinel, biopsy, Right - METASTATIC CARCINOMA IN ONE OF ONE LYMPH NODES (1/1). - EXTRACAPSULAR EXTENSION. 8. Lymph node, sentinel, biopsy, Right - ONE OF ONE LYMPH NODES NEGATIVE FOR CARCINOMA (0/1). 9. Lymph node, sentinel, biopsy, Right - ONE OF ONE LYMPH NODES NEGATIVE FOR CARCINOMA (0/1). 10. Lymph node, sentinel, biopsy, Right - METASTATIC CARCINOMA IN ONE OF ONE LYMPH NODES (1/1). - EXTRACAPSULAR EXTENSION. 11. Lymph node, biopsy, Right axillary - ONE OF ONE LYMPH NODES NEGATIVE FOR CARCINOMA (0/1). Microscopic Comment 2. BREAST, INVASIVE TUMOR Microscopic Comment(continued) Procedure: Bilateral simple mastectomies with right axillary sentinel lymph node biopsies. Laterality: Right. Tumor Size: 5.3 cm. Histologic Type: Mixed lobular and ductal carcinoma. Grade: 2 Tubular Differentiation: 2 Nuclear Pleomorphism: 3 Mitotic Count: 1 Ductal Carcinoma in Situ (DCIS): Present, intermediate grade. Extent of Tumor: Involves epidermis of nipple focally. Margins: Invasive carcinoma, distance from closest margin: 0.2-0.3 cm of posterior margin (smaller mass). DCIS, distance from closest margin: >0.5 cm all margins Regional Lymph Nodes: Number of Lymph Nodes Examined: 10 Number of Sentinel Lymph Nodes Examined: 9 Lymph Nodes with Macrometastases: 4 Lymph Nodes with Micrometastases: 0 Lymph Nodes with Isolated Tumor Cells: 1 Breast Prognostic Profile: Performed on biopsy ([id number]), see below. Will be performed on additional lobular focus. Estrogen Receptor: Positive, 100% strong staining. Progesterone Receptor: Positive, 100% strong staining. Her2: Positive (ratio 2.55). Ki-67: 12%. Best tumor block for sendout testing: 2B (larger focus), 2E (smaller focus). Pathologic Stage Classification (pTNM, AJCC 8th Edition): Primary Tumor (pT): mpT3 Regional Lymph Nodes  (pN): pN2a Distant Metastases (pM): pMX 2. FLUORESCENCE IN-SITU HYBRIDIZATION Results: HER2 - NEGATIVE RATIO OF HER2/CEP17 SIGNALS 1.71 AVERAGE HER2 COPY NUMBER PER CELL 1.80 2. PROGNOSTIC INDICATORS Results: IMMUNOHISTOCHEMICAL AND MORPHOMETRIC ANALYSIS PERFORMED MANUALLY Estrogen Receptor: 95%, POSITIVE, STRONG STAINING INTENSITY Progesterone Receptor: 95%, POSITIVE, STRONG STAINING INTENSITY Proliferation Marker Ki67: 10%  Diagnosis 06/11/17 Breast, right, needle core biopsy, 11:00 o'clock - INVASIVE DUCTAL CARCINOMA, SEE COMMENT. Microscopic Comment The carcinoma appears grade 2. Prognostic markers will be ordered. Dr. Lyndon Code has reviewed the case. The case was called to [facility] on 06/12/2017. Results: HER2 - **POSITIVE** RATIO OF HER2/CEP17 SIGNALS 2.55 AVERAGE HER2 COPY NUMBER PER CELL 4.85  PROCEDURES  ECHO expected 08/12/17  RADIOGRAPHIC STUDIES: I have personally reviewed the radiological images as listed and agreed with the findings in the report. No results found. MRI Abdomen at [facility] 08/25/17  IMPRESSION: 1. Lesion in segment 7 of the liver has signal and enhancement characteristics compatible with a flash filling hemangioma. No specific findings identified to suggest metastatic disease.    ASSESSMENT & PLAN:  Marijo Quizon is a 41 y.o. premenopausal female with a history of Vitamin D deficiency and high cholesterol and kidney stones, presented with a palpable right breast mass.   1. Malignant neoplasm of upper-outer quadrant of right breast in female, mixed invasive ductal and lobular carcinoma, pT3(m)N2aM0, stage 1b, ductal carcinoma triple positive, lobular carcinoma ER+/PR+/HER2-,  Grade 2 -She underwent staging CT and bone scan on 07/02/2017 prior to surgery, both were negative for metastasis. -There was a 67m enhancing or hyperdense focus posteriorly in  the liver which is technically nonspecific, and her abdominal MRI showed this  lesion is likely hemangioma. -I reviewed the surgical pathology with the patient and her husband in detail.  -Final diagnosis from the right simple mastectomy and sentinel lymph node biopsy on 07/22/2017 revealed mixed invasive lobular and ductal carcinoma, grade 2, spanning 5.3 cm. There is an additional focus of lobular carcinoma, grade 2, spanning 2.2cm and it is ER+/PR+/HER2-. 4 of 10 lymph nodes tested positive for macrometastases and they are HER2+ ductal carcinoma.  -I also discussed the possibility of oral anti-HER2 therapy after she completes Herceptin maintenance therapy. -We reviewed the risk of cancer recurrence after complete surgical resection. Giving the large size of tumor, multiple positive lymph nodes, HER-2 positive disease, she has very high risk for recurrence. I strongly recommend adjuvant chemotherapy to reduce her risk of recurrence.  -She initially preferred reconstruction. She saw Dr. Iran Planas but ultimately did not opt for reconstruction.  -Given her locally advanced disease, I recommend adjuvant docetaxel, carboplatin, Herceptin and perjeta (TCHP) every 3 weeks for 6 cycles, followed by maintenance Herceptin with Perjeta for 12 months.  I discussed the additional benefit of pejeta to Herceptin in adjuvant setting, given her locally advanced disease, if her insurance approves, I will continue Herceptin and pejeta as maintenance therapy for a total of 1 year. -Given her ER/PR positive tumor, I recommend adjuvant antiestrogen therapy with tamoxifen, starting after her adjuvant irradiation. Benefit and the side effects reviewed with her, she is interested. Given her young age, ovarian suppression and AI would be likely improve her cancer survival, we have discussed and she will think about it.  -PAC placed during surgery, Chest xray confirms tip placement in the SVC. -Baseline echo was normal, she saw Dr. Aundra Dubin on 08/12/2017 -She started [facility] on 08/29/17, developed neutropenic fever  and diarrhea afterwards and was hospitalized. -Due to her neutropenic fever after first cycle we will decrease carboplatin dose starting with cycle 2 on 09/19/17 and will add Neulasta.  -We again discussed the management of nausea and diarrhea. -I encourage her watch her mouth for thrush as it can lead to infection. I advised her to keep mouth clean with biotin mouth wash.  -10/10/17 labs showed thrombocytopenia with plt at 119K and continued anemia with no need for transfusion, elevated transaminases, all likely secondary to chemo.  -Carboplatin further reduced to AUC 4.5 due to thrombocytopenia starting with cycle 3.  -She received IV Fluids on 10/17/17 due to suspected dehydration.  -She has tolerated cycle 3 well overall, will continue current dose  -Labs reviewed, Kidney/liver function normal, ANC 6.9, Hg dropped slightly to 9.2, but no transfusion needed. Overall labs adequate to proceed with cycle 4 today.  -I discussed after 6th cycle chemo I will refer her to rad onc for radiation. She will continue Herceptin and Perjeta if approved by her insurance.  -She will continue with ECHO every 3-6 months until she is done with Herceptin.  -F/u on 12/21 with IVF for supportive care   2. Genetics -Due to her family history of breast cancer, she agreed to a genetics referral -She was negative for all mutations tested   3. Hyperglycemia -she has no personal history of diabetes -we discussed BG can increase during chemotherapy especially in the setting of steroid premeds -she can monitor at home with her husband's glucometer, she will notify us if consistently elevated or over 140 postprandial -BG 151 on 08/28/17 -She is on dexamethasone so I suggest her to closely monitor  her BG at home and if high we can reduce her dose. Decrease dexamethasone to 1 tab in the AM and 2 tabs in the PM for 3 days after each treatment due to her hyperglycemia. She can take 1 additional pill as needed for her  nausea. -Her sugar remains elevated at 175 today (10/31/17). Given her mild nausea and no sign of allergy reactions so far, we will dexa to 11m bid for day 2 and 4 mg daily for day 3-4  4. 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  -Provided another letter today (10/31/17)  5. Febrile Neutropenia  -on 09/04/17 pt presented with neutropenic fever, tachycardia, tachypnea, hypotension, and lethargy. Tmax 100.8 in clinic.  -She was transported to ED for code sepsis and was treated with antibiotics  -Neulasta was denied by her insurance, she received Granix after first cycle chemo, we will give Neulasta after cycle 2 chemotherapy. -Added Onpro starting with cycle 2 and neutropenia     6. Nausea/Diarrhea -I explained to use imodium at the first sign of diarrhea, up to 8. If diarrhea persist I recommend lomotil which I prescribed on 09/19/17.  -I suggest she uses Phenergan or compazine for antiemetics, I encouraged her not to take at the same time.   7. Hypokalemia  -10/02/17 labs showed potassium at 2.9. She will get 20 mEq in 1 L NS today over 2 hours, will start 40 mEq PO x5 days then reduce to 20 mEq daily until next visit.  -Continue oral potassium for now due to diarrhea -She received IV fluids due to suspected dehydration on 10/10/17 -potassium normal on 10/31/17 to 4.0.    8. Insomnia  -She has history of insomnia failed multiple agents such as lunesta, trazodone, tylenol pm, and xanax. -She was prescribed Remeron for her to try on 10/10/17. I explained this may also help with her anxiety related to treatment and side effects. -Remeron has helped mildly. She would like to  increase dose to 344m I called in on 10/31/17  9. Nosebleeds  -She developed heavy nosebleeds x2 after cycle 3 chemo, plt 140K on 10/17/17 but likely to have been lower this week. She does not require platelet transfusion -It was recommend to avoid NSAIDs, blowing nose and may use cool mist humidifier to moisten air at home -She will continue to use humidifying, will monitor.    PLAN:  -Refill Remeron and increase to 3032maily.  -Provided another letter for her insurance company to approve her treatment  -Labs reviewed and adequate to proceed with TCHP cycle 4 today, continue carbo AUC 4.5  -lab, flush, f/u and ivf in one week, next cycle chemo in 3 weeks    All questions were answered. The patient knows to call the clinic with any problems, questions or concerns. I spent 30 minutes counseling the patient face to face. The total time spent in the appointment was 40 minutes and more than 50% was on counseling.   FenTruitt Merle2/14/2018   This document serves as a record of services personally performed by YanTruitt MerleD. It was created on her behalf by AmoJoslyn Devon trained medical scribe. The creation of this record  is based on the scribe's personal observations and the provider's statements to them.    I have reviewed the above documentation for accuracy and completeness, and I agree with the above.

## 2017-10-31 ENCOUNTER — Encounter: Payer: Self-pay | Admitting: Hematology

## 2017-10-31 ENCOUNTER — Ambulatory Visit: Payer: PRIVATE HEALTH INSURANCE

## 2017-10-31 ENCOUNTER — Other Ambulatory Visit (HOSPITAL_BASED_OUTPATIENT_CLINIC_OR_DEPARTMENT_OTHER): Payer: PRIVATE HEALTH INSURANCE

## 2017-10-31 ENCOUNTER — Ambulatory Visit (HOSPITAL_BASED_OUTPATIENT_CLINIC_OR_DEPARTMENT_OTHER): Payer: PRIVATE HEALTH INSURANCE | Admitting: Hematology

## 2017-10-31 ENCOUNTER — Ambulatory Visit (HOSPITAL_BASED_OUTPATIENT_CLINIC_OR_DEPARTMENT_OTHER): Payer: PRIVATE HEALTH INSURANCE

## 2017-10-31 VITALS — BP 114/67 | HR 118 | Temp 98.4°F | Resp 20 | Ht 66.0 in | Wt 169.6 lb

## 2017-10-31 VITALS — HR 96

## 2017-10-31 DIAGNOSIS — C50411 Malignant neoplasm of upper-outer quadrant of right female breast: Secondary | ICD-10-CM

## 2017-10-31 DIAGNOSIS — Z170 Estrogen receptor positive status [ER+]: Principal | ICD-10-CM

## 2017-10-31 DIAGNOSIS — E876 Hypokalemia: Secondary | ICD-10-CM | POA: Diagnosis not present

## 2017-10-31 DIAGNOSIS — Z5112 Encounter for antineoplastic immunotherapy: Secondary | ICD-10-CM | POA: Diagnosis not present

## 2017-10-31 DIAGNOSIS — Z5189 Encounter for other specified aftercare: Secondary | ICD-10-CM | POA: Diagnosis not present

## 2017-10-31 DIAGNOSIS — Z5111 Encounter for antineoplastic chemotherapy: Secondary | ICD-10-CM | POA: Diagnosis not present

## 2017-10-31 DIAGNOSIS — Z95828 Presence of other vascular implants and grafts: Secondary | ICD-10-CM

## 2017-10-31 LAB — CBC WITH DIFFERENTIAL/PLATELET
BASO%: 0.1 % (ref 0.0–2.0)
Basophils Absolute: 0 10*3/uL (ref 0.0–0.1)
EOS%: 0 % (ref 0.0–7.0)
Eosinophils Absolute: 0 10*3/uL (ref 0.0–0.5)
HCT: 29.5 % — ABNORMAL LOW (ref 34.8–46.6)
HGB: 9.2 g/dL — ABNORMAL LOW (ref 11.6–15.9)
LYMPH%: 11.8 % — AB (ref 14.0–49.7)
MCH: 30.4 pg (ref 25.1–34.0)
MCHC: 31.2 g/dL — AB (ref 31.5–36.0)
MCV: 97.4 fL (ref 79.5–101.0)
MONO#: 0.5 10*3/uL (ref 0.1–0.9)
MONO%: 5.5 % (ref 0.0–14.0)
NEUT%: 82.6 % — ABNORMAL HIGH (ref 38.4–76.8)
NEUTROS ABS: 6.9 10*3/uL — AB (ref 1.5–6.5)
NRBC: 0 % (ref 0–0)
Platelets: 189 10*3/uL (ref 145–400)
RBC: 3.03 10*6/uL — AB (ref 3.70–5.45)
RDW: 17.2 % — AB (ref 11.2–14.5)
WBC: 8.4 10*3/uL (ref 3.9–10.3)
lymph#: 1 10*3/uL (ref 0.9–3.3)

## 2017-10-31 LAB — COMPREHENSIVE METABOLIC PANEL
ALT: 34 U/L (ref 0–55)
ANION GAP: 12 meq/L — AB (ref 3–11)
AST: 21 U/L (ref 5–34)
Albumin: 3.4 g/dL — ABNORMAL LOW (ref 3.5–5.0)
Alkaline Phosphatase: 62 U/L (ref 40–150)
BUN: 10.8 mg/dL (ref 7.0–26.0)
CHLORIDE: 109 meq/L (ref 98–109)
CO2: 20 meq/L — AB (ref 22–29)
CREATININE: 0.8 mg/dL (ref 0.6–1.1)
Calcium: 8.9 mg/dL (ref 8.4–10.4)
EGFR: >60 mL/min/{1.73_m2} (ref >60)
GLUCOSE: 175 mg/dL — AB (ref 70–140)
Potassium: 4 mEq/L (ref 3.5–5.1)
SODIUM: 140 meq/L (ref 136–145)
Total Bilirubin: 0.31 mg/dL (ref 0.20–1.20)
Total Protein: 6.4 g/dL (ref 6.4–8.3)

## 2017-10-31 MED ORDER — HEPARIN SOD (PORK) LOCK FLUSH 100 UNIT/ML IV SOLN
500.0000 [IU] | Freq: Once | INTRAVENOUS | Status: AC | PRN
  Administered 2017-10-31: 500 [IU]
  Filled 2017-10-31: qty 5

## 2017-10-31 MED ORDER — PEGFILGRASTIM 6 MG/0.6ML ~~LOC~~ PSKT
6.0000 mg | PREFILLED_SYRINGE | Freq: Once | SUBCUTANEOUS | Status: AC
  Administered 2017-10-31: 6 mg via SUBCUTANEOUS

## 2017-10-31 MED ORDER — MIRTAZAPINE 30 MG PO TABS: 30.0000 mg | ORAL_TABLET | Freq: Every day | ORAL | 2 refills | Status: DC

## 2017-10-31 MED ORDER — DIPHENHYDRAMINE HCL 25 MG PO CAPS
50.0000 mg | ORAL_CAPSULE | Freq: Once | ORAL | Status: AC
  Administered 2017-10-31: 50 mg via ORAL

## 2017-10-31 MED ORDER — ACETAMINOPHEN 325 MG PO TABS
650.0000 mg | ORAL_TABLET | Freq: Once | ORAL | Status: AC
  Administered 2017-10-31: 650 mg via ORAL

## 2017-10-31 MED ORDER — DEXAMETHASONE SODIUM PHOSPHATE 10 MG/ML IJ SOLN
10.0000 mg | Freq: Once | INTRAMUSCULAR | Status: AC
  Administered 2017-10-31: 10 mg via INTRAVENOUS

## 2017-10-31 MED ORDER — SODIUM CHLORIDE 0.9 % IV SOLN
Freq: Once | INTRAVENOUS | Status: AC
  Administered 2017-10-31: 11:00:00 via INTRAVENOUS

## 2017-10-31 MED ORDER — SODIUM CHLORIDE 0.9 % IV SOLN
601.6500 mg | Freq: Once | INTRAVENOUS | Status: AC
  Administered 2017-10-31: 600 mg via INTRAVENOUS
  Filled 2017-10-31: qty 60

## 2017-10-31 MED ORDER — PALONOSETRON HCL INJECTION 0.25 MG/5ML
0.2500 mg | Freq: Once | INTRAVENOUS | Status: AC
  Administered 2017-10-31: 0.25 mg via INTRAVENOUS

## 2017-10-31 MED ORDER — DEXAMETHASONE SODIUM PHOSPHATE 10 MG/ML IJ SOLN
INTRAMUSCULAR | Status: AC
  Filled 2017-10-31: qty 1

## 2017-10-31 MED ORDER — PALONOSETRON HCL INJECTION 0.25 MG/5ML
INTRAVENOUS | Status: AC
  Filled 2017-10-31: qty 5

## 2017-10-31 MED ORDER — SODIUM CHLORIDE 0.9% FLUSH
10.0000 mL | INTRAVENOUS | Status: DC | PRN
  Administered 2017-10-31: 10 mL
  Filled 2017-10-31: qty 10

## 2017-10-31 MED ORDER — TRASTUZUMAB CHEMO 150 MG IV SOLR
450.0000 mg | Freq: Once | INTRAVENOUS | Status: AC
  Administered 2017-10-31: 450 mg via INTRAVENOUS
  Filled 2017-10-31: qty 21.43

## 2017-10-31 MED ORDER — DOCETAXEL CHEMO INJECTION 160 MG/16ML
75.0000 mg/m2 | Freq: Once | INTRAVENOUS | Status: AC
  Administered 2017-10-31: 140 mg via INTRAVENOUS
  Filled 2017-10-31: qty 14

## 2017-10-31 MED ORDER — PERTUZUMAB CHEMO INJECTION 420 MG/14ML
420.0000 mg | Freq: Once | INTRAVENOUS | Status: AC
  Administered 2017-10-31: 420 mg via INTRAVENOUS
  Filled 2017-10-31: qty 14

## 2017-10-31 MED ORDER — SODIUM CHLORIDE 0.9% FLUSH
10.0000 mL | Freq: Once | INTRAVENOUS | Status: AC
  Administered 2017-10-31: 10 mL
  Filled 2017-10-31: qty 10

## 2017-10-31 MED ORDER — DIPHENHYDRAMINE HCL 25 MG PO CAPS
ORAL_CAPSULE | ORAL | Status: AC
  Filled 2017-10-31: qty 2

## 2017-10-31 MED ORDER — ACETAMINOPHEN 325 MG PO TABS
ORAL_TABLET | ORAL | Status: AC
  Filled 2017-10-31: qty 2

## 2017-10-31 NOTE — Patient Instructions (Signed)
[facility] Discharge Instructions for Patients Receiving Chemotherapy  Today you received the following chemotherapy agents: Herceptin, Perjeta, Taxotere, Carboplatin.  To help prevent nausea and vomiting after your treatment, we encourage you to take your nausea medication as prescribed. If you develop nausea and vomiting that is not controlled by your nausea medication, call the clinic.   BELOW ARE SYMPTOMS THAT SHOULD BE REPORTED IMMEDIATELY:  *FEVER GREATER THAN 100.5 F  *CHILLS WITH OR WITHOUT FEVER  NAUSEA AND VOMITING THAT IS NOT CONTROLLED WITH YOUR NAUSEA MEDICATION  *UNUSUAL SHORTNESS OF BREATH  *UNUSUAL BRUISING OR BLEEDING  TENDERNESS IN MOUTH AND THROAT WITH OR WITHOUT PRESENCE OF ULCERS  *URINARY PROBLEMS  *BOWEL PROBLEMS  UNUSUAL RASH Items with * indicate a potential emergency and should be followed up as soon as possible.  Feel free to call the clinic should you have any questions or concerns. The clinic phone number is [phone].  Please show the CHEMO ALERT CARD at check-in to the Emergency Department and triage nurse.   

## 2017-11-03 ENCOUNTER — Encounter: Payer: PRIVATE HEALTH INSURANCE | Admitting: Physical Therapy

## 2017-11-05 ENCOUNTER — Encounter: Payer: PRIVATE HEALTH INSURANCE | Admitting: Physical Therapy

## 2017-11-07 ENCOUNTER — Encounter: Payer: Self-pay | Admitting: Nurse Practitioner

## 2017-11-07 ENCOUNTER — Ambulatory Visit (HOSPITAL_BASED_OUTPATIENT_CLINIC_OR_DEPARTMENT_OTHER): Payer: PRIVATE HEALTH INSURANCE

## 2017-11-07 ENCOUNTER — Other Ambulatory Visit (HOSPITAL_BASED_OUTPATIENT_CLINIC_OR_DEPARTMENT_OTHER): Payer: PRIVATE HEALTH INSURANCE

## 2017-11-07 ENCOUNTER — Ambulatory Visit (HOSPITAL_BASED_OUTPATIENT_CLINIC_OR_DEPARTMENT_OTHER): Payer: PRIVATE HEALTH INSURANCE | Admitting: Nurse Practitioner

## 2017-11-07 ENCOUNTER — Ambulatory Visit: Payer: PRIVATE HEALTH INSURANCE

## 2017-11-07 VITALS — BP 108/68 | HR 129 | Temp 98.3°F | Resp 20 | Ht 66.0 in | Wt 168.9 lb

## 2017-11-07 VITALS — BP 103/64 | HR 117 | Resp 19

## 2017-11-07 DIAGNOSIS — C50411 Malignant neoplasm of upper-outer quadrant of right female breast: Secondary | ICD-10-CM | POA: Diagnosis not present

## 2017-11-07 DIAGNOSIS — E860 Dehydration: Secondary | ICD-10-CM

## 2017-11-07 DIAGNOSIS — Z170 Estrogen receptor positive status [ER+]: Secondary | ICD-10-CM | POA: Diagnosis not present

## 2017-11-07 DIAGNOSIS — D630 Anemia in neoplastic disease: Secondary | ICD-10-CM

## 2017-11-07 DIAGNOSIS — Z95828 Presence of other vascular implants and grafts: Secondary | ICD-10-CM

## 2017-11-07 DIAGNOSIS — R000 Tachycardia, unspecified: Secondary | ICD-10-CM | POA: Diagnosis not present

## 2017-11-07 LAB — COMPREHENSIVE METABOLIC PANEL
ALBUMIN: 3.4 g/dL — AB (ref 3.5–5.0)
ALT: 21 U/L (ref 0–55)
AST: 21 U/L (ref 5–34)
Alkaline Phosphatase: 68 U/L (ref 40–150)
Anion Gap: 9 mEq/L (ref 3–11)
BUN: 10.4 mg/dL (ref 7.0–26.0)
CHLORIDE: 101 meq/L (ref 98–109)
CO2: 26 mEq/L (ref 22–29)
CREATININE: 0.9 mg/dL (ref 0.6–1.1)
Calcium: 8.7 mg/dL (ref 8.4–10.4)
EGFR: >60 mL/min/{1.73_m2} (ref >60)
GLUCOSE: 127 mg/dL (ref 70–140)
POTASSIUM: 3.6 meq/L (ref 3.5–5.1)
SODIUM: 136 meq/L (ref 136–145)
Total Bilirubin: 0.43 mg/dL (ref 0.20–1.20)
Total Protein: 6 g/dL — ABNORMAL LOW (ref 6.4–8.3)

## 2017-11-07 LAB — CBC WITH DIFFERENTIAL/PLATELET
BASO%: 0.6 % (ref 0.0–2.0)
Basophils Absolute: 0 10*3/uL (ref 0.0–0.1)
EOS ABS: 0 10*3/uL (ref 0.0–0.5)
EOS%: 0.1 % (ref 0.0–7.0)
HCT: 28.1 % — ABNORMAL LOW (ref 34.8–46.6)
HGB: 9.3 g/dL — ABNORMAL LOW (ref 11.6–15.9)
LYMPH%: 28.1 % (ref 14.0–49.7)
MCH: 30.6 pg (ref 25.1–34.0)
MCHC: 33 g/dL (ref 31.5–36.0)
MCV: 92.9 fL (ref 79.5–101.0)
MONO#: 1 10*3/uL — AB (ref 0.1–0.9)
MONO%: 13.5 % (ref 0.0–14.0)
NEUT#: 4.4 10*3/uL (ref 1.5–6.5)
NEUT%: 57.7 % (ref 38.4–76.8)
PLATELETS: 164 10*3/uL (ref 145–400)
RBC: 3.03 10*6/uL — ABNORMAL LOW (ref 3.70–5.45)
RDW: 17.7 % — ABNORMAL HIGH (ref 11.2–14.5)
WBC: 7.6 10*3/uL (ref 3.9–10.3)
lymph#: 2.1 10*3/uL (ref 0.9–3.3)

## 2017-11-07 MED ORDER — SODIUM CHLORIDE 0.9 % IV SOLN
Freq: Once | INTRAVENOUS | Status: AC
  Administered 2017-11-07: 15:00:00 via INTRAVENOUS

## 2017-11-07 MED ORDER — SODIUM CHLORIDE 0.9% FLUSH
10.0000 mL | Freq: Once | INTRAVENOUS | Status: AC
  Administered 2017-11-07: 10 mL
  Filled 2017-11-07: qty 10

## 2017-11-07 NOTE — Progress Notes (Signed)
[place]  [phone] [phone]  Clinic Follow up Note   Patient Care Team: [name], MD as PCP - General (Family Medicine) [name] (Family Medicine) [name], MD as Consulting Physician (General Surgery) [name], MD as Consulting Physician (Hematology) Kyung Rudd, MD as Consulting Physician (Radiation Oncology) 11/07/2017  CHIEF COMPLAINT:  F/u R breast cancer   SUMMARY OF ONCOLOGIC HISTORY: Oncology History    Cancer Staging Malignant neoplasm of upper-outer quadrant of right breast in female, estrogen receptor positive ([place]) Staging form: Breast, AJCC 8th Edition - Clinical stage from 06/11/2017: Stage IB (cT3, cN0, cM0, G2, ER: Positive, PR: Positive, HER2: Positive) - Signed by [name], MD on 06/16/2017 - Pathologic stage from 07/22/2017: Stage IB (pT3(m), pN2a(sn), cM0, G2, ER: Positive, PR: Positive, HER2: Positive) - Signed by [name], NP on 08/04/2017      Malignant neoplasm of upper-outer quadrant of right breast in female, estrogen receptor positive ([place])   06/11/2017 Initial Biopsy    Diagnosis 06/11/17 Breast, right, needle core biopsy, 11:00 o'clock - INVASIVE DUCTAL CARCINOMA, G2      06/11/2017 Mammogram    [name] and [facility] Diagnostic Breast Tomo Bilateral 06/11/17 IMPRESSION: 1. Highly suspicious mass within the retroareolar right breast, extending from the 8:00 to 11:00 axes, anterior to posterior depth, extending anteriorly to the nipple with associated nipple retraction, measuring at least 5.5 cm by ultrasound  2. No sonographic evidence of metastatic lymphadenopathy in the right axilla. 3. No evidence of malignancy within the left breast.       06/11/2017 Initial Diagnosis    Malignant neoplasm of upper-outer quadrant of right breast in female, estrogen receptor positive ([place])      06/11/2017 Receptors her2    Estrogen Receptor: 100%, POSITIVE, STRONG STAINING  INTENSITY Progesterone Receptor: 100%, POSITIVE, STRONG STAINING INTENSITY Proliferation Marker Ki67: 12%Proliferation Marker Ki67: 12%  HER2 - **POSITIVE** RATIO OF HER2/CEP17 SIGNALS 2.55 AVERAGE HER2 COPY NUMBER PER CELL 4.85      06/25/2017 Genetic Testing    Negative genetic testing on the 9 gene STAT panel.  The STAT Breast cancer panel offered by [facility] includes sequencing and rearrangement analysis for the following 9 genes:  ATM, BRCA1, BRCA2, CDH1, CHEK2, PALB2, PTEN, STK11 and TP53.   The report date is June 25, 2017.  Negative genetic testing on the common hereditary cancer panel.  The Hereditary Gene Panel offered by [facility] includes sequencing and/or deletion duplication testing of the following 46 genes: APC, ATM, AXIN2, BARD1, BMPR1A, BRCA1, BRCA2, BRIP1, CDH1, CDKN2A (p14ARF), CDKN2A (p16INK4a), CHEK2, CTNNA1, DICER1, EPCAM (Deletion/duplication testing only), GREM1 (promoter region deletion/duplication testing only), KIT, MEN1, MLH1, MSH2, MSH3, MSH6, MUTYH, NBN, NF1, NHTL1, PALB2, PDGFRA, PMS2, POLD1, POLE, PTEN, RAD50, RAD51C, RAD51D, SDHB, SDHC, SDHD, SMAD4, SMARCA4. STK11, TP53, TSC1, TSC2, and VHL.  The following genes were evaluated for sequence changes only: SDHA and HOXB13 c.251G>A variant only.  The report date is June 25, 2017.       07/02/2017 Imaging    CT cAP 07/02/17 IMPRESSION: 1. Subareolar right breast mass. No compelling findings of nodal or metastatic involvement. 2. Old granulomatous disease. 3. There is a 7 mm enhancing or hyperdense focus posteriorly in segment 7 of the liver in the subcapsular region. By virtue of its small size this lesion is technically nonspecific although statistically likely to be a small benign lesions such as flash filling hemangioma. This may warrant surveillance. 4.  Prominent stool throughout the colon favors constipation.  5. Degenerative disc disease and spondylosis at L5-S1 likely causing mild impingement.       07/02/2017 Imaging    Bone scan 07/02/17 IMPRESSION: Today' s exam is negative. I ascribed the tiny focus of activity just proximal to the left antecubital region to injection site.      07/22/2017 Surgery    RIGHT TOTAL MASTECTOMY WITH RIGHT AXILLARY SENTINEL LYMPH NODE BIOPSY, LEFT PROPHYLACTIC MASTECTOM and INSERTION PORT-A-CATH WITH [name] by [name] and [facility]       07/22/2017 Pathology Results    Diagnosis 1. Breast, simple mastectomy, Left - FIBROADENOMA. - NO MALIGNANCY IDENTIFIED. 2. 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 - ADDITIONAL FOCUS OF LOBULAR CARCINOMA, GRADE 2, SPANNING 2.2 CM. - INTERMEDIATE GRADE DUCTAL CARCINOMA IN SITU. - INVASIVE CARCINOMA COMES TO WITHIN 0.2 TO 0.3 CM OF THE DEEP MARGIN, FOCALLY. - TUMOR FOCALLY INVOLVES EPIDERMIS. - LYMPHOVASCULAR INVASION OF DERMAL LYMPHATICS. - ONE OF ONE LYMPH NODES NEGATIVE FOR CARCINOMA (0/1). - SEE ONCOLOGY TABLE. 3. Lymph node, sentinel, biopsy, Right axillary - ONE LYMPH NODE WITH ISOLATED TUMOR CELLS (0/1). 4. Lymph node, sentinel, biopsy, Right - METASTATIC CARCINOMA IN ONE OF ONE LYMPH NODES (1/1). 5. Lymph node, sentinel, biopsy, Right - ONE OF ONE LYMPH NODES NEGATIVE FOR CARCINOMA (0/1). 6. Lymph node, sentinel, biopsy, Right - METASTATIC CARCINOMA IN ONE OF ONE LYMPH NODES (1/1). - EXTRACAPSULAR EXTENSION. 7. Lymph node, sentinel, biopsy, Right - METASTATIC CARCINOMA IN ONE OF ONE LYMPH NODES (1/1). - EXTRACAPSULAR EXTENSION. 8. Lymph node, sentinel, biopsy, Right - ONE OF ONE LYMPH NODES NEGATIVE FOR CARCINOMA (0/1). 9. Lymph node, sentinel, biopsy, Right - ONE OF ONE LYMPH NODES NEGATIVE FOR CARCINOMA (0/1). 10. Lymph node, sentinel, biopsy, Right - METASTATIC CARCINOMA IN ONE OF ONE LYMPH NODES (1/1). - EXTRACAPSULAR EXTENSION. 11. Lymph node, biopsy, Right axillary - ONE OF ONE LYMPH NODES NEGATIVE FOR CARCINOMA (0/1).        07/22/2017 Receptors her2    Ductal carcinoma in primary breast tumor and node metastasis are ER 95%, PR 95% strongly positive, and HER2 + The lobular component in primary breast tumor is ER 95% positive, PR 95% positive, strong staining, HER-2 negative.       08/25/2017 Imaging    MRI abdomen done at [facility]:  Enhancing structure measing 5 mm is compatible with a flash filling hemangioma. No specific findings to suggest metastatic cancer      08/25/2017 Imaging    MRI Abdomen at [facility] 08/25/17  IMPRESSION: 1. Lesion in segment 7 of the liver has signal and enhancement characteristics compatible with a flash filling hemangioma. No specific findings identified to suggest metastatic disease.      08/29/2017 -  Chemotherapy    adjuvant TCHP every 3 weeks for 6 cycles starting 08/29/17, followed by maintenance Herceptin with or without Perjeta for 6-12 months. Postponed and decreased Carbo to 670m and added Onpro with cycle 2 on 09/19/17 due to neutropenic fever. Carbplatin further reduced to 5021mstarting with cycle 3 due to thrombocytopenia.          09/04/2017 - 09/09/2017 Hospital Admission    Admit date: 09/04/2017 Discharge date: 09/09/2017  DISCHARGE DIAGNOSES:  Principal Problem:   Sepsis ([facility] Problems:   Malignant neoplasm of upper-outer quadrant of right breast in female, estrogen receptor positive ([facility]  Port-A-Cath in place   Neutropenic fever ([facility]  Hyponatremia   Malignant neoplasm of breast in female, estrogen  receptor positive ([place])      CURRENT THERAPY:  adjuvant chemotherapy TCHP every 3 weeks for 6 cycles beginning 08/29/2017. Decrease Carbo to 670m and added Onpro with cycle 2 on 09/19/17 due to neutropenic fever. Carboplatin further reduced to AUC 4.5 starting with cycle 3 due to thrombocytopenia.    INTERVAL HISTORY: Ms. LHerbstreturns for f/u as scheduled 1 week s/p cycle 4 TCHP with neulasta. Has moderate fatigue, continues activities but with  some effort. Mild nausea well controlled with compazine PRN. Had 1 episode of short-lived tingling to toes that is resolved. One day she had 2 episodes diarrhea that resolved with imodium. Minimal blood in nasal drainage improves with humidifier. She is drinking boost. Sleep has improved on Remeron. Good appetite; denies fever, chills, cough, chest pain, dyspnea, lower extremity swelling, vomiting, constipation, or moderate bone pain.   MEDICAL HISTORY:  Past Medical History:  Diagnosis Date  . Anxiety   . Cancer ([facility]    right breast   . Family history of breast cancer   . Family history of colon cancer   . GERD (gastroesophageal reflux disease)   . Headache    couple of migraines in the past  . History of kidney stones   . Vitamin D deficiency     SURGICAL HISTORY: Past Surgical History:  Procedure Laterality Date  . egg donation    . kidney stone removal    . MASTECTOMY W/ SENTINEL NODE BIOPSY Bilateral 07/22/2017   Procedure: RIGHT TOTAL MASTECTOMY WITH RIGHT AXILLARY SENTINEL LYMPH NODE BIOPSY, LEFT PROPHYLACTIC MASTECTOMY;  Surgeon: WRolm Bookbinder MD;  [facility]  Service: General;  Laterality: Bilateral;  . PORTACATH PLACEMENT Right 07/22/2017   Procedure: INSERTION PORT-A-CATH WITH [place]  Surgeon: WRolm Bookbinder MD;  [place] [facility]  Service: General;  Laterality: Right;    I have reviewed the social history and family history with the patient and they are unchanged from previous note.  ALLERGIES:  has No Known Allergies.  MEDICATIONS:  Current Outpatient Medications  Medication Sig Dispense Refill  . Cholecalciferol (VITAMIN D) 2000 units tablet Take 2,000 Units by mouth daily.    .Marland Kitchendexamethasone (DECADRON) 4 MG tablet Take 2 tablets (8 mg total) by mouth 2 (two) times daily. Start the day before Taxotere. Then again the day after chemo for 3 days. 30 tablet 1  . fluticasone (FLONASE) 50 MCG/ACT nasal spray Place 1 spray into both nostrils daily.    .Marland Kitchen lidocaine-prilocaine (EMLA) cream Apply to affected area once 30 g 3  . loperamide (IMODIUM) 2 MG capsule Take 1 capsule (2 mg total) by mouth 3 (three) times daily as needed for diarrhea or loose stools. 30 capsule 0  . mirtazapine (REMERON) 30 MG tablet Take 1 tablet (30 mg total) by mouth at bedtime. 30 tablet 2  . naproxen sodium (ANAPROX) 220 MG tablet Take 220 mg 2 (two) times daily as needed by mouth (pain).     . potassium chloride (K-DUR) 10 MEQ tablet Take 2 tablets (20 mEq total) daily by mouth. Take 2 tabs twice daily for 5 days then take 2 tablets daily (Patient taking differently: Take 20 mEq by mouth every other day. ) 60 tablet 1  . prochlorperazine (COMPAZINE) 10 MG tablet Take 1 tablet (10 mg total) by mouth every 6 (six) hours as needed (Nausea or vomiting). 30 tablet 1  . promethazine (PHENERGAN) 12.5 MG tablet Take 1 tablet (12.5 mg total) every 6 (six) hours as needed by mouth for  nausea or vomiting. 30 tablet 2  . calcium carbonate (TUMS - DOSED IN MG ELEMENTAL CALCIUM) 500 MG chewable tablet Chew 2 tablets by mouth as needed for indigestion or heartburn.     . diphenoxylate-atropine (LOMOTIL) 2.5-0.025 MG tablet Take 1-2 tablets by mouth 4 (four) times daily as needed for diarrhea or loose stools. (Patient not taking: Reported on 10/31/2017) 30 tablet 2  . omeprazole (PRILOSEC) 20 MG capsule Take 20 mg by mouth as needed.      Current Facility-Administered Medications  Medication Dose Route Frequency Provider Last Rate Last Dose  . gi cocktail (Maalox,Lidocaine,Donnatal)  30 mL Oral Once Harle Stanford., PA-C        PHYSICAL EXAMINATION: ECOG PERFORMANCE STATUS: 2 - Symptomatic, <50% confined to bed  Vitals:   11/07/17 1307 11/07/17 1310  BP: 108/68   Pulse: (!) 142 (!) 129  Resp: 20   Temp: 98.3 F (36.8 C)   SpO2: 100%    Filed Weights   11/07/17 1307  Weight: 168 lb 14.4 oz (76.6 kg)    GENERAL:alert, no distress and comfortable SKIN: skin color, texture,  turgor are normal, no rashes or significant lesions EYES: normal, Conjunctiva are pink and non-injected, sclera clear OROPHARYNX:no exudate, no erythema and lips, buccal mucosa, and tongue normal  NECK: supple, thyroid normal size, non-tender, without nodularity LYMPH:  no palpable cervical, supraclavicular, or axillary lymphadenopathy LUNGS: clear to auscultation bilaterally with normal breathing effort HEART: (+) tachycardic. Regular rhythm and no murmurs and no lower extremity edema ABDOMEN:abdomen soft, non-tender and normal bowel sounds. No palpable hepatomegaly Musculoskeletal:no cyanosis of digits and no clubbing  NEURO: alert & oriented x 3 with fluent speech, no focal motor/sensory deficits BREASTS: s/p bilateral mastectomy; incisions well healed.  PAC without erythema   LABORATORY DATA:  I have reviewed the data as listed CBC Latest Ref Rng & Units 11/07/2017 10/31/2017 10/17/2017  WBC 3.9 - 10.3 10e3/uL 7.6 8.4 6.2  Hemoglobin 11.6 - 15.9 g/dL 9.3(L) 9.2(L) 10.3(L)  Hematocrit 34.8 - 46.6 % 28.1(L) 29.5(L) 31.2(L)  Platelets 145 - 400 10e3/uL 164 189 140(L)     CMP Latest Ref Rng & Units 11/07/2017 10/31/2017 10/17/2017  Glucose 70 - 140 mg/dl 127 175(H) 126  BUN 7.0 - 26.0 mg/dL 10.4 10.8 9.6  Creatinine 0.6 - 1.1 mg/dL 0.9 0.8 0.9  Sodium 136 - 145 mEq/L 136 140 137  Potassium 3.5 - 5.1 mEq/L 3.6 4.0 3.5  Chloride 101 - 111 mmol/L - - -  CO2 22 - 29 mEq/L 26 20(L) 27  Calcium 8.4 - 10.4 mg/dL 8.7 8.9 8.7  Total Protein 6.4 - 8.3 g/dL 6.0(L) 6.4 6.6  Total Bilirubin 0.20 - 1.20 mg/dL 0.43 0.31 0.42  Alkaline Phos 40 - 150 U/L 68 62 78  AST 5 - 34 U/L '21 21 14  ' ALT 0 - 55 U/L 21 34 22   PATHOLOGY  Diagnosis 07/22/17 1. Breast, simple mastectomy, Left - FIBROADENOMA. - NO MALIGNANCY IDENTIFIED. 2. 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 - ADDITIONAL FOCUS OF LOBULAR CARCINOMA, GRADE 2, SPANNING  2.2 CM. - INTERMEDIATE GRADE DUCTAL CARCINOMA IN SITU. - INVASIVE CARCINOMA COMES TO WITHIN 0.2 TO 0.3 CM OF THE DEEP MARGIN, FOCALLY. - TUMOR FOCALLY INVOLVES EPIDERMIS. - LYMPHOVASCULAR INVASION OF DERMAL LYMPHATICS. - ONE OF ONE LYMPH NODES NEGATIVE FOR CARCINOMA (0/1). - SEE ONCOLOGY TABLE. 3. Lymph node, sentinel, biopsy, Right axillary - ONE LYMPH NODE WITH ISOLATED  TUMOR CELLS (0/1). 4. Lymph node, sentinel, biopsy, Right - METASTATIC CARCINOMA IN ONE OF ONE LYMPH NODES (1/1). 5. Lymph node, sentinel, biopsy, Right - ONE OF ONE LYMPH NODES NEGATIVE FOR CARCINOMA (0/1). 6. Lymph node, sentinel, biopsy, Right - METASTATIC CARCINOMA IN ONE OF ONE LYMPH NODES (1/1). - EXTRACAPSULAR EXTENSION. 7. Lymph node, sentinel, biopsy, Right - METASTATIC CARCINOMA IN ONE OF ONE LYMPH NODES (1/1). - EXTRACAPSULAR EXTENSION. 8. Lymph node, sentinel, biopsy, Right - ONE OF ONE LYMPH NODES NEGATIVE FOR CARCINOMA (0/1). 9. Lymph node, sentinel, biopsy, Right - ONE OF ONE LYMPH NODES NEGATIVE FOR CARCINOMA (0/1). 10. Lymph node, sentinel, biopsy, Right - METASTATIC CARCINOMA IN ONE OF ONE LYMPH NODES (1/1). - EXTRACAPSULAR EXTENSION. 11. Lymph node, biopsy, Right axillary - ONE OF ONE LYMPH NODES NEGATIVE FOR CARCINOMA (0/1). Microscopic Comment 2. BREAST, INVASIVE TUMOR Microscopic Comment(continued) Procedure: Bilateral simple mastectomies with right axillary sentinel lymph node biopsies. Laterality: Right. Tumor Size: 5.3 cm. Histologic Type: Mixed lobular and ductal carcinoma. Grade: 2 Tubular Differentiation: 2 Nuclear Pleomorphism: 3 Mitotic Count: 1 Ductal Carcinoma in Situ (DCIS): Present, intermediate grade. Extent of Tumor: Involves epidermis of nipple focally. Margins: Invasive carcinoma, distance from closest margin: 0.2-0.3 cm of posterior margin (smaller mass). DCIS, distance from closest margin: >0.5 cm all margins Regional Lymph Nodes: Number of Lymph Nodes Examined:  10 Number of Sentinel Lymph Nodes Examined: 9 Lymph Nodes with Macrometastases: 4 Lymph Nodes with Micrometastases: 0 Lymph Nodes with Isolated Tumor Cells: 1 Breast Prognostic Profile: Performed on biopsy ([id number]), see below. Will be performed on additional lobular focus. Estrogen Receptor: Positive, 100% strong staining. Progesterone Receptor: Positive, 100% strong staining. Her2: Positive (ratio 2.55). Ki-67: 12%. Best tumor block for sendout testing: 2B (larger focus), 2E (smaller focus). Pathologic Stage Classification (pTNM, AJCC 8th Edition): Primary Tumor (pT): mpT3 Regional Lymph Nodes (pN): pN2a Distant Metastases (pM): pMX 2. FLUORESCENCE IN-SITU HYBRIDIZATION Results: HER2 - NEGATIVE RATIO OF HER2/CEP17 SIGNALS 1.71 AVERAGE HER2 COPY NUMBER PER CELL 1.80 2. PROGNOSTIC INDICATORS Results: IMMUNOHISTOCHEMICAL AND MORPHOMETRIC ANALYSIS PERFORMED MANUALLY Estrogen Receptor: 95%, POSITIVE, STRONG STAINING INTENSITY Progesterone Receptor: 95%, POSITIVE, STRONG STAINING INTENSITY Proliferation Marker Ki67: 10%  Diagnosis 06/11/17 Breast, right, needle core biopsy, 11:00 o'clock - INVASIVE DUCTAL CARCINOMA, SEE COMMENT. Microscopic Comment The carcinoma appears grade 2. Prognostic markers will be ordered. Dr. Lyndon Code has reviewed the case. The case was called to [facility] on 06/12/2017. Results: HER2 - **POSITIVE** RATIO OF HER2/CEP17 SIGNALS 2.55 AVERAGE HER2 COPY NUMBER PER CELL 4.85  PROCEDURES  ECHO 08/12/17 Study Conclusions  - Left ventricle: The cavity size was normal. Wall thickness was   normal. Systolic function was normal. The estimated ejection   fraction was in the range of 60% to 65%. Wall motion was normal;   there were no regional wall motion abnormalities. GLS -20.2%.   Left ventricular diastolic function parameters were normal. - Aortic valve: There was no stenosis. - Mitral valve: There was no regurgitation. - Right  ventricle: The cavity size was normal. Systolic function   was normal. - Pulmonary arteries: No complete TR doppler jet so unable to   estimate PA systolic pressure. - Inferior vena cava: The vessel was normal in size. The   respirophasic diameter changes were in the normal range (>= 50%),   consistent with normal central venous pressure.  Impressions:  - Normal LV size with EF 60-65%. Normal diastolic function. Strain   as noted above. Normal RV size and systolic function. No  significant valvular abnormalities.   RADIOGRAPHIC STUDIES: I have personally reviewed the radiological images as listed and agreed with the findings in the report. No results found.   ASSESSMENT & PLAN: Sheila [name] is a 41 y.o. premenopausal female with a history of Vitamin D deficiency and high cholesterol and kidney stones, presented with a palpable right breast mass.   1. Malignant neoplasm of upper-outer quadrant of right breast in female, mixed invasive ductal and lobular carcinoma, pT3(m)N2aM0, stage 1b, ductal carcinoma triple positive, lobular carcinoma ER+/PR+/HER2-,  Grade 2 2. Genetics - negative for all mutations tested 3. Hyperglycemia 4. Social work issues 5. Febrile neutropenia 6. Nausea/diarrhea 7. Hypokalemia 8. Insomnia 9. Nosebleeds 10. Tachycardia  Sheila [name] is stable today, she has completed 4 cycles adjuvant TCHP, tolerating well overall. Symptoms are well controlled with supportive measures. She is tachycardic on exam, 128 apical. HR consistently elevated here. May be some component of mild dehydration, possibly related to mild anemia, Hgb 9.3., she is asymptomatic and does not require blood transfusion at this time. Will continue to monitor.  Next echo 12/31; previous echo was normal with EF 60-65%. She will receive supportive IVF today and will return in 3 weeks for f/u and cycle 5 TCHP.  PLAN IVF today Return in 2 weeks for f/u and cycle 5 TCHP Next echo 12/31  All  questions were answered. The patient knows to call the clinic with any problems, questions or concerns. No barriers to learning was detected.     [name], NP 11/07/17

## 2017-11-13 ENCOUNTER — Ambulatory Visit: Payer: PRIVATE HEALTH INSURANCE | Admitting: Physical Therapy

## 2017-11-13 DIAGNOSIS — R293 Abnormal posture: Secondary | ICD-10-CM

## 2017-11-13 DIAGNOSIS — Z483 Aftercare following surgery for neoplasm: Secondary | ICD-10-CM

## 2017-11-13 DIAGNOSIS — M6281 Muscle weakness (generalized): Secondary | ICD-10-CM

## 2017-11-13 DIAGNOSIS — M25611 Stiffness of right shoulder, not elsewhere classified: Secondary | ICD-10-CM

## 2017-11-13 DIAGNOSIS — M25612 Stiffness of left shoulder, not elsewhere classified: Secondary | ICD-10-CM

## 2017-11-13 NOTE — Therapy (Addendum)
[place], [place], [address] Phone: [phone]   Fax:  [phone]  Physical Therapy Treatment  Patient Details  Name: Sheila [name] MRN: [id number] Date of Birth: 07-25-1976 Referring Provider: Dr. [name]   Encounter Date: 11/13/2017  PT End of Session - 11/13/17 1628    Visit Number  18    Number of Visits  23    Date for PT Re-Evaluation  11/28/17    PT Start Time  1528    PT Stop Time  1616    PT Time Calculation (min)  48 min    Activity Tolerance  Patient tolerated treatment well    Behavior During Therapy  [facility] for tasks assessed/performed       Past Medical History:  Diagnosis Date  . Anxiety   . Cancer [facility])    right breast   . Family history of breast cancer   . Family history of colon cancer   . GERD (gastroesophageal reflux disease)   . Headache    couple of migraines in the past  . History of kidney stones   . Vitamin D deficiency     Past Surgical History:  Procedure Laterality Date  . egg donation    . kidney stone removal    . MASTECTOMY W/ SENTINEL NODE BIOPSY Bilateral 07/22/2017   Procedure: RIGHT TOTAL MASTECTOMY WITH RIGHT AXILLARY SENTINEL LYMPH NODE BIOPSY, LEFT PROPHYLACTIC MASTECTOMY;  Surgeon: [name], MD;  Location: [place];  Service: General;  Laterality: Bilateral;  . PORTACATH PLACEMENT Right 07/22/2017   Procedure: INSERTION PORT-A-CATH WITH [name];  Surgeon: [name], MD;  Location: [place];  Service: General;  Laterality: Right;    There were no vitals filed for this visit.  Subjective Assessment - 11/13/17 1529    Subjective  Fatigue is kicking my butt. Has done very little exercise. "I'm starting to get used to the swelling.  I don't really think it's going to go down much more." The ROM is great.  I walk from the bedroom to the living room to the bedroom again, twice a day.    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 It measures 5.5 cm and is located in the upper outer quadrant with a Ki67 of 12%. , Bilateral mastectomies on 07/22/17 with SLNB on R with 2 to 3 additional nodes taken, pt has to complete chemo and radiation and will begin chemo on Aug 29, 2017    Currently in Pain?  No/denies                      [facility] Adult PT Treatment/Exercise - 11/13/17 0001      Self-Care   Other Self-Care Comments   discussed doing supine over pillow (between shoulders) for chest stretch and/or lie on back at edge of bed and hang arm toward floor      Shoulder Exercises: Supine   Other Supine Exercises  supine over towel roll, isometric shoulder extension/scapular retractions, 5 counts x 5    Other Supine Exercises  hooklying with arms outstretched, lower trunk rotation (showed three alternative leg positions for this)      Shoulder Exercises: Standing   External Rotation  Strengthening;Right;Left;10 reps;Theraband    Theraband Level (Shoulder External Rotation)  Level 3 (Green)    Internal Rotation  Strengthening;Right;Left;10 reps    Theraband Level (Shoulder Internal Rotation)  Level  3 (Green)    Flexion  Strengthening;Right;Left;10 reps;Theraband    Theraband Level (Shoulder Flexion)  Level 3 (Green)    Extension  Strengthening;Right;Left;10 reps    Theraband Level (Shoulder Extension)  Level 3 (Green)      Shoulder Exercises: Pulleys   Flexion  2 minutes    ABduction  2 minutes      Shoulder Exercises: Therapy Ball   Flexion  10 reps with stretch at end      Manual Therapy   Soft tissue mobilization  in supine at mastectomy incisions    Myofascial Release  crosshands technique in horizontal at upper chest, in vertical and diagonal at right and at left chest/flank/abdomen    Passive ROM  in supine over towel roll, pect minor stretches held approx. 60 seconds x 3 with brief break in between              PT Education - 11/13/17 1628    Education provided  Yes    Education Details  lower trunk rotation in hooklying with arms outstretched for chest stretches both ways; progressed [place] to green Theraband; discussed doing door stretches for chest also, which patient says she already does    Person(s) Educated  Patient    Methods  Explanation;Verbal cues issued green Theraband for [place] today    Comprehension  Verbalized understanding;Returned demonstration           Breast Clinic Goals - 06/18/17 1221      Patient will be able to verbalize understanding of pertinent lymphedema risk reduction practices relevant to her diagnosis specifically related to skin care.   Time  1    Period  Days    Status  Achieved      Patient will be able to return demonstrate and/or verbalize understanding of the post-op home exercise program related to regaining shoulder range of motion.   Time  1    Period  Days    Status  Achieved      Patient will be able to verbalize understanding of the importance of attending the postoperative After Breast Cancer Class for further lymphedema risk reduction education and therapeutic exercise.   Time  1    Period  Days    Status  Achieved       Long Term Clinic Goals - 10/22/17 1144      CC Long Term Goal  #1   Title  Pt to demonstrate 165 degrees of bilateral shoulder flexion to allow her to reach up and overhead    Baseline  R 98, L 125, 08/25/17- R 122, L 141, 09/10/17- 132 on R, 148 on L, 09/29/17- R 135, L 156, 10/08/17- R 144, L 169; 10/22/17-Rt. 141    Status  Partially Met      CC Long Term Goal  #2   Title  Pt to demonstrate 165 degrees of bilateral shoulder abduction to allow pt to reach out to sides    Status  Achieved      CC Long Term Goal  #3   Title  Pt to demonstrate 70 degrees of right shoulder internal rotation to allow her to return to prior level of function    Status  Achieved      CC Long Term Goal  #4   Title   Pt to report a 75% improvement in right trunk edema to allow improved comfort and sleep    Status  On-going      CC Long Term Goal  #  5   Title  Pt to be independent in a home exercise program for continued strengthening and stretching    Status  On-going         Plan - 11/13/17 1629    Clinical Impression Statement  Pt. is having difficulty doing exercise at home because of fatigue and weakness from chemo.  She tolerated what we did in clinic today; she did not think she could go to 2 lbs. for 3-way arm raises. She did increase [place] to green Theraband today.    Rehab Potential  Good    Clinical Impairments Affecting Rehab Potential  pt to begin chemo Oct 12 and will have it every 3 weeks; has 2 left of the main chemo as of 11/13/17    PT Frequency  2x / week    PT Duration  4 weeks    PT Treatment/Interventions  ADLs/Self Care Home Management;Therapeutic activities;Therapeutic exercise;Orthotic Fit/Training;Patient/family education;Manual techniques;Manual lymph drainage;Scar mobilization;Passive range of motion;Taping    PT Next Visit Plan  increase 3 way shoulder to 2 lb if patient is able, continue soft tissue (scar) mobilization and myofascial release to chest, continue P/AA/A/ROM to right shoulder; continue to work on long term goals    PT Home Exercise Plan  Post op shoulder ROM HEP advised pt to not lift arms above 90 degrees until a week after drains removed, supine dowel; supine scapular series; strength ABC program; Rockwood for shoulders; stretching for chest    Consulted and Agree with Plan of Care  Patient       Patient will benefit from skilled therapeutic intervention in order to improve the following deficits and impairments:  Postural dysfunction, Decreased knowledge of precautions, Pain, Impaired UE functional use, Decreased range of motion, Decreased strength, Decreased scar mobility, Increased fascial restricitons  Visit Diagnosis: Stiffness of right shoulder, not  elsewhere classified  Abnormal posture  Muscle weakness (generalized)  Aftercare following surgery for neoplasm  Stiffness of left shoulder, not elsewhere classified     Problem List Patient Active Problem List   Diagnosis Date Noted  . Sepsis ([place]) 09/04/2017  . Neutropenic fever ([place]) 09/04/2017  . Hyponatremia 09/04/2017  . Port-A-Cath in place 08/28/2017  . Genetic testing 06/25/2017  . Family history of breast cancer   . Family history of colon cancer   . Malignant neoplasm of upper-outer quadrant of right breast in female, estrogen receptor positive ([place]) 06/16/2017    SALISBURY,DONNA 11/13/2017, 4:34 PM  [place], [place], [address] Phone: [phone]   Fax:  [phone]  Name: Brissa Asante MRN: [id number] Date of Birth: 05-27-1976  Serafina Royals, PT 11/13/17 4:34 PM  PHYSICAL THERAPY DISCHARGE SUMMARY  Visits from Start of Care: 18  Current functional level related to goals / functional outcomes: Goals partially met as noted above.   Remaining deficits: Unknown.  Patient did not complete her course of therapy.   Education / Equipment: Home exercise program. Plan: Patient agrees to discharge.  Patient goals were partially met. Patient is being discharged due to not returning since the last visit.  ?????    Serafina Royals, PT 02/26/18 4:13 PM

## 2017-11-17 ENCOUNTER — Ambulatory Visit (HOSPITAL_BASED_OUTPATIENT_CLINIC_OR_DEPARTMENT_OTHER)
Admission: RE | Admit: 2017-11-17 | Discharge: 2017-11-17 | Disposition: A | Discharge status: Home / Self Care | Payer: PRIVATE HEALTH INSURANCE | Source: Ambulatory Visit | Attending: Cardiology | Admitting: Cardiology

## 2017-11-17 ENCOUNTER — Other Ambulatory Visit: Payer: Self-pay | Admitting: Internal Medicine

## 2017-11-17 ENCOUNTER — Other Ambulatory Visit: Payer: Self-pay | Admitting: Cardiology

## 2017-11-17 ENCOUNTER — Ambulatory Visit (HOSPITAL_COMMUNITY)
Admission: RE | Admit: 2017-11-17 | Discharge: 2017-11-17 | Disposition: A | Discharge status: Home / Self Care | Payer: PRIVATE HEALTH INSURANCE | Source: Ambulatory Visit | Attending: Cardiology | Admitting: Cardiology

## 2017-11-17 ENCOUNTER — Other Ambulatory Visit: Payer: Self-pay

## 2017-11-17 ENCOUNTER — Ambulatory Visit (HOSPITAL_COMMUNITY)
Admission: RE | Admit: 2017-11-17 | Discharge: 2017-11-17 | Disposition: A | Discharge status: Home / Self Care | Payer: PRIVATE HEALTH INSURANCE | Source: Ambulatory Visit | Attending: Internal Medicine | Admitting: Internal Medicine

## 2017-11-17 ENCOUNTER — Encounter (HOSPITAL_COMMUNITY): Payer: Self-pay | Admitting: Cardiology

## 2017-11-17 VITALS — BP 109/44 | HR 118 | Wt 177.0 lb

## 2017-11-17 DIAGNOSIS — Z170 Estrogen receptor positive status [ER+]: Secondary | ICD-10-CM

## 2017-11-17 DIAGNOSIS — I509 Heart failure, unspecified: Secondary | ICD-10-CM

## 2017-11-17 DIAGNOSIS — Z79899 Other long term (current) drug therapy: Secondary | ICD-10-CM | POA: Insufficient documentation

## 2017-11-17 DIAGNOSIS — C50411 Malignant neoplasm of upper-outer quadrant of right female breast: Secondary | ICD-10-CM

## 2017-11-17 DIAGNOSIS — Z298 Encounter for other specified prophylactic measures: Secondary | ICD-10-CM

## 2017-11-17 DIAGNOSIS — Z9013 Acquired absence of bilateral breasts and nipples: Secondary | ICD-10-CM | POA: Diagnosis not present

## 2017-11-17 LAB — ECHOCARDIOGRAM LIMITED: Weight: 2832 oz

## 2017-11-17 LAB — ECHOCARDIOGRAM COMPLETE: Weight: 2832 oz

## 2017-11-17 MED ORDER — CARVEDILOL 3.125 MG PO TABS: 3.1250 mg | ORAL_TABLET | Freq: Two times a day (BID) | ORAL | 3 refills | Status: DC

## 2017-11-17 NOTE — Progress Notes (Signed)
  Echocardiogram Limited 2D Echocardiogram Limited to reassess STRAIN has been performed.  Sheila [name] M 11/17/2017, 11:31 AM

## 2017-11-17 NOTE — Progress Notes (Signed)
Oncology: Dr. Feng  41 yo with history of breath cancer was referred by Dr. Feng for cardio-oncology evaluation given planned Herceptin use.  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  She had bilateral mastectomies and axillary node biopsy in 7/18.  Planned for Taxol/carboplatin/Herceptin/Perjeta q 3 wks x 6 cycles starting 10/18, then maintenance Herceptin to complete a year.  She has 2 more doses of chemotherapy to go.   No history of cardiac problems.  Nonsmoker.   She is doing well so far with treatment but has noted increased heart rate.  She was in a mild sinus tachycardia today.     PMH: 1. Breast cancer: Diagnosed 7/18 on right, ER+/PR+/HER2+.  She had bilateral mastectomies and axillary node biopsy in 7/18.  Planned for Taxol/carboplatin/Herceptin/Perjeta q 3 wks x 6 cycles starting 10/12, then maintenance Herceptin to complete a year.  - Echo (9/18): EF 60-65%, normal diastolic function, GLS -20.2%.  - Echo (12/18): EF 60-65%, normal diastolic function, normal RV size and systolic function, GLS -13.5%.   Family History  Problem Relation Age of Onset  . Breast cancer Paternal Grandmother 68  . Colon cancer Paternal Grandmother 81  . Breast cancer Mother 61  . Prostate cancer Paternal Grandfather        dx in his 50s  . Leukemia Paternal Grandfather   . Lung cancer Maternal Uncle        heavy smoker  . Other Paternal Uncle        farm accident  . Stroke Maternal Grandfather    Social History   Socioeconomic History  . Marital status: Single    Spouse name: Not on file  . Number of children: Not on file  . Years of education: Not on file  . Highest education level: Not on file  Social Needs  . Financial resource strain: Not on file  . Food insecurity - worry: Not on file  . Food insecurity - inability: Not on file  . Transportation needs - medical: Not on file  . Transportation needs - non-medical: Not on file  Occupational History  . Not on file   Tobacco Use  . Smoking status: Never Smoker  . Smokeless tobacco: Former User  Substance and Sexual Activity  . Alcohol use: Yes    Comment: once a year  . Drug use: No  . Sexual activity: Yes  Other Topics Concern  . Not on file  Social History Narrative  . Not on file   ROS: All systems reviewed and negative except as per HPI.  Current Outpatient Medications  Medication Sig Dispense Refill  . calcium carbonate (TUMS - DOSED IN MG ELEMENTAL CALCIUM) 500 MG chewable tablet Chew 2 tablets by mouth as needed for indigestion or heartburn.     . Cholecalciferol (VITAMIN D) 2000 units tablet Take 2,000 Units by mouth daily.    . dexamethasone (DECADRON) 4 MG tablet Take 2 tablets (8 mg total) by mouth 2 (two) times daily. Start the day before Taxotere. Then again the day after chemo for 3 days. 30 tablet 1  . diphenoxylate-atropine (LOMOTIL) 2.5-0.025 MG tablet Take 1-2 tablets by mouth 4 (four) times daily as needed for diarrhea or loose stools. 30 tablet 2  . fluticasone (FLONASE) 50 MCG/ACT nasal spray Place 1 spray into both nostrils daily.    . lidocaine-prilocaine (EMLA) cream Apply to affected area once 30 g 3  . loperamide (IMODIUM) 2 MG capsule Take 1 capsule (2 mg total) by mouth 3 (  three) times daily as needed for diarrhea or loose stools. 30 capsule 0  . mirtazapine (REMERON) 30 MG tablet Take 1 tablet (30 mg total) by mouth at bedtime. 30 tablet 2  . naproxen sodium (ANAPROX) 220 MG tablet Take 220 mg 2 (two) times daily as needed by mouth (pain).     . omeprazole (PRILOSEC) 20 MG capsule Take 20 mg by mouth as needed.     . potassium chloride (K-DUR) 10 MEQ tablet Take 2 tablets (20 mEq total) daily by mouth. Take 2 tabs twice daily for 5 days then take 2 tablets daily (Patient taking differently: Take 20 mEq by mouth every other day. ) 60 tablet 1  . prochlorperazine (COMPAZINE) 10 MG tablet Take 1 tablet (10 mg total) by mouth every 6 (six) hours as needed (Nausea or  vomiting). 30 tablet 1  . promethazine (PHENERGAN) 12.5 MG tablet Take 1 tablet (12.5 mg total) every 6 (six) hours as needed by mouth for nausea or vomiting. 30 tablet 2  . carvedilol (COREG) 3.125 MG tablet Take 1 tablet (3.125 mg total) by mouth 2 (two) times daily. 60 tablet 3   Current Facility-Administered Medications  Medication Dose Route Frequency Provider Last Rate Last Dose  . gi cocktail (Maalox,Lidocaine,Donnatal)  30 mL Oral Once Tanner, Van E., PA-C       BP (!) 109/44 (BP Location: Right Arm, Patient Position: Sitting, Cuff Size: Normal)   Pulse (!) 118   Wt 177 lb (80.3 kg)   SpO2 100%   BMI 28.57 kg/m  General: NAD Neck: No JVD, no thyromegaly or thyroid nodule.  Lungs: Clear to auscultation bilaterally with normal respiratory effort. CV: Nondisplaced PMI.  Heart mildly tachycardic, regular S1/S2, no S3/S4, no murmur.  No peripheral edema.  No carotid bruit.  Normal pedal pulses.  Abdomen: Soft, nontender, no hepatosplenomegaly, no distention.  Skin: Intact without lesions or rashes.  Neurologic: Alert and oriented x 3.  Psych: Normal affect. Extremities: No clubbing or cyanosis.  HEENT: Normal.   Assessment/Plan:  41 yo with history of breast cancer, 1 year of Herceptin therapy planned.  Her initial echo was normal. I reviewed today's echo: EF remains 60-65% but global longitudinal strain measurement is significantly less negative, down to -13.5% from -20.2%.  The strain images appear adequate. I am concerned that this indicates an early drop in LV systolic function (?if mild sinus tachycardia is related).    - Start Coreg 3.125 mg bid.  - Hold Herceptin for now (will send message to Dr. Feng).   - Repeat echo in 1 month.  If strain has recovered, would be reasonable to re-challenge with Herceptin.   Sheila [name] 11/17/2017  

## 2017-11-17 NOTE — Progress Notes (Signed)
  Echocardiogram 2D Echocardiogram has been performed.  Sheila [name] 11/17/2017, 10:11 AM

## 2017-11-17 NOTE — Patient Instructions (Signed)
Your physician has requested that you have an echocardiogram. Echocardiography is a painless test that uses sound waves to create images of your heart. It provides your doctor with information about the size and shape of your heart and how well your heart's chambers and valves are working. This procedure takes approximately one hour. There are no restrictions for this procedure.  Your physician recommends that you schedule a follow-up appointment in: 3 months with Dr. McLean  an a echocardiogram    

## 2017-11-21 ENCOUNTER — Ambulatory Visit (HOSPITAL_BASED_OUTPATIENT_CLINIC_OR_DEPARTMENT_OTHER): Payer: PRIVATE HEALTH INSURANCE

## 2017-11-21 ENCOUNTER — Ambulatory Visit: Payer: PRIVATE HEALTH INSURANCE

## 2017-11-21 ENCOUNTER — Encounter: Payer: Self-pay | Admitting: Nurse Practitioner

## 2017-11-21 ENCOUNTER — Inpatient Hospital Stay: Payer: PRIVATE HEALTH INSURANCE | Attending: Nurse Practitioner | Admitting: Nurse Practitioner

## 2017-11-21 ENCOUNTER — Other Ambulatory Visit (HOSPITAL_BASED_OUTPATIENT_CLINIC_OR_DEPARTMENT_OTHER): Payer: PRIVATE HEALTH INSURANCE

## 2017-11-21 VITALS — BP 120/68 | HR 120 | Temp 98.4°F | Resp 20 | Ht 66.0 in | Wt 171.4 lb

## 2017-11-21 DIAGNOSIS — E876 Hypokalemia: Secondary | ICD-10-CM | POA: Diagnosis not present

## 2017-11-21 DIAGNOSIS — D701 Agranulocytosis secondary to cancer chemotherapy: Secondary | ICD-10-CM | POA: Diagnosis not present

## 2017-11-21 DIAGNOSIS — C50411 Malignant neoplasm of upper-outer quadrant of right female breast: Secondary | ICD-10-CM | POA: Insufficient documentation

## 2017-11-21 DIAGNOSIS — R000 Tachycardia, unspecified: Secondary | ICD-10-CM | POA: Insufficient documentation

## 2017-11-21 DIAGNOSIS — E559 Vitamin D deficiency, unspecified: Secondary | ICD-10-CM | POA: Insufficient documentation

## 2017-11-21 DIAGNOSIS — Z803 Family history of malignant neoplasm of breast: Secondary | ICD-10-CM | POA: Insufficient documentation

## 2017-11-21 DIAGNOSIS — Z5111 Encounter for antineoplastic chemotherapy: Secondary | ICD-10-CM

## 2017-11-21 DIAGNOSIS — R5081 Fever presenting with conditions classified elsewhere: Secondary | ICD-10-CM | POA: Insufficient documentation

## 2017-11-21 DIAGNOSIS — G4700 Insomnia, unspecified: Secondary | ICD-10-CM | POA: Insufficient documentation

## 2017-11-21 DIAGNOSIS — K5900 Constipation, unspecified: Secondary | ICD-10-CM | POA: Insufficient documentation

## 2017-11-21 DIAGNOSIS — Z87442 Personal history of urinary calculi: Secondary | ICD-10-CM | POA: Insufficient documentation

## 2017-11-21 DIAGNOSIS — R5383 Other fatigue: Secondary | ICD-10-CM | POA: Insufficient documentation

## 2017-11-21 DIAGNOSIS — Z79899 Other long term (current) drug therapy: Secondary | ICD-10-CM | POA: Insufficient documentation

## 2017-11-21 DIAGNOSIS — Z8042 Family history of malignant neoplasm of prostate: Secondary | ICD-10-CM | POA: Insufficient documentation

## 2017-11-21 DIAGNOSIS — Z170 Estrogen receptor positive status [ER+]: Principal | ICD-10-CM

## 2017-11-21 DIAGNOSIS — R110 Nausea: Secondary | ICD-10-CM | POA: Insufficient documentation

## 2017-11-21 DIAGNOSIS — E7800 Pure hypercholesterolemia, unspecified: Secondary | ICD-10-CM | POA: Insufficient documentation

## 2017-11-21 DIAGNOSIS — F418 Other specified anxiety disorders: Secondary | ICD-10-CM | POA: Insufficient documentation

## 2017-11-21 DIAGNOSIS — K219 Gastro-esophageal reflux disease without esophagitis: Secondary | ICD-10-CM | POA: Insufficient documentation

## 2017-11-21 DIAGNOSIS — R739 Hyperglycemia, unspecified: Secondary | ICD-10-CM | POA: Insufficient documentation

## 2017-11-21 DIAGNOSIS — R12 Heartburn: Secondary | ICD-10-CM | POA: Insufficient documentation

## 2017-11-21 DIAGNOSIS — M5137 Other intervertebral disc degeneration, lumbosacral region: Secondary | ICD-10-CM | POA: Insufficient documentation

## 2017-11-21 DIAGNOSIS — R232 Flushing: Secondary | ICD-10-CM | POA: Insufficient documentation

## 2017-11-21 DIAGNOSIS — R197 Diarrhea, unspecified: Secondary | ICD-10-CM | POA: Insufficient documentation

## 2017-11-21 DIAGNOSIS — Z95828 Presence of other vascular implants and grafts: Secondary | ICD-10-CM

## 2017-11-21 DIAGNOSIS — Z7689 Persons encountering health services in other specified circumstances: Secondary | ICD-10-CM | POA: Insufficient documentation

## 2017-11-21 DIAGNOSIS — D630 Anemia in neoplastic disease: Secondary | ICD-10-CM

## 2017-11-21 DIAGNOSIS — Z9011 Acquired absence of right breast and nipple: Secondary | ICD-10-CM | POA: Insufficient documentation

## 2017-11-21 DIAGNOSIS — M549 Dorsalgia, unspecified: Secondary | ICD-10-CM | POA: Insufficient documentation

## 2017-11-21 DIAGNOSIS — Z800 Family history of malignant neoplasm of digestive organs: Secondary | ICD-10-CM | POA: Insufficient documentation

## 2017-11-21 LAB — CBC WITH DIFFERENTIAL/PLATELET
BASO%: 0.1 % (ref 0.0–2.0)
BASOS ABS: 0 10*3/uL (ref 0.0–0.1)
EOS ABS: 0 10*3/uL (ref 0.0–0.5)
EOS%: 0 % (ref 0.0–7.0)
HCT: 27.5 % — ABNORMAL LOW (ref 34.8–46.6)
HEMOGLOBIN: 9.1 g/dL — AB (ref 11.6–15.9)
LYMPH%: 13.7 % — ABNORMAL LOW (ref 14.0–49.7)
MCH: 31.3 pg (ref 25.1–34.0)
MCHC: 33.2 g/dL (ref 31.5–36.0)
MCV: 94.3 fL (ref 79.5–101.0)
MONO#: 0.3 10*3/uL (ref 0.1–0.9)
MONO%: 5.3 % (ref 0.0–14.0)
NEUT#: 5.2 10*3/uL (ref 1.5–6.5)
NEUT%: 80.9 % — ABNORMAL HIGH (ref 38.4–76.8)
Platelets: 162 10*3/uL (ref 145–400)
RBC: 2.92 10*6/uL — ABNORMAL LOW (ref 3.70–5.45)
RDW: 19.3 % — AB (ref 11.2–14.5)
WBC: 6.4 10*3/uL (ref 3.9–10.3)
lymph#: 0.9 10*3/uL (ref 0.9–3.3)

## 2017-11-21 LAB — COMPREHENSIVE METABOLIC PANEL
ALT: 31 U/L (ref 0–55)
ANION GAP: 10 meq/L (ref 3–11)
AST: 23 U/L (ref 5–34)
Albumin: 3.5 g/dL (ref 3.5–5.0)
Alkaline Phosphatase: 62 U/L (ref 40–150)
BUN: 10.5 mg/dL (ref 7.0–26.0)
CHLORIDE: 107 meq/L (ref 98–109)
CO2: 23 meq/L (ref 22–29)
CREATININE: 0.9 mg/dL (ref 0.6–1.1)
Calcium: 9.1 mg/dL (ref 8.4–10.4)
EGFR: >60 mL/min/{1.73_m2} (ref >60)
Glucose: 225 mg/dl — ABNORMAL HIGH (ref 70–140)
Potassium: 3.7 mEq/L (ref 3.5–5.1)
SODIUM: 139 meq/L (ref 136–145)
Total Bilirubin: 0.4 mg/dL (ref 0.20–1.20)
Total Protein: 6.5 g/dL (ref 6.4–8.3)

## 2017-11-21 MED ORDER — DEXAMETHASONE SODIUM PHOSPHATE 10 MG/ML IJ SOLN
10.0000 mg | Freq: Once | INTRAMUSCULAR | Status: AC
  Administered 2017-11-21: 10 mg via INTRAVENOUS

## 2017-11-21 MED ORDER — SODIUM CHLORIDE 0.9% FLUSH
10.0000 mL | Freq: Once | INTRAVENOUS | Status: AC
  Administered 2017-11-21: 10 mL
  Filled 2017-11-21: qty 10

## 2017-11-21 MED ORDER — PALONOSETRON HCL INJECTION 0.25 MG/5ML
0.2500 mg | Freq: Once | INTRAVENOUS | Status: AC
  Administered 2017-11-21: 0.25 mg via INTRAVENOUS

## 2017-11-21 MED ORDER — SODIUM CHLORIDE 0.9 % IV SOLN
75.0000 mg/m2 | Freq: Once | INTRAVENOUS | Status: AC
  Administered 2017-11-21: 140 mg via INTRAVENOUS
  Filled 2017-11-21: qty 14

## 2017-11-21 MED ORDER — SODIUM CHLORIDE 0.9 % IV SOLN
Freq: Once | INTRAVENOUS | Status: AC
  Administered 2017-11-21: 11:00:00 via INTRAVENOUS

## 2017-11-21 MED ORDER — PALONOSETRON HCL INJECTION 0.25 MG/5ML
INTRAVENOUS | Status: AC
  Filled 2017-11-21: qty 5

## 2017-11-21 MED ORDER — SODIUM CHLORIDE 0.9 % IV SOLN
547.2000 mg | Freq: Once | INTRAVENOUS | Status: AC
  Administered 2017-11-21: 550 mg via INTRAVENOUS
  Filled 2017-11-21: qty 55

## 2017-11-21 MED ORDER — PEGFILGRASTIM 6 MG/0.6ML ~~LOC~~ PSKT
PREFILLED_SYRINGE | SUBCUTANEOUS | Status: AC
  Filled 2017-11-21: qty 0.6

## 2017-11-21 MED ORDER — HEPARIN SOD (PORK) LOCK FLUSH 100 UNIT/ML IV SOLN
500.0000 [IU] | Freq: Once | INTRAVENOUS | Status: AC | PRN
  Administered 2017-11-21: 500 [IU]
  Filled 2017-11-21: qty 5

## 2017-11-21 MED ORDER — DEXAMETHASONE SODIUM PHOSPHATE 10 MG/ML IJ SOLN
INTRAMUSCULAR | Status: AC
  Filled 2017-11-21: qty 1

## 2017-11-21 MED ORDER — SODIUM CHLORIDE 0.9% FLUSH
10.0000 mL | INTRAVENOUS | Status: DC | PRN
  Administered 2017-11-21: 10 mL
  Filled 2017-11-21: qty 10

## 2017-11-21 MED ORDER — PEGFILGRASTIM 6 MG/0.6ML ~~LOC~~ PSKT
6.0000 mg | PREFILLED_SYRINGE | Freq: Once | SUBCUTANEOUS | Status: AC
  Administered 2017-11-21: 6 mg via SUBCUTANEOUS

## 2017-11-21 NOTE — Patient Instructions (Signed)
[facility] Discharge Instructions for Patients Receiving Chemotherapy  Today you received the following chemotherapy agents Taxotere, Carboplatin  To help prevent nausea and vomiting after your treatment, we encourage you to take your nausea medication as directed  If you develop nausea and vomiting that is not controlled by your nausea medication, call the clinic.   BELOW ARE SYMPTOMS THAT SHOULD BE REPORTED IMMEDIATELY:  *FEVER GREATER THAN 100.5 F  *CHILLS WITH OR WITHOUT FEVER  NAUSEA AND VOMITING THAT IS NOT CONTROLLED WITH YOUR NAUSEA MEDICATION  *UNUSUAL SHORTNESS OF BREATH  *UNUSUAL BRUISING OR BLEEDING  TENDERNESS IN MOUTH AND THROAT WITH OR WITHOUT PRESENCE OF ULCERS  *URINARY PROBLEMS  *BOWEL PROBLEMS  UNUSUAL RASH Items with * indicate a potential emergency and should be followed up as soon as possible.  Feel free to call the clinic should you have any questions or concerns. The clinic phone number is [phone].  Please show the CHEMO ALERT CARD at check-in to the Emergency Department and triage nurse.   

## 2017-11-21 NOTE — Progress Notes (Signed)
[place]  [phone] [phone]) [phone]  Clinic Follow up Note   Patient Care Team: [name], MD as PCP - General (Family Medicine) [name] (Family Medicine) [name], MD as Consulting Physician (General Surgery) [name], MD as Consulting Physician (Hematology) Kyung Rudd, MD as Consulting Physician (Radiation Oncology) 11/21/2017  CHIEF COMPLAINT:  F/u R breast cancer   SUMMARY OF ONCOLOGIC HISTORY: Oncology History    Cancer Staging Malignant neoplasm of upper-outer quadrant of right breast in female, estrogen receptor positive ([place]) Staging form: Breast, AJCC 8th Edition - Clinical stage from 06/11/2017: Stage IB (cT3, cN0, cM0, G2, ER: Positive, PR: Positive, HER2: Positive) - Signed by [name], MD on 06/16/2017 - Pathologic stage from 07/22/2017: Stage IB (pT3(m), pN2a(sn), cM0, G2, ER: Positive, PR: Positive, HER2: Positive) - Signed by [name], NP on 08/04/2017      Malignant neoplasm of upper-outer quadrant of right breast in female, estrogen receptor positive ([place])   06/11/2017 Initial Biopsy    Diagnosis 06/11/17 Breast, right, needle core biopsy, 11:00 o'clock - INVASIVE DUCTAL CARCINOMA, G2      06/11/2017 Mammogram    [name] and MM Diagnostic Breast Tomo Bilateral 06/11/17 IMPRESSION: 1. Highly suspicious mass within the retroareolar right breast, extending from the 8:00 to 11:00 axes, anterior to posterior depth, extending anteriorly to the nipple with associated nipple retraction, measuring at least 5.5 cm by ultrasound  2. No sonographic evidence of metastatic lymphadenopathy in the right axilla. 3. No evidence of malignancy within the left breast.       06/11/2017 Initial Diagnosis    Malignant neoplasm of upper-outer quadrant of right breast in female, estrogen receptor positive ([place])      06/11/2017 Receptors her2    Estrogen Receptor: 100%, POSITIVE, STRONG STAINING INTENSITY Progesterone  Receptor: 100%, POSITIVE, STRONG STAINING INTENSITY Proliferation Marker Ki67: 12%Proliferation Marker Ki67: 12%  HER2 - **POSITIVE** RATIO OF HER2/CEP17 SIGNALS 2.55 AVERAGE HER2 COPY NUMBER PER CELL 4.85      06/25/2017 Genetic Testing    Negative genetic testing on the 9 gene STAT panel.  The STAT Breast cancer panel offered by [facility] includes sequencing and rearrangement analysis for the following 9 genes:  ATM, BRCA1, BRCA2, CDH1, CHEK2, PALB2, PTEN, STK11 and TP53.   The report date is June 25, 2017.  Negative genetic testing on the common hereditary cancer panel.  The Hereditary Gene Panel offered by [facility] includes sequencing and/or deletion duplication testing of the following 46 genes: APC, ATM, AXIN2, BARD1, BMPR1A, BRCA1, BRCA2, BRIP1, CDH1, CDKN2A (p14ARF), CDKN2A (p16INK4a), CHEK2, CTNNA1, DICER1, EPCAM (Deletion/duplication testing only), GREM1 (promoter region deletion/duplication testing only), KIT, MEN1, MLH1, MSH2, MSH3, MSH6, MUTYH, NBN, NF1, NHTL1, PALB2, PDGFRA, PMS2, POLD1, POLE, PTEN, RAD50, RAD51C, RAD51D, SDHB, SDHC, SDHD, SMAD4, SMARCA4. STK11, TP53, TSC1, TSC2, and VHL.  The following genes were evaluated for sequence changes only: SDHA and HOXB13 c.251G>A variant only.  The report date is June 25, 2017.       07/02/2017 Imaging    CT cAP 07/02/17 IMPRESSION: 1. Subareolar right breast mass. No compelling findings of nodal or metastatic involvement. 2. Old granulomatous disease. 3. There is a 7 mm enhancing or hyperdense focus posteriorly in segment 7 of the liver in the subcapsular region. By virtue of its small size this lesion is technically nonspecific although statistically likely to be a small benign lesions such as flash filling hemangioma. This may warrant surveillance. 4.  Prominent stool throughout the colon favors constipation.  5. Degenerative disc disease and spondylosis at L5-S1 likely causing mild impingement.      07/02/2017 Imaging    Bone  scan 07/02/17 IMPRESSION: Today' s exam is negative. I ascribed the tiny focus of activity just proximal to the left antecubital region to injection site.      07/22/2017 Surgery    RIGHT TOTAL MASTECTOMY WITH RIGHT AXILLARY SENTINEL LYMPH NODE BIOPSY, LEFT PROPHYLACTIC MASTECTOM and INSERTION PORT-A-CATH WITH [name] by [name] and [facility]       07/22/2017 Pathology Results    Diagnosis 1. Breast, simple mastectomy, Left - FIBROADENOMA. - NO MALIGNANCY IDENTIFIED. 2. 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 - ADDITIONAL FOCUS OF LOBULAR CARCINOMA, GRADE 2, SPANNING 2.2 CM. - INTERMEDIATE GRADE DUCTAL CARCINOMA IN SITU. - INVASIVE CARCINOMA COMES TO WITHIN 0.2 TO 0.3 CM OF THE DEEP MARGIN, FOCALLY. - TUMOR FOCALLY INVOLVES EPIDERMIS. - LYMPHOVASCULAR INVASION OF DERMAL LYMPHATICS. - ONE OF ONE LYMPH NODES NEGATIVE FOR CARCINOMA (0/1). - SEE ONCOLOGY TABLE. 3. Lymph node, sentinel, biopsy, Right axillary - ONE LYMPH NODE WITH ISOLATED TUMOR CELLS (0/1). 4. Lymph node, sentinel, biopsy, Right - METASTATIC CARCINOMA IN ONE OF ONE LYMPH NODES (1/1). 5. Lymph node, sentinel, biopsy, Right - ONE OF ONE LYMPH NODES NEGATIVE FOR CARCINOMA (0/1). 6. Lymph node, sentinel, biopsy, Right - METASTATIC CARCINOMA IN ONE OF ONE LYMPH NODES (1/1). - EXTRACAPSULAR EXTENSION. 7. Lymph node, sentinel, biopsy, Right - METASTATIC CARCINOMA IN ONE OF ONE LYMPH NODES (1/1). - EXTRACAPSULAR EXTENSION. 8. Lymph node, sentinel, biopsy, Right - ONE OF ONE LYMPH NODES NEGATIVE FOR CARCINOMA (0/1). 9. Lymph node, sentinel, biopsy, Right - ONE OF ONE LYMPH NODES NEGATIVE FOR CARCINOMA (0/1). 10. Lymph node, sentinel, biopsy, Right - METASTATIC CARCINOMA IN ONE OF ONE LYMPH NODES (1/1). - EXTRACAPSULAR EXTENSION. 11. Lymph node, biopsy, Right axillary - ONE OF ONE LYMPH NODES NEGATIVE FOR CARCINOMA (0/1).       07/22/2017 Receptors her2     Ductal carcinoma in primary breast tumor and node metastasis are ER 95%, PR 95% strongly positive, and HER2 + The lobular component in primary breast tumor is ER 95% positive, PR 95% positive, strong staining, HER-2 negative.       08/25/2017 Imaging    MRI abdomen done at [facility]:  Enhancing structure measing 5 mm is compatible with a flash filling hemangioma. No specific findings to suggest metastatic cancer      08/25/2017 Imaging    MRI Abdomen at [facility] 08/25/17  IMPRESSION: 1. Lesion in segment 7 of the liver has signal and enhancement characteristics compatible with a flash filling hemangioma. No specific findings identified to suggest metastatic disease.      08/29/2017 -  Chemotherapy    adjuvant TCHP every 3 weeks for 6 cycles starting 08/29/17, followed by maintenance Herceptin with or without Perjeta for 6-12 months. Postponed and decreased Carbo to 680m and added Onpro with cycle 2 on 09/19/17 due to neutropenic fever. Carbplatin further reduced to 502mstarting with cycle 3 due to thrombocytopenia.          09/04/2017 - 09/09/2017 Hospital Admission    Admit date: 09/04/2017 Discharge date: 09/09/2017  DISCHARGE DIAGNOSES:  Principal Problem:   [facility] Problems:   Malignant neoplasm of upper-outer quadrant of right breast in female, estrogen receptor positive ([place]  Port-A-Cath in place   Neutropenic fever ([place]  Hyponatremia   Malignant neoplasm of breast in female, estrogen  receptor positive ([place])      CURRENT THERAPY:  adjuvant chemotherapy TCHP every 3 weeks for 6 cycles beginning 08/29/2017. Decrease Carbo to 614m and added Onpro with cycle 2 on 09/19/17 due to neutropenic fever. Carboplatin further reduced to AUC 4.5 starting with cycle 3 due to thrombocytopenia.  Hold Herceptin/Perjeta from cycle 5 on 11/21/17 due to GLS change from -20.2% to -13.5% on 11/17/17 echo, concern for possible drop in LV systolic function  INTERVAL HISTORY: Ms.  LHattabaughreturns for follow up as scheduled prior to cycle 5 TCHP. She had echo and cardiology f/u on 12/31. She feels well today but has had more fatigue since last cycle, she does not leave the house much and fatigues easily with errands or chores. Has intermittent loose BM, no recent diarrhea; takes lomotil PRN. Occasional nausea without vomiting, improves with compazine PRN. She is drinking well but food intake often low. Drinks 1 boost per day. Remeron has improved sleep but has not had much effect on appetite. Mood is stable.   REVIEW OF SYSTEMS:   Constitutional: Denies fevers, chills or abnormal weight loss (+) mild fatigue (+) fair po intake, 1 boost per day Eyes: Denies blurriness of vision Ears, nose, mouth, throat, and face: Denies mucositis, epistaxis, or sore throat  Respiratory: Denies cough, dyspnea, or wheezes  Cardiovascular: Denies palpitation, chest discomfort or lower extremity swelling Gastrointestinal:  Denies vomiting, constipation, diarrhea, heartburn or change in bowel habits (+) mild intermittent nausea, controlled with PRN compazine (+) intermittent loose BM, lomotil PRN    Skin: Denies abnormal skin rashes Lymphatics: Denies new lymphadenopathy or easy bruising Neurological:Denies numbness, tingling or new weaknesses Behavioral/Psych: Mood is stable, no new changes  All other systems were reviewed with the patient and are negative  MEDICAL HISTORY:  Past Medical History:  Diagnosis Date  . Anxiety   . Cancer ([facility]    right breast   . Family history of breast cancer   . Family history of colon cancer   . GERD (gastroesophageal reflux disease)   . Headache    couple of migraines in the past  . History of kidney stones   . Vitamin D deficiency     SURGICAL HISTORY: Past Surgical History:  Procedure Laterality Date  . egg donation    . kidney stone removal    . MASTECTOMY W/ SENTINEL NODE BIOPSY Bilateral 07/22/2017   Procedure: RIGHT TOTAL MASTECTOMY WITH  RIGHT AXILLARY SENTINEL LYMPH NODE BIOPSY, LEFT PROPHYLACTIC MASTECTOMY;  Surgeon: WRolm Bookbinder MD;  [place]  Service: General;  Laterality: Bilateral;  . PORTACATH PLACEMENT Right 07/22/2017   Procedure: INSERTION PORT-A-CATH WITH [place]  Surgeon: WRolm Bookbinder MD;  [place]  Service: General;  Laterality: Right;    I have reviewed the social history and family history with the patient and they are unchanged from previous note.  ALLERGIES:  has No Known Allergies.  MEDICATIONS:  Current Outpatient Medications  Medication Sig Dispense Refill  . calcium carbonate (TUMS - DOSED IN MG ELEMENTAL CALCIUM) 500 MG chewable tablet Chew 2 tablets by mouth as needed for indigestion or heartburn.     . carvedilol (COREG) 3.125 MG tablet Take 1 tablet (3.125 mg total) by mouth 2 (two) times daily. 60 tablet 3  . Cholecalciferol (VITAMIN D) 2000 units tablet Take 2,000 Units by mouth daily.    .Marland Kitchendexamethasone (DECADRON) 4 MG tablet Take 2 tablets (8 mg total) by mouth 2 (two) times daily. Start the day before Taxotere. Then  again the day after chemo for 3 days. 30 tablet 1  . diphenoxylate-atropine (LOMOTIL) 2.5-0.025 MG tablet Take 1-2 tablets by mouth 4 (four) times daily as needed for diarrhea or loose stools. 30 tablet 2  . fluticasone (FLONASE) 50 MCG/ACT nasal spray Place 1 spray into both nostrils daily.    Marland Kitchen lidocaine-prilocaine (EMLA) cream Apply to affected area once 30 g 3  . mirtazapine (REMERON) 30 MG tablet Take 1 tablet (30 mg total) by mouth at bedtime. 30 tablet 2  . naproxen sodium (ANAPROX) 220 MG tablet Take 220 mg 2 (two) times daily as needed by mouth (pain).     Marland Kitchen omeprazole (PRILOSEC) 20 MG capsule Take 20 mg by mouth as needed.     . potassium chloride (K-DUR) 10 MEQ tablet Take 2 tablets (20 mEq total) daily by mouth. Take 2 tabs twice daily for 5 days then take 2 tablets daily (Patient taking differently: Take 20 mEq by mouth every other day. ) 60 tablet 1   . loperamide (IMODIUM) 2 MG capsule Take 1 capsule (2 mg total) by mouth 3 (three) times daily as needed for diarrhea or loose stools. (Patient not taking: Reported on 11/21/2017) 30 capsule 0  . prochlorperazine (COMPAZINE) 10 MG tablet Take 1 tablet (10 mg total) by mouth every 6 (six) hours as needed (Nausea or vomiting). (Patient not taking: Reported on 11/21/2017) 30 tablet 1  . promethazine (PHENERGAN) 12.5 MG tablet Take 1 tablet (12.5 mg total) every 6 (six) hours as needed by mouth for nausea or vomiting. (Patient not taking: Reported on 11/21/2017) 30 tablet 2   Current Facility-Administered Medications  Medication Dose Route Frequency Provider Last Rate Last Dose  . gi cocktail (Maalox,Lidocaine,Donnatal)  30 mL Oral Once Harle Stanford., PA-C        PHYSICAL EXAMINATION: ECOG PERFORMANCE STATUS: 2 - Symptomatic, <50% confined to bed  Vitals:   11/21/17 1015  BP: 120/68  Pulse: (!) 120  Resp: 20  Temp: 98.4 F (36.9 C)  SpO2: 100%   Filed Weights   11/21/17 1015  Weight: 171 lb 6.4 oz (77.7 kg)    GENERAL:alert, no distress and comfortable SKIN: skin color, texture, turgor are normal, no rashes or significant lesions EYES: normal, Conjunctiva are pink and non-injected, sclera clear OROPHARYNX:no exudate, no erythema and lips, buccal mucosa, and tongue normal  NECK: supple, thyroid normal size, non-tender, without nodularity LYMPH:  no palpable cervical, supraclavicular, or axillary lymphadenopathy LUNGS: clear to auscultation bilaterally with normal breathing effort HEART: (+) tachycardic. Regular rhythm and no murmurs and no lower extremity edema ABDOMEN:abdomen soft, non-tender and normal bowel sounds. No palpable hepatomegaly Musculoskeletal:no cyanosis of digits and no clubbing  NEURO: alert & oriented x 3 with fluent speech, no focal motor/sensory deficits BREASTS: s/p bilateral mastectomy; incisions well healed.  PAC without erythema   LABORATORY DATA:  I have  reviewed the data as listed CBC Latest Ref Rng & Units 11/21/2017 11/07/2017 10/31/2017  WBC 3.9 - 10.3 10e3/uL 6.4 7.6 8.4  Hemoglobin 11.6 - 15.9 g/dL 9.1(L) 9.3(L) 9.2(L)  Hematocrit 34.8 - 46.6 % 27.5(L) 28.1(L) 29.5(L)  Platelets 145 - 400 10e3/uL 162 164 189     CMP Latest Ref Rng & Units 11/21/2017 11/07/2017 10/31/2017  Glucose 70 - 140 mg/dl 225(H) 127 175(H)  BUN 7.0 - 26.0 mg/dL 10.5 10.4 10.8  Creatinine 0.6 - 1.1 mg/dL 0.9 0.9 0.8  Sodium 136 - 145 mEq/L 139 136 140  Potassium 3.5 - 5.1 mEq/L 3.7  3.6 4.0  Chloride 101 - 111 mmol/L - - -  CO2 22 - 29 mEq/L 23 26 20(L)  Calcium 8.4 - 10.4 mg/dL 9.1 8.7 8.9  Total Protein 6.4 - 8.3 g/dL 6.5 6.0(L) 6.4  Total Bilirubin 0.20 - 1.20 mg/dL 0.40 0.43 0.31  Alkaline Phos 40 - 150 U/L 62 68 62  AST 5 - 34 U/L '23 21 21  ' ALT 0 - 55 U/L 31 21 34   PATHOLOGY  Diagnosis 07/22/17 1. Breast, simple mastectomy, Left - FIBROADENOMA. - NO MALIGNANCY IDENTIFIED. 2. 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 - ADDITIONAL FOCUS OF LOBULAR CARCINOMA, GRADE 2, SPANNING 2.2 CM. - INTERMEDIATE GRADE DUCTAL CARCINOMA IN SITU. - INVASIVE CARCINOMA COMES TO WITHIN 0.2 TO 0.3 CM OF THE DEEP MARGIN, FOCALLY. - TUMOR FOCALLY INVOLVES EPIDERMIS. - LYMPHOVASCULAR INVASION OF DERMAL LYMPHATICS. - ONE OF ONE LYMPH NODES NEGATIVE FOR CARCINOMA (0/1). - SEE ONCOLOGY TABLE. 3. Lymph node, sentinel, biopsy, Right axillary - ONE LYMPH NODE WITH ISOLATED TUMOR CELLS (0/1). 4. Lymph node, sentinel, biopsy, Right - METASTATIC CARCINOMA IN ONE OF ONE LYMPH NODES (1/1). 5. Lymph node, sentinel, biopsy, Right - ONE OF ONE LYMPH NODES NEGATIVE FOR CARCINOMA (0/1). 6. Lymph node, sentinel, biopsy, Right - METASTATIC CARCINOMA IN ONE OF ONE LYMPH NODES (1/1). - EXTRACAPSULAR EXTENSION. 7. Lymph node, sentinel, biopsy, Right - METASTATIC CARCINOMA IN ONE OF ONE LYMPH NODES (1/1). - EXTRACAPSULAR  EXTENSION. 8. Lymph node, sentinel, biopsy, Right - ONE OF ONE LYMPH NODES NEGATIVE FOR CARCINOMA (0/1). 9. Lymph node, sentinel, biopsy, Right - ONE OF ONE LYMPH NODES NEGATIVE FOR CARCINOMA (0/1). 10. Lymph node, sentinel, biopsy, Right - METASTATIC CARCINOMA IN ONE OF ONE LYMPH NODES (1/1). - EXTRACAPSULAR EXTENSION. 11. Lymph node, biopsy, Right axillary - ONE OF ONE LYMPH NODES NEGATIVE FOR CARCINOMA (0/1). Microscopic Comment 2. BREAST, INVASIVE TUMOR Microscopic Comment(continued) Procedure: Bilateral simple mastectomies with right axillary sentinel lymph node biopsies. Laterality: Right. Tumor Size: 5.3 cm. Histologic Type: Mixed lobular and ductal carcinoma. Grade: 2 Tubular Differentiation: 2 Nuclear Pleomorphism: 3 Mitotic Count: 1 Ductal Carcinoma in Situ (DCIS): Present, intermediate grade. Extent of Tumor: Involves epidermis of nipple focally. Margins: Invasive carcinoma, distance from closest margin: 0.2-0.3 cm of posterior margin (smaller mass). DCIS, distance from closest margin: >0.5 cm all margins Regional Lymph Nodes: Number of Lymph Nodes Examined: 10 Number of Sentinel Lymph Nodes Examined: 9 Lymph Nodes with Macrometastases: 4 Lymph Nodes with Micrometastases: 0 Lymph Nodes with Isolated Tumor Cells: 1 Breast Prognostic Profile: Performed on biopsy ([id number]), see below. Will be performed on additional lobular focus. Estrogen Receptor: Positive, 100% strong staining. Progesterone Receptor: Positive, 100% strong staining. Her2: Positive (ratio 2.55). Ki-67: 12%. Best tumor block for sendout testing: 2B (larger focus), 2E (smaller focus). Pathologic Stage Classification (pTNM, AJCC 8th Edition): Primary Tumor (pT): mpT3 Regional Lymph Nodes (pN): pN2a Distant Metastases (pM): pMX 2. FLUORESCENCE IN-SITU HYBRIDIZATION Results: HER2 - NEGATIVE RATIO OF HER2/CEP17 SIGNALS 1.71 AVERAGE HER2 COPY NUMBER PER CELL 1.80 2. PROGNOSTIC  INDICATORS Results: IMMUNOHISTOCHEMICAL AND MORPHOMETRIC ANALYSIS PERFORMED MANUALLY Estrogen Receptor: 95%, POSITIVE, STRONG STAINING INTENSITY Progesterone Receptor: 95%, POSITIVE, STRONG STAINING INTENSITY Proliferation Marker Ki67: 10%  Diagnosis 06/11/17 Breast, right, needle core biopsy, 11:00 o'clock - INVASIVE DUCTAL CARCINOMA, SEE COMMENT. Microscopic Comment The carcinoma appears grade 2. Prognostic markers will be ordered. Dr. Lyndon Code has reviewed the case. The case was called to [facility] on 06/12/2017. Results:  HER2 - **POSITIVE** RATIO OF HER2/CEP17 SIGNALS 2.55 AVERAGE HER2 COPY NUMBER PER CELL 4.85  PROCEDURES  ECHO 08/12/17 Study Conclusions  - Left ventricle: The cavity size was normal. Wall thickness was   normal. Systolic function was normal. The estimated ejection   fraction was in the range of 60% to 65%. Wall motion was normal;   there were no regional wall motion abnormalities. GLS -20.2%.   Left ventricular diastolic function parameters were normal. - Aortic valve: There was no stenosis. - Mitral valve: There was no regurgitation. - Right ventricle: The cavity size was normal. Systolic function   was normal. - Pulmonary arteries: No complete TR doppler jet so unable to   estimate PA systolic pressure. - Inferior vena cava: The vessel was normal in size. The   respirophasic diameter changes were in the normal range (>= 50%),   consistent with normal central venous pressure.  Impressions:  - Normal LV size with EF 60-65%. Normal diastolic function. Strain   as noted above. Normal RV size and systolic function. No   significant valvular abnormalities.  ECHO 11/17/17 Study Conclusions  - Left ventricle: The cavity size was normal. Systolic function was   normal. The estimated ejection fraction was in the range of 60%   to 65%. Wall motion was normal; there were no regional wall   motion abnormalities. - Global longitudinal  strain -13.5%. Unchanged from earlier study   11/17/17 and increased from 08/12/17. Myocardial tracking appears   to be accurate.  RADIOGRAPHIC STUDIES: I have personally reviewed the radiological images as listed and agreed with the findings in the report. No results found.   ASSESSMENT & PLAN: Cristianna Cyr is a 42 y.o. premenopausal female with a history of Vitamin D deficiency and high cholesterol and kidney stones, presented with a palpable right breast mass.   1. Malignant neoplasm of upper-outer quadrant of right breast in female, mixed invasive ductal and lobular carcinoma, pT3(m)N2aM0, stage 1b, ductal carcinoma triple positive, lobular carcinoma ER+/PR+/HER2-,  Grade 2 2. Genetics - negative for all mutations tested 3. Hyperglycemia 4. Social work issues 5. Febrile neutropenia 6. Nausea/diarrhea 7. Hypokalemia 8. Insomnia 9. Nosebleeds 10. Tachycardia  Ms. Westergaard appears stable today. She has completed 4 cycles TCHP. She has recovered well from cycle 4 with some persistent fatigue, mild nausea and periodic loose stool. Labs reviewed, mild anemia Hgb 9.1 may account for increased fatigue but likely multifactorial. She began coreg recently after echo indicated less negative GLS, down to -13.5% from -20.2%, EF remains 60-65%. She remains tachycardic, HR 120. BG significantly increased to 225 with decadron premed, she'd also eaten a brownie prior to labs. She will continue remeron which improves sleep. VS, labs, and physical exam otherwise unremarkable. K is normal, she will hold oral potassium for now, will take 1 tablet daily if she develops diarrhea. For changes on echo and concern for possible early LV systolic dysfunction, Dr. Aundra Dubin recommends holding herceptin for now, will repeat echo on 2/1. Discussed with Dr. Burr Medico who recommends holding perjeta as well for additional risk of decreased LVEF. Pharmacy and infusion RN notified. She will return in 1 week for lab and symptom  management and in 3 weeks for f/u with Dr. Burr Medico and last cycle 6 taxotere/carbo.   PLAN -Labs reviewed, proceed with cycle 5 today, taxotere and carboplatin only; hold herceptin and perjeta per Dr. Aundra Dubin due to change on echo  -Hold oral potassium, will take 1 tablet daily if diarrhea develops  -Return  in 1 week for lab, NP f/u for symptom management and IVF -Return in 3 weeks for f/u with Dr. Burr Medico prior to final cycle 6 TC chemo only   All questions were answered. The patient knows to call the clinic with any problems, questions or concerns. No barriers to learning was detected.     [name], NP 11/21/17

## 2017-11-21 NOTE — Patient Instructions (Signed)
Implanted Port Home Guide An implanted port is a type of central line that is placed under the skin. Central lines are used to provide IV access when treatment or nutrition needs to be given through a person's veins. Implanted ports are used for long-term IV access. An implanted port may be placed because:  You need IV medicine that would be irritating to the small veins in your hands or arms.  You need long-term IV medicines, such as antibiotics.  You need IV nutrition for a long period.  You need frequent blood draws for lab tests.  You need dialysis.  Implanted ports are usually placed in the chest area, but they can also be placed in the upper arm, the abdomen, or the leg. An implanted port has two main parts:  Reservoir. The reservoir is round and will appear as a small, raised area under your skin. The reservoir is the part where a needle is inserted to give medicines or draw blood.  Catheter. The catheter is a thin, flexible tube that extends from the reservoir. The catheter is placed into a large vein. Medicine that is inserted into the reservoir goes into the catheter and then into the vein.  How will I care for my incision site? Do not get the incision site wet. Bathe or shower as directed by your health care provider. How is my port accessed? Special steps must be taken to access the port:  Before the port is accessed, a numbing cream can be placed on the skin. This helps numb the skin over the port site.  Your health care provider uses a sterile technique to access the port. ? Your health care provider must put on a mask and sterile gloves. ? The skin over your port is cleaned carefully with an antiseptic and allowed to dry. ? The port is gently pinched between sterile gloves, and a needle is inserted into the port.  Only "non-coring" port needles should be used to access the port. Once the port is accessed, a blood return should be checked. This helps ensure that the port  is in the vein and is not clogged.  If your port needs to remain accessed for a constant infusion, a clear (transparent) bandage will be placed over the needle site. The bandage and needle will need to be changed every week, or as directed by your health care provider.  Keep the bandage covering the needle clean and dry. Do not get it wet. Follow your health care provider's instructions on how to take a shower or bath while the port is accessed.  If your port does not need to stay accessed, no bandage is needed over the port.  What is flushing? Flushing helps keep the port from getting clogged. Follow your health care provider's instructions on how and when to flush the port. Ports are usually flushed with saline solution or a medicine called heparin. The need for flushing will depend on how the port is used.  If the port is used for intermittent medicines or blood draws, the port will need to be flushed: ? After medicines have been given. ? After blood has been drawn. ? As part of routine maintenance.  If a constant infusion is running, the port may not need to be flushed.  How long will my port stay implanted? The port can stay in for as long as your health care provider thinks it is needed. When it is time for the port to come out, surgery will be   done to remove it. The procedure is similar to the one performed when the port was put in. When should I seek immediate medical care? When you have an implanted port, you should seek immediate medical care if:  You notice a bad smell coming from the incision site.  You have swelling, redness, or drainage at the incision site.  You have more swelling or pain at the port site or the surrounding area.  You have a fever that is not controlled with medicine.  This information is not intended to replace advice given to you by your health care provider. Make sure you discuss any questions you have with your health care provider. Document  Released: 11/04/2005 Document Revised: 04/11/2016 Document Reviewed: 07/12/2013 [employer] Interactive Patient Education  2017 [employer]  

## 2017-11-24 ENCOUNTER — Telehealth: Payer: Self-pay | Admitting: Hematology

## 2017-11-24 NOTE — Telephone Encounter (Signed)
Added additional appointments for 1/25 and 2/1 per 1/4 los. Spoke with patient she is aware.

## 2017-11-28 ENCOUNTER — Inpatient Hospital Stay: Payer: PRIVATE HEALTH INSURANCE

## 2017-11-28 ENCOUNTER — Encounter: Payer: Self-pay | Admitting: *Deleted

## 2017-11-28 ENCOUNTER — Inpatient Hospital Stay (HOSPITAL_BASED_OUTPATIENT_CLINIC_OR_DEPARTMENT_OTHER): Payer: PRIVATE HEALTH INSURANCE | Admitting: Nurse Practitioner

## 2017-11-28 ENCOUNTER — Encounter: Payer: Self-pay | Admitting: Nurse Practitioner

## 2017-11-28 VITALS — BP 109/68 | HR 115 | Temp 98.3°F | Resp 18 | Ht 66.0 in | Wt 171.0 lb

## 2017-11-28 DIAGNOSIS — R232 Flushing: Secondary | ICD-10-CM | POA: Diagnosis not present

## 2017-11-28 DIAGNOSIS — Z95828 Presence of other vascular implants and grafts: Secondary | ICD-10-CM

## 2017-11-28 DIAGNOSIS — R739 Hyperglycemia, unspecified: Secondary | ICD-10-CM

## 2017-11-28 DIAGNOSIS — Z170 Estrogen receptor positive status [ER+]: Principal | ICD-10-CM

## 2017-11-28 DIAGNOSIS — R5081 Fever presenting with conditions classified elsewhere: Secondary | ICD-10-CM

## 2017-11-28 DIAGNOSIS — E876 Hypokalemia: Secondary | ICD-10-CM

## 2017-11-28 DIAGNOSIS — Z9011 Acquired absence of right breast and nipple: Secondary | ICD-10-CM

## 2017-11-28 DIAGNOSIS — R110 Nausea: Secondary | ICD-10-CM | POA: Diagnosis not present

## 2017-11-28 DIAGNOSIS — R5383 Other fatigue: Secondary | ICD-10-CM | POA: Diagnosis not present

## 2017-11-28 DIAGNOSIS — C50411 Malignant neoplasm of upper-outer quadrant of right female breast: Secondary | ICD-10-CM

## 2017-11-28 DIAGNOSIS — M5137 Other intervertebral disc degeneration, lumbosacral region: Secondary | ICD-10-CM

## 2017-11-28 DIAGNOSIS — G4700 Insomnia, unspecified: Secondary | ICD-10-CM | POA: Diagnosis not present

## 2017-11-28 DIAGNOSIS — E559 Vitamin D deficiency, unspecified: Secondary | ICD-10-CM | POA: Diagnosis not present

## 2017-11-28 DIAGNOSIS — R12 Heartburn: Secondary | ICD-10-CM | POA: Diagnosis not present

## 2017-11-28 DIAGNOSIS — R000 Tachycardia, unspecified: Secondary | ICD-10-CM

## 2017-11-28 DIAGNOSIS — M549 Dorsalgia, unspecified: Secondary | ICD-10-CM

## 2017-11-28 DIAGNOSIS — Z803 Family history of malignant neoplasm of breast: Secondary | ICD-10-CM

## 2017-11-28 DIAGNOSIS — Z5111 Encounter for antineoplastic chemotherapy: Secondary | ICD-10-CM | POA: Diagnosis not present

## 2017-11-28 DIAGNOSIS — R197 Diarrhea, unspecified: Secondary | ICD-10-CM | POA: Diagnosis not present

## 2017-11-28 DIAGNOSIS — E7800 Pure hypercholesterolemia, unspecified: Secondary | ICD-10-CM | POA: Diagnosis not present

## 2017-11-28 DIAGNOSIS — Z7689 Persons encountering health services in other specified circumstances: Secondary | ICD-10-CM | POA: Diagnosis not present

## 2017-11-28 DIAGNOSIS — K219 Gastro-esophageal reflux disease without esophagitis: Secondary | ICD-10-CM

## 2017-11-28 DIAGNOSIS — K5900 Constipation, unspecified: Secondary | ICD-10-CM

## 2017-11-28 DIAGNOSIS — Z79899 Other long term (current) drug therapy: Secondary | ICD-10-CM

## 2017-11-28 DIAGNOSIS — F418 Other specified anxiety disorders: Secondary | ICD-10-CM

## 2017-11-28 DIAGNOSIS — Z87442 Personal history of urinary calculi: Secondary | ICD-10-CM

## 2017-11-28 LAB — COMPREHENSIVE METABOLIC PANEL
ALK PHOS: 77 U/L (ref 40–150)
ALT: 20 U/L (ref 0–55)
ANION GAP: 10 (ref 3–11)
AST: 16 U/L (ref 5–34)
Albumin: 3.6 g/dL (ref 3.5–5.0)
BUN: 14 mg/dL (ref 7–26)
CALCIUM: 8.8 mg/dL (ref 8.4–10.4)
CO2: 23 mmol/L (ref 22–29)
Chloride: 106 mmol/L (ref 98–109)
Creatinine, Ser: 0.86 mg/dL (ref 0.60–1.10)
GFR calc non Af Amer: >60 mL/min (ref >60)
Glucose, Bld: 98 mg/dL (ref 70–140)
Potassium: 3.6 mmol/L (ref 3.3–4.7)
SODIUM: 139 mmol/L (ref 136–145)
Total Bilirubin: 0.4 mg/dL (ref 0.2–1.2)
Total Protein: 6.2 g/dL — ABNORMAL LOW (ref 6.4–8.3)

## 2017-11-28 LAB — CBC WITH DIFFERENTIAL/PLATELET
Basophils Absolute: 0 10*3/uL (ref 0.0–0.1)
Basophils Relative: 1 %
EOS ABS: 0 10*3/uL (ref 0.0–0.5)
EOS PCT: 0 %
HCT: 27.5 % — ABNORMAL LOW (ref 34.8–46.6)
HEMOGLOBIN: 9 g/dL — AB (ref 11.6–15.9)
Lymphocytes Relative: 27 %
Lymphs Abs: 1.4 10*3/uL (ref 0.9–3.3)
MCH: 30.9 pg (ref 25.1–34.0)
MCHC: 32.8 g/dL (ref 31.5–36.0)
MCV: 94.2 fL (ref 79.5–101.0)
MONO ABS: 0.9 10*3/uL (ref 0.1–0.9)
MONOS PCT: 16 %
Neutro Abs: 2.9 10*3/uL (ref 1.5–6.5)
Neutrophils Relative %: 56 %
PLATELETS: 135 10*3/uL — AB (ref 145–400)
RBC: 2.92 MIL/uL — ABNORMAL LOW (ref 3.70–5.45)
RDW: 18.5 % — AB (ref 11.2–16.1)
WBC: 5.2 10*3/uL (ref 3.9–10.3)

## 2017-11-28 MED ORDER — ONDANSETRON HCL 4 MG/2ML IJ SOLN
8.0000 mg | Freq: Once | INTRAMUSCULAR | Status: AC
  Administered 2017-11-28: 8 mg via INTRAVENOUS

## 2017-11-28 MED ORDER — SODIUM CHLORIDE 0.9% FLUSH
10.0000 mL | Freq: Once | INTRAVENOUS | Status: AC
  Administered 2017-11-28: 10 mL
  Filled 2017-11-28: qty 10

## 2017-11-28 MED ORDER — SODIUM CHLORIDE 0.9 % IV SOLN: Freq: Once | INTRAVENOUS | Status: DC | PRN

## 2017-11-28 MED ORDER — ONDANSETRON HCL 4 MG/2ML IJ SOLN
INTRAMUSCULAR | Status: AC
  Filled 2017-11-28: qty 4

## 2017-11-28 MED ORDER — SODIUM CHLORIDE 0.9% FLUSH
10.0000 mL | INTRAVENOUS | Status: DC | PRN
  Administered 2017-11-28: 10 mL
  Filled 2017-11-28: qty 10

## 2017-11-28 MED ORDER — LORAZEPAM 0.5 MG PO TABS: 0.5000 mg | ORAL_TABLET | Freq: Three times a day (TID) | ORAL | 0 refills | Status: DC | PRN

## 2017-11-28 MED ORDER — SODIUM CHLORIDE 0.9 % IV SOLN
Freq: Once | INTRAVENOUS | Status: AC
  Administered 2017-11-28: 10:00:00 via INTRAVENOUS

## 2017-11-28 MED ORDER — HEPARIN SOD (PORK) LOCK FLUSH 100 UNIT/ML IV SOLN
500.0000 [IU] | Freq: Once | INTRAVENOUS | Status: AC | PRN
  Administered 2017-11-28: 500 [IU]
  Filled 2017-11-28: qty 5

## 2017-11-28 NOTE — Patient Instructions (Signed)
Dehydration, Adult Dehydration is when there is not enough fluid or water in your body. This happens when you lose more fluids than you take in. Dehydration can range from mild to very bad. It should be treated right away to keep it from getting very bad. Symptoms of mild dehydration may include:  Thirst.  Dry lips.  Slightly dry mouth.  Dry, warm skin.  Dizziness. Symptoms of moderate dehydration may include:  Very dry mouth.  Muscle cramps.  Dark pee (urine). Pee may be the color of tea.  Your body making less pee.  Your eyes making fewer tears.  Heartbeat that is uneven or faster than normal (palpitations).  Headache.  Light-headedness, especially when you stand up from sitting.  Fainting (syncope). Symptoms of very bad dehydration may include:  Changes in skin, such as: ? Cold and clammy skin. ? Blotchy (mottled) or pale skin. ? Skin that does not quickly return to normal after being lightly pinched and let go (poor skin turgor).  Changes in body fluids, such as: ? Feeling very thirsty. ? Your eyes making fewer tears. ? Not sweating when body temperature is high, such as in hot weather. ? Your body making very little pee.  Changes in vital signs, such as: ? Weak pulse. ? Pulse that is more than 100 beats a minute when you are sitting still. ? Fast breathing. ? Low blood pressure.  Other changes, such as: ? Sunken eyes. ? Cold hands and feet. ? Confusion. ? Lack of energy (lethargy). ? Trouble waking up from sleep. ? Short-term weight loss. ? Unconsciousness. Follow these instructions at home:  If told by your doctor, drink an ORS: ? Make an ORS by using instructions on the package. ? Start by drinking small amounts, about  cup (120 mL) every 5-10 minutes. ? Slowly drink more until you have had the amount that your doctor said to have.  Drink enough clear fluid to keep your pee clear or pale yellow. If you were told to drink an ORS, finish the ORS  first, then start slowly drinking clear fluids. Drink fluids such as: ? Water. Do not drink only water by itself. Doing that can make the salt (sodium) level in your body get too low (hyponatremia). ? Ice chips. ? Fruit juice that you have added water to (diluted). ? Low-calorie sports drinks.  Avoid: ? Alcohol. ? Drinks that have a lot of sugar. These include high-calorie sports drinks, fruit juice that does not have water added, and soda. ? Caffeine. ? Foods that are greasy or have a lot of fat or sugar.  Take over-the-counter and prescription medicines only as told by your doctor.  Do not take salt tablets. Doing that can make the salt level in your body get too high (hypernatremia).  Eat foods that have minerals (electrolytes). Examples include bananas, oranges, potatoes, tomatoes, and spinach.  Keep all follow-up visits as told by your doctor. This is important. Contact a doctor if:  You have belly (abdominal) pain that: ? Gets worse. ? Stays in one area (localizes).  You have a rash.  You have a stiff neck.  You get angry or annoyed more easily than normal (irritability).  You are more sleepy than normal.  You have a harder time waking up than normal.  You feel: ? Weak. ? Dizzy. ? Very thirsty.  You have peed (urinated) only a small amount of very dark pee during 6-8 hours. Get help right away if:  You have symptoms of   very bad dehydration.  You cannot drink fluids without throwing up (vomiting).  Your symptoms get worse with treatment.  You have a fever.  You have a very bad headache.  You are throwing up or having watery poop (diarrhea) and it: ? Gets worse. ? Does not go away.  You have blood or something green (bile) in your throw-up.  You have blood in your poop (stool). This may cause poop to look black and tarry.  You have not peed in 6-8 hours.  You pass out (faint).  Your heart rate when you are sitting still is more than 100 beats a  minute.  You have trouble breathing. This information is not intended to replace advice given to you by your health care provider. Make sure you discuss any questions you have with your health care provider. Document Released: 08/31/2009 Document Revised: 05/24/2016 Document Reviewed: 12/29/2015 Elsevier Interactive Patient Education  2018 [employer]  

## 2017-11-28 NOTE — Progress Notes (Signed)
[place]  [phone] [phone]  Clinic Follow up Note   Patient Care Team: [name], MD as PCP - General (Family Medicine) [name] (Family Medicine) [name], MD as Consulting Physician (General Surgery) [name], MD as Consulting Physician (Hematology) Kyung Rudd, MD as Consulting Physician (Radiation Oncology) 11/28/2017  CHIEF COMPLAINT:  F/u R breast cancer   SUMMARY OF ONCOLOGIC HISTORY: Oncology History    Cancer Staging Malignant neoplasm of upper-outer quadrant of right breast in female, estrogen receptor positive ([place]) Staging form: Breast, AJCC 8th Edition - Clinical stage from 06/11/2017: Stage IB (cT3, cN0, cM0, G2, ER: Positive, PR: Positive, HER2: Positive) - Signed by [name], MD on 06/16/2017 - Pathologic stage from 07/22/2017: Stage IB (pT3(m), pN2a(sn), cM0, G2, ER: Positive, PR: Positive, HER2: Positive) - Signed by [name], NP on 08/04/2017      Malignant neoplasm of upper-outer quadrant of right breast in female, estrogen receptor positive ([place])   06/11/2017 Initial Biopsy    Diagnosis 06/11/17 Breast, right, needle core biopsy, 11:00 o'clock - INVASIVE DUCTAL CARCINOMA, G2      06/11/2017 Mammogram    [name] and MM Diagnostic Breast Tomo Bilateral 06/11/17 IMPRESSION: 1. Highly suspicious mass within the retroareolar right breast, extending from the 8:00 to 11:00 axes, anterior to posterior depth, extending anteriorly to the nipple with associated nipple retraction, measuring at least 5.5 cm by ultrasound  2. No sonographic evidence of metastatic lymphadenopathy in the right axilla. 3. No evidence of malignancy within the left breast.       06/11/2017 Initial Diagnosis    Malignant neoplasm of upper-outer quadrant of right breast in female, estrogen receptor positive ([place])      06/11/2017 Receptors her2    Estrogen Receptor: 100%, POSITIVE, STRONG STAINING  INTENSITY Progesterone Receptor: 100%, POSITIVE, STRONG STAINING INTENSITY Proliferation Marker Ki67: 12%Proliferation Marker Ki67: 12%  HER2 - **POSITIVE** RATIO OF HER2/CEP17 SIGNALS 2.55 AVERAGE HER2 COPY NUMBER PER CELL 4.85      06/25/2017 Genetic Testing    Negative genetic testing on the 9 gene STAT panel.  The STAT Breast cancer panel offered by [facility] includes sequencing and rearrangement analysis for the following 9 genes:  ATM, BRCA1, BRCA2, CDH1, CHEK2, PALB2, PTEN, STK11 and TP53.   The report date is June 25, 2017.  Negative genetic testing on the common hereditary cancer panel.  The Hereditary Gene Panel offered by [facility] includes sequencing and/or deletion duplication testing of the following 46 genes: APC, ATM, AXIN2, BARD1, BMPR1A, BRCA1, BRCA2, BRIP1, CDH1, CDKN2A (p14ARF), CDKN2A (p16INK4a), CHEK2, CTNNA1, DICER1, EPCAM (Deletion/duplication testing only), GREM1 (promoter region deletion/duplication testing only), KIT, MEN1, MLH1, MSH2, MSH3, MSH6, MUTYH, NBN, NF1, NHTL1, PALB2, PDGFRA, PMS2, POLD1, POLE, PTEN, RAD50, RAD51C, RAD51D, SDHB, SDHC, SDHD, SMAD4, SMARCA4. STK11, TP53, TSC1, TSC2, and VHL.  The following genes were evaluated for sequence changes only: SDHA and HOXB13 c.251G>A variant only.  The report date is June 25, 2017.       07/02/2017 Imaging    CT cAP 07/02/17 IMPRESSION: 1. Subareolar right breast mass. No compelling findings of nodal or metastatic involvement. 2. Old granulomatous disease. 3. There is a 7 mm enhancing or hyperdense focus posteriorly in segment 7 of the liver in the subcapsular region. By virtue of its small size this lesion is technically nonspecific although statistically likely to be a small benign lesions such as flash filling hemangioma. This may warrant surveillance. 4.  Prominent stool throughout the colon favors constipation.  5. Degenerative disc disease and spondylosis at L5-S1 likely causing mild impingement.       07/02/2017 Imaging    Bone scan 07/02/17 IMPRESSION: Today' s exam is negative. I ascribed the tiny focus of activity just proximal to the left antecubital region to injection site.      07/22/2017 Surgery    RIGHT TOTAL MASTECTOMY WITH RIGHT AXILLARY SENTINEL LYMPH NODE BIOPSY, LEFT PROPHYLACTIC MASTECTOM and INSERTION PORT-A-CATH WITH [name] by [name] and [facility]       07/22/2017 Pathology Results    Diagnosis 1. Breast, simple mastectomy, Left - FIBROADENOMA. - NO MALIGNANCY IDENTIFIED. 2. 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 - ADDITIONAL FOCUS OF LOBULAR CARCINOMA, GRADE 2, SPANNING 2.2 CM. - INTERMEDIATE GRADE DUCTAL CARCINOMA IN SITU. - INVASIVE CARCINOMA COMES TO WITHIN 0.2 TO 0.3 CM OF THE DEEP MARGIN, FOCALLY. - TUMOR FOCALLY INVOLVES EPIDERMIS. - LYMPHOVASCULAR INVASION OF DERMAL LYMPHATICS. - ONE OF ONE LYMPH NODES NEGATIVE FOR CARCINOMA (0/1). - SEE ONCOLOGY TABLE. 3. Lymph node, sentinel, biopsy, Right axillary - ONE LYMPH NODE WITH ISOLATED TUMOR CELLS (0/1). 4. Lymph node, sentinel, biopsy, Right - METASTATIC CARCINOMA IN ONE OF ONE LYMPH NODES (1/1). 5. Lymph node, sentinel, biopsy, Right - ONE OF ONE LYMPH NODES NEGATIVE FOR CARCINOMA (0/1). 6. Lymph node, sentinel, biopsy, Right - METASTATIC CARCINOMA IN ONE OF ONE LYMPH NODES (1/1). - EXTRACAPSULAR EXTENSION. 7. Lymph node, sentinel, biopsy, Right - METASTATIC CARCINOMA IN ONE OF ONE LYMPH NODES (1/1). - EXTRACAPSULAR EXTENSION. 8. Lymph node, sentinel, biopsy, Right - ONE OF ONE LYMPH NODES NEGATIVE FOR CARCINOMA (0/1). 9. Lymph node, sentinel, biopsy, Right - ONE OF ONE LYMPH NODES NEGATIVE FOR CARCINOMA (0/1). 10. Lymph node, sentinel, biopsy, Right - METASTATIC CARCINOMA IN ONE OF ONE LYMPH NODES (1/1). - EXTRACAPSULAR EXTENSION. 11. Lymph node, biopsy, Right axillary - ONE OF ONE LYMPH NODES NEGATIVE FOR CARCINOMA (0/1).        07/22/2017 Receptors her2    Ductal carcinoma in primary breast tumor and node metastasis are ER 95%, PR 95% strongly positive, and HER2 + The lobular component in primary breast tumor is ER 95% positive, PR 95% positive, strong staining, HER-2 negative.       08/25/2017 Imaging    MRI abdomen done at [facility]:  Enhancing structure measing 5 mm is compatible with a flash filling hemangioma. No specific findings to suggest metastatic cancer      08/25/2017 Imaging    MRI Abdomen at [facility] 08/25/17  IMPRESSION: 1. Lesion in segment 7 of the liver has signal and enhancement characteristics compatible with a flash filling hemangioma. No specific findings identified to suggest metastatic disease.      08/29/2017 -  Chemotherapy    adjuvant TCHP every 3 weeks for 6 cycles starting 08/29/17, followed by maintenance Herceptin with or without Perjeta for 6-12 months. Postponed and decreased Carbo to 652m and added Onpro with cycle 2 on 09/19/17 due to neutropenic fever. Carbplatin further reduced to 5070mstarting with cycle 3 due to thrombocytopenia.          09/04/2017 - 09/09/2017 Hospital Admission    Admit date: 09/04/2017 Discharge date: 09/09/2017  DISCHARGE DIAGNOSES:  Principal Problem:   [facility]is ([facility]:   Malignant neoplasm of upper-outer quadrant of right breast in female, estrogen receptor positive ([place]  Port-A-Cath in place   Neutropenic fever ([facility]  Hyponatremia   Malignant neoplasm of breast in female, estrogen  receptor positive ([place])      CURRENT THERAPY:  adjuvant chemotherapy TCHP every 3 weeks for 6 cycles beginning 08/29/2017. Decrease Carbo to 625m and added Onpro with cycle 2 on 09/19/17 due to neutropenic fever. Carboplatin further reduced to AUC 4.5 starting with cycle 3 due to thrombocytopenia.  Hold Herceptin/Perjeta from cycle 5 on 11/21/17 due to GLS change from -20.2% to -13.5% on 11/17/17 echo, concern for possible drop in LV systolic  function  INTERVAL HISTORY: Ms. LRylanderreturns today for follow-up as scheduled 1 week following cycle 5 TC with Neulasta on 11/21/2017.  Has been eating and drinking well this week, one boost daily.  Has moderate fatigue but able to leave the house as needed.  She reports intermittent nausea for 2 weeks following chemo, the last few days she has needed Compazine every 6 hours.  She has not vomited.  Zofran and Phenergan do not work for her; Compazine only with mild relief.  The Remeron is working well for her appetite and depression, it does not help her sleep.  She only slept 1 hour last night.  Trouble "turning off her mind" Benadryl gives her restless leg, melatonin does not work, LPhysiological scientistgave her a morning hangover.  She had one small BM this week, uses generic stool softener as needed; no diarrhea.  She reports occasional back soreness with prolonged sitting; otherwise no bone pain with Neulasta.  She does however report feeling better in general from previous cycles.  REVIEW OF SYSTEMS:   Constitutional: Denies fevers, chills or abnormal weight loss (+) moderate fatigue (+) good appetite, improved with remeron (+) 1 boost per day Eyes: Denies blurriness of vision Ears, nose, mouth, throat, and face: Denies mucositis, epistaxis, or sore throat (+) nasal congestion/allergies, uses Flonase Respiratory: Denies cough, dyspnea, or wheezes  Cardiovascular: Denies palpitation, chest discomfort or lower extremity swelling Gastrointestinal:  Denies vomiting,  diarrhea, heartburn or change in bowel habits (+) mild intermittent nausea; requires Compazine every 6 with only mild relief  Skin: Denies abnormal skin rashes Lymphatics: Denies new lymphadenopathy or easy bruising Neurological:Denies numbness, tingling or new weaknesses Behavioral/Psych: Mood is stable, no new changes (+) depression, improved with Remeron; depressed mood worsens when not feeling well after chemo (+) mild anxiety All other systems  were reviewed with the patient and are negative  MEDICAL HISTORY:  Past Medical History:  Diagnosis Date  . Anxiety   . Cancer ([facility]    right breast   . Family history of breast cancer   . Family history of colon cancer   . GERD (gastroesophageal reflux disease)   . Headache    couple of migraines in the past  . History of kidney stones   . Vitamin D deficiency     SURGICAL HISTORY: Past Surgical History:  Procedure Laterality Date  . egg donation    . kidney stone removal    . MASTECTOMY W/ SENTINEL NODE BIOPSY Bilateral 07/22/2017   Procedure: RIGHT TOTAL MASTECTOMY WITH RIGHT AXILLARY SENTINEL LYMPH NODE BIOPSY, LEFT PROPHYLACTIC MASTECTOMY;  Surgeon: WRolm Bookbinder MD;  [place]  Service: General;  Laterality: Bilateral;  . PORTACATH PLACEMENT Right 07/22/2017   Procedure: INSERTION PORT-A-CATH WITH [place]  Surgeon: WRolm Bookbinder MD;  [place]  Service: General;  Laterality: Right;    I have reviewed the social history and family history with the patient and they are unchanged from previous note.  ALLERGIES:  has No Known Allergies.  MEDICATIONS:  Current Outpatient Medications  Medication  Sig Dispense Refill  . carvedilol (COREG) 3.125 MG tablet Take 1 tablet (3.125 mg total) by mouth 2 (two) times daily. 60 tablet 3  . dexamethasone (DECADRON) 4 MG tablet Take 2 tablets (8 mg total) by mouth 2 (two) times daily. Start the day before Taxotere. Then again the day after chemo for 3 days. 30 tablet 1  . diphenoxylate-atropine (LOMOTIL) 2.5-0.025 MG tablet Take 1-2 tablets by mouth 4 (four) times daily as needed for diarrhea or loose stools. 30 tablet 2  . fluticasone (FLONASE) 50 MCG/ACT nasal spray Place 1 spray into both nostrils daily.    Marland Kitchen lidocaine-prilocaine (EMLA) cream Apply to affected area once 30 g 3  . loperamide (IMODIUM) 2 MG capsule Take 1 capsule (2 mg total) by mouth 3 (three) times daily as needed for diarrhea or loose stools. 30 capsule  0  . mirtazapine (REMERON) 30 MG tablet Take 1 tablet (30 mg total) by mouth at bedtime. 30 tablet 2  . naproxen sodium (ANAPROX) 220 MG tablet Take 220 mg 2 (two) times daily as needed by mouth (pain).     . prochlorperazine (COMPAZINE) 10 MG tablet Take 1 tablet (10 mg total) by mouth every 6 (six) hours as needed (Nausea or vomiting). 30 tablet 1  . calcium carbonate (TUMS - DOSED IN MG ELEMENTAL CALCIUM) 500 MG chewable tablet Chew 2 tablets by mouth as needed for indigestion or heartburn.     . Cholecalciferol (VITAMIN D) 2000 units tablet Take 2,000 Units by mouth daily.    Marland Kitchen LORazepam (ATIVAN) 0.5 MG tablet Take 1 tablet (0.5 mg total) by mouth every 8 (eight) hours as needed for anxiety or sleep (or nausea). 30 tablet 0  . omeprazole (PRILOSEC) 20 MG capsule Take 20 mg by mouth as needed.     . potassium chloride (K-DUR) 10 MEQ tablet Take 2 tablets (20 mEq total) daily by mouth. Take 2 tabs twice daily for 5 days then take 2 tablets daily (Patient taking differently: Take 20 mEq by mouth every other day. ) 60 tablet 1  . promethazine (PHENERGAN) 12.5 MG tablet Take 1 tablet (12.5 mg total) every 6 (six) hours as needed by mouth for nausea or vomiting. 30 tablet 2   Current Facility-Administered Medications  Medication Dose Route Frequency Provider Last Rate Last Dose  . gi cocktail (Maalox,Lidocaine,Donnatal)  30 mL Oral Once Harle Stanford., PA-C       Facility-Administered Medications Ordered in Other Visits  Medication Dose Route Frequency Provider Last Rate Last Dose  . sodium chloride flush (NS) 0.9 % injection 10 mL  10 mL Intracatheter PRN [name], NP   10 mL at 11/28/17 1158    PHYSICAL EXAMINATION: ECOG PERFORMANCE STATUS: 2 - Symptomatic, <50% confined to bed  Vitals:   11/28/17 0908  BP: 109/68  Pulse: (!) 115  Resp: 18  Temp: 98.3 F (36.8 C)  SpO2: 100%   Filed Weights   11/28/17 0908  Weight: 171 lb (77.6 kg)    GENERAL:alert, no distress and  comfortable SKIN: skin color, texture, turgor are normal, no rashes or significant lesions EYES: normal, conjunctiva are pink and non-injected, sclera clear OROPHARYNX:no exudate, no erythema and lips, buccal mucosa, and tongue normal  NECK: supple, thyroid normal size, non-tender, without nodularity LYMPH:  no palpable cervical, supraclavicular, or axillary lymphadenopathy LUNGS: clear to auscultation bilaterally with normal breathing effort HEART: (+) tachycardic. Regular rhythm and no murmurs and no lower extremity edema ABDOMEN:abdomen soft, non-tender and normal  bowel sounds. No palpable hepatomegaly Musculoskeletal:no cyanosis of digits and no clubbing  NEURO: alert & oriented x 3 with fluent speech, no focal motor/sensory deficits BREASTS: s/p bilateral mastectomies; incisions well healed.  PAC without erythema   LABORATORY DATA:  I have reviewed the data as listed CBC Latest Ref Rng & Units 11/28/2017 11/21/2017 11/07/2017  WBC 3.9 - 10.3 K/uL 5.2 6.4 7.6  Hemoglobin 11.6 - 15.9 g/dL 9.0(L) 9.1(L) 9.3(L)  Hematocrit 34.8 - 46.6 % 27.5(L) 27.5(L) 28.1(L)  Platelets 145 - 400 K/uL 135(L) 162 164     CMP Latest Ref Rng & Units 11/28/2017 11/21/2017 11/07/2017  Glucose 70 - 140 mg/dL 98 225(H) 127  BUN 7 - 26 mg/dL 14 10.5 10.4  Creatinine 0.60 - 1.10 mg/dL 0.86 0.9 0.9  Sodium 136 - 145 mmol/L 139 139 136  Potassium 3.3 - 4.7 mmol/L 3.6 3.7 3.6  Chloride 98 - 109 mmol/L 106 - -  CO2 22 - 29 mmol/L '23 23 26  ' Calcium 8.4 - 10.4 mg/dL 8.8 9.1 8.7  Total Protein 6.4 - 8.3 g/dL 6.2(L) 6.5 6.0(L)  Total Bilirubin 0.2 - 1.2 mg/dL 0.4 0.40 0.43  Alkaline Phos 40 - 150 U/L 77 62 68  AST 5 - 34 U/L '16 23 21  ' ALT 0 - 55 U/L '20 31 21   ' PATHOLOGY  Diagnosis 07/22/17 1. Breast, simple mastectomy, Left - FIBROADENOMA. - NO MALIGNANCY IDENTIFIED. 2. 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 - ADDITIONAL FOCUS OF LOBULAR  CARCINOMA, GRADE 2, SPANNING 2.2 CM. - INTERMEDIATE GRADE DUCTAL CARCINOMA IN SITU. - INVASIVE CARCINOMA COMES TO WITHIN 0.2 TO 0.3 CM OF THE DEEP MARGIN, FOCALLY. - TUMOR FOCALLY INVOLVES EPIDERMIS. - LYMPHOVASCULAR INVASION OF DERMAL LYMPHATICS. - ONE OF ONE LYMPH NODES NEGATIVE FOR CARCINOMA (0/1). - SEE ONCOLOGY TABLE. 3. Lymph node, sentinel, biopsy, Right axillary - ONE LYMPH NODE WITH ISOLATED TUMOR CELLS (0/1). 4. Lymph node, sentinel, biopsy, Right - METASTATIC CARCINOMA IN ONE OF ONE LYMPH NODES (1/1). 5. Lymph node, sentinel, biopsy, Right - ONE OF ONE LYMPH NODES NEGATIVE FOR CARCINOMA (0/1). 6. Lymph node, sentinel, biopsy, Right - METASTATIC CARCINOMA IN ONE OF ONE LYMPH NODES (1/1). - EXTRACAPSULAR EXTENSION. 7. Lymph node, sentinel, biopsy, Right - METASTATIC CARCINOMA IN ONE OF ONE LYMPH NODES (1/1). - EXTRACAPSULAR EXTENSION. 8. Lymph node, sentinel, biopsy, Right - ONE OF ONE LYMPH NODES NEGATIVE FOR CARCINOMA (0/1). 9. Lymph node, sentinel, biopsy, Right - ONE OF ONE LYMPH NODES NEGATIVE FOR CARCINOMA (0/1). 10. Lymph node, sentinel, biopsy, Right - METASTATIC CARCINOMA IN ONE OF ONE LYMPH NODES (1/1). - EXTRACAPSULAR EXTENSION. 11. Lymph node, biopsy, Right axillary - ONE OF ONE LYMPH NODES NEGATIVE FOR CARCINOMA (0/1). Microscopic Comment 2. BREAST, INVASIVE TUMOR Microscopic Comment(continued) Procedure: Bilateral simple mastectomies with right axillary sentinel lymph node biopsies. Laterality: Right. Tumor Size: 5.3 cm. Histologic Type: Mixed lobular and ductal carcinoma. Grade: 2 Tubular Differentiation: 2 Nuclear Pleomorphism: 3 Mitotic Count: 1 Ductal Carcinoma in Situ (DCIS): Present, intermediate grade. Extent of Tumor: Involves epidermis of nipple focally. Margins: Invasive carcinoma, distance from closest margin: 0.2-0.3 cm of posterior margin (smaller mass). DCIS, distance from closest margin: >0.5 cm all margins Regional Lymph  Nodes: Number of Lymph Nodes Examined: 10 Number of Sentinel Lymph Nodes Examined: 9 Lymph Nodes with Macrometastases: 4 Lymph Nodes with Micrometastases: 0 Lymph Nodes with Isolated Tumor Cells: 1 Breast Prognostic Profile: Performed on biopsy ([id number]), see below. Will be  performed on additional lobular focus. Estrogen Receptor: Positive, 100% strong staining. Progesterone Receptor: Positive, 100% strong staining. Her2: Positive (ratio 2.55). Ki-67: 12%. Best tumor block for sendout testing: 2B (larger focus), 2E (smaller focus). Pathologic Stage Classification (pTNM, AJCC 8th Edition): Primary Tumor (pT): mpT3 Regional Lymph Nodes (pN): pN2a Distant Metastases (pM): pMX 2. FLUORESCENCE IN-SITU HYBRIDIZATION Results: HER2 - NEGATIVE RATIO OF HER2/CEP17 SIGNALS 1.71 AVERAGE HER2 COPY NUMBER PER CELL 1.80 2. PROGNOSTIC INDICATORS Results: IMMUNOHISTOCHEMICAL AND MORPHOMETRIC ANALYSIS PERFORMED MANUALLY Estrogen Receptor: 95%, POSITIVE, STRONG STAINING INTENSITY Progesterone Receptor: 95%, POSITIVE, STRONG STAINING INTENSITY Proliferation Marker Ki67: 10%  Diagnosis 06/11/17 Breast, right, needle core biopsy, 11:00 o'clock - INVASIVE DUCTAL CARCINOMA, SEE COMMENT. Microscopic Comment The carcinoma appears grade 2. Prognostic markers will be ordered. Dr. Lyndon Code has reviewed the case. The case was called to [facility] on 06/12/2017. Results: HER2 - **POSITIVE** RATIO OF HER2/CEP17 SIGNALS 2.55 AVERAGE HER2 COPY NUMBER PER CELL 4.85  PROCEDURES  ECHO 08/12/17 Study Conclusions  - Left ventricle: The cavity size was normal. Wall thickness was   normal. Systolic function was normal. The estimated ejection   fraction was in the range of 60% to 65%. Wall motion was normal;   there were no regional wall motion abnormalities. GLS -20.2%.   Left ventricular diastolic function parameters were normal. - Aortic valve: There was no stenosis. - Mitral valve:  There was no regurgitation. - Right ventricle: The cavity size was normal. Systolic function   was normal. - Pulmonary arteries: No complete TR doppler jet so unable to   estimate PA systolic pressure. - Inferior vena cava: The vessel was normal in size. The   respirophasic diameter changes were in the normal range (>= 50%),   consistent with normal central venous pressure.  Impressions:  - Normal LV size with EF 60-65%. Normal diastolic function. Strain   as noted above. Normal RV size and systolic function. No   significant valvular abnormalities.  ECHO 11/17/17 Study Conclusions  - Left ventricle: The cavity size was normal. Systolic function was   normal. The estimated ejection fraction was in the range of 60%   to 65%. Wall motion was normal; there were no regional wall   motion abnormalities. - Global longitudinal strain -13.5%. Unchanged from earlier study   11/17/17 and increased from 08/12/17. Myocardial tracking appears   to be accurate.  RADIOGRAPHIC STUDIES: I have personally reviewed the radiological images as listed and agreed with the findings in the report. No results found.   ASSESSMENT & PLAN: Teckla Christiansen is a 42 y.o. premenopausal female with a history of Vitamin D deficiency and high cholesterol and kidney stones, presented with a palpable right breast mass.   1. Malignant neoplasm of upper-outer quadrant of right breast in female, mixed invasive ductal and lobular carcinoma, pT3(m)N2aM0, stage 1b, ductal carcinoma triple positive, lobular carcinoma ER+/PR+/HER2-,  Grade 2 2. Genetics - negative for all mutations tested 3. Hyperglycemia 4. Social work issues 5. Febrile neutropenia 6. Nausea/diarrhea 7. Hypokalemia 8. Insomnia 9. Nosebleeds 10. Tachycardia 11.  Depression, anxiety  T[place] appears stable today, she tolerated cycle 5 TC on 11/21/2017 well overall.  She has persistent nausea despite multiple agents, currently Compazine only gives her  mild relief. Aditionally she has trouble sleeping despite being on Remeron which has improved her appetite and depressed mood.  I will prescribe Ativan 0.5 mg q8h as needed for nausea and insomnia.  She will start with 0.5 tablet to see how she  tolerates it. I added Emend to cycle 6 on 12/12/2017 and have asked our drug approval rep to get preauthorization. She has some constipation, which may be related to beta blocker. I encouraged her to continue drinking enough water and add miralax PRN to maintain BM q1-2 days. She will get IV fluid support today. Return in 2 weeks for f/u with Dr. Burr Medico and cycle 6 TC only. Herceptin and Perjeta on hold until repeat echo and f/u with Dr. Aundra Dubin on 2/1.   PLAN -IVF today -Return in 2 weeks for f/u with Dr. Burr Medico and cycle 6 TC -Prescription for Ativan for insomnia and nausea despite trying multiple agents -Add Emend with cycle 6 pre-med -Miralax PRN to maintain BM q1-2 days  All questions were answered. The patient knows to call the clinic with any problems, questions or concerns. No barriers to learning was detected.     [name], NP 11/28/17

## 2017-11-29 LAB — CANCER ANTIGEN 27.29: CA 27.29: 15.3 U/mL (ref 0.0–38.6)

## 2017-12-01 ENCOUNTER — Encounter: Payer: Self-pay | Admitting: Pharmacy Technician

## 2017-12-01 NOTE — Progress Notes (Signed)
The patient is approved for drug assistance by [employer] for Neulasta. The Enrollment period is from 12/01/17 - 11/17/18. Enrollment is based on patient being under insured. Drug replacement begins on DOS 09/19/17.

## 2017-12-11 NOTE — Progress Notes (Signed)
[facility]  [phone] [phone]  Clinic Follow Up Note   Patient Care Team: [name], MD as PCP - General (Family Medicine) [name] (Family Medicine) [name], MD as Consulting Physician (General Surgery) [name], MD as Consulting Physician (Hematology) Kyung Rudd, MD as Consulting Physician (Radiation Oncology)   Date of Service:  12/12/2017  CHIEF COMPLAINTS:  Follow up right breast cancer    Oncology History    Cancer Staging Malignant neoplasm of upper-outer quadrant of right breast in female, estrogen receptor positive ([name]) Staging form: Breast, AJCC 8th Edition - Clinical stage from 06/11/2017: Stage IB (cT3, cN0, cM0, G2, ER: Positive, PR: Positive, HER2: Positive) - Signed by [name], MD on 06/16/2017 - Pathologic stage from 07/22/2017: Stage IB (pT3(m), pN2a(sn), cM0, G2, ER: Positive, PR: Positive, HER2: Positive) - Signed by [name], NP on 08/04/2017      Malignant neoplasm of upper-outer quadrant of right breast in female, estrogen receptor positive ([place])   06/11/2017 Initial Biopsy    Diagnosis 06/11/17 Breast, right, needle core biopsy, 11:00 o'clock - INVASIVE DUCTAL CARCINOMA, G2      06/11/2017 Mammogram    [name] and MM Diagnostic Breast Tomo Bilateral 06/11/17 IMPRESSION: 1. Highly suspicious mass within the retroareolar right breast, extending from the 8:00 to 11:00 axes, anterior to posterior depth, extending anteriorly to the nipple with associated nipple retraction, measuring at least 5.5 cm by ultrasound  2. No sonographic evidence of metastatic lymphadenopathy in the right axilla. 3. No evidence of malignancy within the left breast.       06/11/2017 Initial Diagnosis    Malignant neoplasm of upper-outer quadrant of right breast in female, estrogen receptor positive (Sheila [name])      06/11/2017 Receptors her2    Estrogen Receptor: 100%, POSITIVE, STRONG STAINING  INTENSITY Progesterone Receptor: 100%, POSITIVE, STRONG STAINING INTENSITY Proliferation Marker Ki67: 12%Proliferation Marker Ki67: 12%  HER2 - **POSITIVE** RATIO OF HER2/CEP17 SIGNALS 2.55 AVERAGE HER2 COPY NUMBER PER CELL 4.85      06/25/2017 Genetic Testing    Negative genetic testing on the 9 gene STAT panel.  The STAT Breast cancer panel offered by [facility] includes sequencing and rearrangement analysis for the following 9 genes:  ATM, BRCA1, BRCA2, CDH1, CHEK2, PALB2, PTEN, STK11 and TP53.   The report date is June 25, 2017.  Negative genetic testing on the common hereditary cancer panel.  The Hereditary Gene Panel offered by [facility] includes sequencing and/or deletion duplication testing of the following 46 genes: APC, ATM, AXIN2, BARD1, BMPR1A, BRCA1, BRCA2, BRIP1, CDH1, CDKN2A (p14ARF), CDKN2A (p16INK4a), CHEK2, CTNNA1, DICER1, EPCAM (Deletion/duplication testing only), GREM1 (promoter region deletion/duplication testing only), KIT, MEN1, MLH1, MSH2, MSH3, MSH6, MUTYH, NBN, NF1, NHTL1, PALB2, PDGFRA, PMS2, POLD1, POLE, PTEN, RAD50, RAD51C, RAD51D, SDHB, SDHC, SDHD, SMAD4, SMARCA4. STK11, TP53, TSC1, TSC2, and VHL.  The following genes were evaluated for sequence changes only: SDHA and HOXB13 c.251G>A variant only.  The report date is June 25, 2017.       07/02/2017 Imaging    CT cAP 07/02/17 IMPRESSION: 1. Subareolar right breast mass. No compelling findings of nodal or metastatic involvement. 2. Old granulomatous disease. 3. There is a 7 mm enhancing or hyperdense focus posteriorly in segment 7 of the liver in the subcapsular region. By virtue of its small size this lesion is technically nonspecific although statistically likely to be a small benign lesions such as flash filling hemangioma. This may warrant surveillance. 4.  Prominent stool throughout  the colon favors constipation. 5. Degenerative disc disease and spondylosis at L5-S1 likely causing mild impingement.       07/02/2017 Imaging    Bone scan 07/02/17 IMPRESSION: Today' s exam is negative. I ascribed the tiny focus of activity just proximal to the left antecubital region to injection site.      07/22/2017 Surgery    RIGHT TOTAL MASTECTOMY WITH RIGHT AXILLARY SENTINEL LYMPH NODE BIOPSY, LEFT PROPHYLACTIC MASTECTOM and INSERTION PORT-A-CATH WITH [name] by [name] and [facility]       07/22/2017 Pathology Results    Diagnosis 1. Breast, simple mastectomy, Left - FIBROADENOMA. - NO MALIGNANCY IDENTIFIED. 2. 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 - ADDITIONAL FOCUS OF LOBULAR CARCINOMA, GRADE 2, SPANNING 2.2 CM. - INTERMEDIATE GRADE DUCTAL CARCINOMA IN SITU. - INVASIVE CARCINOMA COMES TO WITHIN 0.2 TO 0.3 CM OF THE DEEP MARGIN, FOCALLY. - TUMOR FOCALLY INVOLVES EPIDERMIS. - LYMPHOVASCULAR INVASION OF DERMAL LYMPHATICS. - ONE OF ONE LYMPH NODES NEGATIVE FOR CARCINOMA (0/1). - SEE ONCOLOGY TABLE. 3. Lymph node, sentinel, biopsy, Right axillary - ONE LYMPH NODE WITH ISOLATED TUMOR CELLS (0/1). 4. Lymph node, sentinel, biopsy, Right - METASTATIC CARCINOMA IN ONE OF ONE LYMPH NODES (1/1). 5. Lymph node, sentinel, biopsy, Right - ONE OF ONE LYMPH NODES NEGATIVE FOR CARCINOMA (0/1). 6. Lymph node, sentinel, biopsy, Right - METASTATIC CARCINOMA IN ONE OF ONE LYMPH NODES (1/1). - EXTRACAPSULAR EXTENSION. 7. Lymph node, sentinel, biopsy, Right - METASTATIC CARCINOMA IN ONE OF ONE LYMPH NODES (1/1). - EXTRACAPSULAR EXTENSION. 8. Lymph node, sentinel, biopsy, Right - ONE OF ONE LYMPH NODES NEGATIVE FOR CARCINOMA (0/1). 9. Lymph node, sentinel, biopsy, Right - ONE OF ONE LYMPH NODES NEGATIVE FOR CARCINOMA (0/1). 10. Lymph node, sentinel, biopsy, Right - METASTATIC CARCINOMA IN ONE OF ONE LYMPH NODES (1/1). - EXTRACAPSULAR EXTENSION. 11. Lymph node, biopsy, Right axillary - ONE OF ONE LYMPH NODES NEGATIVE FOR CARCINOMA (0/1).        07/22/2017 Receptors her2    Ductal carcinoma in primary breast tumor and node metastasis are ER 95%, PR 95% strongly positive, and HER2 + The lobular component in primary breast tumor is ER 95% positive, PR 95% positive, strong staining, HER-2 negative.       08/25/2017 Imaging    MRI abdomen done at [facility]:  Enhancing structure measing 5 mm is compatible with a flash filling hemangioma. No specific findings to suggest metastatic cancer      08/25/2017 Imaging    MRI Abdomen at [facility] 08/25/17  IMPRESSION: 1. Lesion in segment 7 of the liver has signal and enhancement characteristics compatible with a flash filling hemangioma. No specific findings identified to suggest metastatic disease.      08/29/2017 -  Chemotherapy    adjuvant TCHP every 3 weeks for 6 cycles starting 08/29/17, followed by maintenance Herceptin with or without Perjeta for 6-12 months. Postponed and decreased Carbo to 610m and added Onpro with cycle 2 on 09/19/17 due to neutropenic fever. Carbplatin further reduced to 509mstarting with cycle 3 due to thrombocytopenia.          09/04/2017 - 09/09/2017 Hospital Admission    Admit date: 09/04/2017 Discharge date: 09/09/2017  DISCHARGE DIAGNOSES:  Principal Problem:   [facility]is ([facility]:   Malignant neoplasm of upper-outer quadrant of right breast in female, estrogen receptor positive ([facility]  Port-A-Cath in place   Neutropenic fever ([facility]  Hyponatremia   Malignant neoplasm of  breast in female, estrogen receptor positive ([place])       HISTORY OF PRESENTING ILLNESS: 4[birth year] Suan Halter 42 y.o. female is here because of newly diagnosed Malignant neoplasm of upper-outer quadrant of right breast. She presents to the breast clinic today with her common law husband. She felt the lump initially when she had her physical 2 weeks ago, she had felt her nipples retracted several years ago. A few months ago it has become more pronounced. She had no pain or  discharge.   In the past was diagnosed with Vitamin D deficiency and high cholesterol. She previously was on mood stabilizing medication. She feel right now she does not need that medication but is overwhelmed. She donated eggs and previously had kidney stones removed. Her mother had breast cancer and was diagnosed at 37 along with paternal grandmother in her 57's.   Today she has heart burn which she is taking Prilosec and will see her GI later today. She has trouble sleeping and uses tylenol pm. She tried [place] which did not help, same with melatonin. Her common law husband Is on disability.  GYN HISTORY  Menarchal: 19 LMP: July 23rd, 2018 Contraceptive: 20-29 on birth control pill HRT: NA G0P0: does not want kids  CURRENT THERAPY:  adjuvant chemotherapy TCHP every 3 weeks for 6 cycles beginning 08/29/2017. Decrease Carbo to 629m and added Onpro with cycle 2 on 09/19/17 due to neutropenic fever. Carboplatin further reduced to AUC 4.5 starting with cycle 3 due to thrombocytopenia.    INTERVAL HISTORY:  AMarialena Wollenis here for a follow up and cycle 6 TCHP. She presents to the clinic today accompanied by her husband. She is excited because today is her last cycle of chemotherapy. She notes that she did well last cycle with just the chemo. She will have a ECHO on 2/1. She reports that she has 1 week of ativan left and she takes 1 daily. She notes that she didn't take ativan prior to the start of chemotherapy. She completed her physical therapy since her last visit.   On review of systems, pt reports loose stools x 1-2 episodes daily (she has been self-treating with lomotil), hot flashes, and chills. She has had a menstrual cycle once since the start of chemotherapy treatments and she notes that she has always had irregular menstrual cycles. She denies any other symptoms.      MEDICAL HISTORY:  Past Medical History:  Diagnosis Date  . Anxiety   . Cancer ([facility]    right breast   .  Family history of breast cancer   . Family history of colon cancer   . GERD (gastroesophageal reflux disease)   . Headache    couple of migraines in the past  . History of kidney stones   . Vitamin D deficiency    SURGICAL HISTORY: Past Surgical History:  Procedure Laterality Date  . egg donation    . kidney stone removal    . MASTECTOMY W/ SENTINEL NODE BIOPSY Bilateral 07/22/2017   Procedure: RIGHT TOTAL MASTECTOMY WITH RIGHT AXILLARY SENTINEL LYMPH NODE BIOPSY, LEFT PROPHYLACTIC MASTECTOMY;  Surgeon: WRolm Bookbinder MD;  [facility]  Service: General;  Laterality: Bilateral;  . PORTACATH PLACEMENT Right 07/22/2017   Procedure: INSERTION PORT-A-CATH WITH [place]  Surgeon: WRolm Bookbinder MD;  [facility]  Service: General;  Laterality: Right;   SOCIAL HISTORY: Social History   Socioeconomic History  . Marital status: Single    Spouse name: Not on file  .  Number of children: Not on file  . Years of education: Not on file  . Highest education level: Not on file  Social Needs  . Financial resource strain: Not on file  . Food insecurity - worry: Not on file  . Food insecurity - inability: Not on file  . Transportation needs - medical: Not on file  . Transportation needs - non-medical: Not on file  Occupational History  . Not on file  Tobacco Use  . Smoking status: Never Smoker  . Smokeless tobacco: Former Network engineer and Sexual Activity  . Alcohol use: Yes    Comment: once a year  . Drug use: No  . Sexual activity: Yes  Other Topics Concern  . Not on file  Social History Narrative  . Not on file   FAMILY HISTORY: Family History  Problem Relation Age of Onset  . Breast cancer Paternal Grandmother 36  . Colon cancer Paternal Grandmother 88  . Breast cancer Mother 72  . Prostate cancer Paternal Grandfather        dx in his 31s  . Leukemia Paternal Grandfather   . Lung cancer Maternal Uncle        heavy smoker  . Other Paternal Uncle        farm  accident  . Stroke Maternal Grandfather    ALLERGIES:  has No Known Allergies.  MEDICATIONS:  Current Outpatient Medications  Medication Sig Dispense Refill  . calcium carbonate (TUMS - DOSED IN MG ELEMENTAL CALCIUM) 500 MG chewable tablet Chew 2 tablets by mouth as needed for indigestion or heartburn.     . carvedilol (COREG) 3.125 MG tablet Take 1 tablet (3.125 mg total) by mouth 2 (two) times daily. 60 tablet 3  . Cholecalciferol (VITAMIN D) 2000 units tablet Take 2,000 Units by mouth daily.    Marland Kitchen dexamethasone (DECADRON) 4 MG tablet Take 2 tablets (8 mg total) by mouth 2 (two) times daily. Start the day before Taxotere. Then again the day after chemo for 3 days. 30 tablet 1  . diphenoxylate-atropine (LOMOTIL) 2.5-0.025 MG tablet Take 1-2 tablets by mouth 4 (four) times daily as needed for diarrhea or loose stools. 30 tablet 2  . fluticasone (FLONASE) 50 MCG/ACT nasal spray Place 1 spray into both nostrils daily.    Marland Kitchen lidocaine-prilocaine (EMLA) cream Apply to affected area once 30 g 3  . loperamide (IMODIUM) 2 MG capsule Take 1 capsule (2 mg total) by mouth 3 (three) times daily as needed for diarrhea or loose stools. 30 capsule 0  . LORazepam (ATIVAN) 0.5 MG tablet Take 1 tablet (0.5 mg total) by mouth every 8 (eight) hours as needed for anxiety or sleep (or nausea). 30 tablet 0  . naproxen sodium (ANAPROX) 220 MG tablet Take 220 mg 2 (two) times daily as needed by mouth (pain).     Marland Kitchen omeprazole (PRILOSEC) 20 MG capsule Take 20 mg by mouth as needed.     . potassium chloride (K-DUR) 10 MEQ tablet Take 2 tablets (20 mEq total) daily by mouth. Take 2 tabs twice daily for 5 days then take 2 tablets daily (Patient taking differently: Take 20 mEq by mouth every other day. ) 60 tablet 1  . prochlorperazine (COMPAZINE) 10 MG tablet Take 1 tablet (10 mg total) by mouth every 6 (six) hours as needed (Nausea or vomiting). 30 tablet 1  . promethazine (PHENERGAN) 12.5 MG tablet Take 1 tablet (12.5 mg  total) every 6 (six) hours as needed by mouth for  nausea or vomiting. 30 tablet 2   Current Facility-Administered Medications  Medication Dose Route Frequency Provider Last Rate Last Dose  . gi cocktail (Maalox,Lidocaine,Donnatal)  30 mL Oral Once Harle Stanford., PA-C       Facility-Administered Medications Ordered in Other Visits  Medication Dose Route Frequency Provider Last Rate Last Dose  . CARBOplatin (PARAPLATIN) 560 mg in sodium chloride 0.9 % 250 mL chemo infusion  560 mg Intravenous Once [name], MD      . heparin lock flush 100 unit/mL  500 Units Intracatheter Once PRN [name], MD      . pegfilgrastim (NEULASTA ONPRO KIT) injection 6 mg  6 mg Subcutaneous Once [name], MD      . sodium chloride flush (NS) 0.9 % injection 10 mL  10 mL Intracatheter PRN [name], MD       REVIEW OF SYSTEMS:  Constitutional: Denies fevers, chills or abnormal night sweats  (+) weight gain, 9 pounds since last cycle  Eyes: Denies blurriness of vision, double vision or watery eyes Ears, nose, mouth, throat, and face: Denies mucositis or sore throat (+) occasional epistaxis Respiratory: Denies cough, dyspnea or wheezes Cardiovascular: Denies palpitation, chest discomfort or lower extremity swelling Gastrointestinal:  Denies nausea, heartburn or change in bowel habits   Skin: Denies abnormal skin rashes Lymphatics: Denies new lymphadenopathy or easy bruising Neurological:Denies numbness, tingling or new weaknesses Behavioral/Psych: Mood is stable, no new changes  Breast: (+) bilateral mastectomy on 07/22/17 All other systems were reviewed with the patient and are negative.  PHYSICAL EXAMINATION:  ECOG PERFORMANCE STATUS: 1 - Symptomatic but completely ambulatory  Vitals:   12/12/17 1353  BP: 102/61  Pulse: (!) 111  Resp: 20  Temp: 98.6 F (37 C)  SpO2: 100%   Filed Weights   12/12/17 1353  Weight: 174 lb (78.9 kg)      GENERAL:alert, no distress and comfortable SKIN: skin color,  texture, turgor are normal, no rashes or significant lesions EYES: normal, conjunctiva are pink and non-injected, sclera clear OROPHARYNX:no exudate, no erythema and lips, buccal mucosa, and tongue normal  NECK: supple, thyroid normal size, non-tender, without nodularity LYMPH:  no palpable lymphadenopathy in the cervical, axillary or inguinal LUNGS: clear to auscultation and percussion with normal breathing effort HEART: regular rate & rhythm and no murmurs and no lower extremity edema ABDOMEN:abdomen soft, non-tender and normal bowel sounds Musculoskeletal:no cyanosis of digits and no clubbing  PSYCH: alert & oriented x 3 with fluent speech NEURO: no focal motor/sensory deficits Breast: (+)bilateral mastectomy, incisions clean and intact, healing well. Bilateral drains present.   LABORATORY DATA:  I have reviewed the data as listed CBC Latest Ref Rng & Units 12/12/2017 11/28/2017 11/21/2017  WBC 3.9 - 10.3 K/uL 6.2 5.2 6.4  Hemoglobin 11.6 - 15.9 g/dL 8.9(L) 9.0(L) 9.1(L)  Hematocrit 34.8 - 46.6 % 27.8(L) 27.5(L) 27.5(L)  Platelets 145 - 400 K/uL 137(L) 135(L) 162   CMP Latest Ref Rng & Units 12/12/2017 11/28/2017 11/21/2017  Glucose 70 - 140 mg/dL 160(H) 98 225(H)  BUN 7 - 26 mg/dL 14 14 10.5  Creatinine 0.60 - 1.10 mg/dL 0.88 0.86 0.9  Sodium 136 - 145 mmol/L 139 139 139  Potassium 3.3 - 4.7 mmol/L 3.8 3.6 3.7  Chloride 98 - 109 mmol/L 107 106 -  CO2 22 - 29 mmol/L _0 Calcium 8.4 - 10.4 mg/dL 9.2 8.8 9.1  Total Protein 6.4 - 8.3 g/dL 6.6 6.2(L) 6.5  Total Bilirubin 0.2 -  1.2 mg/dL 0.3 0.4 0.40  Alkaline Phos 40 - 150 U/L 62 77 62  AST 5 - 34 U/L _0 ALT 0 - 55 U/L _1 PATHOLOGY  Diagnosis 07/22/17 1. Breast, simple mastectomy, Left - FIBROADENOMA. - NO MALIGNANCY IDENTIFIED. 2. 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 - ADDITIONAL FOCUS OF LOBULAR CARCINOMA, GRADE 2, SPANNING 2.2 CM. -  INTERMEDIATE GRADE DUCTAL CARCINOMA IN SITU. - INVASIVE CARCINOMA COMES TO WITHIN 0.2 TO 0.3 CM OF THE DEEP MARGIN, FOCALLY. - TUMOR FOCALLY INVOLVES EPIDERMIS. - LYMPHOVASCULAR INVASION OF DERMAL LYMPHATICS. - ONE OF ONE LYMPH NODES NEGATIVE FOR CARCINOMA (0/1). - SEE ONCOLOGY TABLE. 3. Lymph node, sentinel, biopsy, Right axillary - ONE LYMPH NODE WITH ISOLATED TUMOR CELLS (0/1). 4. Lymph node, sentinel, biopsy, Right - METASTATIC CARCINOMA IN ONE OF ONE LYMPH NODES (1/1). 5. Lymph node, sentinel, biopsy, Right - ONE OF ONE LYMPH NODES NEGATIVE FOR CARCINOMA (0/1). 6. Lymph node, sentinel, biopsy, Right - METASTATIC CARCINOMA IN ONE OF ONE LYMPH NODES (1/1). - EXTRACAPSULAR EXTENSION. 7. Lymph node, sentinel, biopsy, Right - METASTATIC CARCINOMA IN ONE OF ONE LYMPH NODES (1/1). - EXTRACAPSULAR EXTENSION. 8. Lymph node, sentinel, biopsy, Right - ONE OF ONE LYMPH NODES NEGATIVE FOR CARCINOMA (0/1). 9. Lymph node, sentinel, biopsy, Right - ONE OF ONE LYMPH NODES NEGATIVE FOR CARCINOMA (0/1). 10. Lymph node, sentinel, biopsy, Right - METASTATIC CARCINOMA IN ONE OF ONE LYMPH NODES (1/1). - EXTRACAPSULAR EXTENSION. 11. Lymph node, biopsy, Right axillary - ONE OF ONE LYMPH NODES NEGATIVE FOR CARCINOMA (0/1). Microscopic Comment 2. BREAST, INVASIVE TUMOR Microscopic Comment(continued) Procedure: Bilateral simple mastectomies with right axillary sentinel lymph node biopsies. Laterality: Right. Tumor Size: 5.3 cm. Histologic Type: Mixed lobular and ductal carcinoma. Grade: 2 Tubular Differentiation: 2 Nuclear Pleomorphism: 3 Mitotic Count: 1 Ductal Carcinoma in Situ (DCIS): Present, intermediate grade. Extent of Tumor: Involves epidermis of nipple focally. Margins: Invasive carcinoma, distance from closest margin: 0.2-0.3 cm of posterior margin (smaller mass). DCIS, distance from closest margin: >0.5 cm all margins Regional Lymph Nodes: Number of Lymph Nodes Examined: 10 Number of  Sentinel Lymph Nodes Examined: 9 Lymph Nodes with Macrometastases: 4 Lymph Nodes with Micrometastases: 0 Lymph Nodes with Isolated Tumor Cells: 1 Breast Prognostic Profile: Performed on biopsy ([id number]), see below. Will be performed on additional lobular focus. Estrogen Receptor: Positive, 100% strong staining. Progesterone Receptor: Positive, 100% strong staining. Her2: Positive (ratio 2.55). Ki-67: 12%. Best tumor block for sendout testing: 2B (larger focus), 2E (smaller focus). Pathologic Stage Classification (pTNM, AJCC 8th Edition): Primary Tumor (pT): mpT3 Regional Lymph Nodes (pN): pN2a Distant Metastases (pM): pMX 2. FLUORESCENCE IN-SITU HYBRIDIZATION Results: HER2 - NEGATIVE RATIO OF HER2/CEP17 SIGNALS 1.71 AVERAGE HER2 COPY NUMBER PER CELL 1.80 2. PROGNOSTIC INDICATORS Results: IMMUNOHISTOCHEMICAL AND MORPHOMETRIC ANALYSIS PERFORMED MANUALLY Estrogen Receptor: 95%, POSITIVE, STRONG STAINING INTENSITY Progesterone Receptor: 95%, POSITIVE, STRONG STAINING INTENSITY Proliferation Marker Ki67: 10%  Diagnosis 06/11/17 Breast, right, needle core biopsy, 11:00 o'clock - INVASIVE DUCTAL CARCINOMA, SEE COMMENT. Microscopic Comment The carcinoma appears grade 2. Prognostic markers will be ordered. Dr. Lyndon Code has reviewed the case. The case was called to [facility] on 06/12/2017. Results: HER2 - **POSITIVE** RATIO OF HER2/CEP17 SIGNALS 2.55 AVERAGE HER2 COPY NUMBER PER CELL 4.85  PROCEDURES  ECHO expected 08/12/17  RADIOGRAPHIC STUDIES: I have personally reviewed the radiological images as listed and agreed with the findings in the report. No results found.  MRI Abdomen at [facility] 08/25/17  IMPRESSION: 1. Lesion in segment 7 of the liver has signal and enhancement characteristics compatible with a flash filling hemangioma. No specific findings identified to suggest metastatic disease.    ASSESSMENT & PLAN:  Sheila [name] is a 42 y.o.  premenopausal female with a history of Vitamin D deficiency and high cholesterol and kidney stones, presented with a palpable right breast mass.   1. Malignant neoplasm of upper-outer quadrant of right breast in female, mixed invasive ductal and lobular carcinoma, pT3(m)N2aM0, stage 1b, ductal carcinoma triple positive, lobular carcinoma ER+/PR+/HER2-,  Grade 2 -She underwent staging CT and bone scan on 07/02/2017 prior to surgery, both were negative for metastasis. -There was a 59m enhancing or hyperdense focus posteriorly in the liver which is technically nonspecific, and her abdominal MRI showed this lesion is likely hemangioma. -I reviewed the surgical pathology with the patient and her husband in detail.  -Final diagnosis from the right simple mastectomy and sentinel lymph node biopsy on 07/22/2017 revealed mixed invasive lobular and ductal carcinoma, grade 2, spanning 5.3 cm. There is an additional focus of lobular carcinoma, grade 2, spanning 2.2cm and it is ER+/PR+/HER2-. 4 of 10 lymph nodes tested positive for macrometastases and they are HER2+ ductal carcinoma.  -I also discussed the possibility of oral anti-HER2 therapy after she completes Herceptin maintenance therapy. -We reviewed the risk of cancer recurrence after complete surgical resection. Giving the large size of tumor, multiple positive lymph nodes, HER-2 positive disease, she has very high risk for recurrence. I strongly recommend adjuvant chemotherapy to reduce her risk of recurrence.  -She initially preferred reconstruction. She saw Dr. TIran Planasbut ultimately did not opt for reconstruction.  -Given her locally advanced disease, I recommend adjuvant docetaxel, carboplatin, Herceptin and perjeta (TCHP) every 3 weeks for 6 cycles, followed by maintenance Herceptin with Perjeta for 12 months.  I discussed the additional benefit of pejeta to Herceptin in adjuvant setting, given her locally advanced disease, if her insurance approves, I  will continue Herceptin and pejeta as maintenance therapy for a total of 1 year. -Given her ER/PR positive tumor, I recommend adjuvant antiestrogen therapy with tamoxifen, starting after her adjuvant irradiation. Benefit and the side effects reviewed with her, she is interested. Given her young age, ovarian suppression and AI would be likely improve her cancer survival, we have discussed and she will think about it.  -PAC placed during surgery, Chest xray confirms tip placement in the SVC. -Baseline echo was normal, she saw Dr. MAundra Dubinon 08/12/2017 -[facility] developed neutropenic fever and diarrhea afterwards and was hospitalized. -Due to her neutropenic fever after first cycle we will decrease carboplatin dose starting with cycle 2 on 09/19/17 and will add Neulasta.  -Carboplatin further reduced to AUC 4.5 due to thrombocytopenia starting with cycle 3.  -She will continue with ECHO every 3-4 months until she is done with Herceptin.  -Her last echo on 11/17/17 showed normal EF 60-65%, but worsening global longitudinal strain.  Dr. MAlgernon Huxleyhas recommended to hold Herceptin for now.  She is scheduled to repeat echo on December 19, 2017. -Hgb at 8.9 and platelets at 137k today, will proceed with treatment today -Will refill compazine and ativan toda -she is scheduled to receive IV fluids on 2/1 Lab, flush, f/u and herceptin in 3 weeks Rad/onc referral   2. Genetics -Due to her family history of breast cancer, she agreed to a genetics referral -She was negative for all mutations tested  3. Hyperglycemia -she has no personal history of diabetes -we discussed BG can increase during chemotherapy especially in the setting of steroid premeds -she can monitor at home with her husband's glucometer, she will notify us if consistently elevated or over 140 postprandial -BG 151 on 08/28/17 -She is on dexamethasone so I suggest her to closely monitor her BG at home and if high we can  reduce her dose. Decrease dexamethasone to 1 tab in the AM and 2 tabs in the PM for 3 days after each treatment due to her hyperglycemia. She can take 1 additional pill as needed for her nausea. -Her sugar remains elevated at 175 today (10/31/17). Given her mild nausea and no sign of allergy reactions so far, we will dexa to 82m bid for day 2 and 4 mg daily for day 3-4 -blood sugar today at 160 (12/12/17)   4. 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  -Provided another letter today (10/31/17)  5. Febrile Neutropenia  -on 09/04/17 pt presented with neutropenic fever, tachycardia, tachypnea, hypotension, and lethargy. Tmax 100.8 in clinic.  -She was transported to ED for code sepsis and was treated with antibiotics  -Neulasta was denied by her insurance, she received Granix after first cycle chemo, we will give Neulasta after cycle 2 chemotherapy. -Added Onpro starting with cycle 2 and neutropenia     6. Nausea/Diarrhea -I previously explained to use imodium at the first sign of diarrhea, up to 8. If diarrhea persist I recommend lomotil which I prescribed on 09/19/17.  -I previously suggested she uses Phenergan or compazine for antiemetics, I previously encouraged her not to take at the same time.   7. Hypokalemia  -10/02/17 labs showed potassium at 2.9. She will get 20 mEq in 1 L NS today over 2 hours, will start 40 mEq PO x5 days then reduce to 20 mEq daily until next visit.  -Continue oral potassium for now due to diarrhea -She received IV fluids due to suspected dehydration on 10/10/17 -potassium normal on 10/31/17 to 4.0.  -potassium at 3.8 today, 12/12/17   8. Insomnia    -She has history of insomnia failed multiple agents such as lunesta, trazodone, tylenol pm, and xanax. -She was prescribed Remeron for her to try on 10/10/17. I explained this may also help with her anxiety related to treatment and side effects. -Remeron has helped mildly. She would like to increase dose to 354m I called in on 10/31/17 -She reports that Ativan has alleviated her symptoms thus far and she voiced needing a refill today. Refill provided today, 12/12/17   PLAN:  Refill Ativan today -Labs reviewed and adequate to proceed with TC cycle 6 (last cycle) today, continue carbo AUC 4.5, will still hold her Herceptin and pejeta today due to her recent abnormal echo Lab, flush, f/u and herceptin in 3 weeks if her ECHO on 2/1 shows improvement  Rad/onc referral  Will discuss ovarian suppression and aromatase inhibitor in the next few appointments.   All questions were answered. The patient knows to call the clinic with any problems, questions or concerns.  I spent 20 minutes counseling the patient face to face. The total time spent in the  appointment was 25 minutes and more than 50% was on counseling.   [name]  12/12/2017   This document serves as a record of services personally performed by [name], MD. It was created on her behalf by Steva Colder, a trained medical scribe. The creation of this record is based on the scribe's personal observations and the provider's statements to them.   I have reviewed the above documentation for accuracy and completeness, and I agree with the above.

## 2017-12-12 ENCOUNTER — Inpatient Hospital Stay: Payer: PRIVATE HEALTH INSURANCE

## 2017-12-12 ENCOUNTER — Encounter: Payer: Self-pay | Admitting: Hematology

## 2017-12-12 ENCOUNTER — Inpatient Hospital Stay (HOSPITAL_BASED_OUTPATIENT_CLINIC_OR_DEPARTMENT_OTHER): Payer: PRIVATE HEALTH INSURANCE | Admitting: Hematology

## 2017-12-12 ENCOUNTER — Encounter: Payer: Self-pay | Admitting: Radiation Oncology

## 2017-12-12 VITALS — BP 102/61 | HR 111 | Temp 98.6°F | Resp 20 | Ht 66.0 in | Wt 174.0 lb

## 2017-12-12 DIAGNOSIS — G4700 Insomnia, unspecified: Secondary | ICD-10-CM | POA: Diagnosis not present

## 2017-12-12 DIAGNOSIS — Z9011 Acquired absence of right breast and nipple: Secondary | ICD-10-CM | POA: Diagnosis not present

## 2017-12-12 DIAGNOSIS — R232 Flushing: Secondary | ICD-10-CM | POA: Diagnosis not present

## 2017-12-12 DIAGNOSIS — R739 Hyperglycemia, unspecified: Secondary | ICD-10-CM

## 2017-12-12 DIAGNOSIS — R5081 Fever presenting with conditions classified elsewhere: Secondary | ICD-10-CM | POA: Diagnosis not present

## 2017-12-12 DIAGNOSIS — R5383 Other fatigue: Secondary | ICD-10-CM

## 2017-12-12 DIAGNOSIS — E876 Hypokalemia: Secondary | ICD-10-CM

## 2017-12-12 DIAGNOSIS — Z95828 Presence of other vascular implants and grafts: Secondary | ICD-10-CM

## 2017-12-12 DIAGNOSIS — C50411 Malignant neoplasm of upper-outer quadrant of right female breast: Secondary | ICD-10-CM | POA: Diagnosis not present

## 2017-12-12 DIAGNOSIS — Z87442 Personal history of urinary calculi: Secondary | ICD-10-CM

## 2017-12-12 DIAGNOSIS — E559 Vitamin D deficiency, unspecified: Secondary | ICD-10-CM

## 2017-12-12 DIAGNOSIS — Z800 Family history of malignant neoplasm of digestive organs: Secondary | ICD-10-CM

## 2017-12-12 DIAGNOSIS — Z5111 Encounter for antineoplastic chemotherapy: Secondary | ICD-10-CM | POA: Diagnosis not present

## 2017-12-12 DIAGNOSIS — R197 Diarrhea, unspecified: Secondary | ICD-10-CM

## 2017-12-12 DIAGNOSIS — R000 Tachycardia, unspecified: Secondary | ICD-10-CM

## 2017-12-12 DIAGNOSIS — R110 Nausea: Secondary | ICD-10-CM | POA: Diagnosis not present

## 2017-12-12 DIAGNOSIS — E7800 Pure hypercholesterolemia, unspecified: Secondary | ICD-10-CM | POA: Diagnosis not present

## 2017-12-12 DIAGNOSIS — M549 Dorsalgia, unspecified: Secondary | ICD-10-CM

## 2017-12-12 DIAGNOSIS — Z170 Estrogen receptor positive status [ER+]: Principal | ICD-10-CM

## 2017-12-12 DIAGNOSIS — M5137 Other intervertebral disc degeneration, lumbosacral region: Secondary | ICD-10-CM

## 2017-12-12 DIAGNOSIS — F418 Other specified anxiety disorders: Secondary | ICD-10-CM

## 2017-12-12 DIAGNOSIS — R12 Heartburn: Secondary | ICD-10-CM

## 2017-12-12 DIAGNOSIS — K219 Gastro-esophageal reflux disease without esophagitis: Secondary | ICD-10-CM

## 2017-12-12 DIAGNOSIS — Z79899 Other long term (current) drug therapy: Secondary | ICD-10-CM

## 2017-12-12 DIAGNOSIS — Z803 Family history of malignant neoplasm of breast: Secondary | ICD-10-CM

## 2017-12-12 DIAGNOSIS — K5900 Constipation, unspecified: Secondary | ICD-10-CM

## 2017-12-12 LAB — CBC WITH DIFFERENTIAL/PLATELET
BASOS PCT: 0 %
Basophils Absolute: 0 10*3/uL (ref 0.0–0.1)
EOS ABS: 0 10*3/uL (ref 0.0–0.5)
Eosinophils Relative: 0 %
HEMATOCRIT: 27.8 % — AB (ref 34.8–46.6)
HEMOGLOBIN: 8.9 g/dL — AB (ref 11.6–15.9)
Lymphocytes Relative: 15 %
Lymphs Abs: 0.9 10*3/uL (ref 0.9–3.3)
MCH: 30.7 pg (ref 25.1–34.0)
MCHC: 32 g/dL (ref 31.5–36.0)
MCV: 95.8 fL (ref 79.5–101.0)
MONO ABS: 0.5 10*3/uL (ref 0.1–0.9)
Monocytes Relative: 8 %
NEUTROS ABS: 4.8 10*3/uL (ref 1.5–6.5)
NEUTROS PCT: 77 %
Platelets: 137 10*3/uL — ABNORMAL LOW (ref 145–400)
RBC: 2.9 MIL/uL — ABNORMAL LOW (ref 3.70–5.45)
RDW: 19.2 % — AB (ref 11.2–16.1)
WBC: 6.2 10*3/uL (ref 3.9–10.3)

## 2017-12-12 LAB — COMPREHENSIVE METABOLIC PANEL
ALK PHOS: 62 U/L (ref 40–150)
ALT: 26 U/L (ref 0–55)
ANION GAP: 10 (ref 3–11)
AST: 19 U/L (ref 5–34)
Albumin: 3.5 g/dL (ref 3.5–5.0)
BUN: 14 mg/dL (ref 7–26)
CALCIUM: 9.2 mg/dL (ref 8.4–10.4)
CO2: 22 mmol/L (ref 22–29)
Chloride: 107 mmol/L (ref 98–109)
Creatinine, Ser: 0.88 mg/dL (ref 0.60–1.10)
GFR calc non Af Amer: >60 mL/min (ref >60)
Glucose, Bld: 160 mg/dL — ABNORMAL HIGH (ref 70–140)
Potassium: 3.8 mmol/L (ref 3.3–4.7)
Sodium: 139 mmol/L (ref 136–145)
TOTAL PROTEIN: 6.6 g/dL (ref 6.4–8.3)
Total Bilirubin: 0.3 mg/dL (ref 0.2–1.2)

## 2017-12-12 MED ORDER — HEPARIN SOD (PORK) LOCK FLUSH 100 UNIT/ML IV SOLN
500.0000 [IU] | Freq: Once | INTRAVENOUS | Status: AC | PRN
  Administered 2017-12-12: 500 [IU]
  Filled 2017-12-12: qty 5

## 2017-12-12 MED ORDER — SODIUM CHLORIDE 0.9 % IV SOLN
Freq: Once | INTRAVENOUS | Status: AC
  Administered 2017-12-12: 15:00:00 via INTRAVENOUS

## 2017-12-12 MED ORDER — SODIUM CHLORIDE 0.9% FLUSH
10.0000 mL | INTRAVENOUS | Status: DC | PRN
  Administered 2017-12-12: 10 mL
  Filled 2017-12-12: qty 10

## 2017-12-12 MED ORDER — PEGFILGRASTIM 6 MG/0.6ML ~~LOC~~ PSKT
PREFILLED_SYRINGE | SUBCUTANEOUS | Status: AC
  Filled 2017-12-12: qty 0.6

## 2017-12-12 MED ORDER — SODIUM CHLORIDE 0.9 % IV SOLN
557.1000 mg | Freq: Once | INTRAVENOUS | Status: AC
  Administered 2017-12-12: 560 mg via INTRAVENOUS
  Filled 2017-12-12: qty 56

## 2017-12-12 MED ORDER — PALONOSETRON HCL INJECTION 0.25 MG/5ML
INTRAVENOUS | Status: AC
  Filled 2017-12-12: qty 5

## 2017-12-12 MED ORDER — LORAZEPAM 0.5 MG PO TABS: 0.5000 mg | ORAL_TABLET | Freq: Three times a day (TID) | ORAL | 0 refills | Status: DC | PRN

## 2017-12-12 MED ORDER — PEGFILGRASTIM 6 MG/0.6ML ~~LOC~~ PSKT
6.0000 mg | PREFILLED_SYRINGE | Freq: Once | SUBCUTANEOUS | Status: AC
  Administered 2017-12-12: 6 mg via SUBCUTANEOUS

## 2017-12-12 MED ORDER — SODIUM CHLORIDE 0.9% FLUSH
10.0000 mL | Freq: Once | INTRAVENOUS | Status: AC
  Administered 2017-12-12: 10 mL
  Filled 2017-12-12: qty 10

## 2017-12-12 MED ORDER — PROCHLORPERAZINE MALEATE 10 MG PO TABS: 10.0000 mg | ORAL_TABLET | Freq: Four times a day (QID) | ORAL | 1 refills | Status: DC | PRN

## 2017-12-12 MED ORDER — PALONOSETRON HCL INJECTION 0.25 MG/5ML
0.2500 mg | Freq: Once | INTRAVENOUS | Status: AC
  Administered 2017-12-12: 0.25 mg via INTRAVENOUS

## 2017-12-12 MED ORDER — SODIUM CHLORIDE 0.9 % IV SOLN
Freq: Once | INTRAVENOUS | Status: AC
  Administered 2017-12-12: 15:00:00 via INTRAVENOUS
  Filled 2017-12-12: qty 5

## 2017-12-12 MED ORDER — SODIUM CHLORIDE 0.9 % IV SOLN
75.0000 mg/m2 | Freq: Once | INTRAVENOUS | Status: AC
  Administered 2017-12-12: 140 mg via INTRAVENOUS
  Filled 2017-12-12: qty 14

## 2017-12-12 NOTE — Patient Instructions (Signed)
[facility] Discharge Instructions for Patients Receiving Chemotherapy  Today you received the following chemotherapy agents: Taxotere, Carboplatin  To help prevent nausea and vomiting after your treatment, we encourage you to take your nausea medication as prescribed.   If you develop nausea and vomiting that is not controlled by your nausea medication, call the clinic.   BELOW ARE SYMPTOMS THAT SHOULD BE REPORTED IMMEDIATELY:  *FEVER GREATER THAN 100.5 F  *CHILLS WITH OR WITHOUT FEVER  NAUSEA AND VOMITING THAT IS NOT CONTROLLED WITH YOUR NAUSEA MEDICATION  *UNUSUAL SHORTNESS OF BREATH  *UNUSUAL BRUISING OR BLEEDING  TENDERNESS IN MOUTH AND THROAT WITH OR WITHOUT PRESENCE OF ULCERS  *URINARY PROBLEMS  *BOWEL PROBLEMS  UNUSUAL RASH Items with * indicate a potential emergency and should be followed up as soon as possible.  Feel free to call the clinic should you have any questions or concerns. The clinic phone number is [phone].  Please show the CHEMO ALERT CARD at check-in to the Emergency Department and triage nurse.   

## 2017-12-12 NOTE — Patient Instructions (Signed)
Implanted Port Home Guide An implanted port is a type of central line that is placed under the skin. Central lines are used to provide IV access when treatment or nutrition needs to be given through a person's veins. Implanted ports are used for long-term IV access. An implanted port may be placed because:  You need IV medicine that would be irritating to the small veins in your hands or arms.  You need long-term IV medicines, such as antibiotics.  You need IV nutrition for a long period.  You need frequent blood draws for lab tests.  You need dialysis.  Implanted ports are usually placed in the chest area, but they can also be placed in the upper arm, the abdomen, or the leg. An implanted port has two main parts:  Reservoir. The reservoir is round and will appear as a small, raised area under your skin. The reservoir is the part where a needle is inserted to give medicines or draw blood.  Catheter. The catheter is a thin, flexible tube that extends from the reservoir. The catheter is placed into a large vein. Medicine that is inserted into the reservoir goes into the catheter and then into the vein.  How will I care for my incision site? Do not get the incision site wet. Bathe or shower as directed by your health care provider. How is my port accessed? Special steps must be taken to access the port:  Before the port is accessed, a numbing cream can be placed on the skin. This helps numb the skin over the port site.  Your health care provider uses a sterile technique to access the port. ? Your health care provider must put on a mask and sterile gloves. ? The skin over your port is cleaned carefully with an antiseptic and allowed to dry. ? The port is gently pinched between sterile gloves, and a needle is inserted into the port.  Only "non-coring" port needles should be used to access the port. Once the port is accessed, a blood return should be checked. This helps ensure that the port  is in the vein and is not clogged.  If your port needs to remain accessed for a constant infusion, a clear (transparent) bandage will be placed over the needle site. The bandage and needle will need to be changed every week, or as directed by your health care provider.  Keep the bandage covering the needle clean and dry. Do not get it wet. Follow your health care provider's instructions on how to take a shower or bath while the port is accessed.  If your port does not need to stay accessed, no bandage is needed over the port.  What is flushing? Flushing helps keep the port from getting clogged. Follow your health care provider's instructions on how and when to flush the port. Ports are usually flushed with saline solution or a medicine called heparin. The need for flushing will depend on how the port is used.  If the port is used for intermittent medicines or blood draws, the port will need to be flushed: ? After medicines have been given. ? After blood has been drawn. ? As part of routine maintenance.  If a constant infusion is running, the port may not need to be flushed.  How long will my port stay implanted? The port can stay in for as long as your health care provider thinks it is needed. When it is time for the port to come out, surgery will be   done to remove it. The procedure is similar to the one performed when the port was put in. When should I seek immediate medical care? When you have an implanted port, you should seek immediate medical care if:  You notice a bad smell coming from the incision site.  You have swelling, redness, or drainage at the incision site.  You have more swelling or pain at the port site or the surrounding area.  You have a fever that is not controlled with medicine.  This information is not intended to replace advice given to you by your health care provider. Make sure you discuss any questions you have with your health care provider. Document  Released: 11/04/2005 Document Revised: 04/11/2016 Document Reviewed: 07/12/2013 [employer] Interactive Patient Education  2017 [employer]  

## 2017-12-19 ENCOUNTER — Inpatient Hospital Stay: Payer: PRIVATE HEALTH INSURANCE

## 2017-12-19 ENCOUNTER — Encounter: Payer: Self-pay | Admitting: Adult Health

## 2017-12-19 ENCOUNTER — Ambulatory Visit (HOSPITAL_BASED_OUTPATIENT_CLINIC_OR_DEPARTMENT_OTHER)
Admission: RE | Admit: 2017-12-19 | Discharge: 2017-12-19 | Disposition: A | Discharge status: Home / Self Care | Payer: PRIVATE HEALTH INSURANCE | Source: Ambulatory Visit | Attending: Cardiology | Admitting: Cardiology

## 2017-12-19 ENCOUNTER — Encounter (HOSPITAL_COMMUNITY): Payer: Self-pay | Admitting: Cardiology

## 2017-12-19 ENCOUNTER — Other Ambulatory Visit: Payer: Self-pay

## 2017-12-19 ENCOUNTER — Inpatient Hospital Stay: Payer: PRIVATE HEALTH INSURANCE | Attending: Nurse Practitioner | Admitting: Adult Health

## 2017-12-19 ENCOUNTER — Ambulatory Visit (HOSPITAL_COMMUNITY)
Admission: RE | Admit: 2017-12-19 | Discharge: 2017-12-19 | Disposition: A | Discharge status: Home / Self Care | Payer: PRIVATE HEALTH INSURANCE | Source: Ambulatory Visit | Attending: Internal Medicine | Admitting: Internal Medicine

## 2017-12-19 VITALS — BP 100/71 | HR 117 | Wt 171.8 lb

## 2017-12-19 VITALS — BP 96/66 | HR 141 | Temp 98.1°F | Resp 20 | Ht 66.0 in | Wt 170.0 lb

## 2017-12-19 VITALS — BP 101/53 | HR 112 | Resp 18

## 2017-12-19 DIAGNOSIS — R5383 Other fatigue: Secondary | ICD-10-CM

## 2017-12-19 DIAGNOSIS — K219 Gastro-esophageal reflux disease without esophagitis: Secondary | ICD-10-CM | POA: Diagnosis not present

## 2017-12-19 DIAGNOSIS — C779 Secondary and unspecified malignant neoplasm of lymph node, unspecified: Secondary | ICD-10-CM

## 2017-12-19 DIAGNOSIS — M5137 Other intervertebral disc degeneration, lumbosacral region: Secondary | ICD-10-CM

## 2017-12-19 DIAGNOSIS — Z79899 Other long term (current) drug therapy: Secondary | ICD-10-CM

## 2017-12-19 DIAGNOSIS — Z170 Estrogen receptor positive status [ER+]: Secondary | ICD-10-CM

## 2017-12-19 DIAGNOSIS — Z9011 Acquired absence of right breast and nipple: Secondary | ICD-10-CM | POA: Diagnosis not present

## 2017-12-19 DIAGNOSIS — F419 Anxiety disorder, unspecified: Secondary | ICD-10-CM

## 2017-12-19 DIAGNOSIS — R000 Tachycardia, unspecified: Secondary | ICD-10-CM | POA: Diagnosis not present

## 2017-12-19 DIAGNOSIS — C50411 Malignant neoplasm of upper-outer quadrant of right female breast: Secondary | ICD-10-CM

## 2017-12-19 DIAGNOSIS — C50911 Malignant neoplasm of unspecified site of right female breast: Secondary | ICD-10-CM | POA: Insufficient documentation

## 2017-12-19 DIAGNOSIS — Z801 Family history of malignant neoplasm of trachea, bronchus and lung: Secondary | ICD-10-CM | POA: Diagnosis not present

## 2017-12-19 DIAGNOSIS — Z9013 Acquired absence of bilateral breasts and nipples: Secondary | ICD-10-CM | POA: Diagnosis not present

## 2017-12-19 DIAGNOSIS — Z95828 Presence of other vascular implants and grafts: Secondary | ICD-10-CM

## 2017-12-19 DIAGNOSIS — Z800 Family history of malignant neoplasm of digestive organs: Secondary | ICD-10-CM

## 2017-12-19 DIAGNOSIS — Z8042 Family history of malignant neoplasm of prostate: Secondary | ICD-10-CM

## 2017-12-19 DIAGNOSIS — Z803 Family history of malignant neoplasm of breast: Secondary | ICD-10-CM

## 2017-12-19 DIAGNOSIS — Z9221 Personal history of antineoplastic chemotherapy: Secondary | ICD-10-CM | POA: Diagnosis not present

## 2017-12-19 DIAGNOSIS — E860 Dehydration: Secondary | ICD-10-CM

## 2017-12-19 LAB — CBC WITH DIFFERENTIAL/PLATELET
BASOS ABS: 0 10*3/uL (ref 0.0–0.1)
BASOS PCT: 0 %
EOS PCT: 0 %
Eosinophils Absolute: 0 10*3/uL (ref 0.0–0.5)
HCT: 30.1 % — ABNORMAL LOW (ref 34.8–46.6)
Hemoglobin: 9.4 g/dL — ABNORMAL LOW (ref 11.6–15.9)
Lymphocytes Relative: 44 %
Lymphs Abs: 1.3 10*3/uL (ref 0.9–3.3)
MCH: 30.7 pg (ref 25.1–34.0)
MCHC: 31.2 g/dL — ABNORMAL LOW (ref 31.5–36.0)
MCV: 98.4 fL (ref 79.5–101.0)
MONO ABS: 0.6 10*3/uL (ref 0.1–0.9)
Monocytes Relative: 20 %
NEUTROS ABS: 1 10*3/uL — AB (ref 1.5–6.5)
Neutrophils Relative %: 36 %
PLATELETS: 81 10*3/uL — AB (ref 145–400)
RBC: 3.06 MIL/uL — ABNORMAL LOW (ref 3.70–5.45)
RDW: 16.8 % — AB (ref 11.2–14.5)
WBC: 2.9 10*3/uL — ABNORMAL LOW (ref 3.9–10.3)

## 2017-12-19 LAB — COMPREHENSIVE METABOLIC PANEL
ALBUMIN: 3.6 g/dL (ref 3.5–5.0)
ALK PHOS: 71 U/L (ref 40–150)
ALT: 12 U/L (ref 0–55)
ANION GAP: 10 (ref 3–11)
AST: 11 U/L (ref 5–34)
BILIRUBIN TOTAL: 0.4 mg/dL (ref 0.2–1.2)
BUN: 15 mg/dL (ref 7–26)
CALCIUM: 8.8 mg/dL (ref 8.4–10.4)
CO2: 23 mmol/L (ref 22–29)
Chloride: 104 mmol/L (ref 98–109)
Creatinine, Ser: 0.94 mg/dL (ref 0.60–1.10)
GFR calc Af Amer: >60 mL/min (ref >60)
GFR calc non Af Amer: >60 mL/min (ref >60)
GLUCOSE: 136 mg/dL (ref 70–140)
Potassium: 3.9 mmol/L (ref 3.5–5.1)
Sodium: 137 mmol/L (ref 136–145)
TOTAL PROTEIN: 6.4 g/dL (ref 6.4–8.3)

## 2017-12-19 MED ORDER — SODIUM CHLORIDE 0.9% FLUSH
10.0000 mL | Freq: Once | INTRAVENOUS | Status: AC
  Administered 2017-12-19: 10 mL
  Filled 2017-12-19: qty 10

## 2017-12-19 MED ORDER — SODIUM CHLORIDE 0.9% FLUSH
10.0000 mL | INTRAVENOUS | Status: DC | PRN
  Administered 2017-12-19: 10 mL
  Filled 2017-12-19: qty 10

## 2017-12-19 MED ORDER — SODIUM CHLORIDE 0.9 % IV SOLN
Freq: Once | INTRAVENOUS | Status: AC
  Administered 2017-12-19: 14:00:00 via INTRAVENOUS

## 2017-12-19 MED ORDER — HEPARIN SOD (PORK) LOCK FLUSH 100 UNIT/ML IV SOLN
500.0000 [IU] | Freq: Once | INTRAVENOUS | Status: AC | PRN
  Administered 2017-12-19: 500 [IU]
  Filled 2017-12-19: qty 5

## 2017-12-19 MED ORDER — SODIUM CHLORIDE 0.9 % IV SOLN: Freq: Once | INTRAVENOUS | Status: DC

## 2017-12-19 NOTE — Progress Notes (Signed)
  Echocardiogram 2D Echocardiogram has been performed.  Jennette Dubin 12/19/2017, 10:14 AM

## 2017-12-19 NOTE — Progress Notes (Signed)
[facility] Follow up:    [name], [place] [address]   DIAGNOSIS: Cancer Staging Malignant neoplasm of upper-outer quadrant of right breast in female, estrogen receptor positive ([place]) Staging form: Breast, AJCC 8th Edition - Clinical stage from 06/11/2017: Stage IB (cT3, cN0, cM0, G2, ER: Positive, PR: Positive, HER2: Positive) - Signed by [name], MD on 06/16/2017 - Pathologic stage from 07/22/2017: Stage IB (pT3(m), pN2a(sn), cM0, G2, ER: Positive, PR: Positive, HER2: Positive) - Signed by [name], NP on 08/04/2017   SUMMARY OF ONCOLOGIC HISTORY: Oncology History    Cancer Staging Malignant neoplasm of upper-outer quadrant of right breast in female, estrogen receptor positive ([place]) Staging form: Breast, AJCC 8th Edition - Clinical stage from 06/11/2017: Stage IB (cT3, cN0, cM0, G2, ER: Positive, PR: Positive, HER2: Positive) - Signed by [name], MD on 06/16/2017 - Pathologic stage from 07/22/2017: Stage IB (pT3(m), pN2a(sn), cM0, G2, ER: Positive, PR: Positive, HER2: Positive) - Signed by [name], NP on 08/04/2017      Malignant neoplasm of upper-outer quadrant of right breast in female, estrogen receptor positive ([place])   06/11/2017 Initial Biopsy    Diagnosis 06/11/17 Breast, right, needle core biopsy, 11:00 o'clock - INVASIVE DUCTAL CARCINOMA, G2      06/11/2017 Mammogram    [name] and [facility] Diagnostic Breast Tomo Bilateral 06/11/17 IMPRESSION: 1. Highly suspicious mass within the retroareolar right breast, extending from the 8:00 to 11:00 axes, anterior to posterior depth, extending anteriorly to the nipple with associated nipple retraction, measuring at least 5.5 cm by ultrasound  2. No sonographic evidence of metastatic lymphadenopathy in the right axilla. 3. No evidence of malignancy within the left breast.       06/11/2017 Initial Diagnosis    Malignant neoplasm of upper-outer quadrant of right breast in  female, estrogen receptor positive ([place])      06/11/2017 Receptors her2    Estrogen Receptor: 100%, POSITIVE, STRONG STAINING INTENSITY Progesterone Receptor: 100%, POSITIVE, STRONG STAINING INTENSITY Proliferation Marker Ki67: 12%Proliferation Marker Ki67: 12%  HER2 - **POSITIVE** RATIO OF HER2/CEP17 SIGNALS 2.55 AVERAGE HER2 COPY NUMBER PER CELL 4.85      06/25/2017 Genetic Testing    Negative genetic testing on the 9 gene STAT panel.  The STAT Breast cancer panel offered by [facility] includes sequencing and rearrangement analysis for the following 9 genes:  ATM, BRCA1, BRCA2, CDH1, CHEK2, PALB2, PTEN, STK11 and TP53.   The report date is June 25, 2017.  Negative genetic testing on the common hereditary cancer panel.  The Hereditary Gene Panel offered by [facility] includes sequencing and/or deletion duplication testing of the following 46 genes: APC, ATM, AXIN2, BARD1, BMPR1A, BRCA1, BRCA2, BRIP1, CDH1, CDKN2A (p14ARF), CDKN2A (p16INK4a), CHEK2, CTNNA1, DICER1, EPCAM (Deletion/duplication testing only), GREM1 (promoter region deletion/duplication testing only), KIT, MEN1, MLH1, MSH2, MSH3, MSH6, MUTYH, NBN, NF1, NHTL1, PALB2, PDGFRA, PMS2, POLD1, POLE, PTEN, RAD50, RAD51C, RAD51D, SDHB, SDHC, SDHD, SMAD4, SMARCA4. STK11, TP53, TSC1, TSC2, and VHL.  The following genes were evaluated for sequence changes only: SDHA and HOXB13 c.251G>A variant only.  The report date is June 25, 2017.       07/02/2017 Imaging    CT cAP 07/02/17 IMPRESSION: 1. Subareolar right breast mass. No compelling findings of nodal or metastatic involvement. 2. Old granulomatous disease. 3. There is a 7 mm enhancing or hyperdense focus posteriorly in segment 7 of the liver in the subcapsular region. By virtue of its small size this  lesion is technically nonspecific although statistically likely to be a small benign lesions such as flash filling hemangioma. This may warrant surveillance. 4.  Prominent stool throughout the  colon favors constipation. 5. Degenerative disc disease and spondylosis at L5-S1 likely causing mild impingement.      07/02/2017 Imaging    Bone scan 07/02/17 IMPRESSION: Today' s exam is negative. I ascribed the tiny focus of activity just proximal to the left antecubital region to injection site.      07/22/2017 Surgery    RIGHT TOTAL MASTECTOMY WITH RIGHT AXILLARY SENTINEL LYMPH NODE BIOPSY, LEFT PROPHYLACTIC MASTECTOM and INSERTION PORT-A-CATH WITH [name] by [name] and [facility]       07/22/2017 Pathology Results    Diagnosis 1. Breast, simple mastectomy, Left - FIBROADENOMA. - NO MALIGNANCY IDENTIFIED. 2. 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 - ADDITIONAL FOCUS OF LOBULAR CARCINOMA, GRADE 2, SPANNING 2.2 CM. - INTERMEDIATE GRADE DUCTAL CARCINOMA IN SITU. - INVASIVE CARCINOMA COMES TO WITHIN 0.2 TO 0.3 CM OF THE DEEP MARGIN, FOCALLY. - TUMOR FOCALLY INVOLVES EPIDERMIS. - LYMPHOVASCULAR INVASION OF DERMAL LYMPHATICS. - ONE OF ONE LYMPH NODES NEGATIVE FOR CARCINOMA (0/1). - SEE ONCOLOGY TABLE. 3. Lymph node, sentinel, biopsy, Right axillary - ONE LYMPH NODE WITH ISOLATED TUMOR CELLS (0/1). 4. Lymph node, sentinel, biopsy, Right - METASTATIC CARCINOMA IN ONE OF ONE LYMPH NODES (1/1). 5. Lymph node, sentinel, biopsy, Right - ONE OF ONE LYMPH NODES NEGATIVE FOR CARCINOMA (0/1). 6. Lymph node, sentinel, biopsy, Right - METASTATIC CARCINOMA IN ONE OF ONE LYMPH NODES (1/1). - EXTRACAPSULAR EXTENSION. 7. Lymph node, sentinel, biopsy, Right - METASTATIC CARCINOMA IN ONE OF ONE LYMPH NODES (1/1). - EXTRACAPSULAR EXTENSION. 8. Lymph node, sentinel, biopsy, Right - ONE OF ONE LYMPH NODES NEGATIVE FOR CARCINOMA (0/1). 9. Lymph node, sentinel, biopsy, Right - ONE OF ONE LYMPH NODES NEGATIVE FOR CARCINOMA (0/1). 10. Lymph node, sentinel, biopsy, Right - METASTATIC CARCINOMA IN ONE OF ONE LYMPH NODES (1/1). -  EXTRACAPSULAR EXTENSION. 11. Lymph node, biopsy, Right axillary - ONE OF ONE LYMPH NODES NEGATIVE FOR CARCINOMA (0/1).       07/22/2017 Receptors her2    Ductal carcinoma in primary breast tumor and node metastasis are ER 95%, PR 95% strongly positive, and HER2 + The lobular component in primary breast tumor is ER 95% positive, PR 95% positive, strong staining, HER-2 negative.       08/25/2017 Imaging    MRI abdomen done at [facility]:  Enhancing structure measing 5 mm is compatible with a flash filling hemangioma. No specific findings to suggest metastatic cancer      08/25/2017 Imaging    MRI Abdomen at [facility] 08/25/17  IMPRESSION: 1. Lesion in segment 7 of the liver has signal and enhancement characteristics compatible with a flash filling hemangioma. No specific findings identified to suggest metastatic disease.      08/29/2017 - 12/12/2017 Chemotherapy    adjuvant TCHP every 3 weeks for 6 cycles starting 08/29/17, followed by maintenance Herceptin with or without Perjeta for 6-12 months. Postponed and decreased Carbo to 663m and added Onpro with cycle 2 on 09/19/17 due to neutropenic fever. Carbplatin further reduced to 5024mstarting with cycle 3 due to thrombocytopenia.          09/04/2017 - 09/09/2017 Hospital Admission    Admit date: 09/04/2017 Discharge date: 09/09/2017  DISCHARGE DIAGNOSES:  Principal Problem:   [facility] Problems:   Malignant neoplasm of upper-outer quadrant  of right breast in female, estrogen receptor positive ([place])   Port-A-Cath in place   Neutropenic fever ([place])   Hyponatremia   Malignant neoplasm of breast in female, estrogen receptor positive ([place])        CURRENT THERAPY: cycle 6 day 8 TCHP  INTERVAL HISTORY: Kymber Kosar 42 y.o. female returns for evaluation after receiving her final TCHP last week. She is doing moderately well.  She has had some issues with tachycardia, and she was evaluated by Dr. Aundra Dubin in cardiology  earlier today and was told that everything is good today.  His note is pending.  She says her heart rate ranged from 117-131. She feels dehydrated.  She has decreased appetite and is fatigued.  She denies any other issues today however.     Patient Active Problem List   Diagnosis Date Noted  . Sepsis ([place]) 09/04/2017  . Neutropenic fever ([place]) 09/04/2017  . Hyponatremia 09/04/2017  . Port-A-Cath in place 08/28/2017  . Genetic testing 06/25/2017  . Family history of breast cancer   . Family history of colon cancer   . Malignant neoplasm of upper-outer quadrant of right breast in female, estrogen receptor positive ([place]) 06/16/2017    has No Known Allergies.  MEDICAL HISTORY: Past Medical History:  Diagnosis Date  . Anxiety   . Cancer [facility])    right breast   . Family history of breast cancer   . Family history of colon cancer   . GERD (gastroesophageal reflux disease)   . Headache    couple of migraines in the past  . History of kidney stones   . Vitamin D deficiency     SURGICAL HISTORY: Past Surgical History:  Procedure Laterality Date  . egg donation    . kidney stone removal    . MASTECTOMY W/ SENTINEL NODE BIOPSY Bilateral 07/22/2017   Procedure: RIGHT TOTAL MASTECTOMY WITH RIGHT AXILLARY SENTINEL LYMPH NODE BIOPSY, LEFT PROPHYLACTIC MASTECTOMY;  Surgeon: [name], MD;  Location: [facility];  Service: General;  Laterality: Bilateral;  . PORTACATH PLACEMENT Right 07/22/2017   Procedure: INSERTION PORT-A-CATH WITH [name];  Surgeon: [name], MD;  Location: [place];  Service: General;  Laterality: Right;    SOCIAL HISTORY: Social History   Socioeconomic History  . Marital status: Single    Spouse name: Not on file  . Number of children: Not on file  . Years of education: Not on file  . Highest education level: Not on file  Social Needs  . Financial resource strain: Not on file  . Food insecurity - worry: Not on file  . Food insecurity - inability: Not on  file  . Transportation needs - medical: Not on file  . Transportation needs - non-medical: Not on file  Occupational History  . Not on file  Tobacco Use  . Smoking status: Never Smoker  . Smokeless tobacco: Former Network engineer and Sexual Activity  . Alcohol use: Yes    Comment: once a year  . Drug use: No  . Sexual activity: Yes  Other Topics Concern  . Not on file  Social History Narrative  . Not on file    FAMILY HISTORY: Family History  Problem Relation Age of Onset  . Breast cancer Paternal Grandmother 71  . Colon cancer Paternal Grandmother 46  . Breast cancer Mother 70  . Prostate cancer Paternal Grandfather        dx in his 39s  . Leukemia Paternal Grandfather   . Lung cancer  Maternal Uncle        heavy smoker  . Other Paternal Uncle        farm accident  . Stroke Maternal Grandfather     Review of Systems  Constitutional: Positive for fatigue. Negative for appetite change, chills, fever and unexpected weight change.  HENT:   Negative for hearing loss and lump/mass.   Eyes: Negative for eye problems and icterus.  Respiratory: Negative for chest tightness, cough and shortness of breath.   Cardiovascular: Negative for chest pain, leg swelling and palpitations.  Gastrointestinal: Negative for abdominal distention, abdominal pain, constipation, diarrhea, nausea and vomiting.  Endocrine: Negative for hot flashes.  Musculoskeletal: Negative for arthralgias.  Skin: Negative for itching and rash.  Neurological: Negative for dizziness, headaches and numbness.  Hematological: Negative for adenopathy. Does not bruise/bleed easily.  Psychiatric/Behavioral: Negative for depression. The patient is not nervous/anxious.       PHYSICAL EXAMINATION  ECOG PERFORMANCE STATUS: 1 - Symptomatic but completely ambulatory  Vitals:   12/19/17 1331  BP: 96/66  Pulse: (!) 141  Resp: 20  Temp: 98.1 F (36.7 C)  SpO2: 100%  HR about 120 when auscultated  Physical Exam   Constitutional: She is oriented to person, place, and time and well-developed, well-nourished, and in no distress.  HENT:  Head: Normocephalic and atraumatic.  Mouth/Throat: Oropharynx is clear and moist. No oropharyngeal exudate.  Eyes: Pupils are equal, round, and reactive to light. No scleral icterus.  Neck: Neck supple.  Cardiovascular: Normal rate, regular rhythm and normal heart sounds.  Pulmonary/Chest: Effort normal and breath sounds normal. No respiratory distress. She has no wheezes. She has no rales. She exhibits no tenderness.  Abdominal: Soft. Bowel sounds are normal. She exhibits no distension and no mass. There is no tenderness. There is no rebound and no guarding.  Musculoskeletal: She exhibits no edema.  Lymphadenopathy:    She has no cervical adenopathy.  Neurological: She is alert and oriented to person, place, and time.  Skin: Skin is warm and dry. No rash noted.  Psychiatric: Mood and affect normal.    LABORATORY DATA:  CBC    Component Value Date/Time   WBC 2.9 (L) 12/19/2017 1240   RBC 3.06 (L) 12/19/2017 1240   HGB 9.4 (L) 12/19/2017 1240   HGB 9.1 (L) 11/21/2017 0949   HCT 30.1 (L) 12/19/2017 1240   HCT 27.5 (L) 11/21/2017 0949   PLT 81 (L) 12/19/2017 1240   PLT 162 11/21/2017 0949   MCV 98.4 12/19/2017 1240   MCV 94.3 11/21/2017 0949   MCH 30.7 12/19/2017 1240   MCHC 31.2 (L) 12/19/2017 1240   RDW 16.8 (H) 12/19/2017 1240   RDW 19.3 (H) 11/21/2017 0949   LYMPHSABS 1.3 12/19/2017 1240   LYMPHSABS 0.9 11/21/2017 0949   MONOABS 0.6 12/19/2017 1240   MONOABS 0.3 11/21/2017 0949   EOSABS 0.0 12/19/2017 1240   EOSABS 0.0 11/21/2017 0949   BASOSABS 0.0 12/19/2017 1240   BASOSABS 0.0 11/21/2017 0949    CMP     Component Value Date/Time   NA 137 12/19/2017 1240   NA 139 11/21/2017 0949   K 3.9 12/19/2017 1240   K 3.7 11/21/2017 0949   CL 104 12/19/2017 1240   CO2 23 12/19/2017 1240   CO2 23 11/21/2017 0949   GLUCOSE 136 12/19/2017 1240    GLUCOSE 225 (H) 11/21/2017 0949   BUN 15 12/19/2017 1240   BUN 10.5 11/21/2017 0949   CREATININE 0.94 12/19/2017 1240   CREATININE  0.9 11/21/2017 0949   CALCIUM 8.8 12/19/2017 1240   CALCIUM 9.1 11/21/2017 0949   PROT 6.4 12/19/2017 1240   PROT 6.5 11/21/2017 0949   ALBUMIN 3.6 12/19/2017 1240   ALBUMIN 3.5 11/21/2017 0949   AST 11 12/19/2017 1240   AST 23 11/21/2017 0949   ALT 12 12/19/2017 1240   ALT 31 11/21/2017 0949   ALKPHOS 71 12/19/2017 1240   ALKPHOS 62 11/21/2017 0949   BILITOT 0.4 12/19/2017 1240   BILITOT 0.40 11/21/2017 0949   GFRNONAA >60 12/19/2017 1240   GFRAA >60 12/19/2017 1240     ASSESSMENT and PLAN:   Malignant neoplasm of upper-outer quadrant of right breast in female, estrogen receptor positive ([place]) 42 year old woman with stage IB triple positive breast cancer diagnosed in 06/2017, treated with bilateral mastectomies, adjuvant chemotherapy with TCHP, followed by maintenance Herceptin +/- Perjeta to start on 01/02/2018.    Triple Positive Breast Cancer: She is cycle 6 day 8 of treatment.  She tolerated it well.  She will return in 2 weeks for maintenance HER2 therapy.  CBC stable.  I reviewed it with her in detail.  She will receive IV fluids today.    Tachycardia: She was evaluated by cardiology this AM.  Note is pending.  She will receive fluids today as ? If it is related to dehydration.    Alyna will return in 2 weeks for labs, f/u with Dr. Burr Medico, and Herceptin.     All questions were answered. The patient knows to call the clinic with any problems, questions or concerns. We can certainly see the patient much sooner if necessary.  A total of (30) minutes of face-to-face time was spent with this patient with greater than 50% of that time in counseling and care-coordination.  This note was electronically signed. Scot Dock, NP 12/19/2017

## 2017-12-19 NOTE — Patient Instructions (Signed)
Your physician has requested that you have an echocardiogram. Echocardiography is a painless test that uses sound waves to create images of your heart. It provides your doctor with information about the size and shape of your heart and how well your heart's chambers and valves are working. This procedure takes approximately one hour. There are no restrictions for this procedure.  Your physician recommends that you schedule a follow-up appointment in: 2 months with Dr. Aundra Dubin  an a echocardiogram

## 2017-12-19 NOTE — Patient Instructions (Signed)

## 2017-12-19 NOTE — Assessment & Plan Note (Signed)
42 year old woman with stage IB triple positive breast cancer diagnosed in 06/2017, treated with bilateral mastectomies, adjuvant chemotherapy with TCHP, followed by maintenance Herceptin +/- Perjeta to start on 01/02/2018.    Triple Positive Breast Cancer: She is cycle 6 day 8 of treatment.  She tolerated it well.  She will return in 2 weeks for maintenance HER2 therapy.  CBC stable.  I reviewed it with her in detail.  She will receive IV fluids today.    Tachycardia: She was evaluated by cardiology this AM.  Note is pending.  She will receive fluids today as ? If it is related to dehydration.    Sheila [name] will return in 2 weeks for labs, f/u with Dr. Burr Medico, and Herceptin.

## 2017-12-20 NOTE — Progress Notes (Signed)
Oncology: Dr. Burr Medico  42 yo with history of breath cancer was referred by Dr. Burr Medico for cardio-oncology evaluation given planned Herceptin use.  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  She had bilateral mastectomies and axillary node biopsy in 7/18.  Planned for Taxol/carboplatin/Herceptin/Perjeta q 3 wks x 6 cycles starting 10/18, then maintenance Herceptin to complete a year.  She has finished the initial chemotherapy.    No history of cardiac problems.  Nonsmoker.   Last echo showed significantly less negative strain though EF was preserved.  I had her stop Herceptin for the last month and start Coreg 3.125 mg bid. She returns for followup today.  Main complaint is fatigue.  No exertional dyspnea or chest pain. She still has sinus tachycardia.  No orthopnea/PND.    PMH: 1. Breast cancer: Diagnosed 7/18 on right, ER+/PR+/HER2+.  She had bilateral mastectomies and axillary node biopsy in 7/18.  Planned for Taxol/carboplatin/Herceptin/Perjeta q 3 wks x 6 cycles starting 10/12, then maintenance Herceptin to complete a year.  - Echo (9/18): EF 63-87%, normal diastolic function, GLS -56.4%.  - Echo (12/18): EF 33-29%, normal diastolic function, normal RV size and systolic function, GLS -51.8%.  - Echo (1/19): EF 60%, GLS -15%, normal RV size and systolic function.   Family History  Problem Relation Age of Onset  . Breast cancer Paternal Grandmother 75  . Colon cancer Paternal Grandmother 83  . Breast cancer Mother 8  . Prostate cancer Paternal Grandfather        dx in his 35s  . Leukemia Paternal Grandfather   . Lung cancer Maternal Uncle        heavy smoker  . Other Paternal Uncle        farm accident  . Stroke Maternal Grandfather    Social History   Socioeconomic History  . Marital status: Single    Spouse name: Not on file  . Number of children: Not on file  . Years of education: Not on file  . Highest education level: Not on file  Social Needs  . Financial  resource strain: Not on file  . Food insecurity - worry: Not on file  . Food insecurity - inability: Not on file  . Transportation needs - medical: Not on file  . Transportation needs - non-medical: Not on file  Occupational History  . Not on file  Tobacco Use  . Smoking status: Never Smoker  . Smokeless tobacco: Former Network engineer and Sexual Activity  . Alcohol use: Yes    Comment: once a year  . Drug use: No  . Sexual activity: Yes  Other Topics Concern  . Not on file  Social History Narrative  . Not on file   ROS: All systems reviewed and negative except as per HPI.  Current Outpatient Medications  Medication Sig Dispense Refill  . calcium carbonate (TUMS - DOSED IN MG ELEMENTAL CALCIUM) 500 MG chewable tablet Chew 2 tablets by mouth as needed for indigestion or heartburn.     . carvedilol (COREG) 3.125 MG tablet Take 1 tablet (3.125 mg total) by mouth 2 (two) times daily. 60 tablet 3  . Cholecalciferol (VITAMIN D) 2000 units tablet Take 2,000 Units by mouth daily.    Marland Kitchen dexamethasone (DECADRON) 4 MG tablet Take 2 tablets (8 mg total) by mouth 2 (two) times daily. Start the day before Taxotere. Then again the day after chemo for 3 days. 30 tablet 1  . diphenoxylate-atropine (LOMOTIL) 2.5-0.025 MG tablet Take 1-2 tablets  by mouth 4 (four) times daily as needed for diarrhea or loose stools. 30 tablet 2  . fluticasone (FLONASE) 50 MCG/ACT nasal spray Place 1 spray into both nostrils daily.    Marland Kitchen lidocaine-prilocaine (EMLA) cream Apply to affected area once 30 g 3  . loperamide (IMODIUM) 2 MG capsule Take 1 capsule (2 mg total) by mouth 3 (three) times daily as needed for diarrhea or loose stools. 30 capsule 0  . LORazepam (ATIVAN) 0.5 MG tablet Take 1 tablet (0.5 mg total) by mouth every 8 (eight) hours as needed for anxiety or sleep (or nausea). 30 tablet 0  . naproxen sodium (ANAPROX) 220 MG tablet Take 220 mg 2 (two) times daily as needed by mouth (pain).     Marland Kitchen omeprazole  (PRILOSEC) 20 MG capsule Take 20 mg by mouth as needed.     . prochlorperazine (COMPAZINE) 10 MG tablet Take 1 tablet (10 mg total) by mouth every 6 (six) hours as needed (Nausea or vomiting). 30 tablet 1  . promethazine (PHENERGAN) 12.5 MG tablet Take 1 tablet (12.5 mg total) every 6 (six) hours as needed by mouth for nausea or vomiting. 30 tablet 2   Current Facility-Administered Medications  Medication Dose Route Frequency Provider Last Rate Last Dose  . gi cocktail (Maalox,Lidocaine,Donnatal)  30 mL Oral Once Tanner, Lucianne Lei E., PA-C       BP 100/71   Pulse (!) 117   Wt 171 lb 12 oz (77.9 kg)   SpO2 100%   BMI 27.72 kg/m  General: NAD Neck: No JVD, no thyromegaly or thyroid nodule.  Lungs: Clear to auscultation bilaterally with normal respiratory effort. CV: Nondisplaced PMI.  Heart mildly tachy, regular S1/S2, no S3/S4, no murmur.  No peripheral edema.  No carotid bruit.  Normal pedal pulses.  Abdomen: Soft, nontender, no hepatosplenomegaly, no distention.  Skin: Intact without lesions or rashes.  Neurologic: Alert and oriented x 3.  Psych: Normal affect. Extremities: No clubbing or cyanosis.  HEENT: Normal.   Assessment/Plan:  42 yo with history of breast cancer, 1 year of Herceptin therapy planned.  Her initial echo was normal. 12/18 echo showed that EF remained 60-65% but global longitudinal strain measurement was significantly less negative, down to -13.5% from -20.2%.  I had her stop Herceptin for a month and she had a repeat echo today.  Today's echo was reviewed, EF remains 60%.  GLS is a bit more negative at -15%.   - I think that she can carefully restart Herceptin.  I will have her get a repeat echo in 2 months.  - Continue Coreg 3.125 mg bid.  No BP room to titrate up or add ARB.   Sheila [name] 12/20/2017

## 2017-12-26 ENCOUNTER — Emergency Department (HOSPITAL_COMMUNITY)
Admission: EM | Admit: 2017-12-26 | Discharge: 2017-12-26 | Disposition: A | Discharge status: Home / Self Care | Payer: PRIVATE HEALTH INSURANCE | Source: Home / Self Care | Attending: Emergency Medicine | Admitting: Emergency Medicine

## 2017-12-26 ENCOUNTER — Other Ambulatory Visit: Payer: Self-pay

## 2017-12-26 ENCOUNTER — Encounter (HOSPITAL_COMMUNITY): Payer: Self-pay

## 2017-12-26 DIAGNOSIS — Z87891 Personal history of nicotine dependence: Secondary | ICD-10-CM | POA: Insufficient documentation

## 2017-12-26 DIAGNOSIS — R110 Nausea: Secondary | ICD-10-CM | POA: Insufficient documentation

## 2017-12-26 DIAGNOSIS — Z85038 Personal history of other malignant neoplasm of large intestine: Secondary | ICD-10-CM

## 2017-12-26 DIAGNOSIS — E876 Hypokalemia: Secondary | ICD-10-CM

## 2017-12-26 DIAGNOSIS — Z79899 Other long term (current) drug therapy: Secondary | ICD-10-CM

## 2017-12-26 DIAGNOSIS — A419 Sepsis, unspecified organism: Secondary | ICD-10-CM | POA: Diagnosis not present

## 2017-12-26 DIAGNOSIS — R197 Diarrhea, unspecified: Secondary | ICD-10-CM

## 2017-12-26 DIAGNOSIS — Z853 Personal history of malignant neoplasm of breast: Secondary | ICD-10-CM

## 2017-12-26 DIAGNOSIS — R0602 Shortness of breath: Secondary | ICD-10-CM | POA: Diagnosis not present

## 2017-12-26 LAB — COMPREHENSIVE METABOLIC PANEL
ALBUMIN: 3.7 g/dL (ref 3.5–5.0)
ALK PHOS: 80 U/L (ref 38–126)
ALT: 21 U/L (ref 14–54)
AST: 23 U/L (ref 15–41)
Anion gap: 10 (ref 5–15)
BILIRUBIN TOTAL: 0.2 mg/dL — AB (ref 0.3–1.2)
BUN: 5 mg/dL — AB (ref 6–20)
CALCIUM: 8.9 mg/dL (ref 8.9–10.3)
CO2: 25 mmol/L (ref 22–32)
Chloride: 105 mmol/L (ref 101–111)
Creatinine, Ser: 0.94 mg/dL (ref 0.44–1.00)
GFR calc Af Amer: >60 mL/min (ref >60)
GFR calc non Af Amer: >60 mL/min (ref >60)
Glucose, Bld: 104 mg/dL — ABNORMAL HIGH (ref 65–99)
Potassium: 3.2 mmol/L — ABNORMAL LOW (ref 3.5–5.1)
Sodium: 140 mmol/L (ref 135–145)
TOTAL PROTEIN: 6.5 g/dL (ref 6.5–8.1)

## 2017-12-26 LAB — I-STAT BETA HCG BLOOD, ED (MC, WL, AP ONLY): I-stat hCG, quantitative: <5 m[IU]/mL (ref <5)

## 2017-12-26 LAB — CBC
HEMATOCRIT: 42.9 % (ref 36.0–46.0)
Hemoglobin: 13.7 g/dL (ref 12.0–15.0)
MCH: 31.2 pg (ref 26.0–34.0)
MCHC: 31.9 g/dL (ref 30.0–36.0)
MCV: 97.7 fL (ref 78.0–100.0)
Platelets: 100 10*3/uL — ABNORMAL LOW (ref 150–400)
RBC: 4.39 MIL/uL (ref 3.87–5.11)
RDW: 17.4 % — AB (ref 11.5–15.5)
WBC: 4.8 10*3/uL (ref 4.0–10.5)

## 2017-12-26 LAB — I-STAT CG4 LACTIC ACID, ED: Lactic Acid, Venous: 1.04 mmol/L (ref 0.5–1.9)

## 2017-12-26 LAB — INFLUENZA PANEL BY PCR (TYPE A & B)
INFLBPCR: NEGATIVE
Influenza A By PCR: NEGATIVE

## 2017-12-26 LAB — MAGNESIUM: Magnesium: 1.9 mg/dL (ref 1.7–2.4)

## 2017-12-26 LAB — I-STAT TROPONIN, ED: Troponin i, poc: 0 ng/mL (ref 0.00–0.08)

## 2017-12-26 LAB — LIPASE, BLOOD: Lipase: 31 U/L (ref 11–51)

## 2017-12-26 MED ORDER — HEPARIN SOD (PORK) LOCK FLUSH 100 UNIT/ML IV SOLN
500.0000 [IU] | Freq: Once | INTRAVENOUS | Status: AC
  Administered 2017-12-26: 500 [IU]
  Filled 2017-12-26: qty 5

## 2017-12-26 MED ORDER — ONDANSETRON HCL 4 MG/2ML IJ SOLN: 4.0000 mg | Freq: Once | INTRAMUSCULAR | Status: DC

## 2017-12-26 MED ORDER — SODIUM CHLORIDE 0.9 % IV BOLUS (SEPSIS)
1000.0000 mL | Freq: Once | INTRAVENOUS | Status: AC
  Administered 2017-12-26: 1000 mL via INTRAVENOUS

## 2017-12-26 MED ORDER — POTASSIUM CHLORIDE CRYS ER 20 MEQ PO TBCR: 40.0000 meq | EXTENDED_RELEASE_TABLET | Freq: Once | ORAL | Status: DC

## 2017-12-26 MED ORDER — PROCHLORPERAZINE EDISYLATE 5 MG/ML IJ SOLN
10.0000 mg | Freq: Once | INTRAMUSCULAR | Status: AC
Start: 2017-12-26 — End: 2017-12-26
  Administered 2017-12-26: 10 mg via INTRAVENOUS
  Filled 2017-12-26: qty 2

## 2017-12-26 NOTE — ED Notes (Signed)
Bed: WA08 Expected date:  Expected time:  Means of arrival:  Comments: TR 3

## 2017-12-26 NOTE — Discharge Instructions (Signed)
Stay hydrated. Continue your phenergan and compazine at home as needed.   Your potassium is 3.2 today. Recheck with your doctor in a week if you still have poor appetite and diarrhea.   See your oncologist and primary care doctor.   Avoid any flu exposure.   Return to ER if you have dehydration, persistent vomiting, fever, severe abdominal pain

## 2017-12-26 NOTE — ED Triage Notes (Signed)
Pt reports diarrhea since Sunday. She was treating in with lomotil, however, today lomotil did not work. She states that she feel weak and she feels like her potassium is low. She also endorses recent contact with someone with H1N1. Last chemo was 2 weeks ago. A&Ox4.

## 2017-12-26 NOTE — ED Notes (Signed)
Pt took her own supply of potassium.

## 2017-12-26 NOTE — ED Notes (Signed)
Pt is alert and oriented x 4 and is verbally responsive. Pt denies pain and reports that she has some nausea . Pt is escorted with Brother in [employer].

## 2017-12-26 NOTE — ED Provider Notes (Signed)
[place] DEPT Provider Note   CSN: [id number] Arrival date & time: 12/26/17  1814     History   Chief Complaint Chief Complaint  Patient presents with  . Diarrhea  . Weakness    HPI Sheila [name] is a 42 y.o. female history of breast cancer on chemotherapy and Herceptin, here presenting with nausea, loose stools.  Patient states that he is been nauseated for the last week or so.  She has been having loose stools twice a day for the last week.  Denies any watery diarrhea or fevers.  She states that her husband just went to [facility] and had confirmed flu earlier today.  She denies any fevers or cough.  Denies any urinary symptoms.  Of note, patient had chemotherapy 2 weeks ago.  She also has chronic tachycardia that has been evaluated by cardiology recently and was thought to be secondary to her chemotherapy.  Denies any chest pain or shortness of breath.  The history is provided by the patient.    Past Medical History:  Diagnosis Date  . Anxiety   . Cancer [facility])    right breast   . Family history of breast cancer   . Family history of colon cancer   . GERD (gastroesophageal reflux disease)   . Headache    couple of migraines in the past  . History of kidney stones   . Vitamin D deficiency     Patient Active Problem List   Diagnosis Date Noted  . Sepsis ([place]) 09/04/2017  . Neutropenic fever ([place]) 09/04/2017  . Hyponatremia 09/04/2017  . Port-A-Cath in place 08/28/2017  . Genetic testing 06/25/2017  . Family history of breast cancer   . Family history of colon cancer   . Malignant neoplasm of upper-outer quadrant of right breast in female, estrogen receptor positive ([place]) 06/16/2017    Past Surgical History:  Procedure Laterality Date  . egg donation    . kidney stone removal    . MASTECTOMY W/ SENTINEL NODE BIOPSY Bilateral 07/22/2017   Procedure: RIGHT TOTAL MASTECTOMY WITH RIGHT AXILLARY SENTINEL LYMPH NODE BIOPSY, LEFT  PROPHYLACTIC MASTECTOMY;  Surgeon: [name], MD;  Location: [place];  Service: General;  Laterality: Bilateral;  . PORTACATH PLACEMENT Right 07/22/2017   Procedure: INSERTION PORT-A-CATH WITH [name];  Surgeon: [name], MD;  Location: [place];  Service: General;  Laterality: Right;    OB History    No data available       Home Medications    Prior to Admission medications   Medication Sig Start Date End Date Taking? Authorizing Provider  calcium carbonate (TUMS - DOSED IN MG ELEMENTAL CALCIUM) 500 MG chewable tablet Chew 2 tablets by mouth as needed for indigestion or heartburn.    Yes [provider]  carvedilol (COREG) 3.125 MG tablet Take 1 tablet (3.125 mg total) by mouth 2 (two) times daily. 11/17/17 02/15/18 Yes Larey Dresser, MD  Cholecalciferol (VITAMIN D) 2000 units tablet Take 2,000 Units by mouth daily.   Yes [provider]  dexamethasone (DECADRON) 4 MG tablet Take 2 tablets (8 mg total) by mouth 2 (two) times daily. Start the day before Taxotere. Then again the day after chemo for 3 days. 08/05/17  Yes [name], MD  diphenoxylate-atropine (LOMOTIL) 2.5-0.025 MG tablet Take 1-2 tablets by mouth 4 (four) times daily as needed for diarrhea or loose stools. 09/19/17  Yes [name], MD  fluticasone (FLONASE) 50 MCG/ACT nasal spray Place 1 spray into  both nostrils daily.   Yes [provider]  lidocaine-prilocaine (EMLA) cream Apply to affected area once 08/05/17  Yes [name], MD  LORazepam (ATIVAN) 0.5 MG tablet Take 1 tablet (0.5 mg total) by mouth every 8 (eight) hours as needed for anxiety or sleep (or nausea). 12/12/17  Yes [name], MD  naproxen sodium (ANAPROX) 220 MG tablet Take 220 mg 2 (two) times daily as needed by mouth (pain).    Yes [provider]  omeprazole (PRILOSEC) 20 MG capsule Take 20 mg by mouth as needed.    Yes [provider]  promethazine (PHENERGAN) 12.5 MG tablet Take 1 tablet (12.5 mg total) every  6 (six) hours as needed by mouth for nausea or vomiting. 09/26/17  Yes [name], MD  loperamide (IMODIUM) 2 MG capsule Take 1 capsule (2 mg total) by mouth 3 (three) times daily as needed for diarrhea or loose stools. Patient not taking: Reported on 12/26/2017 09/09/17   Bonnielee Haff, MD  prochlorperazine (COMPAZINE) 10 MG tablet Take 1 tablet (10 mg total) by mouth every 6 (six) hours as needed (Nausea or vomiting). 12/12/17   [name], MD    Family History Family History  Problem Relation Age of Onset  . Breast cancer Paternal Grandmother 31  . Colon cancer Paternal Grandmother 66  . Breast cancer Mother 43  . Prostate cancer Paternal Grandfather        dx in his 51s  . Leukemia Paternal Grandfather   . Lung cancer Maternal Uncle        heavy smoker  . Other Paternal Uncle        farm accident  . Stroke Maternal Grandfather     Social History Social History   Tobacco Use  . Smoking status: Never Smoker  . Smokeless tobacco: Former Network engineer Use Topics  . Alcohol use: Yes    Comment: once a year  . Drug use: No     Allergies   Patient has no known allergies.   Review of Systems Review of Systems  Gastrointestinal: Positive for diarrhea.  Neurological: Positive for weakness.  All other systems reviewed and are negative.    Physical Exam Updated Vital Signs BP 109/85 (BP Location: Left Arm)   Pulse (!) 128   Temp 98.5 F (36.9 C) (Oral)   Resp 18   Ht 5\' 6"  (1.676 m)   Wt 77.1 kg (170 lb)   SpO2 100%   BMI 27.44 kg/m   Physical Exam  Constitutional: She is oriented to person, place, and time.  Slightly dehydrated, chronically ill   HENT:  Head: Normocephalic.  MM slightly dry   Eyes: Conjunctivae and EOM are normal. Pupils are equal, round, and reactive to light.  Neck: Normal range of motion. Neck supple.  Cardiovascular: Regular rhythm and normal heart sounds.  Slightly tachycardic   Pulmonary/Chest: Effort normal and breath sounds normal.  No stridor. No respiratory distress.  R port in place   Abdominal: Soft. Bowel sounds are normal. She exhibits no distension. There is no tenderness. There is no guarding.  Musculoskeletal: Normal range of motion.  Neurological: She is alert and oriented to person, place, and time.  Skin: Skin is warm.  Psychiatric: She has a normal mood and affect.  Nursing note and vitals reviewed.    ED Treatments / Results  Labs (all labs ordered are listed, but only abnormal results are displayed) Labs Reviewed  COMPREHENSIVE METABOLIC PANEL - Abnormal; Notable for the following  components:      Result Value   Potassium 3.2 (*)    Glucose, Bld 104 (*)    BUN 5 (*)    Total Bilirubin 0.2 (*)    All other components within normal limits  CBC - Abnormal; Notable for the following components:   RDW 17.4 (*)    Platelets 100 (*)    All other components within normal limits  LIPASE, BLOOD  MAGNESIUM  INFLUENZA PANEL BY PCR (TYPE A & B)  URINALYSIS, ROUTINE W REFLEX MICROSCOPIC  I-STAT BETA HCG BLOOD, ED ([facility], [facility], [facility] ONLY)  I-STAT CG4 LACTIC ACID, ED  I-STAT TROPONIN, ED    EKG  EKG Interpretation None       Radiology No results found.  Procedures Procedures (including critical care time)  Medications Ordered in ED Medications  potassium chloride SA (K-DUR,KLOR-CON) CR tablet 40 mEq (not administered)  sodium chloride 0.9 % bolus 1,000 mL (1,000 mLs Intravenous New Bag/Given 12/26/17 2100)  prochlorperazine (COMPAZINE) injection 10 mg (10 mg Intravenous Given 12/26/17 2207)     Initial Impression / Assessment and Plan / ED Course  I have reviewed the triage vital signs and the nursing notes.  Pertinent labs & imaging results that were available during my care of the patient were reviewed by me and considered in my medical decision making (see chart for details).     Sheila [name] is a 42 y.o. female here with diarrhea, nausea. Abdomen nontender. I think likely gastro vs flu  syndrome (husband has confirmed flu today). Afebrile, doesn't appear septic. Will get labs, hydrate patient.   10:26 PM Labs showed K 3.2. She took 40 meq potassium that she has at home. Given IVF and compazine and able to keep food down. Magnesium level normal. Flu negative. We discussed about giving low dose tamiflu for prophylaxis since her husband has flu but since she has negative flu swab and has nausea already, will hold off for now. HR down to 102 from 128. Stable for discharge.   Final Clinical Impressions(s) / ED Diagnoses   Final diagnoses:  None    ED Discharge Orders    None       Drenda Freeze, MD 12/26/17 2228

## 2017-12-28 ENCOUNTER — Emergency Department (HOSPITAL_COMMUNITY): Payer: PRIVATE HEALTH INSURANCE

## 2017-12-28 ENCOUNTER — Other Ambulatory Visit: Payer: Self-pay

## 2017-12-28 ENCOUNTER — Inpatient Hospital Stay (HOSPITAL_COMMUNITY)
Admission: EM | Admit: 2017-12-28 | Discharge: 2018-01-05 | DRG: 871 | Disposition: A | Discharge status: Home / Self Care | Payer: PRIVATE HEALTH INSURANCE | Attending: Family Medicine | Admitting: Family Medicine

## 2017-12-28 ENCOUNTER — Encounter (HOSPITAL_COMMUNITY): Payer: Self-pay | Admitting: *Deleted

## 2017-12-28 DIAGNOSIS — Z170 Estrogen receptor positive status [ER+]: Secondary | ICD-10-CM

## 2017-12-28 DIAGNOSIS — Z87891 Personal history of nicotine dependence: Secondary | ICD-10-CM

## 2017-12-28 DIAGNOSIS — E872 Acidosis, unspecified: Secondary | ICD-10-CM | POA: Diagnosis present

## 2017-12-28 DIAGNOSIS — J1008 Influenza due to other identified influenza virus with other specified pneumonia: Secondary | ICD-10-CM | POA: Diagnosis present

## 2017-12-28 DIAGNOSIS — I959 Hypotension, unspecified: Secondary | ICD-10-CM | POA: Diagnosis not present

## 2017-12-28 DIAGNOSIS — R197 Diarrhea, unspecified: Secondary | ICD-10-CM | POA: Diagnosis not present

## 2017-12-28 DIAGNOSIS — C7801 Secondary malignant neoplasm of right lung: Secondary | ICD-10-CM | POA: Diagnosis present

## 2017-12-28 DIAGNOSIS — R918 Other nonspecific abnormal finding of lung field: Secondary | ICD-10-CM

## 2017-12-28 DIAGNOSIS — D72819 Decreased white blood cell count, unspecified: Secondary | ICD-10-CM | POA: Diagnosis not present

## 2017-12-28 DIAGNOSIS — R000 Tachycardia, unspecified: Secondary | ICD-10-CM | POA: Diagnosis not present

## 2017-12-28 DIAGNOSIS — J1100 Influenza due to unidentified influenza virus with unspecified type of pneumonia: Secondary | ICD-10-CM | POA: Diagnosis not present

## 2017-12-28 DIAGNOSIS — T451X5A Adverse effect of antineoplastic and immunosuppressive drugs, initial encounter: Secondary | ICD-10-CM | POA: Diagnosis present

## 2017-12-28 DIAGNOSIS — R001 Bradycardia, unspecified: Secondary | ICD-10-CM | POA: Diagnosis not present

## 2017-12-28 DIAGNOSIS — R5084 Febrile nonhemolytic transfusion reaction: Secondary | ICD-10-CM | POA: Diagnosis not present

## 2017-12-28 DIAGNOSIS — Z803 Family history of malignant neoplasm of breast: Secondary | ICD-10-CM | POA: Diagnosis not present

## 2017-12-28 DIAGNOSIS — R0602 Shortness of breath: Secondary | ICD-10-CM | POA: Diagnosis present

## 2017-12-28 DIAGNOSIS — D6959 Other secondary thrombocytopenia: Secondary | ICD-10-CM | POA: Diagnosis present

## 2017-12-28 DIAGNOSIS — Z801 Family history of malignant neoplasm of trachea, bronchus and lung: Secondary | ICD-10-CM

## 2017-12-28 DIAGNOSIS — R339 Retention of urine, unspecified: Secondary | ICD-10-CM | POA: Diagnosis not present

## 2017-12-28 DIAGNOSIS — J9601 Acute respiratory failure with hypoxia: Secondary | ICD-10-CM | POA: Diagnosis present

## 2017-12-28 DIAGNOSIS — J189 Pneumonia, unspecified organism: Secondary | ICD-10-CM | POA: Diagnosis present

## 2017-12-28 DIAGNOSIS — Z9013 Acquired absence of bilateral breasts and nipples: Secondary | ICD-10-CM

## 2017-12-28 DIAGNOSIS — J09X1 Influenza due to identified novel influenza A virus with pneumonia: Secondary | ICD-10-CM | POA: Diagnosis not present

## 2017-12-28 DIAGNOSIS — J101 Influenza due to other identified influenza virus with other respiratory manifestations: Secondary | ICD-10-CM | POA: Diagnosis not present

## 2017-12-28 DIAGNOSIS — K521 Toxic gastroenteritis and colitis: Secondary | ICD-10-CM | POA: Diagnosis not present

## 2017-12-28 DIAGNOSIS — E876 Hypokalemia: Secondary | ICD-10-CM | POA: Diagnosis present

## 2017-12-28 DIAGNOSIS — J1000 Influenza due to other identified influenza virus with unspecified type of pneumonia: Secondary | ICD-10-CM | POA: Diagnosis not present

## 2017-12-28 DIAGNOSIS — Z853 Personal history of malignant neoplasm of breast: Secondary | ICD-10-CM

## 2017-12-28 DIAGNOSIS — K219 Gastro-esophageal reflux disease without esophagitis: Secondary | ICD-10-CM | POA: Diagnosis present

## 2017-12-28 DIAGNOSIS — D6481 Anemia due to antineoplastic chemotherapy: Secondary | ICD-10-CM | POA: Diagnosis present

## 2017-12-28 DIAGNOSIS — R0902 Hypoxemia: Secondary | ICD-10-CM

## 2017-12-28 DIAGNOSIS — R5081 Fever presenting with conditions classified elsewhere: Secondary | ICD-10-CM | POA: Diagnosis not present

## 2017-12-28 DIAGNOSIS — Z959 Presence of cardiac and vascular implant and graft, unspecified: Secondary | ICD-10-CM | POA: Diagnosis not present

## 2017-12-28 DIAGNOSIS — B999 Unspecified infectious disease: Secondary | ICD-10-CM

## 2017-12-28 DIAGNOSIS — A4189 Other specified sepsis: Secondary | ICD-10-CM | POA: Diagnosis not present

## 2017-12-28 DIAGNOSIS — E860 Dehydration: Secondary | ICD-10-CM | POA: Diagnosis present

## 2017-12-28 DIAGNOSIS — C50411 Malignant neoplasm of upper-outer quadrant of right female breast: Secondary | ICD-10-CM | POA: Diagnosis not present

## 2017-12-28 DIAGNOSIS — Z95828 Presence of other vascular implants and grafts: Secondary | ICD-10-CM | POA: Diagnosis not present

## 2017-12-28 DIAGNOSIS — A419 Sepsis, unspecified organism: Secondary | ICD-10-CM | POA: Diagnosis present

## 2017-12-28 LAB — URINALYSIS, ROUTINE W REFLEX MICROSCOPIC
Bilirubin Urine: NEGATIVE
GLUCOSE, UA: NEGATIVE mg/dL
Hgb urine dipstick: NEGATIVE
KETONES UR: NEGATIVE mg/dL
LEUKOCYTES UA: NEGATIVE
NITRITE: NEGATIVE
PH: 6 (ref 5.0–8.0)
Protein, ur: NEGATIVE mg/dL
SPECIFIC GRAVITY, URINE: 1.011 (ref 1.005–1.030)

## 2017-12-28 LAB — COMPREHENSIVE METABOLIC PANEL
ALT: 17 U/L (ref 14–54)
AST: 22 U/L (ref 15–41)
Albumin: 3.4 g/dL — ABNORMAL LOW (ref 3.5–5.0)
Alkaline Phosphatase: 59 U/L (ref 38–126)
Anion gap: 7 (ref 5–15)
BUN: 9 mg/dL (ref 6–20)
CHLORIDE: 104 mmol/L (ref 101–111)
CO2: 24 mmol/L (ref 22–32)
CREATININE: 1.19 mg/dL — AB (ref 0.44–1.00)
Calcium: 8.6 mg/dL — ABNORMAL LOW (ref 8.9–10.3)
GFR calc Af Amer: >60 mL/min (ref >60)
GFR calc non Af Amer: 57 mL/min — ABNORMAL LOW (ref >60)
Glucose, Bld: 113 mg/dL — ABNORMAL HIGH (ref 65–99)
POTASSIUM: 3.9 mmol/L (ref 3.5–5.1)
Sodium: 135 mmol/L (ref 135–145)
Total Bilirubin: 0.4 mg/dL (ref 0.3–1.2)
Total Protein: 6.1 g/dL — ABNORMAL LOW (ref 6.5–8.1)

## 2017-12-28 LAB — LACTIC ACID, PLASMA: LACTIC ACID, VENOUS: 2.2 mmol/L — AB (ref 0.5–1.9)

## 2017-12-28 LAB — CBC WITH DIFFERENTIAL/PLATELET
Basophils Absolute: 0 10*3/uL (ref 0.0–0.1)
Basophils Relative: 0 %
Eosinophils Absolute: 0 10*3/uL (ref 0.0–0.7)
Eosinophils Relative: 0 %
HEMATOCRIT: 26.5 % — AB (ref 36.0–46.0)
HEMOGLOBIN: 8.6 g/dL — AB (ref 12.0–15.0)
LYMPHS ABS: 0.7 10*3/uL (ref 0.7–4.0)
LYMPHS PCT: 10 %
MCH: 31.9 pg (ref 26.0–34.0)
MCHC: 32.5 g/dL (ref 30.0–36.0)
MCV: 98.1 fL (ref 78.0–100.0)
MONOS PCT: 9 %
Monocytes Absolute: 0.6 10*3/uL (ref 0.1–1.0)
Neutro Abs: 5.3 10*3/uL (ref 1.7–7.7)
Neutrophils Relative %: 81 %
Platelets: 124 10*3/uL — ABNORMAL LOW (ref 150–400)
RBC: 2.7 MIL/uL — AB (ref 3.87–5.11)
RDW: 18 % — AB (ref 11.5–15.5)
WBC: 6.6 10*3/uL (ref 4.0–10.5)

## 2017-12-28 LAB — PROCALCITONIN: Procalcitonin: <0.1 ng/mL

## 2017-12-28 LAB — INFLUENZA PANEL BY PCR (TYPE A & B)
Influenza A By PCR: POSITIVE — AB
Influenza B By PCR: NEGATIVE

## 2017-12-28 LAB — I-STAT BETA HCG BLOOD, ED (MC, WL, AP ONLY): I-stat hCG, quantitative: <5 m[IU]/mL (ref <5)

## 2017-12-28 LAB — TSH: TSH: 0.692 u[IU]/mL (ref 0.350–4.500)

## 2017-12-28 LAB — I-STAT CG4 LACTIC ACID, ED: Lactic Acid, Venous: 0.37 mmol/L — ABNORMAL LOW (ref 0.5–1.9)

## 2017-12-28 MED ORDER — SODIUM CHLORIDE 0.9 % IV SOLN
INTRAVENOUS | Status: DC
  Administered 2017-12-28 – 2017-12-31 (×9): via INTRAVENOUS

## 2017-12-28 MED ORDER — IPRATROPIUM BROMIDE 0.02 % IN SOLN: 0.5000 mg | Freq: Four times a day (QID) | RESPIRATORY_TRACT | Status: DC

## 2017-12-28 MED ORDER — LORAZEPAM 0.5 MG PO TABS
0.5000 mg | ORAL_TABLET | Freq: Three times a day (TID) | ORAL | Status: DC | PRN
  Administered 2017-12-29 – 2018-01-04 (×6): 0.5 mg via ORAL
  Filled 2017-12-28 (×6): qty 1

## 2017-12-28 MED ORDER — HEPARIN SODIUM (PORCINE) 5000 UNIT/ML IJ SOLN
5000.0000 [IU] | Freq: Three times a day (TID) | INTRAMUSCULAR | Status: DC
  Administered 2017-12-28 – 2018-01-01 (×11): 5000 [IU] via SUBCUTANEOUS
  Filled 2017-12-28 (×9): qty 1

## 2017-12-28 MED ORDER — SODIUM CHLORIDE 0.9% FLUSH
10.0000 mL | INTRAVENOUS | Status: DC | PRN
  Administered 2017-12-29: 20 mL
  Filled 2017-12-28: qty 40

## 2017-12-28 MED ORDER — PIPERACILLIN-TAZOBACTAM 3.375 G IVPB
3.3750 g | Freq: Three times a day (TID) | INTRAVENOUS | Status: AC
  Administered 2017-12-28 – 2018-01-05 (×24): 3.375 g via INTRAVENOUS
  Filled 2017-12-28 (×23): qty 50

## 2017-12-28 MED ORDER — SODIUM CHLORIDE 0.9 % IV BOLUS (SEPSIS)
2000.0000 mL | Freq: Once | INTRAVENOUS | Status: AC
  Administered 2017-12-28: 2000 mL via INTRAVENOUS

## 2017-12-28 MED ORDER — ONDANSETRON HCL 4 MG PO TABS: 4.0000 mg | ORAL_TABLET | Freq: Four times a day (QID) | ORAL | Status: DC | PRN

## 2017-12-28 MED ORDER — ACETAMINOPHEN 650 MG RE SUPP: 650.0000 mg | Freq: Four times a day (QID) | RECTAL | Status: DC | PRN

## 2017-12-28 MED ORDER — PIPERACILLIN-TAZOBACTAM 3.375 G IVPB 30 MIN
3.3750 g | Freq: Once | INTRAVENOUS | Status: DC
  Filled 2017-12-28 (×2): qty 50

## 2017-12-28 MED ORDER — MIRTAZAPINE 15 MG PO TABS
30.0000 mg | ORAL_TABLET | Freq: Every day | ORAL | Status: DC
  Administered 2017-12-28 – 2017-12-30 (×2): 30 mg via ORAL
  Filled 2017-12-28 (×3): qty 2

## 2017-12-28 MED ORDER — PROCHLORPERAZINE MALEATE 10 MG PO TABS
10.0000 mg | ORAL_TABLET | Freq: Four times a day (QID) | ORAL | Status: DC | PRN
  Administered 2017-12-28 – 2018-01-05 (×5): 10 mg via ORAL
  Filled 2017-12-28 (×5): qty 1

## 2017-12-28 MED ORDER — FLUTICASONE PROPIONATE 50 MCG/ACT NA SUSP
1.0000 | Freq: Every day | NASAL | Status: DC
Start: 2017-12-28 — End: 2018-01-05
  Administered 2017-12-28 – 2018-01-03 (×7): 1 via NASAL
  Filled 2017-12-28: qty 16

## 2017-12-28 MED ORDER — ACETAMINOPHEN 325 MG PO TABS
650.0000 mg | ORAL_TABLET | Freq: Four times a day (QID) | ORAL | Status: DC | PRN
  Administered 2017-12-28 – 2017-12-30 (×4): 650 mg via ORAL
  Filled 2017-12-28 (×4): qty 2

## 2017-12-28 MED ORDER — ONDANSETRON HCL 4 MG/2ML IJ SOLN: 4.0000 mg | Freq: Four times a day (QID) | INTRAMUSCULAR | Status: DC | PRN

## 2017-12-28 MED ORDER — BENZONATATE 100 MG PO CAPS
100.0000 mg | ORAL_CAPSULE | Freq: Three times a day (TID) | ORAL | Status: DC
  Administered 2017-12-28 – 2018-01-05 (×24): 100 mg via ORAL
  Filled 2017-12-28 (×25): qty 1

## 2017-12-28 MED ORDER — HYDROCODONE-ACETAMINOPHEN 5-325 MG PO TABS: 1.0000 | ORAL_TABLET | ORAL | Status: DC | PRN

## 2017-12-28 MED ORDER — VANCOMYCIN HCL IN DEXTROSE 1-5 GM/200ML-% IV SOLN
1000.0000 mg | Freq: Once | INTRAVENOUS | Status: AC
Start: 2017-12-28 — End: 2017-12-28
  Administered 2017-12-28: 1000 mg via INTRAVENOUS
  Filled 2017-12-28: qty 200

## 2017-12-28 MED ORDER — OSELTAMIVIR PHOSPHATE 75 MG PO CAPS
75.0000 mg | ORAL_CAPSULE | Freq: Two times a day (BID) | ORAL | Status: AC
  Administered 2017-12-28 – 2018-01-02 (×10): 75 mg via ORAL
  Filled 2017-12-28 (×12): qty 1

## 2017-12-28 MED ORDER — VITAMIN D3 25 MCG (1000 UNIT) PO TABS
2000.0000 [IU] | ORAL_TABLET | Freq: Every day | ORAL | Status: DC
  Administered 2018-01-02 – 2018-01-04 (×2): 2000 [IU] via ORAL
  Filled 2017-12-28 (×8): qty 2

## 2017-12-28 MED ORDER — IOPAMIDOL (ISOVUE-300) INJECTION 61%
INTRAVENOUS | Status: AC
  Administered 2017-12-28: 75 mL via INTRAVENOUS
  Filled 2017-12-28: qty 75

## 2017-12-28 MED ORDER — CARVEDILOL 3.125 MG PO TABS
3.1250 mg | ORAL_TABLET | Freq: Two times a day (BID) | ORAL | Status: DC
  Administered 2017-12-29: 3.125 mg via ORAL
  Filled 2017-12-28 (×4): qty 1

## 2017-12-28 MED ORDER — GUAIFENESIN-DM 100-10 MG/5ML PO SYRP
5.0000 mL | ORAL_SOLUTION | ORAL | Status: DC | PRN
  Administered 2017-12-29 – 2018-01-02 (×3): 5 mL via ORAL
  Filled 2017-12-28 (×3): qty 10

## 2017-12-28 MED ORDER — SODIUM CHLORIDE 0.9 % IJ SOLN
INTRAMUSCULAR | Status: AC
  Filled 2017-12-28: qty 50

## 2017-12-28 NOTE — ED Provider Notes (Signed)
[facility] DEPT Provider Note   CSN: [id number] Arrival date & time: 12/28/17  1036     History   Chief Complaint Chief Complaint  Patient presents with  . Fever  . Cough  . Headache    HPI Sheila [name] is a 42 y.o. female.  HPI Patient presents to the emergency department with fever headache cough and weakness started this past Friday.  The patient states she was seen in the emergency department for nausea and diarrhea with vomiting on Friday.  Patient states she did feel some better when she was discharged from the ER and then her symptoms got worse over the last 24 hours.  States her husband was recently admitted to the hospital for influenza.  Patient states she finished a round of chemotherapy within the last 2 weeks.  Patient states she is due to continue an infusion coming up in the next couple of weeks.  Patient states that nothing seems to make the condition better.  She states that activity seems to make her feel weaker. Past Medical History:  Diagnosis Date  . Anxiety   . Cancer [facility])    right breast   . Family history of breast cancer   . Family history of colon cancer   . GERD (gastroesophageal reflux disease)   . Headache    couple of migraines in the past  . History of kidney stones   . Vitamin D deficiency     Patient Active Problem List   Diagnosis Date Noted  . Sepsis ([place]) 09/04/2017  . Neutropenic fever ([place]) 09/04/2017  . Hyponatremia 09/04/2017  . Port-A-Cath in place 08/28/2017  . Genetic testing 06/25/2017  . Family history of breast cancer   . Family history of colon cancer   . Malignant neoplasm of upper-outer quadrant of right breast in female, estrogen receptor positive ([place]) 06/16/2017    Past Surgical History:  Procedure Laterality Date  . egg donation    . kidney stone removal    . MASTECTOMY W/ SENTINEL NODE BIOPSY Bilateral 07/22/2017   Procedure: RIGHT TOTAL MASTECTOMY WITH RIGHT AXILLARY  SENTINEL LYMPH NODE BIOPSY, LEFT PROPHYLACTIC MASTECTOMY;  Surgeon: [name], MD;  Location: [facility];  Service: General;  Laterality: Bilateral;  . PORTACATH PLACEMENT Right 07/22/2017   Procedure: INSERTION PORT-A-CATH WITH [name];  Surgeon: [name], MD;  Location: [facility];  Service: General;  Laterality: Right;    OB History    No data available       Home Medications    Prior to Admission medications   Medication Sig Start Date End Date Taking? Authorizing Provider  carvedilol (COREG) 3.125 MG tablet Take 1 tablet (3.125 mg total) by mouth 2 (two) times daily. 11/17/17 02/15/18 Yes Larey Dresser, MD  Cholecalciferol (VITAMIN D) 2000 units tablet Take 2,000 Units by mouth daily.   Yes [provider]  dexamethasone (DECADRON) 4 MG tablet Take 2 tablets (8 mg total) by mouth 2 (two) times daily. Start the day before Taxotere. Then again the day after chemo for 3 days. 08/05/17  Yes [name], MD  diphenoxylate-atropine (LOMOTIL) 2.5-0.025 MG tablet Take 1-2 tablets by mouth 4 (four) times daily as needed for diarrhea or loose stools. 09/19/17  Yes [name], MD  fluticasone (FLONASE) 50 MCG/ACT nasal spray Place 1 spray into both nostrils daily.   Yes [provider]  lidocaine-prilocaine (EMLA) cream Apply to affected area once Patient taking differently: Apply 1 application topically once as needed (  port access). Apply to affected area once 08/05/17  Yes [name], MD  LORazepam (ATIVAN) 0.5 MG tablet Take 1 tablet (0.5 mg total) by mouth every 8 (eight) hours as needed for anxiety or sleep (or nausea). Patient taking differently: Take 0.5 mg by mouth daily as needed for anxiety or sleep (or nausea).  12/12/17  Yes [name], MD  mirtazapine (REMERON) 30 MG tablet Take 30 mg by mouth at bedtime.   Yes [provider]  naproxen sodium (ANAPROX) 220 MG tablet Take 220 mg 2 (two) times daily as needed by mouth (pain).    Yes [provider]    prochlorperazine (COMPAZINE) 10 MG tablet Take 1 tablet (10 mg total) by mouth every 6 (six) hours as needed (Nausea or vomiting). 12/12/17  Yes [name], MD  promethazine (PHENERGAN) 12.5 MG tablet Take 1 tablet (12.5 mg total) every 6 (six) hours as needed by mouth for nausea or vomiting. 09/26/17  Yes [name], MD  loperamide (IMODIUM) 2 MG capsule Take 1 capsule (2 mg total) by mouth 3 (three) times daily as needed for diarrhea or loose stools. Patient not taking: Reported on 12/26/2017 09/09/17   Bonnielee Haff, MD    Family History Family History  Problem Relation Age of Onset  . Breast cancer Paternal Grandmother 10  . Colon cancer Paternal Grandmother 66  . Breast cancer Mother 64  . Prostate cancer Paternal Grandfather        dx in his 31s  . Leukemia Paternal Grandfather   . Lung cancer Maternal Uncle        heavy smoker  . Other Paternal Uncle        farm accident  . Stroke Maternal Grandfather     Social History Social History   Tobacco Use  . Smoking status: Never Smoker  . Smokeless tobacco: Former Network engineer Use Topics  . Alcohol use: Yes    Comment: once a year  . Drug use: No     Allergies   Patient has no known allergies.   Review of Systems Review of Systems All other systems negative except as documented in the HPI. All pertinent positives and negatives as reviewed in the HPI.   Physical Exam Updated Vital Signs BP 109/62   Pulse (!) 130   Temp (!) 102.9 F (39.4 C) (Oral)   Resp 16   Ht 5\' 6"  (1.676 m)   Wt 77.1 kg (170 lb)   LMP 08/27/2017 (Approximate) Comment: chemo  SpO2 100%   BMI 27.44 kg/m   Physical Exam  Constitutional: She is oriented to person, place, and time. She appears well-developed and well-nourished. She appears ill. No distress.  HENT:  Head: Normocephalic and atraumatic.  Mouth/Throat: Oropharynx is clear and moist.  Eyes: Pupils are equal, round, and reactive to light.  Neck: Normal range of motion. Neck  supple.  Cardiovascular: Normal rate, regular rhythm and normal heart sounds. Exam reveals no gallop and no friction rub.  No murmur heard. Pulmonary/Chest: Effort normal and breath sounds normal. No respiratory distress. She has no wheezes.  Abdominal: Soft. Bowel sounds are normal. She exhibits no distension. There is no tenderness.  Neurological: She is alert and oriented to person, place, and time. She exhibits normal muscle tone. Coordination normal. GCS eye subscore is 4. GCS verbal subscore is 5. GCS motor subscore is 6.  Skin: Skin is warm and dry. Capillary refill takes less than 2 seconds. No rash noted. No erythema.  Psychiatric:  She has a normal mood and affect. Her behavior is normal.  Nursing note and vitals reviewed.    ED Treatments / Results  Labs (all labs ordered are listed, but only abnormal results are displayed) Labs Reviewed  COMPREHENSIVE METABOLIC PANEL - Abnormal; Notable for the following components:      Result Value   Glucose, Bld 113 (*)    Creatinine, Ser 1.19 (*)    Calcium 8.6 (*)    Total Protein 6.1 (*)    Albumin 3.4 (*)    GFR calc non Af Amer 57 (*)    All other components within normal limits  CBC WITH DIFFERENTIAL/PLATELET - Abnormal; Notable for the following components:   RBC 2.70 (*)    Hemoglobin 8.6 (*)    HCT 26.5 (*)    RDW 18.0 (*)    Platelets 124 (*)    All other components within normal limits  URINALYSIS, ROUTINE W REFLEX MICROSCOPIC - Abnormal; Notable for the following components:   Color, Urine STRAW (*)    All other components within normal limits  I-STAT CG4 LACTIC ACID, ED - Abnormal; Notable for the following components:   Lactic Acid, Venous 0.37 (*)    All other components within normal limits  CULTURE, BLOOD (ROUTINE X 2)  CULTURE, BLOOD (ROUTINE X 2)  URINE CULTURE  INFLUENZA PANEL BY PCR (TYPE A & B)  LACTIC ACID, PLASMA  I-STAT BETA HCG BLOOD, ED (MC, [facility], AP ONLY)  I-STAT CG4 LACTIC ACID, ED    EKG  EKG  Interpretation None       Radiology Dg Chest 2 View  Result Date: 12/28/2017 CLINICAL DATA:  Cough and fever for 1 day. History of breast cancer. EXAM: CHEST  2 VIEW COMPARISON:  09/23/2017 chest radiograph and prior studies FINDINGS: The cardiomediastinal silhouette is unremarkable. A right Port-A-Cath is again identified with tip overlying the lower SVC. A 1.2 x 2 cm nodular opacity overlying the mid right lung is identified. The lungs are otherwise clear. There is no evidence of pleural effusion or pneumothorax. IMPRESSION: 1.2 x 2 cm nodular opacity overlying the mid right lung. Given this patient's history of breast cancer, chest CT with contrast is recommended. No evidence of focal airspace disease/pneumonia. Electronically Signed   By: Margarette Canada M.D.   On: 12/28/2017 11:25   Ct Chest W Contrast  Result Date: 12/28/2017 CLINICAL DATA:  42 year old female with cough and fever. Nodular opacity overlying the right mid lung on chest radiograph today. History of right breast cancer and bilateral mastectomies. EXAM: CT CHEST WITH CONTRAST TECHNIQUE: Multidetector CT imaging of the chest was performed during intravenous contrast administration. CONTRAST:  75 cc intravenous Isovue-300 COMPARISON:  12/28/2017 and prior chest radiographs. 07/02/2017 chest CT FINDINGS: Cardiovascular: Normal heart size. A right IJ central venous catheter is noted with tip at the superior cavoatrial junction. There is no evidence of thoracic aortic aneurysm. No pericardial effusion. Mediastinum/Nodes: No enlarged mediastinal, hilar, or axillary lymph nodes. Calcified right hilar lymph nodes are again noted. Thyroid gland, trachea, and esophagus demonstrate no significant findings. Lungs/Pleura: A 2.3 x 2.7 x 1.2 cm irregular masslike opacity within the right middle lobe is noted and contains 2 slightly coarse calcifications. This is suspicious for malignancy/metastasis but could represent unusual inflammatory or infectious  process. No other pulmonary nodules or masses are identified. No airspace disease, pleural effusion or pneumothorax noted. Upper Abdomen: No acute or significant abnormalities. Musculoskeletal: New sclerotic lesions are noted as follows and worrisome for  metastatic disease: A 3 mm right sternal/manubrial lesion (7:52) A 1 mm sternal lesion (7:71) A 3 mm lesion within the body of T4 (7:45) IMPRESSION: 1. 2.3 x 2.7 x 1.2 cm irregular masslike opacity within the right middle lobe worrisome for malignancy/metastasis. 2. 3 new very small sclerotic lesions, within the sternum and the body of T4, worrisome for new metastases. Electronically Signed   By: Margarette Canada M.D.   On: 12/28/2017 13:14    Procedures Procedures (including critical care time)  Medications Ordered in ED Medications  sodium chloride 0.9 % injection (not administered)  sodium chloride 0.9 % bolus 2,000 mL (2,000 mLs Intravenous New Bag/Given 12/28/17 1140)  iopamidol (ISOVUE-300) 61 % injection (75 mLs Intravenous Contrast Given 12/28/17 1241)     Initial Impression / Assessment and Plan / ED Course  I have reviewed the triage vital signs and the nursing notes.  Pertinent labs & imaging results that were available during my care of the patient were reviewed by me and considered in my medical decision making (see chart for details).     Patient is a cancer patient who recently received cancer related treatments with this fever and negative influenza swabbing I feel that she will need admission to further delineate the cause of the symptoms.  Concern is that she has some sort of underlying bacteremia she did have hypotension initially she was receiving fluids she did get IV antibiotics.  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  With these multiple factors and her recent cancer treatments I feel she needs to be admitted to the hospital.  I spoke with the  [facility] who will admit the patient for further evaluation Final Clinical Impressions(s) / ED Diagnoses   Final diagnoses:  None    ED Discharge Orders    None       Dalia Heading, PA-C 12/28/17 [place]    Jola Schmidt, MD 12/28/17 1616

## 2017-12-28 NOTE — ED Notes (Signed)
ED TO INPATIENT HANDOFF REPORT  Name/Age/Gender Sheila [name] 42 y.o. female  Code Status Code Status History    Date Active Date Inactive Code Status Order ID Comments User Context   09/04/2017 20:18 09/09/2017 21:29 Full Code [id number]  Bonnielee Haff, MD ED   07/22/2017 20:36 07/24/2017 16:02 Full Code [id number]  [name], MD Inpatient      Home/SNF/Other  Chief Complaint Blue Card; Fever; Cough  Level of Care/Admitting Diagnosis ED Disposition    ED Disposition Condition [facility] Area: [facility] [[id number]]  Level of Care: Telemetry [5]  Admit to tele based on following criteria: Other see comments  Comments: sepsis  Diagnosis: Sepsis [facility]) [[id number]]  Admitting Physician: RAIVernelle Emerald [[id number]]  Attending Physician: RAI, RIPUDEEP K [[id number]]  Estimated length of stay: past midnight tomorrow  Certification:: I certify this patient will need inpatient services for at least 2 midnights  PT Class (Do Not Modify): Inpatient [101]  PT Acc Code (Do Not Modify): Private [1]       Medical History Past Medical History:  Diagnosis Date  . Anxiety   . Cancer [facility])    right breast   . Family history of breast cancer   . Family history of colon cancer   . GERD (gastroesophageal reflux disease)   . Headache    couple of migraines in the past  . History of kidney stones   . Vitamin D deficiency     Allergies No Known Allergies  IV Location/Drains/Wounds Patient Lines/Drains/Airways Status   Active Line/Drains/Airways    Name:   Placement date:   Placement time:   Site:   Days:   Implanted Port 07/22/17 Right Chest   07/22/17    1812    Chest   159   Closed System Drain 1 Left Chest Bulb (JP) 19 Fr.   07/22/17    1646    Chest   159   Closed System Drain 2 Right;Proximal Chest Bulb (JP) 19 Fr.   07/22/17    1726    Chest   159   Closed System Drain 3 Right;Lateral Chest Bulb (JP) 19 Fr.   07/22/17    1727    Chest   159    Airway   07/22/17    1614     159   Incision (Closed) 07/22/17 Breast   07/22/17    1750     159          Labs/Imaging Results for orders placed or performed during the hospital encounter of 12/28/17 (from the past 48 hour(s))  Comprehensive metabolic panel     Status: Abnormal   Collection Time: 12/28/17 10:53 AM  Result Value Ref Range   Sodium 135 135 - 145 mmol/L   Potassium 3.9 3.5 - 5.1 mmol/L    Comment: DELTA CHECK NOTED   Chloride 104 101 - 111 mmol/L   CO2 24 22 - 32 mmol/L   Glucose, Bld 113 (H) 65 - 99 mg/dL   BUN 9 6 - 20 mg/dL   Creatinine, Ser 1.19 (H) 0.44 - 1.00 mg/dL   Calcium 8.6 (L) 8.9 - 10.3 mg/dL   Total Protein 6.1 (L) 6.5 - 8.1 g/dL   Albumin 3.4 (L) 3.5 - 5.0 g/dL   AST 22 15 - 41 U/L   ALT 17 14 - 54 U/L   Alkaline Phosphatase 59 38 - 126 U/L   Total Bilirubin 0.4 0.3 - 1.2 mg/dL  GFR calc non Af Amer 57 (L) >60 mL/min   GFR calc Af Amer >60 >60 mL/min    Comment: (NOTE) The eGFR has been calculated using the CKD EPI equation. This calculation has not been validated in all clinical situations. eGFR's persistently <60 mL/min signify possible Chronic Kidney Disease.    Anion gap 7 5 - 15    Comment: Performed at [facility], [place] [address] [place], [place] [address]  CBC with Differential     Status: Abnormal   Collection Time: 12/28/17 10:53 AM  Result Value Ref Range   WBC 6.6 4.0 - 10.5 K/uL   RBC 2.70 (L) 3.87 - 5.11 MIL/uL   Hemoglobin 8.6 (L) 12.0 - 15.0 g/dL    Comment: DELTA CHECK NOTED REPEATED TO VERIFY    HCT 26.5 (L) 36.0 - 46.0 %   MCV 98.1 78.0 - 100.0 fL   MCH 31.9 26.0 - 34.0 pg   MCHC 32.5 30.0 - 36.0 g/dL   RDW 18.0 (H) 11.5 - 15.5 %   Platelets 124 (L) 150 - 400 K/uL   Neutrophils Relative % 81 %   Lymphocytes Relative 10 %   Monocytes Relative 9 %   Eosinophils Relative 0 %   Basophils Relative 0 %   Neutro Abs 5.3 1.7 - 7.7 K/uL   Lymphs Abs 0.7 0.7 - 4.0 K/uL   Monocytes Absolute 0.6 0.1 -  1.0 K/uL   Eosinophils Absolute 0.0 0.0 - 0.7 K/uL   Basophils Absolute 0.0 0.0 - 0.1 K/uL   RBC Morphology POLYCHROMASIA PRESENT     Comment: Performed at [facility], [place] [address], [place], [place] [address]  I-Stat beta hCG blood, ED     Status: None   Collection Time: 12/28/17 11:42 AM  Result Value Ref Range   I-stat hCG, quantitative <5.0 <5 mIU/mL   Comment 3            Comment:   GEST. AGE      CONC.  (mIU/mL)   <=1 WEEK        5 - 50     2 WEEKS       50 - 500     3 WEEKS       100 - 10,000     4 WEEKS     1,000 - 30,000        FEMALE AND NON-PREGNANT FEMALE:     LESS THAN 5 mIU/mL   I-Stat CG4 Lactic Acid, ED     Status: Abnormal   Collection Time: 12/28/17 12:39 PM  Result Value Ref Range   Lactic Acid, Venous 0.37 (L) 0.5 - 1.9 mmol/L  Urinalysis, Routine w reflex microscopic     Status: Abnormal   Collection Time: 12/28/17  1:11 PM  Result Value Ref Range   Color, Urine STRAW (A) YELLOW   APPearance CLEAR CLEAR   Specific Gravity, Urine 1.011 1.005 - 1.030   pH 6.0 5.0 - 8.0   Glucose, UA NEGATIVE NEGATIVE mg/dL   Hgb urine dipstick NEGATIVE NEGATIVE   Bilirubin Urine NEGATIVE NEGATIVE   Ketones, ur NEGATIVE NEGATIVE mg/dL   Protein, ur NEGATIVE NEGATIVE mg/dL   Nitrite NEGATIVE NEGATIVE   Leukocytes, UA NEGATIVE NEGATIVE    Comment: Performed at [facility], [place] [address] [place],  [address]  Lactic acid, plasma     Status: Abnormal   Collection Time: 12/28/17  2:38 PM  Result Value Ref Range  Lactic Acid, Venous 2.2 (HH) 0.5 - 1.9 mmol/L    Comment: CRITICAL RESULT CALLED TO, READ BACK BY AND VERIFIED WITH: J.NJIHI,RN 12/28/17 '@1553'  BY V.WILKINS Performed at [facility], [place] [address] [place], [place] [address]    Dg Chest 2 View  Result Date: 12/28/2017 CLINICAL DATA:  Cough and fever for 1 day. History of breast cancer. EXAM: CHEST  2 VIEW COMPARISON:  09/23/2017 chest radiograph  and prior studies FINDINGS: The cardiomediastinal silhouette is unremarkable. A right Port-A-Cath is again identified with tip overlying the lower SVC. A 1.2 x 2 cm nodular opacity overlying the mid right lung is identified. The lungs are otherwise clear. There is no evidence of pleural effusion or pneumothorax. IMPRESSION: 1.2 x 2 cm nodular opacity overlying the mid right lung. Given this patient's history of breast cancer, chest CT with contrast is recommended. No evidence of focal airspace disease/pneumonia. Electronically Signed   By: Margarette Canada M.D.   On: 12/28/2017 11:25   Ct Chest W Contrast  Result Date: 12/28/2017 CLINICAL DATA:  42 year old female with cough and fever. Nodular opacity overlying the right mid lung on chest radiograph today. History of right breast cancer and bilateral mastectomies. EXAM: CT CHEST WITH CONTRAST TECHNIQUE: Multidetector CT imaging of the chest was performed during intravenous contrast administration. CONTRAST:  75 cc intravenous Isovue-300 COMPARISON:  12/28/2017 and prior chest radiographs. 07/02/2017 chest CT FINDINGS: Cardiovascular: Normal heart size. A right IJ central venous catheter is noted with tip at the superior cavoatrial junction. There is no evidence of thoracic aortic aneurysm. No pericardial effusion. Mediastinum/Nodes: No enlarged mediastinal, hilar, or axillary lymph nodes. Calcified right hilar lymph nodes are again noted. Thyroid gland, trachea, and esophagus demonstrate no significant findings. Lungs/Pleura: A 2.3 x 2.7 x 1.2 cm irregular masslike opacity within the right middle lobe is noted and contains 2 slightly coarse calcifications. This is suspicious for malignancy/metastasis but could represent unusual inflammatory or infectious process. No other pulmonary nodules or masses are identified. No airspace disease, pleural effusion or pneumothorax noted. Upper Abdomen: No acute or significant abnormalities. Musculoskeletal: New sclerotic lesions  are noted as follows and worrisome for metastatic disease: A 3 mm right sternal/manubrial lesion (7:52) A 1 mm sternal lesion (7:71) A 3 mm lesion within the body of T4 (7:45) IMPRESSION: 1. 2.3 x 2.7 x 1.2 cm irregular masslike opacity within the right middle lobe worrisome for malignancy/metastasis. 2. 3 new very small sclerotic lesions, within the sternum and the body of T4, worrisome for new metastases. Electronically Signed   By: Margarette Canada M.D.   On: 12/28/2017 13:14    Pending Labs Unresulted Labs (From admission, onward)   Start     Ordered   12/28/17 1613  C difficile quick scan w PCR reflex  (C Difficile quick screen w PCR reflex panel)  Once, for 48 hours,   R    Comments:  Laxatives (last 72 hours)   None     Question Answer Comment  Is your patient experiencing loose or watery stools (3 or more in 24 hours)? Yes   Has the patient received laxatives in the last 24 hours? No   Has a negative Cdiff test resulted in the last 7 days? No      12/28/17 1612   12/28/17 1421  Blood culture (routine x 2)  BLOOD CULTURE X 2,   STAT     12/28/17 1420   12/28/17 1421  Urine culture  STAT,   STAT  12/28/17 1420   12/28/17 1420  Influenza panel by PCR (type A & B)  (Influenza PCR Panel)  Once,   R     12/28/17 1419   Signed and Held  TSH  Once,   R     Signed and Held   Signed and Held  Basic metabolic panel  Tomorrow morning,   R     Signed and Held   Signed and Held  CBC  Tomorrow morning,   R     Signed and Held   Signed and Held  CBC  (heparin)  Once,   R    Comments:  Baseline for heparin therapy IF NOT ALREADY DRAWN.  Notify MD if PLT < 100 K.    Signed and Held   Signed and Held  Creatinine, serum  (heparin)  Once,   R    Comments:  Baseline for heparin therapy IF NOT ALREADY DRAWN.    Signed and Held      Vitals/Pain Today's Vitals   12/28/17 1500 12/28/17 1530 12/28/17 1545 12/28/17 1600  BP: 105/60 (!) 105/57  (!) 107/56  Pulse: (!) 132 (!) 133 (!) 130 (!) 132   Resp: '12 14 20 17  ' Temp:      TempSrc:      SpO2: 100% 99% 98% 98%  Weight:      Height:      PainSc:        Isolation Precautions Enteric precautions (UV disinfection)  Medications Medications  sodium chloride 0.9 % injection (not administered)  vancomycin (VANCOCIN) IVPB 1000 mg/200 mL premix (not administered)  piperacillin-tazobactam (ZOSYN) IVPB 3.375 g (not administered)  0.9 %  sodium chloride infusion (not administered)  oseltamivir (TAMIFLU) capsule 75 mg (not administered)  sodium chloride 0.9 % bolus 2,000 mL (0 mLs Intravenous Stopped 12/28/17 1611)  iopamidol (ISOVUE-300) 61 % injection (75 mLs Intravenous Contrast Given 12/28/17 1241)    Mobility

## 2017-12-28 NOTE — ED Triage Notes (Signed)
Pt developed fever, headache and cough last night, Husband hospitalized with H1N1. Last chemo 2 weeks ago

## 2017-12-28 NOTE — Progress Notes (Addendum)
Pharmacy Antibiotic Note  Sheila [name] is a 42 y.o. female admitted on 12/28/2017 with sepsis possibly due to influenza, gastroenteritis or chemo-related enteritis. Vancomycin 1gm IV and Zosyn 3.375gm IV x 1 dose ordered in ED.  Upon admission,  Pharmacy has been consulted for Zosyn dosing.  Plan: Zosyn 3.375g IV q8h (4 hour infusion).  Follow cultures/sensitivities  Height: 5\' 6"  (167.6 cm) Weight: 173 lb 12.8 oz (78.8 kg) IBW/kg (Calculated) : 59.3  Temp (24hrs), Avg:102.8 F (39.3 C), Min:102.7 F (39.3 C), Max:102.9 F (39.4 C)  Recent Labs  Lab 12/26/17 2114 12/26/17 2141 12/28/17 1053 12/28/17 1239 12/28/17 1438  WBC 4.8  --  6.6  --   --   CREATININE 0.94  --  1.19*  --   --   LATICACIDVEN  --  1.04  --  0.37* 2.2*    Estimated Creatinine Clearance: 67.2 mL/min (A) (by C-G formula based on SCr of 1.19 mg/dL (H)).    No Known Allergies  Antimicrobials this admission: 2/10 Zosyn >>    Dose adjustments this admission:    Microbiology results: 2/10 BCx: sent 2/10 UCx: sent  2/10 Influenza panel: + Influenza A 2/10 Resp Panel: 2/10 GI Panel:  Thank you for allowing pharmacy to be a part of this patient's care.  Everette Rank, PharmD 12/28/2017 5:29 PM

## 2017-12-28 NOTE — H&P (Signed)
History and Physical        Hospital Admission Note Date: 12/28/2017  Patient name: Sheila [name] Medical record number: [id number] Date of birth: [birth year] Age: 42 y.o. Gender: female  PCP: [name], MD    Patient coming from: home   I have reviewed all records in the [facility].    Chief Complaint:  Nausea, diarrhea, cough, fevers, sore throat  HPI: Patient is a 42 year old female with history of right breast CA (oncologist Dr. Burr Medico) ER, PR positive, HER2 positive, stage Ib status post bilateral mastectomies on adjuvant chemotherapy with TCHP last 2 weeks ago, sinus tachycardia (evaluated by Dr. Aundra Dubin) presented to ED with fevers, headache, coughing that started last night.  Patient reported that her husband was hospitalized 3 days ago at [facility] with H1 N1 flu and was released today.  Patient started having similar symptoms as her husband with fevers 102.9 F, headaches, productive cough, generalized aches.  She also reported nausea, vomiting and diarrhea for last 6 days.  Patient reports watery bowel movement every day and has been taking Lomotil.  Denied any abdominal pain, hematochezia or melena.   ED work-up/course:  Temp 102.9 F, heart rate 128-132, BP 102/68, respiratory rate 16 BMET unremarkable, potassium 3.9.  Hemoglobin 8.6, hematocrit 26.5 WBC 6.6.  Lactic acid 2.2. CT chest showed 2.3x 2.7x 1.2 cm irregular masslike opacity within the right middle lobe worrisome for malignancy/metastasis.  3 new very small sclerotic lesions within the sternum and the body of T4 worrisome for new metastasis.   Review of Systems: Positives marked in 'bold' Constitutional: + fever, chills, diaphoresis, poor appetite and fatigue.  HEENT: Denies photophobia, eye pain, redness, hearing loss, ear pain, congestion,   c/o Sore throat, rhinorrhea, denies  sneezing,  mouth sores, trouble swallowing, neck pain, neck stiffness and tinnitus.  Respiratory: Denies chest tightness,  and wheezing.  Coughing Cardiovascular: Denies chest pain, palpitations and leg swelling.  Gastrointestinal: Please see HPI  genitourinary: Denies dysuria, urgency, frequency, hematuria, flank pain and difficulty urinating.  Musculoskeletal: Denies myalgias, back pain, joint swelling, arthralgias and gait problem.  Skin: Denies pallor, rash and wound.  Neurological: Feels generalized weakness, no dizziness, seizures, syncope, light-headedness, numbness and headaches.  Hematological: Denies adenopathy. Easy bruising, personal or family bleeding history  Psychiatric/Behavioral: Denies suicidal ideation, mood changes, confusion, nervousness, sleep disturbance and agitation  Past Medical History: Past Medical History:  Diagnosis Date  . Anxiety   . Cancer [facility])    right breast   . Family history of breast cancer   . Family history of colon cancer   . GERD (gastroesophageal reflux disease)   . Headache    couple of migraines in the past  . History of kidney stones   . Vitamin D deficiency     Past Surgical History:  Procedure Laterality Date  . egg donation    . kidney stone removal    . MASTECTOMY W/ SENTINEL NODE BIOPSY Bilateral 07/22/2017   Procedure: RIGHT TOTAL MASTECTOMY WITH RIGHT AXILLARY SENTINEL LYMPH NODE BIOPSY, LEFT PROPHYLACTIC MASTECTOMY;  Surgeon: [name], MD;  Location: [place];  Service: General;  Laterality: Bilateral;  . PORTACATH PLACEMENT Right 07/22/2017  Procedure: INSERTION PORT-A-CATH WITH [name];  Surgeon: [name], MD;  Location: [place];  Service: General;  Laterality: Right;    Medications: Prior to Admission medications   Medication Sig Start Date End Date Taking? Authorizing Provider  carvedilol (COREG) 3.125 MG tablet Take 1 tablet (3.125 mg total) by mouth 2 (two) times daily. 11/17/17 02/15/18 Yes Larey Dresser, MD    Cholecalciferol (VITAMIN D) 2000 units tablet Take 2,000 Units by mouth daily.   Yes [provider]  dexamethasone (DECADRON) 4 MG tablet Take 2 tablets (8 mg total) by mouth 2 (two) times daily. Start the day before Taxotere. Then again the day after chemo for 3 days. 08/05/17  Yes [name], MD  diphenoxylate-atropine (LOMOTIL) 2.5-0.025 MG tablet Take 1-2 tablets by mouth 4 (four) times daily as needed for diarrhea or loose stools. 09/19/17  Yes [name], MD  fluticasone (FLONASE) 50 MCG/ACT nasal spray Place 1 spray into both nostrils daily.   Yes [provider]  lidocaine-prilocaine (EMLA) cream Apply to affected area once Patient taking differently: Apply 1 application topically once as needed (port access). Apply to affected area once 08/05/17  Yes [name], MD  LORazepam (ATIVAN) 0.5 MG tablet Take 1 tablet (0.5 mg total) by mouth every 8 (eight) hours as needed for anxiety or sleep (or nausea). Patient taking differently: Take 0.5 mg by mouth daily as needed for anxiety or sleep (or nausea).  12/12/17  Yes [name], MD  mirtazapine (REMERON) 30 MG tablet Take 30 mg by mouth at bedtime.   Yes [provider]  naproxen sodium (ANAPROX) 220 MG tablet Take 220 mg 2 (two) times daily as needed by mouth (pain).    Yes [provider]  prochlorperazine (COMPAZINE) 10 MG tablet Take 1 tablet (10 mg total) by mouth every 6 (six) hours as needed (Nausea or vomiting). 12/12/17  Yes [name], MD  promethazine (PHENERGAN) 12.5 MG tablet Take 1 tablet (12.5 mg total) every 6 (six) hours as needed by mouth for nausea or vomiting. 09/26/17  Yes [name], MD  loperamide (IMODIUM) 2 MG capsule Take 1 capsule (2 mg total) by mouth 3 (three) times daily as needed for diarrhea or loose stools. Patient not taking: Reported on 12/26/2017 09/09/17   Bonnielee Haff, MD    Allergies:  No Known Allergies  Social History:  reports that  has never smoked. She has quit using  smokeless tobacco. She reports that she drinks alcohol. She reports that she does not use drugs.  Family History: Family History  Problem Relation Age of Onset  . Breast cancer Paternal Grandmother 3  . Colon cancer Paternal Grandmother 93  . Breast cancer Mother 4  . Prostate cancer Paternal Grandfather        dx in his 34s  . Leukemia Paternal Grandfather   . Lung cancer Maternal Uncle        heavy smoker  . Other Paternal Uncle        farm accident  . Stroke Maternal Grandfather     Physical Exam: Blood pressure (!) 107/56, pulse (!) 132, temperature (!) 102.9 F (39.4 C), temperature source Oral, resp. rate 17, height '5\' 6"'  (1.676 m), weight 77.1 kg (170 lb), last menstrual period 08/27/2017, SpO2 98 %. General: Alert, awake, oriented x3, in no acute distress. Eyes: pink conjunctiva,anicteric sclera, pupils equal and reactive to light and accomodation, HEENT: normocephalic, atraumatic, oropharynx clear Neck: supple, no masses or lymphadenopathy, no goiter, no bruits, no  JVD CVS: Regular rate and rhythm, without murmurs, rubs or gallops. No lower extremity edema Resp : Clear to auscultation bilaterally, no wheezing, rales or rhonchi. GI : Soft, nontender, nondistended, positive bowel sounds, no masses. No hepatomegaly. No hernia.  Musculoskeletal: No clubbing or cyanosis, positive pedal pulses. No contracture. ROM intact  Neuro: Grossly intact, no focal neurological deficits, strength 5/5 upper and lower extremities bilaterally Psych: alert and oriented x 3, normal mood and affect Skin: no rashes or lesions, warm and dry   LABS on Admission: I have personally reviewed all the labs and imagings below    Basic Metabolic Panel: Recent Labs  Lab 12/26/17 2114 12/28/17 1053  NA 140 135  K 3.2* 3.9  CL 105 104  CO2 25 24  GLUCOSE 104* 113*  BUN 5* 9  CREATININE 0.94 1.19*  CALCIUM 8.9 8.6*  MG 1.9  --    Liver Function Tests: Recent Labs  Lab 12/26/17 2114  12/28/17 1053  AST 23 22  ALT 21 17  ALKPHOS 80 59  BILITOT 0.2* 0.4  PROT 6.5 6.1*  ALBUMIN 3.7 3.4*   Recent Labs  Lab 12/26/17 2114  LIPASE 31   No results for input(s): AMMONIA in the last 168 hours. CBC: Recent Labs  Lab 12/26/17 2114 12/28/17 1053  WBC 4.8 6.6  NEUTROABS  --  5.3  HGB 13.7 8.6*  HCT 42.9 26.5*  MCV 97.7 98.1  PLT 100* 124*   Cardiac Enzymes: No results for input(s): CKTOTAL, CKMB, CKMBINDEX, TROPONINI in the last 168 hours. BNP: Invalid input(s): POCBNP CBG: No results for input(s): GLUCAP in the last 168 hours.  Radiological Exams on Admission:  Dg Chest 2 View  Result Date: 12/28/2017 CLINICAL DATA:  Cough and fever for 1 day. History of breast cancer. EXAM: CHEST  2 VIEW COMPARISON:  09/23/2017 chest radiograph and prior studies FINDINGS: The cardiomediastinal silhouette is unremarkable. A right Port-A-Cath is again identified with tip overlying the lower SVC. A 1.2 x 2 cm nodular opacity overlying the mid right lung is identified. The lungs are otherwise clear. There is no evidence of pleural effusion or pneumothorax. IMPRESSION: 1.2 x 2 cm nodular opacity overlying the mid right lung. Given this patient's history of breast cancer, chest CT with contrast is recommended. No evidence of focal airspace disease/pneumonia. Electronically Signed   By: Margarette Canada M.D.   On: 12/28/2017 11:25   Ct Chest W Contrast  Result Date: 12/28/2017 CLINICAL DATA:  43 year old female with cough and fever. Nodular opacity overlying the right mid lung on chest radiograph today. History of right breast cancer and bilateral mastectomies. EXAM: CT CHEST WITH CONTRAST TECHNIQUE: Multidetector CT imaging of the chest was performed during intravenous contrast administration. CONTRAST:  75 cc intravenous Isovue-300 COMPARISON:  12/28/2017 and prior chest radiographs. 07/02/2017 chest CT FINDINGS: Cardiovascular: Normal heart size. A right IJ central venous catheter is noted  with tip at the superior cavoatrial junction. There is no evidence of thoracic aortic aneurysm. No pericardial effusion. Mediastinum/Nodes: No enlarged mediastinal, hilar, or axillary lymph nodes. Calcified right hilar lymph nodes are again noted. Thyroid gland, trachea, and esophagus demonstrate no significant findings. Lungs/Pleura: A 2.3 x 2.7 x 1.2 cm irregular masslike opacity within the right middle lobe is noted and contains 2 slightly coarse calcifications. This is suspicious for malignancy/metastasis but could represent unusual inflammatory or infectious process. No other pulmonary nodules or masses are identified. No airspace disease, pleural effusion or pneumothorax noted. Upper Abdomen: No acute or  significant abnormalities. Musculoskeletal: New sclerotic lesions are noted as follows and worrisome for metastatic disease: A 3 mm right sternal/manubrial lesion (7:52) A 1 mm sternal lesion (7:71) A 3 mm lesion within the body of T4 (7:45) IMPRESSION: 1. 2.3 x 2.7 x 1.2 cm irregular masslike opacity within the right middle lobe worrisome for malignancy/metastasis. 2. 3 new very small sclerotic lesions, within the sternum and the body of T4, worrisome for new metastases. Electronically Signed   By: Margarette Canada M.D.   On: 12/28/2017 13:14      EKG: From 2/ 8 Sinus tachycardia, rate 100   Assessment/Plan Principal Problem:   Sepsis ([place]) -Patient met sepsis criteria at the time of admission with fever, tachycardia, lactic acidosis, dehydration, possibly due to influenza versus colitis.  Patient's husband with confirmed flu and currently on Tamiflu -Placed on aggressive IV fluid hydration, obtain blood cultures, pro-calcitonin, influenza panel, respiratory virus panel.  C. difficile PCR given patient is immunocompromised and on chemotherapy with diarrhea for last 6 days. -Started empirically on Tamiflu, follow influenza PCR -Obtain CT abdomen pelvis to rule out colitis versus metastasis, given the  new findings on CT chest.   Active Problems: Nausea, vomiting, diarrhea -Possibly due to influenza, gastroenteritis versus chemotherapy related enteritis -Placed on aggressive IV fluid hydration, continue Compazine, Zofran IV as needed, obtain CT abdomen pelvis for further workup -Continue IV Zosyn started in ED for now.    Lactic acidosis, dehydration -Placed on aggressive IV fluid hydration    Lung mass, new under the setting of known history of breast cancer -CT chest showed 2.3x 2.7x 1.2 cm irregular mass like opacity in the right middle lobe worrisome for malignancy/metastasis, 3 new small sclerotic lesions in the sternum and body of T4 -Oncology will be consulted, patient known to Dr. Burr Medico, who is added as consultant -Obtain CT abdomen pelvis    Sinus tachycardia -Likely worsened due to #1, she was started on Coreg -2D echo 2/1 showed EF of 60%, no regional wall motion abnormalities -Obtain TSH, follows Dr. Aundra Dubin   DVT prophylaxis: heparin sq  CODE STATUS: full code, addressed with the patient  Consults called: Oncology  Family Communication: Admission, patients condition and plan of care including tests being ordered have been discussed with the patient and sister-in-law who indicates understanding and agree with the plan and Code Status  Admission status: Inpatient, telemetry  Disposition plan: Further plan will depend as patient's clinical course evolves and further radiologic and laboratory data become available.    At the time of admission, it appears that the appropriate admission status for this patient is INPATIENT . This is judged to be reasonable and necessary in order to provide the required intensity of service to ensure the patient's safety given the presenting symptoms nausea, fever, diarrhea, dehydration, lactic acidosis, sepsis, physical exam findings, and initial radiographic data with new metastatic lesions in chest and T4 and laboratory data in the  context of their chronic comorbidities.  The medical decision making on this patient was of high complexity and the patient is at high risk for clinical deterioration, therefore this is a level 3 visit.   Time Spent on Admission: 34mns     King Pinzon M.D. [facility] 12/28/2017, 4:28 PM Pager: [phone] If 7PM-7AM, please contact night-coverage [email or web address] Password [email or web address]

## 2017-12-29 ENCOUNTER — Inpatient Hospital Stay (HOSPITAL_COMMUNITY): Payer: PRIVATE HEALTH INSURANCE

## 2017-12-29 DIAGNOSIS — K521 Toxic gastroenteritis and colitis: Secondary | ICD-10-CM

## 2017-12-29 DIAGNOSIS — D6481 Anemia due to antineoplastic chemotherapy: Secondary | ICD-10-CM

## 2017-12-29 DIAGNOSIS — J101 Influenza due to other identified influenza virus with other respiratory manifestations: Secondary | ICD-10-CM

## 2017-12-29 DIAGNOSIS — D6959 Other secondary thrombocytopenia: Secondary | ICD-10-CM

## 2017-12-29 LAB — GASTROINTESTINAL PANEL BY PCR, STOOL (REPLACES STOOL CULTURE)
ADENOVIRUS F40/41: NOT DETECTED
Astrovirus: NOT DETECTED
CYCLOSPORA CAYETANENSIS: NOT DETECTED
Campylobacter species: NOT DETECTED
Cryptosporidium: NOT DETECTED
E. coli O157: NOT DETECTED
ENTAMOEBA HISTOLYTICA: NOT DETECTED
ENTEROPATHOGENIC E COLI (EPEC): NOT DETECTED
ENTEROTOXIGENIC E COLI (ETEC): NOT DETECTED
Enteroaggregative E coli (EAEC): NOT DETECTED
Giardia lamblia: NOT DETECTED
NOROVIRUS GI/GII: NOT DETECTED
Plesimonas shigelloides: NOT DETECTED
Rotavirus A: NOT DETECTED
SHIGA LIKE TOXIN PRODUCING E COLI (STEC): NOT DETECTED
Salmonella species: NOT DETECTED
Sapovirus (I, II, IV, and V): NOT DETECTED
Shigella/Enteroinvasive E coli (EIEC): NOT DETECTED
VIBRIO CHOLERAE: NOT DETECTED
VIBRIO SPECIES: NOT DETECTED
Yersinia enterocolitica: NOT DETECTED

## 2017-12-29 LAB — LACTIC ACID, PLASMA: Lactic Acid, Venous: 0.9 mmol/L (ref 0.5–1.9)

## 2017-12-29 LAB — RESPIRATORY PANEL BY PCR
Adenovirus: NOT DETECTED
BORDETELLA PERTUSSIS-RVPCR: NOT DETECTED
CORONAVIRUS 229E-RVPPCR: NOT DETECTED
CORONAVIRUS NL63-RVPPCR: NOT DETECTED
CORONAVIRUS OC43-RVPPCR: NOT DETECTED
Chlamydophila pneumoniae: NOT DETECTED
Coronavirus HKU1: NOT DETECTED
INFLUENZA A H1 2009-RVPPR: DETECTED — AB
INFLUENZA A H1-RVPPCR: NOT DETECTED
Influenza A H3: NOT DETECTED
Influenza A: NOT DETECTED
Influenza B: NOT DETECTED
Metapneumovirus: NOT DETECTED
Mycoplasma pneumoniae: NOT DETECTED
PARAINFLUENZA VIRUS 1-RVPPCR: NOT DETECTED
PARAINFLUENZA VIRUS 2-RVPPCR: NOT DETECTED
Parainfluenza Virus 3: NOT DETECTED
Parainfluenza Virus 4: NOT DETECTED
RHINOVIRUS / ENTEROVIRUS - RVPPCR: NOT DETECTED
Respiratory Syncytial Virus: NOT DETECTED

## 2017-12-29 LAB — CBC
HCT: 25.2 % — ABNORMAL LOW (ref 36.0–46.0)
Hemoglobin: 7.9 g/dL — ABNORMAL LOW (ref 12.0–15.0)
MCH: 31.2 pg (ref 26.0–34.0)
MCHC: 31.3 g/dL (ref 30.0–36.0)
MCV: 99.6 fL (ref 78.0–100.0)
PLATELETS: 107 10*3/uL — AB (ref 150–400)
RBC: 2.53 MIL/uL — ABNORMAL LOW (ref 3.87–5.11)
RDW: 18.3 % — AB (ref 11.5–15.5)
WBC: 5 10*3/uL (ref 4.0–10.5)

## 2017-12-29 LAB — BASIC METABOLIC PANEL
Anion gap: 9 (ref 5–15)
BUN: 6 mg/dL (ref 6–20)
CALCIUM: 8.1 mg/dL — AB (ref 8.9–10.3)
CO2: 23 mmol/L (ref 22–32)
CREATININE: 0.96 mg/dL (ref 0.44–1.00)
Chloride: 111 mmol/L (ref 101–111)
GFR calc Af Amer: >60 mL/min (ref >60)
GFR calc non Af Amer: >60 mL/min (ref >60)
GLUCOSE: 101 mg/dL — AB (ref 65–99)
Potassium: 3.5 mmol/L (ref 3.5–5.1)
SODIUM: 143 mmol/L (ref 135–145)

## 2017-12-29 LAB — C DIFFICILE QUICK SCREEN W PCR REFLEX
C DIFFICILE (CDIFF) TOXIN: NEGATIVE
C DIFFICLE (CDIFF) ANTIGEN: NEGATIVE
C Diff interpretation: NOT DETECTED

## 2017-12-29 MED ORDER — PHENOL 1.4 % MT LIQD
1.0000 | OROMUCOSAL | Status: DC | PRN
  Administered 2017-12-29: 1 via OROMUCOSAL
  Filled 2017-12-29: qty 177

## 2017-12-29 MED ORDER — IOPAMIDOL (ISOVUE-300) INJECTION 61%: 15.0000 mL | Freq: Once | INTRAVENOUS | Status: DC | PRN

## 2017-12-29 MED ORDER — MENTHOL 3 MG MT LOZG
1.0000 | LOZENGE | OROMUCOSAL | Status: DC | PRN
  Administered 2018-01-01: 3 mg via ORAL
  Filled 2017-12-29 (×4): qty 9

## 2017-12-29 MED ORDER — IOPAMIDOL (ISOVUE-300) INJECTION 61%
INTRAVENOUS | Status: AC
  Filled 2017-12-29: qty 100

## 2017-12-29 MED ORDER — ACETAMINOPHEN 325 MG PO TABS
650.0000 mg | ORAL_TABLET | Freq: Once | ORAL | Status: AC
  Administered 2017-12-29: 650 mg via ORAL
  Filled 2017-12-29: qty 2

## 2017-12-29 MED ORDER — IOPAMIDOL (ISOVUE-300) INJECTION 61%
100.0000 mL | Freq: Once | INTRAVENOUS | Status: AC | PRN
  Administered 2017-12-29: 100 mL via INTRAVENOUS

## 2017-12-29 MED ORDER — DIPHENOXYLATE-ATROPINE 2.5-0.025 MG PO TABS
2.0000 | ORAL_TABLET | Freq: Once | ORAL | Status: AC
  Administered 2017-12-29: 2 via ORAL
  Filled 2017-12-29: qty 2

## 2017-12-29 MED ORDER — DIPHENOXYLATE-ATROPINE 2.5-0.025 MG PO TABS
1.0000 | ORAL_TABLET | Freq: Four times a day (QID) | ORAL | Status: DC | PRN
  Administered 2017-12-30 – 2018-01-05 (×11): 1 via ORAL
  Filled 2017-12-29 (×11): qty 1

## 2017-12-29 MED ORDER — IOPAMIDOL (ISOVUE-300) INJECTION 61%
INTRAVENOUS | Status: AC
  Filled 2017-12-29: qty 30

## 2017-12-29 MED ORDER — SODIUM CHLORIDE 0.9 % IJ SOLN
INTRAMUSCULAR | Status: AC
  Filled 2017-12-29: qty 50

## 2017-12-29 NOTE — Progress Notes (Signed)
Dr Tana Coast returned page and requests pt receive another dose of Tylenol now. Order entered and administered. Oncoming RN made aware to recheck temp.

## 2017-12-29 NOTE — Progress Notes (Signed)
Radiologist tech notified RN that CT abdomen/pelvis will likely be performed 2/11 around 1 pm due to contrast that was received during CT chest and timing scans with contrast 24 hours apart. Will continue to monitor pt closely. Carnella [place] I

## 2017-12-29 NOTE — Progress Notes (Addendum)
Pt temp at 1620> 103.3 orally.  PRN dose Tylenol given.  Temp recheck >103.1 orally.  Dr Tana Coast paged to make aware. Will monitor for any new orders.

## 2017-12-29 NOTE — Progress Notes (Signed)
Sheila [name]   NMM:[birth year]   JS#:[id number]   YTW#:[id number]  ONCOLOGY FOLLOW UP   Subjective: Patient is well-known to me, under my care for her recently diagnosed breast cancer.  She has just completed her last cycle of adjuvant chemotherapy ON 12/12/2017.  A few days ago for fever, sore throat, fatigue and diarrhea.  She was found to have influenza.    Objective:  Vitals:   12/29/17 2049 12/29/17 2143  BP: (!) 96/55   Pulse: (!) 112   Resp: 18   Temp: (!) 102.1 F (38.9 C) 100.1 F (37.8 C)  SpO2: 96%     Body mass index is 28.05 kg/m.  Intake/Output Summary (Last 24 hours) at 12/29/2017 2322 Last data filed at 12/29/2017 1828 Gross per 24 hour  Intake 2960 ml  Output 800 ml  Net 2160 ml     Sclerae unicteric  Lungs clear -- no rales or rhonchi  Heart regular rate and rhythm  Abdomen benign  MSK no focal spinal tenderness, no peripheral edema  Neuro nonfocal   CBG (last 3)  No results for input(s): GLUCAP in the last 72 hours.   Labs:  Lab Results  Component Value Date   WBC 5.0 12/29/2017   HGB 7.9 (L) 12/29/2017   HCT 25.2 (L) 12/29/2017   MCV 99.6 12/29/2017   PLT 107 (L) 12/29/2017   NEUTROABS 5.3 12/28/2017   CMP Latest Ref Rng & Units 12/29/2017 12/28/2017 12/26/2017  Glucose 65 - 99 mg/dL 101(H) 113(H) 104(H)  BUN 6 - 20 mg/dL 6 9 5(L)  Creatinine 0.44 - 1.00 mg/dL 0.96 1.19(H) 0.94  Sodium 135 - 145 mmol/L 143 135 140  Potassium 3.5 - 5.1 mmol/L 3.5 3.9 3.2(L)  Chloride 101 - 111 mmol/L 111 104 105  CO2 22 - 32 mmol/L _0 Calcium 8.9 - 10.3 mg/dL 8.1(L) 8.6(L) 8.9  Total Protein 6.5 - 8.1 g/dL - 6.1(L) 6.5  Total Bilirubin 0.3 - 1.2 mg/dL - 0.4 0.2(L)  Alkaline Phos 38 - 126 U/L - 59 80  AST 15 - 41 U/L - 22 23  ALT 14 - 54 U/L - 17 21     Urine Studies No results for input(s): UHGB, CRYS in the last 72 hours.  Invalid input(s): UACOL, UAPR, USPG, UPH, UTP, UGL, UKET, UBIL, UNIT, UROB, ULEU, UEPI, UWBC, URBC, UBAC, CAST, [place],  [place]  Basic Metabolic Panel: Recent Labs  Lab 12/26/17 2114 12/28/17 1053 12/29/17 0350  NA 140 135 143  K 3.2* 3.9 3.5  CL 105 104 111  CO2 _1 GLUCOSE 104* 113* 101*  BUN 5* 9 6  CREATININE 0.94 1.19* 0.96  CALCIUM 8.9 8.6* 8.1*  MG 1.9  --   --    GFR Estimated Creatinine Clearance: 83.3 mL/min (by C-G formula based on SCr of 0.96 mg/dL). Liver Function Tests: Recent Labs  Lab 12/26/17 2114 12/28/17 1053  AST 23 22  ALT 21 17  ALKPHOS 80 59  BILITOT 0.2* 0.4  PROT 6.5 6.1*  ALBUMIN 3.7 3.4*   Recent Labs  Lab 12/26/17 2114  LIPASE 31   No results for input(s): AMMONIA in the last 168 hours. Coagulation profile No results for input(s): INR, PROTIME in the last 168 hours.  CBC: Recent Labs  Lab 12/26/17 2114 12/28/17 1053 12/29/17 0350  WBC 4.8 6.6 5.0  NEUTROABS  --  5.3  --   HGB 13.7 8.6* 7.9*  HCT 42.9 26.5* 25.2*  MCV 97.7  98.1 99.6  PLT 100* 124* 107*   Cardiac Enzymes: No results for input(s): CKTOTAL, CKMB, CKMBINDEX, TROPONINI in the last 168 hours. BNP: Invalid input(s): POCBNP CBG: No results for input(s): GLUCAP in the last 168 hours. D-Dimer No results for input(s): DDIMER in the last 72 hours. Hgb A1c No results for input(s): HGBA1C in the last 72 hours. Lipid Profile No results for input(s): CHOL, HDL, LDLCALC, TRIG, CHOLHDL, LDLDIRECT in the last 72 hours. Thyroid function studies Recent Labs    12/28/17 1830  TSH 0.692   Anemia work up No results for input(s): VITAMINB12, FOLATE, FERRITIN, TIBC, IRON, RETICCTPCT in the last 72 hours. Microbiology Recent Results (from the past 240 hour(s))  Blood culture (routine x 2)     Status: None (Preliminary result)   Collection Time: 12/28/17  2:38 PM  Result Value Ref Range Status   Specimen Description   Final    BLOOD PORTA CATH Performed at [facility], [address] [place], [place] [address]    Special Requests   Final    BOTTLES DRAWN AEROBIC AND  ANAEROBIC Blood Culture adequate volume Performed at [facility] [address], [place], [place] [address]    Culture PENDING  Incomplete   Report Status PENDING  Incomplete  Blood culture (routine x 2)     Status: None (Preliminary result)   Collection Time: 12/28/17  2:38 PM  Result Value Ref Range Status   Specimen Description   Final    BLOOD LEFT ANTECUBITAL Performed at [facility], [place] [address], [place], [place] [address]    Special Requests   Final    BOTTLES DRAWN AEROBIC AND ANAEROBIC Blood Culture adequate volume Performed at [facility] [address] [place], [place] [address]    Culture PENDING  Incomplete   Report Status PENDING  Incomplete  Respiratory Panel by PCR     Status: Abnormal   Collection Time: 12/28/17  4:32 PM  Result Value Ref Range Status   Adenovirus NOT DETECTED NOT DETECTED Final   Coronavirus 229E NOT DETECTED NOT DETECTED Final   Coronavirus HKU1 NOT DETECTED NOT DETECTED Final   Coronavirus NL63 NOT DETECTED NOT DETECTED Final   Coronavirus OC43 NOT DETECTED NOT DETECTED Final   Metapneumovirus NOT DETECTED NOT DETECTED Final   Rhinovirus / Enterovirus NOT DETECTED NOT DETECTED Final   Influenza A NOT DETECTED NOT DETECTED Final   Influenza A H1 NOT DETECTED NOT DETECTED Final   Influenza A H1 2009 DETECTED (A) NOT DETECTED Final   Influenza A H3 NOT DETECTED NOT DETECTED Final   Influenza B NOT DETECTED NOT DETECTED Final   Parainfluenza Virus 1 NOT DETECTED NOT DETECTED Final   Parainfluenza Virus 2 NOT DETECTED NOT DETECTED Final   Parainfluenza Virus 3 NOT DETECTED NOT DETECTED Final   Parainfluenza Virus 4 NOT DETECTED NOT DETECTED Final   Respiratory Syncytial Virus NOT DETECTED NOT DETECTED Final   Bordetella pertussis NOT DETECTED NOT DETECTED Final   Chlamydophila pneumoniae NOT DETECTED NOT DETECTED Final   Mycoplasma pneumoniae NOT DETECTED NOT DETECTED Final    Comment:  Performed at [facility], [place] [address], [place], [place] [address]  C difficile quick scan w PCR reflex     Status: None   Collection Time: 12/29/17  5:38 AM  Result Value Ref Range Status   C Diff antigen NEGATIVE NEGATIVE Final   C Diff toxin NEGATIVE NEGATIVE Final  C Diff interpretation No C. difficile detected.  Final    Comment: Performed at [facility], [place] [address] [place], [place] [address]  Gastrointestinal Panel by PCR , Stool     Status: None   Collection Time: 12/29/17  5:38 AM  Result Value Ref Range Status   Campylobacter species NOT DETECTED NOT DETECTED Final   Plesimonas shigelloides NOT DETECTED NOT DETECTED Final   Salmonella species NOT DETECTED NOT DETECTED Final   Yersinia enterocolitica NOT DETECTED NOT DETECTED Final   Vibrio species NOT DETECTED NOT DETECTED Final   Vibrio cholerae NOT DETECTED NOT DETECTED Final   Enteroaggregative E coli (EAEC) NOT DETECTED NOT DETECTED Final   Enteropathogenic E coli (EPEC) NOT DETECTED NOT DETECTED Final   Enterotoxigenic E coli (ETEC) NOT DETECTED NOT DETECTED Final   Shiga like toxin producing E coli (STEC) NOT DETECTED NOT DETECTED Final   E. coli O157 NOT DETECTED NOT DETECTED Final   Shigella/Enteroinvasive E coli (EIEC) NOT DETECTED NOT DETECTED Final   Cryptosporidium NOT DETECTED NOT DETECTED Final   Cyclospora cayetanensis NOT DETECTED NOT DETECTED Final   Entamoeba histolytica NOT DETECTED NOT DETECTED Final   Giardia lamblia NOT DETECTED NOT DETECTED Final   Adenovirus F40/41 NOT DETECTED NOT DETECTED Final   Astrovirus NOT DETECTED NOT DETECTED Final   Norovirus GI/GII NOT DETECTED NOT DETECTED Final   Rotavirus A NOT DETECTED NOT DETECTED Final   Sapovirus (I, II, IV, and V) NOT DETECTED NOT DETECTED Final    Comment: Performed at [facility], [address], [place], [place] [address]      Studies:  Dg Chest 2 View  Result Date: 12/28/2017 CLINICAL DATA:   Cough and fever for 1 day. History of breast cancer. EXAM: CHEST  2 VIEW COMPARISON:  09/23/2017 chest radiograph and prior studies FINDINGS: The cardiomediastinal silhouette is unremarkable. A right Port-A-Cath is again identified with tip overlying the lower SVC. A 1.2 x 2 cm nodular opacity overlying the mid right lung is identified. The lungs are otherwise clear. There is no evidence of pleural effusion or pneumothorax. IMPRESSION: 1.2 x 2 cm nodular opacity overlying the mid right lung. Given this patient's history of breast cancer, chest CT with contrast is recommended. No evidence of focal airspace disease/pneumonia. Electronically Signed   By: Margarette Canada M.D.   On: 12/28/2017 11:25   Ct Chest W Contrast  Result Date: 12/28/2017 CLINICAL DATA:  42 year old female with cough and fever. Nodular opacity overlying the right mid lung on chest radiograph today. History of right breast cancer and bilateral mastectomies. EXAM: CT CHEST WITH CONTRAST TECHNIQUE: Multidetector CT imaging of the chest was performed during intravenous contrast administration. CONTRAST:  75 cc intravenous Isovue-300 COMPARISON:  12/28/2017 and prior chest radiographs. 07/02/2017 chest CT FINDINGS: Cardiovascular: Normal heart size. A right IJ central venous catheter is noted with tip at the superior cavoatrial junction. There is no evidence of thoracic aortic aneurysm. No pericardial effusion. Mediastinum/Nodes: No enlarged mediastinal, hilar, or axillary lymph nodes. Calcified right hilar lymph nodes are again noted. Thyroid gland, trachea, and esophagus demonstrate no significant findings. Lungs/Pleura: A 2.3 x 2.7 x 1.2 cm irregular masslike opacity within the right middle lobe is noted and contains 2 slightly coarse calcifications. This is suspicious for malignancy/metastasis but could represent unusual inflammatory or infectious process. No other pulmonary nodules or masses are identified. No airspace disease, pleural effusion or  pneumothorax noted. Upper Abdomen: No acute or significant abnormalities. Musculoskeletal: New sclerotic  lesions are noted as follows and worrisome for metastatic disease: A 3 mm right sternal/manubrial lesion (7:52) A 1 mm sternal lesion (7:71) A 3 mm lesion within the body of T4 (7:45) IMPRESSION: 1. 2.3 x 2.7 x 1.2 cm irregular masslike opacity within the right middle lobe worrisome for malignancy/metastasis. 2. 3 new very small sclerotic lesions, within the sternum and the body of T4, worrisome for new metastases. Electronically Signed   By: Margarette Canada M.D.   On: 12/28/2017 13:14   Ct Abdomen Pelvis W Contrast  Result Date: 12/29/2017 CLINICAL DATA:  Abdominal pain, history of RIGHT breast cancer stage IB (ER/PR positive, HER2 Neu positive), sinus tachycardia, development of fever, headache and coughing last week, husband had the flu, generalized aches, nausea, vomiting and diarrhea EXAM: CT ABDOMEN AND PELVIS WITH CONTRAST TECHNIQUE: Multidetector CT imaging of the abdomen and pelvis was performed using the standard protocol following bolus administration of intravenous contrast. Sagittal and coronal MPR images reconstructed from axial data set. CONTRAST:  164m ISOVUE-300 IOPAMIDOL (ISOVUE-300) INJECTION 61% IV. Patient refused oral contrast. COMPARISON:  09/05/2017 FINDINGS: Lower chest: Minimal bibasilar atelectasis Hepatobiliary: Tiny questionable low-attenuation focus RIGHT lobe liver image 9. Gallbladder and liver otherwise normal appearance Pancreas: Normal appearance Spleen: Normal appearance Adrenals/Urinary Tract: Small LEFT renal cyst. Adrenal glands, kidneys, ureters, and bladder otherwise normal appearance Stomach/Bowel: Normal appendix. Stomach and bowel loops normal appearance for technique Vascular/Lymphatic: Vascular structures unremarkable. No adenopathy. Reproductive: Uterus and adnexa normal appearance Other: No free air or free fluid. No acute inflammatory process identified.  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 IMPRESSION: No definite acute intra-abdominal or intrapelvic abnormalities. 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 Electronically Signed   By: MLavonia DanaM.D.   On: 12/29/2017 13:04    Assessment: 42y.o. female with recently diagnosed stage IIA triple positive breast cancer, status post surgery and recently finished adjuvant chemotherapy, was admitted for fever, fatigue, sore throat and diarrhea.  1.  Influenza A infection 2.  Stage II a ER/PR/HER-2 triple positive breast cancer, status post surgery and adjuvant chemotherapy 3.  Right middle lobe lung lesion and bone lesions, suspicious for breast cancer metastasis 4.  Nausea, vomiting, diarrhea likely secondary to chemo 5.  Anemia and thrombocytopenia, secondary to chemotherapy 6.  Hypokalemia, resolved  Plan:  -I have reviewed her recent CT chest, abdomen and pelvis scan findings with patient and her husband in details.  Unfortunately, the new 2.7 cm irregular mass in the right middle lobe lung, and a small tiny bone lesions in the sternum and T4 are suspicious for metastatic disease, likely from her breast cancer.  Recommend to get a PET scan in 2 weeks as outpatient, and determine if biopsy is needed.  There is a small chance the lung lesion could be related to her recent influenza infection, it does not significantly improve in 2 weeks, biopsy will be warranted. -Encouraged her to focus on recovery from her current infection -We briefly discussed the treatment options if biopsy confirms metastatic breast cancer.  Adjuvant therapy and Kadcyla, since she developed metastasis during her adjuvant chemotherapy.  -I will f/u as needed, I very much appreciated the excellent care from the hospitalist team  YTruitt Merle 12/29/2017    YTruitt Merle MD 12/29/2017  11:22 PM

## 2017-12-29 NOTE — Progress Notes (Addendum)
[facility]                                                                              Patient Demographics  Sheila [name], is a 42 y.o. female, DOB - December 24, 1975, XTA:[id number]  Admit date - 12/28/2017   Admitting Physician Ripudeep Krystal Eaton, MD  Outpatient Primary MD for the patient is [name], MD  Outpatient specialists:   LOS - 1  days   Medical records reviewed and are as summarized below:    Chief Complaint  Patient presents with  . Fever  . Cough  . Headache       Brief summary   Patient is a 42 year old female with history of right breast CA (oncologist Dr. Burr Medico) ER, PR positive, HER2 positive, stage Ib status post bilateral mastectomies on adjuvant chemotherapy with TCHP last 2 weeks ago, sinus tachycardia (evaluated by Dr. Aundra Dubin) presented to ED with fevers, headache, coughing that started last night.  Patient reported that her husband was hospitalized 3 days ago at [facility] with H1 N1 flu and was discharged on the day of admission.  Patient started having similar symptoms as her husband with fevers 102.9 F, headaches, productive cough, generalized aches.  She also reported nausea, vomiting and diarrhea for last 6 days.  Patient reported watery bowel movement every day and has been taking Lomotil.  Denied any abdominal pain, hematochezia or melena.  Assessment & Plan    Principal Problem:   Sepsis ([place]) secondary to influenza A -Patient met sepsis criteria at the time of admission with fever, tachycardia, lactic acidosis, dehydration, possibly due to influenza versus colitis.  Patient's husband with confirmed flu and currently on Tamiflu -Patient was empirically placed on Tamiflu, influenza panel positive for influenza A [id number] -Blood cultures negative so far, continue IV fluid hydration  Active Problems: Nausea, vomiting, diarrhea -Possibly due to influenza, gastroenteritis versus chemotherapy related enteritis -Continue IV fluid  hydration, antiemetics.  Patient was started on IV Zosyn in ED, will continue until CT abdomen results available.   -3 BMs today, C. 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  -Oncology has been consulted, discussed with Dr. Burr Medico (patient's oncologist), will see her today    Sinus tachycardia -Likely worsened due to #1, she was started on Coreg -2D echo 2/1 showed EF of 60%, no regional wall motion abnormalities -TSH 0.692, follows Dr. Aundra Dubin  Code Status: full  DVT Prophylaxis: Heparin subcu Family Communication: Discussed in detail with the patient, all imaging results, lab results explained to the patient and patient's husband at the bedside   Disposition Plan:  Time Spent in minutes 35 minutes  Procedures:  CT chest  Consultants:   Oncology  Antimicrobials:   Tamiflu started 2/10 pm  Zosyn 2/10>   Medications  Scheduled Meds: . benzonatate  100 mg Oral TID  . carvedilol  3.125 mg Oral BID WC  . cholecalciferol  2,000 Units Oral Daily  . fluticasone  1 spray Each Nare Daily  . heparin  5,000 Units Subcutaneous Q8H  . iopamidol      . mirtazapine  30 mg Oral QHS  . oseltamivir  75 mg Oral BID   Continuous Infusions: . sodium chloride 125 mL/hr at 12/29/17 1010  . piperacillin-tazobactam (ZOSYN)  IV 3.375 g (12/29/17 1011)   PRN Meds:.acetaminophen **OR** acetaminophen, diphenoxylate-atropine, guaiFENesin-dextromethorphan, HYDROcodone-acetaminophen, iopamidol, LORazepam, menthol-cetylpyridinium, ondansetron **OR** ondansetron (ZOFRAN) IV, phenol,  prochlorperazine, sodium chloride flush   Antibiotics   Anti-infectives (From admission, onward)   Start     Dose/Rate Route Frequency Ordered Stop   12/28/17 1800  piperacillin-tazobactam (ZOSYN) IVPB 3.375 g     3.375 g 12.5 mL/hr over 240 Minutes Intravenous Every 8 hours 12/28/17 1745     12/28/17 1630  oseltamivir (TAMIFLU) capsule 75 mg     75 mg Oral 2 times daily 12/28/17 1613 01/02/18 2159   12/28/17 1530  vancomycin (VANCOCIN) IVPB 1000 mg/200 mL premix     1,000 mg 200 mL/hr over 60 Minutes Intravenous  Once 12/28/17 1529 12/28/17 1849   12/28/17 1530  piperacillin-tazobactam (ZOSYN) IVPB 3.375 g  Status:  Discontinued     3.375 g 100 mL/hr over 30 Minutes Intravenous  Once 12/28/17 1529 12/28/17 1744        Subjective:   Sheila [name] was seen and examined today.  Still having diarrhea, 3 BMs this morning.  Spiking temp 100.9 F, sore throat, generalized body aches.  Just feeling weak.  Patient denies dizziness, chest pain, shortness of breath.    Objective:   Vitals:   12/28/17 2106 12/29/17 0115 12/29/17 0534 12/29/17 0759  BP: (!) 97/47 102/68 119/65 (!) 90/48  Pulse: (!) 122 (!) 117 (!) 124 (!) 122  Resp: '18 16 16 16  ' Temp: 99.3 F (37.4 C) 99.4 F (37.4 C) (!) 100.9 F (38.3 C)   TempSrc: Oral Oral Oral   SpO2: 97% 99% 100% 96%  Weight:      Height:        Intake/Output Summary (Last 24 hours) at 12/29/2017 1114 Last data filed at 12/29/2017 1011 Gross per 24 hour  Intake 4633.75 ml  Output 200 ml  Net 4433.75 ml     Wt Readings from Last 3 Encounters:  12/28/17 78.8 kg (173 lb 12.8 oz)  12/26/17 77.1 kg (170 lb)  12/19/17 77.9 kg (171 lb 12 oz)     Exam  General: Alert and oriented x 3, NAD  Eyes:   HEENT:  Atraumatic, normocephalic  Cardiovascular: S1 S2 clear, sinus tachycardia, RRR  Respiratory: Mild scattered rhonchi  Gastrointestinal: Soft, nontender, nondistended, + bowel sounds  Ext: no pedal edema bilaterally  Neuro:  no new deficits  Musculoskeletal: No digital cyanosis, clubbing  Skin: No rashes  Psych: Normal affect and demeanor, alert and oriented x3    Data Reviewed:  I have personally reviewed following labs and imaging studies  Micro Results Recent Results (from the past 240 hour(s))  Blood culture (routine x 2)     Status: None (Preliminary result)   Collection Time: 12/28/17  2:38 PM  Result Value Ref Range Status   Specimen Description   Final    BLOOD PORTA CATH Performed at [facility], [address] [place], [place] [address]  Special Requests   Final    BOTTLES DRAWN AEROBIC AND ANAEROBIC Blood Culture adequate volume Performed at [facility] [address], [place], [place] [address]    Culture PENDING  Incomplete   Report Status PENDING  Incomplete  Blood culture (routine x 2)     Status: None (Preliminary result)   Collection Time: 12/28/17  2:38 PM  Result Value Ref Range Status   Specimen Description   Final    BLOOD LEFT ANTECUBITAL Performed at [facility], [place] [address] [place], [place] [address]    Special Requests   Final    BOTTLES DRAWN AEROBIC AND ANAEROBIC Blood Culture adequate volume Performed at [facility] [address], [place], [place] [address]    Culture PENDING  Incomplete   Report Status PENDING  Incomplete  Respiratory Panel by PCR     Status: Abnormal   Collection Time: 12/28/17  4:32 PM  Result Value Ref Range Status   Adenovirus NOT DETECTED NOT DETECTED Final   Coronavirus 229E NOT DETECTED NOT DETECTED Final   Coronavirus HKU1 NOT DETECTED NOT DETECTED Final   Coronavirus NL63 NOT DETECTED NOT DETECTED Final   Coronavirus OC43 NOT DETECTED NOT DETECTED Final   Metapneumovirus NOT DETECTED NOT DETECTED Final   Rhinovirus / Enterovirus NOT DETECTED NOT DETECTED Final   Influenza A NOT DETECTED NOT DETECTED Final   Influenza A H1 NOT DETECTED NOT DETECTED Final   Influenza A H1  2009 DETECTED (A) NOT DETECTED Final   Influenza A H3 NOT DETECTED NOT DETECTED Final   Influenza B NOT DETECTED NOT DETECTED Final   Parainfluenza Virus 1 NOT DETECTED NOT DETECTED Final   Parainfluenza Virus 2 NOT DETECTED NOT DETECTED Final   Parainfluenza Virus 3 NOT DETECTED NOT DETECTED Final   Parainfluenza Virus 4 NOT DETECTED NOT DETECTED Final   Respiratory Syncytial Virus NOT DETECTED NOT DETECTED Final   Bordetella pertussis NOT DETECTED NOT DETECTED Final   Chlamydophila pneumoniae NOT DETECTED NOT DETECTED Final   Mycoplasma pneumoniae NOT DETECTED NOT DETECTED Final    Comment: Performed at [facility], [place] [address] [place], [place] [address]  C difficile quick scan w PCR reflex     Status: None   Collection Time: 12/29/17  5:38 AM  Result Value Ref Range Status   C Diff antigen NEGATIVE NEGATIVE Final   C Diff toxin NEGATIVE NEGATIVE Final   C Diff interpretation No C. difficile detected.  Final    Comment: Performed at [facility], [place] [address] [place], [place] [address]    Radiology Reports Dg Chest 2 View  Result Date: 12/28/2017 CLINICAL DATA:  Cough and fever for 1 day. History of breast cancer. EXAM: CHEST  2 VIEW COMPARISON:  09/23/2017 chest radiograph and prior studies FINDINGS: The cardiomediastinal silhouette is unremarkable. A right Port-A-Cath is again identified with tip overlying the lower SVC. A 1.2 x 2 cm nodular opacity overlying the mid right lung is identified. The lungs are otherwise clear. There is no evidence of pleural effusion or pneumothorax. IMPRESSION: 1.2 x 2 cm nodular opacity overlying the mid right lung. Given this patient's history of breast cancer, chest CT with contrast is recommended. No evidence of focal airspace disease/pneumonia. Electronically Signed   By: Margarette Canada M.D.   On: 12/28/2017 11:25   Ct Chest W Contrast  Result Date: 12/28/2017 CLINICAL DATA:  42 year old female with cough and fever.  Nodular opacity  overlying the right mid lung on chest radiograph today. History of right breast cancer and bilateral mastectomies. EXAM: CT CHEST WITH CONTRAST TECHNIQUE: Multidetector CT imaging of the chest was performed during intravenous contrast administration. CONTRAST:  75 cc intravenous Isovue-300 COMPARISON:  12/28/2017 and prior chest radiographs. 07/02/2017 chest CT FINDINGS: Cardiovascular: Normal heart size. A right IJ central venous catheter is noted with tip at the superior cavoatrial junction. There is no evidence of thoracic aortic aneurysm. No pericardial effusion. Mediastinum/Nodes: No enlarged mediastinal, hilar, or axillary lymph nodes. Calcified right hilar lymph nodes are again noted. Thyroid gland, trachea, and esophagus demonstrate no significant findings. Lungs/Pleura: A 2.3 x 2.7 x 1.2 cm irregular masslike opacity within the right middle lobe is noted and contains 2 slightly coarse calcifications. This is suspicious for malignancy/metastasis but could represent unusual inflammatory or infectious process. No other pulmonary nodules or masses are identified. No airspace disease, pleural effusion or pneumothorax noted. Upper Abdomen: No acute or significant abnormalities. Musculoskeletal: New sclerotic lesions are noted as follows and worrisome for metastatic disease: A 3 mm right sternal/manubrial lesion (7:52) A 1 mm sternal lesion (7:71) A 3 mm lesion within the body of T4 (7:45) IMPRESSION: 1. 2.3 x 2.7 x 1.2 cm irregular masslike opacity within the right middle lobe worrisome for malignancy/metastasis. 2. 3 new very small sclerotic lesions, within the sternum and the body of T4, worrisome for new metastases. Electronically Signed   By: Margarette Canada M.D.   On: 12/28/2017 13:14    Lab Data:  CBC: Recent Labs  Lab 12/26/17 2114 12/28/17 1053 12/29/17 0350  WBC 4.8 6.6 5.0  NEUTROABS  --  5.3  --   HGB 13.7 8.6* 7.9*  HCT 42.9 26.5* 25.2*  MCV 97.7 98.1 99.6  PLT 100* 124*  416*   Basic Metabolic Panel: Recent Labs  Lab 12/26/17 2114 12/28/17 1053 12/29/17 0350  NA 140 135 143  K 3.2* 3.9 3.5  CL 105 104 111  CO2 '25 24 23  ' GLUCOSE 104* 113* 101*  BUN 5* 9 6  CREATININE 0.94 1.19* 0.96  CALCIUM 8.9 8.6* 8.1*  MG 1.9  --   --    GFR: Estimated Creatinine Clearance: 83.3 mL/min (by C-G formula based on SCr of 0.96 mg/dL). Liver Function Tests: Recent Labs  Lab 12/26/17 2114 12/28/17 1053  AST 23 22  ALT 21 17  ALKPHOS 80 59  BILITOT 0.2* 0.4  PROT 6.5 6.1*  ALBUMIN 3.7 3.4*   Recent Labs  Lab 12/26/17 2114  LIPASE 31   No results for input(s): AMMONIA in the last 168 hours. Coagulation Profile: No results for input(s): INR, PROTIME in the last 168 hours. Cardiac Enzymes: No results for input(s): CKTOTAL, CKMB, CKMBINDEX, TROPONINI in the last 168 hours. BNP (last 3 results) No results for input(s): PROBNP in the last 8760 hours. HbA1C: No results for input(s): HGBA1C in the last 72 hours. CBG: No results for input(s): GLUCAP in the last 168 hours. Lipid Profile: No results for input(s): CHOL, HDL, LDLCALC, TRIG, CHOLHDL, LDLDIRECT in the last 72 hours. Thyroid Function Tests: Recent Labs    12/28/17 1830  TSH 0.692   Anemia Panel: No results for input(s): VITAMINB12, FOLATE, FERRITIN, TIBC, IRON, RETICCTPCT in the last 72 hours. Urine analysis:    Component Value Date/Time   COLORURINE STRAW (A) 12/28/2017 1311   APPEARANCEUR CLEAR 12/28/2017 1311   LABSPEC 1.011 12/28/2017 1311   PHURINE 6.0 12/28/2017 1311   GLUCOSEU NEGATIVE 12/28/2017 1311  HGBUR NEGATIVE 12/28/2017 1311   BILIRUBINUR NEGATIVE 12/28/2017 1311   KETONESUR NEGATIVE 12/28/2017 1311   PROTEINUR NEGATIVE 12/28/2017 1311   UROBILINOGEN 1.0 12/03/2014 2149   NITRITE NEGATIVE 12/28/2017 1311   LEUKOCYTESUR NEGATIVE 12/28/2017 1311     Ripudeep Rai M.D. [facility] 12/29/2017, 11:14 AM  Pager: [phone] Between 7am to 7pm - call Pager -  [phone]  After 7pm go to [email or web address] - password [email or web address]  Call night coverage person covering after 7pm

## 2017-12-29 NOTE — Progress Notes (Signed)
Sheila Corpus, NP notified regarding pt's BP 97/47 and last lactic acid drawn at 1438 was 2.2 and no recheck has been ordered. Will continue to monitor pt closely. Carnella Guadalajara I

## 2017-12-29 NOTE — Plan of Care (Signed)
  Progressing Health Behavior/Discharge Planning: Ability to manage health-related needs will improve 12/29/2017 1118 - Progressing by Kerrin Mo, RN Clinical Measurements: Ability to maintain clinical measurements within normal limits will improve 12/29/2017 1118 - Progressing by Kerrin Mo, RN Note MOnitor fevers.  Will remain free from infection 12/29/2017 1118 - Progressing by Kerrin Mo, RN Note +flu, on Tamiflu Diagnostic test results will improve 12/29/2017 1118 - Progressing by Kerrin Mo, RN Respiratory complications will improve 12/29/2017 1118 - Progressing by Kerrin Mo, RN Cardiovascular complication will be avoided 12/29/2017 1118 - Progressing by Kerrin Mo, RN Activity: Risk for activity intolerance will decrease 12/29/2017 1118 - Progressing by Kerrin Mo, RN Nutrition: Adequate nutrition will be maintained 12/29/2017 1118 - Progressing by Kerrin Mo, RN Elimination: Will not experience complications related to bowel motility 12/29/2017 1118 - Progressing by Kerrin Mo, RN Note +diarrhea, C Diff neg, GI pathogen panel pending, Lomotil given.  Pain Managment: General experience of comfort will improve 12/29/2017 1118 - Progressing by Kerrin Mo, RN Safety: Ability to remain free from injury will improve 12/29/2017 1118 - Progressing by Kerrin Mo, RN

## 2017-12-30 ENCOUNTER — Other Ambulatory Visit: Payer: Self-pay

## 2017-12-30 ENCOUNTER — Inpatient Hospital Stay (HOSPITAL_COMMUNITY): Payer: PRIVATE HEALTH INSURANCE

## 2017-12-30 DIAGNOSIS — B999 Unspecified infectious disease: Secondary | ICD-10-CM

## 2017-12-30 DIAGNOSIS — R5081 Fever presenting with conditions classified elsewhere: Secondary | ICD-10-CM

## 2017-12-30 DIAGNOSIS — R0902 Hypoxemia: Secondary | ICD-10-CM

## 2017-12-30 LAB — BASIC METABOLIC PANEL
Anion gap: 8 (ref 5–15)
BUN: <5 mg/dL — ABNORMAL LOW (ref 6–20)
CO2: 23 mmol/L (ref 22–32)
Calcium: 7.8 mg/dL — ABNORMAL LOW (ref 8.9–10.3)
Chloride: 111 mmol/L (ref 101–111)
Creatinine, Ser: 0.99 mg/dL (ref 0.44–1.00)
GFR calc Af Amer: >60 mL/min (ref >60)
GFR calc non Af Amer: >60 mL/min (ref >60)
Glucose, Bld: 121 mg/dL — ABNORMAL HIGH (ref 65–99)
Potassium: 3.3 mmol/L — ABNORMAL LOW (ref 3.5–5.1)
Sodium: 142 mmol/L (ref 135–145)

## 2017-12-30 LAB — GLUCOSE, CAPILLARY: Glucose-Capillary: 148 mg/dL — ABNORMAL HIGH (ref 65–99)

## 2017-12-30 LAB — URINE CULTURE: CULTURE: NO GROWTH

## 2017-12-30 LAB — CBC
HCT: 22.8 % — ABNORMAL LOW (ref 36.0–46.0)
HEMOGLOBIN: 7.2 g/dL — AB (ref 12.0–15.0)
MCH: 31.6 pg (ref 26.0–34.0)
MCHC: 31.6 g/dL (ref 30.0–36.0)
MCV: 100 fL (ref 78.0–100.0)
Platelets: 101 10*3/uL — ABNORMAL LOW (ref 150–400)
RBC: 2.28 MIL/uL — AB (ref 3.87–5.11)
RDW: 18.4 % — ABNORMAL HIGH (ref 11.5–15.5)
WBC: 5.6 10*3/uL (ref 4.0–10.5)

## 2017-12-30 LAB — MRSA PCR SCREENING: MRSA by PCR: NEGATIVE

## 2017-12-30 LAB — D-DIMER, QUANTITATIVE (NOT AT ARMC): D DIMER QUANT: 0.62 ug{FEU}/mL — AB (ref 0.00–0.50)

## 2017-12-30 MED ORDER — ORAL CARE MOUTH RINSE
15.0000 mL | Freq: Two times a day (BID) | OROMUCOSAL | Status: DC
  Administered 2017-12-31 – 2018-01-05 (×8): 15 mL via OROMUCOSAL

## 2017-12-30 MED ORDER — IBUPROFEN 200 MG PO TABS
400.0000 mg | ORAL_TABLET | ORAL | Status: DC | PRN
  Administered 2017-12-30 (×2): 400 mg via ORAL
  Filled 2017-12-30 (×3): qty 2

## 2017-12-30 MED ORDER — IOPAMIDOL (ISOVUE-370) INJECTION 76%
INTRAVENOUS | Status: AC
  Administered 2017-12-30: 100 mL
  Filled 2017-12-30: qty 100

## 2017-12-30 MED ORDER — ACETAMINOPHEN 650 MG RE SUPP: 650.0000 mg | RECTAL | Status: DC | PRN

## 2017-12-30 MED ORDER — ACETAMINOPHEN 325 MG PO TABS
325.0000 mg | ORAL_TABLET | Freq: Once | ORAL | Status: AC
  Administered 2017-12-30: 325 mg via ORAL
  Filled 2017-12-30: qty 1

## 2017-12-30 MED ORDER — SODIUM CHLORIDE 0.9 % IV BOLUS (SEPSIS)
500.0000 mL | Freq: Once | INTRAVENOUS | Status: AC
  Administered 2017-12-30: 500 mL via INTRAVENOUS

## 2017-12-30 MED ORDER — ACETAMINOPHEN 325 MG PO TABS
650.0000 mg | ORAL_TABLET | ORAL | Status: DC | PRN
  Administered 2017-12-30: 650 mg via ORAL
  Filled 2017-12-30: qty 2

## 2017-12-30 NOTE — Significant Event (Addendum)
Rapid Response Event Note  Overview:  Code [employer] called, notified by intercom system overhead. Upon arrival, numerous staff members at bedside and patient was alert. EKG being obtained from nursing staff and techs.   Initial Focused Assessment: Neuro: patient alert and oriented x4, able to follow commands. Patient able to move all extremities with ease. Patient did have sore throat pain.  Cardio: S1 and S2, ST:HR 120s, pulses 1+ radially and pedally in all extremities, BP WNL see vital signs  Pulmonary: Breath sounds clear and diminished in all lung fields, no evidence of respiratory distress. Patient's O2 Sats 91-92% on 4 Liters [place], patient was on RA before Code Blue call.   Interventions: EKG being obtained and MD Gherghe notified Transfer to [facility] (if not transferred):    Sakiyah Shur C

## 2017-12-30 NOTE — Plan of Care (Signed)
  Progressing Health Behavior/Discharge Planning: Ability to manage health-related needs will improve 12/30/2017 0909 - Progressing by Kerrin Mo, RN Clinical Measurements: Diagnostic test results will improve 12/30/2017 0909 - Progressing by Kerrin Mo, RN Cardiovascular complication will be avoided 12/30/2017 0909 - Progressing by Kerrin Mo, RN Elimination: Will not experience complications related to bowel motility 12/30/2017 0909 - Progressing by Kerrin Mo, RN Pain Managment: General experience of comfort will improve 12/30/2017 0909 - Progressing by Kerrin Mo, RN

## 2017-12-30 NOTE — Progress Notes (Signed)
Medications administered by student RN [id number] with supervision of Clinical Instructor Lynann Bologna MSN, RN-BC or patient's assigned RN.

## 2017-12-30 NOTE — Progress Notes (Signed)
Called to pt room by staff distress button. Upon entering room pt noted to be slumped over with NT holding her. Pt breathing with thready pulse, grey in color, eyes open, but not responding.Code called.  Pt transferred to bed with 3 staff assist. Upon lying on bed, pt began to arouse and was able to state her name and answer questions appropriately though does not recall event other than calling to ask to use the [facility]. RRT arrived, Dr Cruzita Lederer to bedside. Orders to transfer to stepdown and pt transferred in stable condition, A&Ox4, with RRT RNs and this Probation officer. Pt requested this Probation officer to call her husband and sister-in-law Nira Conn. Spoke w/ husband and updated him per pt request. Husband states he will call Heather.

## 2017-12-30 NOTE — Progress Notes (Signed)
PROGRESS NOTE  Sheila [name] OHY:[id number] DOB: 1976-04-26 DOA: 12/28/2017 PCP: [name], MD   LOS: 2 days   Brief Narrative / Interim history: 42 year old female with history of breast cancer (oncologist is Dr. Burr Medico), ER, PR positive, H ER 2+, status post bilateral mastectomies and on adjuvant chemotherapy with TCHP last 2 weeks ago, sinus tachycardia being evaluated by cardiology as an outpatient, presented to the ED with flulike symptoms.  Her husband was recently hospitalized with influenza, she was diagnosed with the same and was admitted to the hospital with sepsis due to influenza.  Assessment & Plan: Principal Problem:   Sepsis ([place]) Active Problems:   Malignant neoplasm of upper-outer quadrant of right breast in female, estrogen receptor positive (HCC)   Lactic acidosis   Lung mass   Diarrhea   Sinus tachycardia   Influenza A   Sepsis ([place]) secondary to influenza A -Patient metsepsiscriteria at the time of admission with fever, tachycardia, lactic acidosis, dehydration, possibly due to influenza versus colitis.  -Influenza A was positive -Continue Tamiflu, patient still persistently febrile today, placed on Zosyn empirically, continue  Syncopal episode -Patient had a syncopal episode and passed out while standing up today in the hospital room.  Nurse tech was at bedside and guided patient to the floor, CODE BLUE was called.  There are no reports that patient lost pulse. She has little recollection of these events.  She was transferred back to bed, where she returned to normal.  Evaluated patient at bedside, she is alert and oriented x4, vital signs are stable however she is requiring oxygen now.  Transfer to stepdown, obtain d-dimer, stat chest x-ray. EKG non ischemic  Nausea, vomiting, diarrhea -C. difficile negative and started on Lomotil -likely due to influenza, continue supportive management, continue IV fluids  Lung mass,new metastasis on CT chest in  the setting of known history of breast cancer -CT chest showed 2.3x2.7x1.2 cm irregular mass like opacity in the right middle lobe worrisome for malignancy/metastasis, 3 new small sclerotic lesions in the sternum and body of T4 -CT abdomen pelvis without acute findings -Oncology consulted  Sinus tachycardia -Likely worsened due to #1, she was started on Coreg which I will hold for today given syncopal episode,  -2D echo 2/1 showed EF of 60%, no regional wall motion abnormalities -TSH 0.692, follows Dr. Aundra Dubin    DVT prophylaxis: Heparin Code Status: Full code Family Communication: no family at bedside Disposition Plan: transfer to SDU  Consultants:   Oncology   Procedures:   None   Antimicrobials:  Tamiflu 2/10 >>  Zosyn 2/10 >>   Subjective: -Complains of a sore throat, no difficulty swallowing, complains of fevers and chills.  No chest pain, no palpitations.  No abdominal pain, complains of ongoing nausea without vomiting.  Complains of a poor appetite.  Objective: Vitals:   12/30/17 0735 12/30/17 1128 12/30/17 1200 12/30/17 1206  BP:  (!) 113/57  (!) 111/40  Pulse:  (!) 106    Resp:    18  Temp: (!) 103.3 F (39.6 C) (!) 101 F (38.3 C) 99.4 F (37.4 C)   TempSrc: Oral  Oral   SpO2:  93%  97%  Weight:      Height:        Intake/Output Summary (Last 24 hours) at 12/30/2017 1239 Last data filed at 12/30/2017 1000 Gross per 24 hour  Intake 3776.4 ml  Output 1600 ml  Net 2176.4 ml   Filed Weights   12/28/17 1051 12/28/17 1655  Weight: 77.1 kg (170 lb) 78.8 kg (173 lb 12.8 oz)    Examination:  Constitutional: NAD, weak appearing female  Eyes: lids and conjunctivae normal ENMT: Mucous membranes are moist. No oropharyngeal exudates, slightly erythematous Neck: normal, supple Respiratory: clear to auscultation bilaterally, no wheezing, no crackles. Normal respiratory effort. No accessory muscle use.  Cardiovascular: Regular rate and rhythm, no murmurs  / rubs / gallops.  Abdomen: no tenderness. Bowel sounds positive.  Musculoskeletal: no clubbing / cyanosis.  Skin: no rashes, lesions, ulcers. No induration Neurologic: CN 2-12 grossly intact. Strength 5/5 in all 4.  Psychiatric: Normal judgment and insight. Alert and oriented x 3. Normal mood.    Data Reviewed: I have independently reviewed following labs and imaging studies  CBC: Recent Labs  Lab 12/26/17 2114 12/28/17 1053 12/29/17 0350 12/30/17 0428  WBC 4.8 6.6 5.0 5.6  NEUTROABS  --  5.3  --   --   HGB 13.7 8.6* 7.9* 7.2*  HCT 42.9 26.5* 25.2* 22.8*  MCV 97.7 98.1 99.6 100.0  PLT 100* 124* 107* 035*   Basic Metabolic Panel: Recent Labs  Lab 12/26/17 2114 12/28/17 1053 12/29/17 0350 12/30/17 0428  NA 140 135 143 142  K 3.2* 3.9 3.5 3.3*  CL 105 104 111 111  CO2 _0 GLUCOSE 104* 113* 101* 121*  BUN 5* 9 6 <5*  CREATININE 0.94 1.19* 0.96 0.99  CALCIUM 8.9 8.6* 8.1* 7.8*  MG 1.9  --   --   --    GFR: Estimated Creatinine Clearance: 80.8 mL/min (by C-G formula based on SCr of 0.99 mg/dL). Liver Function Tests: Recent Labs  Lab 12/26/17 2114 12/28/17 1053  AST 23 22  ALT 21 17  ALKPHOS 80 59  BILITOT 0.2* 0.4  PROT 6.5 6.1*  ALBUMIN 3.7 3.4*   Recent Labs  Lab 12/26/17 2114  LIPASE 31   No results for input(s): AMMONIA in the last 168 hours. Coagulation Profile: No results for input(s): INR, PROTIME in the last 168 hours. Cardiac Enzymes: No results for input(s): CKTOTAL, CKMB, CKMBINDEX, TROPONINI in the last 168 hours. BNP (last 3 results) No results for input(s): PROBNP in the last 8760 hours. HbA1C: No results for input(s): HGBA1C in the last 72 hours. CBG: Recent Labs  Lab 12/30/17 1125  GLUCAP 148*   Lipid Profile: No results for input(s): CHOL, HDL, LDLCALC, TRIG, CHOLHDL, LDLDIRECT in the last 72 hours. Thyroid Function Tests: Recent Labs    12/28/17 1830  TSH 0.692   Anemia Panel: No results for input(s):  VITAMINB12, FOLATE, FERRITIN, TIBC, IRON, RETICCTPCT in the last 72 hours. Urine analysis:    Component Value Date/Time   COLORURINE STRAW (A) 12/28/2017 1311   APPEARANCEUR CLEAR 12/28/2017 1311   LABSPEC 1.011 12/28/2017 1311   PHURINE 6.0 12/28/2017 1311   GLUCOSEU NEGATIVE 12/28/2017 1311   HGBUR NEGATIVE 12/28/2017 1311   BILIRUBINUR NEGATIVE 12/28/2017 1311   KETONESUR NEGATIVE 12/28/2017 1311   PROTEINUR NEGATIVE 12/28/2017 1311   UROBILINOGEN 1.0 12/03/2014 2149   NITRITE NEGATIVE 12/28/2017 1311   LEUKOCYTESUR NEGATIVE 12/28/2017 1311   Sepsis Labs: Invalid input(s): PROCALCITONIN, LACTICIDVEN  Recent Results (from the past 240 hour(s))  Urine culture     Status: None   Collection Time: 12/28/17  2:21 PM  Result Value Ref Range Status   Specimen Description   Final    URINE, RANDOM Performed at [facility] [address], [place], [place] [address]    Special Requests  Final    NONE Performed at [facility], [place] [address] [place], [place] [address]    Culture   Final    NO GROWTH Performed at [facility], [place] [address], [place], [place] [address]    Report Status 12/30/2017 FINAL  Final  Blood culture (routine x 2)     Status: None (Preliminary result)   Collection Time: 12/28/17  2:38 PM  Result Value Ref Range Status   Specimen Description   Final    BLOOD PORTA CATH Performed at [facility], [place] [address], [place], [place] [address]    Special Requests   Final    BOTTLES DRAWN AEROBIC AND ANAEROBIC Blood Culture adequate volume Performed at [facility] [address], [place], [place] [address]    Culture   Final    NO GROWTH 1 DAY Performed at [facility], [place] [address] [place], [place] [address]    Report Status PENDING  Incomplete  Blood culture (routine x 2)     Status: None (Preliminary result)   Collection Time: 12/28/17  2:38 PM  Result Value Ref Range  Status   Specimen Description   Final    BLOOD LEFT ANTECUBITAL Performed at [facility], [place] [address] [place], [place] [address]    Special Requests   Final    BOTTLES DRAWN AEROBIC AND ANAEROBIC Blood Culture adequate volume Performed at [facility] [address] [place], [place] [address]    Culture   Final    NO GROWTH 1 DAY Performed at [facility], [place] [address] [place], [place] [address]    Report Status PENDING  Incomplete  Respiratory Panel by PCR     Status: Abnormal   Collection Time: 12/28/17  4:32 PM  Result Value Ref Range Status   Adenovirus NOT DETECTED NOT DETECTED Final   Coronavirus 229E NOT DETECTED NOT DETECTED Final   Coronavirus HKU1 NOT DETECTED NOT DETECTED Final   Coronavirus NL63 NOT DETECTED NOT DETECTED Final   Coronavirus OC43 NOT DETECTED NOT DETECTED Final   Metapneumovirus NOT DETECTED NOT DETECTED Final   Rhinovirus / Enterovirus NOT DETECTED NOT DETECTED Final   Influenza A NOT DETECTED NOT DETECTED Final   Influenza A H1 NOT DETECTED NOT DETECTED Final   Influenza A H1 2009 DETECTED (A) NOT DETECTED Final   Influenza A H3 NOT DETECTED NOT DETECTED Final   Influenza B NOT DETECTED NOT DETECTED Final   Parainfluenza Virus 1 NOT DETECTED NOT DETECTED Final   Parainfluenza Virus 2 NOT DETECTED NOT DETECTED Final   Parainfluenza Virus 3 NOT DETECTED NOT DETECTED Final   Parainfluenza Virus 4 NOT DETECTED NOT DETECTED Final   Respiratory Syncytial Virus NOT DETECTED NOT DETECTED Final   Bordetella pertussis NOT DETECTED NOT DETECTED Final   Chlamydophila pneumoniae NOT DETECTED NOT DETECTED Final   Mycoplasma pneumoniae NOT DETECTED NOT DETECTED Final    Comment: Performed at [facility], [place]. [address] [place], [place] [address]  C difficile quick scan w PCR reflex     Status: None   Collection Time: 12/29/17  5:38 AM  Result Value Ref Range Status   C Diff antigen NEGATIVE NEGATIVE Final   C  Diff toxin NEGATIVE NEGATIVE Final   C Diff interpretation No C. difficile detected.  Final    Comment: Performed at [facility], [place] [address] [place], [place] [address]  Gastrointestinal Panel by PCR , Stool     Status: None   Collection Time: 12/29/17  5:38 AM  Result Value Ref Range Status   Campylobacter species NOT DETECTED NOT DETECTED Final   Plesimonas shigelloides NOT DETECTED NOT DETECTED Final   Salmonella species NOT DETECTED NOT DETECTED Final   Yersinia enterocolitica NOT DETECTED NOT DETECTED Final   Vibrio species NOT DETECTED NOT DETECTED Final   Vibrio cholerae NOT DETECTED NOT DETECTED Final   Enteroaggregative E coli (EAEC) NOT DETECTED NOT DETECTED Final   Enteropathogenic E coli (EPEC) NOT DETECTED NOT DETECTED Final   Enterotoxigenic E coli (ETEC) NOT DETECTED NOT DETECTED Final   Shiga like toxin producing E coli (STEC) NOT DETECTED NOT DETECTED Final   E. coli O157 NOT DETECTED NOT DETECTED Final   Shigella/Enteroinvasive E coli (EIEC) NOT DETECTED NOT DETECTED Final   Cryptosporidium NOT DETECTED NOT DETECTED Final   Cyclospora cayetanensis NOT DETECTED NOT DETECTED Final   Entamoeba histolytica NOT DETECTED NOT DETECTED Final   Giardia lamblia NOT DETECTED NOT DETECTED Final   Adenovirus F40/41 NOT DETECTED NOT DETECTED Final   Astrovirus NOT DETECTED NOT DETECTED Final   Norovirus GI/GII NOT DETECTED NOT DETECTED Final   Rotavirus A NOT DETECTED NOT DETECTED Final   Sapovirus (I, II, IV, and V) NOT DETECTED NOT DETECTED Final    Comment: Performed at [facility], [address], [place], [place] [address]      Radiology Studies: Ct Chest W Contrast  Result Date: 12/28/2017 CLINICAL DATA:  42 year old female with cough and fever. Nodular opacity overlying the right mid lung on chest radiograph today. History of right breast cancer and bilateral mastectomies. EXAM: CT CHEST WITH CONTRAST TECHNIQUE: Multidetector CT imaging  of the chest was performed during intravenous contrast administration. CONTRAST:  75 cc intravenous Isovue-300 COMPARISON:  12/28/2017 and prior chest radiographs. 07/02/2017 chest CT FINDINGS: Cardiovascular: Normal heart size. A right IJ central venous catheter is noted with tip at the superior cavoatrial junction. There is no evidence of thoracic aortic aneurysm. No pericardial effusion. Mediastinum/Nodes: No enlarged mediastinal, hilar, or axillary lymph nodes. Calcified right hilar lymph nodes are again noted. Thyroid gland, trachea, and esophagus demonstrate no significant findings. Lungs/Pleura: A 2.3 x 2.7 x 1.2 cm irregular masslike opacity within the right middle lobe is noted and contains 2 slightly coarse calcifications. This is suspicious for malignancy/metastasis but could represent unusual inflammatory or infectious process. No other pulmonary nodules or masses are identified. No airspace disease, pleural effusion or pneumothorax noted. Upper Abdomen: No acute or significant abnormalities. Musculoskeletal: New sclerotic lesions are noted as follows and worrisome for metastatic disease: A 3 mm right sternal/manubrial lesion (7:52) A 1 mm sternal lesion (7:71) A 3 mm lesion within the body of T4 (7:45) IMPRESSION: 1. 2.3 x 2.7 x 1.2 cm irregular masslike opacity within the right middle lobe worrisome for malignancy/metastasis. 2. 3 new very small sclerotic lesions, within the sternum and the body of T4, worrisome for new metastases. Electronically Signed   By: Margarette Canada M.D.   On: 12/28/2017 13:14   Ct Abdomen Pelvis W Contrast  Result Date: 12/29/2017 CLINICAL DATA:  Abdominal pain, history of RIGHT breast cancer stage IB (ER/PR positive, HER2 Neu positive), sinus tachycardia, development of fever, headache and coughing last week, husband had the flu, generalized aches, nausea, vomiting and diarrhea EXAM: CT ABDOMEN AND PELVIS WITH CONTRAST TECHNIQUE: Multidetector CT imaging of the abdomen and  pelvis was performed using the standard  protocol following bolus administration of intravenous contrast. Sagittal and coronal MPR images reconstructed from axial data set. CONTRAST:  165m ISOVUE-300 IOPAMIDOL (ISOVUE-300) INJECTION 61% IV. Patient refused oral contrast. COMPARISON:  09/05/2017 FINDINGS: Lower chest: Minimal bibasilar atelectasis Hepatobiliary: Tiny questionable low-attenuation focus RIGHT lobe liver image 9. Gallbladder and liver otherwise normal appearance Pancreas: Normal appearance Spleen: Normal appearance Adrenals/Urinary Tract: Small LEFT renal cyst. Adrenal glands, kidneys, ureters, and bladder otherwise normal appearance Stomach/Bowel: Normal appendix. Stomach and bowel loops normal appearance for technique Vascular/Lymphatic: Vascular structures unremarkable. No adenopathy. Reproductive: Uterus and adnexa normal appearance Other: No free air or free fluid. No acute inflammatory process identified. 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 IMPRESSION: No definite acute intra-abdominal or intrapelvic abnormalities. 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 Electronically Signed   By: MLavonia DanaM.D.   On: 12/29/2017 13:04     Scheduled Meds: . benzonatate  100 mg Oral TID  . cholecalciferol  2,000 Units Oral Daily  . fluticasone  1 spray Each Nare Daily  . heparin  5,000 Units Subcutaneous Q8H  . mirtazapine  30 mg Oral QHS  . oseltamivir  75 mg Oral BID   Continuous Infusions: . sodium chloride 125 mL/hr at 12/30/17 0629  . piperacillin-tazobactam (ZOSYN)  IV 3.375 g (12/30/17 1056)     CMarzetta Board MD, PhD [facility] Pager [phone] If 7PM-7AM, please contact night-coverage [email or web address] Password [email or web address]2/10/2018, 12:39 PM

## 2017-12-30 NOTE — Progress Notes (Signed)
Spiritual Care Note  Covering for [facility] chaplain team today. Responded to code on 4W; no family present and of course pt not available at that time.  Spiritual Care is available for f/u support whenever desired. Please page for urgent needs. Will also follow pt from [facility] side.   [place], [place], [address] [facility] M-F daytime pager [phone] [facility] 24/7 pager [phone] Voicemail [phone]

## 2017-12-30 NOTE — Progress Notes (Signed)
Sheila [name]   JEH:[birth year]   YO#:[id number]   DXA#:[id number]  ONCOLOGY FOLLOW UP   Subjective: Patient had an episode of syncope around noon today when she stood up to use bedside commode.  She lost consciousness briefly, but recovered fully right away without any other neurological symptoms.  She was found to be hypoxic, was transferred to ICU stepdown unit.  I saw her in the ICU, she denies any headaches or vision change, no chest pain on nasal cannula oxygen, she remains to be febrile today.  Objective:  Vitals:   12/30/17 1932 12/30/17 2000  BP:  (!) 89/40  Pulse:    Resp:  17  Temp: 100.1 F (37.8 C)   SpO2:  97%    Body mass index is 27.79 kg/m.  Intake/Output Summary (Last 24 hours) at 12/30/2017 2101 Last data filed at 12/30/2017 2000 Gross per 24 hour  Intake 3786.4 ml  Output 1200 ml  Net 2586.4 ml     Sclerae unicteric  Lungs clear -- no rales or rhonchi  Heart regular rate and rhythm  Abdomen benign  MSK no focal spinal tenderness, no peripheral edema  Neuro nonfocal   CBG (last 3)  Recent Labs    12/30/17 1125  GLUCAP 148*     Labs:  Lab Results  Component Value Date   WBC 5.6 12/30/2017   HGB 7.2 (L) 12/30/2017   HCT 22.8 (L) 12/30/2017   MCV 100.0 12/30/2017   PLT 101 (L) 12/30/2017   NEUTROABS 5.3 12/28/2017   CMP Latest Ref Rng & Units 12/30/2017 12/29/2017 12/28/2017  Glucose 65 - 99 mg/dL 121(H) 101(H) 113(H)  BUN 6 - 20 mg/dL <5(L) 6 9  Creatinine 0.44 - 1.00 mg/dL 0.99 0.96 1.19(H)  Sodium 135 - 145 mmol/L 142 143 135  Potassium 3.5 - 5.1 mmol/L 3.3(L) 3.5 3.9  Chloride 101 - 111 mmol/L 111 111 104  CO2 22 - 32 mmol/L _0 Calcium 8.9 - 10.3 mg/dL 7.8(L) 8.1(L) 8.6(L)  Total Protein 6.5 - 8.1 g/dL - - 6.1(L)  Total Bilirubin 0.3 - 1.2 mg/dL - - 0.4  Alkaline Phos 38 - 126 U/L - - 59  AST 15 - 41 U/L - - 22  ALT 14 - 54 U/L - - 17     Urine Studies No results for input(s): UHGB, CRYS in the last 72 hours.  Invalid  input(s): UACOL, UAPR, USPG, UPH, UTP, UGL, UKET, UBIL, UNIT, UROB, ULEU, UEPI, UWBC, URBC, [place], [place]  Basic Metabolic Panel: Recent Labs  Lab 12/26/17 2114 12/28/17 1053 12/29/17 0350 12/30/17 0428  NA 140 135 143 142  K 3.2* 3.9 3.5 3.3*  CL 105 104 111 111  CO2 _1 GLUCOSE 104* 113* 101* 121*  BUN 5* 9 6 <5*  CREATININE 0.94 1.19* 0.96 0.99  CALCIUM 8.9 8.6* 8.1* 7.8*  MG 1.9  --   --   --    GFR Estimated Creatinine Clearance: 80.5 mL/min (by C-G formula based on SCr of 0.99 mg/dL). Liver Function Tests: Recent Labs  Lab 12/26/17 2114 12/28/17 1053  AST 23 22  ALT 21 17  ALKPHOS 80 59  BILITOT 0.2* 0.4  PROT 6.5 6.1*  ALBUMIN 3.7 3.4*   Recent Labs  Lab 12/26/17 2114  LIPASE 31   No results for input(s): AMMONIA in the last 168 hours. Coagulation profile No results for input(s): INR, PROTIME in the last 168 hours.  CBC: Recent Labs  Lab 12/26/17 2114 12/28/17 1053 12/29/17 0350 12/30/17 0428  WBC 4.8 6.6 5.0 5.6  NEUTROABS  --  5.3  --   --   HGB 13.7 8.6* 7.9* 7.2*  HCT 42.9 26.5* 25.2* 22.8*  MCV 97.7 98.1 99.6 100.0  PLT 100* 124* 107* 101*   Cardiac Enzymes: No results for input(s): CKTOTAL, CKMB, CKMBINDEX, TROPONINI in the last 168 hours. BNP: Invalid input(s): POCBNP CBG: Recent Labs  Lab 12/30/17 1125  GLUCAP 148*   D-Dimer Recent Labs    12/30/17 1246  DDIMER 0.62*   Hgb A1c No results for input(s): HGBA1C in the last 72 hours. Lipid Profile No results for input(s): CHOL, HDL, LDLCALC, TRIG, CHOLHDL, LDLDIRECT in the last 72 hours. Thyroid function studies Recent Labs    12/28/17 1830  TSH 0.692   Anemia work up No results for input(s): VITAMINB12, FOLATE, FERRITIN, TIBC, IRON, RETICCTPCT in the last 72 hours. Microbiology Recent Results (from the past 240 hour(s))  Urine culture     Status: None   Collection Time: 12/28/17  2:21 PM  Result Value Ref Range Status   Specimen Description   Final     URINE, RANDOM Performed at [facility] [address], [place], [place] [address]    Special Requests   Final    NONE Performed at [facility], [place], [place] [address] [place], [place] [address]    Culture   Final    NO GROWTH Performed at [facility], [place] [address], [place], [place] [address]    Report Status 12/30/2017 FINAL  Final  Blood culture (routine x 2)     Status: None (Preliminary result)   Collection Time: 12/28/17  2:38 PM  Result Value Ref Range Status   Specimen Description   Final    BLOOD PORTA CATH Performed at [facility], [place] [address], [place], [place] [address]    Special Requests   Final    BOTTLES DRAWN AEROBIC AND ANAEROBIC Blood Culture adequate volume Performed at [facility] [address] [place], [place] [address]    Culture   Final    NO GROWTH 1 DAY Performed at [facility], [place] [address], [place], [place] [address]    Report Status PENDING  Incomplete  Blood culture (routine x 2)     Status: None (Preliminary result)   Collection Time: 12/28/17  2:38 PM  Result Value Ref Range Status   Specimen Description   Final    BLOOD LEFT ANTECUBITAL Performed at [facility], [place] [address], [place], [place] [address]    Special Requests   Final    BOTTLES DRAWN AEROBIC AND ANAEROBIC Blood Culture adequate volume Performed at [facility] [address], [place], [place] [address]    Culture   Final    NO GROWTH 1 DAY Performed at [facility], [place] [address], [place],  [address]    Report Status PENDING  Incomplete  Respiratory Panel by PCR     Status: Abnormal   Collection Time: 12/28/17  4:32 PM  Result Value Ref Range Status   Adenovirus NOT DETECTED NOT DETECTED Final   Coronavirus 229E NOT DETECTED NOT DETECTED Final   Coronavirus HKU1 NOT DETECTED NOT DETECTED Final   Coronavirus NL63 NOT DETECTED NOT DETECTED Final    Coronavirus OC43 NOT DETECTED NOT DETECTED Final   Metapneumovirus NOT DETECTED NOT DETECTED Final   Rhinovirus /  Enterovirus NOT DETECTED NOT DETECTED Final   Influenza A NOT DETECTED NOT DETECTED Final   Influenza A H1 NOT DETECTED NOT DETECTED Final   Influenza A H1 2009 DETECTED (A) NOT DETECTED Final   Influenza A H3 NOT DETECTED NOT DETECTED Final   Influenza B NOT DETECTED NOT DETECTED Final   Parainfluenza Virus 1 NOT DETECTED NOT DETECTED Final   Parainfluenza Virus 2 NOT DETECTED NOT DETECTED Final   Parainfluenza Virus 3 NOT DETECTED NOT DETECTED Final   Parainfluenza Virus 4 NOT DETECTED NOT DETECTED Final   Respiratory Syncytial Virus NOT DETECTED NOT DETECTED Final   Bordetella pertussis NOT DETECTED NOT DETECTED Final   Chlamydophila pneumoniae NOT DETECTED NOT DETECTED Final   Mycoplasma pneumoniae NOT DETECTED NOT DETECTED Final    Comment: Performed at [facility], [place] [address] [place], [place] [address]  C difficile quick scan w PCR reflex     Status: None   Collection Time: 12/29/17  5:38 AM  Result Value Ref Range Status   C Diff antigen NEGATIVE NEGATIVE Final   C Diff toxin NEGATIVE NEGATIVE Final   C Diff interpretation No C. difficile detected.  Final    Comment: Performed at [facility], [place] [address] [place], [place] [address]  Gastrointestinal Panel by PCR , Stool     Status: None   Collection Time: 12/29/17  5:38 AM  Result Value Ref Range Status   Campylobacter species NOT DETECTED NOT DETECTED Final   Plesimonas shigelloides NOT DETECTED NOT DETECTED Final   Salmonella species NOT DETECTED NOT DETECTED Final   Yersinia enterocolitica NOT DETECTED NOT DETECTED Final   Vibrio species NOT DETECTED NOT DETECTED Final   Vibrio cholerae NOT DETECTED NOT DETECTED Final   Enteroaggregative E coli (EAEC) NOT DETECTED NOT DETECTED Final   Enteropathogenic E coli (EPEC) NOT DETECTED NOT DETECTED Final   Enterotoxigenic E coli  (ETEC) NOT DETECTED NOT DETECTED Final   Shiga like toxin producing E coli (STEC) NOT DETECTED NOT DETECTED Final   E. coli O157 NOT DETECTED NOT DETECTED Final   Shigella/Enteroinvasive E coli (EIEC) NOT DETECTED NOT DETECTED Final   Cryptosporidium NOT DETECTED NOT DETECTED Final   Cyclospora cayetanensis NOT DETECTED NOT DETECTED Final   Entamoeba histolytica NOT DETECTED NOT DETECTED Final   Giardia lamblia NOT DETECTED NOT DETECTED Final   Adenovirus F40/41 NOT DETECTED NOT DETECTED Final   Astrovirus NOT DETECTED NOT DETECTED Final   Norovirus GI/GII NOT DETECTED NOT DETECTED Final   Rotavirus A NOT DETECTED NOT DETECTED Final   Sapovirus (I, II, IV, and V) NOT DETECTED NOT DETECTED Final    Comment: Performed at [facility], [place]., [place], [place] [address]  MRSA PCR Screening     Status: None   Collection Time: 12/30/17 12:00 PM  Result Value Ref Range Status   MRSA by PCR NEGATIVE NEGATIVE Final    Comment:        The GeneXpert MRSA Assay (FDA approved for NASAL specimens only), is one component of a comprehensive MRSA colonization surveillance program. It is not intended to diagnose MRSA infection nor to guide or monitor treatment for MRSA infections. Performed at [facility], [place] [address], [place], [place] [address]       Studies:  Ct Head W & Wo Contrast  Result Date: 12/30/2017 CLINICAL DATA:  History of breast cancer on chemotherapy. Altered mental status. EXAM: CT HEAD WITHOUT AND WITH CONTRAST TECHNIQUE:  Contiguous axial images were obtained from the base of the skull through the vertex without and with intravenous contrast CONTRAST:  164m ISOVUE-370 IOPAMIDOL (ISOVUE-370) INJECTION 76% COMPARISON:  None. FINDINGS: Brain: No mass lesion, intraparenchymal hemorrhage or extra-axial collection. No evidence of acute cortical infarct. Brain parenchyma and CSF-containing spaces are normal for age. No contrast-enhancing  lesions. Vascular: No hyperdense vessel or unexpected calcification. Skull: Normal visualized skull base, calvarium and extracranial soft tissues. Sinuses/Orbits: Moderate right maxillary sinus mucosal thickening. Normal orbits. IMPRESSION: No acute intracranial abnormality or enhancing lesion. Electronically Signed   By: KUlyses JarredM.D.   On: 12/30/2017 17:28   Ct Angio Chest Pe W Or Wo Contrast  Result Date: 12/30/2017 CLINICAL DATA:  Breast cancer, status post bilateral mastectomy, on adjuvant chemotherapy. Admitted hospital with sepsis secondary to influenza. EXAM: CT ANGIOGRAPHY CHEST WITH CONTRAST TECHNIQUE: Multidetector CT imaging of the chest was performed using the standard protocol during bolus administration of intravenous contrast. Multiplanar CT image reconstructions and MIPs were obtained to evaluate the vascular anatomy. CONTRAST:  101mISOVUE-370 IOPAMIDOL (ISOVUE-370) INJECTION 76% COMPARISON:  Chest radiographs dated 12/30/2017. CT chest dated 12/28/2017. FINDINGS: Cardiovascular: Satisfactory opacification of the bilateral pulmonary arteries to the segmental level. No evidence of pulmonary embolism. The heart is normal in size.  No pericardial effusion. No evidence of thoracic aortic aneurysm or dissection. Right chest port terminates at the cavoatrial junction. Mediastinum/Nodes: Small mediastinal lymph nodes, including an 8 mm short axis low right paratracheal node. No suspicious hilar lymphadenopathy. Status post right axillary lymph node dissection. Lungs/Pleura: Focal/patchy opacities in the medial bilateral lower lobes (series 6/image 84), suspicious for pneumonia, less likely atelectasis. Tree-in-bud nodular opacities in the right upper lobe (series 6/image 59, compatible with multifocal pneumonia. Additional focal/patchy opacity in the lateral right middle lobe (series 6/image 72), similar to recent CT, possibly reflecting an additional site of infection. However, underlying tumor  is not excluded. Trace bilateral pleural effusions. No pneumothorax. Upper Abdomen: Visualized upper abdomen is grossly unremarkable. Musculoskeletal: Status post bilateral mastectomy. Stable tiny sclerotic lesions in the sternum and T4 vertebral body. Review of the MIP images confirms the above findings. IMPRESSION: No evidence of pulmonary embolism. Multifocal/patchy opacities in the right upper lobe, right middle lobe, and bilateral lower lobes, suspicious for multifocal pneumonia. Possible superimposed bilateral lower lobe atelectasis. Trace bilateral pleural effusions. Focal patchy/nodular opacity in the lateral right middle lobe, unchanged from recent CT, possibly related to the patient's multifocal infection. However, underlying tumor is not excluded. Consider follow-up CT chest in 4-6 weeks. Status post bilateral mastectomy with right axillary lymph node dissection. Electronically Signed   By: SrJulian Hy.D.   On: 12/30/2017 17:33   Ct Abdomen Pelvis W Contrast  Result Date: 12/29/2017 CLINICAL DATA:  Abdominal pain, history of RIGHT breast cancer stage IB (ER/PR positive, HER2 Neu positive), sinus tachycardia, development of fever, headache and coughing last week, husband had the flu, generalized aches, nausea, vomiting and diarrhea EXAM: CT ABDOMEN AND PELVIS WITH CONTRAST TECHNIQUE: Multidetector CT imaging of the abdomen and pelvis was performed using the standard protocol following bolus administration of intravenous contrast. Sagittal and coronal MPR images reconstructed from axial data set. CONTRAST:  10064mSOVUE-300 IOPAMIDOL (ISOVUE-300) INJECTION 61% IV. Patient refused oral contrast. COMPARISON:  09/05/2017 FINDINGS: Lower chest: Minimal bibasilar atelectasis Hepatobiliary: Tiny questionable low-attenuation focus RIGHT lobe liver image 9. Gallbladder and liver otherwise normal appearance Pancreas: Normal appearance Spleen: Normal appearance Adrenals/Urinary Tract: Small LEFT renal  cyst. Adrenal glands, kidneys, ureters,  and bladder otherwise normal appearance Stomach/Bowel: Normal appendix. Stomach and bowel loops normal appearance for technique Vascular/Lymphatic: Vascular structures unremarkable. No adenopathy. Reproductive: Uterus and adnexa normal appearance Other: No free air or free fluid. No acute inflammatory process identified. 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 IMPRESSION: No definite acute intra-abdominal or intrapelvic abnormalities. 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 Electronically Signed   By: Lavonia Dana M.D.   On: 12/29/2017 13:04   Dg Chest Port 1 View  Result Date: 12/30/2017 CLINICAL DATA:  42 year old with current history of metastatic breast cancer, presenting with shortness of breath while on chemotherapy. EXAM: PORTABLE CHEST 1 VIEW COMPARISON:  CT chest 12/28/2017, 07/02/2017. Chest x-rays 12/28/2017, 09/23/2017 and earlier. FINDINGS: Cardiac silhouette normal in size for AP portable technique, unchanged. Calcified right hilar lymph nodes and the mass in the right middle lobe are better seen on the recent prior CT. Hazy opacities involving the medial right lung base, not present on the prior examinations. No new pulmonary parenchymal abnormalities elsewhere in either lung. Normal pulmonary vascularity. No pneumothorax. Right jugular Port-A-Cath tip projects over the lower SVC, unchanged. IMPRESSION: 1. Likely developing pneumonia involving the medial right lung base. 2. The previously identified right middle lobe lung mass and the calcified right hilar lymph nodes are better seen on the recent CT. Electronically Signed   By: Evangeline Dakin M.D.   On: 12/30/2017 14:17    Assessment: 42 y.o. female with recently diagnosed stage IIA triple positive breast cancer, status post surgery and recently finished adjuvant chemotherapy, was  admitted for fever, fatigue, sore throat and diarrhea.  1.  Influenza A infection 2.  Persistent fever and sepsis 3. ?  Multifocal pneumonia, with hypoxia 4.  Syncope episode 2/12  5.  Stage II a ER/PR/HER-2 triple positive breast cancer, status post surgery and adjuvant chemotherapy 6.  Right middle lobe lung lesion and bone lesions, suspicious for breast cancer metastasis 7.  Nausea, vomiting, diarrhea likely secondary to chemo 8.  Anemia and thrombocytopenia, secondary to chemotherapy, worsening  9.  Hypokalemia, resolved  Plan:  -I have spoken with Dr. Cruzita Lederer in the late afternoon today. Due to her hypoxia and episode of syncope, we decided to had a CTA chest to rule out PE.  I also suggest a CT head with contrast to rule out brain metastasis due to the episode of syncope and recent CT scan finding suggestive for metastatic breast cancer. -Her CT was negative for PE or brain lesions, but showed multifocal patchy opacities in bilateral lungs, suspicious for multifocal pneumonia.  Given her persistent fever, and recent chemo, superimposed bacterial infection is a possibility. She has been on zosyn since 2/10, ? Adding vanco. Certainly this could be influenza A viral pneumonia which has high mortality. I would suggest ID consult  -Anemia has been getting worse, given her hypoxia, I would recommend blood transfusion 2u  -I really appreciate the excellent care from the hospitalist team -I will f/u as needed    [name]  12/30/2017

## 2017-12-30 NOTE — Progress Notes (Signed)
Upon morning rounds pt with fever of 103. PRN tylenol not due at this time. On call provider paged. New orders for 500 cc bolus and one time dose of tylenol 325 mg. Follow up temp 103.3. On coming RN made aware to notify MD.

## 2017-12-31 DIAGNOSIS — Z87891 Personal history of nicotine dependence: Secondary | ICD-10-CM

## 2017-12-31 DIAGNOSIS — Z9013 Acquired absence of bilateral breasts and nipples: Secondary | ICD-10-CM

## 2017-12-31 DIAGNOSIS — A4189 Other specified sepsis: Secondary | ICD-10-CM

## 2017-12-31 DIAGNOSIS — A419 Sepsis, unspecified organism: Principal | ICD-10-CM

## 2017-12-31 DIAGNOSIS — Z803 Family history of malignant neoplasm of breast: Secondary | ICD-10-CM

## 2017-12-31 DIAGNOSIS — Z853 Personal history of malignant neoplasm of breast: Secondary | ICD-10-CM

## 2017-12-31 DIAGNOSIS — J189 Pneumonia, unspecified organism: Secondary | ICD-10-CM | POA: Diagnosis present

## 2017-12-31 DIAGNOSIS — J09X1 Influenza due to identified novel influenza A virus with pneumonia: Secondary | ICD-10-CM

## 2017-12-31 DIAGNOSIS — Z959 Presence of cardiac and vascular implant and graft, unspecified: Secondary | ICD-10-CM

## 2017-12-31 LAB — CBC
HEMATOCRIT: 23.2 % — AB (ref 36.0–46.0)
Hemoglobin: 7.5 g/dL — ABNORMAL LOW (ref 12.0–15.0)
MCH: 32.1 pg (ref 26.0–34.0)
MCHC: 32.3 g/dL (ref 30.0–36.0)
MCV: 99.1 fL (ref 78.0–100.0)
Platelets: 91 10*3/uL — ABNORMAL LOW (ref 150–400)
RBC: 2.34 MIL/uL — ABNORMAL LOW (ref 3.87–5.11)
RDW: 18.4 % — AB (ref 11.5–15.5)
WBC: 7.4 10*3/uL (ref 4.0–10.5)

## 2017-12-31 LAB — URINALYSIS, COMPLETE (UACMP) WITH MICROSCOPIC
BILIRUBIN URINE: NEGATIVE
Bacteria, UA: NONE SEEN
Glucose, UA: NEGATIVE mg/dL
HGB URINE DIPSTICK: NEGATIVE
Ketones, ur: NEGATIVE mg/dL
LEUKOCYTES UA: NEGATIVE
Nitrite: NEGATIVE
PH: 6 (ref 5.0–8.0)
Protein, ur: NEGATIVE mg/dL
SPECIFIC GRAVITY, URINE: 1.008 (ref 1.005–1.030)

## 2017-12-31 LAB — BASIC METABOLIC PANEL
ANION GAP: 10 (ref 5–15)
BUN: 5 mg/dL — ABNORMAL LOW (ref 6–20)
CALCIUM: 6.1 mg/dL — AB (ref 8.9–10.3)
CO2: 20 mmol/L — AB (ref 22–32)
Chloride: 111 mmol/L (ref 101–111)
Creatinine, Ser: 0.85 mg/dL (ref 0.44–1.00)
GFR calc non Af Amer: >60 mL/min (ref >60)
Glucose, Bld: 103 mg/dL — ABNORMAL HIGH (ref 65–99)
Potassium: 4.2 mmol/L (ref 3.5–5.1)
Sodium: 141 mmol/L (ref 135–145)

## 2017-12-31 LAB — ALBUMIN: Albumin: 2.3 g/dL — ABNORMAL LOW (ref 3.5–5.0)

## 2017-12-31 LAB — URINALYSIS, ROUTINE W REFLEX MICROSCOPIC
Bilirubin Urine: NEGATIVE
GLUCOSE, UA: NEGATIVE mg/dL
Hgb urine dipstick: NEGATIVE
KETONES UR: NEGATIVE mg/dL
Leukocytes, UA: NEGATIVE
Nitrite: NEGATIVE
PH: 6 (ref 5.0–8.0)
Protein, ur: NEGATIVE mg/dL
Specific Gravity, Urine: 1.013 (ref 1.005–1.030)

## 2017-12-31 LAB — PREPARE RBC (CROSSMATCH)

## 2017-12-31 LAB — ABO/RH: ABO/RH(D): A POS

## 2017-12-31 MED ORDER — SODIUM CHLORIDE 0.9 % IV BOLUS (SEPSIS)
1000.0000 mL | Freq: Once | INTRAVENOUS | Status: AC
  Administered 2017-12-31: 1000 mL via INTRAVENOUS

## 2017-12-31 MED ORDER — VANCOMYCIN HCL 10 G IV SOLR
1500.0000 mg | Freq: Once | INTRAVENOUS | Status: AC
  Administered 2017-12-31: 1500 mg via INTRAVENOUS
  Filled 2017-12-31: qty 1500

## 2017-12-31 MED ORDER — SODIUM CHLORIDE 0.9 % IV SOLN
Freq: Once | INTRAVENOUS | Status: AC
  Administered 2017-12-31: 14:00:00 via INTRAVENOUS

## 2017-12-31 MED ORDER — CHLORHEXIDINE GLUCONATE CLOTH 2 % EX PADS
6.0000 | MEDICATED_PAD | Freq: Every day | CUTANEOUS | Status: DC
  Administered 2017-12-31 – 2018-01-04 (×4): 6 via TOPICAL

## 2017-12-31 MED ORDER — SODIUM CHLORIDE 0.9 % IV SOLN
2.0000 g | Freq: Once | INTRAVENOUS | Status: AC
  Administered 2017-12-31: 2 g via INTRAVENOUS
  Filled 2017-12-31: qty 20

## 2017-12-31 MED ORDER — VANCOMYCIN HCL IN DEXTROSE 750-5 MG/150ML-% IV SOLN
750.0000 mg | Freq: Two times a day (BID) | INTRAVENOUS | Status: AC
  Administered 2018-01-01 – 2018-01-05 (×9): 750 mg via INTRAVENOUS
  Filled 2017-12-31 (×11): qty 150

## 2017-12-31 NOTE — Progress Notes (Signed)
Pt SBP-80s, pt alert and oriented.  Schorr, NP notified.  500cc NS bolus ordered and given.

## 2017-12-31 NOTE — Progress Notes (Signed)
Paged Hospitalist APP:  "Re: [id number]. Pt has >1 L in bladder per bladder scan. Foley? Thanks"

## 2017-12-31 NOTE — Progress Notes (Signed)
Pts SBP-60-70s, pt alert and oriented.  Schorr, NP notified.  1L NS bolus ordered and given.

## 2017-12-31 NOTE — Progress Notes (Signed)
PROGRESS NOTE    Sheila [name]  CHE:[id number] DOB: 1976-08-06 DOA: 12/28/2017 PCP: [name], MD   Brief Narrative:  42 year old female with history of breast cancer (oncologist is Dr. Burr Medico), ER, PR positive, H ER 2+, status post bilateral mastectomies and on adjuvant chemotherapy with TCHP last 2 weeks ago, sinus tachycardia being evaluated by cardiology as an outpatient, presented to the ED with flulike symptoms.  Her husband was recently hospitalized with influenza, she was diagnosed with the same and was admitted to the hospital with sepsis due to influenza.  Assessment & Plan:   Principal Problem:   Sepsis ([place]) Active Problems:   Malignant neoplasm of upper-outer quadrant of right breast in female, estrogen receptor positive (HCC)   Lactic acidosis   Lung mass   Diarrhea   Sinus tachycardia   Influenza A   Fever due to infection   Hypoxia  Hypotension: improved this morning, but systolics overnight as low as 60's.  Suspect due to below and hypovolemia.  Seems to have improved after IVF.  - MAP goal 65, will continue to monitor  Sepsis (HCC)secondary to influenza A  Acute Hypoxic Respiratory Failure 2/2 multifocal pneumonia - CT of chest 2/12 with "multifocal/patchy opacities in RUL, RML, and bilateral lower lobes, suspicious for multifocal pneumonia" (see report - f/u imaging recommended) -Patient metsepsiscriteria at the time of admission with fever, tachycardia, lactic acidosis, dehydration, possibly due to influenza versus colitis.  -Influenza A was positive -Continue Tamiflu, last fever looks to be 2/12 ~1800, placed on Zosyn empirically for concern for intraabdominal infection with N/V/D, but notable CT abdomen pelvis on 2/11 without definite intra-abdominal or intrapelvic abnormality.  Will continue zosyn for now with developing multifocal pneumonia - Consulted ID for assistance given worsening clinical status (of note, MRSA pcr negative on  2/12)  Anemia: Will transfuse 2 units prbc   Thrombocytopenia: 2/2 sepsis, malignancy, chemo.  Continue to monitor.  As low as 81 on 2/1.   Hypocalcemia: corrected with albumin is 7.5.  replete and follow  NAGMA: ctm  Syncopal episode -Patient had a syncopal episode and passed out while standing up 2/12 in the hospital room.  Pt reports to me that she was using the bathroom at this time.  Nurse tech was at bedside and guided patient to the floor, CODE BLUE was called.  There are no reports that patient lost pulse. She has little recollection of these events.  She was transferred back to bed, where she returned to normal.  She was evaluated at bedside, and was alert and oriented x4, vital signs were stable however, but she had new oxygen requirement.  EKG non ischemic.  CTA negative for PE, but with multifocal pneumonia as above.  Head CT with and without contrast without acute intracranial abnormality or enhancing lesion.  Suspect this was vasovagal or secondary to orthostasis in setting of sepsis or increased vagal tone when using bathroom (but with low BP's suspect hypovolemia and orthostasis may be more likely).   Nausea, vomiting, diarrhea -C. difficile negative and started on Lomotil -likely due to influenza, continue supportive management, continue IV fluids  Lung mass,newmetastasis onCT chest inthe setting of known history of breast cancer -CT chest showed 2.3x2.7x1.2 cm irregular mass like opacity in the right middle lobe worrisome for malignancy/metastasis, 3 new small sclerotic lesions in the sternum and body of T4 -CT abdomen pelvis without acute findings -Oncology consulted  Sinus tachycardia -Likely worsened due to #1, she was started on Coreg which I will  hold for today given syncopal episode,  -2D echo 2/1 showed EF of 60%, no regional wall motion abnormalities -TSH 0.692, follows Dr. Aundra Dubin  DVT prophylaxis: heparin Code Status: full  Family Communication: none  at bedside Disposition Plan: pending   Consultants:   Oncology  ID  Procedures:   none  Antimicrobials:  Anti-infectives (From admission, onward)   Start     Dose/Rate Route Frequency Ordered Stop   12/28/17 1800  piperacillin-tazobactam (ZOSYN) IVPB 3.375 g     3.375 g 12.5 mL/hr over 240 Minutes Intravenous Every 8 hours 12/28/17 1745     12/28/17 1630  oseltamivir (TAMIFLU) capsule 75 mg     75 mg Oral 2 times daily 12/28/17 1613 01/02/18 2159   12/28/17 1530  vancomycin (VANCOCIN) IVPB 1000 mg/200 mL premix     1,000 mg 200 mL/hr over 60 Minutes Intravenous  Once 12/28/17 1529 12/28/17 1849   12/28/17 1530  piperacillin-tazobactam (ZOSYN) IVPB 3.375 g  Status:  Discontinued     3.375 g 100 mL/hr over 30 Minutes Intravenous  Once 12/28/17 1529 12/28/17 1744         Subjective: Notes some LH with sitting up. Denies SOB or CP. Diarrhea is better. Remembers fainting while having BM yesterday.   Objective: Vitals:   12/31/17 0441 12/31/17 0500 12/31/17 0600 12/31/17 0700  BP: (!) 85/50 (!) 102/53 (!) 115/58 (!) 88/36  Pulse:      Resp: _0 (!) 21  Temp:      TempSrc:      SpO2: 96%   95%  Weight:      Height:        Intake/Output Summary (Last 24 hours) at 12/31/2017 0752 Last data filed at 12/31/2017 0600 Gross per 24 hour  Intake 4650 ml  Output 1225 ml  Net 3425 ml   Filed Weights   12/28/17 1051 12/28/17 1655 12/30/17 1200  Weight: 77.1 kg (170 lb) 78.8 kg (173 lb 12.8 oz) 78.1 kg (172 lb 2.9 oz)    Examination:  General exam: Appears calm and comfortable  Respiratory system: No increased WOB, [place] in place, diffuse rhonchi Cardiovascular system: Tachycardic, S1 & S2 heard, RRR. No JVD, murmurs, rubs, gallops or clicks. Gastrointestinal system: Abdomen is nondistended, soft and nontender. No organomegaly or masses felt. Normal bowel sounds heard. Central nervous system: Alert and oriented. No focal neurological deficits. Extremities: Trace  edema bilaterally Skin: No rashes, lesions or ulcers Psychiatry: Judgement and insight appear normal. Mood & affect appropriate.     Data Reviewed: I have personally reviewed following labs and imaging studies  CBC: Recent Labs  Lab 12/26/17 2114 12/28/17 1053 12/29/17 0350 12/30/17 0428 12/31/17 0543  WBC 4.8 6.6 5.0 5.6 7.4  NEUTROABS  --  5.3  --   --   --   HGB 13.7 8.6* 7.9* 7.2* 7.5*  HCT 42.9 26.5* 25.2* 22.8* 23.2*  MCV 97.7 98.1 99.6 100.0 99.1  PLT 100* 124* 107* 101* 91*   Basic Metabolic Panel: Recent Labs  Lab 12/26/17 2114 12/28/17 1053 12/29/17 0350 12/30/17 0428 12/31/17 0543  NA 140 135 143 142 141  K 3.2* 3.9 3.5 3.3* 4.2  CL 105 104 111 111 111  CO2 _1 20*  GLUCOSE 104* 113* 101* 121* 103*  BUN 5* 9 6 <5* 5*  CREATININE 0.94 1.19* 0.96 0.99 0.85  CALCIUM 8.9 8.6* 8.1* 7.8* 6.1*  MG 1.9  --   --   --   --  GFR: Estimated Creatinine Clearance: 93.7 mL/min (by C-G formula based on SCr of 0.85 mg/dL). Liver Function Tests: Recent Labs  Lab 12/26/17 2114 12/28/17 1053  AST 23 22  ALT 21 17  ALKPHOS 80 59  BILITOT 0.2* 0.4  PROT 6.5 6.1*  ALBUMIN 3.7 3.4*   Recent Labs  Lab 12/26/17 2114  LIPASE 31   No results for input(s): AMMONIA in the last 168 hours. Coagulation Profile: No results for input(s): INR, PROTIME in the last 168 hours. Cardiac Enzymes: No results for input(s): CKTOTAL, CKMB, CKMBINDEX, TROPONINI in the last 168 hours. BNP (last 3 results) No results for input(s): PROBNP in the last 8760 hours. HbA1C: No results for input(s): HGBA1C in the last 72 hours. CBG: Recent Labs  Lab 12/30/17 1125  GLUCAP 148*   Lipid Profile: No results for input(s): CHOL, HDL, LDLCALC, TRIG, CHOLHDL, LDLDIRECT in the last 72 hours. Thyroid Function Tests: Recent Labs    12/28/17 1830  TSH 0.692   Anemia Panel: No results for input(s): VITAMINB12, FOLATE, FERRITIN, TIBC, IRON, RETICCTPCT in the last 72 hours. Sepsis  Labs: Recent Labs  Lab 12/26/17 2141 12/28/17 1239 12/28/17 1438 12/28/17 1631 12/29/17 1429  PROCALCITON  --   --   --  <0.10  --   LATICACIDVEN 1.04 0.37* 2.2*  --  0.9    Recent Results (from the past 240 hour(s))  Urine culture     Status: None   Collection Time: 12/28/17  2:21 PM  Result Value Ref Range Status   Specimen Description   Final    URINE, RANDOM Performed at [facility], [place] [address] [place], [place] [address]    Special Requests   Final    NONE Performed at [facility], [place] [address], [place], [place] [address]    Culture   Final    NO GROWTH Performed at [facility], [place] [address] [place], [place] [address]    Report Status 12/30/2017 FINAL  Final  Blood culture (routine x 2)     Status: None (Preliminary result)   Collection Time: 12/28/17  2:38 PM  Result Value Ref Range Status   Specimen Description   Final    BLOOD PORTA CATH Performed at [facility], [place] [address] [place], [place] [address]    Special Requests   Final    BOTTLES DRAWN AEROBIC AND ANAEROBIC Blood Culture adequate volume Performed at [place] [address] [place], [place] [address]    Culture   Final    NO GROWTH 1 DAY Performed at [facility], [place] [address], [place], [place] [address]    Report Status PENDING  Incomplete  Blood culture (routine x 2)     Status: None (Preliminary result)   Collection Time: 12/28/17  2:38 PM  Result Value Ref Range Status   Specimen Description   Final    BLOOD LEFT ANTECUBITAL Performed at [facility], [place] [address], [place], [place] [address]    Special Requests   Final    BOTTLES DRAWN AEROBIC AND ANAEROBIC Blood Culture adequate volume Performed at [place] [address] [place], [place] [address]    Culture   Final    NO GROWTH 1 DAY Performed at [facility], [place] [address], [place], [place]  [address]    Report Status PENDING  Incomplete  Respiratory Panel by PCR     Status:  Abnormal   Collection Time: 12/28/17  4:32 PM  Result Value Ref Range Status   Adenovirus NOT DETECTED NOT DETECTED Final   Coronavirus 229E NOT DETECTED NOT DETECTED Final   Coronavirus HKU1 NOT DETECTED NOT DETECTED Final   Coronavirus NL63 NOT DETECTED NOT DETECTED Final   Coronavirus OC43 NOT DETECTED NOT DETECTED Final   Metapneumovirus NOT DETECTED NOT DETECTED Final   Rhinovirus / Enterovirus NOT DETECTED NOT DETECTED Final   Influenza A NOT DETECTED NOT DETECTED Final   Influenza A H1 NOT DETECTED NOT DETECTED Final   Influenza A H1 2009 DETECTED (A) NOT DETECTED Final   Influenza A H3 NOT DETECTED NOT DETECTED Final   Influenza B NOT DETECTED NOT DETECTED Final   Parainfluenza Virus 1 NOT DETECTED NOT DETECTED Final   Parainfluenza Virus 2 NOT DETECTED NOT DETECTED Final   Parainfluenza Virus 3 NOT DETECTED NOT DETECTED Final   Parainfluenza Virus 4 NOT DETECTED NOT DETECTED Final   Respiratory Syncytial Virus NOT DETECTED NOT DETECTED Final   Bordetella pertussis NOT DETECTED NOT DETECTED Final   Chlamydophila pneumoniae NOT DETECTED NOT DETECTED Final   Mycoplasma pneumoniae NOT DETECTED NOT DETECTED Final    Comment: Performed at [facility], [place] [address] [place], [place] [address]  C difficile quick scan w PCR reflex     Status: None   Collection Time: 12/29/17  5:38 AM  Result Value Ref Range Status   C Diff antigen NEGATIVE NEGATIVE Final   C Diff toxin NEGATIVE NEGATIVE Final   C Diff interpretation No C. difficile detected.  Final    Comment: Performed at [facility], [place] [address] [place], [place] [address]  Gastrointestinal Panel by PCR , Stool     Status: None   Collection Time: 12/29/17  5:38 AM  Result Value Ref Range Status   Campylobacter species NOT DETECTED NOT DETECTED Final   Plesimonas shigelloides NOT DETECTED NOT DETECTED Final    Salmonella species NOT DETECTED NOT DETECTED Final   Yersinia enterocolitica NOT DETECTED NOT DETECTED Final   Vibrio species NOT DETECTED NOT DETECTED Final   Vibrio cholerae NOT DETECTED NOT DETECTED Final   Enteroaggregative E coli (EAEC) NOT DETECTED NOT DETECTED Final   Enteropathogenic E coli (EPEC) NOT DETECTED NOT DETECTED Final   Enterotoxigenic E coli (ETEC) NOT DETECTED NOT DETECTED Final   Shiga like toxin producing E coli (STEC) NOT DETECTED NOT DETECTED Final   E. coli O157 NOT DETECTED NOT DETECTED Final   Shigella/Enteroinvasive E coli (EIEC) NOT DETECTED NOT DETECTED Final   Cryptosporidium NOT DETECTED NOT DETECTED Final   Cyclospora cayetanensis NOT DETECTED NOT DETECTED Final   Entamoeba histolytica NOT DETECTED NOT DETECTED Final   Giardia lamblia NOT DETECTED NOT DETECTED Final   Adenovirus F40/41 NOT DETECTED NOT DETECTED Final   Astrovirus NOT DETECTED NOT DETECTED Final   Norovirus GI/GII NOT DETECTED NOT DETECTED Final   Rotavirus A NOT DETECTED NOT DETECTED Final   Sapovirus (I, II, IV, and V) NOT DETECTED NOT DETECTED Final    Comment: Performed at [facility], [place] [place],  [address]  MRSA PCR Screening     Status: None   Collection Time: 12/30/17 12:00 PM  Result Value Ref Range Status   MRSA by PCR NEGATIVE NEGATIVE Final    Comment:        The GeneXpert MRSA Assay (FDA approved for NASAL specimens only), is one component of a comprehensive MRSA  colonization surveillance program. It is not intended to diagnose MRSA infection nor to guide or monitor treatment for MRSA infections. Performed at [facility], [place] [address] [place], [place] [address]          Radiology Studies: Ct Head W & Wo Contrast  Result Date: 12/30/2017 CLINICAL DATA:  History of breast cancer on chemotherapy. Altered mental status. EXAM: CT HEAD WITHOUT AND WITH CONTRAST TECHNIQUE: Contiguous axial images were obtained  from the base of the skull through the vertex without and with intravenous contrast CONTRAST:  175m ISOVUE-370 IOPAMIDOL (ISOVUE-370) INJECTION 76% COMPARISON:  None. FINDINGS: Brain: No mass lesion, intraparenchymal hemorrhage or extra-axial collection. No evidence of acute cortical infarct. Brain parenchyma and CSF-containing spaces are normal for age. No contrast-enhancing lesions. Vascular: No hyperdense vessel or unexpected calcification. Skull: Normal visualized skull base, calvarium and extracranial soft tissues. Sinuses/Orbits: Moderate right maxillary sinus mucosal thickening. Normal orbits. IMPRESSION: No acute intracranial abnormality or enhancing lesion. Electronically Signed   By: KUlyses JarredM.D.   On: 12/30/2017 17:28   Ct Angio Chest Pe W Or Wo Contrast  Result Date: 12/30/2017 CLINICAL DATA:  Breast cancer, status post bilateral mastectomy, on adjuvant chemotherapy. Admitted hospital with sepsis secondary to influenza. EXAM: CT ANGIOGRAPHY CHEST WITH CONTRAST TECHNIQUE: Multidetector CT imaging of the chest was performed using the standard protocol during bolus administration of intravenous contrast. Multiplanar CT image reconstructions and MIPs were obtained to evaluate the vascular anatomy. CONTRAST:  1078mISOVUE-370 IOPAMIDOL (ISOVUE-370) INJECTION 76% COMPARISON:  Chest radiographs dated 12/30/2017. CT chest dated 12/28/2017. FINDINGS: Cardiovascular: Satisfactory opacification of the bilateral pulmonary arteries to the segmental level. No evidence of pulmonary embolism. The heart is normal in size.  No pericardial effusion. No evidence of thoracic aortic aneurysm or dissection. Right chest port terminates at the cavoatrial junction. Mediastinum/Nodes: Small mediastinal lymph nodes, including an 8 mm short axis low right paratracheal node. No suspicious hilar lymphadenopathy. Status post right axillary lymph node dissection. Lungs/Pleura: Focal/patchy opacities in the medial bilateral  lower lobes (series 6/image 84), suspicious for pneumonia, less likely atelectasis. Tree-in-bud nodular opacities in the right upper lobe (series 6/image 59, compatible with multifocal pneumonia. Additional focal/patchy opacity in the lateral right middle lobe (series 6/image 72), similar to recent CT, possibly reflecting an additional site of infection. However, underlying tumor is not excluded. Trace bilateral pleural effusions. No pneumothorax. Upper Abdomen: Visualized upper abdomen is grossly unremarkable. Musculoskeletal: Status post bilateral mastectomy. Stable tiny sclerotic lesions in the sternum and T4 vertebral body. Review of the MIP images confirms the above findings. IMPRESSION: No evidence of pulmonary embolism. Multifocal/patchy opacities in the right upper lobe, right middle lobe, and bilateral lower lobes, suspicious for multifocal pneumonia. Possible superimposed bilateral lower lobe atelectasis. Trace bilateral pleural effusions. Focal patchy/nodular opacity in the lateral right middle lobe, unchanged from recent CT, possibly related to the patient's multifocal infection. However, underlying tumor is not excluded. Consider follow-up CT chest in 4-6 weeks. Status post bilateral mastectomy with right axillary lymph node dissection. Electronically Signed   By: SrJulian Hy.D.   On: 12/30/2017 17:33   Ct Abdomen Pelvis W Contrast  Result Date: 12/29/2017 CLINICAL DATA:  Abdominal pain, history of RIGHT breast cancer stage IB (ER/PR positive, HER2 Neu positive), sinus tachycardia, development of fever, headache and coughing last week, husband had the flu, generalized aches, nausea, vomiting and diarrhea EXAM: CT ABDOMEN AND PELVIS WITH CONTRAST TECHNIQUE: Multidetector CT imaging of the abdomen and pelvis was performed using  the standard protocol following bolus administration of intravenous contrast. Sagittal and coronal MPR images reconstructed from axial data set. CONTRAST:  139m  ISOVUE-300 IOPAMIDOL (ISOVUE-300) INJECTION 61% IV. Patient refused oral contrast. COMPARISON:  09/05/2017 FINDINGS: Lower chest: Minimal bibasilar atelectasis Hepatobiliary: Tiny questionable low-attenuation focus RIGHT lobe liver image 9. Gallbladder and liver otherwise normal appearance Pancreas: Normal appearance Spleen: Normal appearance Adrenals/Urinary Tract: Small LEFT renal cyst. Adrenal glands, kidneys, ureters, and bladder otherwise normal appearance Stomach/Bowel: Normal appendix. Stomach and bowel loops normal appearance for technique Vascular/Lymphatic: Vascular structures unremarkable. No adenopathy. Reproductive: Uterus and adnexa normal appearance Other: No free air or free fluid. No acute inflammatory process identified. 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 IMPRESSION: No definite acute intra-abdominal or intrapelvic abnormalities. 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 Electronically Signed   By: MLavonia DanaM.D.   On: 12/29/2017 13:04   Dg Chest Port 1 View  Result Date: 12/30/2017 CLINICAL DATA:  42year old with current history of metastatic breast cancer, presenting with shortness of breath while on chemotherapy. EXAM: PORTABLE CHEST 1 VIEW COMPARISON:  CT chest 12/28/2017, 07/02/2017. Chest x-rays 12/28/2017, 09/23/2017 and earlier. FINDINGS: Cardiac silhouette normal in size for AP portable technique, unchanged. Calcified right hilar lymph nodes and the mass in the right middle lobe are better seen on the recent prior CT. Hazy opacities involving the medial right lung base, not present on the prior examinations. No new pulmonary parenchymal abnormalities elsewhere in either lung. Normal pulmonary vascularity. No pneumothorax. Right jugular Port-A-Cath tip projects over the lower SVC, unchanged. IMPRESSION: 1. Likely developing pneumonia involving the medial right  lung base. 2. The previously identified right middle lobe lung mass and the calcified right hilar lymph nodes are better seen on the recent CT. Electronically Signed   By: TEvangeline DakinM.D.   On: 12/30/2017 14:17        Scheduled Meds: . benzonatate  100 mg Oral TID  . cholecalciferol  2,000 Units Oral Daily  . fluticasone  1 spray Each Nare Daily  . heparin  5,000 Units Subcutaneous Q8H  . mouth rinse  15 mL Mouth Rinse BID  . mirtazapine  30 mg Oral QHS  . oseltamivir  75 mg Oral BID   Continuous Infusions: . sodium chloride 125 mL/hr at 12/31/17 0600  . piperacillin-tazobactam (ZOSYN)  IV Stopped (12/31/17 0715)     LOS: 3 days    Time spent: over 367min    CFayrene Helper MD [facility] Pager [phone] If 7PM-7AM, please contact night-coverage [email or web address] Password [email or web address]2/13/2019, 7:52 AM

## 2017-12-31 NOTE — Progress Notes (Signed)
Pharmacy Antibiotic Note  Sheila [name] is a 42 y.o. female admitted on 12/28/2017 with sepsis possibly due to influenza, gastroenteritis or chemo-related enteritis. Vancomycin 1gm IV and Zosyn 3.375gm IV x 1 dose ordered in ED.  Upon admission,  Pharmacy has been consulted for Zosyn dosing with vancomycin added by ID for worsening PNA in setting of influenza.   Today, 12/31/2017 Day #4 Zosyn Tmax 99.4 WBC 7.4 SCr stable 0.85, CrC. ~95 ml/min Cultures neg to date, + Influenza 2/10 started on Tamiflu MRSA PCR negative  Plan:  Vancomycin 1500mg  IV x 1 then 750mg  IV q12h  Zosyn 3.375g IV q8h (4 hour infusion).   Daily SCr for increased risk of nephrotoxicity of above regimen  Check vancomycin levels if remains on vancomycin > 3-4 days  MRSA PCR neg, sputum cx sent today - f/u results   Height: 5\' 6"  (167.6 cm) Weight: 172 lb 2.9 oz (78.1 kg) IBW/kg (Calculated) : 59.3  Temp (24hrs), Avg:99.8 F (37.7 C), Min:98.7 F (37.1 C), Max:102.7 F (39.3 C)  Recent Labs  Lab 12/26/17 2114 12/26/17 2141 12/28/17 1053 12/28/17 1239 12/28/17 1438 12/29/17 0350 12/29/17 1429 12/30/17 0428 12/31/17 0543  WBC 4.8  --  6.6  --   --  5.0  --  5.6 7.4  CREATININE 0.94  --  1.19*  --   --  0.96  --  0.99 0.85  LATICACIDVEN  --  1.04  --  0.37* 2.2*  --  0.9  --   --     Estimated Creatinine Clearance: 93.7 mL/min (by C-G formula based on SCr of 0.85 mg/dL).    No Known Allergies  Antimicrobials this admission:  2/10 zosyn >> 2/10 Tamiflu 75mg  BID x 5 days    Microbiology results:  2/10 BCx (portacath): ngtd 2/10 BCx (peripheral): ngtd 2/10 UCx: NGF 2/10 Influenza:  + Influenza A 2/10 Resp panel: Flu A 2/11 GI Panel: neg 2/11 C.diff: neg 2/12 MRSA PCR: neg 2/13 SPutum Cx:   Thank you for allowing pharmacy to be a part of this patient's care.  Doreene Eland, PharmD, BCPS.   Pager: [phone] 12/31/2017 5:44 PM

## 2017-12-31 NOTE — Progress Notes (Signed)
Pharmacy Antibiotic Note  Sheila [name] is a 43 y.o. female admitted on 12/28/2017 with sepsis possibly due to influenza, gastroenteritis or chemo-related enteritis. Vancomycin 1gm IV and Zosyn 3.375gm IV x 1 dose ordered in ED.  Upon admission,  Pharmacy has been consulted for Zosyn dosing.  Today, 12/31/2017 Day #4 Zosyn Tmax 99.4 WBC 7.4 SCr stable 0.85, CrC. ~95 ml/min Cultures neg to date, + Influenza 2/10 started on Tamiflu  Plan: Zosyn 3.375g IV q8h (4 hour infusion).  No further dosing adjustments needed Pharmacy will sign of note writing but will continue to follow with CCM  Height: 5\' 6"  (167.6 cm) Weight: 172 lb 2.9 oz (78.1 kg) IBW/kg (Calculated) : 59.3  Temp (24hrs), Avg:100.6 F (38.1 C), Min:98.7 F (37.1 C), Max:103 F (39.4 C)  Recent Labs  Lab 12/26/17 2114 12/26/17 2141 12/28/17 1053 12/28/17 1239 12/28/17 1438 12/29/17 0350 12/29/17 1429 12/30/17 0428 12/31/17 0543  WBC 4.8  --  6.6  --   --  5.0  --  5.6 7.4  CREATININE 0.94  --  1.19*  --   --  0.96  --  0.99 0.85  LATICACIDVEN  --  1.04  --  0.37* 2.2*  --  0.9  --   --     Estimated Creatinine Clearance: 93.7 mL/min (by C-G formula based on SCr of 0.85 mg/dL).    No Known Allergies  Antimicrobials this admission:  2/10 zosyn >> 2/10 Tamiflu 75mg  BID x 5 days    Microbiology results:  2/10 BCx (portacath): ngtd 2/10 BCx (peripheral): ngtd 2/10 UCx: NGF 2/10 Influenza:  + Influenza A 2/10 Resp panel: Flu A 2/11 GI Panel: neg 2/11 C.diff: neg 2/12 MRSA PCR: neg  Thank you for allowing pharmacy to be a part of this patient's care.  Peggyann Juba, PharmD, BCPS Pager: [phone] 12/31/2017 8:42 AM

## 2017-12-31 NOTE — Consult Note (Signed)
[facility] Infectious Disease    Date of Admission:  12/28/2017           Day 4 oseltamivir        Day 4 piperacillin tazobactam       Reason for Consult: Influenza A with superimposed pneumonia and sepsis    Referring Provider: Dr. Fayrene Helper  Assessment: Although she is feeling better today her recent clinical course and repeat CT scan show worsening pneumonia.  She is at risk for MRSA and resistant gram-negative rods given recent hospitalization and IV antibiotic therapies and ongoing sepsis.  I agree with continuing current therapy and restarting vancomycin.  I will follow-up tomorrow afternoon.  Plan: 1. Continue oseltamivir and piperacillin tazobactam 2. Restart vancomycin 3. Sputum for Gram stain and culture  Principal Problem:   Multifocal pneumonia Active Problems:   Sepsis ([place])   Influenza A   Malignant neoplasm of upper-outer quadrant of right breast in female, estrogen receptor positive (HCC)   Lactic acidosis   Lung mass   Diarrhea   Sinus tachycardia   Fever due to infection   Hypoxia   Scheduled Meds: . benzonatate  100 mg Oral TID  . Chlorhexidine Gluconate Cloth  6 each Topical Daily  . cholecalciferol  2,000 Units Oral Daily  . fluticasone  1 spray Each Nare Daily  . heparin  5,000 Units Subcutaneous Q8H  . mouth rinse  15 mL Mouth Rinse BID  . mirtazapine  30 mg Oral QHS  . oseltamivir  75 mg Oral BID   Continuous Infusions: . sodium chloride 125 mL/hr at 12/31/17 1542  . piperacillin-tazobactam (ZOSYN)  IV Stopped (12/31/17 1440)   PRN Meds:.acetaminophen **OR** acetaminophen, diphenoxylate-atropine, guaiFENesin-dextromethorphan, HYDROcodone-acetaminophen, ibuprofen, iopamidol, LORazepam, menthol-cetylpyridinium, ondansetron **OR** ondansetron (ZOFRAN) IV, phenol, prochlorperazine, sodium chloride flush  HPI: Sheila [name] is a 42 y.o. female who was diagnosed with right breast cancer last September.  She underwent  bilateral mastectomy and right axillary lymph node dissection.  She started on chemotherapy and Herceptin.  She was admitted to the hospital in October for febrile neutropenia.  Cultures were negative and she improved with empiric antibiotics and recovery of her white blood cell count.  She does not recall needing to be on any antibiotics since that time.  About 1 week ago she became very weak, developed nausea and worsening diarrhea.  She was seen in the emergency department on 12/26/2017.  She learned that her husband was diagnosed with influenza that same day.  She was treated with IV fluids and released.  She started having high fever and cough leading to admission on 12/28/2017.  She was found to be positive for influenza A.  Chest CT scan showed a masslike density in the right middle lobe and sclerotic lesions in her sternum and the body of T4.  She was started on oseltamivir and empiric piperacillin tazobactam.  She received 1 dose of vancomycin.  She continued to have fever.  Yesterday she was found slumped over in her chair and hypoxic.  She was transferred to the intensive care unit.  Repeat CT scan showed progression of bilateral infiltrates.  Blood cultures are negative.  She is feeling somewhat better today.   Review of Systems: Review of Systems  Constitutional: Positive for chills, fever and malaise/fatigue. Negative for diaphoresis and weight loss.  HENT: Positive for congestion and sore throat.   Respiratory: Positive for cough, sputum production and shortness of breath. Negative for hemoptysis.  Cardiovascular: Negative for chest pain.  Gastrointestinal: Positive for diarrhea and nausea. Negative for abdominal pain and vomiting.  Genitourinary: Negative for dysuria.  Musculoskeletal: Positive for myalgias.  Skin: Negative for rash.  Neurological: Negative for dizziness and headaches.    Past Medical History:  Diagnosis Date  . Anxiety   . Cancer [facility])    right breast   . Family  history of breast cancer   . Family history of colon cancer   . GERD (gastroesophageal reflux disease)   . Headache    couple of migraines in the past  . History of kidney stones   . Vitamin D deficiency     Social History   Tobacco Use  . Smoking status: Never Smoker  . Smokeless tobacco: Former Network engineer Use Topics  . Alcohol use: Yes    Comment: once a year  . Drug use: No    Family History  Problem Relation Age of Onset  . Breast cancer Paternal Grandmother 45  . Colon cancer Paternal Grandmother 36  . Breast cancer Mother 91  . Prostate cancer Paternal Grandfather        dx in his 2s  . Leukemia Paternal Grandfather   . Lung cancer Maternal Uncle        heavy smoker  . Other Paternal Uncle        farm accident  . Stroke Maternal Grandfather    No Known Allergies  OBJECTIVE: Blood pressure 117/65, pulse (!) 108, temperature 98.8 F (37.1 C), temperature source Oral, resp. rate 17, height 5\' 6"  (1.676 m), weight 172 lb 2.9 oz (78.1 kg), last menstrual period 08/27/2017, SpO2 99 %.  Physical Exam  Constitutional: She is oriented to person, place, and time.  She is sitting up in bed visiting with a friend.  She is in good spirits.  HENT:  Mouth/Throat: No oropharyngeal exudate.  Eyes: Conjunctivae are normal.  Neck: Neck supple.  Cardiovascular: Normal rate and regular rhythm.  No murmur heard. Pulmonary/Chest: Effort normal. She has no wheezes. She has rales.  Prominent crackles in posterior lung fields bilaterally.  Port-A-Cath site looks good  Abdominal: Soft. She exhibits no distension. There is no tenderness.  Musculoskeletal: Normal range of motion. She exhibits no edema or tenderness.  Neurological: She is alert and oriented to person, place, and time.  Skin: No rash noted.  Psychiatric: Mood and affect normal.    Lab Results Lab Results  Component Value Date   WBC 7.4 12/31/2017   HGB 7.5 (L) 12/31/2017   HCT 23.2 (L) 12/31/2017   MCV  99.1 12/31/2017   PLT 91 (L) 12/31/2017    Lab Results  Component Value Date   CREATININE 0.85 12/31/2017   BUN 5 (L) 12/31/2017   NA 141 12/31/2017   K 4.2 12/31/2017   CL 111 12/31/2017   CO2 20 (L) 12/31/2017    Lab Results  Component Value Date   ALT 17 12/28/2017   AST 22 12/28/2017   ALKPHOS 59 12/28/2017   BILITOT 0.4 12/28/2017     Microbiology: Recent Results (from the past 240 hour(s))  Urine culture     Status: None   Collection Time: 12/28/17  2:21 PM  Result Value Ref Range Status   Specimen Description   Final    URINE, RANDOM Performed at [facility], [place] [address], [place], [place] [address]    Special Requests   Final    NONE Performed at [facility],  [place] [address], [place], [place] [address]    Culture   Final    NO GROWTH Performed at [facility], [place] [address], [place], [place] [address]    Report Status 12/30/2017 FINAL  Final  Blood culture (routine x 2)     Status: None (Preliminary result)   Collection Time: 12/28/17  2:38 PM  Result Value Ref Range Status   Specimen Description   Final    BLOOD PORTA CATH Performed at [facility], [place] [address] [place], [place] [address]    Special Requests   Final    BOTTLES DRAWN AEROBIC AND ANAEROBIC Blood Culture adequate volume Performed at [facility] [address] [place], [place] [address]    Culture   Final    NO GROWTH 2 DAYS Performed at [facility] [address], [place], [place] [address]    Report Status PENDING  Incomplete  Blood culture (routine x 2)     Status: None (Preliminary result)   Collection Time: 12/28/17  2:38 PM  Result Value Ref Range Status   Specimen Description   Final    BLOOD LEFT ANTECUBITAL Performed at [facility], [place] [address] [place], [place] [address]    Special Requests   Final    BOTTLES DRAWN AEROBIC AND ANAEROBIC Blood Culture adequate volume Performed  at [facility] [address], [place], [place] [address]    Culture   Final    NO GROWTH 2 DAYS Performed at [facility] [address] [place], [place] [address]    Report Status PENDING  Incomplete  Respiratory Panel by PCR     Status: Abnormal   Collection Time: 12/28/17  4:32 PM  Result Value Ref Range Status   Adenovirus NOT DETECTED NOT DETECTED Final   Coronavirus 229E NOT DETECTED NOT DETECTED Final   Coronavirus HKU1 NOT DETECTED NOT DETECTED Final   Coronavirus NL63 NOT DETECTED NOT DETECTED Final   Coronavirus OC43 NOT DETECTED NOT DETECTED Final   Metapneumovirus NOT DETECTED NOT DETECTED Final   Rhinovirus / Enterovirus NOT DETECTED NOT DETECTED Final   Influenza A NOT DETECTED NOT DETECTED Final   Influenza A H1 NOT DETECTED NOT DETECTED Final   Influenza A H1 2009 DETECTED (A) NOT DETECTED Final   Influenza A H3 NOT DETECTED NOT DETECTED Final   Influenza B NOT DETECTED NOT DETECTED Final   Parainfluenza Virus 1 NOT DETECTED NOT DETECTED Final   Parainfluenza Virus 2 NOT DETECTED NOT DETECTED Final   Parainfluenza Virus 3 NOT DETECTED NOT DETECTED Final   Parainfluenza Virus 4 NOT DETECTED NOT DETECTED Final   Respiratory Syncytial Virus NOT DETECTED NOT DETECTED Final   Bordetella pertussis NOT DETECTED NOT DETECTED Final   Chlamydophila pneumoniae NOT DETECTED NOT DETECTED Final   Mycoplasma pneumoniae NOT DETECTED NOT DETECTED Final    Comment: Performed at [facility], [place]. [address] [place], [place] [address]  C difficile quick scan w PCR reflex     Status: None   Collection Time: 12/29/17  5:38 AM  Result Value Ref Range Status   C Diff antigen NEGATIVE NEGATIVE Final   C Diff toxin NEGATIVE NEGATIVE Final   C Diff interpretation No C. difficile detected.  Final    Comment: Performed at [facility], [place] [address], [place], [place] [address]  Gastrointestinal Panel by PCR , Stool     Status:  None    Collection Time: 12/29/17  5:38 AM  Result Value Ref Range Status   Campylobacter species NOT DETECTED NOT DETECTED Final   Plesimonas shigelloides NOT DETECTED NOT DETECTED Final   Salmonella species NOT DETECTED NOT DETECTED Final   Yersinia enterocolitica NOT DETECTED NOT DETECTED Final   Vibrio species NOT DETECTED NOT DETECTED Final   Vibrio cholerae NOT DETECTED NOT DETECTED Final   Enteroaggregative E coli (EAEC) NOT DETECTED NOT DETECTED Final   Enteropathogenic E coli (EPEC) NOT DETECTED NOT DETECTED Final   Enterotoxigenic E coli (ETEC) NOT DETECTED NOT DETECTED Final   Shiga like toxin producing E coli (STEC) NOT DETECTED NOT DETECTED Final   E. coli O157 NOT DETECTED NOT DETECTED Final   Shigella/Enteroinvasive E coli (EIEC) NOT DETECTED NOT DETECTED Final   Cryptosporidium NOT DETECTED NOT DETECTED Final   Cyclospora cayetanensis NOT DETECTED NOT DETECTED Final   Entamoeba histolytica NOT DETECTED NOT DETECTED Final   Giardia lamblia NOT DETECTED NOT DETECTED Final   Adenovirus F40/41 NOT DETECTED NOT DETECTED Final   Astrovirus NOT DETECTED NOT DETECTED Final   Norovirus GI/GII NOT DETECTED NOT DETECTED Final   Rotavirus A NOT DETECTED NOT DETECTED Final   Sapovirus (I, II, IV, and V) NOT DETECTED NOT DETECTED Final    Comment: Performed at [facility], [place] [place], [place] [address]  MRSA PCR Screening     Status: None   Collection Time: 12/30/17 12:00 PM  Result Value Ref Range Status   MRSA by PCR NEGATIVE NEGATIVE Final    Comment:        The GeneXpert MRSA Assay (FDA approved for NASAL specimens only), is one component of a comprehensive MRSA colonization surveillance program. It is not intended to diagnose MRSA infection nor to guide or monitor treatment for MRSA infections. Performed at [facility], [place] [address], [place], [place] [address]     Michel Bickers, [place] [facility] [phone] pager   [phone] cell 12/31/2017, 5:18 PM

## 2017-12-31 NOTE — Care Management Note (Signed)
Case Management Note  Patient Details  Name: Sheila [name] MRN: [id number] Date of Birth: 09/04/1976  Subjective/Objective:                  Hypoxia and hypotension  Action/Plan: Date: December 31, 2017 Velva Harman, BSN, [place], [place] Chart and notes review for patient progress and needs. Will follow for case management and discharge needs. No cm or discharge needs present at time of this review. Next review date: [id number]  Expected Discharge Date:                  Expected Discharge Plan:  Home/Self Care  In-House Referral:     Discharge planning Services  CM Consult  Post Acute Care Choice:    Choice offered to:     DME Arranged:    DME Agency:     HH Arranged:    HH Agency:     Status of Service:  In process, will continue to follow  If discussed at Long Length of Stay Meetings, dates discussed:    Additional Comments:  Leeroy Cha, RN 12/31/2017, 8:27 AM

## 2018-01-01 DIAGNOSIS — J189 Pneumonia, unspecified organism: Secondary | ICD-10-CM

## 2018-01-01 DIAGNOSIS — J1100 Influenza due to unidentified influenza virus with unspecified type of pneumonia: Secondary | ICD-10-CM

## 2018-01-01 LAB — BASIC METABOLIC PANEL
ANION GAP: 8 (ref 5–15)
BUN: <5 mg/dL — ABNORMAL LOW (ref 6–20)
CO2: 22 mmol/L (ref 22–32)
Calcium: 7.7 mg/dL — ABNORMAL LOW (ref 8.9–10.3)
Chloride: 115 mmol/L — ABNORMAL HIGH (ref 101–111)
Creatinine, Ser: 0.9 mg/dL (ref 0.44–1.00)
GFR calc Af Amer: >60 mL/min (ref >60)
Glucose, Bld: 153 mg/dL — ABNORMAL HIGH (ref 65–99)
POTASSIUM: 2.8 mmol/L — AB (ref 3.5–5.1)
SODIUM: 145 mmol/L (ref 135–145)

## 2018-01-01 LAB — CBC
HCT: 23.2 % — ABNORMAL LOW (ref 36.0–46.0)
Hemoglobin: 7.6 g/dL — ABNORMAL LOW (ref 12.0–15.0)
MCH: 31.5 pg (ref 26.0–34.0)
MCHC: 32.8 g/dL (ref 30.0–36.0)
MCV: 96.3 fL (ref 78.0–100.0)
PLATELETS: 77 10*3/uL — AB (ref 150–400)
RBC: 2.41 MIL/uL — AB (ref 3.87–5.11)
RDW: 19.6 % — ABNORMAL HIGH (ref 11.5–15.5)
WBC: 5.7 10*3/uL (ref 4.0–10.5)

## 2018-01-01 LAB — TRANSFUSION REACTION
DAT C3: NEGATIVE
Post RXN DAT IgG: NEGATIVE

## 2018-01-01 LAB — CBC WITH DIFFERENTIAL/PLATELET
BASOS ABS: 0 10*3/uL (ref 0.0–0.1)
Basophils Relative: 0 %
Eosinophils Absolute: 0 10*3/uL (ref 0.0–0.7)
Eosinophils Relative: 1 %
HCT: 24.5 % — ABNORMAL LOW (ref 36.0–46.0)
Hemoglobin: 7.9 g/dL — ABNORMAL LOW (ref 12.0–15.0)
LYMPHS PCT: 21 %
Lymphs Abs: 1.3 10*3/uL (ref 0.7–4.0)
MCH: 31.3 pg (ref 26.0–34.0)
MCHC: 32.2 g/dL (ref 30.0–36.0)
MCV: 97.2 fL (ref 78.0–100.0)
Monocytes Absolute: 0.3 10*3/uL (ref 0.1–1.0)
Monocytes Relative: 4 %
NEUTROS ABS: 4.6 10*3/uL (ref 1.7–7.7)
Neutrophils Relative %: 74 %
Platelets: 84 10*3/uL — ABNORMAL LOW (ref 150–400)
RBC: 2.52 MIL/uL — ABNORMAL LOW (ref 3.87–5.11)
RDW: 19.8 % — AB (ref 11.5–15.5)
WBC: 6.3 10*3/uL (ref 4.0–10.5)

## 2018-01-01 LAB — ALBUMIN: ALBUMIN: 2.4 g/dL — AB (ref 3.5–5.0)

## 2018-01-01 LAB — MAGNESIUM: MAGNESIUM: 1.5 mg/dL — AB (ref 1.7–2.4)

## 2018-01-01 LAB — PREPARE RBC (CROSSMATCH)

## 2018-01-01 MED ORDER — POTASSIUM CHLORIDE CRYS ER 20 MEQ PO TBCR: 40.0000 meq | EXTENDED_RELEASE_TABLET | ORAL | Status: DC

## 2018-01-01 MED ORDER — ACETAMINOPHEN 325 MG PO TABS
650.0000 mg | ORAL_TABLET | Freq: Once | ORAL | Status: AC
  Administered 2018-01-01: 650 mg via ORAL
  Filled 2018-01-01: qty 2

## 2018-01-01 MED ORDER — LACTATED RINGERS IV SOLN
INTRAVENOUS | Status: DC
  Administered 2018-01-01 – 2018-01-03 (×4): via INTRAVENOUS

## 2018-01-01 MED ORDER — SODIUM CHLORIDE 0.9 % IV SOLN: Freq: Once | INTRAVENOUS | Status: DC

## 2018-01-01 MED ORDER — MAGNESIUM SULFATE 4 GM/100ML IV SOLN
4.0000 g | Freq: Once | INTRAVENOUS | Status: AC
  Administered 2018-01-01: 4 g via INTRAVENOUS
  Filled 2018-01-01: qty 100

## 2018-01-01 MED ORDER — DIPHENHYDRAMINE HCL 25 MG PO CAPS
25.0000 mg | ORAL_CAPSULE | Freq: Once | ORAL | Status: AC
  Administered 2018-01-01: 25 mg via ORAL
  Filled 2018-01-01: qty 1

## 2018-01-01 MED ORDER — POTASSIUM CHLORIDE CRYS ER 20 MEQ PO TBCR
40.0000 meq | EXTENDED_RELEASE_TABLET | ORAL | Status: AC
  Filled 2018-01-01: qty 2

## 2018-01-01 MED ORDER — POTASSIUM CHLORIDE CRYS ER 10 MEQ PO TBCR
40.0000 meq | EXTENDED_RELEASE_TABLET | ORAL | Status: AC
  Administered 2018-01-01: 40 meq via ORAL
  Filled 2018-01-01: qty 4

## 2018-01-01 MED ORDER — POTASSIUM CHLORIDE 10 MEQ/100ML IV SOLN
10.0000 meq | INTRAVENOUS | Status: AC
  Administered 2018-01-01 (×3): 10 meq via INTRAVENOUS
  Filled 2018-01-01 (×3): qty 100

## 2018-01-01 NOTE — Progress Notes (Addendum)
PROGRESS NOTE    Sheila [name]  OYD:[id number] DOB: 1975-12-08 DOA: 12/28/2017 PCP: [name], MD   Brief Narrative:  42 year old female with history of breast cancer (oncologist is Dr. Burr Medico), ER, PR positive, H ER 2+, status post bilateral mastectomies and on adjuvant chemotherapy with TCHP last 2 weeks ago, sinus tachycardia being evaluated by cardiology as an outpatient, presented to the ED with flulike symptoms.  Her husband was recently hospitalized with influenza, she was diagnosed with the same and was admitted to the hospital with sepsis due to influenza.  Assessment & Plan:   Principal Problem:   Multifocal pneumonia Active Problems:   Malignant neoplasm of upper-outer quadrant of right breast in female, estrogen receptor positive (HCC)   Sepsis ([place])   Lactic acidosis   Lung mass   Diarrhea   Sinus tachycardia   Influenza Choice [place]   Fever due to infection   Hypoxia  Sepsis (HCC)secondary to influenza Sheila [name]  Acute Hypoxic Respiratory Failure 2/2 multifocal pneumonia - CT of chest 2/12 with "multifocal/patchy opacities in RUL, RML, and bilateral lower lobes, suspicious for multifocal pneumonia" (see report - f/u imaging recommended) -Patient metsepsiscriteria at the time of admission with fever, tachycardia, lactic acidosis, dehydration, possibly due to influenza versus colitis.  -Influenza Sheila [name] was positive -Continue Tamiflu, last fever looks to be 2/13 ~2300, placed on Zosyn empirically for concern for intraabdominal infection at presentation with N/V/D, but notable CT abdomen pelvis on 2/11 without definite intra-abdominal or intrapelvic abnormality.  Will continue zosyn for now with developing multifocal pneumonia - Vancomycin added (2/13 - ) for presumed superimposed bacterial pneumonia - sputum cx pending, blood cx NGTD - Consulted ID for assistance given worsening clinical status   Hypotension: improved, but systolics overnight dipped down again (although sleeping  at these times per nursing).  Suspect due to sepsis and hypovolemia.  Continue to monitor.  - MAP goal 65, will continue to monitor  Anemia: Transfusion on hold due to fever during transfusion yesterday.  UA without Hb.  Transfusion reaction panel pending.  Plan to restart today if negative.   Thrombocytopenia: 2/2 sepsis, malignancy, chemo.  Continue to monitor.  As low as 81 on 2/1.  Hold heparin for now.  Hypocalcemia: Improved today.  Hypokalemia  Hypomagnesemia: replete and follow  Urinary retention: with >1 L in bladder yesterday.  Foley in place.  Will plan for trial of void tomorrow.   NAGMA: ctm, switch to LR  Syncopal episode -Patient had Sheila [name] syncopal episode and passed out while standing up 2/12 in the hospital room.  Pt reports to me that she was using the bathroom at this time.  Nurse tech was at bedside and guided patient to the floor, CODE BLUE was called.  There are no reports that patient lost pulse. She has little recollection of these events.  She was transferred back to bed, where she returned to normal.  She was evaluated at bedside, and was alert and oriented x4, vital signs were stable however, but she had new oxygen requirement.  EKG non ischemic.  CTA negative for PE, but with multifocal pneumonia as above.  Head CT with and without contrast without acute intracranial abnormality or enhancing lesion.  Suspect this was vasovagal or secondary to orthostasis in setting of sepsis or increased vagal tone when using bathroom (but with low BP's suspect sepsis, hypovolemia, and orthostasis may be more likely).   Nausea, vomiting, diarrhea - improved -C. difficile negative and started on Lomotil -likely due to influenza, continue  supportive management, continue IV fluids  Lung mass,newmetastasis onCT chest inthe setting of known history of breast cancer -CT chest showed 2.3x2.7x1.2 cm irregular mass like opacity in the right middle lobe worrisome for  malignancy/metastasis, 3 new small sclerotic lesions in the sternum and body of T4 -CT abdomen pelvis without acute findings -Oncology consulted  Sinus tachycardia -Likely worsened due to #1 (holding coreg for now with soft BP's and syncope 2/12) -2D echo 2/1 showed EF of 60%, no regional wall motion abnormalities.  Notable for GLS -15%. -TSH 0.692, follows Dr. Aundra Dubin  DVT prophylaxis: SCD's, holding heparin with thrombocytopenia Code Status: full  Family Communication: none at bedside Disposition Plan: pending   Consultants:   Oncology  ID  Procedures:   none  Antimicrobials:  Anti-infectives (From admission, onward)   Start     Dose/Rate Route Frequency Ordered Stop   01/01/18 1000  vancomycin (VANCOCIN) IVPB 750 mg/150 ml premix     750 mg 150 mL/hr over 60 Minutes Intravenous Every 12 hours 12/31/17 1745     12/31/17 1930  vancomycin (VANCOCIN) 1,500 mg in sodium chloride 0.9 % 500 mL IVPB     1,500 mg 250 mL/hr over 120 Minutes Intravenous  Once 12/31/17 1745 12/31/17 2200   12/28/17 1800  piperacillin-tazobactam (ZOSYN) IVPB 3.375 g     3.375 g 12.5 mL/hr over 240 Minutes Intravenous Every 8 hours 12/28/17 1745     12/28/17 1630  oseltamivir (TAMIFLU) capsule 75 mg     75 mg Oral 2 times daily 12/28/17 1613 01/02/18 2159   12/28/17 1530  vancomycin (VANCOCIN) IVPB 1000 mg/200 mL premix     1,000 mg 200 mL/hr over 60 Minutes Intravenous  Once 12/28/17 1529 12/28/17 1849   12/28/17 1530  piperacillin-tazobactam (ZOSYN) IVPB 3.375 g  Status:  Discontinued     3.375 g 100 mL/hr over 30 Minutes Intravenous  Once 12/28/17 1529 12/28/17 1744         Subjective: Feeling Sheila [name] little bit better. Still some SOB. Fatigue.   Objective: Vitals:   01/01/18 0600 01/01/18 0700 01/01/18 0707 01/01/18 0728  BP: (!) 91/35 (!) 87/35  (!) 105/54  Pulse:      Resp: _0 Temp:   99.3 F (37.4 C)   TempSrc:   Oral   SpO2: 92% 91%  93%  Weight:      Height:         Intake/Output Summary (Last 24 hours) at 01/01/2018 0742 Last data filed at 01/01/2018 0600 Gross per 24 hour  Intake 2323.33 ml  Output 2330 ml  Net -6.67 ml   Filed Weights   12/28/17 1051 12/28/17 1655 12/30/17 1200  Weight: 77.1 kg (170 lb) 78.8 kg (173 lb 12.8 oz) 78.1 kg (172 lb 2.9 oz)    Examination:  General: No acute distress. Cardiovascular: Heart sounds show Sheila [name] regular rate, and rhythm. No gallops or rubs. No murmurs. No JVD. Lungs: Diffuse crackles at bases, no increased WOB.  On RA this morning.  Abdomen: Soft, nontender, nondistended with normal active bowel sounds. No masses. No hepatosplenomegaly. Neurological: Alert and oriented 3. Moves all extremities 4 with equal strength. Cranial nerves II through XII grossly intact. Skin: Warm and dry. No rashes or lesions. Extremities: No clubbing or cyanosis. No edema.  Psychiatric: Mood and affect are normal. Insight and judgment are appropriate.  Data Reviewed: I have personally reviewed following labs and imaging studies  CBC: Recent Labs  Lab 12/28/17 1053 12/29/17 0350  12/30/17 0428 12/31/17 0543 12/31/17 2100 01/01/18 0606  WBC 6.6 5.0 5.6 7.4 6.3 5.7  NEUTROABS 5.3  --   --   --  4.6  --   HGB 8.6* 7.9* 7.2* 7.5* 7.9* 7.6*  HCT 26.5* 25.2* 22.8* 23.2* 24.5* 23.2*  MCV 98.1 99.6 100.0 99.1 97.2 96.3  PLT 124* 107* 101* 91* 84* 77*   Basic Metabolic Panel: Recent Labs  Lab 12/26/17 2114 12/28/17 1053 12/29/17 0350 12/30/17 0428 12/31/17 0543 01/01/18 0606  NA 140 135 143 142 141 145  K 3.2* 3.9 3.5 3.3* 4.2 2.8*  CL 105 104 111 111 111 115*  CO2 _0 20* 22  GLUCOSE 104* 113* 101* 121* 103* 153*  BUN 5* 9 6 <5* 5* <5*  CREATININE 0.94 1.19* 0.96 0.99 0.85 0.90  CALCIUM 8.9 8.6* 8.1* 7.8* 6.1* 7.7*  MG 1.9  --   --   --   --  1.5*   GFR: Estimated Creatinine Clearance: 88.5 mL/min (by C-G formula based on SCr of 0.9 mg/dL). Liver Function Tests: Recent Labs  Lab 12/26/17 2114  12/28/17 1053 12/31/17 0543 01/01/18 0606  AST 23 22  --   --   ALT 21 17  --   --   ALKPHOS 80 59  --   --   BILITOT 0.2* 0.4  --   --   PROT 6.5 6.1*  --   --   ALBUMIN 3.7 3.4* 2.3* 2.4*   Recent Labs  Lab 12/26/17 2114  LIPASE 31   No results for input(s): AMMONIA in the last 168 hours. Coagulation Profile: No results for input(s): INR, PROTIME in the last 168 hours. Cardiac Enzymes: No results for input(s): CKTOTAL, CKMB, CKMBINDEX, TROPONINI in the last 168 hours. BNP (last 3 results) No results for input(s): PROBNP in the last 8760 hours. HbA1C: No results for input(s): HGBA1C in the last 72 hours. CBG: Recent Labs  Lab 12/30/17 1125  GLUCAP 148*   Lipid Profile: No results for input(s): CHOL, HDL, LDLCALC, TRIG, CHOLHDL, LDLDIRECT in the last 72 hours. Thyroid Function Tests: No results for input(s): TSH, T4TOTAL, FREET4, T3FREE, THYROIDAB in the last 72 hours. Anemia Panel: No results for input(s): VITAMINB12, FOLATE, FERRITIN, TIBC, IRON, RETICCTPCT in the last 72 hours. Sepsis Labs: Recent Labs  Lab 12/26/17 2141 12/28/17 1239 12/28/17 1438 12/28/17 1631 12/29/17 1429  PROCALCITON  --   --   --  <0.10  --   LATICACIDVEN 1.04 0.37* 2.2*  --  0.9    Recent Results (from the past 240 hour(s))  Urine culture     Status: None   Collection Time: 12/28/17  2:21 PM  Result Value Ref Range Status   Specimen Description   Final    URINE, RANDOM Performed at [facility], [place] [address], [place], [place] [address]    Special Requests   Final    NONE Performed at [facility], [place] [address], [place], [place] [address]    Culture   Final    NO GROWTH Performed at [facility], [place] [address] [place], [place] [address]    Report Status 12/30/2017 FINAL  Final  Blood culture (routine x 2)     Status: None (Preliminary result)   Collection Time: 12/28/17  2:38 PM  Result Value Ref Range Status   Specimen  Description   Final    BLOOD PORTA CATH Performed at [facility], [place]  [address] [place], [place] [address]    Special Requests   Final    BOTTLES DRAWN AEROBIC AND ANAEROBIC Blood Culture adequate volume Performed at [place] [address] [place], [place] [address]    Culture   Final    NO GROWTH 2 DAYS Performed at [place] [address], [place], [place] [address]    Report Status PENDING  Incomplete  Blood culture (routine x 2)     Status: None (Preliminary result)   Collection Time: 12/28/17  2:38 PM  Result Value Ref Range Status   Specimen Description   Final    BLOOD LEFT ANTECUBITAL Performed at [facility], [place] [address], [place], [place] [address]    Special Requests   Final    BOTTLES DRAWN AEROBIC AND ANAEROBIC Blood Culture adequate volume Performed at [place] [address], [place], [place] [address]    Culture   Final    NO GROWTH 2 DAYS Performed at [place] [address] [place], [place] [address]    Report Status PENDING  Incomplete  Respiratory Panel by PCR     Status: Abnormal   Collection Time: 12/28/17  4:32 PM  Result Value Ref Range Status   Adenovirus NOT DETECTED NOT DETECTED Final   Coronavirus 229E NOT DETECTED NOT DETECTED Final   Coronavirus HKU1 NOT DETECTED NOT DETECTED Final   Coronavirus NL63 NOT DETECTED NOT DETECTED Final   Coronavirus OC43 NOT DETECTED NOT DETECTED Final   Metapneumovirus NOT DETECTED NOT DETECTED Final   Rhinovirus / Enterovirus NOT DETECTED NOT DETECTED Final   Influenza Destry Dauber NOT DETECTED NOT DETECTED Final   Influenza Angell Honse H1 NOT DETECTED NOT DETECTED Final   Influenza Huber Mathers H1 2009 DETECTED (Izzy Doubek) NOT DETECTED Final   Influenza Peggie Hornak H3 NOT DETECTED NOT DETECTED Final   Influenza B NOT DETECTED NOT DETECTED Final   Parainfluenza Virus 1 NOT DETECTED NOT DETECTED Final   Parainfluenza Virus 2 NOT DETECTED NOT DETECTED Final    Parainfluenza Virus 3 NOT DETECTED NOT DETECTED Final   Parainfluenza Virus 4 NOT DETECTED NOT DETECTED Final   Respiratory Syncytial Virus NOT DETECTED NOT DETECTED Final   Bordetella pertussis NOT DETECTED NOT DETECTED Final   Chlamydophila pneumoniae NOT DETECTED NOT DETECTED Final   Mycoplasma pneumoniae NOT DETECTED NOT DETECTED Final    Comment: Performed at [facility], [place]. [address], [place], [place] [address]  C difficile quick scan w PCR reflex     Status: None   Collection Time: 12/29/17  5:38 AM  Result Value Ref Range Status   C Diff antigen NEGATIVE NEGATIVE Final   C Diff toxin NEGATIVE NEGATIVE Final   C Diff interpretation No C. difficile detected.  Final    Comment: Performed at [facility], [place] [address], [place], [place] [address]  Gastrointestinal Panel by PCR , Stool     Status: None   Collection Time: 12/29/17  5:38 AM  Result Value Ref Range Status   Campylobacter species NOT DETECTED NOT DETECTED Final   Plesimonas shigelloides NOT DETECTED NOT DETECTED Final   Salmonella species NOT DETECTED NOT DETECTED Final   Yersinia enterocolitica NOT DETECTED NOT DETECTED Final   Vibrio species NOT DETECTED NOT DETECTED Final   Vibrio cholerae NOT DETECTED NOT DETECTED Final   Enteroaggregative E coli (EAEC) NOT DETECTED NOT DETECTED Final   Enteropathogenic E coli (EPEC) NOT DETECTED NOT DETECTED  Final   Enterotoxigenic E coli (ETEC) NOT DETECTED NOT DETECTED Final   Shiga like toxin producing E coli (STEC) NOT DETECTED NOT DETECTED Final   E. coli O157 NOT DETECTED NOT DETECTED Final   Shigella/Enteroinvasive E coli (EIEC) NOT DETECTED NOT DETECTED Final   Cryptosporidium NOT DETECTED NOT DETECTED Final   Cyclospora cayetanensis NOT DETECTED NOT DETECTED Final   Entamoeba histolytica NOT DETECTED NOT DETECTED Final   Giardia lamblia NOT DETECTED NOT DETECTED Final   Adenovirus F40/41 NOT DETECTED NOT DETECTED Final   Astrovirus NOT  DETECTED NOT DETECTED Final   Norovirus GI/GII NOT DETECTED NOT DETECTED Final   Rotavirus Brittini Brubeck NOT DETECTED NOT DETECTED Final   Sapovirus (I, II, IV, and V) NOT DETECTED NOT DETECTED Final    Comment: Performed at [facility], [place]., [place], [place] [address]  MRSA PCR Screening     Status: None   Collection Time: 12/30/17 12:00 PM  Result Value Ref Range Status   MRSA by PCR NEGATIVE NEGATIVE Final    Comment:        The GeneXpert MRSA Assay (FDA approved for NASAL specimens only), is one component of Beverely Suen comprehensive MRSA colonization surveillance program. It is not intended to diagnose MRSA infection nor to guide or monitor treatment for MRSA infections. Performed at [facility], [place] [address], [place], [place] [address]          Radiology Studies: Ct Head W & Wo Contrast  Result Date: 12/30/2017 CLINICAL DATA:  History of breast cancer on chemotherapy. Altered mental status. EXAM: CT HEAD WITHOUT AND WITH CONTRAST TECHNIQUE: Contiguous axial images were obtained from the base of the skull through the vertex without and with intravenous contrast CONTRAST:  17m ISOVUE-370 IOPAMIDOL (ISOVUE-370) INJECTION 76% COMPARISON:  None. FINDINGS: Brain: No mass lesion, intraparenchymal hemorrhage or extra-axial collection. No evidence of acute cortical infarct. Brain parenchyma and CSF-containing spaces are normal for age. No contrast-enhancing lesions. Vascular: No hyperdense vessel or unexpected calcification. Skull: Normal visualized skull base, calvarium and extracranial soft tissues. Sinuses/Orbits: Moderate right maxillary sinus mucosal thickening. Normal orbits. IMPRESSION: No acute intracranial abnormality or enhancing lesion. Electronically Signed   By: KUlyses JarredM.D.   On: 12/30/2017 17:28   Ct Angio Chest Pe W Or Wo Contrast  Result Date: 12/30/2017 CLINICAL DATA:  Breast cancer, status post bilateral mastectomy, on adjuvant  chemotherapy. Admitted hospital with sepsis secondary to influenza. EXAM: CT ANGIOGRAPHY CHEST WITH CONTRAST TECHNIQUE: Multidetector CT imaging of the chest was performed using the standard protocol during bolus administration of intravenous contrast. Multiplanar CT image reconstructions and MIPs were obtained to evaluate the vascular anatomy. CONTRAST:  1035mISOVUE-370 IOPAMIDOL (ISOVUE-370) INJECTION 76% COMPARISON:  Chest radiographs dated 12/30/2017. CT chest dated 12/28/2017. FINDINGS: Cardiovascular: Satisfactory opacification of the bilateral pulmonary arteries to the segmental level. No evidence of pulmonary embolism. The heart is normal in size.  No pericardial effusion. No evidence of thoracic aortic aneurysm or dissection. Right chest port terminates at the cavoatrial junction. Mediastinum/Nodes: Small mediastinal lymph nodes, including an 8 mm short axis low right paratracheal node. No suspicious hilar lymphadenopathy. Status post right axillary lymph node dissection. Lungs/Pleura: Focal/patchy opacities in the medial bilateral lower lobes (series 6/image 84), suspicious for pneumonia, less likely atelectasis. Tree-in-bud nodular opacities in the right upper lobe (series 6/image 59, compatible with multifocal pneumonia. Additional focal/patchy opacity in the lateral right middle lobe (series 6/image 72), similar to recent CT, possibly reflecting an additional site  of infection. However, underlying tumor is not excluded. Trace bilateral pleural effusions. No pneumothorax. Upper Abdomen: Visualized upper abdomen is grossly unremarkable. Musculoskeletal: Status post bilateral mastectomy. Stable tiny sclerotic lesions in the sternum and T4 vertebral body. Review of the MIP images confirms the above findings. IMPRESSION: No evidence of pulmonary embolism. Multifocal/patchy opacities in the right upper lobe, right middle lobe, and bilateral lower lobes, suspicious for multifocal pneumonia. Possible  superimposed bilateral lower lobe atelectasis. Trace bilateral pleural effusions. Focal patchy/nodular opacity in the lateral right middle lobe, unchanged from recent CT, possibly related to the patient's multifocal infection. However, underlying tumor is not excluded. Consider follow-up CT chest in 4-6 weeks. Status post bilateral mastectomy with right axillary lymph node dissection. Electronically Signed   By: Julian Hy M.D.   On: 12/30/2017 17:33   Dg Chest Port 1 View  Result Date: 12/30/2017 CLINICAL DATA:  42 year old with current history of metastatic breast cancer, presenting with shortness of breath while on chemotherapy. EXAM: PORTABLE CHEST 1 VIEW COMPARISON:  CT chest 12/28/2017, 07/02/2017. Chest x-rays 12/28/2017, 09/23/2017 and earlier. FINDINGS: Cardiac silhouette normal in size for AP portable technique, unchanged. Calcified right hilar lymph nodes and the mass in the right middle lobe are better seen on the recent prior CT. Hazy opacities involving the medial right lung base, not present on the prior examinations. No new pulmonary parenchymal abnormalities elsewhere in either lung. Normal pulmonary vascularity. No pneumothorax. Right jugular Port-Javi Bollman-Cath tip projects over the lower SVC, unchanged. IMPRESSION: 1. Likely developing pneumonia involving the medial right lung base. 2. The previously identified right middle lobe lung mass and the calcified right hilar lymph nodes are better seen on the recent CT. Electronically Signed   By: Evangeline Dakin M.D.   On: 12/30/2017 14:17        Scheduled Meds: . benzonatate  100 mg Oral TID  . Chlorhexidine Gluconate Cloth  6 each Topical Daily  . cholecalciferol  2,000 Units Oral Daily  . fluticasone  1 spray Each Nare Daily  . heparin  5,000 Units Subcutaneous Q8H  . mouth rinse  15 mL Mouth Rinse BID  . mirtazapine  30 mg Oral QHS  . oseltamivir  75 mg Oral BID  . potassium chloride  40 mEq Oral Q4H   Continuous Infusions: .  sodium chloride 125 mL/hr at 12/31/17 1542  . magnesium sulfate 1 - 4 g bolus IVPB    . piperacillin-tazobactam (ZOSYN)  IV Stopped (01/01/18 0500)  . vancomycin       LOS: 4 days    Time spent: over 30 min    Fayrene Helper, MD [facility] Pager [phone]  If 7PM-7AM, please contact night-coverage [email or web address] Password [facility] 01/01/2018, 7:42 AM

## 2018-01-01 NOTE — Progress Notes (Incomplete Revision)
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    Receptor Status: ER(100 % +), PR (100 % +), Her2-neu (-  2.55 ratio), Ki-(12 %)  Did patient present with symptoms (if so, please note symptoms) or was this found on screening mammography?: She noted a palpable lump in the right breast as well as nipple and skin retraction. She underwent diagnostic mammogram  Past/Anticipated interventions by surgeon, if any:  2. FLUORESCENCE IN-SITU HYBRIDIZATION       HER2 - NEGATIVE  2. PROGNOSTIC INDICATORS Results: IMMUNOHISTOCHEMICAL AND MORPHOMETRIC ANALYSIS PERFORMED MANUALLY Estrogen Receptor: 95%, POSITIVE, STRONG STAINING INTENSITY Progesterone Receptor: 95%, POSITIVE, STRONG STAINING INTENSITY Proliferation Marker Ki67: 10% 1 of 4. FLUORESCENCE IN-SITU HYBRIDIZATION Results: HER2 - **POSITIVE  4. PROGNOSTIC INDICATORS Results: IMMUNOHISTOCHEMICAL AND MORPHOMETRIC ANALYSIS PERFORMED MANUALLY Estrogen Receptor: 95%, POSITIVE, STRONG STAINING INTENSITY Progesterone Receptor: 95%, POSITIVE, STRONG STAINING INTENSITY  Diagnosis 07-22-17 Dr. [name] 1. Breast, simple mastectomy, Left - FIBROADENOMA. - NO MALIGNANCY IDENTIFIED. 2. 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 - ADDITIONAL FOCUS OF LOBULAR CARCINOMA, GRADE 2, SPANNING 2.2 CM. - INTERMEDIATE GRADE DUCTAL CARCINOMA IN SITU. - INVASIVE CARCINOMA COMES TO WITHIN 0.2 TO 0.3 CM OF THE DEEP MARGIN, FOCALLY. - TUMOR FOCALLY INVOLVES EPIDERMIS. - LYMPHOVASCULAR INVASION OF DERMAL LYMPHATICS. - ONE OF ONE LYMPH NODES NEGATIVE FOR CARCINOMA (0/1). - SEE ONCOLOGY TABLE. 3. Lymph node, sentinel, biopsy, Right axillary - ONE LYMPH NODE WITH ISOLATED TUMOR CELLS (0/1). 4. 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 5. Lymph node, sentinel, biopsy, Right - ONE OF ONE LYMPH NODES NEGATIVE FOR CARCINOMA (0/1). 6. Lymph node, sentinel, biopsy, Right - METASTATIC CARCINOMA IN ONE OF ONE LYMPH NODES (1/1). - EXTRACAPSULAR EXTENSION. 7. Lymph node, sentinel, biopsy, Right - METASTATIC CARCINOMA IN ONE OF ONE LYMPH NODES (1/1). - EXTRACAPSULAR EXTENSION. 8. Lymph node, sentinel, biopsy, Right - ONE OF ONE LYMPH NODES NEGATIVE FOR CARCINOMA (0/1). 9. Lymph node, sentinel, biopsy, Right - ONE OF ONE LYMPH NODES NEGATIVE FOR CARCINOMA (0/1). 10. Lymph node, sentinel, biopsy, Right - METASTATIC CARCINOMA IN ONE OF ONE LYMPH NODES (1/1). - EXTRACAPSULAR EXTENSION. 11. Lymph node, biopsy, Right axillary - ONE OF ONE LYMPH NODES NEGATIVE FOR CARCINOMA (0/1).  Receptor Status: ER(100 % +), PR (100 % +), Her2-neu (-  2.55 ratio), Ki-(12 %)   Past/Anticipated interventions by medical oncology, if any: Chemotherapy   08/29/2017 adjuvant TCHP every 3 weeks for 6 cycles, followed by maintenance Herceptin with or without Perjeta for 6-12 months. Held after cycle 1 due to Sepsis and hospitalization.  06-25-17 Genetic testing   Lymphedema issues, if any:  Rom to right arm Skin to right breast     Pain issues, if any:    SAFETY ISSUES:  Prior radiation? :  Pacemaker/ICD? : No  Possible current pregnancy? :  Is the patient on methotrexate? :No   Menarche  19  G0 P0  Does not want kids                                  LMP: July 23rd, 2018 Contraceptive: 20-27 on birth control pill HRT: NA  Current Complaints / other details:      Marshell Levan,  Particia Jasper, RN 01/01/2018,3:36 PM

## 2018-01-01 NOTE — Progress Notes (Signed)
Sheila [name]   HTX:[birth year]   TR#:[id number]   WYS#:[id number]  ONCOLOGY FOLLOW UP   Subjective: Sheila [name] feels better overall today.  Her fever has resolved since earlier this morning, he was sitting in the bed talking to her husband when I stop by to see her.  She has not gotten out of bed, denies any headaches or dizziness, eating better.  She had a fever during her blood transfusion yesterday, and only received half unit.  Objective:  Vitals:   01/01/18 1500 01/01/18 1600  BP: (!) 105/44   Pulse:    Resp: 18   Temp:  99.8 F (37.7 C)  SpO2: 98%     Body mass index is 27.79 kg/m.  Intake/Output Summary (Last 24 hours) at 01/01/2018 1750 Last data filed at 01/01/2018 1400 Gross per 24 hour  Intake 3776.67 ml  Output 2130 ml  Net 1646.67 ml     Sclerae unicteric  Lungs clear -- no rales or rhonchi  Heart regular rate and rhythm  Abdomen benign  MSK no focal spinal tenderness, no peripheral edema  Neuro nonfocal   CBG (last 3)  Recent Labs    12/30/17 1125  GLUCAP 148*     Labs:  Lab Results  Component Value Date   WBC 5.7 01/01/2018   HGB 7.6 (L) 01/01/2018   HCT 23.2 (L) 01/01/2018   MCV 96.3 01/01/2018   PLT 77 (L) 01/01/2018   NEUTROABS 4.6 12/31/2017   CMP Latest Ref Rng & Units 01/01/2018 12/31/2017 12/30/2017  Glucose 65 - 99 mg/dL 153(H) 103(H) 121(H)  BUN 6 - 20 mg/dL <5(L) 5(L) <5(L)  Creatinine 0.44 - 1.00 mg/dL 0.90 0.85 0.99  Sodium 135 - 145 mmol/L 145 141 142  Potassium 3.5 - 5.1 mmol/L 2.8(L) 4.2 3.3(L)  Chloride 101 - 111 mmol/L 115(H) 111 111  CO2 22 - 32 mmol/L 22 20(L) 23  Calcium 8.9 - 10.3 mg/dL 7.7(L) 6.1(LL) 7.8(L)  Total Protein 6.5 - 8.1 g/dL - - -  Total Bilirubin 0.3 - 1.2 mg/dL - - -  Alkaline Phos 38 - 126 U/L - - -  AST 15 - 41 U/L - - -  ALT 14 - 54 U/L - - -     Urine Studies No results for input(s): UHGB, CRYS in the last 72 hours.  Invalid input(s): UACOL, UAPR, USPG, UPH, UTP, UGL, [place], UBIL, UNIT, UROB, [place],  UEPI, UWBC, Duwayne Heck [place], [place]  Basic Metabolic Panel: Recent Labs  Lab 12/26/17 2114 12/28/17 1053 12/29/17 0350 12/30/17 0428 12/31/17 0543 01/01/18 0606  NA 140 135 143 142 141 145  K 3.2* 3.9 3.5 3.3* 4.2 2.8*  CL 105 104 111 111 111 115*  CO2 '25 24 23 23 ' 20* 22  GLUCOSE 104* 113* 101* 121* 103* 153*  BUN 5* 9 6 <5* 5* <5*  CREATININE 0.94 1.19* 0.96 0.99 0.85 0.90  CALCIUM 8.9 8.6* 8.1* 7.8* 6.1* 7.7*  MG 1.9  --   --   --   --  1.5*   GFR Estimated Creatinine Clearance: 88.5 mL/min (by C-G formula based on SCr of 0.9 mg/dL). Liver Function Tests: Recent Labs  Lab 12/26/17 2114 12/28/17 1053 12/31/17 0543 01/01/18 0606  AST 23 22  --   --   ALT 21 17  --   --   ALKPHOS 80 59  --   --   BILITOT 0.2* 0.4  --   --   PROT 6.5 6.1*  --   --  ALBUMIN 3.7 3.4* 2.3* 2.4*   Recent Labs  Lab 12/26/17 2114  LIPASE 31   No results for input(s): AMMONIA in the last 168 hours. Coagulation profile No results for input(s): INR, PROTIME in the last 168 hours.  CBC: Recent Labs  Lab 12/28/17 1053 12/29/17 0350 12/30/17 0428 12/31/17 0543 12/31/17 2100 01/01/18 0606  WBC 6.6 5.0 5.6 7.4 6.3 5.7  NEUTROABS 5.3  --   --   --  4.6  --   HGB 8.6* 7.9* 7.2* 7.5* 7.9* 7.6*  HCT 26.5* 25.2* 22.8* 23.2* 24.5* 23.2*  MCV 98.1 99.6 100.0 99.1 97.2 96.3  PLT 124* 107* 101* 91* 84* 77*   Cardiac Enzymes: No results for input(s): CKTOTAL, CKMB, CKMBINDEX, TROPONINI in the last 168 hours. BNP: Invalid input(s): POCBNP CBG: Recent Labs  Lab 12/30/17 1125  GLUCAP 148*   D-Dimer Recent Labs    12/30/17 1246  DDIMER 0.62*   Hgb A1c No results for input(s): HGBA1C in the last 72 hours. Lipid Profile No results for input(s): CHOL, HDL, LDLCALC, TRIG, CHOLHDL, LDLDIRECT in the last 72 hours. Thyroid function studies No results for input(s): TSH, T4TOTAL, T3FREE, THYROIDAB in the last 72 hours.  Invalid input(s): FREET3 Anemia work up No results for  input(s): VITAMINB12, FOLATE, FERRITIN, TIBC, IRON, RETICCTPCT in the last 72 hours. Microbiology Recent Results (from the past 240 hour(s))  Urine culture     Status: None   Collection Time: 12/28/17  2:21 PM  Result Value Ref Range Status   Specimen Description   Final    URINE, RANDOM Performed at [facility] [address], [place], [place] [address]    Special Requests   Final    NONE Performed at [facility], [place] [address], [place], [place] [address]    Culture   Final    NO GROWTH Performed at [facility], [place] [address] [place], [place] [address]    Report Status 12/30/2017 FINAL  Final  Blood culture (routine x 2)     Status: None (Preliminary result)   Collection Time: 12/28/17  2:38 PM  Result Value Ref Range Status   Specimen Description   Final    BLOOD PORTA CATH Performed at [facility], [place] [address] [place], [place] [address]    Special Requests   Final    BOTTLES DRAWN AEROBIC AND ANAEROBIC Blood Culture adequate volume Performed at [facility] [address] [place], [place] [address]    Culture   Final    NO GROWTH 3 DAYS Performed at [facility], [place] [address] [place], [place] [address]    Report Status PENDING  Incomplete  Blood culture (routine x 2)     Status: None (Preliminary result)   Collection Time: 12/28/17  2:38 PM  Result Value Ref Range Status   Specimen Description   Final    BLOOD LEFT ANTECUBITAL Performed at [facility], [place] [address], [place], [place] [address]    Special Requests   Final    BOTTLES DRAWN AEROBIC AND ANAEROBIC Blood Culture adequate volume Performed at [facility] [address], [place], [place] [address]    Culture   Final    NO GROWTH 3 DAYS Performed at [facility], [place] [address], [place], [place] [address]    Report Status PENDING  Incomplete  Respiratory Panel by PCR     Status:  Abnormal  Collection Time: 12/28/17  4:32 PM  Result Value Ref Range Status   Adenovirus NOT DETECTED NOT DETECTED Final   Coronavirus 229E NOT DETECTED NOT DETECTED Final   Coronavirus HKU1 NOT DETECTED NOT DETECTED Final   Coronavirus NL63 NOT DETECTED NOT DETECTED Final   Coronavirus OC43 NOT DETECTED NOT DETECTED Final   Metapneumovirus NOT DETECTED NOT DETECTED Final   Rhinovirus / Enterovirus NOT DETECTED NOT DETECTED Final   Influenza A NOT DETECTED NOT DETECTED Final   Influenza A H1 NOT DETECTED NOT DETECTED Final   Influenza A H1 2009 DETECTED (A) NOT DETECTED Final   Influenza A H3 NOT DETECTED NOT DETECTED Final   Influenza B NOT DETECTED NOT DETECTED Final   Parainfluenza Virus 1 NOT DETECTED NOT DETECTED Final   Parainfluenza Virus 2 NOT DETECTED NOT DETECTED Final   Parainfluenza Virus 3 NOT DETECTED NOT DETECTED Final   Parainfluenza Virus 4 NOT DETECTED NOT DETECTED Final   Respiratory Syncytial Virus NOT DETECTED NOT DETECTED Final   Bordetella pertussis NOT DETECTED NOT DETECTED Final   Chlamydophila pneumoniae NOT DETECTED NOT DETECTED Final   Mycoplasma pneumoniae NOT DETECTED NOT DETECTED Final    Comment: Performed at [facility], [place] [address], [place], [place] [address]  C difficile quick scan w PCR reflex     Status: None   Collection Time: 12/29/17  5:38 AM  Result Value Ref Range Status   C Diff antigen NEGATIVE NEGATIVE Final   C Diff toxin NEGATIVE NEGATIVE Final   C Diff interpretation No C. difficile detected.  Final    Comment: Performed at [facility], [place] [address], [place], [place] [address]  Gastrointestinal Panel by PCR , Stool     Status: None   Collection Time: 12/29/17  5:38 AM  Result Value Ref Range Status   Campylobacter species NOT DETECTED NOT DETECTED Final   Plesimonas shigelloides NOT DETECTED NOT DETECTED Final   Salmonella species NOT DETECTED NOT DETECTED Final   Yersinia enterocolitica NOT DETECTED  NOT DETECTED Final   Vibrio species NOT DETECTED NOT DETECTED Final   Vibrio cholerae NOT DETECTED NOT DETECTED Final   Enteroaggregative E coli (EAEC) NOT DETECTED NOT DETECTED Final   Enteropathogenic E coli (EPEC) NOT DETECTED NOT DETECTED Final   Enterotoxigenic E coli (ETEC) NOT DETECTED NOT DETECTED Final   Shiga like toxin producing E coli (STEC) NOT DETECTED NOT DETECTED Final   E. coli O157 NOT DETECTED NOT DETECTED Final   Shigella/Enteroinvasive E coli (EIEC) NOT DETECTED NOT DETECTED Final   Cryptosporidium NOT DETECTED NOT DETECTED Final   Cyclospora cayetanensis NOT DETECTED NOT DETECTED Final   Entamoeba histolytica NOT DETECTED NOT DETECTED Final   Giardia lamblia NOT DETECTED NOT DETECTED Final   Adenovirus F40/41 NOT DETECTED NOT DETECTED Final   Astrovirus NOT DETECTED NOT DETECTED Final   Norovirus GI/GII NOT DETECTED NOT DETECTED Final   Rotavirus A NOT DETECTED NOT DETECTED Final   Sapovirus (I, II, IV, and V) NOT DETECTED NOT DETECTED Final    Comment: Performed at [facility], [place] [place],  [address]  MRSA PCR Screening     Status: None   Collection Time: 12/30/17 12:00 PM  Result Value Ref Range Status   MRSA by PCR NEGATIVE NEGATIVE Final    Comment:        The GeneXpert MRSA Assay (FDA approved for NASAL specimens only), is one component of a comprehensive MRSA colonization surveillance program.  It is not intended to diagnose MRSA infection nor to guide or monitor treatment for MRSA infections. Performed at [facility], [place] [address] [place], [place] [address]       Studies:  No results found.  Assessment: 42 y.o. female with recently diagnosed stage IIA triple positive breast cancer, status post surgery and recently finished adjuvant chemotherapy, was admitted for fever, fatigue, sore throat and diarrhea.  1.  Influenza A infection 2.  Persistent fever and sepsis, improved  3. Multifocal  pneumonia, with hypoxia 4.  Syncope episode 2/12  5.  Stage II a ER/PR/HER-2 triple positive breast cancer, status post surgery and adjuvant chemotherapy 6.  Right middle lobe lung lesion and bone lesions, suspicious for breast cancer metastasis 7.  Nausea, vomiting, diarrhea likely secondary to chemo 8.  Anemia and thrombocytopenia, secondary to chemotherapy, worsening  9.  Hypokalemia, resolved  Plan:  -She is clinically doing better, fever resolved now, currently on Vanco and Zosyn, will complete the course of Tamiflu tomorrow. Appreciate ID input -She only received half unit of blood yesterday, due to her fever.  She may get another unit today, please keep her Hb>7.5 -Her platelet count is trending down, secondary to chemo and sepsis.  We will continue monitoring, did not need platelet transfusion unless platelet count less than 20K or active bleeding. -I will inform Dr. Lisbeth Renshaw to postpone her radiation which is scheduled for next week.  I have canceled her Herceptin infusion tomorrow. -I will follow-up as needed during her hospital stay, and set up her PET scan and follow-up after her discharge.  [name]  01/01/2018

## 2018-01-01 NOTE — Progress Notes (Signed)
Patient ID: Sheila [name], female   DOB: 1976/08/19, 42 y.o.   MRN: [id number]          [facility]  Date of Admission:  12/28/2017           Day 5 oseltamivir        Day 5 piperacillin tazobactam        Day 2 vancomycin ASSESSMENT: She is improving on therapy for influenza A and presumed superimposed bacterial pneumonia.  PLAN: 1. Stop oseltamivir after tomorrow morning's dose 2. Continue piperacillin tazobactam and vancomycin for now  Principal Problem:   Multifocal pneumonia Active Problems:   Sepsis ([place])   Influenza A   Malignant neoplasm of upper-outer quadrant of right breast in female, estrogen receptor positive (HCC)   Lactic acidosis   Lung mass   Diarrhea   Sinus tachycardia   Fever due to infection   Hypoxia   Scheduled Meds: . benzonatate  100 mg Oral TID  . Chlorhexidine Gluconate Cloth  6 each Topical Daily  . cholecalciferol  2,000 Units Oral Daily  . fluticasone  1 spray Each Nare Daily  . mouth rinse  15 mL Mouth Rinse BID  . mirtazapine  30 mg Oral QHS  . oseltamivir  75 mg Oral BID  . potassium chloride  40 mEq Oral Q4H   Continuous Infusions: . sodium chloride 125 mL/hr at 01/01/18 1400  . piperacillin-tazobactam (ZOSYN)  IV Stopped (01/01/18 1544)  . vancomycin Stopped (01/01/18 1223)   PRN Meds:.acetaminophen **OR** acetaminophen, diphenoxylate-atropine, guaiFENesin-dextromethorphan, HYDROcodone-acetaminophen, ibuprofen, iopamidol, LORazepam, menthol-cetylpyridinium, ondansetron **OR** ondansetron (ZOFRAN) IV, phenol, prochlorperazine, sodium chloride flush   SUBJECTIVE: She is feeling better today.  He still has some cough but is not bringing up any sputum today.  She is not having any chest pain or shortness of breath.  Review of Systems: Review of Systems  Constitutional: Negative for chills, fever and malaise/fatigue.  Respiratory: Positive for cough. Negative for sputum production and shortness of  breath.   Cardiovascular: Negative for chest pain.  Gastrointestinal: Negative for abdominal pain, diarrhea, nausea and vomiting.    No Known Allergies  OBJECTIVE: Vitals:   01/01/18 1200 01/01/18 1300 01/01/18 1400 01/01/18 1500  BP: 111/65 (!) 95/45 (!) 100/49 (!) 105/44  Pulse:      Resp: (!) 25 (!) 21 (!) 24 18  Temp: 98.5 F (36.9 C)     TempSrc: Oral     SpO2: 94% 94% 93% 98%  Weight:      Height:       Body mass index is 27.79 kg/m.  Physical Exam  Constitutional: She is oriented to person, place, and time.  She is sitting up in bed talking with her husband.  Cardiovascular: Regular rhythm.  No murmur heard. She is tachycardic.  Pulmonary/Chest: Effort normal. She has no wheezes. She has no rales.  Her lungs are clear anteriorly.  Port-A-Cath site looks good.  Abdominal: Soft. She exhibits no distension. There is no tenderness.  Musculoskeletal: Normal range of motion. She exhibits no edema or tenderness.  Neurological: She is alert and oriented to person, place, and time.  Skin: No rash noted.  Psychiatric: Mood and affect normal.    Lab Results Lab Results  Component Value Date   WBC 5.7 01/01/2018   HGB 7.6 (L) 01/01/2018   HCT 23.2 (L) 01/01/2018   MCV 96.3 01/01/2018   PLT 77 (L) 01/01/2018    Lab Results  Component Value Date   CREATININE  0.90 01/01/2018   BUN <5 (L) 01/01/2018   NA 145 01/01/2018   K 2.8 (L) 01/01/2018   CL 115 (H) 01/01/2018   CO2 22 01/01/2018    Lab Results  Component Value Date   ALT 17 12/28/2017   AST 22 12/28/2017   ALKPHOS 59 12/28/2017   BILITOT 0.4 12/28/2017     Microbiology: Recent Results (from the past 240 hour(s))  Urine culture     Status: None   Collection Time: 12/28/17  2:21 PM  Result Value Ref Range Status   Specimen Description   Final    URINE, RANDOM Performed at [facility], [place] [address], [place], [place] [address]    Special Requests   Final    NONE Performed at  [facility], [place] [address], [place], [place] [address]    Culture   Final    NO GROWTH Performed at [facility], [place] [address] [place], [place] [address]    Report Status 12/30/2017 FINAL  Final  Blood culture (routine x 2)     Status: None (Preliminary result)   Collection Time: 12/28/17  2:38 PM  Result Value Ref Range Status   Specimen Description   Final    BLOOD PORTA CATH Performed at [facility], [place] [address], [place], [place] [address]    Special Requests   Final    BOTTLES DRAWN AEROBIC AND ANAEROBIC Blood Culture adequate volume Performed at [facility] [address], [place], [place] [address]    Culture   Final    NO GROWTH 3 DAYS Performed at [facility], [place] [address], [place], [place] [address]    Report Status PENDING  Incomplete  Blood culture (routine x 2)     Status: None (Preliminary result)   Collection Time: 12/28/17  2:38 PM  Result Value Ref Range Status   Specimen Description   Final    BLOOD LEFT ANTECUBITAL Performed at [facility], [place] [address], [place], [place] [address]    Special Requests   Final    BOTTLES DRAWN AEROBIC AND ANAEROBIC Blood Culture adequate volume Performed at [facility] [address] [place], [place] [address]    Culture   Final    NO GROWTH 3 DAYS Performed at [facility], [place] [address] [place],  [address]    Report Status PENDING  Incomplete  Respiratory Panel by PCR     Status: Abnormal   Collection Time: 12/28/17  4:32 PM  Result Value Ref Range Status   Adenovirus NOT DETECTED NOT DETECTED Final   Coronavirus 229E NOT DETECTED NOT DETECTED Final   Coronavirus HKU1 NOT DETECTED NOT DETECTED Final   Coronavirus NL63 NOT DETECTED NOT DETECTED Final   Coronavirus OC43 NOT DETECTED NOT DETECTED Final   Metapneumovirus NOT DETECTED NOT DETECTED Final   Rhinovirus / Enterovirus NOT DETECTED NOT DETECTED  Final   Influenza A NOT DETECTED NOT DETECTED Final   Influenza A H1 NOT DETECTED NOT DETECTED Final   Influenza A H1 2009 DETECTED (A) NOT DETECTED Final   Influenza A H3 NOT DETECTED NOT DETECTED Final   Influenza B NOT DETECTED NOT DETECTED Final   Parainfluenza Virus 1 NOT DETECTED NOT DETECTED Final   Parainfluenza Virus 2 NOT DETECTED NOT DETECTED Final   Parainfluenza Virus 3 NOT DETECTED NOT DETECTED Final   Parainfluenza Virus 4 NOT DETECTED NOT DETECTED Final  Respiratory Syncytial Virus NOT DETECTED NOT DETECTED Final   Bordetella pertussis NOT DETECTED NOT DETECTED Final   Chlamydophila pneumoniae NOT DETECTED NOT DETECTED Final   Mycoplasma pneumoniae NOT DETECTED NOT DETECTED Final    Comment: Performed at [facility], [place] [address] [place], [place] [address]  C difficile quick scan w PCR reflex     Status: None   Collection Time: 12/29/17  5:38 AM  Result Value Ref Range Status   C Diff antigen NEGATIVE NEGATIVE Final   C Diff toxin NEGATIVE NEGATIVE Final   C Diff interpretation No C. difficile detected.  Final    Comment: Performed at [facility], [place] [address], [place], [place] [address]  Gastrointestinal Panel by PCR , Stool     Status: None   Collection Time: 12/29/17  5:38 AM  Result Value Ref Range Status   Campylobacter species NOT DETECTED NOT DETECTED Final   Plesimonas shigelloides NOT DETECTED NOT DETECTED Final   Salmonella species NOT DETECTED NOT DETECTED Final   Yersinia enterocolitica NOT DETECTED NOT DETECTED Final   Vibrio species NOT DETECTED NOT DETECTED Final   Vibrio cholerae NOT DETECTED NOT DETECTED Final   Enteroaggregative E coli (EAEC) NOT DETECTED NOT DETECTED Final   Enteropathogenic E coli (EPEC) NOT DETECTED NOT DETECTED Final   Enterotoxigenic E coli (ETEC) NOT DETECTED NOT DETECTED Final   Shiga like toxin producing E coli (STEC) NOT DETECTED NOT DETECTED Final   E. coli O157 NOT DETECTED NOT DETECTED  Final   Shigella/Enteroinvasive E coli (EIEC) NOT DETECTED NOT DETECTED Final   Cryptosporidium NOT DETECTED NOT DETECTED Final   Cyclospora cayetanensis NOT DETECTED NOT DETECTED Final   Entamoeba histolytica NOT DETECTED NOT DETECTED Final   Giardia lamblia NOT DETECTED NOT DETECTED Final   Adenovirus F40/41 NOT DETECTED NOT DETECTED Final   Astrovirus NOT DETECTED NOT DETECTED Final   Norovirus GI/GII NOT DETECTED NOT DETECTED Final   Rotavirus A NOT DETECTED NOT DETECTED Final   Sapovirus (I, II, IV, and V) NOT DETECTED NOT DETECTED Final    Comment: Performed at [name], [place] [place], [place] [address]  MRSA PCR Screening     Status: None   Collection Time: 12/30/17 12:00 PM  Result Value Ref Range Status   MRSA by PCR NEGATIVE NEGATIVE Final    Comment:        The GeneXpert MRSA Assay (FDA approved for NASAL specimens only), is one component of a comprehensive MRSA colonization surveillance program. It is not intended to diagnose MRSA infection nor to guide or monitor treatment for MRSA infections. Performed at [facility], [place] [address] [place], [place] [address]     Michel Bickers, [facility] [phone] pager   [phone] cell 01/01/2018, 4:35 PM

## 2018-01-01 NOTE — Progress Notes (Signed)
Location of Breast Cancer:  Histology per Pathology Report:    Receptor Status: ER(), PR (), Her2-neu (), Ki-()  Did patient present with symptoms (if so, please note symptoms) or was this found on screening mammography?:   Past/Anticipated interventions by surgeon, if any:  Past/Anticipated interventions by medical oncology, if any: Chemotherapy   Lymphedema issues, if any:   Pain issues, if any:    SAFETY ISSUES:  Prior radiation? :  Pacemaker/ICD? :  Possible current pregnancy? :  Is the patient on methotrexate? :  Current Complaints / other details:      Georgena Spurling, RN 01/01/2018,3:36 PM

## 2018-01-01 NOTE — Progress Notes (Signed)
Pt making minimal urine. Bladder scanner showed > 974mLs. Hospitalist NP notified. Foley inserted. 1263mLs of urine drained at insertion.

## 2018-01-02 ENCOUNTER — Ambulatory Visit: Payer: PRIVATE HEALTH INSURANCE

## 2018-01-02 ENCOUNTER — Other Ambulatory Visit: Payer: PRIVATE HEALTH INSURANCE

## 2018-01-02 ENCOUNTER — Ambulatory Visit: Payer: PRIVATE HEALTH INSURANCE | Admitting: Hematology

## 2018-01-02 DIAGNOSIS — Z170 Estrogen receptor positive status [ER+]: Secondary | ICD-10-CM

## 2018-01-02 DIAGNOSIS — R001 Bradycardia, unspecified: Secondary | ICD-10-CM

## 2018-01-02 DIAGNOSIS — Z95828 Presence of other vascular implants and grafts: Secondary | ICD-10-CM

## 2018-01-02 DIAGNOSIS — C50411 Malignant neoplasm of upper-outer quadrant of right female breast: Secondary | ICD-10-CM

## 2018-01-02 DIAGNOSIS — J1000 Influenza due to other identified influenza virus with unspecified type of pneumonia: Secondary | ICD-10-CM

## 2018-01-02 LAB — BASIC METABOLIC PANEL
ANION GAP: 6 (ref 5–15)
BUN: <5 mg/dL — ABNORMAL LOW (ref 6–20)
CALCIUM: 7.9 mg/dL — AB (ref 8.9–10.3)
CO2: 24 mmol/L (ref 22–32)
Chloride: 112 mmol/L — ABNORMAL HIGH (ref 101–111)
Creatinine, Ser: 0.81 mg/dL (ref 0.44–1.00)
GFR calc non Af Amer: >60 mL/min (ref >60)
GLUCOSE: 100 mg/dL — AB (ref 65–99)
Potassium: 3.2 mmol/L — ABNORMAL LOW (ref 3.5–5.1)
Sodium: 142 mmol/L (ref 135–145)

## 2018-01-02 LAB — CBC
HCT: 30.5 % — ABNORMAL LOW (ref 36.0–46.0)
Hemoglobin: 10.1 g/dL — ABNORMAL LOW (ref 12.0–15.0)
MCH: 30.5 pg (ref 26.0–34.0)
MCHC: 33.1 g/dL (ref 30.0–36.0)
MCV: 92.1 fL (ref 78.0–100.0)
Platelets: 82 10*3/uL — ABNORMAL LOW (ref 150–400)
RBC: 3.31 MIL/uL — AB (ref 3.87–5.11)
RDW: 18.7 % — AB (ref 11.5–15.5)
WBC: 3.6 10*3/uL — AB (ref 4.0–10.5)

## 2018-01-02 LAB — ALBUMIN: Albumin: 2.5 g/dL — ABNORMAL LOW (ref 3.5–5.0)

## 2018-01-02 LAB — MAGNESIUM: Magnesium: 1.9 mg/dL (ref 1.7–2.4)

## 2018-01-02 MED ORDER — POTASSIUM CHLORIDE CRYS ER 10 MEQ PO TBCR
40.0000 meq | EXTENDED_RELEASE_TABLET | ORAL | Status: AC
  Administered 2018-01-02 (×2): 40 meq via ORAL
  Filled 2018-01-02 (×2): qty 4

## 2018-01-02 MED ORDER — POTASSIUM CHLORIDE CRYS ER 20 MEQ PO TBCR: 20.0000 meq | EXTENDED_RELEASE_TABLET | ORAL | Status: DC

## 2018-01-02 MED ORDER — POTASSIUM CHLORIDE 20 MEQ PO PACK: 20.0000 meq | PACK | ORAL | Status: DC

## 2018-01-02 NOTE — Progress Notes (Signed)
Patient ID: Sheila [name], female   DOB: 03-Nov-1976, 42 y.o.   MRN: [id number]         [facility]  Date of Admission:  12/28/2017                   Day 6 piperacillin tazobactam        Day 3 vancomycin ASSESSMENT: She is improving on therapy for HCAP superimposed on influenza A.  She completed oseltamivir this morning.  She still has some cough but it is now nonproductive.  She is very weak and has not been out of bed today.  Given that she was somewhat slow to respond to antibiotics and is immunosuppressed with recent chemotherapy I would recommend continuing current antibiotics for 3 more days.  PLAN: 1. Continue piperacillin tazobactam and vancomycin for 3 more days 2. I will sign off now  Principal Problem:   Multifocal pneumonia Active Problems:   Sepsis ([place])   Influenza A   Malignant neoplasm of upper-outer quadrant of right breast in female, estrogen receptor positive ([place])   Lactic acidosis   Lung mass   Diarrhea   Sinus tachycardia   Fever due to infection   Hypoxia   Scheduled Meds: . benzonatate  100 mg Oral TID  . Chlorhexidine Gluconate Cloth  6 each Topical Daily  . cholecalciferol  2,000 Units Oral Daily  . fluticasone  1 spray Each Nare Daily  . mouth rinse  15 mL Mouth Rinse BID  . mirtazapine  30 mg Oral QHS  . potassium chloride  40 mEq Oral Q4H   Continuous Infusions: . sodium chloride    . lactated ringers 125 mL/hr at 01/02/18 0928  . piperacillin-tazobactam (ZOSYN)  IV Stopped (01/02/18 4742)  . vancomycin 750 mg (01/02/18 1041)   PRN Meds:.acetaminophen **OR** acetaminophen, diphenoxylate-atropine, guaiFENesin-dextromethorphan, HYDROcodone-acetaminophen, ibuprofen, iopamidol, LORazepam, menthol-cetylpyridinium, ondansetron **OR** ondansetron (ZOFRAN) IV, phenol, prochlorperazine, sodium chloride flush   SUBJECTIVE: She is feeling better today.  He still has some cough but is not bringing up any sputum today.   She is not having any chest pain or shortness of breath.  Review of Systems: Review of Systems  Constitutional: Negative for chills, fever and malaise/fatigue.  Respiratory: Positive for cough. Negative for sputum production and shortness of breath.   Cardiovascular: Negative for chest pain.  Gastrointestinal: Negative for abdominal pain, diarrhea, nausea and vomiting.    No Known Allergies  OBJECTIVE: Vitals:   01/02/18 0900 01/02/18 1000 01/02/18 1100 01/02/18 1200  BP: 108/78 125/77 136/83 125/77  Pulse:      Resp: 17 20 17 12   Temp:    99.4 F (37.4 C)  TempSrc:    Oral  SpO2: 96% 97% 95% 96%  Weight:      Height:       Body mass index is 27.79 kg/m.  Physical Exam  Constitutional: She is oriented to person, place, and time.  She is sitting up in bed talking with her husband.  Cardiovascular: Regular rhythm.  No murmur heard. She is tachycardic.  Pulmonary/Chest: Effort normal. She has no wheezes. She has no rales.  Her lungs are clear anteriorly.  Port-A-Cath site looks good.  Abdominal: Soft. She exhibits no distension. There is no tenderness.  Musculoskeletal: Normal range of motion. She exhibits no edema or tenderness.  Neurological: She is alert and oriented to person, place, and time.  Skin: No rash noted.  Psychiatric: Mood and affect normal.    Lab Results  Lab Results  Component Value Date   WBC 3.6 (L) 01/02/2018   HGB 10.1 (L) 01/02/2018   HCT 30.5 (L) 01/02/2018   MCV 92.1 01/02/2018   PLT 82 (L) 01/02/2018    Lab Results  Component Value Date   CREATININE 0.81 01/02/2018   BUN <5 (L) 01/02/2018   NA 142 01/02/2018   K 3.2 (L) 01/02/2018   CL 112 (H) 01/02/2018   CO2 24 01/02/2018    Lab Results  Component Value Date   ALT 17 12/28/2017   AST 22 12/28/2017   ALKPHOS 59 12/28/2017   BILITOT 0.4 12/28/2017     Microbiology: Recent Results (from the past 240 hour(s))  Urine culture     Status: None   Collection Time: 12/28/17  2:21  PM  Result Value Ref Range Status   Specimen Description   Final    URINE, RANDOM Performed at [facility], [place] [address] [place], [place] [address]    Special Requests   Final    NONE Performed at [facility], [place] [address], [place], [place] [address]    Culture   Final    NO GROWTH Performed at [facility], [place] [address] [place], [place] [address]    Report Status 12/30/2017 FINAL  Final  Blood culture (routine x 2)     Status: None (Preliminary result)   Collection Time: 12/28/17  2:38 PM  Result Value Ref Range Status   Specimen Description   Final    BLOOD PORTA CATH Performed at [facility], [place] [address], [place], [place] [address]    Special Requests   Final    BOTTLES DRAWN AEROBIC AND ANAEROBIC Blood Culture adequate volume Performed at [place] [address], [place], [place] [address]    Culture   Final    NO GROWTH 3 DAYS Performed at [facility], [place] [address] [place], [place] [address]    Report Status PENDING  Incomplete  Blood culture (routine x 2)     Status: None (Preliminary result)   Collection Time: 12/28/17  2:38 PM  Result Value Ref Range Status   Specimen Description   Final    BLOOD LEFT ANTECUBITAL Performed at [facility], [place] [address] [place], [place] [address]    Special Requests   Final    BOTTLES DRAWN AEROBIC AND ANAEROBIC Blood Culture adequate volume Performed at [place] [address] [place], [place] [address]    Culture   Final    NO GROWTH 3 DAYS Performed at [facility], [place] [address], [place],  [address]    Report Status PENDING  Incomplete  Respiratory Panel by PCR     Status: Abnormal   Collection Time: 12/28/17  4:32 PM  Result Value Ref Range Status   Adenovirus NOT DETECTED NOT DETECTED Final   Coronavirus 229E NOT DETECTED NOT DETECTED Final   Coronavirus HKU1 NOT DETECTED NOT  DETECTED Final   Coronavirus NL63 NOT DETECTED NOT DETECTED Final   Coronavirus OC43 NOT DETECTED NOT DETECTED Final   Metapneumovirus NOT DETECTED NOT DETECTED Final   Rhinovirus / Enterovirus NOT DETECTED NOT DETECTED Final   Influenza A NOT DETECTED NOT DETECTED Final   Influenza A H1 NOT DETECTED NOT DETECTED Final   Influenza A H1 2009 DETECTED (A) NOT DETECTED Final   Influenza A H3 NOT DETECTED NOT DETECTED Final  Influenza B NOT DETECTED NOT DETECTED Final   Parainfluenza Virus 1 NOT DETECTED NOT DETECTED Final   Parainfluenza Virus 2 NOT DETECTED NOT DETECTED Final   Parainfluenza Virus 3 NOT DETECTED NOT DETECTED Final   Parainfluenza Virus 4 NOT DETECTED NOT DETECTED Final   Respiratory Syncytial Virus NOT DETECTED NOT DETECTED Final   Bordetella pertussis NOT DETECTED NOT DETECTED Final   Chlamydophila pneumoniae NOT DETECTED NOT DETECTED Final   Mycoplasma pneumoniae NOT DETECTED NOT DETECTED Final    Comment: Performed at [facility], [place] [address] [place], [place] [address]  C difficile quick scan w PCR reflex     Status: None   Collection Time: 12/29/17  5:38 AM  Result Value Ref Range Status   C Diff antigen NEGATIVE NEGATIVE Final   C Diff toxin NEGATIVE NEGATIVE Final   C Diff interpretation No C. difficile detected.  Final    Comment: Performed at [facility], [place] [address], [place], [place] [address]  Gastrointestinal Panel by PCR , Stool     Status: None   Collection Time: 12/29/17  5:38 AM  Result Value Ref Range Status   Campylobacter species NOT DETECTED NOT DETECTED Final   Plesimonas shigelloides NOT DETECTED NOT DETECTED Final   Salmonella species NOT DETECTED NOT DETECTED Final   Yersinia enterocolitica NOT DETECTED NOT DETECTED Final   Vibrio species NOT DETECTED NOT DETECTED Final   Vibrio cholerae NOT DETECTED NOT DETECTED Final   Enteroaggregative E coli (EAEC) NOT DETECTED NOT DETECTED Final   Enteropathogenic E coli  (EPEC) NOT DETECTED NOT DETECTED Final   Enterotoxigenic E coli (ETEC) NOT DETECTED NOT DETECTED Final   Shiga like toxin producing E coli (STEC) NOT DETECTED NOT DETECTED Final   E. coli O157 NOT DETECTED NOT DETECTED Final   Shigella/Enteroinvasive E coli (EIEC) NOT DETECTED NOT DETECTED Final   Cryptosporidium NOT DETECTED NOT DETECTED Final   Cyclospora cayetanensis NOT DETECTED NOT DETECTED Final   Entamoeba histolytica NOT DETECTED NOT DETECTED Final   Giardia lamblia NOT DETECTED NOT DETECTED Final   Adenovirus F40/41 NOT DETECTED NOT DETECTED Final   Astrovirus NOT DETECTED NOT DETECTED Final   Norovirus GI/GII NOT DETECTED NOT DETECTED Final   Rotavirus A NOT DETECTED NOT DETECTED Final   Sapovirus (I, II, IV, and V) NOT DETECTED NOT DETECTED Final    Comment: Performed at [facility], [place] [place], [place] [address]  MRSA PCR Screening     Status: None   Collection Time: 12/30/17 12:00 PM  Result Value Ref Range Status   MRSA by PCR NEGATIVE NEGATIVE Final    Comment:        The GeneXpert MRSA Assay (FDA approved for NASAL specimens only), is one component of a comprehensive MRSA colonization surveillance program. It is not intended to diagnose MRSA infection nor to guide or monitor treatment for MRSA infections. Performed at [facility], [place] [address], [place], [place] [address]     Michel Bickers, [place] [facility] [phone] pager   [phone] cell 01/02/2018, 1:32 PM

## 2018-01-02 NOTE — Progress Notes (Signed)
PROGRESS NOTE    Sheila [name]  STM:[id number] DOB: Mar 04, 1976 DOA: 12/28/2017 PCP: [name], MD   Brief Narrative:  42 year old female with history of breast cancer (oncologist is Dr. Burr Medico), ER, PR positive, H ER 2+, status post bilateral mastectomies and on adjuvant chemotherapy with TCHP last 2 weeks ago, sinus tachycardia being evaluated by cardiology as an outpatient, presented to the ED with flulike symptoms.  Her husband was recently hospitalized with influenza, she was diagnosed with the same and was admitted to the hospital with sepsis due to influenza.  Assessment & Plan:   Principal Problem:   Multifocal pneumonia Active Problems:   Malignant neoplasm of upper-outer quadrant of right breast in female, estrogen receptor positive (HCC)   Sepsis ([place])   Lactic acidosis   Lung mass   Diarrhea   Sinus tachycardia   Influenza A   Fever due to infection   Hypoxia  Sepsis (HCC)secondary to influenza A  Acute Hypoxic Respiratory Failure 2/2 multifocal pneumonia - CT of chest 2/12 with "multifocal/patchy opacities in RUL, RML, and bilateral lower lobes, suspicious for multifocal pneumonia" (see report - f/u imaging recommended) -Patient metsepsiscriteria at the time of admission with fever, tachycardia, lactic acidosis, dehydration, possibly due to influenza versus colitis.  -Influenza A was positive -Continue Tamiflu (to complete today), last fever looks to be 2/13 ~2300, placed on Zosyn empirically for concern for intraabdominal infection at presentation with N/V/D, but notable CT abdomen pelvis on 2/11 without definite intra-abdominal or intrapelvic abnormality.  Will continue zosyn for now with developing multifocal pneumonia - Vancomycin added (2/13 - ) for presumed superimposed bacterial pneumonia - sputum cx pending, blood cx NGTD - Consulted ID for assistance given worsening clinical status   Hypotension: Soft Bp's overnight.  Improved this morning when I  was in room to 112/67. - MAP goal 65, will continue to monitor  Anemia:  Improved after transfusion 2/15.  Continue to monitor.  Initial transfusion held due to fever, transfusion panel without lab evidence of hemolytic tranfusion reaction. S/p 2 units  2/14 S/p partial transfusion 2/13  Thrombocytopenia: 2/2 sepsis, malignancy, chemo.  Continue to monitor.  As low as 81 on 2/1.  Hold heparin for now.  Leukopenia: continue to monitor  Hypocalcemia: Improved today.  Hypokalemia  Hypomagnesemia: replete and follow  Urinary retention: with >1 L in bladder 2/13.  Foley in place.  Discontinue with trial of void and bladder scans today.  NAGMA: improved  Syncopal episode -Patient had a syncopal episode and passed out while standing up 2/12 in the hospital room.  Pt reports to me that she was using the bathroom at this time.  Nurse tech was at bedside and guided patient to the floor, CODE BLUE was called.  There are no reports that patient lost pulse. She has little recollection of these events.  She was transferred back to bed, where she returned to normal.  She was evaluated at bedside, and was alert and oriented x4, vital signs were stable however, but she had new oxygen requirement.  EKG non ischemic.  CTA negative for PE, but with multifocal pneumonia as above.  Head CT with and without contrast without acute intracranial abnormality or enhancing lesion.  Suspect this was vasovagal or secondary to orthostasis in setting of sepsis or increased vagal tone when using bathroom (but with low BP's suspect sepsis, hypovolemia, and orthostasis may be more likely).   Nausea, vomiting, diarrhea - improved -C. difficile negative and started on Lomotil -likely due to  influenza, continue supportive management, continue IV fluids  Lung mass,newmetastasis onCT chest inthe setting of known history of breast cancer -CT chest showed 2.3x2.7x1.2 cm irregular mass like opacity in the right middle lobe  worrisome for malignancy/metastasis, 3 new small sclerotic lesions in the sternum and body of T4 -CT abdomen pelvis without acute findings -Oncology consulted  Sinus tachycardia -Likely worsened due to #1 (holding coreg for now with soft BP's and syncope 2/12) -2D echo 2/1 showed EF of 60%, no regional wall motion abnormalities.  Notable for GLS -15%. -TSH 0.692, follows Dr. Aundra Dubin  DVT prophylaxis: SCD's, holding heparin with thrombocytopenia Code Status: full  Family Communication: none at bedside Disposition Plan: pending   Consultants:   Oncology  ID  Procedures:   none  Antimicrobials:  Anti-infectives (From admission, onward)   Start     Dose/Rate Route Frequency Ordered Stop   01/01/18 1000  vancomycin (VANCOCIN) IVPB 750 mg/150 ml premix     750 mg 150 mL/hr over 60 Minutes Intravenous Every 12 hours 12/31/17 1745     12/31/17 1930  vancomycin (VANCOCIN) 1,500 mg in sodium chloride 0.9 % 500 mL IVPB     1,500 mg 250 mL/hr over 120 Minutes Intravenous  Once 12/31/17 1745 12/31/17 2200   12/28/17 1800  piperacillin-tazobactam (ZOSYN) IVPB 3.375 g     3.375 g 12.5 mL/hr over 240 Minutes Intravenous Every 8 hours 12/28/17 1745     12/28/17 1630  oseltamivir (TAMIFLU) capsule 75 mg     75 mg Oral 2 times daily 12/28/17 1613 01/02/18 2159   12/28/17 1530  vancomycin (VANCOCIN) IVPB 1000 mg/200 mL premix     1,000 mg 200 mL/hr over 60 Minutes Intravenous  Once 12/28/17 1529 12/28/17 1849   12/28/17 1530  piperacillin-tazobactam (ZOSYN) IVPB 3.375 g  Status:  Discontinued     3.375 g 100 mL/hr over 30 Minutes Intravenous  Once 12/28/17 1529 12/28/17 1744         Subjective: Feeling progressively better. No pain.  Asking about diet. Nasal congestion, better with humidified air.   Objective: Vitals:   01/02/18 0400 01/02/18 0500 01/02/18 0600 01/02/18 0800  BP: (!) 89/46 104/69 (!) 91/49   Pulse:      Resp: '14 16 15   ' Temp:  97.7 F (36.5 C)  99.3 F  (37.4 C)  TempSrc:  Oral  Oral  SpO2: 96% 99% 96%   Weight:      Height:        Intake/Output Summary (Last 24 hours) at 01/02/2018 0854 Last data filed at 01/02/2018 0800 Gross per 24 hour  Intake 3909.58 ml  Output 5185 ml  Net -1275.42 ml   Filed Weights   12/28/17 1051 12/28/17 1655 12/30/17 1200  Weight: 77.1 kg (170 lb) 78.8 kg (173 lb 12.8 oz) 78.1 kg (172 lb 2.9 oz)    Examination:  General: No acute distress. Cardiovascular: Heart sounds show a regular rate, and rhythm. No gallops or rubs. No murmurs. No JVD. Lungs: Clear to auscultation bilaterally with good air movement. No rales, rhonchi or wheezes. Abdomen: Soft, nontender, nondistended with normal active bowel sounds. No masses. No hepatosplenomegaly. Neurological: Alert and oriented 3. Moves all extremities 4 with equal strength. Cranial nerves II through XII grossly intact. Skin: Warm and dry. No rashes or lesions. Extremities: No clubbing or cyanosis. No edema.  Psychiatric: Mood and affect are normal. Insight and judgment are appropriate.   Data Reviewed: I have personally reviewed following labs and imaging  studies  CBC: Recent Labs  Lab 12/28/17 1053  12/30/17 0428 12/31/17 0543 12/31/17 2100 01/01/18 0606 01/02/18 0656  WBC 6.6   < > 5.6 7.4 6.3 5.7 3.6*  NEUTROABS 5.3  --   --   --  4.6  --   --   HGB 8.6*   < > 7.2* 7.5* 7.9* 7.6* 10.1*  HCT 26.5*   < > 22.8* 23.2* 24.5* 23.2* 30.5*  MCV 98.1   < > 100.0 99.1 97.2 96.3 92.1  PLT 124*   < > 101* 91* 84* 77* 82*   < > = values in this interval not displayed.   Basic Metabolic Panel: Recent Labs  Lab 12/26/17 2114  12/29/17 0350 12/30/17 0428 12/31/17 0543 01/01/18 0606 01/02/18 0656  NA 140   < > 143 142 141 145 142  K 3.2*   < > 3.5 3.3* 4.2 2.8* 3.2*  CL 105   < > 111 111 111 115* 112*  CO2 25   < > 23 23 20* 22 24  GLUCOSE 104*   < > 101* 121* 103* 153* 100*  BUN 5*   < > 6 <5* 5* <5* <5*  CREATININE 0.94   < > 0.96 0.99 0.85  0.90 0.81  CALCIUM 8.9   < > 8.1* 7.8* 6.1* 7.7* 7.9*  MG 1.9  --   --   --   --  1.5* 1.9   < > = values in this interval not displayed.   GFR: Estimated Creatinine Clearance: 98.3 mL/min (by C-G formula based on SCr of 0.81 mg/dL). Liver Function Tests: Recent Labs  Lab 12/26/17 2114 12/28/17 1053 12/31/17 0543 01/01/18 0606 01/02/18 0656  AST 23 22  --   --   --   ALT 21 17  --   --   --   ALKPHOS 80 59  --   --   --   BILITOT 0.2* 0.4  --   --   --   PROT 6.5 6.1*  --   --   --   ALBUMIN 3.7 3.4* 2.3* 2.4* 2.5*   Recent Labs  Lab 12/26/17 2114  LIPASE 31   No results for input(s): AMMONIA in the last 168 hours. Coagulation Profile: No results for input(s): INR, PROTIME in the last 168 hours. Cardiac Enzymes: No results for input(s): CKTOTAL, CKMB, CKMBINDEX, TROPONINI in the last 168 hours. BNP (last 3 results) No results for input(s): PROBNP in the last 8760 hours. HbA1C: No results for input(s): HGBA1C in the last 72 hours. CBG: Recent Labs  Lab 12/30/17 1125  GLUCAP 148*   Lipid Profile: No results for input(s): CHOL, HDL, LDLCALC, TRIG, CHOLHDL, LDLDIRECT in the last 72 hours. Thyroid Function Tests: No results for input(s): TSH, T4TOTAL, FREET4, T3FREE, THYROIDAB in the last 72 hours. Anemia Panel: No results for input(s): VITAMINB12, FOLATE, FERRITIN, TIBC, IRON, RETICCTPCT in the last 72 hours. Sepsis Labs: Recent Labs  Lab 12/26/17 2141 12/28/17 1239 12/28/17 1438 12/28/17 1631 12/29/17 1429  PROCALCITON  --   --   --  <0.10  --   LATICACIDVEN 1.04 0.37* 2.2*  --  0.9    Recent Results (from the past 240 hour(s))  Urine culture     Status: None   Collection Time: 12/28/17  2:21 PM  Result Value Ref Range Status   Specimen Description   Final    URINE, RANDOM Performed at [facility], [place] [address] [place], [place] [address]  Special Requests   Final    NONE Performed at [facility], [place]  [address], [place], [place] [address]    Culture   Final    NO GROWTH Performed at [facility], [place] [address], [place], [place] [address]    Report Status 12/30/2017 FINAL  Final  Blood culture (routine x 2)     Status: None (Preliminary result)   Collection Time: 12/28/17  2:38 PM  Result Value Ref Range Status   Specimen Description   Final    BLOOD PORTA CATH Performed at [facility], [place] [address] [place], [place] [address]    Special Requests   Final    BOTTLES DRAWN AEROBIC AND ANAEROBIC Blood Culture adequate volume Performed at [place] [address], [place], [place] [address]    Culture   Final    NO GROWTH 3 DAYS Performed at [facility], [place] [address], [place], [place] [address]    Report Status PENDING  Incomplete  Blood culture (routine x 2)     Status: None (Preliminary result)   Collection Time: 12/28/17  2:38 PM  Result Value Ref Range Status   Specimen Description   Final    BLOOD LEFT ANTECUBITAL Performed at [facility], [place] [address], [place], [place] [address]    Special Requests   Final    BOTTLES DRAWN AEROBIC AND ANAEROBIC Blood Culture adequate volume Performed at [place] [address] [place], [place] [address]    Culture   Final    NO GROWTH 3 DAYS Performed at [facility], [place] [address] [place], [place] [address]    Report Status PENDING  Incomplete  Respiratory Panel by PCR     Status: Abnormal   Collection Time: 12/28/17  4:32 PM  Result Value Ref Range Status   Adenovirus NOT DETECTED NOT DETECTED Final   Coronavirus 229E NOT DETECTED NOT DETECTED Final   Coronavirus HKU1 NOT DETECTED NOT DETECTED Final   Coronavirus NL63 NOT DETECTED NOT DETECTED Final   Coronavirus OC43 NOT DETECTED NOT DETECTED Final   Metapneumovirus NOT DETECTED NOT DETECTED Final   Rhinovirus / Enterovirus NOT DETECTED NOT DETECTED Final   Influenza A NOT DETECTED NOT  DETECTED Final   Influenza A H1 NOT DETECTED NOT DETECTED Final   Influenza A H1 2009 DETECTED (A) NOT DETECTED Final   Influenza A H3 NOT DETECTED NOT DETECTED Final   Influenza B NOT DETECTED NOT DETECTED Final   Parainfluenza Virus 1 NOT DETECTED NOT DETECTED Final   Parainfluenza Virus 2 NOT DETECTED NOT DETECTED Final   Parainfluenza Virus 3 NOT DETECTED NOT DETECTED Final   Parainfluenza Virus 4 NOT DETECTED NOT DETECTED Final   Respiratory Syncytial Virus NOT DETECTED NOT DETECTED Final   Bordetella pertussis NOT DETECTED NOT DETECTED Final   Chlamydophila pneumoniae NOT DETECTED NOT DETECTED Final   Mycoplasma pneumoniae NOT DETECTED NOT DETECTED Final    Comment: Performed at [facility], [place]. [address] [place],  [address]  C difficile quick scan w PCR reflex     Status: None   Collection Time: 12/29/17  5:38 AM  Result Value Ref Range Status   C Diff antigen NEGATIVE NEGATIVE Final   C Diff toxin NEGATIVE NEGATIVE Final   C Diff interpretation No C. difficile detected.  Final    Comment: Performed at [facility], [place]  [address] [place], [place] [address]  Gastrointestinal Panel by PCR , Stool     Status: None   Collection Time: 12/29/17  5:38 AM  Result Value Ref Range Status   Campylobacter species NOT DETECTED NOT DETECTED Final   Plesimonas shigelloides NOT DETECTED NOT DETECTED Final   Salmonella species NOT DETECTED NOT DETECTED Final   Yersinia enterocolitica NOT DETECTED NOT DETECTED Final   Vibrio species NOT DETECTED NOT DETECTED Final   Vibrio cholerae NOT DETECTED NOT DETECTED Final   Enteroaggregative E coli (EAEC) NOT DETECTED NOT DETECTED Final   Enteropathogenic E coli (EPEC) NOT DETECTED NOT DETECTED Final   Enterotoxigenic E coli (ETEC) NOT DETECTED NOT DETECTED Final   Shiga like toxin producing E coli (STEC) NOT DETECTED NOT DETECTED Final   E. coli O157 NOT DETECTED NOT DETECTED Final   Shigella/Enteroinvasive E coli  (EIEC) NOT DETECTED NOT DETECTED Final   Cryptosporidium NOT DETECTED NOT DETECTED Final   Cyclospora cayetanensis NOT DETECTED NOT DETECTED Final   Entamoeba histolytica NOT DETECTED NOT DETECTED Final   Giardia lamblia NOT DETECTED NOT DETECTED Final   Adenovirus F40/41 NOT DETECTED NOT DETECTED Final   Astrovirus NOT DETECTED NOT DETECTED Final   Norovirus GI/GII NOT DETECTED NOT DETECTED Final   Rotavirus A NOT DETECTED NOT DETECTED Final   Sapovirus (I, II, IV, and V) NOT DETECTED NOT DETECTED Final    Comment: Performed at [facility], [place] [place], [place] [address]  MRSA PCR Screening     Status: None   Collection Time: 12/30/17 12:00 PM  Result Value Ref Range Status   MRSA by PCR NEGATIVE NEGATIVE Final    Comment:        The GeneXpert MRSA Assay (FDA approved for NASAL specimens only), is one component of a comprehensive MRSA colonization surveillance program. It is not intended to diagnose MRSA infection nor to guide or monitor treatment for MRSA infections. Performed at [facility], [place] [address] [place], [place] [address]          Radiology Studies: No results found.      Scheduled Meds: . benzonatate  100 mg Oral TID  . Chlorhexidine Gluconate Cloth  6 each Topical Daily  . cholecalciferol  2,000 Units Oral Daily  . fluticasone  1 spray Each Nare Daily  . mouth rinse  15 mL Mouth Rinse BID  . mirtazapine  30 mg Oral QHS  . oseltamivir  75 mg Oral BID  . potassium chloride  40 mEq Oral Q4H   Continuous Infusions: . sodium chloride    . lactated ringers 125 mL/hr at 01/01/18 2000  . piperacillin-tazobactam (ZOSYN)  IV 3.375 g (01/02/18 0604)  . vancomycin Stopped (01/01/18 2312)     LOS: 5 days    Time spent: over 30 min    Fayrene Helper, MD [facility] Pager [phone]  If 7PM-7AM, please contact night-coverage [email or web address] Password [facility] 01/02/2018, 8:54 AM

## 2018-01-02 NOTE — Progress Notes (Signed)
Patient is transferred from ICU at 1430;alert and oriented x 4; vital signs was taken; family member at bedside; will monitor patient as protocol.

## 2018-01-02 NOTE — Progress Notes (Signed)
Pharmacy Antibiotic Note  Sheila [name] is a 42 y.o. female admitted on 12/28/2017 with sepsis possibly due to influenza, gastroenteritis or chemo-related enteritis. Vancomycin 1gm IV and Zosyn 3.375gm IV x 1 dose ordered in ED.  Upon admission,  Pharmacy has been consulted for Zosyn dosing with vancomycin added by ID for worsening PNA in setting of influenza.   Today, 01/02/2018 Day #6 Zosyn, #2 Vanco Tmax 100.7 (during blood transfusion) WBC down 3.6 s/p chemo 1/25 SCr stable 0.81, CrC. ~95 ml/min Cultures neg to date, + Influenza 2/10 started on Tamiflu MRSA PCR negative  Plan:  Continue Vancomycin 750mg  IV q12h - will check levels if continues > 24 more hours  Continue Zosyn 3.375gm IV q8h (4hr extended infusions)  Daily SCr for increased risk of nephrotoxicity of above regimen  F.u cultures, de-escalate per ID recommendations   Height: 5\' 6"  (167.6 cm) Weight: 172 lb 2.9 oz (78.1 kg) IBW/kg (Calculated) : 59.3  Temp (24hrs), Avg:98.4 F (36.9 C), Min:97.3 F (36.3 C), Max:99.8 F (37.7 C)  Recent Labs  Lab 12/26/17 2141  12/28/17 1239 12/28/17 1438 12/29/17 0350 12/29/17 1429 12/30/17 0428 12/31/17 0543 12/31/17 2100 01/01/18 0606 01/02/18 0656  WBC  --    < >  --   --  5.0  --  5.6 7.4 6.3 5.7 3.6*  CREATININE  --    < >  --   --  0.96  --  0.99 0.85  --  0.90 0.81  LATICACIDVEN 1.04  --  0.37* 2.2*  --  0.9  --   --   --   --   --    < > = values in this interval not displayed.    Estimated Creatinine Clearance: 98.3 mL/min (by C-G formula based on SCr of 0.81 mg/dL).    No Known Allergies  Antimicrobials this admission:  2/10 zosyn >> 2/10 Tamiflu 75mg  BID x 5 days   2/13 vancomycin >>  Microbiology results:  2/10 BCx (portacath): ngtd 2/10 BCx (peripheral): ngtd 2/10 UCx: NGF 2/10 Influenza:  + Influenza A 2/10 Resp panel: Flu A 2/11 GI Panel: neg 2/11 C.diff: neg 2/12 MRSA PCR: neg 2/13 Sputum: no sample  Thank you for allowing pharmacy  to be a part of this patient's care.  Peggyann Juba, PharmD, BCPS Pager: [phone] 01/02/2018 8:43 AM

## 2018-01-03 LAB — BPAM RBC
BLOOD PRODUCT EXPIRATION DATE: 201902262359
BLOOD PRODUCT EXPIRATION DATE: 201903042359
Blood Product Expiration Date: 201902262359
ISSUE DATE / TIME: 201902131335
ISSUE DATE / TIME: 201902142356
ISSUE DATE / TIME: 201902150238
UNIT TYPE AND RH: 6200
UNIT TYPE AND RH: 6200
Unit Type and Rh: 6200

## 2018-01-03 LAB — TYPE AND SCREEN
ABO/RH(D): A POS
ANTIBODY SCREEN: NEGATIVE
UNIT DIVISION: 0
UNIT DIVISION: 0
UNIT DIVISION: 0

## 2018-01-03 LAB — CBC
HEMATOCRIT: 34.2 % — AB (ref 36.0–46.0)
HEMOGLOBIN: 10.7 g/dL — AB (ref 12.0–15.0)
MCH: 29.1 pg (ref 26.0–34.0)
MCHC: 31.3 g/dL (ref 30.0–36.0)
MCV: 92.9 fL (ref 78.0–100.0)
Platelets: 113 10*3/uL — ABNORMAL LOW (ref 150–400)
RBC: 3.68 MIL/uL — AB (ref 3.87–5.11)
RDW: 18.2 % — ABNORMAL HIGH (ref 11.5–15.5)
WBC: 3 10*3/uL — ABNORMAL LOW (ref 4.0–10.5)

## 2018-01-03 LAB — MAGNESIUM: Magnesium: 1.6 mg/dL — ABNORMAL LOW (ref 1.7–2.4)

## 2018-01-03 LAB — BASIC METABOLIC PANEL
Anion gap: 6 (ref 5–15)
BUN: 5 mg/dL — ABNORMAL LOW (ref 6–20)
CHLORIDE: 111 mmol/L (ref 101–111)
CO2: 25 mmol/L (ref 22–32)
CREATININE: 0.79 mg/dL (ref 0.44–1.00)
Calcium: 8.6 mg/dL — ABNORMAL LOW (ref 8.9–10.3)
GFR calc non Af Amer: >60 mL/min (ref >60)
Glucose, Bld: 103 mg/dL — ABNORMAL HIGH (ref 65–99)
Potassium: 3.8 mmol/L (ref 3.5–5.1)
Sodium: 142 mmol/L (ref 135–145)

## 2018-01-03 LAB — CULTURE, BLOOD (ROUTINE X 2)
Culture: NO GROWTH
Culture: NO GROWTH
Special Requests: ADEQUATE
Special Requests: ADEQUATE

## 2018-01-03 MED ORDER — MAGNESIUM SULFATE 2 GM/50ML IV SOLN
2.0000 g | Freq: Once | INTRAVENOUS | Status: AC
  Administered 2018-01-03: 2 g via INTRAVENOUS
  Filled 2018-01-03: qty 50

## 2018-01-03 NOTE — Progress Notes (Signed)
PROGRESS NOTE    Sheila [name]  IRC:[id number] DOB: 01-21-1976 DOA: 12/28/2017 PCP: [name], MD   Brief Narrative:  42 year old female with history of breast cancer (oncologist is Dr. Burr Medico), ER, PR positive, H ER 2+, status post bilateral mastectomies and on adjuvant chemotherapy with TCHP last 2 weeks ago, sinus tachycardia being evaluated by cardiology as an outpatient, presented to the ED with flulike symptoms.  Her husband was recently hospitalized with influenza, she was diagnosed with the same and was admitted to the hospital with sepsis due to influenza.  Assessment & Plan:   Principal Problem:   Multifocal pneumonia Active Problems:   Malignant neoplasm of upper-outer quadrant of right breast in female, estrogen receptor positive (HCC)   Sepsis ([place])   Lactic acidosis   Lung mass   Diarrhea   Sinus tachycardia   Influenza Sheila [name]   Fever due to infection   Hypoxia  Sepsis (HCC)secondary to influenza Sheila [name]  Acute Hypoxic Respiratory Failure 2/2 multifocal pneumonia - CT of chest 2/12 with "multifocal/patchy opacities in RUL, RML, and bilateral lower lobes, suspicious for multifocal pneumonia" (see report - f/u imaging recommended) -Patient metsepsiscriteria at the time of admission with fever, tachycardia, lactic acidosis, dehydration, possibly due to influenza versus colitis.  -Influenza Sheila [name] was positive -Completed tamiflu - Continue zosyn (2/10-18) and vanc (2/13- 18) for presumed superimposed bacterial pneumonia - last fever looks to be 2/13 ~2300, placed on Zosyn empirically for concern for intraabdominal infection at presentation with N/V/D, but notable CT abdomen pelvis on 2/11 without definite intra-abdominal or intrapelvic abnormality.  Will continue zosyn for now with developing multifocal pneumonia - sputum cx pending, blood cx NGTD - Consulted ID for assistance given worsening clinical status (appreciate recs, continue zosyn/vanc until 2/18)  Hypotension:  improved, follow  - MAP goal 65, will continue to monitor  Anemia:  Improved after transfusion 2/15.  Continue to monitor.  Initial transfusion held due to fever, transfusion panel without lab evidence of hemolytic tranfusion reaction. S/p 2 units  2/14 S/p partial transfusion 2/13 Stable  Thrombocytopenia: 2/2 sepsis, malignancy, chemo.  Continue to monitor.  As low as 81 on 2/1.  Hold heparin for now.  Stable  Leukopenia: continue to monitor  Hypocalcemia: Improved today.  Hypokalemia  Hypomagnesemia: replete and follow  Urinary retention: with >1 L in bladder 2/13.  Foley in place.  Discontinue with trial of void and bladder scans today.  Notes some urinary frequency s/p catheter removal, notes this has happened before.  D/c IVF and ctm.   NAGMA: improved  Syncopal episode -Patient had Sheila [name] syncopal episode and passed out while standing up 2/12 in the hospital room.  Pt reports to me that she was using the bathroom at this time.  Nurse tech was at bedside and guided patient to the floor, CODE BLUE was called.  There are no reports that patient lost pulse. She has little recollection of these events.  She was transferred back to bed, where she returned to normal.  She was evaluated at bedside, and was alert and oriented x4, vital signs were stable however, but she had new oxygen requirement.  EKG non ischemic.  CTA negative for PE, but with multifocal pneumonia as above.  Head CT with and without contrast without acute intracranial abnormality or enhancing lesion.  Suspect this was vasovagal or secondary to orthostasis in setting of sepsis or increased vagal tone when using bathroom (but with low BP's suspect sepsis, hypovolemia, and orthostasis may be more likely).  Nausea, vomiting, diarrhea - improved -C. difficile negative and started on Lomotil -likely due to influenza, continue supportive management, continue IV fluids  Lung mass,newmetastasis onCT chest inthe setting of  known history of breast cancer -CT chest showed 2.3x2.7x1.2 cm irregular mass like opacity in the right middle lobe worrisome for malignancy/metastasis, 3 new small sclerotic lesions in the sternum and body of T4 -CT abdomen pelvis without acute findings -Oncology consulted  Sinus tachycardia - improved -Likely worsened due to #1 (holding coreg for now with soft BP's and syncope 2/12) -2D echo 2/1 showed EF of 60%, no regional wall motion abnormalities.  Notable for GLS -15%. -TSH 0.692, follows Dr. Aundra Dubin  DVT prophylaxis: SCD's, holding heparin with thrombocytopenia Code Status: full  Family Communication: none at bedside Disposition Plan: pending   Consultants:   Oncology  ID  Procedures:   none  Antimicrobials:  Anti-infectives (From admission, onward)   Start     Dose/Rate Route Frequency Ordered Stop   01/01/18 1000  vancomycin (VANCOCIN) IVPB 750 mg/150 ml premix     750 mg 150 mL/hr over 60 Minutes Intravenous Every 12 hours 12/31/17 1745 01/05/18 1837   12/31/17 1930  vancomycin (VANCOCIN) 1,500 mg in sodium chloride 0.9 % 500 mL IVPB     1,500 mg 250 mL/hr over 120 Minutes Intravenous  Once 12/31/17 1745 12/31/17 2200   12/28/17 1800  piperacillin-tazobactam (ZOSYN) IVPB 3.375 g     3.375 g 12.5 mL/hr over 240 Minutes Intravenous Every 8 hours 12/28/17 1745 01/05/18 1838   12/28/17 1630  oseltamivir (TAMIFLU) capsule 75 mg     75 mg Oral 2 times daily 12/28/17 1613 01/02/18 1029   12/28/17 1530  vancomycin (VANCOCIN) IVPB 1000 mg/200 mL premix     1,000 mg 200 mL/hr over 60 Minutes Intravenous  Once 12/28/17 1529 12/28/17 1849   12/28/17 1530  piperacillin-tazobactam (ZOSYN) IVPB 3.375 g  Status:  Discontinued     3.375 g 100 mL/hr over 30 Minutes Intravenous  Once 12/28/17 1529 12/28/17 1744         Subjective: Feels ok Some stomach upset (no pain, just can't decide what to eat)  Objective: Vitals:   01/02/18 1441 01/02/18 1936 01/03/18 0423  01/03/18 1432  BP: 125/71 113/73 118/77 107/64  Pulse: 97 94 93 (!) 103  Resp: _0 Temp: 98.6 F (37 C) 98.9 F (37.2 C) 98.1 F (36.7 C) 99 F (37.2 C)  TempSrc: Oral Oral Oral Oral  SpO2: 96% 94% 97% 93%  Weight:      Height:        Intake/Output Summary (Last 24 hours) at 01/03/2018 1511 Last data filed at 01/03/2018 1429 Gross per 24 hour  Intake 3615 ml  Output 4201 ml  Net -586 ml   Filed Weights   12/28/17 1051 12/28/17 1655 12/30/17 1200  Weight: 77.1 kg (170 lb) 78.8 kg (173 lb 12.8 oz) 78.1 kg (172 lb 2.9 oz)    Examination:  General: No acute distress. Cardiovascular: Heart sounds show Yobani Schertzer regular rate, and rhythm. No gallops or rubs. No murmurs. No JVD. Lungs: Clear to auscultation bilaterally with good air movement. No rales, rhonchi or wheezes. Abdomen: Soft, nontender, nondistended with normal active bowel sounds. No masses. No hepatosplenomegaly. Neurological: Alert and oriented 3. Moves all extremities 4. Cranial nerves II through XII grossly intact. Skin: Warm and dry. No rashes or lesions. Extremities: No clubbing or cyanosis. No edema.  Psychiatric: Mood and affect are normal. Insight  and judgment are appropriate.  Data Reviewed: I have personally reviewed following labs and imaging studies  CBC: Recent Labs  Lab 12/28/17 1053  12/31/17 0543 12/31/17 2100 01/01/18 0606 01/02/18 0656 01/03/18 0447  WBC 6.6   < > 7.4 6.3 5.7 3.6* 3.0*  NEUTROABS 5.3  --   --  4.6  --   --   --   HGB 8.6*   < > 7.5* 7.9* 7.6* 10.1* 10.7*  HCT 26.5*   < > 23.2* 24.5* 23.2* 30.5* 34.2*  MCV 98.1   < > 99.1 97.2 96.3 92.1 92.9  PLT 124*   < > 91* 84* 77* 82* 113*   < > = values in this interval not displayed.   Basic Metabolic Panel: Recent Labs  Lab 12/30/17 0428 12/31/17 0543 01/01/18 0606 01/02/18 0656 01/03/18 0447  NA 142 141 145 142 142  K 3.3* 4.2 2.8* 3.2* 3.8  CL 111 111 115* 112* 111  CO2 23 20* _0 GLUCOSE 121* 103* 153* 100*  103*  BUN <5* 5* <5* <5* 5*  CREATININE 0.99 0.85 0.90 0.81 0.79  CALCIUM 7.8* 6.1* 7.7* 7.9* 8.6*  MG  --   --  1.5* 1.9 1.6*   GFR: Estimated Creatinine Clearance: 99.6 mL/min (by C-G formula based on SCr of 0.79 mg/dL). Liver Function Tests: Recent Labs  Lab 12/28/17 1053 12/31/17 0543 01/01/18 0606 01/02/18 0656  AST 22  --   --   --   ALT 17  --   --   --   ALKPHOS 59  --   --   --   BILITOT 0.4  --   --   --   PROT 6.1*  --   --   --   ALBUMIN 3.4* 2.3* 2.4* 2.5*   No results for input(s): LIPASE, AMYLASE in the last 168 hours. No results for input(s): AMMONIA in the last 168 hours. Coagulation Profile: No results for input(s): INR, PROTIME in the last 168 hours. Cardiac Enzymes: No results for input(s): CKTOTAL, CKMB, CKMBINDEX, TROPONINI in the last 168 hours. BNP (last 3 results) No results for input(s): PROBNP in the last 8760 hours. HbA1C: No results for input(s): HGBA1C in the last 72 hours. CBG: Recent Labs  Lab 12/30/17 1125  GLUCAP 148*   Lipid Profile: No results for input(s): CHOL, HDL, LDLCALC, TRIG, CHOLHDL, LDLDIRECT in the last 72 hours. Thyroid Function Tests: No results for input(s): TSH, T4TOTAL, FREET4, T3FREE, THYROIDAB in the last 72 hours. Anemia Panel: No results for input(s): VITAMINB12, FOLATE, FERRITIN, TIBC, IRON, RETICCTPCT in the last 72 hours. Sepsis Labs: Recent Labs  Lab 12/28/17 1239 12/28/17 1438 12/28/17 1631 12/29/17 1429  PROCALCITON  --   --  <0.10  --   LATICACIDVEN 0.37* 2.2*  --  0.9    Recent Results (from the past 240 hour(s))  Urine culture     Status: None   Collection Time: 12/28/17  2:21 PM  Result Value Ref Range Status   Specimen Description   Final    URINE, RANDOM Performed at [facility], [place] [address] [place], [place] [address]    Special Requests   Final    NONE Performed at [facility], [place] [address] [place], [place] [address]    Culture   Final      NO GROWTH Performed at [facility], [place] [address] [place], [place] [address]    Report Status 12/30/2017 FINAL  Final  Blood culture (routine x 2)     Status: None   Collection Time: 12/28/17  2:38 PM  Result Value Ref Range Status   Specimen Description   Final    BLOOD PORTA CATH Performed at [facility], [place] [address] [place], [place] [address]    Special Requests   Final    BOTTLES DRAWN AEROBIC AND ANAEROBIC Blood Culture adequate volume Performed at [place] [address], [place], [place] [address]    Culture   Final    NO GROWTH 5 DAYS Performed at [facility], [place] [address] [place], [place] [address]    Report Status 01/03/2018 FINAL  Final  Blood culture (routine x 2)     Status: None   Collection Time: 12/28/17  2:38 PM  Result Value Ref Range Status   Specimen Description   Final    BLOOD LEFT ANTECUBITAL Performed at [facility], [place] [address] [place], [place] [address]    Special Requests   Final    BOTTLES DRAWN AEROBIC AND ANAEROBIC Blood Culture adequate volume Performed at [place] [address] [place], [place] [address]    Culture   Final    NO GROWTH 5 DAYS Performed at [facility], [place] [address], [place], [place] [address]    Report Status 01/03/2018 FINAL  Final  Respiratory Panel by PCR     Status: Abnormal   Collection Time: 12/28/17  4:32 PM  Result Value Ref Range Status   Adenovirus NOT DETECTED NOT DETECTED Final   Coronavirus 229E NOT DETECTED NOT DETECTED Final   Coronavirus HKU1 NOT DETECTED NOT DETECTED Final   Coronavirus NL63 NOT DETECTED NOT DETECTED Final   Coronavirus OC43 NOT DETECTED NOT DETECTED Final   Metapneumovirus NOT DETECTED NOT DETECTED Final   Rhinovirus / Enterovirus NOT DETECTED NOT DETECTED Final   Influenza Wells Mabe NOT DETECTED NOT DETECTED Final   Influenza Ariane Ditullio H1 NOT DETECTED NOT DETECTED Final   Influenza Laquonda Welby H1 2009 DETECTED  (Emiya Loomer) NOT DETECTED Final   Influenza Latoia Eyster H3 NOT DETECTED NOT DETECTED Final   Influenza B NOT DETECTED NOT DETECTED Final   Parainfluenza Virus 1 NOT DETECTED NOT DETECTED Final   Parainfluenza Virus 2 NOT DETECTED NOT DETECTED Final   Parainfluenza Virus 3 NOT DETECTED NOT DETECTED Final   Parainfluenza Virus 4 NOT DETECTED NOT DETECTED Final   Respiratory Syncytial Virus NOT DETECTED NOT DETECTED Final   Bordetella pertussis NOT DETECTED NOT DETECTED Final   Chlamydophila pneumoniae NOT DETECTED NOT DETECTED Final   Mycoplasma pneumoniae NOT DETECTED NOT DETECTED Final    Comment: Performed at [facility], [place] [address] [place], [place] [address]  C difficile quick scan w PCR reflex     Status: None   Collection Time: 12/29/17  5:38 AM  Result Value Ref Range Status   C Diff antigen NEGATIVE NEGATIVE Final   C Diff toxin NEGATIVE NEGATIVE Final   C Diff interpretation No C. difficile detected.  Final    Comment: Performed at [facility], [place] [address] [place], [place] [address]  Gastrointestinal Panel by PCR , Stool     Status: None   Collection Time: 12/29/17  5:38 AM  Result Value Ref Range Status   Campylobacter species NOT DETECTED NOT DETECTED Final   Plesimonas shigelloides NOT DETECTED NOT DETECTED Final   Salmonella species NOT DETECTED NOT DETECTED Final  Yersinia enterocolitica NOT DETECTED NOT DETECTED Final   Vibrio species NOT DETECTED NOT DETECTED Final   Vibrio cholerae NOT DETECTED NOT DETECTED Final   Enteroaggregative E coli (EAEC) NOT DETECTED NOT DETECTED Final   Enteropathogenic E coli (EPEC) NOT DETECTED NOT DETECTED Final   Enterotoxigenic E coli (ETEC) NOT DETECTED NOT DETECTED Final   Shiga like toxin producing E coli (STEC) NOT DETECTED NOT DETECTED Final   E. coli O157 NOT DETECTED NOT DETECTED Final   Shigella/Enteroinvasive E coli (EIEC) NOT DETECTED NOT DETECTED Final   Cryptosporidium NOT DETECTED NOT DETECTED Final    Cyclospora cayetanensis NOT DETECTED NOT DETECTED Final   Entamoeba histolytica NOT DETECTED NOT DETECTED Final   Giardia lamblia NOT DETECTED NOT DETECTED Final   Adenovirus F40/41 NOT DETECTED NOT DETECTED Final   Astrovirus NOT DETECTED NOT DETECTED Final   Norovirus GI/GII NOT DETECTED NOT DETECTED Final   Rotavirus [place] NOT DETECTED NOT DETECTED Final   Sapovirus (I, II, IV, and V) NOT DETECTED NOT DETECTED Final    Comment: Performed at [facility], [place] [place], [place] [address]  MRSA PCR Screening     Status: None   Collection Time: 12/30/17 12:00 PM  Result Value Ref Range Status   MRSA by PCR NEGATIVE NEGATIVE Final    Comment:        The GeneXpert MRSA Assay (FDA approved for NASAL specimens only), is one component of Juline Sanderford comprehensive MRSA colonization surveillance program. It is not intended to diagnose MRSA infection nor to guide or monitor treatment for MRSA infections. Performed at [facility], [place] [address], [place], [place] [address]          Radiology Studies: No results found.      Scheduled Meds: . benzonatate  100 mg Oral TID  . Chlorhexidine Gluconate Cloth  6 each Topical Daily  . cholecalciferol  2,000 Units Oral Daily  . fluticasone  1 spray Each Nare Daily  . mouth rinse  15 mL Mouth Rinse BID  . mirtazapine  30 mg Oral QHS   Continuous Infusions: . sodium chloride    . piperacillin-tazobactam (ZOSYN)  IV 3.375 g (01/03/18 1429)  . vancomycin Stopped (01/03/18 1040)     LOS: 6 days    Time spent: over 30 min    Fayrene Helper, MD [facility] Pager [phone]  If 7PM-7AM, please contact night-coverage [email or web address] Password [email or web address] 01/03/2018, 3:11 PM

## 2018-01-04 LAB — BASIC METABOLIC PANEL
Anion gap: 7 (ref 5–15)
BUN: 7 mg/dL (ref 6–20)
CHLORIDE: 108 mmol/L (ref 101–111)
CO2: 26 mmol/L (ref 22–32)
CREATININE: 0.84 mg/dL (ref 0.44–1.00)
Calcium: 8.6 mg/dL — ABNORMAL LOW (ref 8.9–10.3)
GFR calc Af Amer: >60 mL/min (ref >60)
GFR calc non Af Amer: >60 mL/min (ref >60)
Glucose, Bld: 103 mg/dL — ABNORMAL HIGH (ref 65–99)
POTASSIUM: 3.6 mmol/L (ref 3.5–5.1)
SODIUM: 141 mmol/L (ref 135–145)

## 2018-01-04 LAB — CBC
HEMATOCRIT: 35.8 % — AB (ref 36.0–46.0)
Hemoglobin: 11.3 g/dL — ABNORMAL LOW (ref 12.0–15.0)
MCH: 29.8 pg (ref 26.0–34.0)
MCHC: 31.6 g/dL (ref 30.0–36.0)
MCV: 94.5 fL (ref 78.0–100.0)
PLATELETS: 129 10*3/uL — AB (ref 150–400)
RBC: 3.79 MIL/uL — ABNORMAL LOW (ref 3.87–5.11)
RDW: 17.6 % — ABNORMAL HIGH (ref 11.5–15.5)
WBC: 4 10*3/uL (ref 4.0–10.5)

## 2018-01-04 LAB — MAGNESIUM: Magnesium: 1.9 mg/dL (ref 1.7–2.4)

## 2018-01-04 MED ORDER — ENOXAPARIN SODIUM 40 MG/0.4ML ~~LOC~~ SOLN
40.0000 mg | SUBCUTANEOUS | Status: DC
  Administered 2018-01-04: 40 mg via SUBCUTANEOUS
  Filled 2018-01-04: qty 0.4

## 2018-01-04 NOTE — Evaluation (Signed)
Physical Therapy Evaluation Patient Details Name: Sheila [name] MRN: [id number] DOB: 1976-06-11 Today's Date: 01/04/2018   History of Present Illness  Pt admitted 2* weakness with dx of sepsis 2* influenza and HCAP.  Pt recently completed chemo 2* breast CA and is s/p bilat mastectomy  Clinical Impression  Pt admitted as above and presents with functional mobility limitations 2* generalized weakness and ambulatory balance deficits.  Pt should progress to dc home with assist of husband.    Follow Up Recommendations No PT follow up(Pt declines)    Equipment Recommendations  None recommended by PT    Recommendations for Other Services OT consult     Precautions / Restrictions Precautions Precautions: Fall Restrictions Weight Bearing Restrictions: No      Mobility  Bed Mobility Overal bed mobility: Modified Independent             General bed mobility comments: Pt to EOB unassisted  Transfers Overall transfer level: Needs assistance Equipment used: None Transfers: Sit to/from [employer] Transfers Sit to Stand: Min guard Stand pivot transfers: Min guard       General transfer comment: steady assist  Ambulation/Gait Ambulation/Gait assistance: Min assist Ambulation Distance (Feet): 400 Feet Assistive device: 1 person hand held assist Gait Pattern/deviations: Step-through pattern;Decreased step length - right;Decreased step length - left;Shuffle;Narrow base of support Gait velocity: decr Gait velocity interpretation: Below normal speed for age/gender General Gait Details: cues for posture, pace and to increase BOS.  Intermittent min assist to correct for instability and mild LOB  Stairs            Wheelchair Mobility    Modified Rankin (Stroke Patients Only)       Balance                                             Pertinent Vitals/Pain Pain Assessment: No/denies pain    Home Living Family/patient expects to be  discharged to:: Private residence Living Arrangements: Spouse/significant other Available Help at Discharge: Family Type of Home: Apartment Home Access: Stairs to enter Entrance Stairs-Rails: Right Entrance Stairs-Number of Steps: [place]: One level Home Equipment: Environmental consultant - 2 wheels Additional Comments: Lives in 2nd floor apt    Prior Function Level of Independence: Independent               Hand Dominance        Extremity/Trunk Assessment   Upper Extremity Assessment Upper Extremity Assessment: Generalized weakness    Lower Extremity Assessment Lower Extremity Assessment: Generalized weakness       Communication   Communication: No difficulties  Cognition Arousal/Alertness: Awake/alert Behavior During Therapy: WFL for tasks assessed/performed Overall Cognitive Status: Within Functional Limits for tasks assessed                                        General Comments      Exercises     Assessment/Plan    PT Assessment Patient needs continued PT services  PT Problem List Decreased strength;Decreased activity tolerance;Decreased balance;Decreased mobility;Decreased knowledge of use of DME       PT Treatment Interventions DME instruction;Gait training;Stair training;Functional mobility training;Therapeutic activities;Therapeutic exercise;Patient/family education    PT Goals (Current goals can be found in the Care Plan section)  Acute Rehab PT  Goals Patient Stated Goal: Regain strength and IND PT Goal Formulation: With patient Time For Goal Achievement: 01/18/18 Potential to Achieve Goals: Good    Frequency Min 3X/week   Barriers to discharge        Co-evaluation               AM-PAC PT "6 Clicks" Daily Activity  Outcome Measure Difficulty turning over in bed (including adjusting bedclothes, sheets and blankets)?: A Little Difficulty moving from lying on back to sitting on the side of the bed? : A Little Difficulty  sitting down on and standing up from a chair with arms (e.g., wheelchair, bedside commode, etc,.)?: A Little Help needed moving to and from a bed to chair (including a wheelchair)?: A Little Help needed walking in hospital room?: A Little Help needed climbing 3-5 steps with a railing? : A Little 6 Click Score: 18    End of Session Equipment Utilized During Treatment: Gait belt Activity Tolerance: Patient tolerated treatment well;Patient limited by fatigue Patient left: in bed;with call bell/phone within reach Nurse Communication: Mobility status PT Visit Diagnosis: Unsteadiness on feet (R26.81);Difficulty in walking, not elsewhere classified (R26.2)    Time: 1040-1102 PT Time Calculation (min) (ACUTE ONLY): 22 min   Charges:   PT Evaluation $PT Eval Low Complexity: 1 Low     PT G Codes:        Pg [phone]   Sheila [name] 01/04/2018, 2:38 PM

## 2018-01-04 NOTE — Plan of Care (Signed)
  Pain Managment: General experience of comfort will improve 01/04/2018 0016 - Adequate for Discharge by Mickie Kay, RN

## 2018-01-04 NOTE — Progress Notes (Signed)
PROGRESS NOTE    Sheila [name]  WUG:[id number] DOB: 1976-02-12 DOA: 12/28/2017 PCP: [name], MD   Brief Narrative:  42 year old female with history of breast cancer (oncologist is Dr. Burr Medico), ER, PR positive, H ER 2+, status post bilateral mastectomies and on adjuvant chemotherapy with TCHP last 2 weeks ago, sinus tachycardia being evaluated by cardiology as an outpatient, presented to the ED with flulike symptoms.  Her husband was recently hospitalized with influenza, she was diagnosed with the same and was admitted to the hospital with sepsis due to influenza.  Assessment & Plan:   Principal Problem:   Multifocal pneumonia Active Problems:   Malignant neoplasm of upper-outer quadrant of right breast in female, estrogen receptor positive (HCC)   Sepsis ([place])   Lactic acidosis   Lung mass   Diarrhea   Sinus tachycardia   Influenza Sheila [name]   Fever due to infection   Hypoxia  Sepsis (HCC)secondary to influenza Sheila [name]  Acute Hypoxic Respiratory Failure 2/2 multifocal pneumonia - CT of chest 2/12 with "multifocal/patchy opacities in RUL, RML, and bilateral lower lobes, suspicious for multifocal pneumonia" (see report - f/u imaging recommended) -Patient metsepsiscriteria at the time of admission with fever, tachycardia, lactic acidosis, dehydration, possibly due to influenza versus colitis.  -Influenza Sheila [name] was positive -Completed tamiflu - Continue zosyn (2/10-18) and vanc (2/13- 18) for presumed superimposed bacterial pneumonia - last fever looks to be 2/13 ~2300, placed on Zosyn empirically for concern for intraabdominal infection at presentation with N/V/D, but notable CT abdomen pelvis on 2/11 without definite intra-abdominal or intrapelvic abnormality.  Will continue zosyn for now with developing multifocal pneumonia - sputum cx pending, blood cx NGTD - Consulted ID for assistance given worsening clinical status (appreciate recs, continue zosyn/vanc until 2/18)  Hypotension:  still occasionally soft.   - MAP goal 65, will continue to monitor  Anemia:  Improved after transfusion 2/15.  Continue to monitor.  Initial transfusion held due to fever, transfusion panel without lab evidence of hemolytic tranfusion reaction. S/p 2 units  2/14 S/p partial transfusion 2/13 Stable  Thrombocytopenia: 2/2 sepsis, malignancy, chemo.  Continue to monitor.  As low as 81 on 2/1.   Improved.  Restart DVT ppx.   Leukopenia: improved  Hypocalcemia: Improved today.  Hypokalemia  Hypomagnesemia: replete and follow  Urinary retention: with >1 L in bladder 2/13.  Foley in place.  Discontinue with trial of void and bladder scans today.  Notes some urinary frequency s/p catheter removal, notes this has happened before.  D/c IVF and ctm.   NAGMA: improved  Syncopal episode -Patient had Sheila [name] syncopal episode and passed out while standing up 2/12 in the hospital room.  Pt reports to me that she was using the bathroom at this time.  Nurse tech was at bedside and guided patient to the floor, CODE BLUE was called.  There are no reports that patient lost pulse. She has little recollection of these events.  She was transferred back to bed, where she returned to normal.  She was evaluated at bedside, and was alert and oriented x4, vital signs were stable however, but she had new oxygen requirement.  EKG non ischemic.  CTA negative for PE, but with multifocal pneumonia as above.  Head CT with and without contrast without acute intracranial abnormality or enhancing lesion.  Suspect this was vasovagal or secondary to orthostasis in setting of sepsis or increased vagal tone when using bathroom (but with low BP's suspect sepsis, hypovolemia, and orthostasis may be more likely).  Nausea, vomiting, diarrhea - improved -C. difficile negative and started on Lomotil -likely due to influenza, continue supportive management, continue IV fluids  Lung mass,newmetastasis onCT chest inthe setting of known  history of breast cancer -CT chest showed 2.3x2.7x1.2 cm irregular mass like opacity in the right middle lobe worrisome for malignancy/metastasis, 3 new small sclerotic lesions in the sternum and body of T4 -CT abdomen pelvis without acute findings -Oncology consulted  Sinus tachycardia - currently holding home coreg due to BP, ctm -Likely worsened due to #1 (holding coreg for now with soft BP's and syncope 2/12) -2D echo 2/1 showed EF of 60%, no regional wall motion abnormalities.  Notable for GLS -15%. -TSH 0.692, follows Dr. Aundra Dubin  DVT prophylaxis: lovenox Code Status: full  Family Communication: none at bedside Disposition Plan: pending   Consultants:   Oncology  ID  Procedures:   none  Antimicrobials:  Anti-infectives (From admission, onward)   Start     Dose/Rate Route Frequency Ordered Stop   01/01/18 1000  vancomycin (VANCOCIN) IVPB 750 mg/150 ml premix     750 mg 150 mL/hr over 60 Minutes Intravenous Every 12 hours 12/31/17 1745 01/05/18 1837   12/31/17 1930  vancomycin (VANCOCIN) 1,500 mg in sodium chloride 0.9 % 500 mL IVPB     1,500 mg 250 mL/hr over 120 Minutes Intravenous  Once 12/31/17 1745 12/31/17 2200   12/28/17 1800  piperacillin-tazobactam (ZOSYN) IVPB 3.375 g     3.375 g 12.5 mL/hr over 240 Minutes Intravenous Every 8 hours 12/28/17 1745 01/05/18 1838   12/28/17 1630  oseltamivir (TAMIFLU) capsule 75 mg     75 mg Oral 2 times daily 12/28/17 1613 01/02/18 1029   12/28/17 1530  vancomycin (VANCOCIN) IVPB 1000 mg/200 mL premix     1,000 mg 200 mL/hr over 60 Minutes Intravenous  Once 12/28/17 1529 12/28/17 1849   12/28/17 1530  piperacillin-tazobactam (ZOSYN) IVPB 3.375 g  Status:  Discontinued     3.375 g 100 mL/hr over 30 Minutes Intravenous  Once 12/28/17 1529 12/28/17 1744         Subjective: Feels ok Tired.  Objective: Vitals:   01/03/18 2253 01/04/18 0516 01/04/18 1032 01/04/18 1350  BP: (!) 94/48 (!) 84/50 114/67 (!) 104/56    Pulse: 98 (!) 107 (!) 112 (!) 108  Resp: '18 18 18 18  ' Temp: 98.2 F (36.8 C) 98 F (36.7 C) 98.4 F (36.9 C) 99.3 F (37.4 C)  TempSrc: Oral Oral Oral Oral  SpO2: 94% 95% 95% 96%  Weight:      Height:        Intake/Output Summary (Last 24 hours) at 01/04/2018 1602 Last data filed at 01/04/2018 1409 Gross per 24 hour  Intake 440 ml  Output 1200 ml  Net -760 ml   Filed Weights   12/28/17 1051 12/28/17 1655 12/30/17 1200  Weight: 77.1 kg (170 lb) 78.8 kg (173 lb 12.8 oz) 78.1 kg (172 lb 2.9 oz)    Examination:  General: No acute distress. Cardiovascular: Heart sounds show Sheila [name] regular rate, and rhythm. No gallops or rubs. No murmurs. No JVD. Lungs: Clear to auscultation bilaterally with good air movement. No rales, rhonchi or wheezes. Abdomen: Soft, nontender, nondistended with normal active bowel sounds. No masses. No hepatosplenomegaly. Neurological: Alert and oriented 3. Moves all extremities 4. Cranial nerves II through XII grossly intact. Skin: Warm and dry. No rashes or lesions. Extremities: No clubbing or cyanosis. No edema. Psychiatric: Mood and affect are normal. Insight and judgment  are appropriate.   Data Reviewed: I have personally reviewed following labs and imaging studies  CBC: Recent Labs  Lab 12/31/17 2100 01/01/18 0606 01/02/18 0656 01/03/18 0447 01/04/18 0355  WBC 6.3 5.7 3.6* 3.0* 4.0  NEUTROABS 4.6  --   --   --   --   HGB 7.9* 7.6* 10.1* 10.7* 11.3*  HCT 24.5* 23.2* 30.5* 34.2* 35.8*  MCV 97.2 96.3 92.1 92.9 94.5  PLT 84* 77* 82* 113* 572*   Basic Metabolic Panel: Recent Labs  Lab 12/31/17 0543 01/01/18 0606 01/02/18 0656 01/03/18 0447 01/04/18 0355  NA 141 145 142 142 141  K 4.2 2.8* 3.2* 3.8 3.6  CL 111 115* 112* 111 108  CO2 20* '22 24 25 26  ' GLUCOSE 103* 153* 100* 103* 103*  BUN 5* <5* <5* 5* 7  CREATININE 0.85 0.90 0.81 0.79 0.84  CALCIUM 6.1* 7.7* 7.9* 8.6* 8.6*  MG  --  1.5* 1.9 1.6* 1.9   GFR: Estimated Creatinine  Clearance: 94.8 mL/min (by C-G formula based on SCr of 0.84 mg/dL). Liver Function Tests: Recent Labs  Lab 12/31/17 0543 01/01/18 0606 01/02/18 0656  ALBUMIN 2.3* 2.4* 2.5*   No results for input(s): LIPASE, AMYLASE in the last 168 hours. No results for input(s): AMMONIA in the last 168 hours. Coagulation Profile: No results for input(s): INR, PROTIME in the last 168 hours. Cardiac Enzymes: No results for input(s): CKTOTAL, CKMB, CKMBINDEX, TROPONINI in the last 168 hours. BNP (last 3 results) No results for input(s): PROBNP in the last 8760 hours. HbA1C: No results for input(s): HGBA1C in the last 72 hours. CBG: Recent Labs  Lab 12/30/17 1125  GLUCAP 148*   Lipid Profile: No results for input(s): CHOL, HDL, LDLCALC, TRIG, CHOLHDL, LDLDIRECT in the last 72 hours. Thyroid Function Tests: No results for input(s): TSH, T4TOTAL, FREET4, T3FREE, THYROIDAB in the last 72 hours. Anemia Panel: No results for input(s): VITAMINB12, FOLATE, FERRITIN, TIBC, IRON, RETICCTPCT in the last 72 hours. Sepsis Labs: Recent Labs  Lab 12/28/17 1631 12/29/17 1429  PROCALCITON <0.10  --   LATICACIDVEN  --  0.9    Recent Results (from the past 240 hour(s))  Urine culture     Status: None   Collection Time: 12/28/17  2:21 PM  Result Value Ref Range Status   Specimen Description   Final    URINE, RANDOM Performed at [place] [address] [place], [place] [address]    Special Requests   Final    NONE Performed at [facility], [place] [address] [place], [place] [address]    Culture   Final    NO GROWTH Performed at [facility], [place] [address] [place], [place] [address]    Report Status 12/30/2017 FINAL  Final  Blood culture (routine x 2)     Status: None   Collection Time: 12/28/17  2:38 PM  Result Value Ref Range Status   Specimen Description   Final    BLOOD PORTA CATH Performed at [facility], [place] [address],  [place], [place] [address]    Special Requests   Final    BOTTLES DRAWN AEROBIC AND ANAEROBIC Blood Culture adequate volume Performed at [place] [address], [place], [place] [address]    Culture   Final    NO GROWTH 5 DAYS Performed at [facility], [place] [address], [place],  [address]    Report Status 01/03/2018 FINAL  Final  Blood culture (routine x 2)     Status: None   Collection Time: 12/28/17  2:38 PM  Result Value Ref Range Status   Specimen Description   Final    BLOOD LEFT ANTECUBITAL Performed at [facility], [place] [address] [place], [place] [address]    Special Requests   Final    BOTTLES DRAWN AEROBIC AND ANAEROBIC Blood Culture adequate volume Performed at [place] [address], [place], [place] [address]    Culture   Final    NO GROWTH 5 DAYS Performed at [facility], [place] [address], [place], [place] [address]    Report Status 01/03/2018 FINAL  Final  Respiratory Panel by PCR     Status: Abnormal   Collection Time: 12/28/17  4:32 PM  Result Value Ref Range Status   Adenovirus NOT DETECTED NOT DETECTED Final   Coronavirus 229E NOT DETECTED NOT DETECTED Final   Coronavirus HKU1 NOT DETECTED NOT DETECTED Final   Coronavirus NL63 NOT DETECTED NOT DETECTED Final   Coronavirus OC43 NOT DETECTED NOT DETECTED Final   Metapneumovirus NOT DETECTED NOT DETECTED Final   Rhinovirus / Enterovirus NOT DETECTED NOT DETECTED Final   Influenza Sheila [name] NOT DETECTED NOT DETECTED Final   Influenza Sheila [name] H1 NOT DETECTED NOT DETECTED Final   Influenza Sheila [name] H1 2009 DETECTED (Sheila [name]) NOT DETECTED Final   Influenza Sheila [name] H3 NOT DETECTED NOT DETECTED Final   Influenza B NOT DETECTED NOT DETECTED Final   Parainfluenza Virus 1 NOT DETECTED NOT DETECTED Final   Parainfluenza Virus 2 NOT DETECTED NOT DETECTED Final   Parainfluenza Virus 3 NOT DETECTED NOT DETECTED Final   Parainfluenza Virus 4 NOT DETECTED NOT DETECTED Final    Respiratory Syncytial Virus NOT DETECTED NOT DETECTED Final   Bordetella pertussis NOT DETECTED NOT DETECTED Final   Chlamydophila pneumoniae NOT DETECTED NOT DETECTED Final   Mycoplasma pneumoniae NOT DETECTED NOT DETECTED Final    Comment: Performed at [facility], [place] [address] [place], [place] [address]  C difficile quick scan w PCR reflex     Status: None   Collection Time: 12/29/17  5:38 AM  Result Value Ref Range Status   C Diff antigen NEGATIVE NEGATIVE Final   C Diff toxin NEGATIVE NEGATIVE Final   C Diff interpretation No C. difficile detected.  Final    Comment: Performed at [facility], [place] [address], [place], [place] [address]  Gastrointestinal Panel by PCR , Stool     Status: None   Collection Time: 12/29/17  5:38 AM  Result Value Ref Range Status   Campylobacter species NOT DETECTED NOT DETECTED Final   Plesimonas shigelloides NOT DETECTED NOT DETECTED Final   Salmonella species NOT DETECTED NOT DETECTED Final   Yersinia enterocolitica NOT DETECTED NOT DETECTED Final   Vibrio species NOT DETECTED NOT DETECTED Final   Vibrio cholerae NOT DETECTED NOT DETECTED Final   Enteroaggregative E coli (EAEC) NOT DETECTED NOT DETECTED Final   Enteropathogenic E coli (EPEC) NOT DETECTED NOT DETECTED Final   Enterotoxigenic E coli (ETEC) NOT DETECTED NOT DETECTED Final   Shiga like toxin producing E coli (STEC) NOT DETECTED NOT DETECTED Final   E. coli O157 NOT DETECTED NOT DETECTED Final   Shigella/Enteroinvasive E coli (EIEC) NOT DETECTED NOT DETECTED Final   Cryptosporidium NOT DETECTED NOT DETECTED Final   Cyclospora cayetanensis NOT DETECTED NOT DETECTED Final   Entamoeba histolytica NOT DETECTED NOT DETECTED Final  Giardia lamblia NOT DETECTED NOT DETECTED Final   Adenovirus F40/41 NOT DETECTED NOT DETECTED Final   Astrovirus NOT DETECTED NOT DETECTED Final   Norovirus GI/GII NOT DETECTED NOT DETECTED Final   Sheila [name] NOT DETECTED NOT  DETECTED Final   Sapovirus (I, II, IV, and V) NOT DETECTED NOT DETECTED Final    Comment: Performed at [facility], [place] [place], [place] [address]  MRSA PCR Screening     Status: None   Collection Time: 12/30/17 12:00 PM  Result Value Ref Range Status   MRSA by PCR NEGATIVE NEGATIVE Final    Comment:        The GeneXpert MRSA Assay (FDA approved for NASAL specimens only), is one component of Sheila [name] comprehensive MRSA colonization surveillance program. It is not intended to diagnose MRSA infection nor to guide or monitor treatment for MRSA infections. Performed at [facility], [place] [address], [place], [place] [address]          Radiology Studies: No results found.      Scheduled Meds: . benzonatate  100 mg Oral TID  . Chlorhexidine Gluconate Cloth  6 each Topical Daily  . cholecalciferol  2,000 Units Oral Daily  . fluticasone  1 spray Each Nare Daily  . mouth rinse  15 mL Mouth Rinse BID  . mirtazapine  30 mg Oral QHS   Continuous Infusions: . sodium chloride    . piperacillin-tazobactam (ZOSYN)  IV 3.375 g (01/04/18 1345)  . vancomycin Stopped (01/04/18 1120)     LOS: 7 days    Time spent: over 20 min    Fayrene Helper, MD [facility] Pager [phone]  If 7PM-7AM, please contact night-coverage [email or web address] Password [email or web address] 01/04/2018, 4:02 PM

## 2018-01-05 LAB — BASIC METABOLIC PANEL
Anion gap: 9 (ref 5–15)
BUN: 9 mg/dL (ref 6–20)
CHLORIDE: 109 mmol/L (ref 101–111)
CO2: 25 mmol/L (ref 22–32)
Calcium: 9 mg/dL (ref 8.9–10.3)
Creatinine, Ser: 0.89 mg/dL (ref 0.44–1.00)
GFR calc Af Amer: >60 mL/min (ref >60)
GFR calc non Af Amer: >60 mL/min (ref >60)
GLUCOSE: 107 mg/dL — AB (ref 65–99)
POTASSIUM: 3.9 mmol/L (ref 3.5–5.1)
Sodium: 143 mmol/L (ref 135–145)

## 2018-01-05 LAB — CBC
HEMATOCRIT: 36.9 % (ref 36.0–46.0)
HEMOGLOBIN: 11.5 g/dL — AB (ref 12.0–15.0)
MCH: 30 pg (ref 26.0–34.0)
MCHC: 31.2 g/dL (ref 30.0–36.0)
MCV: 96.3 fL (ref 78.0–100.0)
Platelets: 161 10*3/uL (ref 150–400)
RBC: 3.83 MIL/uL — AB (ref 3.87–5.11)
RDW: 17.6 % — ABNORMAL HIGH (ref 11.5–15.5)
WBC: 3.3 10*3/uL — ABNORMAL LOW (ref 4.0–10.5)

## 2018-01-05 LAB — MAGNESIUM: Magnesium: 1.9 mg/dL (ref 1.7–2.4)

## 2018-01-05 MED ORDER — HEPARIN SOD (PORK) LOCK FLUSH 100 UNIT/ML IV SOLN: 500.0000 [IU] | Freq: Once | INTRAVENOUS | Status: DC

## 2018-01-05 MED ORDER — HEPARIN SOD (PORK) LOCK FLUSH 100 UNIT/ML IV SOLN
500.0000 [IU] | Freq: Once | INTRAVENOUS | Status: DC
  Filled 2018-01-05: qty 5

## 2018-01-05 NOTE — Discharge Summary (Signed)
Physician Discharge Summary  Sheila [name] YYQ:[id number] DOB: 24-Nov-1975 DOA: 12/28/2017  PCP: [name], MD  Admit date: 12/28/2017 Discharge date: 01/05/2018  Time spent: over 30 minutes  Recommendations for Outpatient Follow-up:  1. Follow up outpatient CBC/CMP 2. Follow outpatient BP's.  Soft on d/c, but MAP appropriate, holding home carvedilol.   3. Follow up with oncology regarding repeat imaging (repeat chest CT in 4-6 weeks), possible metastasis  (sclerotic lesions and RML opacity - see imaging below)  Discharge Diagnoses:  Principal Problem:   Multifocal pneumonia Active Problems:   Malignant neoplasm of upper-outer quadrant of right breast in female, estrogen receptor positive (HCC)   Sepsis ([place])   Lactic acidosis   Lung mass   Diarrhea   Sinus tachycardia   Influenza A   Fever due to infection   Hypoxia   Discharge Condition: stable  Diet recommendation: heart healthy  Filed Weights   12/28/17 1051 12/28/17 1655 12/30/17 1200  Weight: 77.1 kg (170 lb) 78.8 kg (173 lb 12.8 oz) 78.1 kg (172 lb 2.9 oz)    History of present illness:  42 year old female with history of breast cancer (oncologist is Dr.Feng), ER, PR positive, H ER 2+, status post bilateral mastectomies and on adjuvant chemotherapy withTCHPlast 2 weeks ago, sinus tachycardia being evaluated by cardiology as an outpatient, presented to the [facility] flulike symptoms. Her husband was recently hospitalized with influenza, she was diagnosed with the same and was admitted to the hospital with sepsis due to influenza.  Initially treated with tamiflu and zosyn (for concern for intraabdominal infection due to diarrhea), but pt with syncopal episode on 2/12 thought 2/2 sepsis from superimposed bacterial pneumonia.  She was broadened to vancomycin and ID was consulted.  She's gradually improved over past few days, completing course of abx on 2/18 on the day of discharge.   Hospital Course:  Sepsis  (HCC)secondary to influenza A  Acute Hypoxic Respiratory Failure 2/2 multifocal pneumonia -Patient metsepsiscriteria at the time of admission with fever, tachycardia, lactic acidosis, dehydration, possibly due to influenza versus colitis. - CT of chest 2/12 with "multifocal/patchy opacities in RUL, RML, and bilateral lower lobes, suspicious for multifocal pneumonia" (see report - f/u imaging recommended) -InfluenzaAwas positive -Completed tamiflu -zosyn (2/10-18) and vanc (2/13- 18) for presumed superimposed bacterial pneumonia - sputum cx pending, blood cx NGTD - Consulted ID for assistance given worsening clinical status (appreciate recs, continue zosyn/vanc until 2/18)  Hypotension: still occasionally soft.  Appropriate at d/c.  Follow.  - MAP goal 65, will continue to monitor  Anemia:  Improved after transfusion 2/15.  Continue to monitor.  Initial transfusion held due to fever, transfusion panel without lab evidence of hemolytic tranfusion reaction. S/p 2 units  2/14 S/p partial transfusion 2/13 Stable  Thrombocytopenia: 2/2 sepsis, malignancy, chemo.  Continue to monitor.  As low as 81 on 2/1.   Improved.    Leukopenia: follow   Hypocalcemia: Improved .  Hypokalemia  Hypomagnesemia: improved  Urinary retention: with >1 L in bladder 2/13.  Foley in place.  Discontinue with trial of void and bladder scans today.  Notes some urinary frequency s/p catheter removal, notes this has happened before, improving at discharge.  Discussed if persistent or she notes dysuria to follow for UA and cx.   NAGMA: improved  Syncopal episode -Patient had a syncopal episode and passed out while standing up 2/12 in the hospital room.  Pt reports to me that she was using the bathroom at this time. Nurse tech was  at bedside and guided patient to the floor, CODE BLUE was called. There are no reports thatpatient lost pulse. She has little recollection of these events. She was  transferred back to bed, where she returned to normal. She was evaluated at bedside, and was alert and oriented x4, vital signs were stable however, but she had new oxygen requirement. EKG non ischemic.  CTA negative for PE, but with multifocal pneumonia as above.  Head CT with and without contrast without acute intracranial abnormality or enhancing lesion.  Suspect this was vasovagal or secondary to orthostasis in setting of sepsis or increased vagal tone when using bathroom (suspect this was due to sepsis with superimposed bacterial pneumonia).   Nausea, vomiting, diarrhea - improved -C. difficile negative and started on Lomotil -likely due to influenza, continue supportive management, continue IV fluids  Lung mass,newmetastasis onCT chest inthe setting of known history of breast cancer -CT chest on 2/10 showed 2.3x2.7x1.2 cm irregular mass like opacity in the right middle lobe worrisome for malignancy/metastasis, 3 new small sclerotic lesions in the sternum and body of T4 -CT abdomen pelviswithout acute findings - CTA on 2/12 notes patchy/nodular opacity may be related to multifocal infection, but underlying tumor not excluded -> follow up imaging in 4-6 weeks  Sinus tachycardia - currently holding home coreg due to BP, ctm -Likely worsened due to #1 (holding coreg for now with soft BP's and syncope 2/12) -2D echo 2/1 showed EF of 60%, no regional wall motion abnormalities.  Notable for GLS -15%. -TSH 0.692, follows Dr. Aundra Dubin  Procedures:  none   Consultations:  Oncology  Infectious disease  Discharge Exam: Vitals:   01/05/18 1545 01/05/18 1637  BP: (!) 90/58 95/60  Pulse:  (!) 101  Resp:    Temp:    SpO2:  99%   Ready to go home.  General: No acute distress. Cardiovascular: Heart sounds show a regular rate, and rhythm. No gallops or rubs. No murmurs. No JVD. Lungs: Clear to auscultation bilaterally with good air movement. No rales, rhonchi or  wheezes. Abdomen: Soft, nontender, nondistended with normal active bowel sounds. No masses. No hepatosplenomegaly. Neurological: Alert and oriented 3. Moves all extremities 4. Cranial nerves II through XII grossly intact. Skin: Warm and dry. No rashes or lesions. Extremities: No clubbing or cyanosis. No edema. Psychiatric: Mood and affect are normal. Insight and judgment are appropriate.  Discharge Instructions   Discharge Instructions    Call MD for:  difficulty breathing, headache or visual disturbances   Complete by:  As directed    Call MD for:  extreme fatigue   Complete by:  As directed    Call MD for:  persistant dizziness or light-headedness   Complete by:  As directed    Call MD for:  persistant nausea and vomiting   Complete by:  As directed    Call MD for:  severe uncontrolled pain   Complete by:  As directed    Call MD for:  temperature >100.4   Complete by:  As directed    Diet - low sodium heart healthy   Complete by:  As directed    Discharge instructions   Complete by:  As directed    You were seen for the flu and developed bacterial pneumonia as well.  We treated you with tamiflu and antibiotics and you improved.    Please follow up with your PCP and your oncologist.  Please follow up with Dr. Marigene Ehlers from cardiology as well.  We held your  carvedilol because of low blood pressures here.  Please follow up with Dr. Marigene Ehlers or your PCP to determine when you can restart this.  You will need follow up imaging with Dr. Burr Medico.   Your blood pressures have been a bit low here.  Change positions slowly.  Please return to care if you notice any lightheadedness or dizziness or feeling faint.  You have recently been asymptomatic with the low blood pressures, so I think this is ok unless you develop any symptoms, but please follow up as noted above with your PCP and cardiology.   Return for new, recurrent, or worsening symptoms.  Please have your PCP request records from this  hospitalization so they know what was done and what the next steps are.   Increase activity slowly   Complete by:  As directed      Allergies as of 01/05/2018   No Known Allergies     Medication List    STOP taking these medications   carvedilol 3.125 MG tablet Commonly known as:  COREG     TAKE these medications   dexamethasone 4 MG tablet Commonly known as:  DECADRON Take 2 tablets (8 mg total) by mouth 2 (two) times daily. Start the day before Taxotere. Then again the day after chemo for 3 days.   diphenoxylate-atropine 2.5-0.025 MG tablet Commonly known as:  LOMOTIL Take 1-2 tablets by mouth 4 (four) times daily as needed for diarrhea or loose stools.   fluticasone 50 MCG/ACT nasal spray Commonly known as:  FLONASE Place 1 spray into both nostrils daily.   lidocaine-prilocaine cream Commonly known as:  EMLA Apply to affected area once What changed:    how much to take  how to take this  when to take this  reasons to take this  additional instructions   loperamide 2 MG capsule Commonly known as:  IMODIUM Take 1 capsule (2 mg total) by mouth 3 (three) times daily as needed for diarrhea or loose stools.   LORazepam 0.5 MG tablet Commonly known as:  ATIVAN Take 1 tablet (0.5 mg total) by mouth every 8 (eight) hours as needed for anxiety or sleep (or nausea). What changed:  when to take this   mirtazapine 30 MG tablet Commonly known as:  REMERON Take 30 mg by mouth at bedtime.   naproxen sodium 220 MG tablet Commonly known as:  ALEVE Take 220 mg 2 (two) times daily as needed by mouth (pain).   prochlorperazine 10 MG tablet Commonly known as:  COMPAZINE Take 1 tablet (10 mg total) by mouth every 6 (six) hours as needed (Nausea or vomiting).   promethazine 12.5 MG tablet Commonly known as:  PHENERGAN Take 1 tablet (12.5 mg total) every 6 (six) hours as needed by mouth for nausea or vomiting.   Vitamin D 2000 units tablet Take 2,000 Units by mouth  daily.      No Known Allergies    The results of significant diagnostics from this hospitalization (including imaging, microbiology, ancillary and laboratory) are listed below for reference.    Significant Diagnostic Studies: Dg Chest 2 View  Result Date: 12/28/2017 CLINICAL DATA:  Cough and fever for 1 day. History of breast cancer. EXAM: CHEST  2 VIEW COMPARISON:  09/23/2017 chest radiograph and prior studies FINDINGS: The cardiomediastinal silhouette is unremarkable. A right Port-A-Cath is again identified with tip overlying the lower SVC. A 1.2 x 2 cm nodular opacity overlying the mid right lung is identified. The lungs are otherwise clear. There  is no evidence of pleural effusion or pneumothorax. IMPRESSION: 1.2 x 2 cm nodular opacity overlying the mid right lung. Given this patient's history of breast cancer, chest CT with contrast is recommended. No evidence of focal airspace disease/pneumonia. Electronically Signed   By: Margarette Canada M.D.   On: 12/28/2017 11:25   Ct Head W & Wo Contrast  Result Date: 12/30/2017 CLINICAL DATA:  History of breast cancer on chemotherapy. Altered mental status. EXAM: CT HEAD WITHOUT AND WITH CONTRAST TECHNIQUE: Contiguous axial images were obtained from the base of the skull through the vertex without and with intravenous contrast CONTRAST:  13m ISOVUE-370 IOPAMIDOL (ISOVUE-370) INJECTION 76% COMPARISON:  None. FINDINGS: Brain: No mass lesion, intraparenchymal hemorrhage or extra-axial collection. No evidence of acute cortical infarct. Brain parenchyma and CSF-containing spaces are normal for age. No contrast-enhancing lesions. Vascular: No hyperdense vessel or unexpected calcification. Skull: Normal visualized skull base, calvarium and extracranial soft tissues. Sinuses/Orbits: Moderate right maxillary sinus mucosal thickening. Normal orbits. IMPRESSION: No acute intracranial abnormality or enhancing lesion. Electronically Signed   By: KUlyses JarredM.D.   On:  12/30/2017 17:28   Ct Chest W Contrast  Result Date: 12/28/2017 CLINICAL DATA:  42year old female with cough and fever. Nodular opacity overlying the right mid lung on chest radiograph today. History of right breast cancer and bilateral mastectomies. EXAM: CT CHEST WITH CONTRAST TECHNIQUE: Multidetector CT imaging of the chest was performed during intravenous contrast administration. CONTRAST:  75 cc intravenous Isovue-300 COMPARISON:  12/28/2017 and prior chest radiographs. 07/02/2017 chest CT FINDINGS: Cardiovascular: Normal heart size. A right IJ central venous catheter is noted with tip at the superior cavoatrial junction. There is no evidence of thoracic aortic aneurysm. No pericardial effusion. Mediastinum/Nodes: No enlarged mediastinal, hilar, or axillary lymph nodes. Calcified right hilar lymph nodes are again noted. Thyroid gland, trachea, and esophagus demonstrate no significant findings. Lungs/Pleura: A 2.3 x 2.7 x 1.2 cm irregular masslike opacity within the right middle lobe is noted and contains 2 slightly coarse calcifications. This is suspicious for malignancy/metastasis but could represent unusual inflammatory or infectious process. No other pulmonary nodules or masses are identified. No airspace disease, pleural effusion or pneumothorax noted. Upper Abdomen: No acute or significant abnormalities. Musculoskeletal: New sclerotic lesions are noted as follows and worrisome for metastatic disease: A 3 mm right sternal/manubrial lesion (7:52) A 1 mm sternal lesion (7:71) A 3 mm lesion within the body of T4 (7:45) IMPRESSION: 1. 2.3 x 2.7 x 1.2 cm irregular masslike opacity within the right middle lobe worrisome for malignancy/metastasis. 2. 3 new very small sclerotic lesions, within the sternum and the body of T4, worrisome for new metastases. Electronically Signed   By: JMargarette CanadaM.D.   On: 12/28/2017 13:14   Ct Angio Chest Pe W Or Wo Contrast  Result Date: 12/30/2017 CLINICAL DATA:  Breast  cancer, status post bilateral mastectomy, on adjuvant chemotherapy. Admitted hospital with sepsis secondary to influenza. EXAM: CT ANGIOGRAPHY CHEST WITH CONTRAST TECHNIQUE: Multidetector CT imaging of the chest was performed using the standard protocol during bolus administration of intravenous contrast. Multiplanar CT image reconstructions and MIPs were obtained to evaluate the vascular anatomy. CONTRAST:  1035mISOVUE-370 IOPAMIDOL (ISOVUE-370) INJECTION 76% COMPARISON:  Chest radiographs dated 12/30/2017. CT chest dated 12/28/2017. FINDINGS: Cardiovascular: Satisfactory opacification of the bilateral pulmonary arteries to the segmental level. No evidence of pulmonary embolism. The heart is normal in size.  No pericardial effusion. No evidence of thoracic aortic aneurysm or dissection. Right chest port terminates at  the cavoatrial junction. Mediastinum/Nodes: Small mediastinal lymph nodes, including an 8 mm short axis low right paratracheal node. No suspicious hilar lymphadenopathy. Status post right axillary lymph node dissection. Lungs/Pleura: Focal/patchy opacities in the medial bilateral lower lobes (series 6/image 84), suspicious for pneumonia, less likely atelectasis. Tree-in-bud nodular opacities in the right upper lobe (series 6/image 59, compatible with multifocal pneumonia. Additional focal/patchy opacity in the lateral right middle lobe (series 6/image 72), similar to recent CT, possibly reflecting an additional site of infection. However, underlying tumor is not excluded. Trace bilateral pleural effusions. No pneumothorax. Upper Abdomen: Visualized upper abdomen is grossly unremarkable. Musculoskeletal: Status post bilateral mastectomy. Stable tiny sclerotic lesions in the sternum and T4 vertebral body. Review of the MIP images confirms the above findings. IMPRESSION: No evidence of pulmonary embolism. Multifocal/patchy opacities in the right upper lobe, right middle lobe, and bilateral lower lobes,  suspicious for multifocal pneumonia. Possible superimposed bilateral lower lobe atelectasis. Trace bilateral pleural effusions. Focal patchy/nodular opacity in the lateral right middle lobe, unchanged from recent CT, possibly related to the patient's multifocal infection. However, underlying tumor is not excluded. Consider follow-up CT chest in 4-6 weeks. Status post bilateral mastectomy with right axillary lymph node dissection. Electronically Signed   By: Julian Hy M.D.   On: 12/30/2017 17:33   Ct Abdomen Pelvis W Contrast  Result Date: 12/29/2017 CLINICAL DATA:  Abdominal pain, history of RIGHT breast cancer stage IB (ER/PR positive, HER2 Neu positive), sinus tachycardia, development of fever, headache and coughing last week, husband had the flu, generalized aches, nausea, vomiting and diarrhea EXAM: CT ABDOMEN AND PELVIS WITH CONTRAST TECHNIQUE: Multidetector CT imaging of the abdomen and pelvis was performed using the standard protocol following bolus administration of intravenous contrast. Sagittal and coronal MPR images reconstructed from axial data set. CONTRAST:  179m ISOVUE-300 IOPAMIDOL (ISOVUE-300) INJECTION 61% IV. Patient refused oral contrast. COMPARISON:  09/05/2017 FINDINGS: Lower chest: Minimal bibasilar atelectasis Hepatobiliary: Tiny questionable low-attenuation focus RIGHT lobe liver image 9. Gallbladder and liver otherwise normal appearance Pancreas: Normal appearance Spleen: Normal appearance Adrenals/Urinary Tract: Small LEFT renal cyst. Adrenal glands, kidneys, ureters, and bladder otherwise normal appearance Stomach/Bowel: Normal appendix. Stomach and bowel loops normal appearance for technique Vascular/Lymphatic: Vascular structures unremarkable. No adenopathy. Reproductive: Uterus and adnexa normal appearance Other: No free air or free fluid. No acute inflammatory process identified. 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 IMPRESSION: No definite acute intra-abdominal or intrapelvic abnormalities. 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 Electronically Signed   By: MLavonia DanaM.D.   On: 12/29/2017 13:04   Dg Chest Port 1 View  Result Date: 12/30/2017 CLINICAL DATA:  42year old with current history of metastatic breast cancer, presenting with shortness of breath while on chemotherapy. EXAM: PORTABLE CHEST 1 VIEW COMPARISON:  CT chest 12/28/2017, 07/02/2017. Chest x-rays 12/28/2017, 09/23/2017 and earlier. FINDINGS: Cardiac silhouette normal in size for AP portable technique, unchanged. Calcified right hilar lymph nodes and the mass in the right middle lobe are better seen on the recent prior CT. Hazy opacities involving the medial right lung base, not present on the prior examinations. No new pulmonary parenchymal abnormalities elsewhere in either lung. Normal pulmonary vascularity. No pneumothorax. Right jugular Port-A-Cath tip projects over the lower SVC, unchanged. IMPRESSION: 1. Likely developing pneumonia involving the medial right lung base. 2. The previously identified right middle lobe lung mass and the calcified right hilar lymph nodes are  better seen on the recent CT. Electronically Signed   By: Evangeline Dakin M.D.   On: 12/30/2017 14:17    Microbiology: Recent Results (from the past 240 hour(s))  Urine culture     Status: None   Collection Time: 12/28/17  2:21 PM  Result Value Ref Range Status   Specimen Description   Final    URINE, RANDOM Performed at [facility] [address], [place], [place] [address]    Special Requests   Final    NONE Performed at [facility], [place] [address], [place], [place] [address]    Culture   Final    NO GROWTH Performed at [facility], [place] [address] [place], [place] [address]    Report Status 12/30/2017 FINAL  Final  Blood culture (routine x 2)      Status: None   Collection Time: 12/28/17  2:38 PM  Result Value Ref Range Status   Specimen Description   Final    BLOOD PORTA CATH Performed at [facility], [place] [address] [place], [place] [address]    Special Requests   Final    BOTTLES DRAWN AEROBIC AND ANAEROBIC Blood Culture adequate volume Performed at [facility] [address] [place], [place] [address]    Culture   Final    NO GROWTH 5 DAYS Performed at [facility], [place] [address], [place], [place] [address]    Report Status 01/03/2018 FINAL  Final  Blood culture (routine x 2)     Status: None   Collection Time: 12/28/17  2:38 PM  Result Value Ref Range Status   Specimen Description   Final    BLOOD LEFT ANTECUBITAL Performed at [facility], [place] [address] [place], [place] [address]    Special Requests   Final    BOTTLES DRAWN AEROBIC AND ANAEROBIC Blood Culture adequate volume Performed at [facility] [address], [place], [place] [address]    Culture   Final    NO GROWTH 5 DAYS Performed at [facility], [place] [address], [place],  [address]    Report Status 01/03/2018 FINAL  Final  Respiratory Panel by PCR     Status: Abnormal   Collection Time: 12/28/17  4:32 PM  Result Value Ref Range Status   Adenovirus NOT DETECTED NOT DETECTED Final   Coronavirus 229E NOT DETECTED NOT DETECTED Final   Coronavirus HKU1 NOT DETECTED NOT DETECTED Final   Coronavirus NL63 NOT DETECTED NOT DETECTED Final   Coronavirus OC43 NOT DETECTED NOT DETECTED Final   Metapneumovirus NOT DETECTED NOT DETECTED Final   Rhinovirus / Enterovirus NOT DETECTED NOT DETECTED Final   Influenza A NOT DETECTED NOT DETECTED Final   Influenza A H1 NOT DETECTED NOT DETECTED Final   Influenza A H1 2009 DETECTED (A) NOT DETECTED Final   Influenza A H3 NOT DETECTED NOT DETECTED Final   Influenza B NOT DETECTED NOT DETECTED Final   Parainfluenza Virus 1 NOT DETECTED NOT  DETECTED Final   Parainfluenza Virus 2 NOT DETECTED NOT DETECTED Final   Parainfluenza Virus 3 NOT DETECTED NOT DETECTED Final   Parainfluenza Virus 4 NOT DETECTED NOT DETECTED Final   Respiratory Syncytial Virus NOT DETECTED NOT DETECTED Final   Bordetella pertussis NOT DETECTED NOT DETECTED Final   Chlamydophila pneumoniae NOT DETECTED NOT DETECTED Final   Mycoplasma pneumoniae NOT DETECTED NOT DETECTED Final    Comment: Performed  at [facility], [place] [address], [place], [place] [address]  C difficile quick scan w PCR reflex     Status: None   Collection Time: 12/29/17  5:38 AM  Result Value Ref Range Status   C Diff antigen NEGATIVE NEGATIVE Final   C Diff toxin NEGATIVE NEGATIVE Final   C Diff interpretation No C. difficile detected.  Final    Comment: Performed at [facility], [place] [address] [place], [place] [address]  Gastrointestinal Panel by PCR , Stool     Status: None   Collection Time: 12/29/17  5:38 AM  Result Value Ref Range Status   Campylobacter species NOT DETECTED NOT DETECTED Final   Plesimonas shigelloides NOT DETECTED NOT DETECTED Final   Salmonella species NOT DETECTED NOT DETECTED Final   Yersinia enterocolitica NOT DETECTED NOT DETECTED Final   Vibrio species NOT DETECTED NOT DETECTED Final   Vibrio cholerae NOT DETECTED NOT DETECTED Final   Enteroaggregative E coli (EAEC) NOT DETECTED NOT DETECTED Final   Enteropathogenic E coli (EPEC) NOT DETECTED NOT DETECTED Final   Enterotoxigenic E coli (ETEC) NOT DETECTED NOT DETECTED Final   Shiga like toxin producing E coli (STEC) NOT DETECTED NOT DETECTED Final   E. coli O157 NOT DETECTED NOT DETECTED Final   Shigella/Enteroinvasive E coli (EIEC) NOT DETECTED NOT DETECTED Final   Cryptosporidium NOT DETECTED NOT DETECTED Final   Cyclospora cayetanensis NOT DETECTED NOT DETECTED Final   Entamoeba histolytica NOT DETECTED NOT DETECTED Final   Giardia lamblia NOT DETECTED NOT DETECTED Final    Adenovirus F40/41 NOT DETECTED NOT DETECTED Final   Astrovirus NOT DETECTED NOT DETECTED Final   Norovirus GI/GII NOT DETECTED NOT DETECTED Final   Rotavirus A NOT DETECTED NOT DETECTED Final   Sapovirus (I, II, IV, and V) NOT DETECTED NOT DETECTED Final    Comment: Performed at [facility], [place] [place], [place] [address]  MRSA PCR Screening     Status: None   Collection Time: 12/30/17 12:00 PM  Result Value Ref Range Status   MRSA by PCR NEGATIVE NEGATIVE Final    Comment:        The GeneXpert MRSA Assay (FDA approved for NASAL specimens only), is one component of a comprehensive MRSA colonization surveillance program. It is not intended to diagnose MRSA infection nor to guide or monitor treatment for MRSA infections. Performed at [facility], [place] [address] [place], [place] [address]      Labs: Basic Metabolic Panel: Recent Labs  Lab 01/01/18 0606 01/02/18 0656 01/03/18 0447 01/04/18 0355 01/05/18 0430  NA 145 142 142 141 143  K 2.8* 3.2* 3.8 3.6 3.9  CL 115* 112* 111 108 109  CO2 _0 GLUCOSE 153* 100* 103* 103* 107*  BUN <5* <5* 5* 7 9  CREATININE 0.90 0.81 0.79 0.84 0.89  CALCIUM 7.7* 7.9* 8.6* 8.6* 9.0  MG 1.5* 1.9 1.6* 1.9 1.9   Liver Function Tests: Recent Labs  Lab 12/31/17 0543 01/01/18 0606 01/02/18 0656  ALBUMIN 2.3* 2.4* 2.5*   No results for input(s): LIPASE, AMYLASE in the last 168 hours. No results for input(s): AMMONIA in the last 168 hours. CBC: Recent Labs  Lab 12/31/17 2100 01/01/18 0606 01/02/18 0656 01/03/18 0447 01/04/18 0355 01/05/18 0430  WBC 6.3 5.7 3.6* 3.0* 4.0 3.3*  NEUTROABS 4.6  --   --   --   --   --   HGB 7.9*  7.6* 10.1* 10.7* 11.3* 11.5*  HCT 24.5* 23.2* 30.5* 34.2* 35.8* 36.9  MCV 97.2 96.3 92.1 92.9 94.5 96.3  PLT 84* 77* 82* 113* 129* 161   Cardiac Enzymes: No results for input(s): CKTOTAL, CKMB, CKMBINDEX, TROPONINI in the last 168 hours. BNP: BNP  (last 3 results) No results for input(s): BNP in the last 8760 hours.  ProBNP (last 3 results) No results for input(s): PROBNP in the last 8760 hours.  CBG: Recent Labs  Lab 12/30/17 1125  GLUCAP 148*       Signed:  Fayrene Helper MD.  [facility] 01/05/2018, 7:41 PM

## 2018-01-05 NOTE — Progress Notes (Signed)
Physical Therapy Treatment Patient Details Name: Sheila [name] MRN: [id number] DOB: [birth year] Today's Date: 01/05/2018    History of Present Illness Pt admitted 2* weakness with dx of sepsis 2* influenza and HCAP.  Pt recently completed chemo 2* breast CA and is s/p bilat mastectomy    PT Comments    Pt feeling and moving better.  Negotiating self to and from bed to [facility] without assist.  amb full unit without need for any AD and without need for hand held assist.  Pt plans to D/C to home today.    Follow Up Recommendations  No PT follow up     Equipment Recommendations  None recommended by PT    Recommendations for Other Services       Precautions / Restrictions Precautions Precautions: Fall Precaution Comments: recent B Mastectomy Restrictions Weight Bearing Restrictions: No    Mobility  Bed Mobility Overal bed mobility: Modified Independent             General bed mobility comments: Pt to self assisting off BSC on arrival  Transfers Overall transfer level: Needs assistance Equipment used: None Transfers: Sit to/from Stand;Stand Pivot Transfers Sit to Stand: Supervision Stand pivot transfers: Supervision       General transfer comment: good use of hands to steady self and good safety awareness  Ambulation/Gait Ambulation/Gait assistance: Supervision Ambulation Distance (Feet): 350 Feet Assistive device: None Gait Pattern/deviations: Step-through pattern Gait velocity: decreased but functional   General Gait Details: good alternating gait with only one lateral LOB but self corrected when turning around.  Much improved gait    Stairs            Wheelchair Mobility    Modified Rankin (Stroke Patients Only)       Balance                                            Cognition Arousal/Alertness: Awake/alert                                            Exercises      General Comments         Pertinent Vitals/Pain Pain Assessment: No/denies pain    Home Living                      Prior Function            PT Goals (current goals can now be found in the care plan section) Progress towards PT goals: Progressing toward goals    Frequency    Min 3X/week      PT Plan      Co-evaluation              AM-PAC PT "6 Clicks" Daily Activity  Outcome Measure  Difficulty turning over in bed (including adjusting bedclothes, sheets and blankets)?: None Difficulty moving from lying on back to sitting on the side of the bed? : None Difficulty sitting down on and standing up from a chair with arms (e.g., wheelchair, bedside commode, etc,.)?: None Help needed moving to and from a bed to chair (including a wheelchair)?: None Help needed walking in hospital room?: None Help needed climbing 3-5 steps with a railing? : A Little 6 Click Score:  23    End of Session Equipment Utilized During Treatment: Gait belt Activity Tolerance: Patient tolerated treatment well;Patient limited by fatigue Patient left: in bed;with call bell/phone within reach Nurse Communication: Mobility status PT Visit Diagnosis: Unsteadiness on feet (R26.81);Difficulty in walking, not elsewhere classified (R26.2)     Time: 0106-0119 PT Time Calculation (min) (ACUTE ONLY): 13 min  Charges:  $Gait Training: 8-22 mins                    G Codes:       Rica Koyanagi  PTA [facility]  Acute  Rehab Pager      [phone]

## 2018-01-05 NOTE — Progress Notes (Signed)
Patient given discharge instructions, and verbalized an understanding of all discharge instructions.  Patient agrees with discharge plan, and is being discharged in stable medical condition.  Patient given transportation via wheelchair. 

## 2018-01-06 ENCOUNTER — Ambulatory Visit: Payer: PRIVATE HEALTH INSURANCE

## 2018-01-06 ENCOUNTER — Ambulatory Visit
Admission: RE | Admit: 2018-01-06 | Discharge: 2018-01-06 | Disposition: A | Discharge status: Home / Self Care | Payer: PRIVATE HEALTH INSURANCE | Source: Ambulatory Visit | Attending: Radiation Oncology | Admitting: Radiation Oncology

## 2018-01-06 ENCOUNTER — Telehealth: Payer: Self-pay | Admitting: Hematology

## 2018-01-06 NOTE — Telephone Encounter (Signed)
Left message for patient regarding appointment Date/Time per 2/17 sch msg

## 2018-01-16 ENCOUNTER — Inpatient Hospital Stay: Payer: PRIVATE HEALTH INSURANCE

## 2018-01-16 ENCOUNTER — Inpatient Hospital Stay (HOSPITAL_BASED_OUTPATIENT_CLINIC_OR_DEPARTMENT_OTHER): Payer: PRIVATE HEALTH INSURANCE | Admitting: Nurse Practitioner

## 2018-01-16 ENCOUNTER — Encounter: Payer: Self-pay | Admitting: Nurse Practitioner

## 2018-01-16 ENCOUNTER — Inpatient Hospital Stay: Payer: PRIVATE HEALTH INSURANCE | Attending: Nurse Practitioner

## 2018-01-16 ENCOUNTER — Telehealth: Payer: Self-pay | Admitting: Hematology

## 2018-01-16 VITALS — BP 114/78 | HR 128 | Temp 98.7°F | Resp 20 | Ht 66.0 in | Wt 161.5 lb

## 2018-01-16 VITALS — HR 99

## 2018-01-16 DIAGNOSIS — R5383 Other fatigue: Secondary | ICD-10-CM

## 2018-01-16 DIAGNOSIS — R000 Tachycardia, unspecified: Secondary | ICD-10-CM

## 2018-01-16 DIAGNOSIS — Z1501 Genetic susceptibility to malignant neoplasm of breast: Secondary | ICD-10-CM

## 2018-01-16 DIAGNOSIS — M5137 Other intervertebral disc degeneration, lumbosacral region: Secondary | ICD-10-CM

## 2018-01-16 DIAGNOSIS — Z9011 Acquired absence of right breast and nipple: Secondary | ICD-10-CM

## 2018-01-16 DIAGNOSIS — E559 Vitamin D deficiency, unspecified: Secondary | ICD-10-CM | POA: Diagnosis not present

## 2018-01-16 DIAGNOSIS — D493 Neoplasm of unspecified behavior of breast: Secondary | ICD-10-CM

## 2018-01-16 DIAGNOSIS — K219 Gastro-esophageal reflux disease without esophagitis: Secondary | ICD-10-CM | POA: Insufficient documentation

## 2018-01-16 DIAGNOSIS — Z9013 Acquired absence of bilateral breasts and nipples: Secondary | ICD-10-CM | POA: Insufficient documentation

## 2018-01-16 DIAGNOSIS — Z170 Estrogen receptor positive status [ER+]: Secondary | ICD-10-CM

## 2018-01-16 DIAGNOSIS — F419 Anxiety disorder, unspecified: Secondary | ICD-10-CM

## 2018-01-16 DIAGNOSIS — Z95828 Presence of other vascular implants and grafts: Secondary | ICD-10-CM

## 2018-01-16 DIAGNOSIS — E7800 Pure hypercholesterolemia, unspecified: Secondary | ICD-10-CM | POA: Insufficient documentation

## 2018-01-16 DIAGNOSIS — Z8701 Personal history of pneumonia (recurrent): Secondary | ICD-10-CM | POA: Diagnosis not present

## 2018-01-16 DIAGNOSIS — Z79899 Other long term (current) drug therapy: Secondary | ICD-10-CM | POA: Diagnosis not present

## 2018-01-16 DIAGNOSIS — Z5112 Encounter for antineoplastic immunotherapy: Secondary | ICD-10-CM | POA: Diagnosis not present

## 2018-01-16 DIAGNOSIS — G4700 Insomnia, unspecified: Secondary | ICD-10-CM | POA: Insufficient documentation

## 2018-01-16 DIAGNOSIS — C50411 Malignant neoplasm of upper-outer quadrant of right female breast: Secondary | ICD-10-CM | POA: Insufficient documentation

## 2018-01-16 DIAGNOSIS — Z9221 Personal history of antineoplastic chemotherapy: Secondary | ICD-10-CM

## 2018-01-16 DIAGNOSIS — R232 Flushing: Secondary | ICD-10-CM | POA: Insufficient documentation

## 2018-01-16 DIAGNOSIS — R630 Anorexia: Secondary | ICD-10-CM

## 2018-01-16 DIAGNOSIS — R12 Heartburn: Secondary | ICD-10-CM | POA: Insufficient documentation

## 2018-01-16 DIAGNOSIS — Z803 Family history of malignant neoplasm of breast: Secondary | ICD-10-CM | POA: Insufficient documentation

## 2018-01-16 DIAGNOSIS — R110 Nausea: Secondary | ICD-10-CM

## 2018-01-16 LAB — CBC WITH DIFFERENTIAL/PLATELET
BASOS PCT: 1 %
Basophils Absolute: 0 10*3/uL (ref 0.0–0.1)
Eosinophils Absolute: 0 10*3/uL (ref 0.0–0.5)
Eosinophils Relative: 1 %
HEMATOCRIT: 40.2 % (ref 34.8–46.6)
HEMOGLOBIN: 13.1 g/dL (ref 11.6–15.9)
LYMPHS ABS: 1 10*3/uL (ref 0.9–3.3)
Lymphocytes Relative: 16 %
MCH: 30.8 pg (ref 25.1–34.0)
MCHC: 32.5 g/dL (ref 31.5–36.0)
MCV: 94.6 fL (ref 79.5–101.0)
MONO ABS: 0.4 10*3/uL (ref 0.1–0.9)
MONOS PCT: 7 %
NEUTROS ABS: 4.7 10*3/uL (ref 1.5–6.5)
Neutrophils Relative %: 75 %
Platelets: 218 10*3/uL (ref 145–400)
RBC: 4.25 MIL/uL (ref 3.70–5.45)
RDW: 17.6 % — AB (ref 11.2–14.5)
WBC: 6.2 10*3/uL (ref 3.9–10.3)

## 2018-01-16 LAB — COMPREHENSIVE METABOLIC PANEL
ALBUMIN: 3.7 g/dL (ref 3.5–5.0)
ALK PHOS: 63 U/L (ref 40–150)
ALT: 12 U/L (ref 0–55)
AST: 11 U/L (ref 5–34)
Anion gap: 11 (ref 3–11)
BUN: 11 mg/dL (ref 7–26)
CALCIUM: 9.9 mg/dL (ref 8.4–10.4)
CHLORIDE: 106 mmol/L (ref 98–109)
CO2: 24 mmol/L (ref 22–29)
Creatinine, Ser: 0.91 mg/dL (ref 0.60–1.10)
GFR calc Af Amer: >60 mL/min (ref >60)
GFR calc non Af Amer: >60 mL/min (ref >60)
GLUCOSE: 138 mg/dL (ref 70–140)
Potassium: 3.9 mmol/L (ref 3.5–5.1)
SODIUM: 141 mmol/L (ref 136–145)
Total Bilirubin: 0.5 mg/dL (ref 0.2–1.2)
Total Protein: 7 g/dL (ref 6.4–8.3)

## 2018-01-16 MED ORDER — ACETAMINOPHEN 325 MG PO TABS
650.0000 mg | ORAL_TABLET | Freq: Once | ORAL | Status: AC
  Administered 2018-01-16: 650 mg via ORAL

## 2018-01-16 MED ORDER — ACETAMINOPHEN 325 MG PO TABS
ORAL_TABLET | ORAL | Status: AC
  Filled 2018-01-16: qty 2

## 2018-01-16 MED ORDER — SODIUM CHLORIDE 0.9 % IV SOLN
420.0000 mg | Freq: Once | INTRAVENOUS | Status: AC
  Administered 2018-01-16: 420 mg via INTRAVENOUS
  Filled 2018-01-16: qty 14

## 2018-01-16 MED ORDER — TRASTUZUMAB CHEMO 150 MG IV SOLR
450.0000 mg | Freq: Once | INTRAVENOUS | Status: AC
  Administered 2018-01-16: 450 mg via INTRAVENOUS
  Filled 2018-01-16: qty 21.43

## 2018-01-16 MED ORDER — SODIUM CHLORIDE 0.9% FLUSH
10.0000 mL | INTRAVENOUS | Status: DC | PRN
  Administered 2018-01-16: 10 mL
  Filled 2018-01-16: qty 10

## 2018-01-16 MED ORDER — LORAZEPAM 0.5 MG PO TABS: 0.5000 mg | ORAL_TABLET | Freq: Three times a day (TID) | ORAL | 0 refills | Status: DC | PRN

## 2018-01-16 MED ORDER — GABAPENTIN 100 MG PO CAPS: 100.0000 mg | ORAL_CAPSULE | Freq: Every day | ORAL | 0 refills | Status: DC

## 2018-01-16 MED ORDER — SODIUM CHLORIDE 0.9% FLUSH
10.0000 mL | Freq: Once | INTRAVENOUS | Status: AC
  Administered 2018-01-16: 10 mL
  Filled 2018-01-16: qty 10

## 2018-01-16 MED ORDER — DIPHENHYDRAMINE HCL 25 MG PO CAPS
ORAL_CAPSULE | ORAL | Status: AC
  Filled 2018-01-16: qty 2

## 2018-01-16 MED ORDER — HEPARIN SOD (PORK) LOCK FLUSH 100 UNIT/ML IV SOLN
500.0000 [IU] | Freq: Once | INTRAVENOUS | Status: AC | PRN
  Administered 2018-01-16: 500 [IU]
  Filled 2018-01-16: qty 5

## 2018-01-16 MED ORDER — SODIUM CHLORIDE 0.9 % IV SOLN
Freq: Once | INTRAVENOUS | Status: AC
  Administered 2018-01-16: 12:00:00 via INTRAVENOUS

## 2018-01-16 MED ORDER — SODIUM CHLORIDE 0.9 % IV SOLN
Freq: Once | INTRAVENOUS | Status: AC
  Administered 2018-01-16: 13:00:00 via INTRAVENOUS

## 2018-01-16 MED ORDER — DIPHENHYDRAMINE HCL 25 MG PO CAPS
50.0000 mg | ORAL_CAPSULE | Freq: Once | ORAL | Status: AC
  Administered 2018-01-16: 50 mg via ORAL

## 2018-01-16 NOTE — Patient Instructions (Signed)
[facility] Discharge Instructions for Patients Receiving Chemotherapy  Today you received the following chemotherapy agents: Herceptin and Perjeta.  To help prevent nausea and vomiting after your treatment, we encourage you to take your nausea medication as prescribed.   If you develop nausea and vomiting that is not controlled by your nausea medication, call the clinic.   BELOW ARE SYMPTOMS THAT SHOULD BE REPORTED IMMEDIATELY:  *FEVER GREATER THAN 100.5 F  *CHILLS WITH OR WITHOUT FEVER  NAUSEA AND VOMITING THAT IS NOT CONTROLLED WITH YOUR NAUSEA MEDICATION  *UNUSUAL SHORTNESS OF BREATH  *UNUSUAL BRUISING OR BLEEDING  TENDERNESS IN MOUTH AND THROAT WITH OR WITHOUT PRESENCE OF ULCERS  *URINARY PROBLEMS  *BOWEL PROBLEMS  UNUSUAL RASH Items with * indicate a potential emergency and should be followed up as soon as possible.  Feel free to call the clinic should you have any questions or concerns. The clinic phone number is [phone].  Please show the CHEMO ALERT CARD at check-in to the Emergency Department and triage nurse.   

## 2018-01-16 NOTE — Telephone Encounter (Signed)
Appointments scheduled Calendar/ Letter mailed to patient per 3/1 los

## 2018-01-16 NOTE — Progress Notes (Addendum)
[place]  [phone] [phone]  Clinic Follow up Note   Patient Care Team: [name], MD as PCP - General (Family Medicine) [name] (Family Medicine) [name], MD as Consulting Physician (General Surgery) [name], MD as Consulting Physician (Hematology) Kyung Rudd, MD as Consulting Physician (Radiation Oncology) 01/16/2018  SUMMARY OF ONCOLOGIC HISTORY: Oncology History    Cancer Staging Malignant neoplasm of upper-outer quadrant of right breast in female, estrogen receptor positive ([place]) Staging form: Breast, AJCC 8th Edition - Clinical stage from 06/11/2017: Stage IB (cT3, cN0, cM0, G2, ER: Positive, PR: Positive, HER2: Positive) - Signed by [name], MD on 06/16/2017 - Pathologic stage from 07/22/2017: Stage IB (pT3(m), pN2a(sn), cM0, G2, ER: Positive, PR: Positive, HER2: Positive) - Signed by [name], NP on 08/04/2017      Malignant neoplasm of upper-outer quadrant of right breast in female, estrogen receptor positive ([place])   06/11/2017 Initial Biopsy    Diagnosis 06/11/17 Breast, right, needle core biopsy, 11:00 o'clock - INVASIVE DUCTAL CARCINOMA, G2      06/11/2017 Mammogram    [name] and [facility] Diagnostic Breast Tomo Bilateral 06/11/17 IMPRESSION: 1. Highly suspicious mass within the retroareolar right breast, extending from the 8:00 to 11:00 axes, anterior to posterior depth, extending anteriorly to the nipple with associated nipple retraction, measuring at least 5.5 cm by ultrasound  2. No sonographic evidence of metastatic lymphadenopathy in the right axilla. 3. No evidence of malignancy within the left breast.       06/11/2017 Initial Diagnosis    Malignant neoplasm of upper-outer quadrant of right breast in female, estrogen receptor positive ([place])      06/11/2017 Receptors her2    Estrogen Receptor: 100%, POSITIVE, STRONG STAINING INTENSITY Progesterone Receptor: 100%, POSITIVE, STRONG STAINING  INTENSITY Proliferation Marker Ki67: 12%Proliferation Marker Ki67: 12%  HER2 - **POSITIVE** RATIO OF HER2/CEP17 SIGNALS 2.55 AVERAGE HER2 COPY NUMBER PER CELL 4.85      06/25/2017 Genetic Testing    Negative genetic testing on the 9 gene STAT panel.  The STAT Breast cancer panel offered by [facility] includes sequencing and rearrangement analysis for the following 9 genes:  ATM, BRCA1, BRCA2, CDH1, CHEK2, PALB2, PTEN, STK11 and TP53.   The report date is June 25, 2017.  Negative genetic testing on the common hereditary cancer panel.  The Hereditary Gene Panel offered by [facility] includes sequencing and/or deletion duplication testing of the following 46 genes: APC, ATM, AXIN2, BARD1, BMPR1A, BRCA1, BRCA2, BRIP1, CDH1, CDKN2A (p14ARF), CDKN2A (p16INK4a), CHEK2, CTNNA1, DICER1, EPCAM (Deletion/duplication testing only), GREM1 (promoter region deletion/duplication testing only), KIT, MEN1, MLH1, MSH2, MSH3, MSH6, MUTYH, NBN, NF1, NHTL1, PALB2, PDGFRA, PMS2, POLD1, POLE, PTEN, RAD50, RAD51C, RAD51D, SDHB, SDHC, SDHD, SMAD4, SMARCA4. STK11, TP53, TSC1, TSC2, and VHL.  The following genes were evaluated for sequence changes only: SDHA and HOXB13 c.251G>A variant only.  The report date is June 25, 2017.       07/02/2017 Imaging    CT cAP 07/02/17 IMPRESSION: 1. Subareolar right breast mass. No compelling findings of nodal or metastatic involvement. 2. Old granulomatous disease. 3. There is a 7 mm enhancing or hyperdense focus posteriorly in segment 7 of the liver in the subcapsular region. By virtue of its small size this lesion is technically nonspecific although statistically likely to be a small benign lesions such as flash filling hemangioma. This may warrant surveillance. 4.  Prominent stool throughout the colon favors constipation. 5. 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      07/02/2017 Imaging    Bone scan 07/02/17 IMPRESSION: Today' s exam is  negative. I ascribed the tiny focus of activity just proximal to the left antecubital region to injection site.      07/22/2017 Surgery    RIGHT TOTAL MASTECTOMY WITH RIGHT AXILLARY SENTINEL LYMPH NODE BIOPSY, LEFT PROPHYLACTIC MASTECTOM and INSERTION PORT-A-CATH WITH [name] by [name] and [facility]       07/22/2017 Pathology Results    Diagnosis 1. Breast, simple mastectomy, Left - FIBROADENOMA. - NO MALIGNANCY IDENTIFIED. 2. 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 - ADDITIONAL FOCUS OF LOBULAR CARCINOMA, GRADE 2, SPANNING 2.2 CM. - INTERMEDIATE GRADE DUCTAL CARCINOMA IN SITU. - INVASIVE CARCINOMA COMES TO WITHIN 0.2 TO 0.3 CM OF THE DEEP MARGIN, FOCALLY. - TUMOR FOCALLY INVOLVES EPIDERMIS. - LYMPHOVASCULAR INVASION OF DERMAL LYMPHATICS. - ONE OF ONE LYMPH NODES NEGATIVE FOR CARCINOMA (0/1). - SEE ONCOLOGY TABLE. 3. Lymph node, sentinel, biopsy, Right axillary - ONE LYMPH NODE WITH ISOLATED TUMOR CELLS (0/1). 4. Lymph node, sentinel, biopsy, Right - METASTATIC CARCINOMA IN ONE OF ONE LYMPH NODES (1/1). 5. Lymph node, sentinel, biopsy, Right - ONE OF ONE LYMPH NODES NEGATIVE FOR CARCINOMA (0/1). 6. Lymph node, sentinel, biopsy, Right - METASTATIC CARCINOMA IN ONE OF ONE LYMPH NODES (1/1). - EXTRACAPSULAR EXTENSION. 7. Lymph node, sentinel, biopsy, Right - METASTATIC CARCINOMA IN ONE OF ONE LYMPH NODES (1/1). - EXTRACAPSULAR EXTENSION. 8. Lymph node, sentinel, biopsy, Right - ONE OF ONE LYMPH NODES NEGATIVE FOR CARCINOMA (0/1). 9. Lymph node, sentinel, biopsy, Right - ONE OF ONE LYMPH NODES NEGATIVE FOR CARCINOMA (0/1). 10. Lymph node, sentinel, biopsy, Right - METASTATIC CARCINOMA IN ONE OF ONE LYMPH NODES (1/1). - EXTRACAPSULAR EXTENSION. 11. Lymph node, biopsy, Right axillary - ONE OF ONE LYMPH NODES NEGATIVE FOR CARCINOMA (0/1).       07/22/2017 Receptors her2    Ductal carcinoma in primary breast tumor  and node metastasis are ER 95%, PR 95% strongly positive, and HER2 + The lobular component in primary breast tumor is ER 95% positive, PR 95% positive, strong staining, HER-2 negative.       08/25/2017 Imaging    MRI abdomen done at [facility]:  Enhancing structure measing 5 mm is compatible with a flash filling hemangioma. No specific findings to suggest metastatic cancer      08/25/2017 Imaging    MRI Abdomen at [facility] 08/25/17  IMPRESSION: 1. Lesion in segment 7 of the liver has signal and enhancement characteristics compatible with a flash filling hemangioma. No specific findings identified to suggest metastatic disease.      08/29/2017 - 12/12/2017 Chemotherapy    adjuvant TCHP every 3 weeks for 6 cycles starting 08/29/17, followed by maintenance Herceptin with or without Perjeta for 6-12 months. Postponed and decreased Carbo to 670m and added Onpro with cycle 2 on 09/19/17 due to neutropenic fever. Carbplatin further reduced to 5038mstarting with cycle 3 due to thrombocytopenia.          09/04/2017 - 09/09/2017 Hospital Admission    Admit date: 09/04/2017 Discharge date: 09/09/2017  DISCHARGE DIAGNOSES:  Principal Problem:   [facility]:   Malignant neoplasm of upper-outer quadrant of right breast in female, estrogen receptor positive ([facility]  Port-A-Cath in place   Neutropenic fever ([facility]  Hyponatremia   Malignant neoplasm of breast in female, estrogen receptor positive ([facility]  12/28/2017 Imaging    CT CHEST IMPRESSION: 1. 2.3 x 2.7 x 1.2 cm irregular masslike opacity within the right middle lobe worrisome for malignancy/metastasis. 2. 3 new very small sclerotic lesions, within the sternum and the body of T4, worrisome for new metastases.      12/28/2017 - 01/05/2018 Hospital Admission    Admit date: 12/28/17-01/05/18 Admission diagnosis: multifocal pneumonia, influenza A, sepsis, hypoxia Additional comments: imaging during hospitalization notable  for new irregular masslike opacity in the right middle lobe worrisome for metastasis and new very small sclerotic lesion within the sternum and the body of T4, worrisome for metastasis      12/29/2017 Imaging    CT ABD/PELVIS IMPRESSION: No definite acute intra-abdominal or intrapelvic abnormalities.  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      12/30/2017 Imaging    CT HEAD IMPRESSION: No acute intracranial abnormality or enhancing lesion.     CURRENT THERAPY: completed 6 cycles TCHP 08/29/17 - 12/12/17; on maintenance anti-HER2 therapy with herceptin/perjeta    INTERVAL HISTORY: Sheila [name] returns for follow-up as scheduled prior to next cycle maintenance Herceptin/perjeta.  She completed adjuvant chemo TC on 12/12/17. She was hospitalized following treatment from 2/10 - 2/18 for sepsis, multifocal pneumonia, influenza A, and hypoxia. She was discharged on 01/05/18. Denies recent fever, chills, cough, dyspnea, chest pain, or palpitations. She remains weak and fatigued but improving gradually. Po intake is improving; drinking better than she is eating. No mucositis. Has lost some weight since prior to hospitalization. Denies nausea, vomiting, constipation, or diarrhea. Has 1 daily loose BM, controlled with 1 lomotil per day. Denies blood in stool. Occasional numbness to toes that developed during chemo is improving, no affecting balance or gait. She began having mild hot flashes during chemotherapy that increased in frequency over last 2-3 weeks, sometimes 20 per day lasting 5-10 minutes each. Appears to interfere with her quality of life and affects her sleep.   REVIEW OF SYSTEMS:   Constitutional: Denies fevers, chills (+) abnormal weight loss (+) moderate fatigue, improving (+) decreased appetite  Eyes: Denies blurriness of vision Ears, nose, mouth, throat, and face: Denies mucositis or sore throat Respiratory: Denies cough, dyspnea or  wheezes Cardiovascular: Denies palpitation, chest discomfort or lower extremity swelling Gastrointestinal:  Denies nausea, vomiting, constipation, diarrhea, hematochezia, heartburn or change in bowel habits (+) 1 loose BM per day, controlled with lomotil Skin: Denies abnormal skin rashes Lymphatics: Denies new lymphadenopathy or easy bruising Neurological:Denies tingling (+) generalized weakness, improving (+) occasional numbness to toes, improving (+) frequent hot flashes, approx 20 per day and night lasting 5-10 minutes each Behavioral/Psych: Mood is stable, no new changes (+) insomnia, worsening due to hot flashes  All other systems were reviewed with the patient and are negative.  MEDICAL HISTORY:  Past Medical History:  Diagnosis Date  . Anxiety   . Cancer [facility])    right breast   . Family history of breast cancer   . Family history of colon cancer   . GERD (gastroesophageal reflux disease)   . Headache    couple of migraines in the past  . History of kidney stones   . Vitamin D deficiency     SURGICAL HISTORY: Past Surgical History:  Procedure Laterality Date  . egg donation    . kidney stone removal    . MASTECTOMY W/ SENTINEL NODE BIOPSY Bilateral 07/22/2017   Procedure: RIGHT TOTAL MASTECTOMY WITH RIGHT AXILLARY SENTINEL LYMPH NODE BIOPSY, LEFT PROPHYLACTIC MASTECTOMY;  Surgeon: [name], MD;  Location: [place];  Service: General;  Laterality: Bilateral;  . PORTACATH PLACEMENT Right 07/22/2017   Procedure: INSERTION PORT-A-CATH WITH [name];  Surgeon: [name], MD;  Location: [place];  Service: General;  Laterality: Right;    I have reviewed the social history and family history with the patient and they are unchanged from previous note.  ALLERGIES:  has No Known Allergies.  MEDICATIONS:  Current Outpatient Medications  Medication Sig Dispense Refill  . diphenoxylate-atropine (LOMOTIL) 2.5-0.025 MG tablet Take 1-2 tablets by mouth 4 (four) times daily as  needed for diarrhea or loose stools. 30 tablet 2  . fluticasone (FLONASE) 50 MCG/ACT nasal spray Place 1 spray into both nostrils daily.    Marland Kitchen lidocaine-prilocaine (EMLA) cream Apply to affected area once (Patient taking differently: Apply 1 application topically once as needed (port access). Apply to affected area once) 30 g 3  . loperamide (IMODIUM) 2 MG capsule Take 1 capsule (2 mg total) by mouth 3 (three) times daily as needed for diarrhea or loose stools. 30 capsule 0  . LORazepam (ATIVAN) 0.5 MG tablet Take 1 tablet (0.5 mg total) by mouth every 8 (eight) hours as needed for anxiety or sleep (or nausea). 30 tablet 0  . mirtazapine (REMERON) 30 MG tablet Take 30 mg by mouth at bedtime.    . naproxen sodium (ANAPROX) 220 MG tablet Take 220 mg 2 (two) times daily as needed by mouth (pain).     . prochlorperazine (COMPAZINE) 10 MG tablet Take 1 tablet (10 mg total) by mouth every 6 (six) hours as needed (Nausea or vomiting). 30 tablet 1  . Cholecalciferol (VITAMIN D) 2000 units tablet Take 2,000 Units by mouth daily.    Marland Kitchen dexamethasone (DECADRON) 4 MG tablet Take 2 tablets (8 mg total) by mouth 2 (two) times daily. Start the day before Taxotere. Then again the day after chemo for 3 days. 30 tablet 1  . gabapentin (NEURONTIN) 100 MG capsule Take 1 capsule (100 mg total) by mouth at bedtime. 30 capsule 0  . promethazine (PHENERGAN) 12.5 MG tablet Take 1 tablet (12.5 mg total) every 6 (six) hours as needed by mouth for nausea or vomiting. 30 tablet 2   Current Facility-Administered Medications  Medication Dose Route Frequency Provider Last Rate Last Dose  . gi cocktail (Maalox,Lidocaine,Donnatal)  30 mL Oral Once Harle Stanford., PA-C       Facility-Administered Medications Ordered in Other Visits  Medication Dose Route Frequency Provider Last Rate Last Dose  . sodium chloride flush (NS) 0.9 % injection 10 mL  10 mL Intracatheter PRN [name], MD   10 mL at 01/16/18 1441    PHYSICAL  EXAMINATION: ECOG PERFORMANCE STATUS: 2-3   Vitals:   01/16/18 1123  BP: 114/78  Pulse: (!) 128  Resp: 20  Temp: 98.7 F (37.1 C)  SpO2: 97%   Filed Weights   01/16/18 1123  Weight: 161 lb 8 oz (73.3 kg)    GENERAL:alert, no distress and comfortable SKIN: skin color, texture, turgor are normal, no rashes or significant lesions EYES: normal, Conjunctiva are pink and non-injected, sclera clear OROPHARYNX:no exudate, no erythema and lips, buccal mucosa, and tongue normal  LYMPH:  no palpable cervical, supraclavicular or axillary  lymphadenopathy LUNGS: clear to auscultation bilaterally with normal breathing effort HEART: tachycardic, regular rhythm and no murmurs and no lower extremity edema ABDOMEN:abdomen soft, non-tender and normal bowel sounds. No hepatomegaly Musculoskeletal:no cyanosis of digits and no clubbing  NEURO:  alert & oriented x 3 with fluent speech, no focal motor/sensory deficits BREASTS: both breasts are surgically absent. Incisions are well healed. No palpable abnormalities along incisions or chest wall.  PAC without erythema   LABORATORY DATA:  I have reviewed the data as listed CBC Latest Ref Rng & Units 01/16/2018 01/05/2018 01/04/2018  WBC 3.9 - 10.3 K/uL 6.2 3.3(L) 4.0  Hemoglobin 11.6 - 15.9 g/dL 13.1 11.5(L) 11.3(L)  Hematocrit 34.8 - 46.6 % 40.2 36.9 35.8(L)  Platelets 145 - 400 K/uL 218 161 129(L)     CMP Latest Ref Rng & Units 01/16/2018 01/05/2018 01/04/2018  Glucose 70 - 140 mg/dL 138 107(H) 103(H)  BUN 7 - 26 mg/dL _0 Creatinine 0.60 - 1.10 mg/dL 0.91 0.89 0.84  Sodium 136 - 145 mmol/L 141 143 141  Potassium 3.5 - 5.1 mmol/L 3.9 3.9 3.6  Chloride 98 - 109 mmol/L 106 109 108  CO2 22 - 29 mmol/L _1 Calcium 8.4 - 10.4 mg/dL 9.9 9.0 8.6(L)  Total Protein 6.4 - 8.3 g/dL 7.0 - -  Total Bilirubin 0.2 - 1.2 mg/dL 0.5 - -  Alkaline Phos 40 - 150 U/L 63 - -  AST 5 - 34 U/L 11 - -  ALT 0 - 55 U/L 12 - -      RADIOGRAPHIC STUDIES: I have  personally reviewed the radiological images as listed and agreed with the findings in the report. No results found.   ASSESSMENT & PLAN: Sheila [name] is a 42 y.o. premenopausal female with a history of Vitamin D deficiency and high cholesterol and kidney stones, presented with a palpable right breast mass.   1. Malignant neoplasm of upper-outer quadrant of right breast in female, mixed invasive ductal and lobular carcinoma, pT3(m)N2aM0, stage 1b, ductal carcinoma triple positive, lobular carcinoma ER+/PR+/HER2-,  Grade 2 -Ms. Borden appears stable. She completed adjuvant chemo TC on 12/12/17. Herc/perjeta was held from cycles 5 and 6 due to change on echo. She tolerated chemo moderately well overall. She was hospitalized after cycle 6 for multifocal pneumonia, influenza A, sepsis, and hypoxia. She was discharged on 2/18 and is gradually recovering. She is mildly dehydrated today; will give IVF with herceptin/perjeta.  -Imaging during hospitalization found suspicious pulmonary and osseous lesions that are concerning for metastasis; will obtain PET in approx 2 weeks to further evaluate. Rad onc consult pending PET results. -Repeat echo by Dr. Aundra Dubin showed improved GLS, OK per cardiology to restart anti-HER2 therapy. She will proceed with herceptin/perjeta today and continue q3 weeks. Repeat echo in 2 months -Due to her ER/PR positive disease, she is a candidate for endocrine therapy with AI and ovarian suppression (zoladex); Dr. Burr Medico discussed with her today. If she does not have metastatic disease, will likely give zoladex at next visit and begin AI approx 1 month later. If she does have metastatic disease, will still likely give zoladex with other systemic therapy. -Return for f/u and next cycle herceptin/perjeta in 3 weeks   2. Hot flashes  -She developed mild hot flashes during chemotherapy TCHP but have become more frequent over last 2-3 weeks, sometimes 20 per day/night. This is negatively  affecting quality of life. We discussed SSRI vs neurontin for her symptoms. Because she gets benefit from Remeron, I will keep her on this; cannot use effexor. Will start with low dose gabapentin 100 mg qHS.   3. Genetics  -negative for tested mutations  4. Hyperglycemia without history of DM, secondary to steroids and  chemotherapy -resolved  5. 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 She will loose her insurance at the end of March. I asked our financial advocate Marguarite Arbour to meet with her today to discuss her options.   6. Febrile neutropenia -She was hospitalized for sepsis after cycle 1 TCHP, Onpro was added from cycle 2. She was hospitalized sebsequently after cycle 6 (TC only) for multifocal pneumonia, influenza A, sepsis, and hypoxia. She was discharged on 2/18 and is recovering slowly, she still remains quite fatigued.   7. Hypokalemia  -Secondary to diarrhea and dehydration. Resolved now. She is off oral potassium. I reviewed diarrhea is common with herceptin and perjeta, if she develops may have to restart oral K in the future.   8. Insomia -she has long history of insomnia, without much success with lunesta, trazodone, tylenol PM, and xanax. She is on Remeron, which helps slightly. Ativan has alleviated her symptoms, asking for refill today. I refilled.  PLAN:  -Pt to meet with Stefanie Libel, financial advocate today after chemo -Herceptin, perjeta, and IVF today, cont maintenance herc/perjeta q3 weeks -Prescription for neurontin -Refilled ativan  -PET in next couple weeks, ordered today  -rad onc consult pending -Echo in 2 months  -likely Zoladex in 3 weeks, AI pending     Orders Placed This Encounter  Procedures  . NM PET Image Initial (PI) Skull Base To Thigh    Standing Status:   Future    Standing Expiration Date:   01/16/2019    Order Specific Question:   If indicated for the ordered procedure, I authorize the  administration of a radiopharmaceutical per Radiology protocol    Answer:   Yes    Order Specific Question:   Is the patient pregnant?    Answer:   No    Order Specific Question:   Preferred imaging location?    Answer:   [facility]    Order Specific Question:   Radiology Contrast Protocol - do NOT remove file path    Answer:   _[email or web address]    Order Specific Question:   Reason for Exam additional comments    Answer:   breast cancer s/p bil mastectomy and adjuvant chemo, rule out pulmonary and osseous metastasis   All questions were answered. The patient knows to call the clinic with any problems, questions or concerns. No barriers to learning was detected. I spent 25 minutes counseling the patient face to face. The total time spent in the appointment was 35 minutes and more than 50% was on counseling and review of test results     [name], NP 01/16/18   Addendum  I have seen the patient, examined her. I agree with the assessment and and plan and have edited the notes.   Margart is recovering from recent hospitalization slowly, still quite fatigued, not very active. Will get a PET scan in the next few weeks, to follow her lung mass found on the CT scan when she was recently admitted. May need biopsy if PET positive. Will continue Herceptin and Perjeta for now. Given her young age, strong ER+/PR+ disease, I would recommend ovarian suppression and AI. Plan to start zoladex injection on next visit.   [name]  01/16/2018

## 2018-01-16 NOTE — Progress Notes (Signed)
Per Lacie NP, ok to tx today with HR 119.

## 2018-01-16 NOTE — Progress Notes (Signed)
Met with patient whom had concerns regarding insurance ending at the end of this month due to losing her job. ° °Advised patient once she becomes uninsured, she will automatically receive a 55% discount for services billed through [facility] and a [place] can be completed and submitted to determine if she is eligible for any more than 55%. Also advised her as far as her treatment drugs, Rob in pharmacy will reach out to her regarding drug replacement for any that qualify. Gave patient both Medicaid application and FA to complete once her coverage has termed. ° °Patient already has grant funds through [employer] to assist with transportation cost, medication, and other personal expenses while going through treatment. Provided her with her grant balance and asked if she had any needs she wanted to take advantage of today. Patient declined. She has my card for any additional financial questions or concerns.    °

## 2018-01-22 ENCOUNTER — Other Ambulatory Visit: Payer: Self-pay | Admitting: Hematology

## 2018-01-22 ENCOUNTER — Institutional Professional Consult (permissible substitution): Payer: PRIVATE HEALTH INSURANCE | Admitting: Radiation Oncology

## 2018-01-26 ENCOUNTER — Telehealth: Payer: Self-pay | Admitting: Emergency Medicine

## 2018-01-26 NOTE — Telephone Encounter (Signed)
Pts pet scan scheduled for 3/20. Pt is to be NPO 6hrs prior. PT made aware.

## 2018-01-30 NOTE — Progress Notes (Signed)
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    Receptor Status: ER(100 % +), PR (100 % +), Her2-neu (-  2.55 ratio), Ki-(12 %)  Did patient present with symptoms (if so, please note symptoms) or was this found on screening mammography?: She noted a palpable lump in the right breast as well as nipple and skin retraction. She underwent diagnostic mammogram  Past/Anticipated interventions by surgeon, if any:  2. FLUORESCENCE IN-SITU HYBRIDIZATION       HER2 - NEGATIVE  2. PROGNOSTIC INDICATORS Results: IMMUNOHISTOCHEMICAL AND MORPHOMETRIC ANALYSIS PERFORMED MANUALLY Estrogen Receptor: 95%, POSITIVE, STRONG STAINING INTENSITY Progesterone Receptor: 95%, POSITIVE, STRONG STAINING INTENSITY Proliferation Marker Ki67: 10% 1 of 4. FLUORESCENCE IN-SITU HYBRIDIZATION Results: HER2 - **POSITIVE  4. PROGNOSTIC INDICATORS Results: IMMUNOHISTOCHEMICAL AND MORPHOMETRIC ANALYSIS PERFORMED MANUALLY Estrogen Receptor: 95%, POSITIVE, STRONG STAINING INTENSITY Progesterone Receptor: 95%, POSITIVE, STRONG STAINING INTENSITY  Diagnosis 07-22-17 Dr. Matthew Wakefield 1. Breast, simple mastectomy, Left - FIBROADENOMA. - NO MALIGNANCY IDENTIFIED. 2. 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 - ADDITIONAL FOCUS OF LOBULAR CARCINOMA, GRADE 2, SPANNING 2.2 CM. - INTERMEDIATE GRADE DUCTAL CARCINOMA IN SITU. - INVASIVE CARCINOMA COMES TO WITHIN 0.2 TO 0.3 CM OF THE DEEP MARGIN, FOCALLY. - TUMOR FOCALLY INVOLVES EPIDERMIS. - LYMPHOVASCULAR INVASION OF DERMAL LYMPHATICS. - ONE OF ONE LYMPH NODES NEGATIVE FOR CARCINOMA (0/1). - SEE ONCOLOGY TABLE. 3. Lymph node, sentinel, biopsy, Right axillary - ONE LYMPH NODE WITH ISOLATED TUMOR CELLS (0/1). 4. 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 5. Lymph node, sentinel, biopsy, Right - ONE OF ONE LYMPH NODES NEGATIVE FOR CARCINOMA (0/1). 6. Lymph node, sentinel, biopsy, Right - METASTATIC CARCINOMA IN ONE OF ONE LYMPH NODES (1/1). - EXTRACAPSULAR EXTENSION. 7. Lymph node, sentinel, biopsy, Right - METASTATIC CARCINOMA IN ONE OF ONE LYMPH NODES (1/1). - EXTRACAPSULAR EXTENSION. 8. Lymph node, sentinel, biopsy, Right - ONE OF ONE LYMPH NODES NEGATIVE FOR CARCINOMA (0/1). 9. Lymph node, sentinel, biopsy, Right - ONE OF ONE LYMPH NODES NEGATIVE FOR CARCINOMA (0/1). 10. Lymph node, sentinel, biopsy, Right - METASTATIC CARCINOMA IN ONE OF ONE LYMPH NODES (1/1). - EXTRACAPSULAR EXTENSION. 11. Lymph node, biopsy, Right axillary - ONE OF ONE LYMPH NODES NEGATIVE FOR CARCINOMA (0/1).  Receptor Status: ER(100 % +), PR (100 % +), Her2-neu (-  2.55 ratio), Ki-(12 %)   Past/Anticipated interventions by medical oncology, if any: Chemotherapy   08/29/2017 adjuvant TCHP every 3 weeks for 6 cycles, followed by maintenance Herceptin with or without Perjeta for 6-12 months. Held after cycle 1 due to Sepsis and hospitalization.  06-25-17 Genetic testing negative result   Lymphedema issues, if any: No Rom to bilateral  arms good Skin to bilateral breast healing without signs of infection normal pink skin.        Will see Dr. Matthew Wakefield in June or July of 2019 saw last in September 2018.  Pain issues, if any: No  SAFETY ISSUES:  Prior radiation? : No  Pacemaker/ICD? : No  Possible current pregnancy? :No  Is the patient on methotrexate? :No   Menarche  19  G0 P0  Does not want kids                                    LMP: July 23rd, 2018 Contraceptive: 20-27 on birth control pill HRT: NA  Current Complaints / other details:     Wt Readings from Last 3 Encounters:  02/10/18 158 lb (71.7 kg)  02/06/18 161 lb 6.4 oz (73.2 kg)  01/16/18 161 lb 8 oz  (73.3 kg)   BP 101/77 (BP Location: Left Arm, Patient Position: Sitting, Cuff Size: Normal)   Pulse (!) 122   Temp 98.5 F (36.9 C) (Oral)   Resp 18   Ht 5' 6" (1.676 m)   Wt 158 lb (71.7 kg)   LMP 06/09/2017   SpO2 98%   BMI 25.50 kg/m   BP 101/77 (BP Location: Left Arm, Patient Position: Sitting, Cuff Size: Normal)   Pulse (!) 121   Temp 98.5 F (36.9 C) (Oral)   Resp 18   Ht 5' 6" (1.676 m)   Wt 158 lb (71.7 kg)   LMP 06/09/2017   SpO2 98%   BMI 25.50 kg/m   Pulse has been elevated since taking chemotherapy completed chemotherapy 12-11-17 still receiving infusions to get 5 more months of infusions. [name], Tonie Blackwell, RN 01/30/2018,4:48 PM   

## 2018-02-04 ENCOUNTER — Ambulatory Visit (HOSPITAL_COMMUNITY)
Admission: RE | Admit: 2018-02-04 | Discharge: 2018-02-04 | Disposition: A | Discharge status: Home / Self Care | Payer: PRIVATE HEALTH INSURANCE | Source: Ambulatory Visit | Attending: Nurse Practitioner | Admitting: Nurse Practitioner

## 2018-02-04 ENCOUNTER — Encounter (HOSPITAL_COMMUNITY): Payer: Self-pay

## 2018-02-04 DIAGNOSIS — Z170 Estrogen receptor positive status [ER+]: Secondary | ICD-10-CM | POA: Insufficient documentation

## 2018-02-04 DIAGNOSIS — J9811 Atelectasis: Secondary | ICD-10-CM | POA: Diagnosis not present

## 2018-02-04 DIAGNOSIS — C50411 Malignant neoplasm of upper-outer quadrant of right female breast: Secondary | ICD-10-CM | POA: Insufficient documentation

## 2018-02-04 DIAGNOSIS — D493 Neoplasm of unspecified behavior of breast: Secondary | ICD-10-CM | POA: Diagnosis present

## 2018-02-04 DIAGNOSIS — I898 Other specified noninfective disorders of lymphatic vessels and lymph nodes: Secondary | ICD-10-CM | POA: Insufficient documentation

## 2018-02-04 LAB — GLUCOSE, CAPILLARY: GLUCOSE-CAPILLARY: 102 mg/dL — AB (ref 65–99)

## 2018-02-04 MED ORDER — FLUDEOXYGLUCOSE F - 18 (FDG) INJECTION
8.2400 | Freq: Once | INTRAVENOUS | Status: AC
  Administered 2018-02-04: 8.24 via INTRAVENOUS

## 2018-02-04 NOTE — Progress Notes (Signed)
Sheila [name]  [phone] Fax:([phone]) [phone]  Clinic Follow Up Note   Patient Care Team: [name], MD as PCP - General (Family Medicine) [name] (Family Medicine) [name], MD as Consulting Physician (General Surgery) [name], MD as Consulting Physician (Hematology) Kyung Rudd, MD as Consulting Physician (Radiation Oncology)   Date of Service:  02/06/2018  CHIEF COMPLAINTS:  Follow up right breast cancer    Oncology History    Cancer Staging Malignant neoplasm of upper-outer quadrant of right breast in female, estrogen receptor positive ([place]) Staging form: Breast, AJCC 8th Edition - Clinical stage from 06/11/2017: Stage IB (cT3, cN0, cM0, G2, ER: Positive, PR: Positive, HER2: Positive) - Signed by [name], MD on 06/16/2017 - Pathologic stage from 07/22/2017: Stage IB (pT3(m), pN2a(sn), cM0, G2, ER: Positive, PR: Positive, HER2: Positive) - Signed by [name], NP on 08/04/2017      Malignant neoplasm of upper-outer quadrant of right breast in female, estrogen receptor positive ([place])   06/11/2017 Initial Biopsy    Diagnosis 06/11/17 Breast, right, needle core biopsy, 11:00 o'clock - INVASIVE DUCTAL CARCINOMA, G2      06/11/2017 Mammogram    [name] and MM Diagnostic Breast Tomo Bilateral 06/11/17 IMPRESSION: 1. Highly suspicious mass within the retroareolar right breast, extending from the 8:00 to 11:00 axes, anterior to posterior depth, extending anteriorly to the nipple with associated nipple retraction, measuring at least 5.5 cm by ultrasound  2. No sonographic evidence of metastatic lymphadenopathy in the right axilla. 3. No evidence of malignancy within the left breast.       06/11/2017 Initial Diagnosis    Malignant neoplasm of upper-outer quadrant of right breast in female, estrogen receptor positive ([place])      06/11/2017 Receptors her2    Estrogen Receptor: 100%, POSITIVE, STRONG STAINING  INTENSITY Progesterone Receptor: 100%, POSITIVE, STRONG STAINING INTENSITY Proliferation Marker Ki67: 12%Proliferation Marker Ki67: 12%  HER2 - **POSITIVE** RATIO OF HER2/CEP17 SIGNALS 2.55 AVERAGE HER2 COPY NUMBER PER CELL 4.85      06/25/2017 Genetic Testing    Negative genetic testing on the 9 gene STAT panel.  The STAT Breast cancer panel offered by [facility] includes sequencing and rearrangement analysis for the following 9 genes:  ATM, BRCA1, BRCA2, CDH1, CHEK2, PALB2, PTEN, STK11 and TP53.   The report date is June 25, 2017.  Negative genetic testing on the common hereditary cancer panel.  The Hereditary Gene Panel offered by [facility] includes sequencing and/or deletion duplication testing of the following 46 genes: APC, ATM, AXIN2, BARD1, BMPR1A, BRCA1, BRCA2, BRIP1, CDH1, CDKN2A (p14ARF), CDKN2A (p16INK4a), CHEK2, CTNNA1, DICER1, EPCAM (Deletion/duplication testing only), GREM1 (promoter region deletion/duplication testing only), KIT, MEN1, MLH1, MSH2, MSH3, MSH6, MUTYH, NBN, NF1, NHTL1, PALB2, PDGFRA, PMS2, POLD1, POLE, PTEN, RAD50, RAD51C, RAD51D, SDHB, SDHC, SDHD, SMAD4, SMARCA4. STK11, TP53, TSC1, TSC2, and VHL.  The following genes were evaluated for sequence changes only: SDHA and HOXB13 c.251G>A variant only.  The report date is June 25, 2017.       07/02/2017 Imaging    CT cAP 07/02/17 IMPRESSION: 1. Subareolar right breast mass. No compelling findings of nodal or metastatic involvement. 2. Old granulomatous disease. 3. There is a 7 mm enhancing or hyperdense focus posteriorly in segment 7 of the liver in the subcapsular region. By virtue of its small size this lesion is technically nonspecific although statistically likely to be a small benign lesions such as flash filling hemangioma. This may warrant surveillance. 4.  Prominent stool throughout  the colon favors constipation. 5. Degenerative disc disease and spondylosis at L5-S1 likely causing mild impingement.       07/02/2017 Imaging    Bone scan 07/02/17 IMPRESSION: Today' s exam is negative. I ascribed the tiny focus of activity just proximal to the left antecubital region to injection site.      07/22/2017 Surgery    RIGHT TOTAL MASTECTOMY WITH RIGHT AXILLARY SENTINEL LYMPH NODE BIOPSY, LEFT PROPHYLACTIC MASTECTOM and INSERTION PORT-A-CATH WITH [name] by [name] and [facility]       07/22/2017 Pathology Results    Diagnosis 1. Breast, simple mastectomy, Left - FIBROADENOMA. - NO MALIGNANCY IDENTIFIED. 2. 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 - ADDITIONAL FOCUS OF LOBULAR CARCINOMA, GRADE 2, SPANNING 2.2 CM. - INTERMEDIATE GRADE DUCTAL CARCINOMA IN SITU. - INVASIVE CARCINOMA COMES TO WITHIN 0.2 TO 0.3 CM OF THE DEEP MARGIN, FOCALLY. - TUMOR FOCALLY INVOLVES EPIDERMIS. - LYMPHOVASCULAR INVASION OF DERMAL LYMPHATICS. - ONE OF ONE LYMPH NODES NEGATIVE FOR CARCINOMA (0/1). - SEE ONCOLOGY TABLE. 3. Lymph node, sentinel, biopsy, Right axillary - ONE LYMPH NODE WITH ISOLATED TUMOR CELLS (0/1). 4. Lymph node, sentinel, biopsy, Right - METASTATIC CARCINOMA IN ONE OF ONE LYMPH NODES (1/1). 5. Lymph node, sentinel, biopsy, Right - ONE OF ONE LYMPH NODES NEGATIVE FOR CARCINOMA (0/1). 6. Lymph node, sentinel, biopsy, Right - METASTATIC CARCINOMA IN ONE OF ONE LYMPH NODES (1/1). - EXTRACAPSULAR EXTENSION. 7. Lymph node, sentinel, biopsy, Right - METASTATIC CARCINOMA IN ONE OF ONE LYMPH NODES (1/1). - EXTRACAPSULAR EXTENSION. 8. Lymph node, sentinel, biopsy, Right - ONE OF ONE LYMPH NODES NEGATIVE FOR CARCINOMA (0/1). 9. Lymph node, sentinel, biopsy, Right - ONE OF ONE LYMPH NODES NEGATIVE FOR CARCINOMA (0/1). 10. Lymph node, sentinel, biopsy, Right - METASTATIC CARCINOMA IN ONE OF ONE LYMPH NODES (1/1). - EXTRACAPSULAR EXTENSION. 11. Lymph node, biopsy, Right axillary - ONE OF ONE LYMPH NODES NEGATIVE FOR CARCINOMA (0/1).        07/22/2017 Receptors her2    Ductal carcinoma in primary breast tumor and node metastasis are ER 95%, PR 95% strongly positive, and HER2 + The lobular component in primary breast tumor is ER 95% positive, PR 95% positive, strong staining, HER-2 negative.       08/25/2017 Imaging    MRI abdomen done at [facility]:  Enhancing structure measing 5 mm is compatible with a flash filling hemangioma. No specific findings to suggest metastatic cancer      08/25/2017 Imaging    MRI Abdomen at [facility] 08/25/17  IMPRESSION: 1. Lesion in segment 7 of the liver has signal and enhancement characteristics compatible with a flash filling hemangioma. No specific findings identified to suggest metastatic disease.      08/29/2017 - 12/12/2017 Chemotherapy    adjuvant TCHP every 3 weeks for 6 cycles starting 08/29/17, followed by maintenance Herceptin with or without Perjeta for 6-12 months. Postponed and decreased Carbo to 675m and added Onpro with cycle 2 on 09/19/17 due to neutropenic fever. Carbplatin further reduced to 5075mstarting with cycle 3 due to thrombocytopenia. Herceptin was held for Cycle 4 and 5 due to worsening global longitudinal strain found on ECHO from 11/07/17.          09/04/2017 - 09/09/2017 Hospital Admission    Admit date: 09/04/2017 Discharge date: 09/09/2017  DISCHARGE DIAGNOSES:  Principal Problem:   [facility]:   Malignant neoplasm of upper-outer quadrant of right breast in female, estrogen receptor positive (  [place])   Port-A-Cath in place   Neutropenic fever ([place])   Hyponatremia   Malignant neoplasm of breast in female, estrogen receptor positive ([place])       12/28/2017 Imaging    CT CHEST IMPRESSION: 1. 2.3 x 2.7 x 1.2 cm irregular masslike opacity within the right middle lobe worrisome for malignancy/metastasis. 2. 3 new very small sclerotic lesions, within the sternum and the body of T4, worrisome for new metastases.      12/28/2017 - 01/05/2018  Hospital Admission    Admit date: 12/28/17-01/05/18 Admission diagnosis: multifocal pneumonia, influenza A, sepsis, hypoxia Additional comments: imaging during hospitalization notable for new irregular masslike opacity in the right middle lobe worrisome for metastasis and new very small sclerotic lesion within the sternum and the body of T4, worrisome for metastasis      12/29/2017 Imaging    CT ABD/PELVIS IMPRESSION: No definite acute intra-abdominal or intrapelvic abnormalities.  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      12/30/2017 Imaging    CT HEAD IMPRESSION: No acute intracranial abnormality or enhancing lesion.      02/04/2018 PET scan    IMPRESSION: 1. The previous consolidation in the right middle lobe currently has a more nodular and reticular appearance with some associated mild atelectasis along its anterior margin. This demonstrates low-grade metabolic activity with maximum SUV 3.2 (background blood pool activity is 2.7). Although conceivably related to low-grade residuum from the patient's malignancy, the presence of calcified right hilar lymph nodes and small calcifications along the nodularity raise suspicion for a low-grade active granulomatous process or atypical infectious process. Surveillance is recommended. 2. Scattered deposits of hypermetabolic but benign metabolically active brown fat.      HISTORY OF PRESENTING ILLNESS: 03/18/01 Suan Halter 42 y.o. female is here because of newly diagnosed Malignant neoplasm of upper-outer quadrant of right breast. She presents to the breast clinic today with her common law husband. She felt the lump initially when she had her physical 2 weeks ago, she had felt her nipples retracted several years ago. A few months ago it has become more pronounced. She had no pain or discharge.   In the past was diagnosed with Vitamin D deficiency and high cholesterol. She previously was on mood  stabilizing medication. She feel right now she does not need that medication but is overwhelmed. She donated eggs and previously had kidney stones removed. Her mother had breast cancer and was diagnosed at 62 along with paternal grandmother in her 44's.   Today she has heart burn which she is taking Prilosec and will see her GI later today. She has trouble sleeping and uses tylenol pm. She tried [place] which did not help, same with melatonin. Her common law husband Is on disability.  GYN HISTORY  Menarchal: 19 LMP: July 23rd, 2018 Contraceptive: 20-27 on birth control pill HRT: NA G0P0: does not want kids  CURRENT THERAPY:  Herceptin and Perjeta maintenance therapy every 3 weeks. Pending Radiation therapy   INTERVAL HISTORY:  Azlee Monforte is here for a follow up. She presents to the clinic today accompanied by her husband. She reports she is feeling well. Her energy is at 50-60%, which is better. She notes to is going to meet with Rad Onc next week.  On review of systems, pt denies cough, CP, SOB, leg swelling or any other complaints at this time. Pertinent positives are listed and detailed within the above HPI.   MEDICAL HISTORY:  Past Medical History:  Diagnosis Date  . Anxiety   . Cancer [facility]    right breast   . Family history of breast cancer   . Family history of colon cancer   . GERD (gastroesophageal reflux disease)   . Headache    couple of migraines in the past  . History of kidney stones   . Vitamin D deficiency    SURGICAL HISTORY: Past Surgical History:  Procedure Laterality Date  . egg donation    . kidney stone removal    . MASTECTOMY W/ SENTINEL NODE BIOPSY Bilateral 07/22/2017   Procedure: RIGHT TOTAL MASTECTOMY WITH RIGHT AXILLARY SENTINEL LYMPH NODE BIOPSY, LEFT PROPHYLACTIC MASTECTOMY;  Surgeon: [name], MD;  Location: [place];  Service: General;  Laterality: Bilateral;  . PORTACATH PLACEMENT Right 07/22/2017   Procedure: INSERTION PORT-A-CATH  WITH [name];  Surgeon: [name], MD;  Location: [place];  Service: General;  Laterality: Right;   SOCIAL HISTORY: Social History   Socioeconomic History  . Marital status: Single    Spouse name: Not on file  . Number of children: Not on file  . Years of education: Not on file  . Highest education level: Not on file  Occupational History  . Not on file  Social Needs  . Financial resource strain: Not on file  . Food insecurity:    Worry: Not on file    Inability: Not on file  . Transportation needs:    Medical: Not on file    Non-medical: Not on file  Tobacco Use  . Smoking status: Never Smoker  . Smokeless tobacco: Former Network engineer and Sexual Activity  . Alcohol use: Yes    Comment: once a year  . Drug use: No  . Sexual activity: Yes  Lifestyle  . Physical activity:    Days per week: Not on file    Minutes per session: Not on file  . Stress: Not on file  Relationships  . Social connections:    Talks on phone: Not on file    Gets together: Not on file    Attends religious service: Not on file    Active member of club or organization: Not on file    Attends meetings of clubs or organizations: Not on file    Relationship status: Not on file  . Intimate partner violence:    Fear of current or ex partner: Not on file    Emotionally abused: Not on file    Physically abused: Not on file    Forced sexual activity: Not on file  Other Topics Concern  . Not on file  Social History Narrative  . Not on file   FAMILY HISTORY: Family History  Problem Relation Age of Onset  . Breast cancer Paternal Grandmother 84  . Colon cancer Paternal Grandmother 64  . Breast cancer Mother 4  . Prostate cancer Paternal Grandfather        dx in his 71s  . Leukemia Paternal Grandfather   . Lung cancer Maternal Uncle        heavy smoker  . Other Paternal Uncle        farm accident  . Stroke Maternal Grandfather    ALLERGIES:  has No Known Allergies.  MEDICATIONS:    Current Outpatient Medications  Medication Sig Dispense Refill  . Cholecalciferol (VITAMIN D) 2000 units tablet Take 2,000 Units by mouth daily.    . diphenoxylate-atropine (LOMOTIL) 2.5-0.025 MG tablet Take 1-2 tablets by mouth 4 (four)  times daily as needed for diarrhea or loose stools. 30 tablet 2  . fluticasone (FLONASE) 50 MCG/ACT nasal spray Place 1 spray into both nostrils daily.    Marland Kitchen gabapentin (NEURONTIN) 100 MG capsule Take 1 capsule (100 mg total) by mouth at bedtime. 30 capsule 0  . lidocaine-prilocaine (EMLA) cream Apply to affected area once (Patient taking differently: Apply 1 application topically once as needed (port access). Apply to affected area once) 30 g 3  . LORazepam (ATIVAN) 0.5 MG tablet Take 1 tablet (0.5 mg total) by mouth every 8 (eight) hours as needed for anxiety or sleep (or nausea). 30 tablet 0  . mirtazapine (REMERON) 30 MG tablet Take 30 mg by mouth at bedtime.    . naproxen sodium (ANAPROX) 220 MG tablet Take 220 mg 2 (two) times daily as needed by mouth (pain).     . prochlorperazine (COMPAZINE) 10 MG tablet Take 1 tablet (10 mg total) by mouth every 6 (six) hours as needed (Nausea or vomiting). 30 tablet 1  . loperamide (IMODIUM) 2 MG capsule Take 1 capsule (2 mg total) by mouth 3 (three) times daily as needed for diarrhea or loose stools. (Patient not taking: Reported on 02/06/2018) 30 capsule 0  . venlafaxine XR (EFFEXOR-XR) 75 MG 24 hr capsule Take 1 capsule (75 mg total) by mouth daily with breakfast. 30 capsule 2   Current Facility-Administered Medications  Medication Dose Route Frequency Provider Last Rate Last Dose  . gi cocktail (Maalox,Lidocaine,Donnatal)  30 mL Oral Once Harle Stanford., PA-C       Facility-Administered Medications Ordered in Other Visits  Medication Dose Route Frequency Provider Last Rate Last Dose  . 0.9 %  sodium chloride infusion   Intravenous Once [name], MD      . acetaminophen (TYLENOL) tablet 650 mg  650 mg Oral Once  [name], MD      . diphenhydrAMINE (BENADRYL) capsule 50 mg  50 mg Oral Once [name], MD      . heparin lock flush 100 unit/mL  500 Units Intracatheter Once PRN [name], MD      . pertuzumab (PERJETA) 420 mg in sodium chloride 0.9 % 250 mL chemo infusion  420 mg Intravenous Once [name], MD      . sodium chloride flush (NS) 0.9 % injection 10 mL  10 mL Intracatheter PRN [name], MD      . trastuzumab (HERCEPTIN) 450 mg in sodium chloride 0.9 % 250 mL chemo infusion  450 mg Intravenous Once [name], MD       REVIEW OF SYSTEMS:  Constitutional: Denies fevers, (+) moderate-severe hot flash Eyes: Denies blurriness of vision, double vision or watery eyes Ears, nose, mouth, throat, and face: Denies mucositis or sore throat (+) occasional epistaxis Respiratory: Denies cough, dyspnea or wheezes Cardiovascular: Denies palpitation, chest discomfort or lower extremity swelling Gastrointestinal:  Denies nausea, heartburn or change in bowel habits   Skin: Denies abnormal skin rashes Lymphatics: Denies new lymphadenopathy or easy bruising Neurological:Denies numbness, tingling or new weaknesses Behavioral/Psych: Mood is stable, no new changes  Breast: (+) bilateral mastectomy on 07/22/17 All other systems were reviewed with the patient and are negative.  PHYSICAL EXAMINATION:  ECOG PERFORMANCE STATUS: 1 - Symptomatic but completely ambulatory  Vitals:   02/06/18 1019  BP: 96/67  Pulse: (!) 123  Resp: 20  Temp: 97.9 F (36.6 C)  SpO2: 99%   Filed Weights   02/06/18 1019  Weight: 161 lb 6.4 oz (73.2  kg)      GENERAL:alert, no distress and comfortable SKIN: skin color, texture, turgor are normal, no rashes or significant lesions EYES: normal, conjunctiva are pink and non-injected, sclera clear OROPHARYNX:no exudate, no erythema and lips, buccal mucosa, and tongue normal  NECK: supple, thyroid normal size, non-tender, without nodularity LYMPH:  no palpable lymphadenopathy in the  cervical, axillary or inguinal LUNGS: clear to auscultation and percussion with normal breathing effort HEART: regular rate & rhythm and no murmurs and no lower extremity edema ABDOMEN:abdomen soft, non-tender and normal bowel sounds Musculoskeletal:no cyanosis of digits and no clubbing  PSYCH: alert & oriented x 3 with fluent speech NEURO: no focal motor/sensory deficits Breast: (+)bilateral mastectomy, incisions clean and intact, healing well. Bilateral drains present.   LABORATORY DATA:  I have reviewed the data as listed CBC Latest Ref Rng & Units 02/06/2018 01/16/2018 01/05/2018  WBC 3.9 - 10.3 K/uL 3.9 6.2 3.3(L)  Hemoglobin 11.6 - 15.9 g/dL 12.7 13.1 11.5(L)  Hematocrit 34.8 - 46.6 % 38.6 40.2 36.9  Platelets 145 - 400 K/uL 181 218 161   CMP Latest Ref Rng & Units 02/06/2018 01/16/2018 01/05/2018  Glucose 70 - 140 mg/dL 104 138 107(H)  BUN 7 - 26 mg/dL _0 Creatinine 0.60 - 1.10 mg/dL 0.89 0.91 0.89  Sodium 136 - 145 mmol/L 141 141 143  Potassium 3.5 - 5.1 mmol/L 4.0 3.9 3.9  Chloride 98 - 109 mmol/L 107 106 109  CO2 22 - 29 mmol/L _1 Calcium 8.4 - 10.4 mg/dL 10.0 9.9 9.0  Total Protein 6.4 - 8.3 g/dL 7.1 7.0 -  Total Bilirubin 0.2 - 1.2 mg/dL 0.5 0.5 -  Alkaline Phos 40 - 150 U/L 73 63 -  AST 5 - 34 U/L 19 11 -  ALT 0 - 55 U/L 22 12 -   PATHOLOGY  Diagnosis 07/22/17 1. Breast, simple mastectomy, Left - FIBROADENOMA. - NO MALIGNANCY IDENTIFIED. 2. 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 - ADDITIONAL FOCUS OF LOBULAR CARCINOMA, GRADE 2, SPANNING 2.2 CM. - INTERMEDIATE GRADE DUCTAL CARCINOMA IN SITU. - INVASIVE CARCINOMA COMES TO WITHIN 0.2 TO 0.3 CM OF THE DEEP MARGIN, FOCALLY. - TUMOR FOCALLY INVOLVES EPIDERMIS. - LYMPHOVASCULAR INVASION OF DERMAL LYMPHATICS. - ONE OF ONE LYMPH NODES NEGATIVE FOR CARCINOMA (0/1). - SEE ONCOLOGY TABLE. 3. Lymph node, sentinel, biopsy, Right axillary - ONE LYMPH  NODE WITH ISOLATED TUMOR CELLS (0/1). 4. Lymph node, sentinel, biopsy, Right - METASTATIC CARCINOMA IN ONE OF ONE LYMPH NODES (1/1). 5. Lymph node, sentinel, biopsy, Right - ONE OF ONE LYMPH NODES NEGATIVE FOR CARCINOMA (0/1). 6. Lymph node, sentinel, biopsy, Right - METASTATIC CARCINOMA IN ONE OF ONE LYMPH NODES (1/1). - EXTRACAPSULAR EXTENSION. 7. Lymph node, sentinel, biopsy, Right - METASTATIC CARCINOMA IN ONE OF ONE LYMPH NODES (1/1). - EXTRACAPSULAR EXTENSION. 8. Lymph node, sentinel, biopsy, Right - ONE OF ONE LYMPH NODES NEGATIVE FOR CARCINOMA (0/1). 9. Lymph node, sentinel, biopsy, Right - ONE OF ONE LYMPH NODES NEGATIVE FOR CARCINOMA (0/1). 10. Lymph node, sentinel, biopsy, Right - METASTATIC CARCINOMA IN ONE OF ONE LYMPH NODES (1/1). - EXTRACAPSULAR EXTENSION. 11. Lymph node, biopsy, Right axillary - ONE OF ONE LYMPH NODES NEGATIVE FOR CARCINOMA (0/1). Microscopic Comment 2. BREAST, INVASIVE TUMOR Microscopic Comment(continued) Procedure: Bilateral simple mastectomies with right axillary sentinel lymph node biopsies. Laterality: Right. Tumor Size: 5.3 cm. Histologic Type: Mixed lobular and ductal carcinoma. Grade: 2 Tubular Differentiation: 2 Nuclear  Pleomorphism: 3 Mitotic Count: 1 Ductal Carcinoma in Situ (DCIS): Present, intermediate grade. Extent of Tumor: Involves epidermis of nipple focally. Margins: Invasive carcinoma, distance from closest margin: 0.2-0.3 cm of posterior margin (smaller mass). DCIS, distance from closest margin: >0.5 cm all margins Regional Lymph Nodes: Number of Lymph Nodes Examined: 10 Number of Sentinel Lymph Nodes Examined: 9 Lymph Nodes with Macrometastases: 4 Lymph Nodes with Micrometastases: 0 Lymph Nodes with Isolated Tumor Cells: 1 Breast Prognostic Profile: Performed on biopsy ([id number]), see below. Will be performed on additional lobular focus. Estrogen Receptor: Positive, 100% strong staining. Progesterone Receptor:  Positive, 100% strong staining. Her2: Positive (ratio 2.55). Ki-67: 12%. Best tumor block for sendout testing: 2B (larger focus), 2E (smaller focus). Pathologic Stage Classification (pTNM, AJCC 8th Edition): Primary Tumor (pT): mpT3 Regional Lymph Nodes (pN): pN2a Distant Metastases (pM): pMX 2. FLUORESCENCE IN-SITU HYBRIDIZATION Results: HER2 - NEGATIVE RATIO OF HER2/CEP17 SIGNALS 1.71 AVERAGE HER2 COPY NUMBER PER CELL 1.80 2. PROGNOSTIC INDICATORS Results: IMMUNOHISTOCHEMICAL AND MORPHOMETRIC ANALYSIS PERFORMED MANUALLY Estrogen Receptor: 95%, POSITIVE, STRONG STAINING INTENSITY Progesterone Receptor: 95%, POSITIVE, STRONG STAINING INTENSITY Proliferation Marker Ki67: 10%  Diagnosis 06/11/17 Breast, right, needle core biopsy, 11:00 o'clock - INVASIVE DUCTAL CARCINOMA, SEE COMMENT. Microscopic Comment The carcinoma appears grade 2. Prognostic markers will be ordered. Dr. Lyndon Code has reviewed the case. The case was called to [facility] on 06/12/2017. Results: HER2 - **POSITIVE** RATIO OF HER2/CEP17 SIGNALS 2.55 AVERAGE HER2 COPY NUMBER PER CELL 4.85  PROCEDURES  ECHO expected 08/12/17  RADIOGRAPHIC STUDIES: I have personally reviewed the radiological images as listed and agreed with the findings in the report. Nm Pet Image Initial (pi) Skull Base To Thigh  Result Date: 02/05/2018 CLINICAL DATA:  Subsequent treatment strategy for right breast cancer. EXAM: NUCLEAR MEDICINE PET SKULL BASE TO THIGH TECHNIQUE: 8.24 mCi F-18 FDG was injected intravenously. Full-ring PET imaging was performed from the skull base to thigh after the radiotracer. CT data was obtained and used for attenuation correction and anatomic localization. Fasting blood glucose: 102 mg/dl Mediastinal blood pool activity: SUV max 2.7 COMPARISON:  Multiple exams, including CT exams from 12/30/2017 and 12/29/2017 FINDINGS: NECK: Low-grade symmetric palatine tonsillar activity bilaterally, likely  physiologic. Symmetric glottic activity, likely physiologic. Suboccipital muscular activity, again thought to be physiologic. There is activity in the adipose tissues of the lower neck without CT correlate, compatible with hypermetabolic brown fat. No hypermetabolic or pathologic adenopathy identified. Incidental CT findings: Unremarkable CHEST: Right middle lobe nodularity and minimal residual bandlike airspace opacity at the site of the prior consolidation, associated with a punctate and a linear calcification as on image 38/8. Maximum SUV 3.2. Back on 07/02/2017 there is some minimal nodularity in this vicinity; on the CT scans from February 2019 there was surrounding consolidation which today has improved and shows mainly nodularity and some bandlike atelectasis. Calcified right hilar lymph nodes are present, with maximum SUV similar to blood pool. Incidental CT findings: Right Port-A-Cath tip: Lower SVC. Right axillary clips noted. Low-grade activity along right mastectomy site. Small areas of paraspinal and intercostal activity correspond to adipose density and favor hypermetabolic brown fat. ABDOMEN/PELVIS: Mild likely physiologic activity in the duodenal bulb. No appreciable significant abnormal hypermetabolic activity in the abdomen/pelvis. Incidental CT findings: none SKELETON: No significant abnormal hypermetabolic skeletal activity. Incidental CT findings: none IMPRESSION: 1. The previous consolidation in the right middle lobe currently has a more nodular and reticular appearance with some associated mild atelectasis along its anterior margin. This demonstrates low-grade metabolic  activity with maximum SUV 3.2 (background blood pool activity is 2.7). Although conceivably related to low-grade residuum from the patient's malignancy, the presence of calcified right hilar lymph nodes and small calcifications along the nodularity raise suspicion for a low-grade active granulomatous process or atypical  infectious process. Surveillance is recommended. 2. Scattered deposits of hypermetabolic but benign metabolically active brown fat. Electronically Signed   By: Van Clines M.D.   On: 02/05/2018 08:22     PET Scan 02/04/18  IMPRESSION: 1. The previous consolidation in the right middle lobe currently has a more nodular and reticular appearance with some associated mild atelectasis along its anterior margin. This demonstrates low-grade metabolic activity with maximum SUV 3.2 (background blood pool activity is 2.7). Although conceivably related to low-grade residuum from the patient's malignancy, the presence of calcified right hilar lymph nodes and small calcifications along the nodularity raise suspicion for a low-grade active granulomatous process or atypical infectious process. Surveillance is recommended. 2. Scattered deposits of hypermetabolic but benign metabolically active brown fat.  CT AP W Contrast 12/29/17 IMPRESSION: No definite acute intra-abdominal or intrapelvic abnormalities. 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  CT Chest W Contrast 12/28/17 IMPRESSION: 1. 2.3 x 2.7 x 1.2 cm irregular masslike opacity within the right middle lobe worrisome for malignancy/metastasis. 2. 3 new very small sclerotic lesions, within the sternum and the body of T4, worrisome for new metastases.  CT AP W Contrast 09/05/17 IMPRESSION: Stable 7 mm hyperdensity seen in right hepatic lobe most consistent with hemangioma based on recent MRI. 2.4 cm right ovarian involuting cyst is noted. No other abnormality seen in the abdomen or pelvis.  MRI Abdomen at [facility] 08/25/17  IMPRESSION: 1. Lesion in segment 7 of the liver has signal and enhancement characteristics compatible with a flash filling hemangioma. No specific findings identified to suggest metastatic disease.  ASSESSMENT & PLAN:  Brianca Fortenberry is a 42 y.o. premenopausal female with a  history of Vitamin D deficiency and high cholesterol and kidney stones, presented with a palpable right breast mass.   1. Malignant neoplasm of upper-outer quadrant of right breast in female, mixed invasive ductal and lobular carcinoma, pT3(m)N2aM0, stage 1b, ductal carcinoma triple positive, lobular carcinoma ER+/PR+/HER2-,  Grade 2 -She underwent staging CT and bone scan on 07/02/2017 prior to surgery, both were negative for metastasis. -There was a 52m enhancing or hyperdense focus posteriorly in the liver which is technically nonspecific, and her abdominal MRI showed this lesion is likely hemangioma. -I previously reviewed the surgical pathology with the patient and her husband in detail.  -Final diagnosis from the right simple mastectomy and sentinel lymph node biopsy on 07/22/2017 revealed mixed invasive lobular and ductal carcinoma, grade 2, spanning 5.3 cm. There is an additional focus of lobular carcinoma, grade 2, spanning 2.2cm and it is ER+/PR+/HER2-. 4 of 10 lymph nodes tested positive for macrometastases and they are HER2+ ductal carcinoma.  -I previously discussed the possibility of oral anti-HER2 therapy after she completes Herceptin maintenance therapy. -We reviewed the risk of cancer recurrence after complete surgical resection. Giving the large size of tumor, multiple positive lymph nodes, HER-2 positive disease, she has very high risk for recurrence. I strongly recommend adjuvant chemotherapy to reduce her risk of recurrence. She started adjuvant docetaxel, carboplatin Herceptin and perjeta (TCHP). Planed for this regimen every 3 weeks for 6 cycles, followed by maintenance Herceptin with Perjeta for 12 months.   -She initially preferred reconstruction. She saw Dr. TIran Planas  but ultimately did not opt for reconstruction.  -Given her ER/PR positive tumor, I recommend adjuvant antiestrogen therapy with tamoxifen, starting after her adjuvant radiation. Benefits and the side effects were  previously reviewed with her, she is interested. Given her young age, ovarian suppression and an AI would be likely improve her cancer survival, we have discussed and she will think about it. Plan to discuss more extensively at a later visit. -Baseline echo was normal, she saw Dr. Aundra Dubin on 08/12/2017.She will continue with ECHO every 3-4 months until she is done with Herceptin.  -She started [facility] on 08/29/17, developed neutropenic fever and diarrhea afterwards and was hospitalized. Due to her neutropenic fever after first cycle I decreaseed carboplatin dose starting with cycle 2 on 09/19/17 and added Neulasta. Carboplatin was further reduced to AUC 4.5 due to thrombocytopenia starting with cycle 3. Herceptin was held for Cycle 4 and 5 due to worsening global longitudinal strain found on ECHO from 11/07/17. She completed 6 cycles on 12/12/17 -ECHO from 12/19/17 revealed EF 60% and no significant abnormalities. Next ECHO in April -Pt was admitted to the hospital on 12/28/17 with complaints of flulike symptoms and worked up for sepsis due to influenza. CT Chest from 2/12 showed multifocal/patchy opacities in RUL, RML, and bilateral lower lobes, that was suspicious for multifocal pneumonia. I saw the pt and discussed the other findings of the CT with her and her husband. It also revealed a new 2.7 cm irregular mass in the right middle lobe lung, and small tiny bone lesions in the sternum and T4 that are suspicious for metastatic disease. she was treated for influenza and superimposed pneumonia.  She has had a slow recovery from the hospitalization. -PET Scan from 02/04/18 revealed a small amount of uptake in the right lung that is about 80% resolved from her previous CT. I discussed her results and reviewed the scan and previous scans with pt and her husband in person. I do not suspect that this is metastasis and rather inflammation from pneumonia. Will continue to monitor and I will get a restaging scan in 2-3 months.    -Given her young age and locally advanced disease, I recommend ovarian suppression and AI. She is agreeable and will start Zoladex injection in the next month when her hot flashes become more manageable with Effexor. We also discussed the option of BSO, she is willing to consider in the future.  it is okay for her to proceed with radiation, she will see them next week. Plan for her to start her aromatase inhibitor towards the end of radiation  -HER2 FISH was initially positive, with ratio of 2.01, and a copy number of 3.63 on surgical sample. Based on the new HER-2 testing criteria, it would require a IHC test to confirm, I have requested yesterday. If she has HER2 IHC 3+, I would recommend adjuvant T-DM1 based on the recent Kr[place] trial data.  -F/u in 3 weeks   2. Genetics -Due to her family history of breast cancer, she agreed to a genetics referral -She was negative for all mutations tested    3. Insomnia  -She has history of insomnia failed multiple agents such as lunesta, trazodone, tylenol pm, and xanax. -She was prescribed Remeron for her to try on 10/10/17. I previously explained this may also help with her anxiety related to treatment and side effects. -Remeron has helped mildly. She would like to increase dose to '30mg'$ . I called in on 10/31/17 -She reports that Ativan has alleviated her  symptoms thus far and she voiced needing a refill today. Refill provided previously (01/16/18)  4. Hot flash -Her hot flashes are moderate-severe, multiple times a day. She states she was previously on Effexor 22m for depression and had to discontinue due to to the Remeron. She is off Remeron right not.  -Restart 726mEffexor, prescribed today   5. Bone Health  -I briefly discussed today how and aromatase inhibitor can weaken the bone -We will consider getting a DEXA in the future -She will start a Vitamin D supplement    PLAN:  -PET scan reviewed -Continue with Herceptin/Perjeta today and every  3 weeks -start Zoladex injection in the next month -Start Radiation therapy -Start 7588mffexor daily for hot flush  -F/u in 3 weeks    All questions were answered. The patient knows to call the clinic with any problems, questions or concerns.  I spent 20 minutes counseling the patient face to face. The total time spent in the appointment was 25 minutes and more than 50% was on counseling.  This document serves as a record of services personally performed by YanTruitt MerleD. It was created on her behalf by DanTheresia Bough trained medical scribe. The creation of this record is based on the scribe's personal observations and the provider's statements to them.   I have reviewed the above documentation for accuracy and completeness, and I agree with the above.   YanTruitt Merle/22/2019 12:05 PM

## 2018-02-06 ENCOUNTER — Inpatient Hospital Stay: Payer: PRIVATE HEALTH INSURANCE

## 2018-02-06 ENCOUNTER — Encounter: Payer: Self-pay | Admitting: Hematology

## 2018-02-06 ENCOUNTER — Telehealth: Payer: Self-pay | Admitting: Hematology

## 2018-02-06 ENCOUNTER — Inpatient Hospital Stay (HOSPITAL_BASED_OUTPATIENT_CLINIC_OR_DEPARTMENT_OTHER): Payer: PRIVATE HEALTH INSURANCE | Admitting: Hematology

## 2018-02-06 VITALS — BP 96/67 | HR 123 | Temp 97.9°F | Resp 20 | Ht 66.0 in | Wt 161.4 lb

## 2018-02-06 DIAGNOSIS — Z9013 Acquired absence of bilateral breasts and nipples: Secondary | ICD-10-CM | POA: Diagnosis not present

## 2018-02-06 DIAGNOSIS — E7800 Pure hypercholesterolemia, unspecified: Secondary | ICD-10-CM | POA: Diagnosis not present

## 2018-02-06 DIAGNOSIS — C50411 Malignant neoplasm of upper-outer quadrant of right female breast: Secondary | ICD-10-CM

## 2018-02-06 DIAGNOSIS — Z803 Family history of malignant neoplasm of breast: Secondary | ICD-10-CM

## 2018-02-06 DIAGNOSIS — E559 Vitamin D deficiency, unspecified: Secondary | ICD-10-CM

## 2018-02-06 DIAGNOSIS — Z170 Estrogen receptor positive status [ER+]: Secondary | ICD-10-CM

## 2018-02-06 DIAGNOSIS — Z95828 Presence of other vascular implants and grafts: Secondary | ICD-10-CM

## 2018-02-06 DIAGNOSIS — R232 Flushing: Secondary | ICD-10-CM | POA: Diagnosis not present

## 2018-02-06 DIAGNOSIS — Z1501 Genetic susceptibility to malignant neoplasm of breast: Secondary | ICD-10-CM | POA: Diagnosis not present

## 2018-02-06 DIAGNOSIS — F419 Anxiety disorder, unspecified: Secondary | ICD-10-CM

## 2018-02-06 DIAGNOSIS — R12 Heartburn: Secondary | ICD-10-CM

## 2018-02-06 DIAGNOSIS — R5383 Other fatigue: Secondary | ICD-10-CM | POA: Diagnosis not present

## 2018-02-06 DIAGNOSIS — G4700 Insomnia, unspecified: Secondary | ICD-10-CM

## 2018-02-06 DIAGNOSIS — Z9221 Personal history of antineoplastic chemotherapy: Secondary | ICD-10-CM

## 2018-02-06 DIAGNOSIS — Z8701 Personal history of pneumonia (recurrent): Secondary | ICD-10-CM

## 2018-02-06 DIAGNOSIS — K219 Gastro-esophageal reflux disease without esophagitis: Secondary | ICD-10-CM

## 2018-02-06 DIAGNOSIS — M5137 Other intervertebral disc degeneration, lumbosacral region: Secondary | ICD-10-CM

## 2018-02-06 DIAGNOSIS — Z79899 Other long term (current) drug therapy: Secondary | ICD-10-CM

## 2018-02-06 DIAGNOSIS — Z5112 Encounter for antineoplastic immunotherapy: Secondary | ICD-10-CM | POA: Diagnosis not present

## 2018-02-06 LAB — COMPREHENSIVE METABOLIC PANEL
ALT: 22 U/L (ref 0–55)
AST: 19 U/L (ref 5–34)
Albumin: 3.9 g/dL (ref 3.5–5.0)
Alkaline Phosphatase: 73 U/L (ref 40–150)
Anion gap: 9 (ref 3–11)
BUN: 12 mg/dL (ref 7–26)
CHLORIDE: 107 mmol/L (ref 98–109)
CO2: 25 mmol/L (ref 22–29)
CREATININE: 0.89 mg/dL (ref 0.60–1.10)
Calcium: 10 mg/dL (ref 8.4–10.4)
GFR calc Af Amer: >60 mL/min (ref >60)
GFR calc non Af Amer: >60 mL/min (ref >60)
GLUCOSE: 104 mg/dL (ref 70–140)
Potassium: 4 mmol/L (ref 3.5–5.1)
SODIUM: 141 mmol/L (ref 136–145)
Total Bilirubin: 0.5 mg/dL (ref 0.2–1.2)
Total Protein: 7.1 g/dL (ref 6.4–8.3)

## 2018-02-06 LAB — CBC WITH DIFFERENTIAL/PLATELET
Basophils Absolute: 0 10*3/uL (ref 0.0–0.1)
Basophils Relative: 1 %
EOS PCT: 4 %
Eosinophils Absolute: 0.2 10*3/uL (ref 0.0–0.5)
HCT: 38.6 % (ref 34.8–46.6)
HEMOGLOBIN: 12.7 g/dL (ref 11.6–15.9)
LYMPHS ABS: 1.3 10*3/uL (ref 0.9–3.3)
Lymphocytes Relative: 33 %
MCH: 30.3 pg (ref 25.1–34.0)
MCHC: 32.8 g/dL (ref 31.5–36.0)
MCV: 92.3 fL (ref 79.5–101.0)
MONO ABS: 0.3 10*3/uL (ref 0.1–0.9)
MONOS PCT: 7 %
Neutro Abs: 2.1 10*3/uL (ref 1.5–6.5)
Neutrophils Relative %: 55 %
Platelets: 181 10*3/uL (ref 145–400)
RBC: 4.18 MIL/uL (ref 3.70–5.45)
RDW: 16.4 % — ABNORMAL HIGH (ref 11.2–14.5)
WBC: 3.9 10*3/uL (ref 3.9–10.3)

## 2018-02-06 MED ORDER — SODIUM CHLORIDE 0.9 % IV SOLN
Freq: Once | INTRAVENOUS | Status: AC
  Administered 2018-02-06: 12:00:00 via INTRAVENOUS

## 2018-02-06 MED ORDER — DIPHENHYDRAMINE HCL 25 MG PO CAPS
ORAL_CAPSULE | ORAL | Status: AC
Start: 2018-02-06 — End: 2018-02-06
  Filled 2018-02-06: qty 1

## 2018-02-06 MED ORDER — SODIUM CHLORIDE 0.9% FLUSH
10.0000 mL | INTRAVENOUS | Status: DC | PRN
  Administered 2018-02-06: 10 mL
  Filled 2018-02-06: qty 10

## 2018-02-06 MED ORDER — DIPHENHYDRAMINE HCL 25 MG PO CAPS
50.0000 mg | ORAL_CAPSULE | Freq: Once | ORAL | Status: AC
  Administered 2018-02-06: 50 mg via ORAL

## 2018-02-06 MED ORDER — VENLAFAXINE HCL ER 75 MG PO CP24: 75.0000 mg | ORAL_CAPSULE | Freq: Every day | ORAL | 2 refills | Status: DC

## 2018-02-06 MED ORDER — SODIUM CHLORIDE 0.9 % IV SOLN
420.0000 mg | Freq: Once | INTRAVENOUS | Status: AC
  Administered 2018-02-06: 420 mg via INTRAVENOUS
  Filled 2018-02-06: qty 14

## 2018-02-06 MED ORDER — HEPARIN SOD (PORK) LOCK FLUSH 100 UNIT/ML IV SOLN
500.0000 [IU] | Freq: Once | INTRAVENOUS | Status: AC | PRN
  Administered 2018-02-06: 500 [IU]
  Filled 2018-02-06: qty 5

## 2018-02-06 MED ORDER — SODIUM CHLORIDE 0.9% FLUSH
10.0000 mL | Freq: Once | INTRAVENOUS | Status: AC
  Administered 2018-02-06: 10 mL
  Filled 2018-02-06: qty 10

## 2018-02-06 MED ORDER — DIPHENHYDRAMINE HCL 25 MG PO CAPS
ORAL_CAPSULE | ORAL | Status: AC
  Filled 2018-02-06: qty 1

## 2018-02-06 MED ORDER — ACETAMINOPHEN 325 MG PO TABS
ORAL_TABLET | ORAL | Status: AC
  Filled 2018-02-06: qty 2

## 2018-02-06 MED ORDER — ACETAMINOPHEN 325 MG PO TABS
650.0000 mg | ORAL_TABLET | Freq: Once | ORAL | Status: AC
  Administered 2018-02-06: 650 mg via ORAL

## 2018-02-06 MED ORDER — SODIUM CHLORIDE 0.9 % IV SOLN
450.0000 mg | Freq: Once | INTRAVENOUS | Status: AC
  Administered 2018-02-06: 450 mg via INTRAVENOUS
  Filled 2018-02-06: qty 21.43

## 2018-02-06 NOTE — Telephone Encounter (Signed)
Scheduled appt per 3/22 los - Gave patiented calender per los.

## 2018-02-06 NOTE — Patient Instructions (Signed)
[facility] Discharge Instructions for Patients Receiving Chemotherapy  Today you received the following chemotherapy agents :  Herceptin, Perjeta.  To help prevent nausea and vomiting after your treatment, we encourage you to take your nausea medication as prescribed.   If you develop nausea and vomiting that is not controlled by your nausea medication, call the clinic.   BELOW ARE SYMPTOMS THAT SHOULD BE REPORTED IMMEDIATELY:  *FEVER GREATER THAN 100.5 F  *CHILLS WITH OR WITHOUT FEVER  NAUSEA AND VOMITING THAT IS NOT CONTROLLED WITH YOUR NAUSEA MEDICATION  *UNUSUAL SHORTNESS OF BREATH  *UNUSUAL BRUISING OR BLEEDING  TENDERNESS IN MOUTH AND THROAT WITH OR WITHOUT PRESENCE OF ULCERS  *URINARY PROBLEMS  *BOWEL PROBLEMS  UNUSUAL RASH Items with * indicate a potential emergency and should be followed up as soon as possible.  Feel free to call the clinic should you have any questions or concerns. The clinic phone number is [phone].  Please show the CHEMO ALERT CARD at check-in to the Emergency Department and triage nurse.   

## 2018-02-09 ENCOUNTER — Encounter: Payer: Self-pay | Admitting: *Deleted

## 2018-02-10 ENCOUNTER — Ambulatory Visit
Admission: RE | Admit: 2018-02-10 | Discharge: 2018-02-10 | Disposition: A | Discharge status: Home / Self Care | Payer: PRIVATE HEALTH INSURANCE | Source: Ambulatory Visit | Attending: Radiation Oncology | Admitting: Radiation Oncology

## 2018-02-10 ENCOUNTER — Other Ambulatory Visit: Payer: Self-pay

## 2018-02-10 ENCOUNTER — Encounter: Payer: Self-pay | Admitting: Radiation Oncology

## 2018-02-10 VITALS — BP 101/77 | HR 121 | Temp 98.5°F | Resp 18 | Ht 66.0 in | Wt 158.0 lb

## 2018-02-10 DIAGNOSIS — Z87891 Personal history of nicotine dependence: Secondary | ICD-10-CM | POA: Diagnosis not present

## 2018-02-10 DIAGNOSIS — Z79899 Other long term (current) drug therapy: Secondary | ICD-10-CM | POA: Diagnosis not present

## 2018-02-10 DIAGNOSIS — Z170 Estrogen receptor positive status [ER+]: Secondary | ICD-10-CM | POA: Diagnosis not present

## 2018-02-10 DIAGNOSIS — C50411 Malignant neoplasm of upper-outer quadrant of right female breast: Secondary | ICD-10-CM | POA: Insufficient documentation

## 2018-02-10 DIAGNOSIS — Z7401 Bed confinement status: Secondary | ICD-10-CM | POA: Diagnosis not present

## 2018-02-10 DIAGNOSIS — Z9013 Acquired absence of bilateral breasts and nipples: Secondary | ICD-10-CM | POA: Diagnosis not present

## 2018-02-10 NOTE — Progress Notes (Signed)
Radiation Oncology         [phone] ________________________________  Name: Sheila [name]        MRN: [id number]  Date of Service: 02/10/2018 DOB: 25-Jul-1976  CC:[name], MD  [name], MD     REFERRING PHYSICIAN: [name], MD   DIAGNOSIS: The encounter diagnosis was Malignant neoplasm of upper-outer quadrant of right breast in female, estrogen receptor positive ([place]).   HISTORY OF PRESENT ILLNESS: Sheila [name] is a 42 y.o. female originally seen in the multidisciplinary breast clinic for a new diagnosis of right breast cancer. She noted a palpable lump in the right breast as well as nipple and skin retraction. She underwent diagnostic mammogram and ultrasound revealing a 5.5 cm mass in the right breast. Ultrasound of the axilla was negative for adenopathy. She had a biopsy on 06/11/17 of the breast which revealed a grade 2, invasive ductal carcinoma, ER/PR positive, with HER2 amplified with a  2.5 ratio, and a Ki67 was 12%. She underwent bilateral mastectomy on 07/22/17, and her breast on the left revealed fibroadenoma. The right revealed mixed ivnasive lobular and ductal carcinoma grade 2, measuring 5.3 cm, with an additional span of 2.2 cm grade 2 lobular carcinoma, and intermediate grade DCIS. The invasive component came 2-3 mm from the deep margin focally and focally involved the epidermis. LVSI was noted and 4/10 nodes were positive for disease. She began chemotherapy on 08/29/17 and completed 12/12/17. Her progress was held up by neutropenia and flu with pneumonia in February 2019. She will continue herceptin/perjeta for the next 6 months as well. She comes today for further discussion of adjuvant radiotherapy.   PREVIOUS RADIATION THERAPY: No   PAST MEDICAL HISTORY:  Past Medical History:  Diagnosis Date  . Anxiety   . Cancer [facility])    right breast   . Family history of breast cancer   . Family history of colon cancer   . GERD (gastroesophageal reflux disease)     . Headache    couple of migraines in the past  . History of kidney stones   . Vitamin D deficiency        PAST SURGICAL HISTORY: Past Surgical History:  Procedure Laterality Date  . egg donation    . kidney stone removal    . MASTECTOMY W/ SENTINEL NODE BIOPSY Bilateral 07/22/2017   Procedure: RIGHT TOTAL MASTECTOMY WITH RIGHT AXILLARY SENTINEL LYMPH NODE BIOPSY, LEFT PROPHYLACTIC MASTECTOMY;  Surgeon: [name], MD;  Location: [facility];  Service: General;  Laterality: Bilateral;  . PORTACATH PLACEMENT Right 07/22/2017   Procedure: INSERTION PORT-A-CATH WITH [name];  Surgeon: [name], MD;  Location: [facility];  Service: General;  Laterality: Right;     FAMILY HISTORY:  Family History  Problem Relation Age of Onset  . Breast cancer Paternal Grandmother 80  . Colon cancer Paternal Grandmother 70  . Breast cancer Mother 70  . Prostate cancer Paternal Grandfather        dx in his 71s  . Leukemia Paternal Grandfather   . Lung cancer Maternal Uncle        heavy smoker  . Other Paternal Uncle        farm accident  . Stroke Maternal Grandfather      SOCIAL HISTORY:  reports that she has never smoked. She has quit using smokeless tobacco. She reports that she drinks alcohol. She reports that she does not use drugs. The patient is in a common law marriage and lives in [place].  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   ALLERGIES: Patient has no known allergies.   MEDICATIONS:  Current Outpatient Medications  Medication Sig Dispense Refill  . diphenoxylate-atropine (LOMOTIL) 2.5-0.025 MG tablet Take 1-2 tablets by mouth 4 (four) times daily as needed for diarrhea or loose stools. 30 tablet 2  . fluticasone (FLONASE) 50 MCG/ACT nasal spray Place 1 spray into both nostrils daily.    Marland Kitchen gabapentin (NEURONTIN) 100 MG capsule Take 1 capsule (100 mg total) by mouth at bedtime. 30 capsule 0  . lidocaine-prilocaine (EMLA) cream Apply to affected area once (Patient taking  differently: Apply 1 application topically once as needed (port access). Apply to affected area once) 30 g 3  . LORazepam (ATIVAN) 0.5 MG tablet Take 1 tablet (0.5 mg total) by mouth every 8 (eight) hours as needed for anxiety or sleep (or nausea). 30 tablet 0  . naproxen sodium (ANAPROX) 220 MG tablet Take 220 mg 2 (two) times daily as needed by mouth (pain).     . prochlorperazine (COMPAZINE) 10 MG tablet Take 1 tablet (10 mg total) by mouth every 6 (six) hours as needed (Nausea or vomiting). 30 tablet 1  . venlafaxine XR (EFFEXOR-XR) 75 MG 24 hr capsule Take 1 capsule (75 mg total) by mouth daily with breakfast. 30 capsule 2  . Cholecalciferol (VITAMIN D) 2000 units tablet Take 2,000 Units by mouth daily.    Marland Kitchen loperamide (IMODIUM) 2 MG capsule Take 1 capsule (2 mg total) by mouth 3 (three) times daily as needed for diarrhea or loose stools. (Patient not taking: Reported on 02/06/2018) 30 capsule 0  . mirtazapine (REMERON) 30 MG tablet Take 30 mg by mouth at bedtime.     Current Facility-Administered Medications  Medication Dose Route Frequency Provider Last Rate Last Dose  . gi cocktail (Maalox,Lidocaine,Donnatal)  30 mL Oral Once Harle Stanford., PA-C         REVIEW OF SYSTEMS: On review of systems, the patient reports that she is doing well overall but having symptoms of menopause with hot flashes regularly. She denies any significant neuropathy at this time, and reports she's tired and fatigued. She  denies any chest pain, shortness of breath, cough, fevers, chills, night sweats, unintended weight changes. She denies any bowel or bladder disturbances, and denies abdominal pain, nausea or vomiting. She denies any new musculoskeletal or joint aches or pains. A complete review of systems is obtained and is otherwise negative.     PHYSICAL EXAM:  Wt Readings from Last 3 Encounters:  02/10/18 158 lb (71.7 kg)  02/06/18 161 lb 6.4 oz (73.2 kg)  01/16/18 161 lb 8 oz (73.3 kg)   Temp Readings  from Last 3 Encounters:  02/10/18 98.5 F (36.9 C) (Oral)  02/06/18 97.9 F (36.6 C) (Oral)  01/16/18 98.7 F (37.1 C) (Oral)   BP Readings from Last 3 Encounters:  02/10/18 101/77  02/06/18 96/67  01/16/18 114/78   Pulse Readings from Last 3 Encounters:  02/10/18 (!) 121  02/06/18 (!) 123  01/16/18 99    In general this is a well appearing caucasian female in no acute distress. She is alert and oriented x4 and appropriate throughout the examination. HEENT reveals that the patient is normocephalic, atraumatic. EOMs are intact. PERRLA. Skin is intact without any evidence of gross lesions.  Cardiopulmonary assessment is negative for acute distress and she exhibits normal effort. The right mastectomy scar is well healed and her lateral aspect of the incision is somewhat dog  eared. No erythema or chest wall edema is noted.   ECOG = 1  0 - Asymptomatic (Fully active, able to carry on all predisease activities without restriction)  1 - Symptomatic but completely ambulatory (Restricted in physically strenuous activity but ambulatory and able to carry out work of a light or sedentary nature. For example, light housework, office work)  2 - Symptomatic, <50% in bed during the day (Ambulatory and capable of all self care but unable to carry out any work activities. Up and about more than 50% of waking hours)  3 - Symptomatic, >50% in bed, but not bedbound (Capable of only limited self-care, confined to bed or chair 50% or more of waking hours)  4 - Bedbound (Completely disabled. Cannot carry on any self-care. Totally confined to bed or chair)  5 - Death   Eustace Pen MM, Creech RH, Tormey DC, et al. [phone]). "Toxicity and response criteria of the [facility]". [place] Oncol. 5 (6): 649-55    LABORATORY DATA:  Lab Results  Component Value Date   WBC 3.9 02/06/2018   HGB 12.7 02/06/2018   HCT 38.6 02/06/2018   MCV 92.3 02/06/2018   PLT 181 02/06/2018   Lab  Results  Component Value Date   NA 141 02/06/2018   K 4.0 02/06/2018   CL 107 02/06/2018   CO2 25 02/06/2018   Lab Results  Component Value Date   ALT 22 02/06/2018   AST 19 02/06/2018   ALKPHOS 73 02/06/2018   BILITOT 0.5 02/06/2018      RADIOGRAPHY: Nm Pet Image Initial (pi) Skull Base To Thigh  Result Date: 02/05/2018 CLINICAL DATA:  Subsequent treatment strategy for right breast cancer. EXAM: NUCLEAR MEDICINE PET SKULL BASE TO THIGH TECHNIQUE: 8.24 mCi F-18 FDG was injected intravenously. Full-ring PET imaging was performed from the skull base to thigh after the radiotracer. CT data was obtained and used for attenuation correction and anatomic localization. Fasting blood glucose: 102 mg/dl Mediastinal blood pool activity: SUV max 2.7 COMPARISON:  Multiple exams, including CT exams from 12/30/2017 and 12/29/2017 FINDINGS: NECK: Low-grade symmetric palatine tonsillar activity bilaterally, likely physiologic. Symmetric glottic activity, likely physiologic. Suboccipital muscular activity, again thought to be physiologic. There is activity in the adipose tissues of the lower neck without CT correlate, compatible with hypermetabolic brown fat. No hypermetabolic or pathologic adenopathy identified. Incidental CT findings: Unremarkable CHEST: Right middle lobe nodularity and minimal residual bandlike airspace opacity at the site of the prior consolidation, associated with a punctate and a linear calcification as on image 38/8. Maximum SUV 3.2. Back on 07/02/2017 there is some minimal nodularity in this vicinity; on the CT scans from February 2019 there was surrounding consolidation which today has improved and shows mainly nodularity and some bandlike atelectasis. Calcified right hilar lymph nodes are present, with maximum SUV similar to blood pool. Incidental CT findings: Right Port-A-Cath tip: Lower SVC. Right axillary clips noted. Low-grade activity along right mastectomy site. Small areas of  paraspinal and intercostal activity correspond to adipose density and favor hypermetabolic brown fat. ABDOMEN/PELVIS: Mild likely physiologic activity in the duodenal bulb. No appreciable significant abnormal hypermetabolic activity in the abdomen/pelvis. Incidental CT findings: none SKELETON: No significant abnormal hypermetabolic skeletal activity. Incidental CT findings: none IMPRESSION: 1. The previous consolidation in the right middle lobe currently has a more nodular and reticular appearance with some associated mild atelectasis along its anterior margin. This demonstrates low-grade metabolic activity with maximum SUV 3.2 (background blood pool activity is  2.7). Although conceivably related to low-grade residuum from the patient's malignancy, the presence of calcified right hilar lymph nodes and small calcifications along the nodularity raise suspicion for a low-grade active granulomatous process or atypical infectious process. Surveillance is recommended. 2. Scattered deposits of hypermetabolic but benign metabolically active brown fat. Electronically Signed   By: Van Clines M.D.   On: 02/05/2018 08:22       IMPRESSION/PLAN: 1. Stage IB, pT3N0Mx, grade 2 triple positive invasive ductal carcinoma of the right breast. Dr. Lisbeth Renshaw discusses the prior therapy and her course. He reviews her pathology information and recommends proceeding with adjuvant radiotherapy while she proceeds with herceptin/perjeta and antiestrogen will follow radiotherapy. We discussed the risks, benefits, short, and long term effects of radiotherapy, and the patient is interested in proceeding. Dr. Lisbeth Renshaw discusses the delivery and logistics of radiotherapy and we would anticipate a course of 6 1/2 weeks to the chest wall and regional nodes. Written consent is obtained and placed in the chart, a copy was provided to the patient. She will simulate later this week. 2. Contraception. The patient is amenorrheic and will be  proceeding with Zoladex for continued ovarian suppression.   In a visit lasting 25 minutes, greater than 50% of the time was spent face to face discussing her case, and coordinating the patient's care.  The above documentation reflects my direct findings during this shared patient visit. Please see the separate note by Dr. Lisbeth Renshaw on this date for the remainder of the patient's plan of care.    Carola Rhine, PAC

## 2018-02-11 ENCOUNTER — Encounter: Payer: Self-pay | Admitting: General Practice

## 2018-02-11 NOTE — Progress Notes (Signed)
[place] Psychosocial Distress Screening Clinical Social Work  Clinical Social Work was referred by distress screening protocol.  The patient scored a 8 on the Psychosocial Distress Thermometer which indicates moderate distress. Clinical Social Worker Edwyna Shell to assess for distress and other psychosocial needs. CSW and patient discussed common feeling and emotions when being diagnosed with cancer, and the importance of support during treatment. CSW informed patient of the support team and support services at [facility]. CSW provided contact information and encouraged patient to call with any questions or concerns.  Mailed information packet on [facility] and [employer] and services.    ONCBCN DISTRESS SCREENING 02/10/2018  Screening Type Initial Screening  Distress experienced in past week (1-10) 8  Practical problem type Insurance;Work/school  Emotional problem type Depression;Nervousness/Anxiety;Adjusting to illness;Feeling hopeless  Spiritual/Religous concerns type Loss of sense of purpose  Information Concerns Type   Physical Problem type Sleep/insomnia;Getting around;Loss of appetitie  Physician notified of physical symptoms Yes  Referral to support programs     Clinical Social Worker follow up needed: No. Patient can contact [place] as needed.    If yes, follow up plan:  Edwyna Shell, LCSW Clinical Social Worker Phone:  [phone]

## 2018-02-13 ENCOUNTER — Ambulatory Visit
Admission: RE | Admit: 2018-02-13 | Discharge: 2018-02-13 | Disposition: A | Discharge status: Home / Self Care | Payer: PRIVATE HEALTH INSURANCE | Source: Ambulatory Visit | Attending: Radiation Oncology | Admitting: Radiation Oncology

## 2018-02-13 DIAGNOSIS — C50411 Malignant neoplasm of upper-outer quadrant of right female breast: Secondary | ICD-10-CM | POA: Insufficient documentation

## 2018-02-13 DIAGNOSIS — Z170 Estrogen receptor positive status [ER+]: Secondary | ICD-10-CM | POA: Diagnosis not present

## 2018-02-17 ENCOUNTER — Ambulatory Visit (HOSPITAL_COMMUNITY): Payer: PRIVATE HEALTH INSURANCE

## 2018-02-17 ENCOUNTER — Encounter (HOSPITAL_COMMUNITY): Payer: PRIVATE HEALTH INSURANCE | Admitting: Cardiology

## 2018-02-17 NOTE — Progress Notes (Signed)
  Radiation Oncology         [phone] ________________________________  Name: Sheila [name] MRN: [id number]  Date: 02/13/2018  DOB: 10-19-1976  DIAGNOSIS:     ICD-10-CM   1. Malignant neoplasm of upper-outer quadrant of right breast in female, estrogen receptor positive ([place]) C50.411    Z17.0      SIMULATION AND TREATMENT PLANNING NOTE  The patient presented for simulation prior to beginning her course of radiation treatment for her diagnosis of right-sided breast cancer. The patient was placed in a supine position on a breast board. A customized vac-lock bag was also constructed and this complex treatment device will be used on a daily basis during her treatment. In this fashion, a CT scan was obtained through the chest area and an isocenter was placed near the chest wall at the upper aspect of the right chest.  The patient will be planned to receive a course of radiation initially to a dose of 50.4 gray. This will consist of a 4 field technique targeting the right chest wall as well as the supraclavicular region. Therefore 2 customized medial and lateral tangent fields have been created targeting the chest wall, and also 2 additional customized fields have been designed to treat the supraclavicular region both with a right supraclavicular field and a right posterior axillary boost field. A forward planning/reduced field technique will also be evaluated to determine if this significantly improves the dose homogeneity of the overall plan. Therefore, additional customized blocks/fields may be necessary.  This initial treatment will be accomplished at 1.8 gray per fraction.   The initial plan will consist of a 3-D conformal technique. The target volume/scar, heart and lungs have been contoured and dose volume histograms of each of these structures will be evaluated as part of the 3-D conformal treatment planning process.   It is anticipated that the patient will then receive a 10 gray boost  to the surgical scar. This will be accomplished at 2 gray per fraction. The final anticipated total dose therefore will correspond to 60.4 gray.    _______________________________   Jodelle Gross, MD, PhD

## 2018-02-17 NOTE — Progress Notes (Signed)
  Radiation Oncology         [phone]) [phone] ________________________________  Name: Sheila [name] MRN: [id number]  Date: 02/13/2018  DOB: 1976/09/03  Optical Surface Tracking Plan:  Since intensity modulated radiotherapy (IMRT) and 3D conformal radiation treatment methods are predicated on accurate and precise positioning for treatment, intrafraction motion monitoring is medically necessary to ensure accurate and safe treatment delivery.  The ability to quantify intrafraction motion without excessive ionizing radiation dose can only be performed with optical surface tracking. Accordingly, surface imaging offers the opportunity to obtain 3D measurements of patient position throughout IMRT and 3D treatments without excessive radiation exposure.  I am ordering optical surface tracking for this patient's upcoming course of radiotherapy. ________________________________  Kyung Rudd, MD 02/17/2018 10:34 AM    Reference:   Ursula Alert, J, et al. Surface imaging-based analysis of intrafraction motion for breast radiotherapy patients.Journal of [place], n. 6, nov. 2014. ISSN [id number].   Available at: <http://www.jacmp.org/index.php/jacmp/article/view/4957>.

## 2018-02-19 DIAGNOSIS — C50411 Malignant neoplasm of upper-outer quadrant of right female breast: Secondary | ICD-10-CM | POA: Diagnosis not present

## 2018-02-20 ENCOUNTER — Ambulatory Visit
Admission: RE | Admit: 2018-02-20 | Discharge: 2018-02-20 | Disposition: A | Discharge status: Home / Self Care | Payer: BLUE CROSS/BLUE SHIELD | Source: Ambulatory Visit | Attending: Radiation Oncology | Admitting: Radiation Oncology

## 2018-02-20 DIAGNOSIS — Z510 Encounter for antineoplastic radiation therapy: Secondary | ICD-10-CM | POA: Insufficient documentation

## 2018-02-20 DIAGNOSIS — Z170 Estrogen receptor positive status [ER+]: Secondary | ICD-10-CM | POA: Insufficient documentation

## 2018-02-20 DIAGNOSIS — C50411 Malignant neoplasm of upper-outer quadrant of right female breast: Secondary | ICD-10-CM | POA: Diagnosis not present

## 2018-02-23 ENCOUNTER — Ambulatory Visit
Admission: RE | Admit: 2018-02-23 | Discharge: 2018-02-23 | Disposition: A | Discharge status: Home / Self Care | Payer: BLUE CROSS/BLUE SHIELD | Source: Ambulatory Visit | Attending: Radiation Oncology | Admitting: Radiation Oncology

## 2018-02-23 DIAGNOSIS — Z510 Encounter for antineoplastic radiation therapy: Secondary | ICD-10-CM | POA: Diagnosis not present

## 2018-02-23 DIAGNOSIS — Z170 Estrogen receptor positive status [ER+]: Secondary | ICD-10-CM | POA: Diagnosis not present

## 2018-02-23 DIAGNOSIS — C50411 Malignant neoplasm of upper-outer quadrant of right female breast: Secondary | ICD-10-CM | POA: Diagnosis not present

## 2018-02-24 ENCOUNTER — Ambulatory Visit
Admission: RE | Admit: 2018-02-24 | Discharge: 2018-02-24 | Disposition: A | Discharge status: Home / Self Care | Payer: BLUE CROSS/BLUE SHIELD | Source: Ambulatory Visit | Attending: Radiation Oncology | Admitting: Radiation Oncology

## 2018-02-24 DIAGNOSIS — Z170 Estrogen receptor positive status [ER+]: Secondary | ICD-10-CM | POA: Diagnosis not present

## 2018-02-24 DIAGNOSIS — Z510 Encounter for antineoplastic radiation therapy: Secondary | ICD-10-CM | POA: Diagnosis not present

## 2018-02-24 DIAGNOSIS — C50411 Malignant neoplasm of upper-outer quadrant of right female breast: Secondary | ICD-10-CM | POA: Diagnosis not present

## 2018-02-25 ENCOUNTER — Ambulatory Visit
Admission: RE | Admit: 2018-02-25 | Discharge: 2018-02-25 | Disposition: A | Discharge status: Home / Self Care | Payer: BLUE CROSS/BLUE SHIELD | Source: Ambulatory Visit | Attending: Radiation Oncology | Admitting: Radiation Oncology

## 2018-02-25 DIAGNOSIS — C50411 Malignant neoplasm of upper-outer quadrant of right female breast: Secondary | ICD-10-CM | POA: Diagnosis not present

## 2018-02-25 DIAGNOSIS — Z170 Estrogen receptor positive status [ER+]: Secondary | ICD-10-CM | POA: Diagnosis not present

## 2018-02-25 DIAGNOSIS — Z510 Encounter for antineoplastic radiation therapy: Secondary | ICD-10-CM | POA: Diagnosis not present

## 2018-02-26 ENCOUNTER — Ambulatory Visit
Admission: RE | Admit: 2018-02-26 | Discharge: 2018-02-26 | Disposition: A | Discharge status: Home / Self Care | Payer: BLUE CROSS/BLUE SHIELD | Source: Ambulatory Visit | Attending: Radiation Oncology | Admitting: Radiation Oncology

## 2018-02-26 ENCOUNTER — Other Ambulatory Visit: Payer: Self-pay | Admitting: *Deleted

## 2018-02-26 DIAGNOSIS — C50411 Malignant neoplasm of upper-outer quadrant of right female breast: Secondary | ICD-10-CM | POA: Diagnosis not present

## 2018-02-26 DIAGNOSIS — Z170 Estrogen receptor positive status [ER+]: Secondary | ICD-10-CM | POA: Diagnosis not present

## 2018-02-26 DIAGNOSIS — Z510 Encounter for antineoplastic radiation therapy: Secondary | ICD-10-CM | POA: Diagnosis not present

## 2018-02-26 NOTE — Progress Notes (Signed)
[place]  [phone] [phone]  Clinic Follow Up Note   Patient Care Team: [name], MD as PCP - General (Family Medicine) [name] (Family Medicine) [name], MD as Consulting Physician (General Surgery) [name], MD as Consulting Physician (Hematology) Kyung Rudd, MD as Consulting Physician (Radiation Oncology)   Date of Service:  02/27/2018  CHIEF COMPLAINTS:  Follow up right breast cancer    Oncology History    Cancer Staging Malignant neoplasm of upper-outer quadrant of right breast in female, estrogen receptor positive ([place]) Staging form: Breast, AJCC 8th Edition - Clinical stage from 06/11/2017: Stage IB (cT3, cN0, cM0, G2, ER: Positive, PR: Positive, HER2: Positive) - Signed by [name], MD on 06/16/2017 - Pathologic stage from 07/22/2017: Stage IB (pT3(m), pN2a(sn), cM0, G2, ER: Positive, PR: Positive, HER2: Positive) - Signed by [name], NP on 08/04/2017      Malignant neoplasm of upper-outer quadrant of right breast in female, estrogen receptor positive ([place])   06/11/2017 Initial Biopsy    Diagnosis 06/11/17 Breast, right, needle core biopsy, 11:00 o'clock - INVASIVE DUCTAL CARCINOMA, G2      06/11/2017 Mammogram    [name] and MM Diagnostic Breast Tomo Bilateral 06/11/17 IMPRESSION: 1. Highly suspicious mass within the retroareolar right breast, extending from the 8:00 to 11:00 axes, anterior to posterior depth, extending anteriorly to the nipple with associated nipple retraction, measuring at least 5.5 cm by ultrasound  2. No sonographic evidence of metastatic lymphadenopathy in the right axilla. 3. No evidence of malignancy within the left breast.       06/11/2017 Initial Diagnosis    Malignant neoplasm of upper-outer quadrant of right breast in female, estrogen receptor positive ([place])      06/11/2017 Receptors her2    Estrogen Receptor: 100%, POSITIVE, STRONG STAINING  INTENSITY Progesterone Receptor: 100%, POSITIVE, STRONG STAINING INTENSITY Proliferation Marker Ki67: 12%Proliferation Marker Ki67: 12%  HER2 - **POSITIVE** RATIO OF HER2/CEP17 SIGNALS 2.55 AVERAGE HER2 COPY NUMBER PER CELL 4.85      06/25/2017 Genetic Testing    Negative genetic testing on the 9 gene STAT panel.  The STAT Breast cancer panel offered by [facility] includes sequencing and rearrangement analysis for the following 9 genes:  ATM, BRCA1, BRCA2, CDH1, CHEK2, PALB2, PTEN, STK11 and TP53.   The report date is June 25, 2017.  Negative genetic testing on the common hereditary cancer panel.  The Hereditary Gene Panel offered by [facility] includes sequencing and/or deletion duplication testing of the following 46 genes: APC, ATM, AXIN2, BARD1, BMPR1A, BRCA1, BRCA2, BRIP1, CDH1, CDKN2A (p14ARF), CDKN2A (p16INK4a), CHEK2, CTNNA1, DICER1, EPCAM (Deletion/duplication testing only), GREM1 (promoter region deletion/duplication testing only), KIT, MEN1, MLH1, MSH2, MSH3, MSH6, MUTYH, NBN, NF1, NHTL1, PALB2, PDGFRA, PMS2, POLD1, POLE, PTEN, RAD50, RAD51C, RAD51D, SDHB, SDHC, SDHD, SMAD4, SMARCA4. STK11, TP53, TSC1, TSC2, and VHL.  The following genes were evaluated for sequence changes only: SDHA and HOXB13 c.251G>A variant only.  The report date is June 25, 2017.       07/02/2017 Imaging    CT cAP 07/02/17 IMPRESSION: 1. Subareolar right breast mass. No compelling findings of nodal or metastatic involvement. 2. Old granulomatous disease. 3. There is a 7 mm enhancing or hyperdense focus posteriorly in segment 7 of the liver in the subcapsular region. By virtue of its small size this lesion is technically nonspecific although statistically likely to be a small benign lesions such as flash filling hemangioma. This may warrant surveillance. 4.  Prominent stool throughout  the colon favors constipation. 5. Degenerative disc disease and spondylosis at L5-S1 likely causing mild impingement.       07/02/2017 Imaging    Bone scan 07/02/17 IMPRESSION: Today' s exam is negative. I ascribed the tiny focus of activity just proximal to the left antecubital region to injection site.      07/22/2017 Surgery    RIGHT TOTAL MASTECTOMY WITH RIGHT AXILLARY SENTINEL LYMPH NODE BIOPSY, LEFT PROPHYLACTIC MASTECTOM and INSERTION PORT-A-CATH WITH [name] by [name] and [facility]       07/22/2017 Pathology Results    Diagnosis 1. Breast, simple mastectomy, Left - FIBROADENOMA. - NO MALIGNANCY IDENTIFIED. 2. 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 - ADDITIONAL FOCUS OF LOBULAR CARCINOMA, GRADE 2, SPANNING 2.2 CM. - INTERMEDIATE GRADE DUCTAL CARCINOMA IN SITU. - INVASIVE CARCINOMA COMES TO WITHIN 0.2 TO 0.3 CM OF THE DEEP MARGIN, FOCALLY. - TUMOR FOCALLY INVOLVES EPIDERMIS. - LYMPHOVASCULAR INVASION OF DERMAL LYMPHATICS. - ONE OF ONE LYMPH NODES NEGATIVE FOR CARCINOMA (0/1). - SEE ONCOLOGY TABLE. 3. Lymph node, sentinel, biopsy, Right axillary - ONE LYMPH NODE WITH ISOLATED TUMOR CELLS (0/1). 4. Lymph node, sentinel, biopsy, Right - METASTATIC CARCINOMA IN ONE OF ONE LYMPH NODES (1/1). 5. Lymph node, sentinel, biopsy, Right - ONE OF ONE LYMPH NODES NEGATIVE FOR CARCINOMA (0/1). 6. Lymph node, sentinel, biopsy, Right - METASTATIC CARCINOMA IN ONE OF ONE LYMPH NODES (1/1). - EXTRACAPSULAR EXTENSION. 7. Lymph node, sentinel, biopsy, Right - METASTATIC CARCINOMA IN ONE OF ONE LYMPH NODES (1/1). - EXTRACAPSULAR EXTENSION. 8. Lymph node, sentinel, biopsy, Right - ONE OF ONE LYMPH NODES NEGATIVE FOR CARCINOMA (0/1). 9. Lymph node, sentinel, biopsy, Right - ONE OF ONE LYMPH NODES NEGATIVE FOR CARCINOMA (0/1). 10. Lymph node, sentinel, biopsy, Right - METASTATIC CARCINOMA IN ONE OF ONE LYMPH NODES (1/1). - EXTRACAPSULAR EXTENSION. 11. Lymph node, biopsy, Right axillary - ONE OF ONE LYMPH NODES NEGATIVE FOR CARCINOMA (0/1).        07/22/2017 Receptors her2    Ductal carcinoma in primary breast tumor and node metastasis are ER 95%, PR 95% strongly positive, and HER2 + The lobular component in primary breast tumor is ER 95% positive, PR 95% positive, strong staining, HER-2 negative.       08/25/2017 Imaging    MRI abdomen done at [facility]:  Enhancing structure measing 5 mm is compatible with a flash filling hemangioma. No specific findings to suggest metastatic cancer      08/25/2017 Imaging    MRI Abdomen at [facility] 08/25/17  IMPRESSION: 1. Lesion in segment 7 of the liver has signal and enhancement characteristics compatible with a flash filling hemangioma. No specific findings identified to suggest metastatic disease.      08/29/2017 - 12/12/2017 Chemotherapy    adjuvant TCHP every 3 weeks for 6 cycles starting 08/29/17, followed by maintenance Herceptin with or without Perjeta for 6-12 months. Postponed and decreased Carbo to 675m and added Onpro with cycle 2 on 09/19/17 due to neutropenic fever. Carbplatin further reduced to 5075mstarting with cycle 3 due to thrombocytopenia. Herceptin was held for Cycle 4 and 5 due to worsening global longitudinal strain found on ECHO from 11/07/17.          09/04/2017 - 09/09/2017 Hospital Admission    Admit date: 09/04/2017 Discharge date: 09/09/2017  DISCHARGE DIAGNOSES:  Principal Problem:   [facility]:   Malignant neoplasm of upper-outer quadrant of right breast in female, estrogen receptor positive (  [place])   Port-A-Cath in place   Neutropenic fever ([place])   Hyponatremia   Malignant neoplasm of breast in female, estrogen receptor positive ([place])       12/28/2017 Imaging    CT CHEST IMPRESSION: 1. 2.3 x 2.7 x 1.2 cm irregular masslike opacity within the right middle lobe worrisome for malignancy/metastasis. 2. 3 new very small sclerotic lesions, within the sternum and the body of T4, worrisome for new metastases.      12/28/2017 - 01/05/2018  Hospital Admission    Admit date: 12/28/17-01/05/18 Admission diagnosis: multifocal pneumonia, influenza A, sepsis, hypoxia Additional comments: imaging during hospitalization notable for new irregular masslike opacity in the right middle lobe worrisome for metastasis and new very small sclerotic lesion within the sternum and the body of T4, worrisome for metastasis      12/29/2017 Imaging    CT ABD/PELVIS IMPRESSION: No definite acute intra-abdominal or intrapelvic abnormalities.  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      12/30/2017 Imaging    CT HEAD IMPRESSION: No acute intracranial abnormality or enhancing lesion.      02/04/2018 PET scan    IMPRESSION: 1. The previous consolidation in the right middle lobe currently has a more nodular and reticular appearance with some associated mild atelectasis along its anterior margin. This demonstrates low-grade metabolic activity with maximum SUV 3.2 (background blood pool activity is 2.7). Although conceivably related to low-grade residuum from the patient's malignancy, the presence of calcified right hilar lymph nodes and small calcifications along the nodularity raise suspicion for a low-grade active granulomatous process or atypical infectious process. Surveillance is recommended. 2. Scattered deposits of hypermetabolic but benign metabolically active brown fat.      HISTORY OF PRESENTING ILLNESS: [birth year] Suan Halter 42 y.o. female is here because of newly diagnosed Malignant neoplasm of upper-outer quadrant of right breast. She presents to the breast clinic today with her common law husband. She felt the lump initially when she had her physical 2 weeks ago, she had felt her nipples retracted several years ago. A few months ago it has become more pronounced. She had no pain or discharge.   In the past was diagnosed with Vitamin D deficiency and high cholesterol. She previously was on mood  stabilizing medication. She feel right now she does not need that medication but is overwhelmed. She donated eggs and previously had kidney stones removed. Her mother had breast cancer and was diagnosed at 21 along with paternal grandmother in her 39's.   Today she has heart burn which she is taking Prilosec and will see her GI later today. She has trouble sleeping and uses tylenol pm. She tried [place] which did not help, same with melatonin. Her common law husband Is on disability.  GYN HISTORY  Menarchal: 19 LMP: July 23rd, 2018 Contraceptive: 20-27 on birth control pill HRT: NA G0P0: does not want kids  CURRENT THERAPY:  Herceptin and Perjeta maintenance therapy every 3 weeks. Radiation therapy with Dr. Lisbeth Renshaw started on 02/23/18  INTERVAL HISTORY:  Wateen Varon is here for a follow up after starting RT on 02/23/18 and Zoladex injection. She presents to the clinic today accompanied by her husband. She reports she has not had any problems with radiation so far. She reports she is continuing to recover from her hospital stay. Her energy level has improved and she is able to do things at home. She is not back to 100% though. She reports he hot  flashes are greatly improved on Effexor. She does still have a cough but she believes it is seasonal allergy related.   On review of systems, pt denies SOB, pain or any other complaints at this time. Pertinent positives are listed and detailed within the above HPI.  MEDICAL HISTORY:  Past Medical History:  Diagnosis Date  . Anxiety   . Cancer [facility])    right breast   . Family history of breast cancer   . Family history of colon cancer   . GERD (gastroesophageal reflux disease)   . Headache    couple of migraines in the past  . History of kidney stones   . Vitamin D deficiency    SURGICAL HISTORY: Past Surgical History:  Procedure Laterality Date  . egg donation    . kidney stone removal    . MASTECTOMY W/ SENTINEL NODE BIOPSY Bilateral  07/22/2017   Procedure: RIGHT TOTAL MASTECTOMY WITH RIGHT AXILLARY SENTINEL LYMPH NODE BIOPSY, LEFT PROPHYLACTIC MASTECTOMY;  Surgeon: [name], MD;  Location: [place];  Service: General;  Laterality: Bilateral;  . PORTACATH PLACEMENT Right 07/22/2017   Procedure: INSERTION PORT-A-CATH WITH [name];  Surgeon: [name], MD;  Location: [place];  Service: General;  Laterality: Right;   SOCIAL HISTORY: Social History   Socioeconomic History  . Marital status: Single    Spouse name: Not on file  . Number of children: Not on file  . Years of education: Not on file  . Highest education level: Not on file  Occupational History  . Not on file  Social Needs  . Financial resource strain: Not on file  . Food insecurity:    Worry: Not on file    Inability: Not on file  . Transportation needs:    Medical: Not on file    Non-medical: Not on file  Tobacco Use  . Smoking status: Never Smoker  . Smokeless tobacco: Former Network engineer and Sexual Activity  . Alcohol use: Yes    Comment: once a year  . Drug use: No  . Sexual activity: Yes  Lifestyle  . Physical activity:    Days per week: Not on file    Minutes per session: Not on file  . Stress: Not on file  Relationships  . Social connections:    Talks on phone: Not on file    Gets together: Not on file    Attends religious service: Not on file    Active member of club or organization: Not on file    Attends meetings of clubs or organizations: Not on file    Relationship status: Not on file  . Intimate partner violence:    Fear of current or ex partner: Not on file    Emotionally abused: Not on file    Physically abused: Not on file    Forced sexual activity: Not on file  Other Topics Concern  . Not on file  Social History Narrative  . Not on file   FAMILY HISTORY: Family History  Problem Relation Age of Onset  . Breast cancer Paternal Grandmother 69  . Colon cancer Paternal Grandmother 63  . Breast cancer Mother 79   . Prostate cancer Paternal Grandfather        dx in his 83s  . Leukemia Paternal Grandfather   . Lung cancer Maternal Uncle        heavy smoker  . Other Paternal Uncle        farm accident  . Stroke Maternal Grandfather  ALLERGIES:  has No Known Allergies.  MEDICATIONS:  Current Outpatient Medications  Medication Sig Dispense Refill  . Cholecalciferol (VITAMIN D) 2000 units tablet Take 2,000 Units by mouth daily.    . diphenoxylate-atropine (LOMOTIL) 2.5-0.025 MG tablet Take 1-2 tablets by mouth 4 (four) times daily as needed for diarrhea or loose stools. 30 tablet 2  . fluticasone (FLONASE) 50 MCG/ACT nasal spray Place 1 spray into both nostrils daily.    Marland Kitchen lidocaine-prilocaine (EMLA) cream Apply to affected area once (Patient taking differently: Apply 1 application topically once as needed (port access). Apply to affected area once) 30 g 3  . loperamide (IMODIUM) 2 MG capsule Take 1 capsule (2 mg total) by mouth 3 (three) times daily as needed for diarrhea or loose stools. 30 capsule 0  . LORazepam (ATIVAN) 0.5 MG tablet Take 1 tablet (0.5 mg total) by mouth every 8 (eight) hours as needed for anxiety or sleep (or nausea). 15 tablet 0  . naproxen sodium (ANAPROX) 220 MG tablet Take 220 mg 2 (two) times daily as needed by mouth (pain).     . prochlorperazine (COMPAZINE) 10 MG tablet Take 1 tablet (10 mg total) by mouth every 6 (six) hours as needed (Nausea or vomiting). 30 tablet 1  . venlafaxine XR (EFFEXOR-XR) 75 MG 24 hr capsule Take 1 capsule (75 mg total) by mouth daily with breakfast. 30 capsule 2  . zolpidem (AMBIEN) 5 MG tablet Take 1 tablet (5 mg total) by mouth at bedtime as needed for sleep. 20 tablet 0   Current Facility-Administered Medications  Medication Dose Route Frequency Provider Last Rate Last Dose  . gi cocktail (Maalox,Lidocaine,Donnatal)  30 mL Oral Once Harle Stanford., PA-C       Facility-Administered Medications Ordered in Other Visits  Medication Dose  Route Frequency Provider Last Rate Last Dose  . heparin lock flush 100 unit/mL  500 Units Intracatheter Once PRN [name], MD      . pertuzumab (PERJETA) 420 mg in sodium chloride 0.9 % 250 mL chemo infusion  420 mg Intravenous Once [name], MD      . sodium chloride flush (NS) 0.9 % injection 10 mL  10 mL Intracatheter PRN [name], MD      . trastuzumab (HERCEPTIN) 450 mg in sodium chloride 0.9 % 250 mL chemo infusion  450 mg Intravenous Once [name], MD       REVIEW OF SYSTEMS:  Constitutional: Denies fevers, (+) moderate-severe hot flash, improved on Effexor (+) improved energy Eyes: Denies blurriness of vision, double vision or watery eyes Ears, nose, mouth, throat, and face: Denies mucositis or sore throat (+) cough, allergy related Respiratory: Denies cough, dyspnea or wheezes Cardiovascular: Denies palpitation, chest discomfort or lower extremity swelling Gastrointestinal:  Denies nausea, heartburn or change in bowel habits   Skin: Denies abnormal skin rashes Lymphatics: Denies new lymphadenopathy or easy bruising Neurological:Denies numbness, tingling or new weaknesses Behavioral/Psych: Mood is stable, no new changes  Breast: (+) bilateral mastectomy on 07/22/17 All other systems were reviewed with the patient and are negative.  PHYSICAL EXAMINATION:  ECOG PERFORMANCE STATUS: 1 - Symptomatic but completely ambulatory  Vitals:   02/27/18 1306  BP: 100/70  Pulse: (!) 121  Resp: 17  Temp: 98.5 F (36.9 C)  SpO2: 98%   Filed Weights   02/27/18 1306  Weight: 155 lb 6.4 oz (70.5 kg)      GENERAL:alert, no distress and comfortable SKIN: skin color, texture, turgor are normal, no rashes or  significant lesions EYES: normal, conjunctiva are pink and non-injected, sclera clear OROPHARYNX:no exudate, no erythema and lips, buccal mucosa, and tongue normal  NECK: supple, thyroid normal size, non-tender, without nodularity LYMPH:  no palpable lymphadenopathy in the cervical,  axillary or inguinal LUNGS: clear to auscultation and percussion with normal breathing effort HEART: regular rate & rhythm and no murmurs and no lower extremity edema ABDOMEN:abdomen soft, non-tender and normal bowel sounds Musculoskeletal:no cyanosis of digits and no clubbing  PSYCH: alert & oriented x 3 with fluent speech NEURO: no focal motor/sensory deficits Breast: (+)bilateral mastectomy, incisions clean and intact, healed well.  No significant skin erythema on the right chest wall.  LABORATORY DATA:  I have reviewed the data as listed CBC Latest Ref Rng & Units 02/27/2018 02/06/2018 01/16/2018  WBC 3.9 - 10.3 K/uL 3.8(L) 3.9 6.2  Hemoglobin 11.6 - 15.9 g/dL 12.3 12.7 13.1  Hematocrit 34.8 - 46.6 % 36.7 38.6 40.2  Platelets 145 - 400 K/uL 145 181 218   CMP Latest Ref Rng & Units 02/27/2018 02/06/2018 01/16/2018  Glucose 70 - 140 mg/dL 130 104 138  BUN 7 - 26 mg/dL _0 Creatinine 0.60 - 1.10 mg/dL 0.94 0.89 0.91  Sodium 136 - 145 mmol/L 141 141 141  Potassium 3.5 - 5.1 mmol/L 3.7 4.0 3.9  Chloride 98 - 109 mmol/L 108 107 106  CO2 22 - 29 mmol/L _1 Calcium 8.4 - 10.4 mg/dL 9.8 10.0 9.9  Total Protein 6.4 - 8.3 g/dL 7.0 7.1 7.0  Total Bilirubin 0.2 - 1.2 mg/dL 0.7 0.5 0.5  Alkaline Phos 40 - 150 U/L 74 73 63  AST 5 - 34 U/L _2 ALT 0 - 55 U/L _3 PATHOLOGY  Diagnosis 07/22/17 1. Breast, simple mastectomy, Left - FIBROADENOMA. - NO MALIGNANCY IDENTIFIED. 2. 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 - ADDITIONAL FOCUS OF LOBULAR CARCINOMA, GRADE 2, SPANNING 2.2 CM. - INTERMEDIATE GRADE DUCTAL CARCINOMA IN SITU. - INVASIVE CARCINOMA COMES TO WITHIN 0.2 TO 0.3 CM OF THE DEEP MARGIN, FOCALLY. - TUMOR FOCALLY INVOLVES EPIDERMIS. - LYMPHOVASCULAR INVASION OF DERMAL LYMPHATICS. - ONE OF ONE LYMPH NODES NEGATIVE FOR CARCINOMA (0/1). - SEE ONCOLOGY TABLE. 3. Lymph node, sentinel, biopsy, Right  axillary - ONE LYMPH NODE WITH ISOLATED TUMOR CELLS (0/1). 4. Lymph node, sentinel, biopsy, Right - METASTATIC CARCINOMA IN ONE OF ONE LYMPH NODES (1/1). 5. Lymph node, sentinel, biopsy, Right - ONE OF ONE LYMPH NODES NEGATIVE FOR CARCINOMA (0/1). 6. Lymph node, sentinel, biopsy, Right - METASTATIC CARCINOMA IN ONE OF ONE LYMPH NODES (1/1). - EXTRACAPSULAR EXTENSION. 7. Lymph node, sentinel, biopsy, Right - METASTATIC CARCINOMA IN ONE OF ONE LYMPH NODES (1/1). - EXTRACAPSULAR EXTENSION. 8. Lymph node, sentinel, biopsy, Right - ONE OF ONE LYMPH NODES NEGATIVE FOR CARCINOMA (0/1). 9. Lymph node, sentinel, biopsy, Right - ONE OF ONE LYMPH NODES NEGATIVE FOR CARCINOMA (0/1). 10. Lymph node, sentinel, biopsy, Right - METASTATIC CARCINOMA IN ONE OF ONE LYMPH NODES (1/1). - EXTRACAPSULAR EXTENSION. 11. Lymph node, biopsy, Right axillary - ONE OF ONE LYMPH NODES NEGATIVE FOR CARCINOMA (0/1). Microscopic Comment 2. BREAST, INVASIVE TUMOR Microscopic Comment(continued) Procedure: Bilateral simple mastectomies with right axillary sentinel lymph node biopsies. Laterality: Right. Tumor Size: 5.3 cm. Histologic Type: Mixed lobular and ductal carcinoma. Grade: 2 Tubular Differentiation: 2 Nuclear Pleomorphism: 3 Mitotic Count: 1 Ductal Carcinoma in Situ (DCIS): Present, intermediate grade. Extent of  Tumor: Involves epidermis of nipple focally. Margins: Invasive carcinoma, distance from closest margin: 0.2-0.3 cm of posterior margin (smaller mass). DCIS, distance from closest margin: >0.5 cm all margins Regional Lymph Nodes: Number of Lymph Nodes Examined: 10 Number of Sentinel Lymph Nodes Examined: 9 Lymph Nodes with Macrometastases: 4 Lymph Nodes with Micrometastases: 0 Lymph Nodes with Isolated Tumor Cells: 1 Breast Prognostic Profile: Performed on biopsy ([id number]), see below. Will be performed on additional lobular focus. Estrogen Receptor: Positive, 100% strong  staining. Progesterone Receptor: Positive, 100% strong staining. Her2: Positive (ratio 2.55). Ki-67: 12%. Best tumor block for sendout testing: 2B (larger focus), 2E (smaller focus). Pathologic Stage Classification (pTNM, AJCC 8th Edition): Primary Tumor (pT): mpT3 Regional Lymph Nodes (pN): pN2a Distant Metastases (pM): pMX  2. FLUORESCENCE IN-SITU HYBRIDIZATION Results: HER2 - NEGATIVE RATIO OF HER2/CEP17 SIGNALS 1.71 AVERAGE HER2 COPY NUMBER PER CELL 1.80 2. PROGNOSTIC INDICATORS Results: IMMUNOHISTOCHEMICAL AND MORPHOMETRIC ANALYSIS PERFORMED MANUALLY Estrogen Receptor: 95%, POSITIVE, STRONG STAINING INTENSITY Progesterone Receptor: 95%, POSITIVE, STRONG STAINING INTENSITY Proliferation Marker Ki67: 10%  4. FLUORESCENCE IN-SITU HYBRIDIZATION Results: HER2 - **POSITIVE**. OF NOTE, A TOTAL OF 40 TUMOR CELLS WERE EVALUATED FOR HER2 EXPRESSION. RATIO OF HER2/CEP17 SIGNALS 2.01 AVERAGE HER2 COPY NUMBER PER CELL 3.63  Estrogen Receptor: 95%, POSITIVE, STRONG STAINING INTENSITY Progesterone Receptor: 95%, POSITIVE, STRONG STAINING INTENSITY  Addendum 1. By immunohistochemistry, the tumor cells in part 2 are Negative for Her2 (1+).  2. The current prognostic profile was performed on the additional focus of pure lobular carcinoma.  Diagnosis 06/11/17 Breast, right, needle core biopsy, 11:00 o'clock - INVASIVE DUCTAL CARCINOMA, SEE COMMENT. Microscopic Comment The carcinoma appears grade 2. Prognostic markers will be ordered. Dr. Lyndon Code has reviewed the case. The case was called to [facility] on 06/12/2017. Results: HER2 - **POSITIVE** RATIO OF HER2/CEP17 SIGNALS 2.55 AVERAGE HER2 COPY NUMBER PER CELL 4.85    PROCEDURES  ECHO expected 08/12/17  RADIOGRAPHIC STUDIES: I have personally reviewed the radiological images as listed and agreed with the findings in the report. Nm Pet Image Initial (pi) Skull Base To Thigh  Result Date: 02/05/2018 CLINICAL  DATA:  Subsequent treatment strategy for right breast cancer. EXAM: NUCLEAR MEDICINE PET SKULL BASE TO THIGH TECHNIQUE: 8.24 mCi F-18 FDG was injected intravenously. Full-ring PET imaging was performed from the skull base to thigh after the radiotracer. CT data was obtained and used for attenuation correction and anatomic localization. Fasting blood glucose: 102 mg/dl Mediastinal blood pool activity: SUV max 2.7 COMPARISON:  Multiple exams, including CT exams from 12/30/2017 and 12/29/2017 FINDINGS: NECK: Low-grade symmetric palatine tonsillar activity bilaterally, likely physiologic. Symmetric glottic activity, likely physiologic. Suboccipital muscular activity, again thought to be physiologic. There is activity in the adipose tissues of the lower neck without CT correlate, compatible with hypermetabolic brown fat. No hypermetabolic or pathologic adenopathy identified. Incidental CT findings: Unremarkable CHEST: Right middle lobe nodularity and minimal residual bandlike airspace opacity at the site of the prior consolidation, associated with a punctate and a linear calcification as on image 38/8. Maximum SUV 3.2. Back on 07/02/2017 there is some minimal nodularity in this vicinity; on the CT scans from February 2019 there was surrounding consolidation which today has improved and shows mainly nodularity and some bandlike atelectasis. Calcified right hilar lymph nodes are present, with maximum SUV similar to blood pool. Incidental CT findings: Right Port-A-Cath tip: Lower SVC. Right axillary clips noted. Low-grade activity along right mastectomy site. Small areas of paraspinal and intercostal activity correspond to adipose density  and favor hypermetabolic brown fat. ABDOMEN/PELVIS: Mild likely physiologic activity in the duodenal bulb. No appreciable significant abnormal hypermetabolic activity in the abdomen/pelvis. Incidental CT findings: none SKELETON: No significant abnormal hypermetabolic skeletal activity.  Incidental CT findings: none IMPRESSION: 1. The previous consolidation in the right middle lobe currently has a more nodular and reticular appearance with some associated mild atelectasis along its anterior margin. This demonstrates low-grade metabolic activity with maximum SUV 3.2 (background blood pool activity is 2.7). Although conceivably related to low-grade residuum from the patient's malignancy, the presence of calcified right hilar lymph nodes and small calcifications along the nodularity raise suspicion for a low-grade active granulomatous process or atypical infectious process. Surveillance is recommended. 2. Scattered deposits of hypermetabolic but benign metabolically active brown fat. Electronically Signed   By: Van Clines M.D.   On: 02/05/2018 08:22     PET Scan 02/04/18  IMPRESSION: 1. The previous consolidation in the right middle lobe currently has a more nodular and reticular appearance with some associated mild atelectasis along its anterior margin. This demonstrates low-grade metabolic activity with maximum SUV 3.2 (background blood pool activity is 2.7). Although conceivably related to low-grade residuum from the patient's malignancy, the presence of calcified right hilar lymph nodes and small calcifications along the nodularity raise suspicion for a low-grade active granulomatous process or atypical infectious process. Surveillance is recommended. 2. Scattered deposits of hypermetabolic but benign metabolically active brown fat.  CT AP W Contrast 12/29/17 IMPRESSION: No definite acute intra-abdominal or intrapelvic abnormalities. 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  CT Chest W Contrast 12/28/17 IMPRESSION: 1. 2.3 x 2.7 x 1.2 cm irregular masslike opacity within the right middle lobe worrisome for malignancy/metastasis. 2. 3 new very small sclerotic lesions, within the sternum and the body of T4, worrisome for new  metastases.  CT AP W Contrast 09/05/17 IMPRESSION: Stable 7 mm hyperdensity seen in right hepatic lobe most consistent with hemangioma based on recent MRI. 2.4 cm right ovarian involuting cyst is noted. No other abnormality seen in the abdomen or pelvis.  MRI Abdomen at [facility] 08/25/17  IMPRESSION: 1. Lesion in segment 7 of the liver has signal and enhancement characteristics compatible with a flash filling hemangioma. No specific findings identified to suggest metastatic disease.  ASSESSMENT & PLAN:  Lalita Ebel is a 42 y.o. premenopausal female with a history of Vitamin D deficiency and high cholesterol and kidney stones, presented with a palpable right breast mass.   1. Malignant neoplasm of upper-outer quadrant of right breast in female, mixed invasive ductal and lobular carcinoma, pT3(m)N2aM0, stage 1b, ductal carcinoma triple positive, lobular carcinoma ER+/PR+/HER2-,  Grade 2 -She underwent staging CT and bone scan on 07/02/2017 prior to surgery, both were negative for metastasis. -There was a 72m enhancing or hyperdense focus posteriorly in the liver which is technically nonspecific, and her abdominal MRI showed this lesion is likely hemangioma. -I previously reviewed the surgical pathology with the patient and her husband in detail.  -Final diagnosis from the right simple mastectomy and sentinel lymph node biopsy on 07/22/2017 revealed mixed invasive lobular and ductal carcinoma, grade 2, spanning 5.3 cm. There is an additional focus of lobular carcinoma, grade 2, spanning 2.2cm and it is ER+/PR+/HER2-. 4 of 10 lymph nodes tested positive for macrometastases and they are HER2+ ductal carcinoma.  -the HER2 testing criteria has been updated by [employer] in 2018, I requested additional HER2 by IHC on her positive node, result is still pending, and  the percentage of ductal vs lobular components in the primary tumor  -I previously discussed the possibility of oral anti-HER2 therapy after she  completes Herceptin maintenance therapy. -We reviewed the risk of cancer recurrence after complete surgical resection. Giving the large size of tumor, multiple positive lymph nodes, HER-2 positive disease, she has very high risk for recurrence. She completed adjuvant chemo with TCHP.   -She initially preferred reconstruction. She saw Dr. Iran Planas but ultimately did not opt for reconstruction.  -Given her ER/PR positive tumor, I recommend adjuvant antiestrogen therapy with tamoxifen, starting after her adjuvant radiation. Benefits and the side effects were previously reviewed with her, she is interested. Given her young age, ovarian suppression and an AI would be likely improve her cancer survival, she has agreed with ovarian suppression and AI --Pt was admitted to the hospital on 12/28/17 with complaints of flulike symptoms and worked up for sepsis due to influenza. CT Chest from 2/12 showed multifocal/patchy opacities in RUL, RML, and bilateral lower lobes, that was suspicious for multifocal pneumonia. I saw the pt and discussed the other findings of the CT with her and her husband. It also revealed a new 2.7 cm irregular mass in the right middle lobe lung, and small tiny bone lesions in the sternum and T4 that are suspicious for metastatic disease. she was treated for influenza and superimposed pneumonia.  She has had a slow recovery from the hospitalization. -PET Scan from 02/04/18 revealed a small amount of uptake in the right lung that is about 80% resolved from her previous CT. I discussed her results and reviewed the scan and previous scans with pt and her husband in person. I do not suspect that this is metastasis and rather inflammation from pneumonia. Will continue to monitor and I will get a restaging scan in 2-3 months.  -Given her young age and locally advanced disease, I recommended ovarian suppression and AI. She is agreeable and will start Zoladex injection when her hot flashes become more  manageable with Effexor. We also previously discussed the option of BSO, she is willing to consider in the future.  Plan for her to start her aromatase inhibitor towards the end of radiation  -HER2 FISH was initially positive, with ratio of 2.01, and a copy number of 3.63 on surgical sample. Based on the new HER-2 testing criteria, it would require a IHC test to confirm, IHC was negative (1+), the IHC was mainly done on the lobular component. -Plan for her to continue Herceptin and perjeta today, complete HER2 results are not back. Will discuss treatment options again next visit -She started adjuvant RT with Dr. Lisbeth Renshaw on 02/23/18, plan to complete in 5-6 weeks -Patient agrees with ovarian suppression, will start Zoladex injection today, plan to start aromatase inhibitor after she completes radiation. -Plan to repeat CT Chest in May 2019  -F/u in 3 weeks   2. Genetics -Due to her family history of breast cancer, she agreed to a genetics referral -She was negative for all mutations tested   3. Insomnia  -She has history of insomnia failed multiple agents such as lunesta, trazodone, tylenol pm, and xanax. -She was prescribed Remeron for her to try on 10/10/17. I previously explained this may also help with her anxiety related to treatment and side effects. -Remeron has helped mildly. She would like to increase dose to 49m. I called in on 10/31/17 -She reports that Ativan has alleviated her symptoms thus far and she voiced needing a refill. Refill provided previously (01/16/18) -  We discussed potentially weaning her off Ativan for sleep, she has mild anxiety due to treatment. I will refill 15 tablets for her today and I prescribed Ambien. She can use 0.5 tablet of Ativan or every other day alternating with Ambien. Also discussed sleep hygiene   4. Hot flash -Her hot flashes are moderate-severe, multiple times a day. She states she was previously on Effexor 12m for depression and had to discontinue due  to to the Remeron. She is off Remeron right not.  -Restart 766mEffexor, prescribed 02/06/18 -much improved on Effexor    5. Bone Health  -I briefly discussed today how and aromatase inhibitor can weaken the bone -We will consider getting a DEXA in the future -She will start a Vitamin D supplement    PLAN:  -Continue with herceptin perjeta today -additional HER2 test results are still pending, I spoke with pathologist Dr. MaTresa Mooreoday  -Start Zoladex injection today and continue monthly  -Filled Ativan 15 tablets and prescribed Ambien today, no more ativan refill   -Lab and f/u in 3 weeks to discuss HER2 therapy  -Plan to repeat CT Chest in May 2019    All questions were answered. The patient knows to call the clinic with any problems, questions or concerns.  I spent 20 minutes counseling the patient face to face. The total time spent in the appointment was 30 minutes and more than 50% was on counseling.  This document serves as a record of services personally performed by YaTruitt MerleMD. It was created on her behalf by DaTheresia Bougha trained medical scribe. The creation of this record is based on the scribe's personal observations and the provider's statements to them.   I have reviewed the above documentation for accuracy and completeness, and I agree with the above.   YaTruitt Merle4/10/2018 2:44 PM

## 2018-02-27 ENCOUNTER — Inpatient Hospital Stay: Payer: BLUE CROSS/BLUE SHIELD

## 2018-02-27 ENCOUNTER — Inpatient Hospital Stay: Payer: BLUE CROSS/BLUE SHIELD | Attending: Hematology

## 2018-02-27 ENCOUNTER — Encounter: Payer: Self-pay | Admitting: General Practice

## 2018-02-27 ENCOUNTER — Encounter: Payer: Self-pay | Admitting: Hematology

## 2018-02-27 ENCOUNTER — Inpatient Hospital Stay (HOSPITAL_BASED_OUTPATIENT_CLINIC_OR_DEPARTMENT_OTHER): Payer: BLUE CROSS/BLUE SHIELD | Admitting: Hematology

## 2018-02-27 ENCOUNTER — Ambulatory Visit
Admission: RE | Admit: 2018-02-27 | Discharge: 2018-02-27 | Disposition: A | Discharge status: Home / Self Care | Payer: BLUE CROSS/BLUE SHIELD | Source: Ambulatory Visit | Attending: Radiation Oncology | Admitting: Radiation Oncology

## 2018-02-27 VITALS — BP 100/70 | HR 121 | Temp 98.5°F | Resp 17 | Ht 66.0 in | Wt 155.4 lb

## 2018-02-27 DIAGNOSIS — R110 Nausea: Secondary | ICD-10-CM

## 2018-02-27 DIAGNOSIS — E559 Vitamin D deficiency, unspecified: Secondary | ICD-10-CM | POA: Insufficient documentation

## 2018-02-27 DIAGNOSIS — C773 Secondary and unspecified malignant neoplasm of axilla and upper limb lymph nodes: Secondary | ICD-10-CM

## 2018-02-27 DIAGNOSIS — Z5112 Encounter for antineoplastic immunotherapy: Secondary | ICD-10-CM | POA: Insufficient documentation

## 2018-02-27 DIAGNOSIS — Z510 Encounter for antineoplastic radiation therapy: Secondary | ICD-10-CM | POA: Diagnosis not present

## 2018-02-27 DIAGNOSIS — Z170 Estrogen receptor positive status [ER+]: Secondary | ICD-10-CM

## 2018-02-27 DIAGNOSIS — K219 Gastro-esophageal reflux disease without esophagitis: Secondary | ICD-10-CM | POA: Insufficient documentation

## 2018-02-27 DIAGNOSIS — C50411 Malignant neoplasm of upper-outer quadrant of right female breast: Secondary | ICD-10-CM | POA: Diagnosis not present

## 2018-02-27 DIAGNOSIS — F418 Other specified anxiety disorders: Secondary | ICD-10-CM | POA: Insufficient documentation

## 2018-02-27 DIAGNOSIS — M5137 Other intervertebral disc degeneration, lumbosacral region: Secondary | ICD-10-CM | POA: Insufficient documentation

## 2018-02-27 DIAGNOSIS — R232 Flushing: Secondary | ICD-10-CM | POA: Diagnosis not present

## 2018-02-27 DIAGNOSIS — G4700 Insomnia, unspecified: Secondary | ICD-10-CM | POA: Insufficient documentation

## 2018-02-27 DIAGNOSIS — Z803 Family history of malignant neoplasm of breast: Secondary | ICD-10-CM | POA: Insufficient documentation

## 2018-02-27 DIAGNOSIS — Z800 Family history of malignant neoplasm of digestive organs: Secondary | ICD-10-CM | POA: Insufficient documentation

## 2018-02-27 DIAGNOSIS — E7800 Pure hypercholesterolemia, unspecified: Secondary | ICD-10-CM | POA: Diagnosis not present

## 2018-02-27 DIAGNOSIS — Z806 Family history of leukemia: Secondary | ICD-10-CM | POA: Diagnosis not present

## 2018-02-27 DIAGNOSIS — Z79899 Other long term (current) drug therapy: Secondary | ICD-10-CM | POA: Diagnosis not present

## 2018-02-27 DIAGNOSIS — Z8042 Family history of malignant neoplasm of prostate: Secondary | ICD-10-CM | POA: Insufficient documentation

## 2018-02-27 DIAGNOSIS — R12 Heartburn: Secondary | ICD-10-CM

## 2018-02-27 DIAGNOSIS — Z801 Family history of malignant neoplasm of trachea, bronchus and lung: Secondary | ICD-10-CM | POA: Diagnosis not present

## 2018-02-27 DIAGNOSIS — Z9013 Acquired absence of bilateral breasts and nipples: Secondary | ICD-10-CM | POA: Insufficient documentation

## 2018-02-27 LAB — COMPREHENSIVE METABOLIC PANEL
ALBUMIN: 3.8 g/dL (ref 3.5–5.0)
ALT: 14 U/L (ref 0–55)
ANION GAP: 7 (ref 3–11)
AST: 13 U/L (ref 5–34)
Alkaline Phosphatase: 74 U/L (ref 40–150)
BUN: 12 mg/dL (ref 7–26)
CO2: 26 mmol/L (ref 22–29)
Calcium: 9.8 mg/dL (ref 8.4–10.4)
Chloride: 108 mmol/L (ref 98–109)
Creatinine, Ser: 0.94 mg/dL (ref 0.60–1.10)
GFR calc Af Amer: >60 mL/min (ref >60)
GFR calc non Af Amer: >60 mL/min (ref >60)
GLUCOSE: 130 mg/dL (ref 70–140)
POTASSIUM: 3.7 mmol/L (ref 3.5–5.1)
SODIUM: 141 mmol/L (ref 136–145)
Total Bilirubin: 0.7 mg/dL (ref 0.2–1.2)
Total Protein: 7 g/dL (ref 6.4–8.3)

## 2018-02-27 LAB — CBC WITH DIFFERENTIAL/PLATELET
BASOS ABS: 0 10*3/uL (ref 0.0–0.1)
Basophils Relative: 1 %
EOS PCT: 2 %
Eosinophils Absolute: 0.1 10*3/uL (ref 0.0–0.5)
HEMATOCRIT: 36.7 % (ref 34.8–46.6)
Hemoglobin: 12.3 g/dL (ref 11.6–15.9)
LYMPHS ABS: 0.9 10*3/uL (ref 0.9–3.3)
LYMPHS PCT: 25 %
MCH: 31.2 pg (ref 25.1–34.0)
MCHC: 33.5 g/dL (ref 31.5–36.0)
MCV: 93.1 fL (ref 79.5–101.0)
MONO ABS: 0.3 10*3/uL (ref 0.1–0.9)
MONOS PCT: 8 %
NEUTROS ABS: 2.4 10*3/uL (ref 1.5–6.5)
Neutrophils Relative %: 64 %
Platelets: 145 10*3/uL (ref 145–400)
RBC: 3.94 MIL/uL (ref 3.70–5.45)
RDW: 14.2 % (ref 11.2–14.5)
WBC: 3.8 10*3/uL — ABNORMAL LOW (ref 3.9–10.3)

## 2018-02-27 MED ORDER — DIPHENHYDRAMINE HCL 25 MG PO CAPS
50.0000 mg | ORAL_CAPSULE | Freq: Once | ORAL | Status: AC
  Administered 2018-02-27: 50 mg via ORAL

## 2018-02-27 MED ORDER — SODIUM CHLORIDE 0.9 % IV SOLN
420.0000 mg | Freq: Once | INTRAVENOUS | Status: AC
  Administered 2018-02-27: 420 mg via INTRAVENOUS
  Filled 2018-02-27: qty 14

## 2018-02-27 MED ORDER — RADIAPLEXRX EX GEL
Freq: Once | CUTANEOUS | Status: AC
  Administered 2018-02-27: 14:00:00 via TOPICAL

## 2018-02-27 MED ORDER — HEPARIN SOD (PORK) LOCK FLUSH 100 UNIT/ML IV SOLN
500.0000 [IU] | Freq: Once | INTRAVENOUS | Status: AC | PRN
  Administered 2018-02-27: 500 [IU]
  Filled 2018-02-27: qty 5

## 2018-02-27 MED ORDER — DIPHENHYDRAMINE HCL 25 MG PO CAPS
ORAL_CAPSULE | ORAL | Status: AC
  Filled 2018-02-27: qty 2

## 2018-02-27 MED ORDER — ACETAMINOPHEN 325 MG PO TABS
ORAL_TABLET | ORAL | Status: AC
  Filled 2018-02-27: qty 2

## 2018-02-27 MED ORDER — SODIUM CHLORIDE 0.9% FLUSH
10.0000 mL | INTRAVENOUS | Status: DC | PRN
  Administered 2018-02-27: 10 mL
  Filled 2018-02-27: qty 10

## 2018-02-27 MED ORDER — SODIUM CHLORIDE 0.9 % IV SOLN
Freq: Once | INTRAVENOUS | Status: AC
  Administered 2018-02-27: 14:00:00 via INTRAVENOUS

## 2018-02-27 MED ORDER — ACETAMINOPHEN 325 MG PO TABS
650.0000 mg | ORAL_TABLET | Freq: Once | ORAL | Status: AC
  Administered 2018-02-27: 650 mg via ORAL

## 2018-02-27 MED ORDER — ZOLPIDEM TARTRATE 5 MG PO TABS: 5.0000 mg | ORAL_TABLET | Freq: Every evening | ORAL | 0 refills | Status: DC | PRN

## 2018-02-27 MED ORDER — ALRA NON-METALLIC DEODORANT (RAD-ONC)
1.0000 "application " | Freq: Once | TOPICAL | Status: AC
  Administered 2018-02-27: 1 via TOPICAL

## 2018-02-27 MED ORDER — TRASTUZUMAB CHEMO 150 MG IV SOLR
450.0000 mg | Freq: Once | INTRAVENOUS | Status: AC
  Administered 2018-02-27: 450 mg via INTRAVENOUS
  Filled 2018-02-27: qty 21.43

## 2018-02-27 MED ORDER — LORAZEPAM 0.5 MG PO TABS: 0.5000 mg | ORAL_TABLET | Freq: Three times a day (TID) | ORAL | 0 refills | Status: DC | PRN

## 2018-02-27 NOTE — Patient Instructions (Signed)

## 2018-02-27 NOTE — Patient Instructions (Signed)
[facility] Discharge Instructions for Patients Receiving Chemotherapy  Today you received the following chemotherapy agents :  Herceptin, Perjeta.  To help prevent nausea and vomiting after your treatment, we encourage you to take your nausea medication as prescribed.   If you develop nausea and vomiting that is not controlled by your nausea medication, call the clinic.   BELOW ARE SYMPTOMS THAT SHOULD BE REPORTED IMMEDIATELY:  *FEVER GREATER THAN 100.5 F  *CHILLS WITH OR WITHOUT FEVER  NAUSEA AND VOMITING THAT IS NOT CONTROLLED WITH YOUR NAUSEA MEDICATION  *UNUSUAL SHORTNESS OF BREATH  *UNUSUAL BRUISING OR BLEEDING  TENDERNESS IN MOUTH AND THROAT WITH OR WITHOUT PRESENCE OF ULCERS  *URINARY PROBLEMS  *BOWEL PROBLEMS  UNUSUAL RASH Items with * indicate a potential emergency and should be followed up as soon as possible.  Feel free to call the clinic should you have any questions or concerns. The clinic phone number is [phone].  Please show the CHEMO ALERT CARD at check-in to the Emergency Department and triage nurse.   

## 2018-02-27 NOTE — Progress Notes (Signed)
[place] Spiritual Care Note  Finally met Sheila [name] and her husband in infusion, providing intro to Spiritual Care as part of their support team. They used opportunity well to share and process how they use perspective and humor to cope. They report good support from family, and [name] verbalized appreciation for Breast Cancer and Young Women's Cancer Support Groups. Per pt, she also has an Network engineer.   Per couple, no other needs at this time, but they are aware of ongoing chaplain availability. Please also page if immediate needs arise or circumstances change. Thank you!   [place], [place], [facility] Pager [phone] Voicemail [phone]

## 2018-02-27 NOTE — Progress Notes (Signed)
Pt here for patient teaching.  Pt given Radiation and You booklet, skin care instructions, Alra deodorant and Radiaplex gel.  Reviewed areas of pertinence such as fatigue, hair loss, skin changes, breast tenderness and breast swelling . Pt able to give teach back of to pat skin and use unscented/gentle soap,apply Radiaplex bid, avoid applying anything to skin within 4 hours of treatment and to use an electric razor if they must shave. Pt verbalizes understanding of information given and will contact nursing with any questions or concerns.    Cori Razor, RN

## 2018-02-28 ENCOUNTER — Encounter: Payer: Self-pay | Admitting: Hematology

## 2018-02-28 LAB — CANCER ANTIGEN 27.29: CA 27.29: 11.9 U/mL (ref 0.0–38.6)

## 2018-03-02 ENCOUNTER — Ambulatory Visit
Admission: RE | Admit: 2018-03-02 | Discharge: 2018-03-02 | Disposition: A | Discharge status: Home / Self Care | Payer: BLUE CROSS/BLUE SHIELD | Source: Ambulatory Visit | Attending: Radiation Oncology | Admitting: Radiation Oncology

## 2018-03-02 ENCOUNTER — Telehealth: Payer: Self-pay | Admitting: Hematology

## 2018-03-02 DIAGNOSIS — Z170 Estrogen receptor positive status [ER+]: Secondary | ICD-10-CM | POA: Diagnosis not present

## 2018-03-02 DIAGNOSIS — C50411 Malignant neoplasm of upper-outer quadrant of right female breast: Secondary | ICD-10-CM | POA: Diagnosis not present

## 2018-03-02 DIAGNOSIS — Z510 Encounter for antineoplastic radiation therapy: Secondary | ICD-10-CM | POA: Diagnosis not present

## 2018-03-02 NOTE — Telephone Encounter (Signed)
Called patient with appointments per 4/12 los

## 2018-03-03 ENCOUNTER — Ambulatory Visit
Admission: RE | Admit: 2018-03-03 | Discharge: 2018-03-03 | Disposition: A | Discharge status: Home / Self Care | Payer: BLUE CROSS/BLUE SHIELD | Source: Ambulatory Visit | Attending: Radiation Oncology | Admitting: Radiation Oncology

## 2018-03-03 DIAGNOSIS — C50411 Malignant neoplasm of upper-outer quadrant of right female breast: Secondary | ICD-10-CM | POA: Diagnosis not present

## 2018-03-03 DIAGNOSIS — Z170 Estrogen receptor positive status [ER+]: Secondary | ICD-10-CM | POA: Diagnosis not present

## 2018-03-03 DIAGNOSIS — Z510 Encounter for antineoplastic radiation therapy: Secondary | ICD-10-CM | POA: Diagnosis not present

## 2018-03-04 ENCOUNTER — Ambulatory Visit
Admission: RE | Admit: 2018-03-04 | Discharge: 2018-03-04 | Disposition: A | Discharge status: Home / Self Care | Payer: BLUE CROSS/BLUE SHIELD | Source: Ambulatory Visit | Attending: Radiation Oncology | Admitting: Radiation Oncology

## 2018-03-04 DIAGNOSIS — Z170 Estrogen receptor positive status [ER+]: Secondary | ICD-10-CM | POA: Diagnosis not present

## 2018-03-04 DIAGNOSIS — C50411 Malignant neoplasm of upper-outer quadrant of right female breast: Secondary | ICD-10-CM | POA: Diagnosis not present

## 2018-03-04 DIAGNOSIS — Z510 Encounter for antineoplastic radiation therapy: Secondary | ICD-10-CM | POA: Diagnosis not present

## 2018-03-05 ENCOUNTER — Ambulatory Visit
Admission: RE | Admit: 2018-03-05 | Discharge: 2018-03-05 | Disposition: A | Discharge status: Home / Self Care | Payer: BLUE CROSS/BLUE SHIELD | Source: Ambulatory Visit | Attending: Radiation Oncology | Admitting: Radiation Oncology

## 2018-03-05 DIAGNOSIS — C50411 Malignant neoplasm of upper-outer quadrant of right female breast: Secondary | ICD-10-CM | POA: Diagnosis not present

## 2018-03-05 DIAGNOSIS — Z510 Encounter for antineoplastic radiation therapy: Secondary | ICD-10-CM | POA: Diagnosis not present

## 2018-03-05 DIAGNOSIS — Z170 Estrogen receptor positive status [ER+]: Secondary | ICD-10-CM | POA: Diagnosis not present

## 2018-03-06 ENCOUNTER — Ambulatory Visit
Admission: RE | Admit: 2018-03-06 | Discharge: 2018-03-06 | Disposition: A | Discharge status: Home / Self Care | Payer: BLUE CROSS/BLUE SHIELD | Source: Ambulatory Visit | Attending: Radiation Oncology | Admitting: Radiation Oncology

## 2018-03-06 DIAGNOSIS — C50411 Malignant neoplasm of upper-outer quadrant of right female breast: Secondary | ICD-10-CM | POA: Diagnosis not present

## 2018-03-06 DIAGNOSIS — Z510 Encounter for antineoplastic radiation therapy: Secondary | ICD-10-CM | POA: Diagnosis not present

## 2018-03-06 DIAGNOSIS — Z170 Estrogen receptor positive status [ER+]: Secondary | ICD-10-CM | POA: Diagnosis not present

## 2018-03-09 ENCOUNTER — Ambulatory Visit
Admission: RE | Admit: 2018-03-09 | Discharge: 2018-03-09 | Disposition: A | Discharge status: Home / Self Care | Payer: BLUE CROSS/BLUE SHIELD | Source: Ambulatory Visit | Attending: Radiation Oncology | Admitting: Radiation Oncology

## 2018-03-09 DIAGNOSIS — Z170 Estrogen receptor positive status [ER+]: Secondary | ICD-10-CM | POA: Diagnosis not present

## 2018-03-09 DIAGNOSIS — C50411 Malignant neoplasm of upper-outer quadrant of right female breast: Secondary | ICD-10-CM | POA: Diagnosis not present

## 2018-03-09 DIAGNOSIS — Z510 Encounter for antineoplastic radiation therapy: Secondary | ICD-10-CM | POA: Diagnosis not present

## 2018-03-10 ENCOUNTER — Ambulatory Visit
Admission: RE | Admit: 2018-03-10 | Discharge: 2018-03-10 | Disposition: A | Discharge status: Home / Self Care | Payer: BLUE CROSS/BLUE SHIELD | Source: Ambulatory Visit | Attending: Radiation Oncology | Admitting: Radiation Oncology

## 2018-03-10 DIAGNOSIS — Z170 Estrogen receptor positive status [ER+]: Secondary | ICD-10-CM | POA: Diagnosis not present

## 2018-03-10 DIAGNOSIS — Z510 Encounter for antineoplastic radiation therapy: Secondary | ICD-10-CM | POA: Diagnosis not present

## 2018-03-10 DIAGNOSIS — C50411 Malignant neoplasm of upper-outer quadrant of right female breast: Secondary | ICD-10-CM | POA: Diagnosis not present

## 2018-03-11 ENCOUNTER — Ambulatory Visit
Admission: RE | Admit: 2018-03-11 | Discharge: 2018-03-11 | Disposition: A | Discharge status: Home / Self Care | Payer: BLUE CROSS/BLUE SHIELD | Source: Ambulatory Visit | Attending: Radiation Oncology | Admitting: Radiation Oncology

## 2018-03-11 DIAGNOSIS — C50411 Malignant neoplasm of upper-outer quadrant of right female breast: Secondary | ICD-10-CM | POA: Diagnosis not present

## 2018-03-11 DIAGNOSIS — Z170 Estrogen receptor positive status [ER+]: Secondary | ICD-10-CM | POA: Diagnosis not present

## 2018-03-11 DIAGNOSIS — Z510 Encounter for antineoplastic radiation therapy: Secondary | ICD-10-CM | POA: Diagnosis not present

## 2018-03-12 ENCOUNTER — Ambulatory Visit
Admission: RE | Admit: 2018-03-12 | Discharge: 2018-03-12 | Disposition: A | Discharge status: Home / Self Care | Payer: BLUE CROSS/BLUE SHIELD | Source: Ambulatory Visit | Attending: Radiation Oncology | Admitting: Radiation Oncology

## 2018-03-12 DIAGNOSIS — C50411 Malignant neoplasm of upper-outer quadrant of right female breast: Secondary | ICD-10-CM | POA: Diagnosis not present

## 2018-03-12 DIAGNOSIS — Z170 Estrogen receptor positive status [ER+]: Secondary | ICD-10-CM | POA: Diagnosis not present

## 2018-03-12 DIAGNOSIS — Z510 Encounter for antineoplastic radiation therapy: Secondary | ICD-10-CM | POA: Diagnosis not present

## 2018-03-13 ENCOUNTER — Ambulatory Visit: Payer: BLUE CROSS/BLUE SHIELD

## 2018-03-16 ENCOUNTER — Ambulatory Visit
Admission: RE | Admit: 2018-03-16 | Discharge: 2018-03-16 | Disposition: A | Discharge status: Home / Self Care | Payer: BLUE CROSS/BLUE SHIELD | Source: Ambulatory Visit | Attending: Radiation Oncology | Admitting: Radiation Oncology

## 2018-03-16 DIAGNOSIS — Z170 Estrogen receptor positive status [ER+]: Secondary | ICD-10-CM | POA: Diagnosis not present

## 2018-03-16 DIAGNOSIS — C50411 Malignant neoplasm of upper-outer quadrant of right female breast: Secondary | ICD-10-CM | POA: Diagnosis not present

## 2018-03-16 DIAGNOSIS — Z510 Encounter for antineoplastic radiation therapy: Secondary | ICD-10-CM | POA: Diagnosis not present

## 2018-03-17 ENCOUNTER — Ambulatory Visit
Admission: RE | Admit: 2018-03-17 | Discharge: 2018-03-17 | Disposition: A | Discharge status: Home / Self Care | Payer: BLUE CROSS/BLUE SHIELD | Source: Ambulatory Visit | Attending: Radiation Oncology | Admitting: Radiation Oncology

## 2018-03-17 DIAGNOSIS — C50411 Malignant neoplasm of upper-outer quadrant of right female breast: Secondary | ICD-10-CM | POA: Diagnosis not present

## 2018-03-17 DIAGNOSIS — Z170 Estrogen receptor positive status [ER+]: Secondary | ICD-10-CM | POA: Diagnosis not present

## 2018-03-17 DIAGNOSIS — Z510 Encounter for antineoplastic radiation therapy: Secondary | ICD-10-CM | POA: Diagnosis not present

## 2018-03-18 ENCOUNTER — Ambulatory Visit
Admission: RE | Admit: 2018-03-18 | Discharge: 2018-03-18 | Disposition: A | Discharge status: Home / Self Care | Payer: BLUE CROSS/BLUE SHIELD | Source: Ambulatory Visit | Attending: Radiation Oncology | Admitting: Radiation Oncology

## 2018-03-18 DIAGNOSIS — C50411 Malignant neoplasm of upper-outer quadrant of right female breast: Secondary | ICD-10-CM | POA: Insufficient documentation

## 2018-03-18 DIAGNOSIS — Z510 Encounter for antineoplastic radiation therapy: Secondary | ICD-10-CM | POA: Diagnosis not present

## 2018-03-18 DIAGNOSIS — Z170 Estrogen receptor positive status [ER+]: Secondary | ICD-10-CM | POA: Diagnosis not present

## 2018-03-19 ENCOUNTER — Ambulatory Visit
Admission: RE | Admit: 2018-03-19 | Discharge: 2018-03-19 | Disposition: A | Discharge status: Home / Self Care | Payer: BLUE CROSS/BLUE SHIELD | Source: Ambulatory Visit | Attending: Radiation Oncology | Admitting: Radiation Oncology

## 2018-03-19 DIAGNOSIS — Z170 Estrogen receptor positive status [ER+]: Secondary | ICD-10-CM | POA: Diagnosis not present

## 2018-03-19 DIAGNOSIS — C50411 Malignant neoplasm of upper-outer quadrant of right female breast: Secondary | ICD-10-CM | POA: Diagnosis not present

## 2018-03-19 DIAGNOSIS — Z510 Encounter for antineoplastic radiation therapy: Secondary | ICD-10-CM | POA: Diagnosis not present

## 2018-03-20 ENCOUNTER — Telehealth: Payer: Self-pay | Admitting: Hematology

## 2018-03-20 ENCOUNTER — Inpatient Hospital Stay: Payer: BLUE CROSS/BLUE SHIELD

## 2018-03-20 ENCOUNTER — Ambulatory Visit
Admission: RE | Admit: 2018-03-20 | Discharge: 2018-03-20 | Disposition: A | Discharge status: Home / Self Care | Payer: BLUE CROSS/BLUE SHIELD | Source: Ambulatory Visit | Attending: Radiation Oncology | Admitting: Radiation Oncology

## 2018-03-20 ENCOUNTER — Inpatient Hospital Stay: Payer: BLUE CROSS/BLUE SHIELD | Attending: Nurse Practitioner

## 2018-03-20 ENCOUNTER — Inpatient Hospital Stay (HOSPITAL_BASED_OUTPATIENT_CLINIC_OR_DEPARTMENT_OTHER): Payer: BLUE CROSS/BLUE SHIELD | Admitting: Hematology

## 2018-03-20 ENCOUNTER — Encounter: Payer: Self-pay | Admitting: Hematology

## 2018-03-20 VITALS — BP 96/59 | HR 89 | Temp 98.3°F | Resp 18 | Ht 66.0 in | Wt 153.8 lb

## 2018-03-20 DIAGNOSIS — Z5112 Encounter for antineoplastic immunotherapy: Secondary | ICD-10-CM | POA: Insufficient documentation

## 2018-03-20 DIAGNOSIS — C50411 Malignant neoplasm of upper-outer quadrant of right female breast: Secondary | ICD-10-CM | POA: Insufficient documentation

## 2018-03-20 DIAGNOSIS — E559 Vitamin D deficiency, unspecified: Secondary | ICD-10-CM | POA: Diagnosis not present

## 2018-03-20 DIAGNOSIS — M5137 Other intervertebral disc degeneration, lumbosacral region: Secondary | ICD-10-CM | POA: Insufficient documentation

## 2018-03-20 DIAGNOSIS — Z87442 Personal history of urinary calculi: Secondary | ICD-10-CM | POA: Diagnosis not present

## 2018-03-20 DIAGNOSIS — F419 Anxiety disorder, unspecified: Secondary | ICD-10-CM | POA: Insufficient documentation

## 2018-03-20 DIAGNOSIS — R12 Heartburn: Secondary | ICD-10-CM | POA: Insufficient documentation

## 2018-03-20 DIAGNOSIS — G4700 Insomnia, unspecified: Secondary | ICD-10-CM

## 2018-03-20 DIAGNOSIS — Z8042 Family history of malignant neoplasm of prostate: Secondary | ICD-10-CM | POA: Insufficient documentation

## 2018-03-20 DIAGNOSIS — R232 Flushing: Secondary | ICD-10-CM | POA: Diagnosis not present

## 2018-03-20 DIAGNOSIS — Z95828 Presence of other vascular implants and grafts: Secondary | ICD-10-CM

## 2018-03-20 DIAGNOSIS — Z923 Personal history of irradiation: Secondary | ICD-10-CM

## 2018-03-20 DIAGNOSIS — Z79899 Other long term (current) drug therapy: Secondary | ICD-10-CM

## 2018-03-20 DIAGNOSIS — Z170 Estrogen receptor positive status [ER+]: Secondary | ICD-10-CM | POA: Diagnosis not present

## 2018-03-20 DIAGNOSIS — Z9013 Acquired absence of bilateral breasts and nipples: Secondary | ICD-10-CM | POA: Insufficient documentation

## 2018-03-20 DIAGNOSIS — Z803 Family history of malignant neoplasm of breast: Secondary | ICD-10-CM | POA: Insufficient documentation

## 2018-03-20 DIAGNOSIS — Z510 Encounter for antineoplastic radiation therapy: Secondary | ICD-10-CM | POA: Diagnosis not present

## 2018-03-20 DIAGNOSIS — Z793 Long term (current) use of hormonal contraceptives: Secondary | ICD-10-CM | POA: Insufficient documentation

## 2018-03-20 DIAGNOSIS — E7800 Pure hypercholesterolemia, unspecified: Secondary | ICD-10-CM | POA: Insufficient documentation

## 2018-03-20 DIAGNOSIS — M5127 Other intervertebral disc displacement, lumbosacral region: Secondary | ICD-10-CM | POA: Diagnosis not present

## 2018-03-20 DIAGNOSIS — R5383 Other fatigue: Secondary | ICD-10-CM | POA: Diagnosis not present

## 2018-03-20 DIAGNOSIS — R911 Solitary pulmonary nodule: Secondary | ICD-10-CM | POA: Diagnosis not present

## 2018-03-20 DIAGNOSIS — Z806 Family history of leukemia: Secondary | ICD-10-CM | POA: Diagnosis not present

## 2018-03-20 DIAGNOSIS — Z801 Family history of malignant neoplasm of trachea, bronchus and lung: Secondary | ICD-10-CM | POA: Diagnosis not present

## 2018-03-20 LAB — COMPREHENSIVE METABOLIC PANEL
ALK PHOS: 78 U/L (ref 40–150)
ALT: 15 U/L (ref 0–55)
AST: 16 U/L (ref 5–34)
Albumin: 3.9 g/dL (ref 3.5–5.0)
Anion gap: 7 (ref 3–11)
BILIRUBIN TOTAL: 0.8 mg/dL (ref 0.2–1.2)
BUN: 15 mg/dL (ref 7–26)
CALCIUM: 9.6 mg/dL (ref 8.4–10.4)
CO2: 23 mmol/L (ref 22–29)
CREATININE: 0.84 mg/dL (ref 0.60–1.10)
Chloride: 108 mmol/L (ref 98–109)
GFR calc Af Amer: >60 mL/min (ref >60)
Glucose, Bld: 93 mg/dL (ref 70–140)
Potassium: 3.8 mmol/L (ref 3.5–5.1)
Sodium: 138 mmol/L (ref 136–145)
TOTAL PROTEIN: 6.9 g/dL (ref 6.4–8.3)

## 2018-03-20 LAB — CBC WITH DIFFERENTIAL/PLATELET
BASOS ABS: 0 10*3/uL (ref 0.0–0.1)
BASOS PCT: 1 %
EOS PCT: 2 %
Eosinophils Absolute: 0.1 10*3/uL (ref 0.0–0.5)
HCT: 35 % (ref 34.8–46.6)
Hemoglobin: 12 g/dL (ref 11.6–15.9)
Lymphocytes Relative: 28 %
Lymphs Abs: 0.6 10*3/uL — ABNORMAL LOW (ref 0.9–3.3)
MCH: 31.7 pg (ref 25.1–34.0)
MCHC: 34.3 g/dL (ref 31.5–36.0)
MCV: 92.6 fL (ref 79.5–101.0)
MONO ABS: 0.3 10*3/uL (ref 0.1–0.9)
Monocytes Relative: 12 %
Neutro Abs: 1.3 10*3/uL — ABNORMAL LOW (ref 1.5–6.5)
Neutrophils Relative %: 57 %
Platelets: 143 10*3/uL — ABNORMAL LOW (ref 145–400)
RBC: 3.78 MIL/uL (ref 3.70–5.45)
RDW: 14 % (ref 11.2–14.5)
WBC: 2.2 10*3/uL — AB (ref 3.9–10.3)

## 2018-03-20 MED ORDER — ACETAMINOPHEN 325 MG PO TABS
ORAL_TABLET | ORAL | Status: AC
  Filled 2018-03-20: qty 2

## 2018-03-20 MED ORDER — TRASTUZUMAB CHEMO 150 MG IV SOLR
450.0000 mg | Freq: Once | INTRAVENOUS | Status: AC
  Administered 2018-03-20: 450 mg via INTRAVENOUS
  Filled 2018-03-20: qty 21.43

## 2018-03-20 MED ORDER — GOSERELIN ACETATE 3.6 MG ~~LOC~~ IMPL
DRUG_IMPLANT | SUBCUTANEOUS | Status: AC
  Filled 2018-03-20: qty 3.6

## 2018-03-20 MED ORDER — SODIUM CHLORIDE 0.9% FLUSH
10.0000 mL | INTRAVENOUS | Status: DC | PRN
  Administered 2018-03-20: 10 mL
  Filled 2018-03-20: qty 10

## 2018-03-20 MED ORDER — ACETAMINOPHEN 325 MG PO TABS
650.0000 mg | ORAL_TABLET | Freq: Once | ORAL | Status: AC
  Administered 2018-03-20: 650 mg via ORAL

## 2018-03-20 MED ORDER — SODIUM CHLORIDE 0.9 % IV SOLN
420.0000 mg | Freq: Once | INTRAVENOUS | Status: AC
  Administered 2018-03-20: 420 mg via INTRAVENOUS
  Filled 2018-03-20: qty 14

## 2018-03-20 MED ORDER — ZOLPIDEM TARTRATE 5 MG PO TABS: 5.0000 mg | ORAL_TABLET | Freq: Every evening | ORAL | 2 refills | Status: DC | PRN

## 2018-03-20 MED ORDER — HEPARIN SOD (PORK) LOCK FLUSH 100 UNIT/ML IV SOLN
500.0000 [IU] | Freq: Once | INTRAVENOUS | Status: AC | PRN
  Administered 2018-03-20: 500 [IU]
  Filled 2018-03-20: qty 5

## 2018-03-20 MED ORDER — GOSERELIN ACETATE 3.6 MG ~~LOC~~ IMPL
3.6000 mg | DRUG_IMPLANT | Freq: Once | SUBCUTANEOUS | Status: AC
  Administered 2018-03-20: 3.6 mg via SUBCUTANEOUS

## 2018-03-20 MED ORDER — DIPHENHYDRAMINE HCL 25 MG PO CAPS
50.0000 mg | ORAL_CAPSULE | Freq: Once | ORAL | Status: AC
  Administered 2018-03-20: 50 mg via ORAL

## 2018-03-20 MED ORDER — DIPHENHYDRAMINE HCL 25 MG PO CAPS
ORAL_CAPSULE | ORAL | Status: AC
  Filled 2018-03-20: qty 1

## 2018-03-20 MED ORDER — SODIUM CHLORIDE 0.9 % IV SOLN
Freq: Once | INTRAVENOUS | Status: AC
  Administered 2018-03-20: 10:00:00 via INTRAVENOUS

## 2018-03-20 NOTE — Patient Instructions (Signed)

## 2018-03-20 NOTE — Patient Instructions (Addendum)
[facility] Discharge Instructions for Patients Receiving Chemotherapy  Today you received the following chemotherapy agents :  Herceptin, Perjeta.  To help prevent nausea and vomiting after your treatment, we encourage you to take your nausea medication as prescribed.   If you develop nausea and vomiting that is not controlled by your nausea medication, call the clinic.   BELOW ARE SYMPTOMS THAT SHOULD BE REPORTED IMMEDIATELY:  *FEVER GREATER THAN 100.5 F  *CHILLS WITH OR WITHOUT FEVER  NAUSEA AND VOMITING THAT IS NOT CONTROLLED WITH YOUR NAUSEA MEDICATION  *UNUSUAL SHORTNESS OF BREATH  *UNUSUAL BRUISING OR BLEEDING  TENDERNESS IN MOUTH AND THROAT WITH OR WITHOUT PRESENCE OF ULCERS  *URINARY PROBLEMS  *BOWEL PROBLEMS  UNUSUAL RASH Items with * indicate a potential emergency and should be followed up as soon as possible.  Feel free to call the clinic should you have any questions or concerns. The clinic phone number is [phone].  Please show the CHEMO ALERT CARD at check-in to the Emergency Department and triage nurse.  Goserelin injection What is this medicine? GOSERELIN (GOE se rel in) is similar to a hormone found in the body. It lowers the amount of sex hormones that the body makes. Men will have lower testosterone levels and women will have lower estrogen levels while taking this medicine. In men, this medicine is used to treat prostate cancer; the injection is either given once per month or once every 12 weeks. A once per month injection (only) is used to treat women with endometriosis, dysfunctional uterine bleeding, or advanced breast cancer. This medicine may be used for other purposes; ask your health care provider or pharmacist if you have questions. COMMON BRAND NAME(S): Zoladex What should I tell my health care provider before I take this medicine? They need to know if you have any of these conditions (some only apply to  women): -diabetes -heart disease or previous heart attack -high blood pressure -high cholesterol -kidney disease -osteoporosis or low bone density -problems passing urine -spinal cord injury -stroke -tobacco smoker -an unusual or allergic reaction to goserelin, hormone therapy, other medicines, foods, dyes, or preservatives -pregnant or trying to get pregnant -breast-feeding How should I use this medicine? This medicine is for injection under the skin. It is given by a health care professional in a hospital or clinic setting. Men receive this injection once every 4 weeks or once every 12 weeks. Women will only receive the once every 4 weeks injection. Talk to your pediatrician regarding the use of this medicine in children. Special care may be needed. Overdosage: If you think you have taken too much of this medicine contact a poison control center or emergency room at once. NOTE: This medicine is only for you. Do not share this medicine with others. What if I miss a dose? It is important not to miss your dose. Call your doctor or health care professional if you are unable to keep an appointment. What may interact with this medicine? -female hormones like estrogen -herbal or dietary supplements like black cohosh, chasteberry, or DHEA -female hormones like testosterone -prasterone This list may not describe all possible interactions. Give your health care provider a list of all the medicines, herbs, non-prescription drugs, or dietary supplements you use. Also tell them if you smoke, drink alcohol, or use illegal drugs. Some items may interact with your medicine. What should I watch for while using this medicine? Visit your doctor or health care professional for regular checks on your progress.   Your symptoms may appear to get worse during the first weeks of this therapy. Tell your doctor or healthcare professional if your symptoms do not start to get better or if they get worse after this  time. Your bones may get weaker if you take this medicine for a long time. If you smoke or frequently drink alcohol you may increase your risk of bone loss. A family history of osteoporosis, chronic use of drugs for seizures (convulsions), or corticosteroids can also increase your risk of bone loss. Talk to your doctor about how to keep your bones strong. This medicine should stop regular monthly menstration in women. Tell your doctor if you continue to [facility]. Women should not become pregnant while taking this medicine or for 12 weeks after stopping this medicine. Women should inform their doctor if they wish to become pregnant or think they might be pregnant. There is a potential for serious side effects to an unborn child. Talk to your health care professional or pharmacist for more information. Do not breast-feed an infant while taking this medicine. Men should inform their doctors if they wish to father a child. This medicine may lower sperm counts. Talk to your health care professional or pharmacist for more information. What side effects may I notice from receiving this medicine? Side effects that you should report to your doctor or health care professional as soon as possible: -allergic reactions like skin rash, itching or hives, swelling of the face, lips, or tongue -bone pain -breathing problems -changes in vision -chest pain -feeling faint or lightheaded, falls -fever, chills -pain, swelling, warmth in the leg -pain, tingling, numbness in the hands or feet -signs and symptoms of low blood pressure like dizziness; feeling faint or lightheaded, falls; unusually weak or tired -stomach pain -swelling of the ankles, feet, hands -trouble passing urine or change in the amount of urine -unusually high or low blood pressure -unusually weak or tired Side effects that usually do not require medical attention (report to your doctor or health care professional if they continue or are  bothersome): -change in sex drive or performance -changes in breast size in both males and females -changes in emotions or moods -headache -hot flashes -irritation at site where injected -loss of appetite -skin problems like acne, dry skin -vaginal dryness This list may not describe all possible side effects. Call your doctor for medical advice about side effects. You may report side effects to FDA at 1-800-FDA-1088. Where should I keep my medicine? This drug is given in a hospital or clinic and will not be stored at home. NOTE: This sheet is a summary. It may not cover all possible information. If you have questions about this medicine, talk to your doctor, pharmacist, or health care provider.  2018 Elsevier/Gold Standard (2014-01-11 11:10:35)

## 2018-03-20 NOTE — Progress Notes (Signed)
[place]  [phone] [phone]  Clinic Follow Up Note   Patient Care Team: [name], MD as PCP - General (Family Medicine) [name] (Family Medicine) [name], MD as Consulting Physician (General Surgery) [name], MD as Consulting Physician (Hematology) Kyung Rudd, MD as Consulting Physician (Radiation Oncology)   Date of Service:  03/20/2018  CHIEF COMPLAINTS:  Follow up right breast cancer    Oncology History    Cancer Staging Malignant neoplasm of upper-outer quadrant of right breast in female, estrogen receptor positive ([place]) Staging form: Breast, AJCC 8th Edition - Clinical stage from 06/11/2017: Stage IB (cT3, cN0, cM0, G2, ER: Positive, PR: Positive, HER2: Positive) - Signed by [name], MD on 06/16/2017 - Pathologic stage from 07/22/2017: Stage IB (pT3(m), pN2a(sn), cM0, G2, ER: Positive, PR: Positive, HER2: Positive) - Signed by [name], NP on 08/04/2017      Malignant neoplasm of upper-outer quadrant of right breast in female, estrogen receptor positive ([place])   06/11/2017 Initial Biopsy    Diagnosis 06/11/17 Breast, right, needle core biopsy, 11:00 o'clock - INVASIVE DUCTAL CARCINOMA, G2      06/11/2017 Mammogram    [name] and MM Diagnostic Breast Tomo Bilateral 06/11/17 IMPRESSION: 1. Highly suspicious mass within the retroareolar right breast, extending from the 8:00 to 11:00 axes, anterior to posterior depth, extending anteriorly to the nipple with associated nipple retraction, measuring at least 5.5 cm by ultrasound  2. No sonographic evidence of metastatic lymphadenopathy in the right axilla. 3. No evidence of malignancy within the left breast.       06/11/2017 Initial Diagnosis    Malignant neoplasm of upper-outer quadrant of right breast in female, estrogen receptor positive ([place])      06/11/2017 Receptors her2    Estrogen Receptor: 100%, POSITIVE, STRONG STAINING  INTENSITY Progesterone Receptor: 100%, POSITIVE, STRONG STAINING INTENSITY Proliferation Marker Ki67: 12%Proliferation Marker Ki67: 12%  HER2 - **POSITIVE** RATIO OF HER2/CEP17 SIGNALS 2.55 AVERAGE HER2 COPY NUMBER PER CELL 4.85      06/25/2017 Genetic Testing    Negative genetic testing on the 9 gene STAT panel.  The STAT Breast cancer panel offered by [facility] includes sequencing and rearrangement analysis for the following 9 genes:  ATM, BRCA1, BRCA2, CDH1, CHEK2, PALB2, PTEN, STK11 and TP53.   The report date is June 25, 2017.  Negative genetic testing on the common hereditary cancer panel.  The Hereditary Gene Panel offered by [facility] includes sequencing and/or deletion duplication testing of the following 46 genes: APC, ATM, AXIN2, BARD1, BMPR1A, BRCA1, BRCA2, BRIP1, CDH1, CDKN2A (p14ARF), CDKN2A (p16INK4a), CHEK2, CTNNA1, DICER1, EPCAM (Deletion/duplication testing only), GREM1 (promoter region deletion/duplication testing only), KIT, MEN1, MLH1, MSH2, MSH3, MSH6, MUTYH, NBN, NF1, NHTL1, PALB2, PDGFRA, PMS2, POLD1, POLE, PTEN, RAD50, RAD51C, RAD51D, SDHB, SDHC, SDHD, SMAD4, SMARCA4. STK11, TP53, TSC1, TSC2, and VHL.  The following genes were evaluated for sequence changes only: SDHA and HOXB13 c.251G>A variant only.  The report date is June 25, 2017.       07/02/2017 Imaging    CT cAP 07/02/17 IMPRESSION: 1. Subareolar right breast mass. No compelling findings of nodal or metastatic involvement. 2. Old granulomatous disease. 3. There is a 7 mm enhancing or hyperdense focus posteriorly in segment 7 of the liver in the subcapsular region. By virtue of its small size this lesion is technically nonspecific although statistically likely to be a small benign lesions such as flash filling hemangioma. This may warrant surveillance. 4.  Prominent stool throughout  the colon favors constipation. 5. Degenerative disc disease and spondylosis at L5-S1 likely causing mild impingement.       07/02/2017 Imaging    Bone scan 07/02/17 IMPRESSION: Today' s exam is negative. I ascribed the tiny focus of activity just proximal to the left antecubital region to injection site.      07/22/2017 Surgery    RIGHT TOTAL MASTECTOMY WITH RIGHT AXILLARY SENTINEL LYMPH NODE BIOPSY, LEFT PROPHYLACTIC MASTECTOM and INSERTION PORT-A-CATH WITH [name] by [name] and [facility]       07/22/2017 Pathology Results    Diagnosis 1. Breast, simple mastectomy, Left - FIBROADENOMA. - NO MALIGNANCY IDENTIFIED. 2. 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 - ADDITIONAL FOCUS OF LOBULAR CARCINOMA, GRADE 2, SPANNING 2.2 CM. - INTERMEDIATE GRADE DUCTAL CARCINOMA IN SITU. - INVASIVE CARCINOMA COMES TO WITHIN 0.2 TO 0.3 CM OF THE DEEP MARGIN, FOCALLY. - TUMOR FOCALLY INVOLVES EPIDERMIS. - LYMPHOVASCULAR INVASION OF DERMAL LYMPHATICS. - ONE OF ONE LYMPH NODES NEGATIVE FOR CARCINOMA (0/1). - SEE ONCOLOGY TABLE. 3. Lymph node, sentinel, biopsy, Right axillary - ONE LYMPH NODE WITH ISOLATED TUMOR CELLS (0/1). 4. Lymph node, sentinel, biopsy, Right - METASTATIC CARCINOMA IN ONE OF ONE LYMPH NODES (1/1). 5. Lymph node, sentinel, biopsy, Right - ONE OF ONE LYMPH NODES NEGATIVE FOR CARCINOMA (0/1). 6. Lymph node, sentinel, biopsy, Right - METASTATIC CARCINOMA IN ONE OF ONE LYMPH NODES (1/1). - EXTRACAPSULAR EXTENSION. 7. Lymph node, sentinel, biopsy, Right - METASTATIC CARCINOMA IN ONE OF ONE LYMPH NODES (1/1). - EXTRACAPSULAR EXTENSION. 8. Lymph node, sentinel, biopsy, Right - ONE OF ONE LYMPH NODES NEGATIVE FOR CARCINOMA (0/1). 9. Lymph node, sentinel, biopsy, Right - ONE OF ONE LYMPH NODES NEGATIVE FOR CARCINOMA (0/1). 10. Lymph node, sentinel, biopsy, Right - METASTATIC CARCINOMA IN ONE OF ONE LYMPH NODES (1/1). - EXTRACAPSULAR EXTENSION. 11. Lymph node, biopsy, Right axillary - ONE OF ONE LYMPH NODES NEGATIVE FOR CARCINOMA (0/1).        07/22/2017 Receptors her2    Ductal carcinoma in primary breast tumor and node metastasis are ER 95%, PR 95% strongly positive, and HER2 + The lobular component in primary breast tumor is ER 95% positive, PR 95% positive, strong staining, HER-2 negative.       08/25/2017 Imaging    MRI abdomen done at [facility]:  Enhancing structure measing 5 mm is compatible with a flash filling hemangioma. No specific findings to suggest metastatic cancer      08/25/2017 Imaging    MRI Abdomen at [facility] 08/25/17  IMPRESSION: 1. Lesion in segment 7 of the liver has signal and enhancement characteristics compatible with a flash filling hemangioma. No specific findings identified to suggest metastatic disease.      08/29/2017 - 12/12/2017 Chemotherapy    adjuvant TCHP every 3 weeks for 6 cycles starting 08/29/17, followed by maintenance Herceptin with or without Perjeta for 6-12 months. Postponed and decreased Carbo to 675m and added Onpro with cycle 2 on 09/19/17 due to neutropenic fever. Carbplatin further reduced to 5075mstarting with cycle 3 due to thrombocytopenia. Herceptin was held for Cycle 4 and 5 due to worsening global longitudinal strain found on ECHO from 11/07/17.          09/04/2017 - 09/09/2017 Hospital Admission    Admit date: 09/04/2017 Discharge date: 09/09/2017  DISCHARGE DIAGNOSES:  Principal Problem:   [facility]:   Malignant neoplasm of upper-outer quadrant of right breast in female, estrogen receptor positive (  [place])   Port-A-Cath in place   Neutropenic fever ([place])   Hyponatremia   Malignant neoplasm of breast in female, estrogen receptor positive ([place])       12/28/2017 Imaging    CT CHEST IMPRESSION: 1. 2.3 x 2.7 x 1.2 cm irregular masslike opacity within the right middle lobe worrisome for malignancy/metastasis. 2. 3 new very small sclerotic lesions, within the sternum and the body of T4, worrisome for new metastases.      12/28/2017 - 01/05/2018  Hospital Admission    Admit date: 12/28/17-01/05/18 Admission diagnosis: multifocal pneumonia, influenza A, sepsis, hypoxia Additional comments: imaging during hospitalization notable for new irregular masslike opacity in the right middle lobe worrisome for metastasis and new very small sclerotic lesion within the sternum and the body of T4, worrisome for metastasis      12/29/2017 Imaging    CT ABD/PELVIS IMPRESSION: No definite acute intra-abdominal or intrapelvic abnormalities.  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      12/30/2017 Imaging    CT HEAD IMPRESSION: No acute intracranial abnormality or enhancing lesion.      02/04/2018 PET scan    IMPRESSION: 1. The previous consolidation in the right middle lobe currently has a more nodular and reticular appearance with some associated mild atelectasis along its anterior margin. This demonstrates low-grade metabolic activity with maximum SUV 3.2 (background blood pool activity is 2.7). Although conceivably related to low-grade residuum from the patient's malignancy, the presence of calcified right hilar lymph nodes and small calcifications along the nodularity raise suspicion for a low-grade active granulomatous process or atypical infectious process. Surveillance is recommended. 2. Scattered deposits of hypermetabolic but benign metabolically active brown fat.      HISTORY OF PRESENTING ILLNESS: 11/23/08 Sheila [name] 42 y.o. female is here because of newly diagnosed Malignant neoplasm of upper-outer quadrant of right breast. She presents to the breast clinic today with her common law husband. She felt the lump initially when she had her physical 2 weeks ago, she had felt her nipples retracted several years ago. A few months ago it has become more pronounced. She had no pain or discharge.   In the past was diagnosed with Vitamin D deficiency and high cholesterol. She previously was on mood  stabilizing medication. She feel right now she does not need that medication but is overwhelmed. She donated eggs and previously had kidney stones removed. Her mother had breast cancer and was diagnosed at 21 along with paternal grandmother in her 43's.   Today she has heart burn which she is taking Prilosec and will see her GI later today. She has trouble sleeping and uses tylenol pm. She tried [place] which did not help, same with melatonin. Her common law husband Is on disability.  GYN HISTORY  Menarchal: 19 LMP: July 23rd, 2018 Contraceptive: 20-27 on birth control pill HRT: NA G0P0: does not want kids  CURRENT THERAPY:  Herceptin and Perjeta maintenance therapy every 3 weeks. Radiation therapy with Dr. Lisbeth Renshaw started on 02/23/18  INTERVAL HISTORY:  Sheila [name] is here for a follow up after starting RT on 02/23/18 and Zoladex injection. She is tolerating radiation realitivly well. Her last day of radiation treatment will be 04/12/2018. She endorses mild erythema, which is tolerable. She did not receive her injection last month because she needed approval from her insurance. She has been staying more active at home. She has not had a period yet and adds she has always been  irregular.   On review of systems, she reports hot flashes and worsening fatigue. She denies fever and any other symptoms or complaints at this time.   MEDICAL HISTORY:  Past Medical History:  Diagnosis Date  . Anxiety   . Cancer [facility])    right breast   . Family history of breast cancer   . Family history of colon cancer   . GERD (gastroesophageal reflux disease)   . Headache    couple of migraines in the past  . History of kidney stones   . Vitamin D deficiency    SURGICAL HISTORY: Past Surgical History:  Procedure Laterality Date  . egg donation    . kidney stone removal    . MASTECTOMY W/ SENTINEL NODE BIOPSY Bilateral 07/22/2017   Procedure: RIGHT TOTAL MASTECTOMY WITH RIGHT AXILLARY SENTINEL LYMPH NODE  BIOPSY, LEFT PROPHYLACTIC MASTECTOMY;  Surgeon: [name], MD;  Location: [place];  Service: General;  Laterality: Bilateral;  . PORTACATH PLACEMENT Right 07/22/2017   Procedure: INSERTION PORT-A-CATH WITH [name];  Surgeon: [name], MD;  Location: [place];  Service: General;  Laterality: Right;   SOCIAL HISTORY: Social History   Socioeconomic History  . Marital status: Single    Spouse name: Not on file  . Number of children: Not on file  . Years of education: Not on file  . Highest education level: Not on file  Occupational History  . Not on file  Social Needs  . Financial resource strain: Not on file  . Food insecurity:    Worry: Not on file    Inability: Not on file  . Transportation needs:    Medical: Not on file    Non-medical: Not on file  Tobacco Use  . Smoking status: Never Smoker  . Smokeless tobacco: Former Network engineer and Sexual Activity  . Alcohol use: Yes    Comment: once a year  . Drug use: No  . Sexual activity: Yes  Lifestyle  . Physical activity:    Days per week: Not on file    Minutes per session: Not on file  . Stress: Not on file  Relationships  . Social connections:    Talks on phone: Not on file    Gets together: Not on file    Attends religious service: Not on file    Active member of club or organization: Not on file    Attends meetings of clubs or organizations: Not on file    Relationship status: Not on file  . Intimate partner violence:    Fear of current or ex partner: Not on file    Emotionally abused: Not on file    Physically abused: Not on file    Forced sexual activity: Not on file  Other Topics Concern  . Not on file  Social History Narrative  . Not on file   FAMILY HISTORY: Family History  Problem Relation Age of Onset  . Breast cancer Paternal Grandmother 101  . Colon cancer Paternal Grandmother 61  . Breast cancer Mother 37  . Prostate cancer Paternal Grandfather        dx in his 67s  . Leukemia Paternal  Grandfather   . Lung cancer Maternal Uncle        heavy smoker  . Other Paternal Uncle        farm accident  . Stroke Maternal Grandfather    ALLERGIES:  has No Known Allergies.  MEDICATIONS:  Current Outpatient Medications  Medication Sig Dispense Refill  .  carvedilol (COREG) 3.125 MG tablet Take 3.125 mg by mouth 2 (two) times daily with a meal.    . Cholecalciferol (VITAMIN D) 2000 units tablet Take 2,000 Units by mouth daily.    . diphenoxylate-atropine (LOMOTIL) 2.5-0.025 MG tablet Take 1-2 tablets by mouth 4 (four) times daily as needed for diarrhea or loose stools. 30 tablet 2  . fluticasone (FLONASE) 50 MCG/ACT nasal spray Place 1 spray into both nostrils daily.    Marland Kitchen lidocaine-prilocaine (EMLA) cream Apply to affected area once (Patient taking differently: Apply 1 application topically once as needed (port access). Apply to affected area once) 30 g 3  . loperamide (IMODIUM) 2 MG capsule Take 1 capsule (2 mg total) by mouth 3 (three) times daily as needed for diarrhea or loose stools. 30 capsule 0  . LORazepam (ATIVAN) 0.5 MG tablet Take 1 tablet (0.5 mg total) by mouth every 8 (eight) hours as needed for anxiety or sleep (or nausea). 15 tablet 0  . naproxen sodium (ANAPROX) 220 MG tablet Take 220 mg 2 (two) times daily as needed by mouth (pain).     . prochlorperazine (COMPAZINE) 10 MG tablet Take 1 tablet (10 mg total) by mouth every 6 (six) hours as needed (Nausea or vomiting). 30 tablet 1  . venlafaxine XR (EFFEXOR-XR) 75 MG 24 hr capsule Take 1 capsule (75 mg total) by mouth daily with breakfast. 30 capsule 2  . zolpidem (AMBIEN) 5 MG tablet Take 1 tablet (5 mg total) by mouth at bedtime as needed for sleep. 30 tablet 2   Current Facility-Administered Medications  Medication Dose Route Frequency Provider Last Rate Last Dose  . gi cocktail (Maalox,Lidocaine,Donnatal)  30 mL Oral Once Harle Stanford., PA-C       Facility-Administered Medications Ordered in Other Visits    Medication Dose Route Frequency Provider Last Rate Last Dose  . sodium chloride flush (NS) 0.9 % injection 10 mL  10 mL Intracatheter PRN [name], MD   10 mL at 03/20/18 1246   REVIEW OF SYSTEMS:  Constitutional: Denies fevers, (+) moderate-severe hot flash, improved on Effexor (+) decreased energy Eyes: Denies blurriness of vision, double vision or watery eyes Ears, nose, mouth, throat, and face: Denies mucositis or sore throat Respiratory: Denies cough, dyspnea or wheezes Cardiovascular: Denies palpitation, chest discomfort or lower extremity swelling Gastrointestinal:  Denies nausea, heartburn or change in bowel habits   Skin: Denies abnormal skin rashes Lymphatics: Denies new lymphadenopathy or easy bruising Neurological:Denies numbness, tingling or new weaknesses Behavioral/Psych: Mood is stable, no new changes  Breast: (+) bilateral mastectomy on 07/22/17 All other systems were reviewed with the patient and are negative.  PHYSICAL EXAMINATION:  ECOG PERFORMANCE STATUS: 1 - Symptomatic but completely ambulatory  Vitals:   03/20/18 0908  BP: (!) 96/59  Pulse: 89  Resp: 18  Temp: 98.3 F (36.8 C)  SpO2: 99%   Filed Weights   03/20/18 0908  Weight: 153 lb 12.8 oz (69.8 kg)      GENERAL:alert, no distress and comfortable SKIN: skin color, texture, turgor are normal, no rashes or significant lesions EYES: normal, conjunctiva are pink and non-injected, sclera clear OROPHARYNX:no exudate, no erythema and lips, buccal mucosa, and tongue normal  NECK: supple, thyroid normal size, non-tender, without nodularity LYMPH:  no palpable lymphadenopathy in the cervical, axillary or inguinal LUNGS: clear to auscultation and percussion with normal breathing effort HEART: regular rate & rhythm and no murmurs and no lower extremity edema ABDOMEN:abdomen soft, non-tender and normal bowel sounds  Musculoskeletal:no cyanosis of digits and no clubbing  PSYCH: alert & oriented x 3 with fluent  speech NEURO: no focal motor/sensory deficits Breast: (+)bilateral mastectomy, incisions clean and intact, healed well.  Diffuse skin erythema on the right chest wall. No skin peeling  LABORATORY DATA:  I have reviewed the data as listed CBC Latest Ref Rng & Units 03/20/2018 02/27/2018 02/06/2018  WBC 3.9 - 10.3 K/uL 2.2(L) 3.8(L) 3.9  Hemoglobin 11.6 - 15.9 g/dL 12.0 12.3 12.7  Hematocrit 34.8 - 46.6 % 35.0 36.7 38.6  Platelets 145 - 400 K/uL 143(L) 145 181   CMP Latest Ref Rng & Units 03/20/2018 02/27/2018 02/06/2018  Glucose 70 - 140 mg/dL 93 130 104  BUN 7 - 26 mg/dL '15 12 12  '$ Creatinine 0.60 - 1.10 mg/dL 0.84 0.94 0.89  Sodium 136 - 145 mmol/L 138 141 141  Potassium 3.5 - 5.1 mmol/L 3.8 3.7 4.0  Chloride 98 - 109 mmol/L 108 108 107  CO2 22 - 29 mmol/L '23 26 25  '$ Calcium 8.4 - 10.4 mg/dL 9.6 9.8 10.0  Total Protein 6.4 - 8.3 g/dL 6.9 7.0 7.1  Total Bilirubin 0.2 - 1.2 mg/dL 0.8 0.7 0.5  Alkaline Phos 40 - 150 U/L 78 74 73  AST 5 - 34 U/L '16 13 19  '$ ALT 0 - 55 U/L '15 14 22   '$ PATHOLOGY  Diagnosis 07/22/17 1. Breast, simple mastectomy, Left - FIBROADENOMA. - NO MALIGNANCY IDENTIFIED. 2. 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 - ADDITIONAL FOCUS OF LOBULAR CARCINOMA, GRADE 2, SPANNING 2.2 CM. - INTERMEDIATE GRADE DUCTAL CARCINOMA IN SITU. - INVASIVE CARCINOMA COMES TO WITHIN 0.2 TO 0.3 CM OF THE DEEP MARGIN, FOCALLY. - TUMOR FOCALLY INVOLVES EPIDERMIS. - LYMPHOVASCULAR INVASION OF DERMAL LYMPHATICS. - ONE OF ONE LYMPH NODES NEGATIVE FOR CARCINOMA (0/1). - SEE ONCOLOGY TABLE. 3. Lymph node, sentinel, biopsy, Right axillary - ONE LYMPH NODE WITH ISOLATED TUMOR CELLS (0/1). 4. Lymph node, sentinel, biopsy, Right - METASTATIC CARCINOMA IN ONE OF ONE LYMPH NODES (1/1). 5. Lymph node, sentinel, biopsy, Right - ONE OF ONE LYMPH NODES NEGATIVE FOR CARCINOMA (0/1). 6. Lymph node, sentinel, biopsy, Right - METASTATIC  CARCINOMA IN ONE OF ONE LYMPH NODES (1/1). - EXTRACAPSULAR EXTENSION. 7. Lymph node, sentinel, biopsy, Right - METASTATIC CARCINOMA IN ONE OF ONE LYMPH NODES (1/1). - EXTRACAPSULAR EXTENSION. 8. Lymph node, sentinel, biopsy, Right - ONE OF ONE LYMPH NODES NEGATIVE FOR CARCINOMA (0/1). 9. Lymph node, sentinel, biopsy, Right - ONE OF ONE LYMPH NODES NEGATIVE FOR CARCINOMA (0/1). 10. Lymph node, sentinel, biopsy, Right - METASTATIC CARCINOMA IN ONE OF ONE LYMPH NODES (1/1). - EXTRACAPSULAR EXTENSION. 11. Lymph node, biopsy, Right axillary - ONE OF ONE LYMPH NODES NEGATIVE FOR CARCINOMA (0/1). Microscopic Comment 2. BREAST, INVASIVE TUMOR Microscopic Comment(continued) Procedure: Bilateral simple mastectomies with right axillary sentinel lymph node biopsies. Laterality: Right. Tumor Size: 5.3 cm. Histologic Type: Mixed lobular and ductal carcinoma. Grade: 2 Tubular Differentiation: 2 Nuclear Pleomorphism: 3 Mitotic Count: 1 Ductal Carcinoma in Situ (DCIS): Present, intermediate grade. Extent of Tumor: Involves epidermis of nipple focally. Margins: Invasive carcinoma, distance from closest margin: 0.2-0.3 cm of posterior margin (smaller mass). DCIS, distance from closest margin: >0.5 cm all margins Regional Lymph Nodes: Number of Lymph Nodes Examined: 10 Number of Sentinel Lymph Nodes Examined: 9 Lymph Nodes with Macrometastases: 4 Lymph Nodes with Micrometastases: 0 Lymph Nodes with Isolated Tumor Cells: 1 Breast Prognostic Profile: Performed on biopsy ([id number]), see  below. Will be performed on additional lobular focus. Estrogen Receptor: Positive, 100% strong staining. Progesterone Receptor: Positive, 100% strong staining. Her2: Positive (ratio 2.55). Ki-67: 12%. Best tumor block for sendout testing: 2B (larger focus), 2E (smaller focus). Pathologic Stage Classification (pTNM, AJCC 8th Edition): Primary Tumor (pT): mpT3 Regional Lymph Nodes (pN): pN2a Distant Metastases  (pM): pMX  2. FLUORESCENCE IN-SITU HYBRIDIZATION Results: HER2 - NEGATIVE RATIO OF HER2/CEP17 SIGNALS 1.71 AVERAGE HER2 COPY NUMBER PER CELL 1.80 2. PROGNOSTIC INDICATORS Results: IMMUNOHISTOCHEMICAL AND MORPHOMETRIC ANALYSIS PERFORMED MANUALLY Estrogen Receptor: 95%, POSITIVE, STRONG STAINING INTENSITY Progesterone Receptor: 95%, POSITIVE, STRONG STAINING INTENSITY Proliferation Marker Ki67: 10%  4. FLUORESCENCE IN-SITU HYBRIDIZATION Results: HER2 - **POSITIVE**. OF NOTE, A TOTAL OF 40 TUMOR CELLS WERE EVALUATED FOR HER2 EXPRESSION. RATIO OF HER2/CEP17 SIGNALS 2.01 AVERAGE HER2 COPY NUMBER PER CELL 3.63  Estrogen Receptor: 95%, POSITIVE, STRONG STAINING INTENSITY Progesterone Receptor: 95%, POSITIVE, STRONG STAINING INTENSITY  Addendum 1. By immunohistochemistry, the tumor cells in part 2 are Negative for Her2 (1+).  2. The current prognostic profile was performed on the additional focus of pure lobular carcinoma.  Diagnosis 06/11/17 Breast, right, needle core biopsy, 11:00 o'clock - INVASIVE DUCTAL CARCINOMA, SEE COMMENT. Microscopic Comment The carcinoma appears grade 2. Prognostic markers will be ordered. Dr. Lyndon Code has reviewed the case. The case was called to [facility] on 06/12/2017. Results: HER2 - **POSITIVE** RATIO OF HER2/CEP17 SIGNALS 2.55 AVERAGE HER2 COPY NUMBER PER CELL 4.85    PROCEDURES  ECHO expected 08/12/17  RADIOGRAPHIC STUDIES: I have personally reviewed the radiological images as listed and agreed with the findings in the report. No results found.   PET Scan 02/04/18  IMPRESSION: 1. The previous consolidation in the right middle lobe currently has a more nodular and reticular appearance with some associated mild atelectasis along its anterior margin. This demonstrates low-grade metabolic activity with maximum SUV 3.2 (background blood pool activity is 2.7). Although conceivably related to low-grade residuum from the  patient's malignancy, the presence of calcified right hilar lymph nodes and small calcifications along the nodularity raise suspicion for a low-grade active granulomatous process or atypical infectious process. Surveillance is recommended. 2. Scattered deposits of hypermetabolic but benign metabolically active brown fat.  CT AP W Contrast 12/29/17 IMPRESSION: No definite acute intra-abdominal or intrapelvic abnormalities. 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  CT Chest W Contrast 12/28/17 IMPRESSION: 1. 2.3 x 2.7 x 1.2 cm irregular masslike opacity within the right middle lobe worrisome for malignancy/metastasis. 2. 3 new very small sclerotic lesions, within the sternum and the body of T4, worrisome for new metastases.  CT AP W Contrast 09/05/17 IMPRESSION: Stable 7 mm hyperdensity seen in right hepatic lobe most consistent with hemangioma based on recent MRI. 2.4 cm right ovarian involuting cyst is noted. No other abnormality seen in the abdomen or pelvis.  MRI Abdomen at [facility] 08/25/17  IMPRESSION: 1. Lesion in segment 7 of the liver has signal and enhancement characteristics compatible with a flash filling hemangioma. No specific findings identified to suggest metastatic disease.  ASSESSMENT & PLAN:  Lucresha Dismuke is a 42 y.o. premenopausal female with a history of Vitamin D deficiency and high cholesterol and kidney stones, presented with a palpable right breast mass.   1. Malignant neoplasm of upper-outer quadrant of right breast in female, mixed invasive ductal and lobular carcinoma, pT3(m)N2aM0, stage 1b, ductal carcinoma triple positive, lobular carcinoma ER+/PR+/HER2-,  Grade 2 -She underwent staging CT and bone scan  on 07/02/2017 prior to surgery, both were negative for metastasis. -There was a 28m enhancing or hyperdense focus posteriorly in the liver which is technically nonspecific, and her abdominal MRI showed this lesion is  likely hemangioma. -I previously reviewed the surgical pathology with the patient and her husband in detail.  -Final diagnosis from the right simple mastectomy and sentinel lymph node biopsy on 07/22/2017 revealed mixed invasive lobular and ductal carcinoma, grade 2, spanning 5.3 cm. There is an additional focus of lobular carcinoma, grade 2, spanning 2.2cm and it is ER+/PR+/HER2-. 4 of 10 lymph nodes tested positive for macrometastases and they are HER2- ductal carcinoma.  -Her initial biopsy was HER2+ by FISH (ratio 2.55 and copy number 4.85). The HER2 testing criteria has been updated by [facility] in 2018, I requested additional HER2 by IHC on her positive node, and the percentage of ductal vs lobular components in the primary tumor  -We reviewed the risk of cancer recurrence after complete surgical resection. Giving the large size of tumor, multiple positive lymph nodes, HER-2 positive disease, she has very high risk for recurrence. She completed adjuvant chemo with TCHP.   -She initially preferred reconstruction. She saw Dr. TIran Planasbut ultimately did not opt for reconstruction.  -Given her ER/PR positive tumor and her young age, I recommend ovarian suppression and adjuvant antiestrogen therapy with AI. --Pt was admitted to the hospital on 12/28/17 with complaints of flulike symptoms and worked up for sepsis due to influenza. CT Chest from 2/12 showed multifocal/patchy opacities in RUL, RML, and bilateral lower lobes, that was suspicious for multifocal pneumonia. I saw the pt and discussed the other findings of the CT with her and her husband. It also revealed a new 2.7 cm irregular mass in the right middle lobe lung, and small tiny bone lesions in the sternum and T4 that are suspicious for metastatic disease. she was treated for influenza and superimposed pneumonia.  She has had a slow recovery from the hospitalization. -PET Scan from 02/04/18 revealed a small amount of uptake in the right lung that is about  80% resolved from her previous CT. I discussed her results and reviewed the scan and previous scans with pt and her husband in person. I do not suspect that this is metastasis and rather inflammation from pneumonia. Will continue to monitor and I will get a restaging scan in 2-3 months.  -Plan for her to continue Herceptin and perjeta today, complete HER2 results are not back. Will discuss treatment options again next visit -She started adjuvant RT with Dr. MLisbeth Renshawon 02/23/18, plan to complete in 5-6 weeks -Patient agrees with ovarian suppression, will start Zoladex injection today, plan to start aromatase inhibitor after she completes radiation.   2. Genetics -Due to her family history of breast cancer, she agreed to a genetics referral -She was negative for all mutations tested   3. Insomnia  -She has history of insomnia failed multiple agents such as lunesta, trazodone, tylenol pm, and xanax. -She was prescribed Remeron for her to try on 10/10/17. I previously explained this may also help with her anxiety related to treatment and side effects. -Remeron has helped mildly. She would like to increase dose to '30mg'$ . I called in on 10/31/17 -She reports that Ativan has alleviated her symptoms thus far and she voiced needing a refill. Refill provided previously (01/16/18) -We discussed potentially weaning her off Ativan for sleep, she has mild anxiety due to treatment. Previously, I refilled 15 tablets for her today and I prescribed  Ambien. She can use 0.5 tablet of Ativan or every other day alternating with Ambien. Also discussed sleep hygiene   4. Hot flash -Her hot flashes are moderate-severe, multiple times a day. She states she was previously on Effexor '75mg'$  for depression and had to discontinue due to to the Remeron. She is off Remeron right not.  -Restart '75mg'$  Effexor, prescribed 02/06/18 -much improved on Effexor    5. Bone Health  -I briefly discussed previously how an aromatase inhibitor can  weaken the bone -We will consider getting a DEXA in the future -She will start a Vitamin D supplement    PLAN:  -Lab, flush, f/u and hercept/perjeta injection in 3 weeks  -CT chest wo contrast one week before next visit    All questions were answered. The patient knows to call the clinic with any problems, questions or concerns.  I spent 20 minutes counseling the patient face to face. The total time spent in the appointment was 25 minutes and more than 50% was on counseling.  This document serves as a record of services personally performed by [name], MD. It was created on her behalf by Margit Banda, a trained medical scribe. The creation of this record is based on the scribe's personal observations and the provider's statements to them.   I have reviewed the above documentation for accuracy and completeness, and I agree with the above.   [name]  03/20/2018 2:26 PM

## 2018-03-20 NOTE — Telephone Encounter (Signed)
Scheduled appt per 5/3 los - and per treatment plan - gave patient avs and calender per los.

## 2018-03-23 ENCOUNTER — Ambulatory Visit
Admission: RE | Admit: 2018-03-23 | Discharge: 2018-03-23 | Disposition: A | Discharge status: Home / Self Care | Payer: BLUE CROSS/BLUE SHIELD | Source: Ambulatory Visit | Attending: Radiation Oncology | Admitting: Radiation Oncology

## 2018-03-23 DIAGNOSIS — Z510 Encounter for antineoplastic radiation therapy: Secondary | ICD-10-CM | POA: Diagnosis not present

## 2018-03-23 DIAGNOSIS — Z170 Estrogen receptor positive status [ER+]: Secondary | ICD-10-CM | POA: Diagnosis not present

## 2018-03-23 DIAGNOSIS — C50411 Malignant neoplasm of upper-outer quadrant of right female breast: Secondary | ICD-10-CM | POA: Diagnosis not present

## 2018-03-24 ENCOUNTER — Ambulatory Visit
Admission: RE | Admit: 2018-03-24 | Discharge: 2018-03-24 | Disposition: A | Discharge status: Home / Self Care | Payer: BLUE CROSS/BLUE SHIELD | Source: Ambulatory Visit | Attending: Radiation Oncology | Admitting: Radiation Oncology

## 2018-03-24 ENCOUNTER — Ambulatory Visit: Payer: BLUE CROSS/BLUE SHIELD | Admitting: Radiation Oncology

## 2018-03-24 DIAGNOSIS — C50411 Malignant neoplasm of upper-outer quadrant of right female breast: Secondary | ICD-10-CM | POA: Diagnosis not present

## 2018-03-24 DIAGNOSIS — Z510 Encounter for antineoplastic radiation therapy: Secondary | ICD-10-CM | POA: Diagnosis not present

## 2018-03-24 DIAGNOSIS — Z170 Estrogen receptor positive status [ER+]: Secondary | ICD-10-CM | POA: Diagnosis not present

## 2018-03-25 ENCOUNTER — Ambulatory Visit: Payer: BLUE CROSS/BLUE SHIELD | Admitting: Radiation Oncology

## 2018-03-25 ENCOUNTER — Ambulatory Visit
Admission: RE | Admit: 2018-03-25 | Discharge: 2018-03-25 | Disposition: A | Discharge status: Home / Self Care | Payer: BLUE CROSS/BLUE SHIELD | Source: Ambulatory Visit | Attending: Radiation Oncology | Admitting: Radiation Oncology

## 2018-03-25 DIAGNOSIS — C50411 Malignant neoplasm of upper-outer quadrant of right female breast: Secondary | ICD-10-CM | POA: Diagnosis not present

## 2018-03-25 DIAGNOSIS — Z170 Estrogen receptor positive status [ER+]: Secondary | ICD-10-CM | POA: Diagnosis not present

## 2018-03-25 DIAGNOSIS — Z510 Encounter for antineoplastic radiation therapy: Secondary | ICD-10-CM | POA: Diagnosis not present

## 2018-03-26 ENCOUNTER — Ambulatory Visit
Admission: RE | Admit: 2018-03-26 | Discharge: 2018-03-26 | Disposition: A | Discharge status: Home / Self Care | Payer: BLUE CROSS/BLUE SHIELD | Source: Ambulatory Visit | Attending: Radiation Oncology | Admitting: Radiation Oncology

## 2018-03-26 DIAGNOSIS — C50411 Malignant neoplasm of upper-outer quadrant of right female breast: Secondary | ICD-10-CM | POA: Diagnosis not present

## 2018-03-26 DIAGNOSIS — Z510 Encounter for antineoplastic radiation therapy: Secondary | ICD-10-CM | POA: Diagnosis not present

## 2018-03-26 DIAGNOSIS — Z170 Estrogen receptor positive status [ER+]: Secondary | ICD-10-CM | POA: Diagnosis not present

## 2018-03-27 ENCOUNTER — Ambulatory Visit
Admission: RE | Admit: 2018-03-27 | Discharge: 2018-03-27 | Disposition: A | Discharge status: Home / Self Care | Payer: BLUE CROSS/BLUE SHIELD | Source: Ambulatory Visit | Attending: Radiation Oncology | Admitting: Radiation Oncology

## 2018-03-27 DIAGNOSIS — C50411 Malignant neoplasm of upper-outer quadrant of right female breast: Secondary | ICD-10-CM | POA: Diagnosis not present

## 2018-03-27 DIAGNOSIS — Z510 Encounter for antineoplastic radiation therapy: Secondary | ICD-10-CM | POA: Diagnosis not present

## 2018-03-27 DIAGNOSIS — Z170 Estrogen receptor positive status [ER+]: Secondary | ICD-10-CM | POA: Diagnosis not present

## 2018-03-30 ENCOUNTER — Ambulatory Visit
Admission: RE | Admit: 2018-03-30 | Discharge: 2018-03-30 | Disposition: A | Discharge status: Home / Self Care | Payer: BLUE CROSS/BLUE SHIELD | Source: Ambulatory Visit | Attending: Radiation Oncology | Admitting: Radiation Oncology

## 2018-03-30 DIAGNOSIS — Z170 Estrogen receptor positive status [ER+]: Secondary | ICD-10-CM | POA: Diagnosis not present

## 2018-03-30 DIAGNOSIS — C50411 Malignant neoplasm of upper-outer quadrant of right female breast: Secondary | ICD-10-CM | POA: Diagnosis not present

## 2018-03-30 DIAGNOSIS — Z510 Encounter for antineoplastic radiation therapy: Secondary | ICD-10-CM | POA: Diagnosis not present

## 2018-03-31 ENCOUNTER — Ambulatory Visit
Admission: RE | Admit: 2018-03-31 | Discharge: 2018-03-31 | Disposition: A | Discharge status: Home / Self Care | Payer: BLUE CROSS/BLUE SHIELD | Source: Ambulatory Visit | Attending: Radiation Oncology | Admitting: Radiation Oncology

## 2018-03-31 DIAGNOSIS — Z170 Estrogen receptor positive status [ER+]: Secondary | ICD-10-CM | POA: Diagnosis not present

## 2018-03-31 DIAGNOSIS — Z510 Encounter for antineoplastic radiation therapy: Secondary | ICD-10-CM | POA: Diagnosis not present

## 2018-03-31 DIAGNOSIS — C50411 Malignant neoplasm of upper-outer quadrant of right female breast: Secondary | ICD-10-CM | POA: Diagnosis not present

## 2018-04-01 ENCOUNTER — Ambulatory Visit
Admission: RE | Admit: 2018-04-01 | Discharge: 2018-04-01 | Disposition: A | Discharge status: Home / Self Care | Payer: BLUE CROSS/BLUE SHIELD | Source: Ambulatory Visit | Attending: Radiation Oncology | Admitting: Radiation Oncology

## 2018-04-01 DIAGNOSIS — Z510 Encounter for antineoplastic radiation therapy: Secondary | ICD-10-CM | POA: Diagnosis not present

## 2018-04-01 DIAGNOSIS — Z170 Estrogen receptor positive status [ER+]: Secondary | ICD-10-CM | POA: Diagnosis not present

## 2018-04-01 DIAGNOSIS — C50411 Malignant neoplasm of upper-outer quadrant of right female breast: Secondary | ICD-10-CM | POA: Diagnosis not present

## 2018-04-02 ENCOUNTER — Ambulatory Visit
Admission: RE | Admit: 2018-04-02 | Discharge: 2018-04-02 | Disposition: A | Discharge status: Home / Self Care | Payer: BLUE CROSS/BLUE SHIELD | Source: Ambulatory Visit | Attending: Radiation Oncology | Admitting: Radiation Oncology

## 2018-04-02 DIAGNOSIS — Z170 Estrogen receptor positive status [ER+]: Secondary | ICD-10-CM | POA: Diagnosis not present

## 2018-04-02 DIAGNOSIS — C50411 Malignant neoplasm of upper-outer quadrant of right female breast: Secondary | ICD-10-CM | POA: Diagnosis not present

## 2018-04-02 DIAGNOSIS — Z510 Encounter for antineoplastic radiation therapy: Secondary | ICD-10-CM | POA: Diagnosis not present

## 2018-04-03 ENCOUNTER — Ambulatory Visit
Admission: RE | Admit: 2018-04-03 | Discharge: 2018-04-03 | Disposition: A | Discharge status: Home / Self Care | Payer: BLUE CROSS/BLUE SHIELD | Source: Ambulatory Visit | Attending: Radiation Oncology | Admitting: Radiation Oncology

## 2018-04-03 DIAGNOSIS — C50411 Malignant neoplasm of upper-outer quadrant of right female breast: Secondary | ICD-10-CM | POA: Diagnosis not present

## 2018-04-03 DIAGNOSIS — Z170 Estrogen receptor positive status [ER+]: Secondary | ICD-10-CM | POA: Diagnosis not present

## 2018-04-03 DIAGNOSIS — Z510 Encounter for antineoplastic radiation therapy: Secondary | ICD-10-CM | POA: Diagnosis not present

## 2018-04-06 ENCOUNTER — Ambulatory Visit
Admission: RE | Admit: 2018-04-06 | Discharge: 2018-04-06 | Disposition: A | Discharge status: Home / Self Care | Payer: BLUE CROSS/BLUE SHIELD | Source: Ambulatory Visit | Attending: Radiation Oncology | Admitting: Radiation Oncology

## 2018-04-06 ENCOUNTER — Ambulatory Visit (HOSPITAL_COMMUNITY)
Admission: RE | Admit: 2018-04-06 | Discharge: 2018-04-06 | Disposition: A | Discharge status: Home / Self Care | Payer: BLUE CROSS/BLUE SHIELD | Source: Ambulatory Visit | Attending: Hematology | Admitting: Hematology

## 2018-04-06 DIAGNOSIS — M899 Disorder of bone, unspecified: Secondary | ICD-10-CM | POA: Diagnosis not present

## 2018-04-06 DIAGNOSIS — C50411 Malignant neoplasm of upper-outer quadrant of right female breast: Secondary | ICD-10-CM | POA: Diagnosis not present

## 2018-04-06 DIAGNOSIS — I898 Other specified noninfective disorders of lymphatic vessels and lymph nodes: Secondary | ICD-10-CM | POA: Diagnosis not present

## 2018-04-06 DIAGNOSIS — Z510 Encounter for antineoplastic radiation therapy: Secondary | ICD-10-CM | POA: Diagnosis not present

## 2018-04-06 DIAGNOSIS — C50911 Malignant neoplasm of unspecified site of right female breast: Secondary | ICD-10-CM | POA: Diagnosis not present

## 2018-04-06 DIAGNOSIS — R918 Other nonspecific abnormal finding of lung field: Secondary | ICD-10-CM | POA: Insufficient documentation

## 2018-04-06 DIAGNOSIS — R911 Solitary pulmonary nodule: Secondary | ICD-10-CM

## 2018-04-06 DIAGNOSIS — Z170 Estrogen receptor positive status [ER+]: Secondary | ICD-10-CM | POA: Diagnosis not present

## 2018-04-07 ENCOUNTER — Ambulatory Visit
Admission: RE | Admit: 2018-04-07 | Discharge: 2018-04-07 | Disposition: A | Discharge status: Home / Self Care | Payer: BLUE CROSS/BLUE SHIELD | Source: Ambulatory Visit | Attending: Radiation Oncology | Admitting: Radiation Oncology

## 2018-04-07 DIAGNOSIS — Z170 Estrogen receptor positive status [ER+]: Secondary | ICD-10-CM | POA: Diagnosis not present

## 2018-04-07 DIAGNOSIS — Z510 Encounter for antineoplastic radiation therapy: Secondary | ICD-10-CM | POA: Diagnosis not present

## 2018-04-07 DIAGNOSIS — C50411 Malignant neoplasm of upper-outer quadrant of right female breast: Secondary | ICD-10-CM | POA: Diagnosis not present

## 2018-04-08 ENCOUNTER — Ambulatory Visit
Admission: RE | Admit: 2018-04-08 | Discharge: 2018-04-08 | Disposition: A | Discharge status: Home / Self Care | Payer: BLUE CROSS/BLUE SHIELD | Source: Ambulatory Visit | Attending: Radiation Oncology | Admitting: Radiation Oncology

## 2018-04-08 ENCOUNTER — Ambulatory Visit: Payer: BLUE CROSS/BLUE SHIELD

## 2018-04-08 DIAGNOSIS — Z170 Estrogen receptor positive status [ER+]: Secondary | ICD-10-CM | POA: Diagnosis not present

## 2018-04-08 DIAGNOSIS — Z510 Encounter for antineoplastic radiation therapy: Secondary | ICD-10-CM | POA: Diagnosis not present

## 2018-04-08 DIAGNOSIS — C50411 Malignant neoplasm of upper-outer quadrant of right female breast: Secondary | ICD-10-CM | POA: Diagnosis not present

## 2018-04-09 ENCOUNTER — Other Ambulatory Visit: Payer: Self-pay

## 2018-04-09 ENCOUNTER — Ambulatory Visit (HOSPITAL_COMMUNITY)
Admission: RE | Admit: 2018-04-09 | Discharge: 2018-04-09 | Disposition: A | Discharge status: Home / Self Care | Payer: BLUE CROSS/BLUE SHIELD | Source: Ambulatory Visit | Attending: Cardiology | Admitting: Cardiology

## 2018-04-09 ENCOUNTER — Ambulatory Visit
Admission: RE | Admit: 2018-04-09 | Discharge: 2018-04-09 | Disposition: A | Discharge status: Home / Self Care | Payer: BLUE CROSS/BLUE SHIELD | Source: Ambulatory Visit | Attending: Radiation Oncology | Admitting: Radiation Oncology

## 2018-04-09 ENCOUNTER — Encounter (HOSPITAL_COMMUNITY): Payer: Self-pay | Admitting: Cardiology

## 2018-04-09 ENCOUNTER — Encounter: Payer: Self-pay | Admitting: Radiation Oncology

## 2018-04-09 ENCOUNTER — Ambulatory Visit (HOSPITAL_BASED_OUTPATIENT_CLINIC_OR_DEPARTMENT_OTHER)
Admission: RE | Admit: 2018-04-09 | Discharge: 2018-04-09 | Disposition: A | Discharge status: Home / Self Care | Payer: BLUE CROSS/BLUE SHIELD | Source: Ambulatory Visit | Attending: Cardiology | Admitting: Cardiology

## 2018-04-09 VITALS — BP 100/65 | HR 94 | Wt 152.5 lb

## 2018-04-09 DIAGNOSIS — Z803 Family history of malignant neoplasm of breast: Secondary | ICD-10-CM | POA: Diagnosis not present

## 2018-04-09 DIAGNOSIS — Z79899 Other long term (current) drug therapy: Secondary | ICD-10-CM | POA: Insufficient documentation

## 2018-04-09 DIAGNOSIS — C50411 Malignant neoplasm of upper-outer quadrant of right female breast: Secondary | ICD-10-CM

## 2018-04-09 DIAGNOSIS — Z853 Personal history of malignant neoplasm of breast: Secondary | ICD-10-CM | POA: Diagnosis not present

## 2018-04-09 DIAGNOSIS — Z510 Encounter for antineoplastic radiation therapy: Secondary | ICD-10-CM | POA: Diagnosis not present

## 2018-04-09 DIAGNOSIS — Z170 Estrogen receptor positive status [ER+]: Secondary | ICD-10-CM | POA: Diagnosis not present

## 2018-04-09 DIAGNOSIS — Z800 Family history of malignant neoplasm of digestive organs: Secondary | ICD-10-CM | POA: Insufficient documentation

## 2018-04-09 DIAGNOSIS — Z9221 Personal history of antineoplastic chemotherapy: Secondary | ICD-10-CM | POA: Diagnosis not present

## 2018-04-09 DIAGNOSIS — Z923 Personal history of irradiation: Secondary | ICD-10-CM | POA: Diagnosis not present

## 2018-04-09 MED ORDER — LOSARTAN POTASSIUM 25 MG PO TABS: 12.5000 mg | ORAL_TABLET | Freq: Every day | ORAL | 3 refills | Status: DC

## 2018-04-09 NOTE — Progress Notes (Signed)
  Echocardiogram 2D Echocardiogram has been performed.  Bobbye Charleston 04/09/2018, 2:38 PM

## 2018-04-09 NOTE — Patient Instructions (Signed)
Start losartan 12.5 mg (1/2 tab) every night   Hold Herceptin   Your physician has requested that you have an echocardiogram. Echocardiography is a painless test that uses sound waves to create images of your heart. It provides your doctor with information about the size and shape of your heart and how well your heart's chambers and valves are working. This procedure takes approximately one hour. There are no restrictions for this procedure.  Your physician recommends that you schedule a follow-up appointment in: 6 weeks with Dr. Aundra Dubin  an a echocardiogram

## 2018-04-09 NOTE — Progress Notes (Signed)
[place]  [phone] [phone]) [phone]  Clinic Follow Up Note   Patient Care Team: [name], MD as PCP - General (Family Medicine) [name] (Family Medicine) [name], MD as Consulting Physician (General Surgery) [name], MD as Consulting Physician (Hematology) Kyung Rudd, MD as Consulting Physician (Radiation Oncology)   Date of Service:  04/10/2018  CHIEF COMPLAINTS:  Follow up right breast cancer    Oncology History    Cancer Staging Malignant neoplasm of upper-outer quadrant of right breast in female, estrogen receptor positive ([place]) Staging form: Breast, AJCC 8th Edition - Clinical stage from 06/11/2017: Stage IB (cT3, cN0, cM0, G2, ER: Positive, PR: Positive, HER2: Positive) - Signed by [name], MD on 06/16/2017 - Pathologic stage from 07/22/2017: Stage IB (pT3(m), pN2a(sn), cM0, G2, ER: Positive, PR: Positive, HER2: Positive) - Signed by [name], NP on 08/04/2017      Malignant neoplasm of upper-outer quadrant of right breast in female, estrogen receptor positive ([place])   06/11/2017 Initial Biopsy    Diagnosis 06/11/17 Breast, right, needle core biopsy, 11:00 o'clock - INVASIVE DUCTAL CARCINOMA, G2      06/11/2017 Mammogram    [name] and MM Diagnostic Breast Tomo Bilateral 06/11/17 IMPRESSION: 1. Highly suspicious mass within the retroareolar right breast, extending from the 8:00 to 11:00 axes, anterior to posterior depth, extending anteriorly to the nipple with associated nipple retraction, measuring at least 5.5 cm by ultrasound  2. No sonographic evidence of metastatic lymphadenopathy in the right axilla. 3. No evidence of malignancy within the left breast.       06/11/2017 Initial Diagnosis    Malignant neoplasm of upper-outer quadrant of right breast in female, estrogen receptor positive ([place])      06/11/2017 Receptors her2    Estrogen Receptor: 100%, POSITIVE, STRONG STAINING  INTENSITY Progesterone Receptor: 100%, POSITIVE, STRONG STAINING INTENSITY Proliferation Marker Ki67: 12%Proliferation Marker Ki67: 12%  HER2 - **POSITIVE** RATIO OF HER2/CEP17 SIGNALS 2.55 AVERAGE HER2 COPY NUMBER PER CELL 4.85      06/25/2017 Genetic Testing    Negative genetic testing on the 9 gene STAT panel.  The STAT Breast cancer panel offered by [facility] includes sequencing and rearrangement analysis for the following 9 genes:  ATM, BRCA1, BRCA2, CDH1, CHEK2, PALB2, PTEN, STK11 and TP53.   The report date is June 25, 2017.  Negative genetic testing on the common hereditary cancer panel.  The Hereditary Gene Panel offered by [facility] includes sequencing and/or deletion duplication testing of the following 46 genes: APC, ATM, AXIN2, BARD1, BMPR1A, BRCA1, BRCA2, BRIP1, CDH1, CDKN2A (p14ARF), CDKN2A (p16INK4a), CHEK2, CTNNA1, DICER1, EPCAM (Deletion/duplication testing only), GREM1 (promoter region deletion/duplication testing only), KIT, MEN1, MLH1, MSH2, MSH3, MSH6, MUTYH, NBN, NF1, NHTL1, PALB2, PDGFRA, PMS2, POLD1, POLE, PTEN, RAD50, RAD51C, RAD51D, SDHB, SDHC, SDHD, SMAD4, SMARCA4. STK11, TP53, TSC1, TSC2, and VHL.  The following genes were evaluated for sequence changes only: SDHA and HOXB13 c.251G>A variant only.  The report date is June 25, 2017.       07/02/2017 Imaging    CT cAP 07/02/17 IMPRESSION: 1. Subareolar right breast mass. No compelling findings of nodal or metastatic involvement. 2. Old granulomatous disease. 3. There is a 7 mm enhancing or hyperdense focus posteriorly in segment 7 of the liver in the subcapsular region. By virtue of its small size this lesion is technically nonspecific although statistically likely to be a small benign lesions such as flash filling hemangioma. This may warrant surveillance. 4.  Prominent stool throughout  the colon favors constipation. 5. Degenerative disc disease and spondylosis at L5-S1 likely causing mild impingement.       07/02/2017 Imaging    Bone scan 07/02/17 IMPRESSION: Today' s exam is negative. I ascribed the tiny focus of activity just proximal to the left antecubital region to injection site.      07/22/2017 Surgery    RIGHT TOTAL MASTECTOMY WITH RIGHT AXILLARY SENTINEL LYMPH NODE BIOPSY, LEFT PROPHYLACTIC MASTECTOM and INSERTION PORT-A-CATH WITH [name] by [name] and [facility]       07/22/2017 Pathology Results    Diagnosis 1. Breast, simple mastectomy, Left - FIBROADENOMA. - NO MALIGNANCY IDENTIFIED. 2. 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 - ADDITIONAL FOCUS OF LOBULAR CARCINOMA, GRADE 2, SPANNING 2.2 CM. - INTERMEDIATE GRADE DUCTAL CARCINOMA IN SITU. - INVASIVE CARCINOMA COMES TO WITHIN 0.2 TO 0.3 CM OF THE DEEP MARGIN, FOCALLY. - TUMOR FOCALLY INVOLVES EPIDERMIS. - LYMPHOVASCULAR INVASION OF DERMAL LYMPHATICS. - ONE OF ONE LYMPH NODES NEGATIVE FOR CARCINOMA (0/1). - SEE ONCOLOGY TABLE. 3. Lymph node, sentinel, biopsy, Right axillary - ONE LYMPH NODE WITH ISOLATED TUMOR CELLS (0/1). 4. Lymph node, sentinel, biopsy, Right - METASTATIC CARCINOMA IN ONE OF ONE LYMPH NODES (1/1). 5. Lymph node, sentinel, biopsy, Right - ONE OF ONE LYMPH NODES NEGATIVE FOR CARCINOMA (0/1). 6. Lymph node, sentinel, biopsy, Right - METASTATIC CARCINOMA IN ONE OF ONE LYMPH NODES (1/1). - EXTRACAPSULAR EXTENSION. 7. Lymph node, sentinel, biopsy, Right - METASTATIC CARCINOMA IN ONE OF ONE LYMPH NODES (1/1). - EXTRACAPSULAR EXTENSION. 8. Lymph node, sentinel, biopsy, Right - ONE OF ONE LYMPH NODES NEGATIVE FOR CARCINOMA (0/1). 9. Lymph node, sentinel, biopsy, Right - ONE OF ONE LYMPH NODES NEGATIVE FOR CARCINOMA (0/1). 10. Lymph node, sentinel, biopsy, Right - METASTATIC CARCINOMA IN ONE OF ONE LYMPH NODES (1/1). - EXTRACAPSULAR EXTENSION. 11. Lymph node, biopsy, Right axillary - ONE OF ONE LYMPH NODES NEGATIVE FOR CARCINOMA (0/1).        07/22/2017 Receptors her2    Ductal carcinoma in primary breast tumor and node metastasis are ER 95%, PR 95% strongly positive, and HER2 + The lobular component in primary breast tumor is ER 95% positive, PR 95% positive, strong staining, HER-2 negative.   Original biopsy [id number]: Ductal morphology, Her2 FISH positive. Current specimen: Main focus is 75% ductal morphology, 25% lobular morphology. Smaller focus is lobular morphology. #2A Lobular morphology, Her IHC negative. #2E Lobular morphology, Her2 FISH negative, Her2 IHC negative. #4 50% lobular and 50% ductal morphology, Her2 FISH positive, Her2 IHC negative. #6 Ductal morphology #7 Ductal morphology #10 50% lobular, 50% ductal morphology      08/25/2017 Imaging    MRI abdomen done at [facility]:  Enhancing structure measing 5 mm is compatible with a flash filling hemangioma. No specific findings to suggest metastatic cancer      08/25/2017 Imaging    MRI Abdomen at [facility] 08/25/17  IMPRESSION: 1. Lesion in segment 7 of the liver has signal and enhancement characteristics compatible with a flash filling hemangioma. No specific findings identified to suggest metastatic disease.      08/29/2017 - 12/12/2017 Chemotherapy    adjuvant TCHP every 3 weeks for 6 cycles starting 08/29/17, followed by maintenance Herceptin with or without Perjeta for 6-12 months. Postponed and decreased Carbo to '650mg'$  and added Onpro with cycle 2 on 09/19/17 due to neutropenic fever. Carbplatin further reduced to '500mg'$  starting with cycle 3 due to thrombocytopenia. Herceptin was held for  Cycle 4 and 5 due to worsening global longitudinal strain found on ECHO from 11/07/17.          09/04/2017 - 09/09/2017 Hospital Admission    Admit date: 09/04/2017 Discharge date: 09/09/2017  DISCHARGE DIAGNOSES:  Principal Problem:   Sepsis [facility]) Active Problems:   Malignant neoplasm of upper-outer quadrant of right breast in female, estrogen receptor positive  ([place])   Port-A-Cath in place   Neutropenic fever ([place])   Hyponatremia   Malignant neoplasm of breast in female, estrogen receptor positive ([place])       12/28/2017 Imaging    CT CHEST IMPRESSION: 1. 2.3 x 2.7 x 1.2 cm irregular masslike opacity within the right middle lobe worrisome for malignancy/metastasis. 2. 3 new very small sclerotic lesions, within the sternum and the body of T4, worrisome for new metastases.      12/28/2017 - 01/05/2018 Hospital Admission    Admit date: 12/28/17-01/05/18 Admission diagnosis: multifocal pneumonia, influenza A, sepsis, hypoxia Additional comments: imaging during hospitalization notable for new irregular masslike opacity in the right middle lobe worrisome for metastasis and new very small sclerotic lesion within the sternum and the body of T4, worrisome for metastasis      12/29/2017 Imaging    CT ABD/PELVIS IMPRESSION: No definite acute intra-abdominal or intrapelvic abnormalities.  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      12/30/2017 Imaging    CT HEAD IMPRESSION: No acute intracranial abnormality or enhancing lesion.      02/04/2018 PET scan    IMPRESSION: 1. The previous consolidation in the right middle lobe currently has a more nodular and reticular appearance with some associated mild atelectasis along its anterior margin. This demonstrates low-grade metabolic activity with maximum SUV 3.2 (background blood pool activity is 2.7). Although conceivably related to low-grade residuum from the patient's malignancy, the presence of calcified right hilar lymph nodes and small calcifications along the nodularity raise suspicion for a low-grade active granulomatous process or atypical infectious process. Surveillance is recommended. 2. Scattered deposits of hypermetabolic but benign metabolically active brown fat.      04/06/2018 Imaging    CT Chest wo contrast IMPRESSION: 1. Stable linear nodular  thickening in the RIGHT middle lobe is favored post infectious or inflammatory scarring. 2. Calcified RIGHT hilar lymph node is consistent post infectious process. 3. Stable small LEFT upper lobe nodule. 4. Stable sclerotic lesion in the manubrium. Sclerotic lesion at T3 is more prominent (4 mm). Sclerotic lesion at L 1 is new from 07/02/2017.      04/10/2018 Echocardiogram    LV EF 45%-50%      HISTORY OF PRESENTING ILLNESS: [birth year] Suan Halter 42 y.o. female is here because of newly diagnosed Malignant neoplasm of upper-outer quadrant of right breast. She presents to the breast clinic today with her common law husband. She felt the lump initially when she had her physical 2 weeks ago, she had felt her nipples retracted several years ago. A few months ago it has become more pronounced. She had no pain or discharge.   In the past was diagnosed with Vitamin D deficiency and high cholesterol. She previously was on mood stabilizing medication. She feel right now she does not need that medication but is overwhelmed. She donated eggs and previously had kidney stones removed. Her mother had breast cancer and was diagnosed at 56 along with paternal grandmother in her 20's.   Today she has heart burn which she is taking Prilosec and  will see her GI later today. She has trouble sleeping and uses tylenol pm. She tried [place] which did not help, same with melatonin. Her common law husband Is on disability.  GYN HISTORY  Menarchal: 19 LMP: July 23rd, 2018 Contraceptive: 20-27 on birth control pill HRT: NA G0P0: does not want kids  CURRENT THERAPY:  Herceptin and Perjeta maintenance therapy every 3 weeks.  INTERVAL HISTORY:  Evalynn Hankins is here for a follow up after completing RT on 04/09/18. She presents with her husband today. She is doing well overall.   She was recently evaluated by her cardiologist on yesterday and had an echocardiogram that showed LV EF of .45-50%.  Her next  echocardiogram is scheduled for July, 2019.   Labs today (04/10/18) of CBC and CMP is as follows:all values are WNL except for CBC with WBC at 1.5, RBC at 3.54, Hgb at 11.3, Neutro abs at 0.6. CMP showed: potassium at 3.4.   Since her last visit she had a CT Chest wo contrast on 04/06/2018 that showed: Stable linear nodular thickening in the RIGHT middle lobe is favored post infectious or inflammatory scarring. Calcified RIGHT hilar lymph node is consistent post infectious process. Stable small LEFT upper lobe nodule. Stable sclerotic lesion in the manubrium. Sclerotic lesion at T3 is more prominent (4 mm). Sclerotic lesion at L 1 is new from 07/02/2017.  On review of systems, she reports mild discharge under her right axilla at the radiation site, peeling to right radiation site. She is treating her radiation related skin changes with neosporin.. she denies SOB and any other symptoms. Pertinent positives are listed and detailed within the above HPI.  MEDICAL HISTORY:  Past Medical History:  Diagnosis Date  . Anxiety   . Cancer [facility])    right breast   . Family history of breast cancer   . Family history of colon cancer   . GERD (gastroesophageal reflux disease)   . Headache    couple of migraines in the past  . History of kidney stones   . Vitamin D deficiency    SURGICAL HISTORY: Past Surgical History:  Procedure Laterality Date  . egg donation    . kidney stone removal    . MASTECTOMY W/ SENTINEL NODE BIOPSY Bilateral 07/22/2017   Procedure: RIGHT TOTAL MASTECTOMY WITH RIGHT AXILLARY SENTINEL LYMPH NODE BIOPSY, LEFT PROPHYLACTIC MASTECTOMY;  Surgeon: [name], MD;  Location: [place];  Service: General;  Laterality: Bilateral;  . PORTACATH PLACEMENT Right 07/22/2017   Procedure: INSERTION PORT-A-CATH WITH [name];  Surgeon: [name], MD;  Location: [place];  Service: General;  Laterality: Right;   SOCIAL HISTORY: Social History   Socioeconomic History  . Marital status:  Single    Spouse name: Not on file  . Number of children: Not on file  . Years of education: Not on file  . Highest education level: Not on file  Occupational History  . Not on file  Social Needs  . Financial resource strain: Not on file  . Food insecurity:    Worry: Not on file    Inability: Not on file  . Transportation needs:    Medical: Not on file    Non-medical: Not on file  Tobacco Use  . Smoking status: Never Smoker  . Smokeless tobacco: Former Network engineer and Sexual Activity  . Alcohol use: Yes    Comment: once a year  . Drug use: No  . Sexual activity: Yes  Lifestyle  . Physical activity:  Days per week: Not on file    Minutes per session: Not on file  . Stress: Not on file  Relationships  . Social connections:    Talks on phone: Not on file    Gets together: Not on file    Attends religious service: Not on file    Active member of club or organization: Not on file    Attends meetings of clubs or organizations: Not on file    Relationship status: Not on file  . Intimate partner violence:    Fear of current or ex partner: Not on file    Emotionally abused: Not on file    Physically abused: Not on file    Forced sexual activity: Not on file  Other Topics Concern  . Not on file  Social History Narrative  . Not on file   FAMILY HISTORY: Family History  Problem Relation Age of Onset  . Breast cancer Paternal Grandmother 56  . Colon cancer Paternal Grandmother 26  . Breast cancer Mother 86  . Prostate cancer Paternal Grandfather        dx in his 10s  . Leukemia Paternal Grandfather   . Lung cancer Maternal Uncle        heavy smoker  . Other Paternal Uncle        farm accident  . Stroke Maternal Grandfather    ALLERGIES:  has No Known Allergies.  MEDICATIONS:  Current Outpatient Medications  Medication Sig Dispense Refill  . carvedilol (COREG) 3.125 MG tablet Take 3.125 mg by mouth 2 (two) times daily with a meal.    . Cholecalciferol  (VITAMIN D) 2000 units tablet Take 2,000 Units by mouth daily.    . diphenoxylate-atropine (LOMOTIL) 2.5-0.025 MG tablet Take 1-2 tablets by mouth 4 (four) times daily as needed for diarrhea or loose stools. 30 tablet 0  . fluticasone (FLONASE) 50 MCG/ACT nasal spray Place 1 spray into both nostrils daily.    Marland Kitchen lidocaine-prilocaine (EMLA) cream Apply to affected area once (Patient taking differently: Apply 1 application topically once as needed (port access). Apply to affected area once) 30 g 3  . loperamide (IMODIUM) 2 MG capsule Take 1 capsule (2 mg total) by mouth 3 (three) times daily as needed for diarrhea or loose stools. 30 capsule 0  . LORazepam (ATIVAN) 0.5 MG tablet Take 1 tablet (0.5 mg total) by mouth every 8 (eight) hours as needed for anxiety or sleep (or nausea). 15 tablet 0  . losartan (COZAAR) 25 MG tablet Take 0.5 tablets (12.5 mg total) by mouth at bedtime. 15 tablet 3  . losartan-hydrochlorothiazide (HYZAAR) 100-12.5 MG tablet Take 1 tablet by mouth daily.    . naproxen sodium (ANAPROX) 220 MG tablet Take 220 mg 2 (two) times daily as needed by mouth (pain).     . prochlorperazine (COMPAZINE) 10 MG tablet Take 1 tablet (10 mg total) by mouth every 6 (six) hours as needed (Nausea or vomiting). 30 tablet 1  . zolpidem (AMBIEN) 5 MG tablet Take 1 tablet (5 mg total) by mouth at bedtime as needed for sleep. 30 tablet 2  . letrozole (FEMARA) 2.5 MG tablet Take 1 tablet (2.5 mg total) by mouth daily. 30 tablet 3  . venlafaxine XR (EFFEXOR XR) 150 MG 24 hr capsule Take 1 capsule (150 mg total) by mouth daily with breakfast. 30 capsule 3   Current Facility-Administered Medications  Medication Dose Route Frequency Provider Last Rate Last Dose  . gi cocktail (Maalox,Lidocaine,Donnatal)  30 mL  Oral Once Harle Stanford., PA-C       REVIEW OF SYSTEMS:  Constitutional: Denies fevers, (+) moderate-severe hot flash, improved on Effexor (+) decreased energy Eyes: Denies blurriness of vision,  double vision or watery eyes Ears, nose, mouth, throat, and face: Denies mucositis or sore throat Respiratory: Denies cough, dyspnea or wheezes Cardiovascular: Denies palpitation, chest discomfort or lower extremity swelling Gastrointestinal:  Denies nausea, heartburn or change in bowel habits   Skin: Denies abnormal skin rashes Lymphatics: Denies new lymphadenopathy or easy bruising Neurological:Denies numbness, tingling or new weaknesses Behavioral/Psych: Mood is stable, no new changes  Breast: (+) bilateral mastectomy on 07/22/17 All other systems were reviewed with the patient and are negative.  PHYSICAL EXAMINATION:  ECOG PERFORMANCE STATUS: 1 - Symptomatic but completely ambulatory  Vitals:   04/10/18 1315  BP: 92/70  Pulse: (!) 110  Resp: 18  Temp: 98 F (36.7 C)  SpO2: 98%   Filed Weights   04/10/18 1315  Weight: 153 lb 11.2 oz (69.7 kg)      GENERAL:alert, no distress and comfortable SKIN: skin color, texture, turgor are normal, no rashes or significant lesions EYES: normal, conjunctiva are pink and non-injected, sclera clear OROPHARYNX:no exudate, no erythema and lips, buccal mucosa, and tongue normal  NECK: supple, thyroid normal size, non-tender, without nodularity LYMPH:  no palpable lymphadenopathy in the cervical, axillary or inguinal LUNGS: clear to auscultation and percussion with normal breathing effort HEART: regular rate & rhythm and no murmurs and no lower extremity edema ABDOMEN:abdomen soft, non-tender and normal bowel sounds Musculoskeletal:no cyanosis of digits and no clubbing  PSYCH: alert & oriented x 3 with fluent speech NEURO: no focal motor/sensory deficits Breast: (+)bilateral mastectomy, incisions clean and intact, healed well.  Diffuse hyperpigmentation in the right chest wall and axilla with mild skin peeling in the right axilla and lower right chest wall. No ulcers or discharge.   LABORATORY DATA:  I have reviewed the data as listed CBC  Latest Ref Rng & Units 04/10/2018 03/20/2018 02/27/2018  WBC 3.9 - 10.3 K/uL 1.5(L) 2.2(L) 3.8(L)  Hemoglobin 11.6 - 15.9 g/dL 11.3(L) 12.0 12.3  Hematocrit 34.8 - 46.6 % 33.3(L) 35.0 36.7  Platelets 145 - 400 K/uL 152 143(L) 145   CMP Latest Ref Rng & Units 04/10/2018 03/20/2018 02/27/2018  Glucose 70 - 140 mg/dL 132 93 130  BUN 7 - 26 mg/dL _0 Creatinine 0.60 - 1.10 mg/dL 0.94 0.84 0.94  Sodium 136 - 145 mmol/L 140 138 141  Potassium 3.5 - 5.1 mmol/L 3.4(L) 3.8 3.7  Chloride 98 - 109 mmol/L 108 108 108  CO2 22 - 29 mmol/L _1 Calcium 8.4 - 10.4 mg/dL 9.2 9.6 9.8  Total Protein 6.4 - 8.3 g/dL 6.7 6.9 7.0  Total Bilirubin 0.2 - 1.2 mg/dL 0.6 0.8 0.7  Alkaline Phos 40 - 150 U/L 78 78 74  AST 5 - 34 U/L _2 ALT 0 - 55 U/L _3 PATHOLOGY  Diagnosis 07/22/17 1. Breast, simple mastectomy, Left - FIBROADENOMA. - NO MALIGNANCY IDENTIFIED. 2. 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 - ADDITIONAL FOCUS OF LOBULAR CARCINOMA, GRADE 2, SPANNING 2.2 CM. - INTERMEDIATE GRADE DUCTAL CARCINOMA IN SITU. - INVASIVE CARCINOMA COMES TO WITHIN 0.2 TO 0.3 CM OF THE DEEP MARGIN, FOCALLY. - TUMOR FOCALLY INVOLVES EPIDERMIS. - LYMPHOVASCULAR INVASION OF DERMAL LYMPHATICS. - ONE OF ONE LYMPH NODES NEGATIVE FOR  CARCINOMA (0/1). - SEE ONCOLOGY TABLE. 3. Lymph node, sentinel, biopsy, Right axillary - ONE LYMPH NODE WITH ISOLATED TUMOR CELLS (0/1). 4. Lymph node, sentinel, biopsy, Right - METASTATIC CARCINOMA IN ONE OF ONE LYMPH NODES (1/1). 5. Lymph node, sentinel, biopsy, Right - ONE OF ONE LYMPH NODES NEGATIVE FOR CARCINOMA (0/1). 6. Lymph node, sentinel, biopsy, Right - METASTATIC CARCINOMA IN ONE OF ONE LYMPH NODES (1/1). - EXTRACAPSULAR EXTENSION. 7. Lymph node, sentinel, biopsy, Right - METASTATIC CARCINOMA IN ONE OF ONE LYMPH NODES (1/1). - EXTRACAPSULAR EXTENSION. 8. Lymph node, sentinel, biopsy, Right - ONE  OF ONE LYMPH NODES NEGATIVE FOR CARCINOMA (0/1). 9. Lymph node, sentinel, biopsy, Right - ONE OF ONE LYMPH NODES NEGATIVE FOR CARCINOMA (0/1). 10. Lymph node, sentinel, biopsy, Right - METASTATIC CARCINOMA IN ONE OF ONE LYMPH NODES (1/1). - EXTRACAPSULAR EXTENSION. 11. Lymph node, biopsy, Right axillary - ONE OF ONE LYMPH NODES NEGATIVE FOR CARCINOMA (0/1). Microscopic Comment 2. BREAST, INVASIVE TUMOR Microscopic Comment(continued) Procedure: Bilateral simple mastectomies with right axillary sentinel lymph node biopsies. Laterality: Right. Tumor Size: 5.3 cm. Histologic Type: Mixed lobular and ductal carcinoma. Grade: 2 Tubular Differentiation: 2 Nuclear Pleomorphism: 3 Mitotic Count: 1 Ductal Carcinoma in Situ (DCIS): Present, intermediate grade. Extent of Tumor: Involves epidermis of nipple focally. Margins: Invasive carcinoma, distance from closest margin: 0.2-0.3 cm of posterior margin (smaller mass). DCIS, distance from closest margin: >0.5 cm all margins Regional Lymph Nodes: Number of Lymph Nodes Examined: 10 Number of Sentinel Lymph Nodes Examined: 9 Lymph Nodes with Macrometastases: 4 Lymph Nodes with Micrometastases: 0 Lymph Nodes with Isolated Tumor Cells: 1 Breast Prognostic Profile: Performed on biopsy ([id number]), see below. Will be performed on additional lobular focus. Estrogen Receptor: Positive, 100% strong staining. Progesterone Receptor: Positive, 100% strong staining. Her2: Positive (ratio 2.55). Ki-67: 12%. Best tumor block for sendout testing: 2B (larger focus), 2E (smaller focus). Pathologic Stage Classification (pTNM, AJCC 8th Edition): Primary Tumor (pT): mpT3 Regional Lymph Nodes (pN): pN2a Distant Metastases (pM): pMX  2. FLUORESCENCE IN-SITU HYBRIDIZATION Results: HER2 - NEGATIVE RATIO OF HER2/CEP17 SIGNALS 1.71 AVERAGE HER2 COPY NUMBER PER CELL 1.80 2. PROGNOSTIC INDICATORS Results: IMMUNOHISTOCHEMICAL AND MORPHOMETRIC ANALYSIS  PERFORMED MANUALLY Estrogen Receptor: 95%, POSITIVE, STRONG STAINING INTENSITY Progesterone Receptor: 95%, POSITIVE, STRONG STAINING INTENSITY Proliferation Marker Ki67: 10%  4. FLUORESCENCE IN-SITU HYBRIDIZATION Results: HER2 - **POSITIVE**. OF NOTE, A TOTAL OF 40 TUMOR CELLS WERE EVALUATED FOR HER2 EXPRESSION. RATIO OF HER2/CEP17 SIGNALS 2.01 AVERAGE HER2 COPY NUMBER PER CELL 3.63  Estrogen Receptor: 95%, POSITIVE, STRONG STAINING INTENSITY Progesterone Receptor: 95%, POSITIVE, STRONG STAINING INTENSITY  Addendum 1. By immunohistochemistry, the tumor cells in part 2 are Negative for Her2 (1+).  2. The current prognostic profile was performed on the additional focus of pure lobular carcinoma.  Diagnosis 06/11/17 Breast, right, needle core biopsy, 11:00 o'clock - INVASIVE DUCTAL CARCINOMA, SEE COMMENT. Microscopic Comment The carcinoma appears grade 2. Prognostic markers will be ordered. Dr. Lyndon Code has reviewed the case. The case was called to [facility] on 06/12/2017. Results: HER2 - **POSITIVE** RATIO OF HER2/CEP17 SIGNALS 2.55 AVERAGE HER2 COPY NUMBER PER CELL 4.85    PROCEDURES  ECHO expected 08/12/17  RADIOGRAPHIC STUDIES: I have personally reviewed the radiological images as listed and agreed with the findings in the report. Ct Chest Wo Contrast  Result Date: 04/06/2018 CLINICAL DATA:  RIGHT breast carcinoma.  T3 into stage IB EXAM: CT CHEST WITHOUT CONTRAST TECHNIQUE: Multidetector CT imaging of the chest was performed following the standard protocol without  IV contrast. COMPARISON:  PET-CT 02/04/2018, CT 12/30/2017 FINDINGS: Cardiovascular: Port in the anterior chest wall with tip in distal SVC. No significant vascular findings. Normal heart size. No pericardial effusion. Mediastinum/Nodes: Lymphadenectomy clips in the RIGHT axilla. No enlarged axial lymph nodes. No supraclavicular nodes. No internal mammary nodes. No enlarged mediastinal hilar lymph  nodes. Esophagus normal. Calcified RIGHT hilar lymph nodes again noted Lungs/Pleura: The branching nodular lesion in the lateral aspect of the RIGHT middle lobe similar to CT 02/04/2018 and most suggestive of post infectious scarring. Small LEFT upper lobe nodule measuring 4 mm (image 56/7 is not changed CT 07/02/2017 Upper Abdomen: Limited view of the liver, kidneys, pancreas are unremarkable. Normal adrenal glands. Musculoskeletal: Small sclerotic lesion manubrium unchanged. Small sclerotic lesion in the T4 vertebral body is more prominent measuring 4 mm (image 76/6, sagittal series). Sclerotic lesion in the L1 vertebral body anteriorly seen on sagittal series 6, image 72. IMPRESSION: 1. Stable linear nodular thickening in the RIGHT middle lobe is favored post infectious or inflammatory scarring. 2. Calcified RIGHT hilar lymph node is consistent post infectious process. 3. Stable small LEFT upper lobe nodule. 4. Stable sclerotic lesion in the manubrium. Sclerotic lesion at T3 is more prominent (4 mm). Sclerotic lesion at L 1 is new from 07/02/2017. Electronically Signed   By: Suzy Bouchard M.D.   On: 04/06/2018 14:59     PET Scan 02/04/18  IMPRESSION: 1. The previous consolidation in the right middle lobe currently has a more nodular and reticular appearance with some associated mild atelectasis along its anterior margin. This demonstrates low-grade metabolic activity with maximum SUV 3.2 (background blood pool activity is 2.7). Although conceivably related to low-grade residuum from the patient's malignancy, the presence of calcified right hilar lymph nodes and small calcifications along the nodularity raise suspicion for a low-grade active granulomatous process or atypical infectious process. Surveillance is recommended. 2. Scattered deposits of hypermetabolic but benign metabolically active brown fat.  CT AP W Contrast 12/29/17 IMPRESSION: No definite acute intra-abdominal or intrapelvic  abnormalities. 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  CT Chest W Contrast 12/28/17 IMPRESSION: 1. 2.3 x 2.7 x 1.2 cm irregular masslike opacity within the right middle lobe worrisome for malignancy/metastasis. 2. 3 new very small sclerotic lesions, within the sternum and the body of T4, worrisome for new metastases.  CT AP W Contrast 09/05/17 IMPRESSION: Stable 7 mm hyperdensity seen in right hepatic lobe most consistent with hemangioma based on recent MRI. 2.4 cm right ovarian involuting cyst is noted. No other abnormality seen in the abdomen or pelvis.  MRI Abdomen at [facility] 08/25/17  IMPRESSION: 1. Lesion in segment 7 of the liver has signal and enhancement characteristics compatible with a flash filling hemangioma. No specific findings identified to suggest metastatic disease.  ASSESSMENT & PLAN:  Sheila [name] is a 42 y.o. premenopausal female with a history of Vitamin D deficiency and high cholesterol and kidney stones, presented with a palpable right breast mass.   1. Malignant neoplasm of upper-outer quadrant of right breast in female, mixed invasive ductal and lobular carcinoma, pT3(m)N2aM0, stage 1b, ductal carcinoma triple positive, lobular carcinoma ER+/PR+/HER2-,  Grade 2 -She underwent staging CT and bone scan on 07/02/2017 prior to surgery, both were negative for metastasis. -There was a 85m enhancing or hyperdense focus posteriorly in the liver which is technically nonspecific, and her abdominal MRI showed this lesion is likely hemangioma. -I previously reviewed the surgical pathology with the patient and  her husband in detail.  -Final diagnosis from the right simple mastectomy and sentinel lymph node biopsy on 07/22/2017 revealed mixed invasive lobular and ductal carcinoma, grade 2, spanning 5.3 cm. 75% are ductal histology. There is an additional focus of lobular carcinoma, grade 2, spanning 2.2cm and it is ER+/PR+/HER2-. 4 of 10  lymph nodes tested positive for macrometastases and they are HER2- ductal carcinoma except one was mixed ductal and lobular. I have discussed her final surgical path wit pathologist Dr. Tresa Moore in details.   -Her initial biopsy was HER2+ by FISH (ratio 2.55 and copy number 4.85) in her ductal carcinoma. Her lobular were HER2 negative.   -We reviewed the risk of cancer recurrence after complete surgical resection. Giving the large size of tumor, multiple positive lymph nodes, HER-2 positive disease, she has very high risk for recurrence. She completed adjuvant chemo with TCHP.  We will continue maintenance Herceptin and pejeta to complete 1 year of therapy.  -She initially preferred reconstruction. She saw Dr. Iran Planas but ultimately did not opt for reconstruction.  -Given her ER/PR positive tumor and her young age, I recommend ovarian suppression and adjuvant antiestrogen therapy with AI. She has started zodanex on 03/20/2018 ---CT Chest wo contrast, 04/06/2018 showed: Stable linear nodular thickening in the RIGHT middle lobe is favored post infectious or inflammatory scarring. Calcified RIGHT hilar lymph node is consistent post infectious process. Stable small LEFT upper lobe nodule. Stable sclerotic lesion in the manubrium. Sclerotic lesion at T3 is more prominent (4 mm). Sclerotic lesion at L 1 is new from 07/02/2017. I reviewed her scan findings with pt and her husband. I agree with the radiology interpretation. Her bone lesions are indeterminate, previously negative on PET, will be followed in 3 months with CT and bone scan.  -Patient completed radiation on 04/09/2018 with radiation dates of 02/23/2018-04/09/2018 -Her recent echo showed decreased EF 45-50%, Dr. Algernon Huxley has recommended to hold Herceptin and pejeta and plan to repeat ECHO in 6 weeks  --Given the strong ER and PR positivity, I do recommend adjuvant aromatase inhibitor to reduce her risk of cancer recurrence,  The potential benefit and side  effects, which includes but not limited to, hot flash, skin and vaginal dryness, metabolic changes ( increased blood glucose, cholesterol, weight, etc.), slightly in increased risk of cardiovascular disease, cataracts, muscular and joint discomfort, osteopenia and osteoporosis, etc, were discussed with her in great details. She is interested.  -Will start the patient on anti-estrogen therapy in approximately 2-3 weeks following completion of her radiation therapy to give time for the sites to heal.  -Will prescribe letrozole today, however the patient was instructed to wait approximately 2-3 weeks prior to taking this medication.  -Lab, flush, f/u and herceptin/perjeta on 7/12 if next shows recovered EF  -Continue monthly Zoladex injection, schedule injection on 6/3 and 7/3  -plan to repeat CT and bone scan in 3-4 months    2. Genetics -Due to her family history of breast cancer, she agreed to a genetics referral -She was negative for all mutations tested   3. Insomnia  -She has history of insomnia failed multiple agents such as lunesta, trazodone, tylenol pm, and xanax. -She was prescribed Remeron for her to try on 10/10/17. I previously explained this may also help with her anxiety related to treatment and side effects. -Remeron has helped mildly. She would like to increase dose to '30mg'$ . I called in on 10/31/17 -She reports that Ativan has alleviated her symptoms thus far and she voiced needing  a refill. Refill provided previously (01/16/18) -We previously discussed potentially weaning her off Ativan for sleep, she has mild anxiety due to treatment. Previously, I refilled 15 tablets for her today and I prescribed Ambien. She can use 0.5 tablet of Ativan or every other day alternating with Ambien. Also discussed sleep hygiene  -Patient notes that her sleep has improved with Ambien.  -I discussed that now that her symptoms improved with Rx Ambien, that the goal would be for the patient to wean off  of this medication as she wasn't on it prior to her cancer diagnosis. I discussed for the patient to wean off of ambien slowly as this is not a medication that is recommended to stay on chronically.   4. Hot flash -Her hot flashes are moderate-severe, multiple times a day. She states she was previously on Effexor 65m for depression and had to discontinue due to to the Remeron. She is off Remeron right not.  -Restart 71mEffexor, prescribed 02/06/18 -much improved on Effexor   -She is now taking 2 tablets of 75 mg Effexor daily with improvement of her hot flashes.  -Will prescribe 150 mg Effexor today, 04/10/2018  5. Bone Health  -I previously briefly discussed previously how an aromatase inhibitor can weaken the bone -We will consider getting a DEXA in the future -She currently taking Vitamin D supplement  -At this time, there is not a need for Vitamin C supplements, however, I discussed with the patient to consume vitamin C rich foods to aid with this.  -We will order a bone density scan in 1 year   PLAN:  -CT scan reviewed  -Due to decreased EF on recent ECHO, we will hold herceptin and perjeta for now  -will refill lomotil today  -will prescribe letrozole today, and patient instructed to start this medication in 2-3 weeks.  -Lab, flush, f/u and herceptin/perjeta on 7/12 if next echo shows recovered EF  Zoladex Injection on 6/3 and 7/3    All questions were answered. The patient knows to call the clinic with any problems, questions or concerns.  I spent 25 minutes counseling the patient face to face. The total time spent in the appointment was 30 minutes and more than 50% was on counseling.  This document serves as a record of services personally performed by YaTruitt MerleMD. It was created on her behalf by SoSteva Coldera trained medical scribe. The creation of this record is based on the scribe's personal observations and the provider's statements to them.   I have reviewed the above  documentation for accuracy and completeness, and I agree with the above    YaTruitt Merle5/24/2019 1:39 PM

## 2018-04-10 ENCOUNTER — Inpatient Hospital Stay (HOSPITAL_BASED_OUTPATIENT_CLINIC_OR_DEPARTMENT_OTHER): Payer: BLUE CROSS/BLUE SHIELD | Admitting: Hematology

## 2018-04-10 ENCOUNTER — Telehealth: Payer: Self-pay | Admitting: Hematology

## 2018-04-10 ENCOUNTER — Inpatient Hospital Stay: Payer: BLUE CROSS/BLUE SHIELD

## 2018-04-10 VITALS — BP 92/70 | HR 110 | Temp 98.0°F | Resp 18 | Ht 66.0 in | Wt 153.7 lb

## 2018-04-10 DIAGNOSIS — E559 Vitamin D deficiency, unspecified: Secondary | ICD-10-CM | POA: Diagnosis not present

## 2018-04-10 DIAGNOSIS — R911 Solitary pulmonary nodule: Secondary | ICD-10-CM | POA: Diagnosis not present

## 2018-04-10 DIAGNOSIS — Z793 Long term (current) use of hormonal contraceptives: Secondary | ICD-10-CM | POA: Diagnosis not present

## 2018-04-10 DIAGNOSIS — M5137 Other intervertebral disc degeneration, lumbosacral region: Secondary | ICD-10-CM | POA: Diagnosis not present

## 2018-04-10 DIAGNOSIS — Z170 Estrogen receptor positive status [ER+]: Secondary | ICD-10-CM

## 2018-04-10 DIAGNOSIS — M5127 Other intervertebral disc displacement, lumbosacral region: Secondary | ICD-10-CM

## 2018-04-10 DIAGNOSIS — Z79899 Other long term (current) drug therapy: Secondary | ICD-10-CM

## 2018-04-10 DIAGNOSIS — C50411 Malignant neoplasm of upper-outer quadrant of right female breast: Secondary | ICD-10-CM | POA: Diagnosis not present

## 2018-04-10 DIAGNOSIS — Z8042 Family history of malignant neoplasm of prostate: Secondary | ICD-10-CM

## 2018-04-10 DIAGNOSIS — Z801 Family history of malignant neoplasm of trachea, bronchus and lung: Secondary | ICD-10-CM

## 2018-04-10 DIAGNOSIS — Z5112 Encounter for antineoplastic immunotherapy: Secondary | ICD-10-CM | POA: Diagnosis not present

## 2018-04-10 DIAGNOSIS — Z806 Family history of leukemia: Secondary | ICD-10-CM

## 2018-04-10 DIAGNOSIS — R12 Heartburn: Secondary | ICD-10-CM

## 2018-04-10 DIAGNOSIS — R5383 Other fatigue: Secondary | ICD-10-CM

## 2018-04-10 DIAGNOSIS — Z87442 Personal history of urinary calculi: Secondary | ICD-10-CM

## 2018-04-10 DIAGNOSIS — Z95828 Presence of other vascular implants and grafts: Secondary | ICD-10-CM

## 2018-04-10 DIAGNOSIS — E7800 Pure hypercholesterolemia, unspecified: Secondary | ICD-10-CM

## 2018-04-10 DIAGNOSIS — Z9013 Acquired absence of bilateral breasts and nipples: Secondary | ICD-10-CM

## 2018-04-10 DIAGNOSIS — Z923 Personal history of irradiation: Secondary | ICD-10-CM | POA: Diagnosis not present

## 2018-04-10 DIAGNOSIS — F419 Anxiety disorder, unspecified: Secondary | ICD-10-CM | POA: Diagnosis not present

## 2018-04-10 DIAGNOSIS — R232 Flushing: Secondary | ICD-10-CM

## 2018-04-10 DIAGNOSIS — G4700 Insomnia, unspecified: Secondary | ICD-10-CM | POA: Diagnosis not present

## 2018-04-10 DIAGNOSIS — Z803 Family history of malignant neoplasm of breast: Secondary | ICD-10-CM

## 2018-04-10 LAB — COMPREHENSIVE METABOLIC PANEL
ALT: 17 U/L (ref 0–55)
ANION GAP: 10 (ref 3–11)
AST: 16 U/L (ref 5–34)
Albumin: 3.7 g/dL (ref 3.5–5.0)
Alkaline Phosphatase: 78 U/L (ref 40–150)
BILIRUBIN TOTAL: 0.6 mg/dL (ref 0.2–1.2)
BUN: 11 mg/dL (ref 7–26)
CO2: 22 mmol/L (ref 22–29)
Calcium: 9.2 mg/dL (ref 8.4–10.4)
Chloride: 108 mmol/L (ref 98–109)
Creatinine, Ser: 0.94 mg/dL (ref 0.60–1.10)
Glucose, Bld: 132 mg/dL (ref 70–140)
Potassium: 3.4 mmol/L — ABNORMAL LOW (ref 3.5–5.1)
Sodium: 140 mmol/L (ref 136–145)
TOTAL PROTEIN: 6.7 g/dL (ref 6.4–8.3)

## 2018-04-10 LAB — CBC WITH DIFFERENTIAL/PLATELET
Basophils Absolute: 0 10*3/uL (ref 0.0–0.1)
Basophils Relative: 2 %
EOS PCT: 6 %
Eosinophils Absolute: 0.1 10*3/uL (ref 0.0–0.5)
HCT: 33.3 % — ABNORMAL LOW (ref 34.8–46.6)
Hemoglobin: 11.3 g/dL — ABNORMAL LOW (ref 11.6–15.9)
LYMPHS ABS: 0.5 10*3/uL — AB (ref 0.9–3.3)
LYMPHS PCT: 36 %
MCH: 32 pg (ref 25.1–34.0)
MCHC: 34 g/dL (ref 31.5–36.0)
MCV: 94.2 fL (ref 79.5–101.0)
MONO ABS: 0.3 10*3/uL (ref 0.1–0.9)
MONOS PCT: 18 %
Neutro Abs: 0.6 10*3/uL — ABNORMAL LOW (ref 1.5–6.5)
Neutrophils Relative %: 38 %
Platelets: 152 10*3/uL (ref 145–400)
RBC: 3.54 MIL/uL — ABNORMAL LOW (ref 3.70–5.45)
RDW: 13.9 % (ref 11.2–14.5)
WBC: 1.5 10*3/uL — ABNORMAL LOW (ref 3.9–10.3)

## 2018-04-10 MED ORDER — LETROZOLE 2.5 MG PO TABS: 2.5000 mg | ORAL_TABLET | Freq: Every day | ORAL | 3 refills | Status: DC

## 2018-04-10 MED ORDER — SODIUM CHLORIDE 0.9% FLUSH
10.0000 mL | Freq: Once | INTRAVENOUS | Status: AC
  Administered 2018-04-10: 10 mL
  Filled 2018-04-10: qty 10

## 2018-04-10 MED ORDER — VENLAFAXINE HCL ER 150 MG PO CP24: 150.0000 mg | ORAL_CAPSULE | Freq: Every day | ORAL | 3 refills | Status: DC

## 2018-04-10 MED ORDER — DIPHENOXYLATE-ATROPINE 2.5-0.025 MG PO TABS: 1.0000 | ORAL_TABLET | Freq: Four times a day (QID) | ORAL | 0 refills | Status: DC | PRN

## 2018-04-10 NOTE — Progress Notes (Signed)
Oncology: Dr. Burr Medico  42 y.o. with history of breath cancer was referred by Dr. Burr Medico for cardio-oncology evaluation given planned Herceptin use.  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  She had bilateral mastectomies and axillary node biopsy in 7/18.  Planned for Taxol/carboplatin/Herceptin/Perjeta q 3 wks x 6 cycles starting 10/18, then maintenance Herceptin to complete a year.  She has finished the initial chemotherapy and has finished radiation.   No history of cardiac problems.  Nonsmoker.   Echo in 12/18 showed significantly less negative strain though EF was preserved.  I had her stop Herceptin for a month and started Coreg 3.125 mg bid. Echo in 1/19 showed improved strain so Herceptin was restarted.   She returns for followup today.  Symptomatically doing ok. No exertional dyspnea.  She does get fatigue with heavy activity. SBP in 90s.   Echo done and reviewed today, EF down to 45-50%.    Labs (5/19): K 3.8, creatinine 0.84  PMH: 1. Breast cancer: Diagnosed 7/18 on right, ER+/PR+/HER2+.  She had bilateral mastectomies and axillary node biopsy in 7/18.  Planned for Taxol/carboplatin/Herceptin/Perjeta q 3 wks x 6 cycles starting 10/12, then maintenance Herceptin to complete a year.  - Echo (9/18): EF 85-02%, normal diastolic function, GLS -77.4%.  - Echo (12/18): EF 12-87%, normal diastolic function, normal RV size and systolic function, GLS -86.7%.  - Echo (1/19): EF 60%, GLS -15%, normal RV size and systolic function.  - Echo (5/19): EF 45-50%, GLS -17.2%  Family History  Problem Relation Age of Onset  . Breast cancer Paternal Grandmother 85  . Colon cancer Paternal Grandmother 34  . Breast cancer Mother 16  . Prostate cancer Paternal Grandfather        dx in his 2s  . Leukemia Paternal Grandfather   . Lung cancer Maternal Uncle        heavy smoker  . Other Paternal Uncle        farm accident  . Stroke Maternal Grandfather    Social History    Socioeconomic History  . Marital status: Single    Spouse name: Not on file  . Number of children: Not on file  . Years of education: Not on file  . Highest education level: Not on file  Occupational History  . Not on file  Social Needs  . Financial resource strain: Not on file  . Food insecurity:    Worry: Not on file    Inability: Not on file  . Transportation needs:    Medical: Not on file    Non-medical: Not on file  Tobacco Use  . Smoking status: Never Smoker  . Smokeless tobacco: Former Network engineer and Sexual Activity  . Alcohol use: Yes    Comment: once a year  . Drug use: No  . Sexual activity: Yes  Lifestyle  . Physical activity:    Days per week: Not on file    Minutes per session: Not on file  . Stress: Not on file  Relationships  . Social connections:    Talks on phone: Not on file    Gets together: Not on file    Attends religious service: Not on file    Active member of club or organization: Not on file    Attends meetings of clubs or organizations: Not on file    Relationship status: Not on file  . Intimate partner violence:    Fear of current or ex partner: Not on file  Emotionally abused: Not on file    Physically abused: Not on file    Forced sexual activity: Not on file  Other Topics Concern  . Not on file  Social History Narrative  . Not on file   ROS: All systems reviewed and negative except as per HPI.  Current Outpatient Medications  Medication Sig Dispense Refill  . carvedilol (COREG) 3.125 MG tablet Take 3.125 mg by mouth 2 (two) times daily with a meal.    . Cholecalciferol (VITAMIN D) 2000 units tablet Take 2,000 Units by mouth daily.    . diphenoxylate-atropine (LOMOTIL) 2.5-0.025 MG tablet Take 1-2 tablets by mouth 4 (four) times daily as needed for diarrhea or loose stools. 30 tablet 2  . fluticasone (FLONASE) 50 MCG/ACT nasal spray Place 1 spray into both nostrils daily.    Marland Kitchen lidocaine-prilocaine (EMLA) cream Apply to  affected area once (Patient taking differently: Apply 1 application topically once as needed (port access). Apply to affected area once) 30 g 3  . loperamide (IMODIUM) 2 MG capsule Take 1 capsule (2 mg total) by mouth 3 (three) times daily as needed for diarrhea or loose stools. 30 capsule 0  . LORazepam (ATIVAN) 0.5 MG tablet Take 1 tablet (0.5 mg total) by mouth every 8 (eight) hours as needed for anxiety or sleep (or nausea). 15 tablet 0  . naproxen sodium (ANAPROX) 220 MG tablet Take 220 mg 2 (two) times daily as needed by mouth (pain).     . prochlorperazine (COMPAZINE) 10 MG tablet Take 1 tablet (10 mg total) by mouth every 6 (six) hours as needed (Nausea or vomiting). 30 tablet 1  . venlafaxine XR (EFFEXOR-XR) 75 MG 24 hr capsule Take 1 capsule (75 mg total) by mouth daily with breakfast. 30 capsule 2  . zolpidem (AMBIEN) 5 MG tablet Take 1 tablet (5 mg total) by mouth at bedtime as needed for sleep. 30 tablet 2  . losartan (COZAAR) 25 MG tablet Take 0.5 tablets (12.5 mg total) by mouth at bedtime. 15 tablet 3   Current Facility-Administered Medications  Medication Dose Route Frequency Provider Last Rate Last Dose  . gi cocktail (Maalox,Lidocaine,Donnatal)  30 mL Oral Once Tanner, Lucianne Lei E., PA-C       BP 100/65   Pulse 94   Wt 152 lb 8 oz (69.2 kg)   SpO2 100%   BMI 24.61 kg/m  General: NAD Neck: No JVD, no thyromegaly or thyroid nodule.  Lungs: Clear to auscultation bilaterally with normal respiratory effort. CV: Nondisplaced PMI.  Heart regular S1/S2, no S3/S4, no murmur.  No peripheral edema.  No carotid bruit.  Normal pedal pulses.  Abdomen: Soft, nontender, no hepatosplenomegaly, no distention.  Skin: Intact without lesions or rashes.  Neurologic: Alert and oriented x 3.  Psych: Normal affect. Extremities: No clubbing or cyanosis.  HEENT: Normal.   Assessment/Plan:  42 yo with history of breast cancer, 1 year of Herceptin therapy planned.  Her initial echo was normal. 12/18  echo showed that EF remained 60-65% but global longitudinal strain measurement was significantly less negative, down to -13.5% from -20.2%.  I had her stop Herceptin for a month and she had a repeat echo in 1/19 with stable EF and more negative strain, Herceptin was restarted. Today, EF is down to 45-50%.    - Stop Herceptin for now, sent note to Dr. Burr Medico.   - Continue Coreg 3.125 mg bid.  Will try to start low dose losartan 12.5 mg daily, take before bed.  BP may limit this.  - Repeat echo in 6 wks to look for improvement.    Loralie Champagne 04/10/2018

## 2018-04-10 NOTE — Telephone Encounter (Signed)
Scheduled appt per 5/24 los- patient aware of appts - my chart active did not want print out.

## 2018-04-12 ENCOUNTER — Encounter: Payer: Self-pay | Admitting: Hematology

## 2018-04-16 NOTE — Progress Notes (Signed)
  Radiation Oncology         [phone]) [phone] ________________________________  Name: Sheila [name] MRN: [id number]  Date: 04/09/2018  DOB: June 01, 1976  End of Treatment Note  Diagnosis:   42 y.o. female with pT3N2M0, grade 2, triple positive, mixed invasive ductal and invasive lobular carcinoma of the right breast    Indication for treatment:  Curative       Radiation treatment dates:   02/23/2018 - 04/09/2018  Site/dose:   The patient initially received a dose of 50.4 Gy in 28 fractions to the right chest wall and supraclavicular region. This was delivered using a 3-D conformal, 4 field technique. The patient then received a boost to the mastectomy scar. This delivered an additional 10 Gy in 5 fractions using an en face electron field. The total dose was 60.4 Gy.  Narrative: The patient tolerated radiation treatment relatively well.   The patient had some expected skin irritation as she progressed during treatment. Moist desquamation was not present at the end of treatment.  Plan: The patient has completed radiation treatment. The patient will return to radiation oncology clinic for routine followup in one month. I advised the patient to call or return sooner if they have any questions or concerns related to their recovery or treatment. ________________________________  Jodelle Gross, MD, PhD  This document serves as a record of services personally performed by Kyung Rudd, MD. It was created on his behalf by Rae Lips, a trained medical scribe. The creation of this record is based on the scribe's personal observations and the provider's statements to them. This document has been checked and approved by the attending provider.

## 2018-04-20 ENCOUNTER — Inpatient Hospital Stay: Payer: BLUE CROSS/BLUE SHIELD | Attending: Nurse Practitioner

## 2018-04-20 VITALS — BP 99/60 | HR 111 | Temp 98.1°F | Resp 18

## 2018-04-20 DIAGNOSIS — Z923 Personal history of irradiation: Secondary | ICD-10-CM | POA: Diagnosis not present

## 2018-04-20 DIAGNOSIS — Z9013 Acquired absence of bilateral breasts and nipples: Secondary | ICD-10-CM | POA: Diagnosis not present

## 2018-04-20 DIAGNOSIS — Z170 Estrogen receptor positive status [ER+]: Secondary | ICD-10-CM | POA: Insufficient documentation

## 2018-04-20 DIAGNOSIS — C50411 Malignant neoplasm of upper-outer quadrant of right female breast: Secondary | ICD-10-CM

## 2018-04-20 DIAGNOSIS — Z95828 Presence of other vascular implants and grafts: Secondary | ICD-10-CM

## 2018-04-20 MED ORDER — GOSERELIN ACETATE 3.6 MG ~~LOC~~ IMPL
DRUG_IMPLANT | SUBCUTANEOUS | Status: AC
  Filled 2018-04-20: qty 3.6

## 2018-04-20 MED ORDER — GOSERELIN ACETATE 3.6 MG ~~LOC~~ IMPL
3.6000 mg | DRUG_IMPLANT | Freq: Once | SUBCUTANEOUS | Status: AC
  Administered 2018-04-20: 3.6 mg via SUBCUTANEOUS

## 2018-04-23 ENCOUNTER — Other Ambulatory Visit: Payer: Self-pay | Admitting: Hematology

## 2018-05-04 ENCOUNTER — Encounter: Payer: Self-pay | Admitting: Physical Therapy

## 2018-05-13 ENCOUNTER — Other Ambulatory Visit: Payer: Self-pay

## 2018-05-13 ENCOUNTER — Encounter: Payer: Self-pay | Admitting: Radiation Oncology

## 2018-05-13 ENCOUNTER — Ambulatory Visit
Admission: RE | Admit: 2018-05-13 | Discharge: 2018-05-13 | Disposition: A | Discharge status: Home / Self Care | Payer: BLUE CROSS/BLUE SHIELD | Source: Ambulatory Visit | Attending: Radiation Oncology | Admitting: Radiation Oncology

## 2018-05-13 ENCOUNTER — Other Ambulatory Visit: Payer: Self-pay | Admitting: *Deleted

## 2018-05-13 VITALS — BP 97/68 | HR 120 | Temp 99.0°F | Resp 16 | Ht 66.0 in | Wt 152.2 lb

## 2018-05-13 DIAGNOSIS — C50411 Malignant neoplasm of upper-outer quadrant of right female breast: Secondary | ICD-10-CM

## 2018-05-13 DIAGNOSIS — Z170 Estrogen receptor positive status [ER+]: Principal | ICD-10-CM

## 2018-05-13 DIAGNOSIS — Z9013 Acquired absence of bilateral breasts and nipples: Secondary | ICD-10-CM | POA: Insufficient documentation

## 2018-05-13 DIAGNOSIS — Z923 Personal history of irradiation: Secondary | ICD-10-CM | POA: Insufficient documentation

## 2018-05-13 NOTE — Progress Notes (Signed)
Radiation Oncology         [phone] ________________________________  Name: Sheila [name] MRN: [id number]  Date of Service: 05/13/2018  DOB: 05-Oct-1976  Post Treatment Note  CC: [name], MD  [name], MD  Diagnosis:   Stage IB, [phone] triple positive, grade 2 invasive ductal carcinoma of the right breast.  Interval Since Last Radiation:  5 weeks    02/23/2018 - 04/09/2018:  The patient initially received a dose of 50.4 Gy in 28 fractions to the right chest wall and supraclavicular region. This was delivered using a 3-D conformal, 4 field technique. The patient then received a boost to the mastectomy scar. This delivered an additional 10 Gy in 5 fractions using an en face electron field. The total dose was 60.4 Gy.   Narrative:  The patient returns today for routine follow-up. During treatment she did very well with radiotherapy and did not have significant desquamation.                             On review of systems, the patient states she's doing pretty well. She's on hold with herceptin due to concerns about her EF. She sees Dr. Aundra Dubin in about two weeks to follow up with this. She reports her skin is looking better and less pigmented. An area that had desquamation has resolved and she reports she has noticed several episodes of light bleeding from her nares bilaterally though the right side has been more noticeable. She is using saline spray currently. No other complaints are verbalized.  ALLERGIES:  has No Known Allergies.  Meds: Current Outpatient Medications  Medication Sig Dispense Refill  . carvedilol (COREG) 3.125 MG tablet Take 3.125 mg by mouth 2 (two) times daily with a meal.    . Cholecalciferol (VITAMIN D) 2000 units tablet Take 2,000 Units by mouth daily.    . fluticasone (FLONASE) 50 MCG/ACT nasal spray Place 1 spray into both nostrils daily.    Marland Kitchen letrozole (FEMARA) 2.5 MG tablet Take 1 tablet (2.5 mg total) by mouth daily. 30 tablet 3    . lidocaine-prilocaine (EMLA) cream Apply to affected area once (Patient taking differently: Apply 1 application topically once as needed (port access). Apply to affected area once) 30 g 3  . losartan (COZAAR) 25 MG tablet Take 0.5 tablets (12.5 mg total) by mouth at bedtime. 15 tablet 3  . naproxen sodium (ANAPROX) 220 MG tablet Take 220 mg 2 (two) times daily as needed by mouth (pain).     Marland Kitchen venlafaxine XR (EFFEXOR XR) 150 MG 24 hr capsule Take 1 capsule (150 mg total) by mouth daily with breakfast. 30 capsule 3  . zolpidem (AMBIEN) 5 MG tablet Take 1 tablet (5 mg total) by mouth at bedtime as needed for sleep. 30 tablet 2  . diphenoxylate-atropine (LOMOTIL) 2.5-0.025 MG tablet Take 1-2 tablets by mouth 4 (four) times daily as needed for diarrhea or loose stools. (Patient not taking: Reported on 05/13/2018) 30 tablet 0  . loperamide (IMODIUM) 2 MG capsule Take 1 capsule (2 mg total) by mouth 3 (three) times daily as needed for diarrhea or loose stools. (Patient not taking: Reported on 05/13/2018) 30 capsule 0  . prochlorperazine (COMPAZINE) 10 MG tablet Take 1 tablet (10 mg total) by mouth every 6 (six) hours as needed (Nausea or vomiting). (Patient not taking: Reported on 05/13/2018) 30 tablet 1   Current Facility-Administered Medications  Medication Dose Route Frequency Provider Last  Rate Last Dose  . gi cocktail (Maalox,Lidocaine,Donnatal)  30 mL Oral Once Harle Stanford., PA-C        Physical Findings:  height is 5\' 6"  (1.676 m) and weight is 152 lb 3.2 oz (69 kg). Her oral temperature is 99 F (37.2 C). Her blood pressure is 97/68 and her pulse is 120 (abnormal). Her respiration is 16 and oxygen saturation is 98%.  Pain Assessment Pain Score: 0-No pain/10 In general this is a well appearing caucasian female in no acute distress. She's alert and oriented x4 and appropriate throughout the examination. Cardiopulmonary assessment is negative for acute distress and she exhibits normal effort. The  right chest wall was examined and reveals a well healed mastectomy scar line and mild hyperpigmentation persists. 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   Lab Findings: Lab Results  Component Value Date   WBC 1.5 (L) 04/10/2018   HGB 11.3 (L) 04/10/2018   HCT 33.3 (L) 04/10/2018   MCV 94.2 04/10/2018   PLT 152 04/10/2018     Radiographic Findings: No results found.  Impression/Plan: 1. Stage IB, [id number] triple positive, grade 2 invasive ductal carcinoma of the right breast. The patient has been doing well since completion of radiotherapy. We discussed that we would be happy to continue to follow her as needed, but she will also continue to follow up with Dr. Burr Medico in medical oncology. She was counseled on skin care as well as measures to avoid sun exposure to this area.  2. Survivorship. We discussed the importance of survivorship evaluation and she is not yet scheduled for this, but will be in the near future. She was also given the monthly calendar for access to resources offered within the cancer center. 3. Epistaxis. The patient will try aquaphor to her nares bilaterally and was given samples and coupons and continue her saline nasal spray as well. If her symptoms worsen, she will call back for an ENT evaluation.     Carola Rhine, PAC

## 2018-05-18 NOTE — Progress Notes (Signed)
Disability paperwork successfully faxed to [employer] at [phone]. Mailed copy to patient address on file.

## 2018-05-20 ENCOUNTER — Other Ambulatory Visit: Payer: Self-pay

## 2018-05-20 ENCOUNTER — Inpatient Hospital Stay: Payer: BLUE CROSS/BLUE SHIELD | Attending: Nurse Practitioner

## 2018-05-20 ENCOUNTER — Encounter: Payer: Self-pay | Admitting: Physical Therapy

## 2018-05-20 ENCOUNTER — Ambulatory Visit: Payer: BLUE CROSS/BLUE SHIELD | Attending: Hematology | Admitting: Physical Therapy

## 2018-05-20 VITALS — BP 93/63 | HR 114 | Temp 98.0°F | Resp 18

## 2018-05-20 DIAGNOSIS — M25531 Pain in right wrist: Secondary | ICD-10-CM

## 2018-05-20 DIAGNOSIS — M6281 Muscle weakness (generalized): Secondary | ICD-10-CM | POA: Insufficient documentation

## 2018-05-20 DIAGNOSIS — Z79811 Long term (current) use of aromatase inhibitors: Secondary | ICD-10-CM | POA: Insufficient documentation

## 2018-05-20 DIAGNOSIS — C773 Secondary and unspecified malignant neoplasm of axilla and upper limb lymph nodes: Secondary | ICD-10-CM | POA: Insufficient documentation

## 2018-05-20 DIAGNOSIS — Z483 Aftercare following surgery for neoplasm: Secondary | ICD-10-CM | POA: Insufficient documentation

## 2018-05-20 DIAGNOSIS — Z800 Family history of malignant neoplasm of digestive organs: Secondary | ICD-10-CM | POA: Insufficient documentation

## 2018-05-20 DIAGNOSIS — R293 Abnormal posture: Secondary | ICD-10-CM

## 2018-05-20 DIAGNOSIS — R42 Dizziness and giddiness: Secondary | ICD-10-CM | POA: Insufficient documentation

## 2018-05-20 DIAGNOSIS — G5600 Carpal tunnel syndrome, unspecified upper limb: Secondary | ICD-10-CM | POA: Diagnosis not present

## 2018-05-20 DIAGNOSIS — I951 Orthostatic hypotension: Secondary | ICD-10-CM | POA: Diagnosis not present

## 2018-05-20 DIAGNOSIS — M25611 Stiffness of right shoulder, not elsewhere classified: Secondary | ICD-10-CM | POA: Insufficient documentation

## 2018-05-20 DIAGNOSIS — G8929 Other chronic pain: Secondary | ICD-10-CM | POA: Diagnosis not present

## 2018-05-20 DIAGNOSIS — C50411 Malignant neoplasm of upper-outer quadrant of right female breast: Secondary | ICD-10-CM

## 2018-05-20 DIAGNOSIS — R600 Localized edema: Secondary | ICD-10-CM | POA: Insufficient documentation

## 2018-05-20 DIAGNOSIS — Z170 Estrogen receptor positive status [ER+]: Secondary | ICD-10-CM | POA: Insufficient documentation

## 2018-05-20 DIAGNOSIS — M25619 Stiffness of unspecified shoulder, not elsewhere classified: Secondary | ICD-10-CM | POA: Diagnosis not present

## 2018-05-20 DIAGNOSIS — M25612 Stiffness of left shoulder, not elsewhere classified: Secondary | ICD-10-CM | POA: Insufficient documentation

## 2018-05-20 DIAGNOSIS — Z9012 Acquired absence of left breast and nipple: Secondary | ICD-10-CM | POA: Diagnosis not present

## 2018-05-20 DIAGNOSIS — R5383 Other fatigue: Secondary | ICD-10-CM | POA: Diagnosis not present

## 2018-05-20 DIAGNOSIS — L599 Disorder of the skin and subcutaneous tissue related to radiation, unspecified: Secondary | ICD-10-CM | POA: Diagnosis not present

## 2018-05-20 DIAGNOSIS — K219 Gastro-esophageal reflux disease without esophagitis: Secondary | ICD-10-CM | POA: Insufficient documentation

## 2018-05-20 DIAGNOSIS — G4700 Insomnia, unspecified: Secondary | ICD-10-CM | POA: Insufficient documentation

## 2018-05-20 DIAGNOSIS — K5900 Constipation, unspecified: Secondary | ICD-10-CM | POA: Insufficient documentation

## 2018-05-20 DIAGNOSIS — R197 Diarrhea, unspecified: Secondary | ICD-10-CM | POA: Diagnosis not present

## 2018-05-20 DIAGNOSIS — Z95828 Presence of other vascular implants and grafts: Secondary | ICD-10-CM

## 2018-05-20 DIAGNOSIS — Z803 Family history of malignant neoplasm of breast: Secondary | ICD-10-CM | POA: Insufficient documentation

## 2018-05-20 DIAGNOSIS — Z79899 Other long term (current) drug therapy: Secondary | ICD-10-CM | POA: Insufficient documentation

## 2018-05-20 DIAGNOSIS — R232 Flushing: Secondary | ICD-10-CM | POA: Diagnosis not present

## 2018-05-20 MED ORDER — GOSERELIN ACETATE 3.6 MG ~~LOC~~ IMPL
3.6000 mg | DRUG_IMPLANT | Freq: Once | SUBCUTANEOUS | Status: AC
  Administered 2018-05-20: 3.6 mg via SUBCUTANEOUS

## 2018-05-20 MED ORDER — GOSERELIN ACETATE 3.6 MG ~~LOC~~ IMPL
DRUG_IMPLANT | SUBCUTANEOUS | Status: AC
Start: 2018-05-20 — End: ?
  Filled 2018-05-20: qty 3.6

## 2018-05-20 NOTE — Therapy (Signed)
[place], [place], [address] Phone: [phone]   Fax:  [phone]  Physical Therapy Evaluation  Patient Details  Name: Sheila [name] MRN: [id number] Date of Birth: [birth year] Referring Provider: Burr Medico   Encounter Date: 05/20/2018  PT End of Session - 05/20/18 1531    Visit Number  1    Number of Visits  9    Date for PT Re-Evaluation  06/17/18    PT Start Time  0354    PT Stop Time  1518    PT Time Calculation (min)  41 min    Activity Tolerance  Patient tolerated treatment well    Behavior During Therapy  [facility] for tasks assessed/performed       Past Medical History:  Diagnosis Date  . Anxiety   . Cancer [facility])    right breast   . Family history of breast cancer   . Family history of colon cancer   . GERD (gastroesophageal reflux disease)   . Headache    couple of migraines in the past  . History of kidney stones   . Vitamin D deficiency     Past Surgical History:  Procedure Laterality Date  . egg donation    . kidney stone removal    . MASTECTOMY W/ SENTINEL NODE BIOPSY Bilateral 07/22/2017   Procedure: RIGHT TOTAL MASTECTOMY WITH RIGHT AXILLARY SENTINEL LYMPH NODE BIOPSY, LEFT PROPHYLACTIC MASTECTOMY;  Surgeon: [name], MD;  Location: [facility];  Service: General;  Laterality: Bilateral;  . PORTACATH PLACEMENT Right 07/22/2017   Procedure: INSERTION PORT-A-CATH WITH [name];  Surgeon: [name], MD;  Location: [facility];  Service: General;  Laterality: Right;    There were no vitals filed for this visit.   Subjective Assessment - 05/20/18 1441    Subjective  I started having numbness in my right hand a couple of weeks ago. It happens when I am holding my phone and when I am laying down. It gets better when I straighten my elbow but it takes a while. Sometimes it feels like someone is squeezing my upper or lower arm. I haven't noticed any swelling. The last infusion I had was in  May 2019. I just finished radiation at the end of May. The last chemo was in January 2019.     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 It measures 5.5 cm and is located in the upper outer quadrant with a Ki67 of 12%. , Bilateral mastectomies on 07/22/17 with SLNB on R with 2 to 3 additional nodes taken, pt has to complete chemo and radiation and will begin chemo on Aug 29, 2017    Patient Stated Goals  to figure out why my hand is tingling    Currently in Pain?  No/denies    Pain Score  0-No pain         [facility] PT Assessment - 05/20/18 0001      Assessment   Medical Diagnosis  Right breast cancer    Referring Provider  Burr Medico    Onset Date/Surgical Date  07/22/17    Hand Dominance  Right    Prior Therapy  ROM from 10/18-12/18      Precautions   Precautions  Other (comment) at risk for lymphedema      Restrictions   Weight Bearing Restrictions  No      Balance Screen   Has the patient fallen in  the past 6 months  No    Has the patient had a decrease in activity level because of a fear of falling?   No    Is the patient reluctant to leave their home because of a fear of falling?   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    Vocation Requirements  --    Leisure  pt states she mostly does stretches for 30 min a day for whole body      Cognition   Overall Cognitive Status  Within Functional Limits  for tasks assessed      Posture/Postural Control   Posture/Postural Control  Postural limitations    Postural Limitations  Forward head;Rounded Shoulders      AROM   Right Shoulder Flexion  150 Degrees    Right Shoulder ABduction  179 Degrees    Right Shoulder Internal Rotation  72 Degrees    Right Shoulder External Rotation  90 Degrees    Left Shoulder Flexion  163 Degrees    Left Shoulder ABduction  179 Degrees    Left Shoulder Internal Rotation  73 Degrees    Left Shoulder External Rotation  90 Degrees      Strength   Overall Strength  --      Palpation   Palpation comment  tenderness over anterior wrist in area of median nerve, also 2 cords palpable in right axilla        LYMPHEDEMA/ONCOLOGY QUESTIONNAIRE - 05/20/18 1451      Type   Cancer Type  right breast cancer      Surgeries   Mastectomy Date  07/22/17 bilateral    Sentinel Lymph Node Biopsy Date  07/22/17    Number Lymph Nodes Removed  10      Date Lymphedema/Swelling Started   Date  07/22/17      Treatment   Active Chemotherapy Treatment  No pt is receiving infusions    Past Chemotherapy Treatment  No    Active Radiation Treatment  No will start after chemo    Past Radiation Treatment  Yes    Current Hormone Treatment  No will begin after completing radiation and chemotherapy    Past Hormone Therapy  No      What other symptoms do you have   Are you Having Heaviness or Tightness  Yes    Are you having Pain  No    Are you having pitting edema  No    Is it Hard or Difficult finding clothes that fit  No    Do you have infections  No    Is there Decreased scar mobility  Yes      Lymphedema Assessments   Lymphedema Assessments  Upper extremities      Right Upper Extremity Lymphedema   15 cm Proximal to Olecranon Process  30 cm    Olecranon Process  23.7 cm    15 cm Proximal to Ulnar Styloid Process  24 cm    Just Proximal to Ulnar Styloid Process  15.6 cm    Across Hand at [employer]  19 cm     At [place] of 2nd Digit  6.4 cm      Left Upper Extremity Lymphedema   15 cm Proximal to Olecranon Process  29.5 cm    Olecranon Process  24 cm    15 cm Proximal to Ulnar Styloid Process  23 cm    Just Proximal to Ulnar Styloid Process  15.5 cm    Across Hand at [employer]  19 cm    At [place] of 2nd Digit  6.4 cm             Objective measurements completed on examination: See above findings.      [place] Adult PT Treatment/Exercise - 05/20/18 0001      Exercises   Other Exercises   instructed pt in the following wrist stretches: median nerve stretch, fist/stop sign, fist/fingers outstretched, wrist flexion and extension using other hand to increase stretch and prayer position- pt returned therapist demo of all the above                  PT Long Term Goals - 05/20/18 1533      PT LONG TERM GOAL #1   Title  Pt will report a 75% improvement in numbness in right hand to allow improved comfort    Time  4    Period  Weeks    Status  New    Target Date  06/17/18      PT LONG TERM GOAL #2   Title  Pt will be independent in a home exercise program for continued strengthening and stretching to decrease right hand/wrist discomfort    Time  4    Period  Weeks    Status  New    Target Date  06/17/18      PT LONG TERM GOAL #3   Title  Pt will demonstrate no cording in right axilla to decrease tightness with right shoulder ROM    Baseline  2 cords palpable and visible with abduction    Time  4    Period  Weeks    Status  New    Target Date  06/17/18      Breast Clinic Goals - 06/18/17 1221      Patient will be able to verbalize understanding of pertinent lymphedema risk reduction practices relevant to her diagnosis specifically related to skin care.   Time  1    Period  Days    Status  Achieved      Patient will be able to return demonstrate and/or verbalize understanding of the post-op home exercise program related to regaining shoulder range of motion.    Time  1    Period  Days    Status  Achieved      Patient will be able to verbalize understanding of the importance of attending the postoperative After Breast Cancer Class for further lymphedema risk reduction education and therapeutic exercise.   Time  1    Period  Days    Status  Achieved       Long Term Clinic Goals - 02/26/18 1611      CC Long Term Goal  #1   Title  Pt to demonstrate 165 degrees of  bilateral shoulder flexion to allow her to reach up and overhead    Baseline  R 98, L 125, 08/25/17- R 122, L 141, 09/10/17- 132 on R, 148 on L, 09/29/17- R 135, L 156, 10/08/17- R 144, L 169; 10/22/17-Rt. 141    Status  Partially Met      CC Long Term Goal  #2   Title  Pt to demonstrate 165 degrees of bilateral shoulder abduction to allow pt to reach out to sides    Baseline  R 74, L 86, 08/25/17- R 92, L 141, 09/10/17- 112 on R, 144 on L, 09/29/17- R 151, 175, 10/08/17- R 175 L 178    Status  Achieved      CC Long Term Goal  #3   Title  Pt to demonstrate 70 degrees of right shoulder internal rotation to allow her to return to prior level of function    Baseline  48, 08/25/17- 51, 09/10/17- 71 degrees    Status  Achieved      CC Long Term Goal  #4   Title  Pt to report a 75% improvement in right trunk edema to allow improved comfort and sleep    Baseline  08/25/17- 60%, 09/10/17- 70%, 09/29/17- 73% improvement, 10/08/17- 73%    Status  Partially Met      CC Long Term Goal  #5   Title  Pt to be independent in a home exercise program for continued strengthening and stretching    Status  Partially Met          Plan - 05/20/18 1534    Clinical Impression Statement  Pt known to this clinic previously after receiving PT for shoulder ROM at the end of 2018. She recently began having right hand numbness that seems to be worse when she carries objects and uses her phone. She does have some tenderness over area of median nerve in flexor retinaculum. This sounds more consistent with carpal  tunnel than chemo induced peripherial neuropathy since it is only on right side and there is a point of tenderness in this area. Pt would benefit from skilled PT services to decrease cording in right axilla, decrease hand numbness and instruct pt in a home exercise program.     History and Personal Factors relevant to plan of care:  pt is right handed    Clinical Presentation  Evolving    Clinical Presentation due to:  pt still receiving infusions but had to stop temorarily due to heart problems    Clinical Decision Making  Moderate    Rehab Potential  Good    Clinical Impairments Affecting Rehab Potential  hx of radiation    PT Frequency  2x / week    PT Duration  4 weeks    PT Treatment/Interventions  ADLs/Self Care Home Management;Therapeutic activities;Therapeutic exercise;Patient/family education;Manual techniques;Scar mobilization;Passive range of motion;Taping;Iontophoresis 16m/ml Dexamethasone    PT Next Visit Plan  try ionto over wrist in area of pain, see if wrist stretches helped decrease pain, median nerve glides, continued stretching/strengthening for carpal tunnel, see if pt purchased night brace to sleep in     PT Home Exercise Plan  median nerve stretch, closed fist/outstretched fingers, closed fist/stop sign, flexion/extension of wrist, prayer position- get splint    Consulted and Agree with Plan of Care  Patient       Patient will benefit from skilled therapeutic intervention in order to improve the following deficits and impairments:  Pain, Impaired sensation, Increased fascial restricitons, Decreased  scar mobility, Postural dysfunction  Visit Diagnosis: Pain in right wrist  Abnormal posture  Disorder of the skin and subcutaneous tissue related to radiation, unspecified     Problem List Patient Active Problem List   Diagnosis Date Noted  . Multifocal pneumonia 12/31/2017  . Fever due to infection   . Hypoxia   . Influenza A 12/29/2017  . Lactic acidosis  12/28/2017  . Lung mass 12/28/2017  . Diarrhea 12/28/2017  . Sinus tachycardia 12/28/2017  . Sepsis ([place]) 09/04/2017  . Port-A-Cath in place 08/28/2017  . Genetic testing 06/25/2017  . Family history of breast cancer   . Family history of colon cancer   . Malignant neoplasm of upper-outer quadrant of right breast in female, estrogen receptor positive ([place]) 06/16/2017    Allyson Sabal [facility] 05/20/2018, 3:45 PM  [place], [place], [address] Phone: [phone]   Fax:  [phone]  Name: Sheila [name] MRN: [id number] Date of Birth: [birth year]  Manus Gunning, PT 05/20/18 3:46 PM

## 2018-05-20 NOTE — Patient Instructions (Signed)
1. Make a fist then raise fingers up like a stop sign do this 10 x  2. Make a fist then spread fingers out wide x 10 3. Rest arm on table with wrist hanging over edge, gently use other hand to flex and extend wrist and hold for several seconds x 10 4. Prayer position x 30 sec hold do this 3x  Do these in the morning, afternoon and then evening

## 2018-05-25 ENCOUNTER — Ambulatory Visit (HOSPITAL_COMMUNITY)
Admission: RE | Admit: 2018-05-25 | Discharge: 2018-05-25 | Disposition: A | Discharge status: Home / Self Care | Payer: BLUE CROSS/BLUE SHIELD | Source: Ambulatory Visit | Attending: Cardiology | Admitting: Cardiology

## 2018-05-25 ENCOUNTER — Ambulatory Visit (HOSPITAL_BASED_OUTPATIENT_CLINIC_OR_DEPARTMENT_OTHER)
Admission: RE | Admit: 2018-05-25 | Discharge: 2018-05-25 | Disposition: A | Discharge status: Home / Self Care | Payer: BLUE CROSS/BLUE SHIELD | Source: Ambulatory Visit | Attending: Cardiology | Admitting: Cardiology

## 2018-05-25 VITALS — BP 89/48 | HR 124 | Wt 151.1 lb

## 2018-05-25 DIAGNOSIS — C50911 Malignant neoplasm of unspecified site of right female breast: Secondary | ICD-10-CM | POA: Insufficient documentation

## 2018-05-25 DIAGNOSIS — Z803 Family history of malignant neoplasm of breast: Secondary | ICD-10-CM | POA: Diagnosis not present

## 2018-05-25 DIAGNOSIS — Z79899 Other long term (current) drug therapy: Secondary | ICD-10-CM | POA: Diagnosis not present

## 2018-05-25 DIAGNOSIS — Z9013 Acquired absence of bilateral breasts and nipples: Secondary | ICD-10-CM | POA: Diagnosis not present

## 2018-05-25 DIAGNOSIS — Z170 Estrogen receptor positive status [ER+]: Secondary | ICD-10-CM

## 2018-05-25 DIAGNOSIS — Z9221 Personal history of antineoplastic chemotherapy: Secondary | ICD-10-CM | POA: Diagnosis not present

## 2018-05-25 DIAGNOSIS — C50411 Malignant neoplasm of upper-outer quadrant of right female breast: Secondary | ICD-10-CM

## 2018-05-25 DIAGNOSIS — R000 Tachycardia, unspecified: Secondary | ICD-10-CM | POA: Insufficient documentation

## 2018-05-25 DIAGNOSIS — Z79811 Long term (current) use of aromatase inhibitors: Secondary | ICD-10-CM | POA: Diagnosis not present

## 2018-05-25 DIAGNOSIS — Z7951 Long term (current) use of inhaled steroids: Secondary | ICD-10-CM | POA: Insufficient documentation

## 2018-05-25 DIAGNOSIS — Z923 Personal history of irradiation: Secondary | ICD-10-CM | POA: Diagnosis not present

## 2018-05-25 NOTE — Patient Instructions (Signed)
Your physician has requested that you have an echocardiogram. Echocardiography is a painless test that uses sound waves to create images of your heart. It provides your doctor with information about the size and shape of your heart and how well your heart's chambers and valves are working. This procedure takes approximately one hour. There are no restrictions for this procedure.  Your physician recommends that you schedule a follow-up appointment in: 8 weeks with Dr. Aundra Dubin an a echocardiogram

## 2018-05-25 NOTE — Progress Notes (Signed)
  Echocardiogram 2D Echocardiogram has been performed.  Madelaine Etienne 05/25/2018, 1:56 PM

## 2018-05-26 NOTE — Progress Notes (Signed)
Oncology: Dr. Burr Medico  42 y.o. with history of breath cancer was referred by Dr. Burr Medico for cardio-oncology evaluation given planned Herceptin use.  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  She had bilateral mastectomies and axillary node biopsy in 7/18.  Planned for Taxol/carboplatin/Herceptin/Perjeta q 3 wks x 6 cycles starting 10/18, then maintenance Herceptin to complete a year.  She finished the initial chemotherapy and has finished radiation.   No history of cardiac problems.  Nonsmoker.   Echo in 12/18 showed significantly less negative strain though EF was preserved.  I had her stop Herceptin for a month and started Coreg 3.125 mg bid. Echo in 1/19 showed improved strain so Herceptin was restarted.  Echo in 5/19 showed fall in EF to 45-50% so Herceptin was held and she was started on Coreg and losartan. Today, echo shows that EF remains in the 45-50% range.   She returns for followup of cardiomyopathy. BP is low and HR high (sinus tachycardia).  However, during her echo, HR was in the 80s.  No dizziness/lightheadedness, no falls.  No exertional dyspnea. Some fatigue.  No chest pain.  No orthopnea/PND.   ECG (personally reviewed): Sinus tachy 126, right axis deviation, inferolateral TWIs  Labs (5/19): K 3.8, creatinine 0.84 => 0.94  PMH: 1. Breast cancer: Diagnosed 7/18 on right, ER+/PR+/HER2+.  She had bilateral mastectomies and axillary node biopsy in 7/18.  Planned for Taxol/carboplatin/Herceptin/Perjeta q 3 wks x 6 cycles starting 10/12, then maintenance Herceptin to complete a year.  - Echo (9/18): EF 14-43%, normal diastolic function, GLS -15.4%.  - Echo (12/18): EF 00-86%, normal diastolic function, normal RV size and systolic function, GLS -76.1%.  - Echo (1/19): EF 60%, GLS -15%, normal RV size and systolic function.  - Echo (5/19): EF 45-50%, GLS -17.2% - Echo (7/19): EF 45-50%, GLS -16%  Family History  Problem Relation Age of Onset  . Breast cancer Paternal  Grandmother 43  . Colon cancer Paternal Grandmother 19  . Breast cancer Mother 52  . Prostate cancer Paternal Grandfather        dx in his 62s  . Leukemia Paternal Grandfather   . Lung cancer Maternal Uncle        heavy smoker  . Other Paternal Uncle        farm accident  . Stroke Maternal Grandfather    Social History   Socioeconomic History  . Marital status: Single    Spouse name: Not on file  . Number of children: Not on file  . Years of education: Not on file  . Highest education level: Not on file  Occupational History  . Not on file  Social Needs  . Financial resource strain: Not on file  . Food insecurity:    Worry: Not on file    Inability: Not on file  . Transportation needs:    Medical: Not on file    Non-medical: Not on file  Tobacco Use  . Smoking status: Never Smoker  . Smokeless tobacco: Former Network engineer and Sexual Activity  . Alcohol use: Yes    Comment: once a year  . Drug use: No  . Sexual activity: Yes  Lifestyle  . Physical activity:    Days per week: Not on file    Minutes per session: Not on file  . Stress: Not on file  Relationships  . Social connections:    Talks on phone: Not on file    Gets together: Not on file  Attends religious service: Not on file    Active member of club or organization: Not on file    Attends meetings of clubs or organizations: Not on file    Relationship status: Not on file  . Intimate partner violence:    Fear of current or ex partner: Not on file    Emotionally abused: Not on file    Physically abused: Not on file    Forced sexual activity: Not on file  Other Topics Concern  . Not on file  Social History Narrative  . Not on file   ROS: All systems reviewed and negative except as per HPI.  Current Outpatient Medications  Medication Sig Dispense Refill  . carvedilol (COREG) 3.125 MG tablet Take 3.125 mg by mouth 2 (two) times daily with a meal.    . Cholecalciferol (VITAMIN D) 2000 units tablet  Take 2,000 Units by mouth daily.    . diphenoxylate-atropine (LOMOTIL) 2.5-0.025 MG tablet Take 1-2 tablets by mouth 4 (four) times daily as needed for diarrhea or loose stools. 30 tablet 0  . fluticasone (FLONASE) 50 MCG/ACT nasal spray Place 1 spray into both nostrils daily.    Marland Kitchen letrozole (FEMARA) 2.5 MG tablet Take 1 tablet (2.5 mg total) by mouth daily. 30 tablet 3  . lidocaine-prilocaine (EMLA) cream Apply to affected area once (Patient taking differently: Apply 1 application topically once as needed (port access). Apply to affected area once) 30 g 3  . losartan (COZAAR) 25 MG tablet Take 0.5 tablets (12.5 mg total) by mouth at bedtime. 15 tablet 3  . naproxen sodium (ANAPROX) 220 MG tablet Take 220 mg 2 (two) times daily as needed by mouth (pain).     Marland Kitchen venlafaxine XR (EFFEXOR XR) 150 MG 24 hr capsule Take 1 capsule (150 mg total) by mouth daily with breakfast. 30 capsule 3  . zolpidem (AMBIEN) 5 MG tablet Take 1 tablet (5 mg total) by mouth at bedtime as needed for sleep. 30 tablet 2  . loperamide (IMODIUM) 2 MG capsule Take 1 capsule (2 mg total) by mouth 3 (three) times daily as needed for diarrhea or loose stools. (Patient not taking: Reported on 05/13/2018) 30 capsule 0  . prochlorperazine (COMPAZINE) 10 MG tablet Take 1 tablet (10 mg total) by mouth every 6 (six) hours as needed (Nausea or vomiting). (Patient not taking: Reported on 05/13/2018) 30 tablet 1   Current Facility-Administered Medications  Medication Dose Route Frequency Provider Last Rate Last Dose  . gi cocktail (Maalox,Lidocaine,Donnatal)  30 mL Oral Once Tanner, Lucianne Lei E., PA-C       BP (!) 89/48   Pulse (!) 124   Wt 151 lb 1.9 oz (68.5 kg)   SpO2 99%   BMI 24.39 kg/m  General: NAD Neck: No JVD, no thyromegaly or thyroid nodule.  Lungs: Clear to auscultation bilaterally with normal respiratory effort. CV: Nondisplaced PMI.  Heart mildly tachy, regular S1/S2, no S3/S4, no murmur.  No peripheral edema.  No carotid bruit.   Normal pedal pulses.  Abdomen: Soft, nontender, no hepatosplenomegaly, no distention.  Skin: Intact without lesions or rashes.  Neurologic: Alert and oriented x 3.  Psych: Normal affect. Extremities: No clubbing or cyanosis.  HEENT: Normal.   Assessment/Plan:  1. Breast cancer: 1 year of Herceptin had been planned.  Her initial echo was normal. 12/18 echo showed that EF remained 60-65% but global longitudinal strain measurement was significantly less negative, down to -13.5% from -20.2%.  I had her stop Herceptin for a  month and she had a repeat echo in 1/19 with stable EF and more negative strain, Herceptin was restarted. In 5/19, EF was down to 45-50% so Herceptin held.  Repeat echo today was reviewed, showing EF still 45-50%.  I suspect this is related to Herceptin.  She is not volume overloaded on exam. BP is soft but she is asymptomatic.  - I would like her to stay off Herceptin for now. Will repeat echo again in 6 wks to look for improvement.  - No BP room to increase Coreg or losarrtan.  Continue current meds.    - Repeat echo in 6 wks to look for improvement.   2. Sinus tachycardia: This may be related to anxiety.  HR up in the office currently, but HR was in 80s during echo.    Followup in 6 wks with echo.   Loralie Champagne 05/26/2018

## 2018-05-27 ENCOUNTER — Encounter: Payer: Self-pay | Admitting: Physical Therapy

## 2018-05-27 ENCOUNTER — Ambulatory Visit: Payer: BLUE CROSS/BLUE SHIELD | Admitting: Physical Therapy

## 2018-05-27 DIAGNOSIS — M25612 Stiffness of left shoulder, not elsewhere classified: Secondary | ICD-10-CM | POA: Diagnosis not present

## 2018-05-27 DIAGNOSIS — R293 Abnormal posture: Secondary | ICD-10-CM | POA: Diagnosis not present

## 2018-05-27 DIAGNOSIS — M25531 Pain in right wrist: Secondary | ICD-10-CM | POA: Diagnosis not present

## 2018-05-27 DIAGNOSIS — M25611 Stiffness of right shoulder, not elsewhere classified: Secondary | ICD-10-CM

## 2018-05-27 DIAGNOSIS — L599 Disorder of the skin and subcutaneous tissue related to radiation, unspecified: Secondary | ICD-10-CM

## 2018-05-27 DIAGNOSIS — R600 Localized edema: Secondary | ICD-10-CM | POA: Diagnosis not present

## 2018-05-27 DIAGNOSIS — M6281 Muscle weakness (generalized): Secondary | ICD-10-CM

## 2018-05-27 DIAGNOSIS — Z483 Aftercare following surgery for neoplasm: Secondary | ICD-10-CM | POA: Diagnosis not present

## 2018-05-27 DIAGNOSIS — C50411 Malignant neoplasm of upper-outer quadrant of right female breast: Secondary | ICD-10-CM | POA: Diagnosis not present

## 2018-05-27 DIAGNOSIS — Z170 Estrogen receptor positive status [ER+]: Secondary | ICD-10-CM | POA: Diagnosis not present

## 2018-05-27 NOTE — Therapy (Signed)
[place], [place], [address] Phone: [phone]   Fax:  [phone]  Physical Therapy Treatment  Patient Details  Name: Sheila [name] MRN: [id number] Date of Birth: May 29, 1976 Referring Provider: Burr Medico   Encounter Date: 05/27/2018  PT End of Session - 05/27/18 1055    Visit Number  2    Number of Visits  9    Date for PT Re-Evaluation  06/17/18    PT Start Time  1055    PT Stop Time  1135    PT Time Calculation (min)  40 min    Activity Tolerance  Patient tolerated treatment well    Behavior During Therapy  [facility] for tasks assessed/performed       Past Medical History:  Diagnosis Date  . Anxiety   . Cancer [facility])    right breast   . Family history of breast cancer   . Family history of colon cancer   . GERD (gastroesophageal reflux disease)   . Headache    couple of migraines in the past  . History of kidney stones   . Vitamin D deficiency     Past Surgical History:  Procedure Laterality Date  . egg donation    . kidney stone removal    . MASTECTOMY W/ SENTINEL NODE BIOPSY Bilateral 07/22/2017   Procedure: RIGHT TOTAL MASTECTOMY WITH RIGHT AXILLARY SENTINEL LYMPH NODE BIOPSY, LEFT PROPHYLACTIC MASTECTOMY;  Surgeon: [name], MD;  Location: [place];  Service: General;  Laterality: Bilateral;  . PORTACATH PLACEMENT Right 07/22/2017   Procedure: INSERTION PORT-A-CATH WITH [name];  Surgeon: [name], MD;  Location: [place];  Service: General;  Laterality: Right;    There were no vitals filed for this visit.  Subjective Assessment - 05/27/18 1056    Subjective  A little improvement noted. Less frequent symptoms. Brace is helping.     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 It measures 5.5 cm and is located in the upper outer quadrant with a Ki67 of 12%. , Bilateral mastectomies on 07/22/17 with  SLNB on R with 2 to 3 additional nodes taken, pt has to complete chemo and radiation and will begin chemo on Aug 29, 2017    Currently in Pain?  No/denies    Multiple Pain Sites  No                       [facility] Adult PT Treatment/Exercise - 05/27/18 0001      Self-Care   Self-Care  Other Self-Care Comments    Other Self-Care Comments   Body mechanics to reduce strain on her RT wrist/forearm/elbow      Wrist Exercises   Other wrist exercises  Static stretches wrist flex/ext 3x20 sec holds VC to connect her scapula into her midback better.     Other wrist exercises  Prayer stretch 3x 20 sec      Iontophoresis   Type of Iontophoresis  Dexamethasone #1 skin intact    Location  Rt anterior wrist point of tenderness    Dose  1 ml    Time  6 hr wear, pt educated on wear time verbally and precautions. Pt verbally understood.      Manual Therapy   Soft tissue mobilization  RT forearm extensor mass and flexor muscles, bicep             PT Education -  05/27/18 1119    Education Details  Home self myofascial/soft tissue release work with firm ball    Person(s) Educated  Patient    Methods  Explanation;Demonstration;Tactile cues;Verbal cues    Comprehension  Returned demonstration;Verbalized understanding          PT Long Term Goals - 05/20/18 1533      PT LONG TERM GOAL #1   Title  Pt will report a 75% improvement in numbness in right hand to allow improved comfort    Time  4    Period  Weeks    Status  New    Target Date  06/17/18      PT LONG TERM GOAL #2   Title  Pt will be independent in a home exercise program for continued strengthening and stretching to decrease right hand/wrist discomfort    Time  4    Period  Weeks    Status  New    Target Date  06/17/18      PT LONG TERM GOAL #3   Title  Pt will demonstrate no cording in right axilla to decrease tightness with right shoulder ROM    Baseline  2 cords palpable and visible with abduction    Time   4    Period  Weeks    Status  New    Target Date  06/17/18            Plan - 05/27/18 1055    Clinical Impression Statement  Pt presents today with reports of her symtpoms lessening in frequency and some in intensity. She is independent and compliant with her initial HEP and feels the stretches and using the brace have been most helpful. Pt was educated in ways to use better body mechanics regarding her RTUE to reduce strain to the [place] most notably not playing games on he rphone for long periods of time. Pt verbally understood.  Soft tissues of both flexor and extensor groups were thick, manual worked really softened that up. Began ionto treatments today for anterior wrist.     Rehab Potential  Good    Clinical Impairments Affecting Rehab Potential  hx of radiation    PT Frequency  2x / week    PT Duration  4 weeks    PT Treatment/Interventions  ADLs/Self Care Home Management;Therapeutic activities;Therapeutic exercise;Patient/family education;Manual techniques;Scar mobilization;Passive range of motion;Taping;Iontophoresis 51m/ml Dexamethasone    PT Next Visit Plan  See how pt tolerated ionto, if ok do #2. Manual work to Rt forearm, check goals. Work on cording in RT axilla.    PT Home Exercise Plan  median nerve stretch, closed fist/outstretched fingers, closed fist/stop sign, flexion/extension of wrist, prayer position- get splint    Consulted and Agree with Plan of Care  Patient       Patient will benefit from skilled therapeutic intervention in order to improve the following deficits and impairments:  Pain, Impaired sensation, Increased fascial restricitons, Decreased scar mobility, Postural dysfunction  Visit Diagnosis: Pain in right wrist  Abnormal posture  Disorder of the skin and subcutaneous tissue related to radiation, unspecified  Stiffness of right shoulder, not elsewhere classified  Muscle weakness (generalized)     Problem List Patient Active Problem List    Diagnosis Date Noted  . Multifocal pneumonia 12/31/2017  . Fever due to infection   . Hypoxia   . Influenza A 12/29/2017  . Lactic acidosis 12/28/2017  . Lung mass 12/28/2017  . Diarrhea 12/28/2017  . Sinus tachycardia 12/28/2017  .  Sepsis ([place]) 09/04/2017  . Port-A-Cath in place 08/28/2017  . Genetic testing 06/25/2017  . Family history of breast cancer   . Family history of colon cancer   . Malignant neoplasm of upper-outer quadrant of right breast in female, estrogen receptor positive ([place]) 06/16/2017    Sheila [name], PTA 05/27/2018, 11:47 AM  [place], [place], [address] Phone: [phone]   Fax:  [phone]  Name: Sheila [name] MRN: [id number] Date of Birth: Jun 23, 1976

## 2018-05-27 NOTE — Patient Instructions (Signed)

## 2018-05-28 NOTE — Progress Notes (Signed)
[place]  [phone] [phone]  Clinic Follow up Note   Patient Care Team: [name], MD as PCP - General (Family Medicine) [name] (Family Medicine) [name], MD as Consulting Physician (General Surgery) [name], MD as Consulting Physician (Hematology) Kyung Rudd, MD as Consulting Physician (Radiation Oncology) 05/29/2018  SUMMARY OF ONCOLOGIC HISTORY: Oncology History    Cancer Staging Malignant neoplasm of upper-outer quadrant of right breast in female, estrogen receptor positive ([place]) Staging form: Breast, AJCC 8th Edition - Clinical stage from 06/11/2017: Stage IB (cT3, cN0, cM0, G2, ER: Positive, PR: Positive, HER2: Positive) - Signed by [name], MD on 06/16/2017 - Pathologic stage from 07/22/2017: Stage IB (pT3(m), pN2a(sn), cM0, G2, ER: Positive, PR: Positive, HER2: Positive) - Signed by [name], NP on 08/04/2017      Malignant neoplasm of upper-outer quadrant of right breast in female, estrogen receptor positive ([place])   06/11/2017 Initial Biopsy    Diagnosis 06/11/17 Breast, right, needle core biopsy, 11:00 o'clock - INVASIVE DUCTAL CARCINOMA, G2      06/11/2017 Mammogram    [name] and MM Diagnostic Breast Tomo Bilateral 06/11/17 IMPRESSION: 1. Highly suspicious mass within the retroareolar right breast, extending from the 8:00 to 11:00 axes, anterior to posterior depth, extending anteriorly to the nipple with associated nipple retraction, measuring at least 5.5 cm by ultrasound  2. No sonographic evidence of metastatic lymphadenopathy in the right axilla. 3. No evidence of malignancy within the left breast.       06/11/2017 Initial Diagnosis    Malignant neoplasm of upper-outer quadrant of right breast in female, estrogen receptor positive ([place])      06/11/2017 Receptors her2    Estrogen Receptor: 100%, POSITIVE, STRONG STAINING INTENSITY Progesterone Receptor: 100%, POSITIVE, STRONG STAINING  INTENSITY Proliferation Marker Ki67: 12%Proliferation Marker Ki67: 12%  HER2 - **POSITIVE** RATIO OF HER2/CEP17 SIGNALS 2.55 AVERAGE HER2 COPY NUMBER PER CELL 4.85      06/25/2017 Genetic Testing    Negative genetic testing on the 9 gene STAT panel.  The STAT Breast cancer panel offered by [facility] includes sequencing and rearrangement analysis for the following 9 genes:  ATM, BRCA1, BRCA2, CDH1, CHEK2, PALB2, PTEN, STK11 and TP53.   The report date is June 25, 2017.  Negative genetic testing on the common hereditary cancer panel.  The Hereditary Gene Panel offered by [facility] includes sequencing and/or deletion duplication testing of the following 46 genes: APC, ATM, AXIN2, BARD1, BMPR1A, BRCA1, BRCA2, BRIP1, CDH1, CDKN2A (p14ARF), CDKN2A (p16INK4a), CHEK2, CTNNA1, DICER1, EPCAM (Deletion/duplication testing only), GREM1 (promoter region deletion/duplication testing only), KIT, MEN1, MLH1, MSH2, MSH3, MSH6, MUTYH, NBN, NF1, NHTL1, PALB2, PDGFRA, PMS2, POLD1, POLE, PTEN, RAD50, RAD51C, RAD51D, SDHB, SDHC, SDHD, SMAD4, SMARCA4. STK11, TP53, TSC1, TSC2, and VHL.  The following genes were evaluated for sequence changes only: SDHA and HOXB13 c.251G>A variant only.  The report date is June 25, 2017.       07/02/2017 Imaging    CT cAP 07/02/17 IMPRESSION: 1. Subareolar right breast mass. No compelling findings of nodal or metastatic involvement. 2. Old granulomatous disease. 3. There is a 7 mm enhancing or hyperdense focus posteriorly in segment 7 of the liver in the subcapsular region. By virtue of its small size this lesion is technically nonspecific although statistically likely to be a small benign lesions such as flash filling hemangioma. This may warrant surveillance. 4.  Prominent stool throughout the colon favors constipation. 5. 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      07/02/2017 Imaging    Bone scan 07/02/17 IMPRESSION: Today' s exam is  negative. I ascribed the tiny focus of activity just proximal to the left antecubital region to injection site.      07/22/2017 Surgery    RIGHT TOTAL MASTECTOMY WITH RIGHT AXILLARY SENTINEL LYMPH NODE BIOPSY, LEFT PROPHYLACTIC MASTECTOM and INSERTION PORT-A-CATH WITH [name] by [name] and [facility]       07/22/2017 Pathology Results    Diagnosis 1. Breast, simple mastectomy, Left - FIBROADENOMA. - NO MALIGNANCY IDENTIFIED. 2. 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 - ADDITIONAL FOCUS OF LOBULAR CARCINOMA, GRADE 2, SPANNING 2.2 CM. - INTERMEDIATE GRADE DUCTAL CARCINOMA IN SITU. - INVASIVE CARCINOMA COMES TO WITHIN 0.2 TO 0.3 CM OF THE DEEP MARGIN, FOCALLY. - TUMOR FOCALLY INVOLVES EPIDERMIS. - LYMPHOVASCULAR INVASION OF DERMAL LYMPHATICS. - ONE OF ONE LYMPH NODES NEGATIVE FOR CARCINOMA (0/1). - SEE ONCOLOGY TABLE. 3. Lymph node, sentinel, biopsy, Right axillary - ONE LYMPH NODE WITH ISOLATED TUMOR CELLS (0/1). 4. Lymph node, sentinel, biopsy, Right - METASTATIC CARCINOMA IN ONE OF ONE LYMPH NODES (1/1). 5. Lymph node, sentinel, biopsy, Right - ONE OF ONE LYMPH NODES NEGATIVE FOR CARCINOMA (0/1). 6. Lymph node, sentinel, biopsy, Right - METASTATIC CARCINOMA IN ONE OF ONE LYMPH NODES (1/1). - EXTRACAPSULAR EXTENSION. 7. Lymph node, sentinel, biopsy, Right - METASTATIC CARCINOMA IN ONE OF ONE LYMPH NODES (1/1). - EXTRACAPSULAR EXTENSION. 8. Lymph node, sentinel, biopsy, Right - ONE OF ONE LYMPH NODES NEGATIVE FOR CARCINOMA (0/1). 9. Lymph node, sentinel, biopsy, Right - ONE OF ONE LYMPH NODES NEGATIVE FOR CARCINOMA (0/1). 10. Lymph node, sentinel, biopsy, Right - METASTATIC CARCINOMA IN ONE OF ONE LYMPH NODES (1/1). - EXTRACAPSULAR EXTENSION. 11. Lymph node, biopsy, Right axillary - ONE OF ONE LYMPH NODES NEGATIVE FOR CARCINOMA (0/1).       07/22/2017 Receptors her2    Ductal carcinoma in primary breast tumor  and node metastasis are ER 95%, PR 95% strongly positive, and HER2 + The lobular component in primary breast tumor is ER 95% positive, PR 95% positive, strong staining, HER-2 negative.   Original biopsy [id number]: Ductal morphology, Her2 FISH positive. Current specimen: Main focus is 75% ductal morphology, 25% lobular morphology. Smaller focus is lobular morphology. #2A Lobular morphology, Her IHC negative. #2E Lobular morphology, Her2 FISH negative, Her2 IHC negative. #4 50% lobular and 50% ductal morphology, Her2 FISH positive, Her2 IHC negative. #6 Ductal morphology #7 Ductal morphology #10 50% lobular, 50% ductal morphology      08/25/2017 Imaging    MRI abdomen done at [facility]:  Enhancing structure measing 5 mm is compatible with a flash filling hemangioma. No specific findings to suggest metastatic cancer      08/25/2017 Imaging    MRI Abdomen at [facility] 08/25/17  IMPRESSION: 1. Lesion in segment 7 of the liver has signal and enhancement characteristics compatible with a flash filling hemangioma. No specific findings identified to suggest metastatic disease.      08/29/2017 - 12/12/2017 Chemotherapy    adjuvant TCHP every 3 weeks for 6 cycles starting 08/29/17, followed by maintenance Herceptin with or without Perjeta for 6-12 months. Postponed and decreased Carbo to 643m and added Onpro with cycle 2 on 09/19/17 due to neutropenic fever. Carbplatin further reduced to 5057mstarting with cycle 3 due to thrombocytopenia. Herceptin was held for Cycle 4 and 5 due to worsening global longitudinal strain found on ECHO  from 11/07/17.          09/04/2017 - 09/09/2017 Hospital Admission    Admit date: 09/04/2017 Discharge date: 09/09/2017  DISCHARGE DIAGNOSES:  Principal Problem:   Sepsis [facility]) Active Problems:   Malignant neoplasm of upper-outer quadrant of right breast in female, estrogen receptor positive ([place])   Port-A-Cath in place   Neutropenic fever ([place])    Hyponatremia   Malignant neoplasm of breast in female, estrogen receptor positive ([place])       12/28/2017 Imaging    CT CHEST IMPRESSION: 1. 2.3 x 2.7 x 1.2 cm irregular masslike opacity within the right middle lobe worrisome for malignancy/metastasis. 2. 3 new very small sclerotic lesions, within the sternum and the body of T4, worrisome for new metastases.      12/28/2017 - 01/05/2018 Hospital Admission    Admit date: 12/28/17-01/05/18 Admission diagnosis: multifocal pneumonia, influenza A, sepsis, hypoxia Additional comments: imaging during hospitalization notable for new irregular masslike opacity in the right middle lobe worrisome for metastasis and new very small sclerotic lesion within the sternum and the body of T4, worrisome for metastasis      12/29/2017 Imaging    CT ABD/PELVIS IMPRESSION: No definite acute intra-abdominal or intrapelvic abnormalities.  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      12/30/2017 Imaging    CT HEAD IMPRESSION: No acute intracranial abnormality or enhancing lesion.      02/04/2018 PET scan    IMPRESSION: 1. The previous consolidation in the right middle lobe currently has a more nodular and reticular appearance with some associated mild atelectasis along its anterior margin. This demonstrates low-grade metabolic activity with maximum SUV 3.2 (background blood pool activity is 2.7). Although conceivably related to low-grade residuum from the patient's malignancy, the presence of calcified right hilar lymph nodes and small calcifications along the nodularity raise suspicion for a low-grade active granulomatous process or atypical infectious process. Surveillance is recommended. 2. Scattered deposits of hypermetabolic but benign metabolically active brown fat.      04/06/2018 Imaging    CT Chest wo contrast IMPRESSION: 1. Stable linear nodular thickening in the RIGHT middle lobe is favored post  infectious or inflammatory scarring. 2. Calcified RIGHT hilar lymph node is consistent post infectious process. 3. Stable small LEFT upper lobe nodule. 4. Stable sclerotic lesion in the manubrium. Sclerotic lesion at T3 is more prominent (4 mm). Sclerotic lesion at L 1 is new from 07/02/2017.      04/10/2018 Echocardiogram    LV EF 45%-50%      05/25/2018 Echocardiogram    LV EF 45%-50%     CURRENT THERAPY:  Herceptin and Perjeta maintenance therapy every 3 weeks; on hold since 03/20/18 for reduced EF; began letrozole 05/03/18   INTERVAL HISTORY: Ms. Schwarz returns for follow up and evaluation to resume herceptin/perjeta. She had repeat echo and f/u with Dr. Aundra Dubin on 05/25/18. She began letrozole since last f/u on 05/03/18. She has mild shoulder stiffness and chronic pain related to carpal tunnel, but no other joint/bone pain. Appetite is normal, eating and drinking well. She is easily fatigued, rests during ADLs. Fluctuates between constipation and diarrhea that she regulates with diet and apple juice. She is sleeping well with 0.5 tab ambien. She has mild, intermittent hot flashes, but overall less temperature fluctuation. Skin darkening from radiation is improving. She is occasionally lightheaded/dizzy on position changes; occurring more often if she is more active. Denies cough, chest pain, dyspnea, fever, or chills.  REVIEW OF SYSTEMS:   Constitutional: Denies fevers, chills or abnormal weight loss (+) easily fatigued  Eyes: Denies blurriness of vision Ears, nose, mouth, throat, and face: Denies mucositis or sore throat (+) dry nares  Respiratory: Denies cough, dyspnea or wheezes Cardiovascular: Denies palpitation, chest discomfort or lower extremity swelling Gastrointestinal:  Denies nausea, vomiting, heartburn or change in bowel habits (+) fluctuates b/w constipation and diarrhea  GU/GYN: Denies vaginal bleeding  Skin: Denies abnormal skin rashes (+) skin darkening to right chest,  improving  Lymphatics: Denies new lymphadenopathy or easy bruising Neurological:Denies numbness, tingling or new weaknesses (+) intermittent postural dizziness (+) mild intermittent hot flash Behavioral/Psych: Mood is stable, no new changes  MSK: (+) carpal tunnel, right (+) shoulder stiffness   All other systems were reviewed with the patient and are negative.  MEDICAL HISTORY:  Past Medical History:  Diagnosis Date  . Anxiety   . Cancer [facility])    right breast   . Family history of breast cancer   . Family history of colon cancer   . GERD (gastroesophageal reflux disease)   . Headache    couple of migraines in the past  . History of kidney stones   . Vitamin D deficiency     SURGICAL HISTORY: Past Surgical History:  Procedure Laterality Date  . egg donation    . kidney stone removal    . MASTECTOMY W/ SENTINEL NODE BIOPSY Bilateral 07/22/2017   Procedure: RIGHT TOTAL MASTECTOMY WITH RIGHT AXILLARY SENTINEL LYMPH NODE BIOPSY, LEFT PROPHYLACTIC MASTECTOMY;  Surgeon: [name], MD;  Location: [place];  Service: General;  Laterality: Bilateral;  . PORTACATH PLACEMENT Right 07/22/2017   Procedure: INSERTION PORT-A-CATH WITH [name];  Surgeon: [name], MD;  Location: [place];  Service: General;  Laterality: Right;    I have reviewed the social history and family history with the patient and they are unchanged from previous note.  ALLERGIES:  has No Known Allergies.  MEDICATIONS:  Current Outpatient Medications  Medication Sig Dispense Refill  . carvedilol (COREG) 3.125 MG tablet Take 3.125 mg by mouth 2 (two) times daily with a meal.    . Cholecalciferol (VITAMIN D) 2000 units tablet Take 2,000 Units by mouth daily.    . diphenoxylate-atropine (LOMOTIL) 2.5-0.025 MG tablet Take 1-2 tablets by mouth 4 (four) times daily as needed for diarrhea or loose stools. 30 tablet 0  . fluticasone (FLONASE) 50 MCG/ACT nasal spray Place 1 spray into both nostrils daily.    Marland Kitchen letrozole  (FEMARA) 2.5 MG tablet Take 1 tablet (2.5 mg total) by mouth daily. 30 tablet 3  . lidocaine-prilocaine (EMLA) cream Apply to affected area once (Patient taking differently: Apply 1 application topically once as needed (port access). Apply to affected area once) 30 g 3  . losartan (COZAAR) 25 MG tablet Take 0.5 tablets (12.5 mg total) by mouth at bedtime. 15 tablet 3  . naproxen sodium (ANAPROX) 220 MG tablet Take 220 mg 2 (two) times daily as needed by mouth (pain).     Marland Kitchen venlafaxine XR (EFFEXOR XR) 150 MG 24 hr capsule Take 1 capsule (150 mg total) by mouth daily with breakfast. 30 capsule 3  . zolpidem (AMBIEN) 5 MG tablet Take 1 tablet (5 mg total) by mouth at bedtime as needed for sleep. 30 tablet 2  . prochlorperazine (COMPAZINE) 10 MG tablet Take 1 tablet (10 mg total) by mouth every 6 (six) hours as needed (Nausea or vomiting). (Patient not taking: Reported on 05/13/2018) 30 tablet 1  Current Facility-Administered Medications  Medication Dose Route Frequency Provider Last Rate Last Dose  . gi cocktail (Maalox,Lidocaine,Donnatal)  30 mL Oral Once Harle Stanford., PA-C        PHYSICAL EXAMINATION: ECOG PERFORMANCE STATUS: 2 - Symptomatic, <50% confined to bed  Vitals:   05/29/18 1251 05/29/18 1257  BP: (!) 88/70 92/60  Pulse: 99   Resp: 18   Temp: 98.7 F (37.1 C)   SpO2: 100%    Filed Weights   05/29/18 1251  Weight: 153 lb 3.2 oz (69.5 kg)    GENERAL:alert, no distress and comfortable SKIN: no rashes or significant lesions EYES: normal, Conjunctiva are pink and non-injected, sclera clear OROPHARYNX:no thrush or ulcers  LYMPH:  no palpable cervical, supraclavicular, or axillary lymphadenopathy LUNGS: clear to auscultation with normal breathing effort HEART: regular rate & rhythm and no murmurs and no lower extremity edema ABDOMEN:abdomen soft, non-tender and normal bowel sounds Musculoskeletal:no cyanosis of digits and no clubbing  NEURO: alert & oriented x 3 with fluent  speech, no focal motor/sensory deficits Breast exam reveals bilateral mastectomy; incisions are well healed. Right chest hyperpigmentation secondary to radiation  PAC without erythema   LABORATORY DATA:  I have reviewed the data as listed CBC Latest Ref Rng & Units 05/29/2018 04/10/2018 03/20/2018  WBC 3.9 - 10.3 K/uL 4.6 1.5(L) 2.2(L)  Hemoglobin 11.6 - 15.9 g/dL 11.6 11.3(L) 12.0  Hematocrit 34.8 - 46.6 % 34.1(L) 33.3(L) 35.0  Platelets 145 - 400 K/uL 187 152 143(L)     CMP Latest Ref Rng & Units 05/29/2018 04/10/2018 03/20/2018  Glucose 70 - 99 mg/dL 93 132 93  BUN 6 - 20 mg/dL _0 Creatinine 0.44 - 1.00 mg/dL 1.02(H) 0.94 0.84  Sodium 135 - 145 mmol/L 139 140 138  Potassium 3.5 - 5.1 mmol/L 4.0 3.4(L) 3.8  Chloride 98 - 111 mmol/L 104 108 108  CO2 22 - 32 mmol/L _1 Calcium 8.9 - 10.3 mg/dL 9.8 9.2 9.6  Total Protein 6.5 - 8.1 g/dL 7.1 6.7 6.9  Total Bilirubin 0.3 - 1.2 mg/dL 0.4 0.6 0.8  Alkaline Phos 38 - 126 U/L 109 78 78  AST 15 - 41 U/L 13(L) 16 16  ALT 0 - 44 U/L _2 PATHOLOGY  Diagnosis 07/22/17 1. Breast, simple mastectomy, Left - FIBROADENOMA. - NO MALIGNANCY IDENTIFIED. 2. 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 - ADDITIONAL FOCUS OF LOBULAR CARCINOMA, GRADE 2, SPANNING 2.2 CM. - INTERMEDIATE GRADE DUCTAL CARCINOMA IN SITU. - INVASIVE CARCINOMA COMES TO WITHIN 0.2 TO 0.3 CM OF THE DEEP MARGIN, FOCALLY. - TUMOR FOCALLY INVOLVES EPIDERMIS. - LYMPHOVASCULAR INVASION OF DERMAL LYMPHATICS. - ONE OF ONE LYMPH NODES NEGATIVE FOR CARCINOMA (0/1). - SEE ONCOLOGY TABLE. 3. Lymph node, sentinel, biopsy, Right axillary - ONE LYMPH NODE WITH ISOLATED TUMOR CELLS (0/1). 4. Lymph node, sentinel, biopsy, Right - METASTATIC CARCINOMA IN ONE OF ONE LYMPH NODES (1/1). 5. Lymph node, sentinel, biopsy, Right - ONE OF ONE LYMPH NODES NEGATIVE FOR CARCINOMA (0/1). 6. Lymph node, sentinel, biopsy,  Right - METASTATIC CARCINOMA IN ONE OF ONE LYMPH NODES (1/1). - EXTRACAPSULAR EXTENSION. 7. Lymph node, sentinel, biopsy, Right - METASTATIC CARCINOMA IN ONE OF ONE LYMPH NODES (1/1). - EXTRACAPSULAR EXTENSION. 8. Lymph node, sentinel, biopsy, Right - ONE OF ONE LYMPH NODES NEGATIVE FOR CARCINOMA (0/1). 9. Lymph node, sentinel, biopsy, Right - ONE OF ONE LYMPH NODES NEGATIVE FOR CARCINOMA (  0/1). 10. Lymph node, sentinel, biopsy, Right - METASTATIC CARCINOMA IN ONE OF ONE LYMPH NODES (1/1). - EXTRACAPSULAR EXTENSION. 11. Lymph node, biopsy, Right axillary - ONE OF ONE LYMPH NODES NEGATIVE FOR CARCINOMA (0/1). Microscopic Comment 2. BREAST, INVASIVE TUMOR Microscopic Comment(continued) Procedure: Bilateral simple mastectomies with right axillary sentinel lymph node biopsies. Laterality: Right. Tumor Size: 5.3 cm. Histologic Type: Mixed lobular and ductal carcinoma. Grade: 2 Tubular Differentiation: 2 Nuclear Pleomorphism: 3 Mitotic Count: 1 Ductal Carcinoma in Situ (DCIS): Present, intermediate grade. Extent of Tumor: Involves epidermis of nipple focally. Margins: Invasive carcinoma, distance from closest margin: 0.2-0.3 cm of posterior margin (smaller mass). DCIS, distance from closest margin: >0.5 cm all margins Regional Lymph Nodes: Number of Lymph Nodes Examined: 10 Number of Sentinel Lymph Nodes Examined: 9 Lymph Nodes with Macrometastases: 4 Lymph Nodes with Micrometastases: 0 Lymph Nodes with Isolated Tumor Cells: 1 Breast Prognostic Profile: Performed on biopsy ([id number]), see below. Will be performed on additional lobular focus. Estrogen Receptor: Positive, 100% strong staining. Progesterone Receptor: Positive, 100% strong staining. Her2: Positive (ratio 2.55). Ki-67: 12%. Best tumor block for sendout testing: 2B (larger focus), 2E (smaller focus). Pathologic Stage Classification (pTNM, AJCC 8th Edition): Primary Tumor (pT): mpT3 Regional Lymph Nodes (pN):  pN2a Distant Metastases (pM): pMX  2. FLUORESCENCE IN-SITU HYBRIDIZATION Results: HER2 - NEGATIVE RATIO OF HER2/CEP17 SIGNALS 1.71 AVERAGE HER2 COPY NUMBER PER CELL 1.80 2. PROGNOSTIC INDICATORS Results: IMMUNOHISTOCHEMICAL AND MORPHOMETRIC ANALYSIS PERFORMED MANUALLY Estrogen Receptor: 95%, POSITIVE, STRONG STAINING INTENSITY Progesterone Receptor: 95%, POSITIVE, STRONG STAINING INTENSITY Proliferation Marker Ki67: 10%  4. FLUORESCENCE IN-SITU HYBRIDIZATION Results: HER2 - **POSITIVE**. OF NOTE, A TOTAL OF 40 TUMOR CELLS WERE EVALUATED FOR HER2 EXPRESSION. RATIO OF HER2/CEP17 SIGNALS 2.01 AVERAGE HER2 COPY NUMBER PER CELL 3.63  Estrogen Receptor: 95%, POSITIVE, STRONG STAINING INTENSITY Progesterone Receptor: 95%, POSITIVE, STRONG STAINING INTENSITY  Addendum 1. By immunohistochemistry, the tumor cells in part 2 are Negative for Her2 (1+).  2. The current prognostic profile was performed on the additional focus of pure lobular carcinoma.  Diagnosis 06/11/17 Breast, right, needle core biopsy, 11:00 o'clock - INVASIVE DUCTAL CARCINOMA, SEE COMMENT. Microscopic Comment The carcinoma appears grade 2. Prognostic markers will be ordered. Dr. Lyndon Code has reviewed the case. The case was called to [facility] on 06/12/2017. Results: HER2 - **POSITIVE** RATIO OF HER2/CEP17 SIGNALS 2.55 AVERAGE HER2 COPY NUMBER PER CELL 4.85      RADIOGRAPHIC STUDIES: I have personally reviewed the radiological images as listed and agreed with the findings in the report. No results found.   ASSESSMENT & PLAN: Flor Whitacre is a 42 y.o. premenopausal female with a history of Vitamin D deficiency and high cholesterol and kidney stones, presented with a palpable right breast mass.   1. Malignant neoplasm of upper-outer quadrant of right breast in female, mixed invasive ductal and lobular carcinoma, pT3(m)N2aM0, stage 1b, ductal carcinoma triple positive, lobular  carcinoma ER+/PR+/HER2-,  Grade 2 2.  Genetics, negative for all tested mutations 3.  Insomnia 4.  Hot flashes 5.  Bone health 6. Postural dizziness, hypotension   Ms. Heward appears stable. She is tolerating letrozole well without significant side effects. Herceptin and perjeta remain on hold given her EF did not improve on repeat echo, still 45-50%. Dr. Aundra Dubin maintains her on carvedilol and losartan. He plans to recheck echo at the end of August. Given her low BP and postural dizziness, I recommend to hold losartan over the weekend. I will discuss medication with Dr. Aundra Dubin. Will  support her today with 500 cc NS, I encouraged her to hydrate well. BP did not drop significantly with position change while in clinic. She will check BP more often at home. Labs reviewed, [place] recovered, CBC and CMP otherwise unremarkable. Plan to f/u in 7 weeks after repeat echo and f/u with Dr. Aundra Dubin to consider resuming herceptin/perjeta. Continue letrozole. She agrees with the plan.      PLAN: -Check orthostatic BP in clinic  -500 cc NS over 1 hour  -Hold losartan x2 days, hydrate well -I will discuss medication with Dr. Aundra Dubin and f/u with the patient  -Continue letrozole -Lab, f/u after next echo in 7 weeks, possible resume herceptin/perjeta   All questions were answered. The patient knows to call the clinic with any problems, questions or concerns. No barriers to learning was detected. I spent 20 minutes counseling the patient face to face. The total time spent in the appointment was 25 minutes and more than 50% was on counseling and review of test results     [name], NP 05/29/18

## 2018-05-29 ENCOUNTER — Inpatient Hospital Stay: Payer: BLUE CROSS/BLUE SHIELD

## 2018-05-29 ENCOUNTER — Inpatient Hospital Stay (HOSPITAL_BASED_OUTPATIENT_CLINIC_OR_DEPARTMENT_OTHER): Payer: BLUE CROSS/BLUE SHIELD | Admitting: Nurse Practitioner

## 2018-05-29 ENCOUNTER — Other Ambulatory Visit: Payer: BLUE CROSS/BLUE SHIELD

## 2018-05-29 ENCOUNTER — Encounter: Payer: Self-pay | Admitting: Nurse Practitioner

## 2018-05-29 ENCOUNTER — Encounter: Payer: Self-pay | Admitting: Physical Therapy

## 2018-05-29 ENCOUNTER — Ambulatory Visit: Payer: BLUE CROSS/BLUE SHIELD | Admitting: Physical Therapy

## 2018-05-29 ENCOUNTER — Telehealth: Payer: Self-pay

## 2018-05-29 VITALS — BP 92/60 | HR 99 | Temp 98.7°F | Resp 18 | Ht 66.0 in | Wt 153.2 lb

## 2018-05-29 DIAGNOSIS — C50411 Malignant neoplasm of upper-outer quadrant of right female breast: Secondary | ICD-10-CM

## 2018-05-29 DIAGNOSIS — G4700 Insomnia, unspecified: Secondary | ICD-10-CM

## 2018-05-29 DIAGNOSIS — Z79811 Long term (current) use of aromatase inhibitors: Secondary | ICD-10-CM

## 2018-05-29 DIAGNOSIS — Z79899 Other long term (current) drug therapy: Secondary | ICD-10-CM | POA: Diagnosis not present

## 2018-05-29 DIAGNOSIS — R197 Diarrhea, unspecified: Secondary | ICD-10-CM

## 2018-05-29 DIAGNOSIS — I951 Orthostatic hypotension: Secondary | ICD-10-CM

## 2018-05-29 DIAGNOSIS — R232 Flushing: Secondary | ICD-10-CM

## 2018-05-29 DIAGNOSIS — Z170 Estrogen receptor positive status [ER+]: Secondary | ICD-10-CM | POA: Diagnosis not present

## 2018-05-29 DIAGNOSIS — R5383 Other fatigue: Secondary | ICD-10-CM

## 2018-05-29 DIAGNOSIS — L599 Disorder of the skin and subcutaneous tissue related to radiation, unspecified: Secondary | ICD-10-CM

## 2018-05-29 DIAGNOSIS — Z803 Family history of malignant neoplasm of breast: Secondary | ICD-10-CM

## 2018-05-29 DIAGNOSIS — R293 Abnormal posture: Secondary | ICD-10-CM

## 2018-05-29 DIAGNOSIS — Z800 Family history of malignant neoplasm of digestive organs: Secondary | ICD-10-CM

## 2018-05-29 DIAGNOSIS — M25611 Stiffness of right shoulder, not elsewhere classified: Secondary | ICD-10-CM

## 2018-05-29 DIAGNOSIS — C773 Secondary and unspecified malignant neoplasm of axilla and upper limb lymph nodes: Secondary | ICD-10-CM

## 2018-05-29 DIAGNOSIS — G5600 Carpal tunnel syndrome, unspecified upper limb: Secondary | ICD-10-CM

## 2018-05-29 DIAGNOSIS — M25619 Stiffness of unspecified shoulder, not elsewhere classified: Secondary | ICD-10-CM

## 2018-05-29 DIAGNOSIS — R600 Localized edema: Secondary | ICD-10-CM | POA: Diagnosis not present

## 2018-05-29 DIAGNOSIS — M25531 Pain in right wrist: Secondary | ICD-10-CM | POA: Diagnosis not present

## 2018-05-29 DIAGNOSIS — Z483 Aftercare following surgery for neoplasm: Secondary | ICD-10-CM | POA: Diagnosis not present

## 2018-05-29 DIAGNOSIS — R42 Dizziness and giddiness: Secondary | ICD-10-CM

## 2018-05-29 DIAGNOSIS — Z95828 Presence of other vascular implants and grafts: Secondary | ICD-10-CM

## 2018-05-29 DIAGNOSIS — M6281 Muscle weakness (generalized): Secondary | ICD-10-CM

## 2018-05-29 DIAGNOSIS — K219 Gastro-esophageal reflux disease without esophagitis: Secondary | ICD-10-CM | POA: Diagnosis not present

## 2018-05-29 DIAGNOSIS — M25612 Stiffness of left shoulder, not elsewhere classified: Secondary | ICD-10-CM | POA: Diagnosis not present

## 2018-05-29 DIAGNOSIS — Z9012 Acquired absence of left breast and nipple: Secondary | ICD-10-CM

## 2018-05-29 DIAGNOSIS — K5900 Constipation, unspecified: Secondary | ICD-10-CM

## 2018-05-29 LAB — COMPREHENSIVE METABOLIC PANEL
ALK PHOS: 109 U/L (ref 38–126)
ALT: 11 U/L (ref 0–44)
AST: 13 U/L — ABNORMAL LOW (ref 15–41)
Albumin: 3.8 g/dL (ref 3.5–5.0)
Anion gap: 7 (ref 5–15)
BILIRUBIN TOTAL: 0.4 mg/dL (ref 0.3–1.2)
BUN: 12 mg/dL (ref 6–20)
CALCIUM: 9.8 mg/dL (ref 8.9–10.3)
CHLORIDE: 104 mmol/L (ref 98–111)
CO2: 28 mmol/L (ref 22–32)
Creatinine, Ser: 1.02 mg/dL — ABNORMAL HIGH (ref 0.44–1.00)
Glucose, Bld: 93 mg/dL (ref 70–99)
Potassium: 4 mmol/L (ref 3.5–5.1)
Sodium: 139 mmol/L (ref 135–145)
TOTAL PROTEIN: 7.1 g/dL (ref 6.5–8.1)

## 2018-05-29 LAB — CBC WITH DIFFERENTIAL/PLATELET
Basophils Absolute: 0.1 10*3/uL (ref 0.0–0.1)
Basophils Relative: 1 %
EOS PCT: 4 %
Eosinophils Absolute: 0.2 10*3/uL (ref 0.0–0.5)
HEMATOCRIT: 34.1 % — AB (ref 34.8–46.6)
Hemoglobin: 11.6 g/dL (ref 11.6–15.9)
LYMPHS ABS: 0.9 10*3/uL (ref 0.9–3.3)
LYMPHS PCT: 20 %
MCH: 31.8 pg (ref 25.1–34.0)
MCHC: 34 g/dL (ref 31.5–36.0)
MCV: 93.5 fL (ref 79.5–101.0)
Monocytes Absolute: 0.4 10*3/uL (ref 0.1–0.9)
Monocytes Relative: 8 %
NEUTROS ABS: 3.1 10*3/uL (ref 1.5–6.5)
Neutrophils Relative %: 67 %
PLATELETS: 187 10*3/uL (ref 145–400)
RBC: 3.65 MIL/uL — AB (ref 3.70–5.45)
RDW: 12.5 % (ref 11.2–14.5)
WBC: 4.6 10*3/uL (ref 3.9–10.3)

## 2018-05-29 MED ORDER — SODIUM CHLORIDE 0.9 % IV SOLN
Freq: Once | INTRAVENOUS | Status: AC
  Administered 2018-05-29: 14:00:00 via INTRAVENOUS

## 2018-05-29 MED ORDER — SODIUM CHLORIDE 0.9% FLUSH
10.0000 mL | Freq: Once | INTRAVENOUS | Status: AC
  Administered 2018-05-29: 10 mL
  Filled 2018-05-29: qty 10

## 2018-05-29 NOTE — Patient Instructions (Addendum)
Dehydration, Adult Dehydration is when there is not enough fluid or water in your body. This happens when you lose more fluids than you take in. Dehydration can range from mild to very bad. It should be treated right away to keep it from getting very bad. Symptoms of mild dehydration may include:  Thirst.  Dry lips.  Slightly dry mouth.  Dry, warm skin.  Dizziness. Symptoms of moderate dehydration may include:  Very dry mouth.  Muscle cramps.  Dark pee (urine). Pee may be the color of tea.  Your body making less pee.  Your eyes making fewer tears.  Heartbeat that is uneven or faster than normal (palpitations).  Headache.  Light-headedness, especially when you stand up from sitting.  Fainting (syncope). Symptoms of very bad dehydration may include:  Changes in skin, such as: ? Cold and clammy skin. ? Blotchy (mottled) or pale skin. ? Skin that does not quickly return to normal after being lightly pinched and let go (poor skin turgor).  Changes in body fluids, such as: ? Feeling very thirsty. ? Your eyes making fewer tears. ? Not sweating when body temperature is high, such as in hot weather. ? Your body making very little pee.  Changes in vital signs, such as: ? Weak pulse. ? Pulse that is more than 100 beats a minute when you are sitting still. ? Fast breathing. ? Low blood pressure.  Other changes, such as: ? Sunken eyes. ? Cold hands and feet. ? Confusion. ? Lack of energy (lethargy). ? Trouble waking up from sleep. ? Short-term weight loss. ? Unconsciousness. Follow these instructions at home:  If told by your doctor, drink an ORS: ? Make an ORS by using instructions on the package. ? Start by drinking small amounts, about  cup (120 mL) every 5-10 minutes. ? Slowly drink more until you have had the amount that your doctor said to have.  Drink enough clear fluid to keep your pee clear or pale yellow. If you were told to drink an ORS, finish the ORS  first, then start slowly drinking clear fluids. Drink fluids such as: ? Water. Do not drink only water by itself. Doing that can make the salt (sodium) level in your body get too low (hyponatremia). ? Ice chips. ? Fruit juice that you have added water to (diluted). ? Low-calorie sports drinks.  Avoid: ? Alcohol. ? Drinks that have a lot of sugar. These include high-calorie sports drinks, fruit juice that does not have water added, and soda. ? Caffeine. ? Foods that are greasy or have a lot of fat or sugar.  Take over-the-counter and prescription medicines only as told by your doctor.  Do not take salt tablets. Doing that can make the salt level in your body get too high (hypernatremia).  Eat foods that have minerals (electrolytes). Examples include bananas, oranges, potatoes, tomatoes, and spinach.  Keep all follow-up visits as told by your doctor. This is important. Contact a doctor if:  You have belly (abdominal) pain that: ? Gets worse. ? Stays in one area (localizes).  You have a rash.  You have a stiff neck.  You get angry or annoyed more easily than normal (irritability).  You are more sleepy than normal.  You have a harder time waking up than normal.  You feel: ? Weak. ? Dizzy. ? Very thirsty.  You have peed (urinated) only a small amount of very dark pee during 6-8 hours. Get help right away if:  You have symptoms of   very bad dehydration.  You cannot drink fluids without throwing up (vomiting).  Your symptoms get worse with treatment.  You have a fever.  You have a very bad headache.  You are throwing up or having watery poop (diarrhea) and it: ? Gets worse. ? Does not go away.  You have blood or something green (bile) in your throw-up.  You have blood in your poop (stool). This may cause poop to look black and tarry.  You have not peed in 6-8 hours.  You pass out (faint).  Your heart rate when you are sitting still is more than 100 beats a  minute.  You have trouble breathing. This information is not intended to replace advice given to you by your health care provider. Make sure you discuss any questions you have with your health care provider. Document Released: 08/31/2009 Document Revised: 05/24/2016 Document Reviewed: 12/29/2015 Elsevier Interactive Patient Education  2018 Else[employer] Inc[employer]  Dehydration, Adult Dehydration is when there is not enough fluid or water in your body. This happens when you lose more fluids than you take in. Dehydration can range from mild to very bad. It should be treated right away to keep it from getting very bad. Symptoms of mild dehydration may include:  Thirst.  Dry lips.  Slightly dry mouth.  Dry, warm skin.  Dizziness. Symptoms of moderate dehydration may include:  Very dry mouth.  Muscle cramps.  Dark pee (urine). Pee may be the color of tea.  Your body making less pee.  Your eyes making fewer tears.  Heartbeat that is uneven or faster than normal (palpitations).  Headache.  Light-headedness, especially when you stand up from sitting.  Fainting (syncope). Symptoms of very bad dehydration may include:  Changes in skin, such as: ? Cold and clammy skin. ? Blotchy (mottled) or pale skin. ? Skin that does not quickly return to normal after being lightly pinched and let go (poor skin turgor).  Changes in body fluids, such as: ? Feeling very thirsty. ? Your eyes making fewer tears. ? Not sweating when body temperature is high, such as in hot weather. ? Your body making very little pee.  Changes in vital signs, such as: ? Weak pulse. ? Pulse that is more than 100 beats a minute when you are sitting still. ? Fast breathing. ? Low blood pressure.  Other changes, such as: ? Sunken eyes. ? Cold hands and feet. ? Confusion. ? Lack of energy (lethargy). ? Trouble waking up from sleep. ? Short-term weight loss. ? Unconsciousness. Follow these instructions at  home:  If told by your doctor, drink an ORS: ? Make an ORS by using instructions on the package. ? Start by drinking small amounts, about  cup (120 mL) every 5-10 minutes. ? Slowly drink more until you have had the amount that your doctor said to have.  Drink enough clear fluid to keep your pee clear or pale yellow. If you were told to drink an ORS, finish the ORS first, then start slowly drinking clear fluids. Drink fluids such as: ? Water. Do not drink only water by itself. Doing that can make the salt (sodium) level in your body get too low (hyponatremia). ? Ice chips. ? Fruit juice that you have added water to (diluted). ? Low-calorie sports drinks.  Avoid: ? Alcohol. ? Drinks that have a lot of sugar. These include high-calorie sports drinks, fruit juice that does not have water added, and soda. ? Caffeine. ? Foods that are greasy or have   a lot of fat or sugar.  Take over-the-counter and prescription medicines only as told by your doctor.  Do not take salt tablets. Doing that can make the salt level in your body get too high (hypernatremia).  Eat foods that have minerals (electrolytes). Examples include bananas, oranges, potatoes, tomatoes, and spinach.  Keep all follow-up visits as told by your doctor. This is important. Contact a doctor if:  You have belly (abdominal) pain that: ? Gets worse. ? Stays in one area (localizes).  You have a rash.  You have a stiff neck.  You get angry or annoyed more easily than normal (irritability).  You are more sleepy than normal.  You have a harder time waking up than normal.  You feel: ? Weak. ? Dizzy. ? Very thirsty.  You have peed (urinated) only a small amount of very dark pee during 6-8 hours. Get help right away if:  You have symptoms of very bad dehydration.  You cannot drink fluids without throwing up (vomiting).  Your symptoms get worse with treatment.  You have a fever.  You have a very bad headache.  You  are throwing up or having watery poop (diarrhea) and it: ? Gets worse. ? Does not go away.  You have blood or something green (bile) in your throw-up.  You have blood in your poop (stool). This may cause poop to look black and tarry.  You have not peed in 6-8 hours.  You pass out (faint).  Your heart rate when you are sitting still is more than 100 beats a minute.  You have trouble breathing. This information is not intended to replace advice given to you by your health care provider. Make sure you discuss any questions you have with your health care provider. Document Released: 08/31/2009 Document Revised: 05/24/2016 Document Reviewed: 12/29/2015 Elsevier Interactive Patient Education  2018 Elsevier Inc.  

## 2018-05-29 NOTE — Therapy (Signed)
[place], [place], [address] Phone: [phone]   Fax:  [phone]  Physical Therapy Treatment  Patient Details  Name: Sheila [name] MRN: [id number] Date of Birth: May 20, 1976 Referring Provider: Burr Medico   Encounter Date: 05/29/2018  PT End of Session - 05/29/18 1139    Visit Number  3    Number of Visits  9    Date for PT Re-Evaluation  06/17/18    PT Start Time  1102    PT Stop Time  1140    PT Time Calculation (min)  38 min    Activity Tolerance  Patient tolerated treatment well    Behavior During Therapy  [facility] for tasks assessed/performed       Past Medical History:  Diagnosis Date  . Anxiety   . Cancer [place])    right breast   . Family history of breast cancer   . Family history of colon cancer   . GERD (gastroesophageal reflux disease)   . Headache    couple of migraines in the past  . History of kidney stones   . Vitamin D deficiency     Past Surgical History:  Procedure Laterality Date  . egg donation    . kidney stone removal    . MASTECTOMY W/ SENTINEL NODE BIOPSY Bilateral 07/22/2017   Procedure: RIGHT TOTAL MASTECTOMY WITH RIGHT AXILLARY SENTINEL LYMPH NODE BIOPSY, LEFT PROPHYLACTIC MASTECTOMY;  Surgeon: [name], MD;  Location: [place];  Service: General;  Laterality: Bilateral;  . PORTACATH PLACEMENT Right 07/22/2017   Procedure: INSERTION PORT-A-CATH WITH [name];  Surgeon: [name], MD;  Location: [place];  Service: General;  Laterality: Right;    There were no vitals filed for this visit.  Subjective Assessment - 05/29/18 1106    Subjective  Ionto patch did well, no adverse skin reactions. I feel 80%-90% improved.     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 It measures 5.5 cm and is located in the upper outer quadrant with a Ki67 of 12%. , Bilateral mastectomies on 07/22/17 with  SLNB on R with 2 to 3 additional nodes taken, pt has to complete chemo and radiation and will begin chemo on Aug 29, 2017    Currently in Pain?  No/denies    Multiple Pain Sites  No         [facility] PT Assessment - 05/29/18 0001      AROM   Right Shoulder Flexion  150 Degrees    Right Shoulder ABduction  150 Degrees                   [facility] Adult PT Treatment/Exercise - 05/29/18 0001      Shoulder Exercises: Standing   Flexion  -- wall walks 6x, scaption wall waks 6x      Iontophoresis   Type of Iontophoresis  Dexamethasone #2 skin intact, no redness    Location  Rt anterior wrist point of tenderness    Dose  1 ml    Time  6 hr wear, pt educated on wear time verbally. Pt verbally understood.      Manual Therapy   Manual Therapy  Passive ROM    Soft tissue mobilization  RT forearm extensor mass and flexor muscles, bicep    Passive ROM  RT shoulder all planes static stretching abduction for axilla region 3x 30 sec  PT Long Term Goals - 05/29/18 1140      PT LONG TERM GOAL #2   Title  Pt will be independent in a home exercise program for continued strengthening and stretching to decrease right hand/wrist discomfort    Time  4    Period  Weeks    Status  On-going      PT LONG TERM GOAL #3   Title  Pt will demonstrate no cording in right axilla to decrease tightness with right shoulder ROM    Baseline  2 cords palpable and visible with abduction    Time  4    Status  On-going            Plan - 05/29/18 1141    Clinical Impression Statement  Pt reports today her symptoms are 80%-90% improved. This meets LTG #1. She had no adverse reaction to the ionto patch. Second patch was applied today to the same area. She had what appears to be neural tension inher Rt shoulder during PROM. [place]  joint had no restrictions, but pt had limittaions due to initially a painful feeling of tightness but after some stretching the pain abolished and it felt  a little tight.  Shoulder measurements have not improved since eval.  Pt was instructed to start her wall walks again for both flexion and abduction/scaption.  She verbally agreed to do so and demonstrated both directions correctly and pain free.     Rehab Potential  Good    Clinical Impairments Affecting Rehab Potential  hx of radiation    PT Frequency  2x / week    PT Duration  4 weeks    PT Treatment/Interventions  ADLs/Self Care Home Management;Therapeutic activities;Therapeutic exercise;Patient/family education;Manual techniques;Scar mobilization;Passive range of motion;Taping;Iontophoresis 41m/ml Dexamethasone    PT Next Visit Plan  Ionto #3 . Manual work to Rt forearm, work on cording in RT axilla/stretching to Rt shoulder including neural tension stretching    PT Home Exercise Plan  median nerve stretch, closed fist/outstretched fingers, closed fist/stop sign, flexion/extension of wrist, prayer position- get splint    Consulted and Agree with Plan of Care  Patient       Patient will benefit from skilled therapeutic intervention in order to improve the following deficits and impairments:  Pain, Impaired sensation, Increased fascial restricitons, Decreased scar mobility, Postural dysfunction  Visit Diagnosis: Pain in right wrist  Abnormal posture  Disorder of the skin and subcutaneous tissue related to radiation, unspecified  Stiffness of right shoulder, not elsewhere classified  Muscle weakness (generalized)     Problem List Patient Active Problem List   Diagnosis Date Noted  . Multifocal pneumonia 12/31/2017  . Fever due to infection   . Hypoxia   . Influenza A 12/29/2017  . Lactic acidosis 12/28/2017  . Lung mass 12/28/2017  . Diarrhea 12/28/2017  . Sinus tachycardia 12/28/2017  . Sepsis ([facility] 09/04/2017  . Port-A-Cath in place 08/28/2017  . Genetic testing 06/25/2017  . Family history of breast cancer   . Family history of colon cancer   . Malignant neoplasm of  upper-outer quadrant of right breast in female, estrogen receptor positive ([facility] 06/16/2017    Narmeen Kerper, PTA 05/29/2018, 11:51 AM  [place] [address]Phone: [phone]  Fax:  [phone] Name: ATwana WilemanMRN: [id number] of Birth: [birth year]

## 2018-05-29 NOTE — Telephone Encounter (Signed)
Printed avs and calender of upcoming appointment. Per 7/12 los 

## 2018-05-30 LAB — CANCER ANTIGEN 27.29: CA 27.29: 8.9 U/mL (ref 0.0–38.6)

## 2018-06-01 ENCOUNTER — Encounter: Payer: Self-pay | Admitting: Nurse Practitioner

## 2018-06-01 ENCOUNTER — Telehealth: Payer: Self-pay | Admitting: Nurse Practitioner

## 2018-06-01 NOTE — Telephone Encounter (Signed)
I called patient to check on her status, BP has been 90's at home, dizziness improved after IVF in clinic. Per message from Dr. Aundra Dubin, he would like her to restart losartan at night. I encouraged her to keep log of BP and symptoms, to call cardiology and/or our office if she becomes symptomatic again. She verbalizes understanding and appreciates the call.

## 2018-06-03 ENCOUNTER — Encounter: Payer: Self-pay | Admitting: Physical Therapy

## 2018-06-03 ENCOUNTER — Ambulatory Visit: Payer: BLUE CROSS/BLUE SHIELD | Admitting: Physical Therapy

## 2018-06-03 DIAGNOSIS — M25612 Stiffness of left shoulder, not elsewhere classified: Secondary | ICD-10-CM | POA: Diagnosis not present

## 2018-06-03 DIAGNOSIS — Z483 Aftercare following surgery for neoplasm: Secondary | ICD-10-CM

## 2018-06-03 DIAGNOSIS — L599 Disorder of the skin and subcutaneous tissue related to radiation, unspecified: Secondary | ICD-10-CM

## 2018-06-03 DIAGNOSIS — M25531 Pain in right wrist: Secondary | ICD-10-CM | POA: Diagnosis not present

## 2018-06-03 DIAGNOSIS — M6281 Muscle weakness (generalized): Secondary | ICD-10-CM

## 2018-06-03 DIAGNOSIS — C50411 Malignant neoplasm of upper-outer quadrant of right female breast: Secondary | ICD-10-CM | POA: Diagnosis not present

## 2018-06-03 DIAGNOSIS — R293 Abnormal posture: Secondary | ICD-10-CM | POA: Diagnosis not present

## 2018-06-03 DIAGNOSIS — M25611 Stiffness of right shoulder, not elsewhere classified: Secondary | ICD-10-CM | POA: Diagnosis not present

## 2018-06-03 DIAGNOSIS — R600 Localized edema: Secondary | ICD-10-CM | POA: Diagnosis not present

## 2018-06-03 DIAGNOSIS — Z170 Estrogen receptor positive status [ER+]: Secondary | ICD-10-CM | POA: Diagnosis not present

## 2018-06-03 NOTE — Patient Instructions (Signed)
  PNF Strengthening: Resisted   Standing with resistive band around each hand, bring right arm up and away, thumb back. Repeat _10___ times per set. Do _2___ sets per session. Do _1-2___ sessions per day.      Resisted Horizontal Abduction: Bilateral    Start doing this exercise laying down for the first week, then sit or stand, tubing in both hands, arms out in front. Keeping arms straight, pinch shoulder blades together and stretch arms out. Repeat _10___ times per set. Do 2____ sets per session. Do _1-2___ sessions per day.                  Scapular Retraction: Elbow Flexion (Standing)   With elbows bent to 90, pinch shoulder blades together and rotate arms out, keeping elbows bent. Repeat 3-5_ times per set. Do _1___ sets per session. Do many____ sessions per day.

## 2018-06-03 NOTE — Therapy (Signed)
[place], [place], [address] Phone: [phone]   Fax:  [phone]  Physical Therapy Treatment  Patient Details  Name: Sheila [name] MRN: [id number] Date of Birth: 06/02/1976 Referring Provider: Burr Medico   Encounter Date: 06/03/2018  PT End of Session - 06/03/18 1102    Visit Number  4    Number of Visits  9    Date for PT Re-Evaluation  06/17/18    PT Start Time  1102    PT Stop Time  1143    PT Time Calculation (min)  41 min    Activity Tolerance  Patient tolerated treatment well    Behavior During Therapy  [facility] for tasks assessed/performed       Past Medical History:  Diagnosis Date  . Anxiety   . Cancer [facility])    right breast   . Family history of breast cancer   . Family history of colon cancer   . GERD (gastroesophageal reflux disease)   . Headache    couple of migraines in the past  . History of kidney stones   . Vitamin D deficiency     Past Surgical History:  Procedure Laterality Date  . egg donation    . kidney stone removal    . MASTECTOMY W/ SENTINEL NODE BIOPSY Bilateral 07/22/2017   Procedure: RIGHT TOTAL MASTECTOMY WITH RIGHT AXILLARY SENTINEL LYMPH NODE BIOPSY, LEFT PROPHYLACTIC MASTECTOMY;  Surgeon: [name], MD;  Location: [place];  Service: General;  Laterality: Bilateral;  . PORTACATH PLACEMENT Right 07/22/2017   Procedure: INSERTION PORT-A-CATH WITH [name];  Surgeon: [name], MD;  Location: [place];  Service: General;  Laterality: Right;    There were no vitals filed for this visit.  Subjective Assessment - 06/03/18 1103    Subjective  Ionto #2 did well. She tried her shoulder stretches at home, they are painful to do. Still positive about her improvement.     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 It measures 5.5 cm and is located in the upper outer quadrant with a  Ki67 of 12%. , Bilateral mastectomies on 07/22/17 with SLNB on R with 2 to 3 additional nodes taken, pt has to complete chemo and radiation and will begin chemo on Aug 29, 2017    Currently in Pain?  Yes    Pain Score  2     Pain Location  -- upper back, neck    Pain Orientation  Mid;Upper    Aggravating Factors   Maybe my medication    Pain Relieving Factors  Not sure    Multiple Pain Sites  No                       [facility] Adult PT Treatment/Exercise - 06/03/18 0001      Shoulder Exercises: Supine   Horizontal ABduction  Strengthening;Both;20 reps;Theraband    Theraband Level (Shoulder Horizontal ABduction)  Level 2 (Red)    Other Supine Exercises  seated and supine 10x each       Iontophoresis   Type of Iontophoresis  Dexamethasone #3 skin intact, no redness    Location  Rt anterior wrist point of tenderness    Dose  1 ml    Time  6 hr wear, pt educated on wear time verbally. Pt verbally understood.      Manual Therapy   Manual Therapy  Passive  ROM    Soft tissue mobilization  RT forearm extensor mass and flexor muscles, bicep RT axilla, triceps, teres, lateral trunk    Passive ROM  RT shoulder all planes static stretching abduction for axilla region 3x 30 sec             PT Education - 06/03/18 1131    Education Details  HEP red band spine horizontal abd, seated scap squeezes for postural relief througout the day.    Person(s) Educated  Patient    Methods  Explanation;Demonstration;Tactile cues;Verbal cues;Handout    Comprehension  Verbalized understanding;Returned demonstration          PT Long Term Goals - 05/29/18 1140      PT LONG TERM GOAL #2   Title  Pt will be independent in a home exercise program for continued strengthening and stretching to decrease right hand/wrist discomfort    Time  4    Period  Weeks    Status  On-going      PT LONG TERM GOAL #3   Title  Pt will demonstrate no cording in right axilla to decrease tightness with  right shoulder ROM    Baseline  2 cords palpable and visible with abduction    Time  4    Status  On-going            Plan - 06/03/18 1102    Clinical Impression Statement  Pt's Rt shoulder PROM was not painful and pretty close to full range today. Pt did have muscular tightness throughout the RT axilla, slightly posterior to the RTC and down the lateral trunk. pt reported she felt this is why her shoulder did better with PROM and stretching. Pt continues to tolerate the ionto patches very well, skin tolerance remarkable.  Added red band tband for level one scapular/postural strength for her complaints of her mid back being annoyingly uncomfortable. This was added to progress her HEP.     Rehab Potential  Good    Clinical Impairments Affecting Rehab Potential  hx of radiation    PT Frequency  2x / week    PT Duration  4 weeks    PT Treatment/Interventions  ADLs/Self Care Home Management;Therapeutic activities;Therapeutic exercise;Patient/family education;Manual techniques;Scar mobilization;Passive range of motion;Taping;Iontophoresis 59m/ml Dexamethasone    PT Next Visit Plan  ionto #4, soft tissue work to RT axilla,, trunk. Review exercises given today for band, add diagonals to HEP.    PT Home Exercise Plan  median nerve stretch, closed fist/outstretched fingers, closed fist/stop sign, flexion/extension of wrist, prayer position- get splint    Consulted and Agree with Plan of Care  Patient       Patient will benefit from skilled therapeutic intervention in order to improve the following deficits and impairments:  Pain, Impaired sensation, Increased fascial restricitons, Decreased scar mobility, Postural dysfunction  Visit Diagnosis: Pain in right wrist  Abnormal posture  Disorder of the skin and subcutaneous tissue related to radiation, unspecified  Stiffness of right shoulder, not elsewhere classified  Muscle weakness (generalized)  Aftercare following surgery for  neoplasm  Stiffness of left shoulder, not elsewhere classified     Problem List Patient Active Problem List   Diagnosis Date Noted  . Multifocal pneumonia 12/31/2017  . Fever due to infection   . Hypoxia   . Influenza A 12/29/2017  . Lactic acidosis 12/28/2017  . Lung mass 12/28/2017  . Diarrhea 12/28/2017  . Sinus tachycardia 12/28/2017  . Sepsis ([facility] 09/04/2017  . Port-A-Cath in place  08/28/2017  . Genetic testing 06/25/2017  . Family history of breast cancer   . Family history of colon cancer   . Malignant neoplasm of upper-outer quadrant of right breast in female, estrogen receptor positive ([place]) 06/16/2017    COCHRAN,JENNIFER, PTA 06/03/2018, 11:48 AM  [place], [place], [address] Phone: [phone]   Fax:  [phone]  Name: Sheila [name] MRN: [id number] Date of Birth: 1976/02/14

## 2018-06-05 ENCOUNTER — Ambulatory Visit: Payer: BLUE CROSS/BLUE SHIELD | Admitting: Physical Therapy

## 2018-06-05 ENCOUNTER — Other Ambulatory Visit (HOSPITAL_COMMUNITY): Payer: Self-pay | Admitting: Cardiology

## 2018-06-05 ENCOUNTER — Encounter: Payer: Self-pay | Admitting: Physical Therapy

## 2018-06-05 DIAGNOSIS — M6281 Muscle weakness (generalized): Secondary | ICD-10-CM | POA: Diagnosis not present

## 2018-06-05 DIAGNOSIS — R600 Localized edema: Secondary | ICD-10-CM

## 2018-06-05 DIAGNOSIS — M25611 Stiffness of right shoulder, not elsewhere classified: Secondary | ICD-10-CM

## 2018-06-05 DIAGNOSIS — R293 Abnormal posture: Secondary | ICD-10-CM

## 2018-06-05 DIAGNOSIS — M25531 Pain in right wrist: Secondary | ICD-10-CM

## 2018-06-05 DIAGNOSIS — M25612 Stiffness of left shoulder, not elsewhere classified: Secondary | ICD-10-CM

## 2018-06-05 DIAGNOSIS — L599 Disorder of the skin and subcutaneous tissue related to radiation, unspecified: Secondary | ICD-10-CM

## 2018-06-05 DIAGNOSIS — Z483 Aftercare following surgery for neoplasm: Secondary | ICD-10-CM | POA: Diagnosis not present

## 2018-06-05 DIAGNOSIS — C50411 Malignant neoplasm of upper-outer quadrant of right female breast: Secondary | ICD-10-CM | POA: Diagnosis not present

## 2018-06-05 DIAGNOSIS — Z170 Estrogen receptor positive status [ER+]: Secondary | ICD-10-CM

## 2018-06-05 NOTE — Therapy (Signed)
[place], [place], [address] Phone: [phone]   Fax:  [phone]  Physical Therapy Treatment  Patient Details  Name: Sheila [name] MRN: [id number] Date of Birth: Jul 29, 1976 Referring Provider: Burr Medico   Encounter Date: 06/05/2018  PT End of Session - 06/05/18 1103    Visit Number  5    Number of Visits  9    Date for PT Re-Evaluation  06/17/18    PT Start Time  1100    PT Stop Time  1139    PT Time Calculation (min)  39 min    Activity Tolerance  Patient tolerated treatment well    Behavior During Therapy  [facility] for tasks assessed/performed       Past Medical History:  Diagnosis Date  . Anxiety   . Cancer [facility])    right breast   . Family history of breast cancer   . Family history of colon cancer   . GERD (gastroesophageal reflux disease)   . Headache    couple of migraines in the past  . History of kidney stones   . Vitamin D deficiency     Past Surgical History:  Procedure Laterality Date  . egg donation    . kidney stone removal    . MASTECTOMY W/ SENTINEL NODE BIOPSY Bilateral 07/22/2017   Procedure: RIGHT TOTAL MASTECTOMY WITH RIGHT AXILLARY SENTINEL LYMPH NODE BIOPSY, LEFT PROPHYLACTIC MASTECTOMY;  Surgeon: [name], MD;  Location: [place];  Service: General;  Laterality: Bilateral;  . PORTACATH PLACEMENT Right 07/22/2017   Procedure: INSERTION PORT-A-CATH WITH [name];  Surgeon: [name], MD;  Location: [place];  Service: General;  Laterality: Right;    There were no vitals filed for this visit.  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 It measures 5.5 cm and is located in the upper outer quadrant with a Ki67 of 12%. , Bilateral mastectomies on  07/22/17 with SLNB on R with 2 to 3 additional nodes taken, pt has to complete chemo and radiation and will begin chemo on Aug 29, 2017    Patient Stated Goals  to figure out why my hand is tingling    Currently in Pain?  --    Multiple Pain Sites  --                       [facility] Adult PT Treatment/Exercise - 06/05/18 0001      Shoulder Exercises: Standing   Other Standing Exercises  roll ball up wall with end range stretch 8x    Other Standing Exercises  tennis ball to RT scapula region for TP release. Instreucted pt in techniques to use at home if she got some good benefit from it. pt could correctly demo.       Iontophoresis   Type of Iontophoresis  Dexamethasone #4 skin intact, no redness    Location  Rt anterior wrist point of tenderness    Dose  1 ml    Time  6 hr wear, pt educated on wear time verbally. Pt verbally understood.      Manual Therapy   Manual Therapy  Passive ROM all planes    Soft tissue mobilization  RT forearm extensor mass and flexor muscles,  bicep RT axilla, triceps, teres, lateral trunk    Passive ROM  RT shoulder all planes static stretching abduction for axilla region 3x 30 sec                  PT Long Term Goals - 05/29/18 1140      PT LONG TERM GOAL #2   Title  Pt will be independent in a home exercise program for continued strengthening and stretching to decrease right hand/wrist discomfort    Time  4    Period  Weeks    Status  On-going      PT LONG TERM GOAL #3   Title  Pt will demonstrate no cording in right axilla to decrease tightness with right shoulder ROM    Baseline  2 cords palpable and visible with abduction    Time  4    Status  On-going            Plan - 06/05/18 1103    Clinical Impression Statement  Pt independent and compliant with new exercises. Educated pt how to use the tennis ball on the wall (or supine) to work on her trigger points in her upper back/shoulder region at home. Pt had no pain or  complaints today, reported she was pleased how she is progressing.      Rehab Potential  Good    Clinical Impairments Affecting Rehab Potential  hx of radiation    PT Frequency  2x / week    PT Duration  4 weeks    PT Treatment/Interventions  ADLs/Self Care Home Management;Therapeutic activities;Therapeutic exercise;Patient/family education;Manual techniques;Scar mobilization;Passive range of motion;Taping;Iontophoresis 78m/ml Dexamethasone    PT Next Visit Plan  Ionto #5, soft tissue work to Rt shoulder/axilla/pectorals, review tennis ball ex if needed, give diagonals with band for HEP.     Consulted and Agree with Plan of Care  Patient       Patient will benefit from skilled therapeutic intervention in order to improve the following deficits and impairments:  Pain, Impaired sensation, Increased fascial restricitons, Decreased scar mobility, Postural dysfunction  Visit Diagnosis: Pain in right wrist  Abnormal posture  Disorder of the skin and subcutaneous tissue related to radiation, unspecified  Stiffness of right shoulder, not elsewhere classified  Muscle weakness (generalized)  Aftercare following surgery for neoplasm  Stiffness of left shoulder, not elsewhere classified  Localized edema  Carcinoma of upper-outer quadrant of right breast in female, estrogen receptor positive ([facility]     Problem List Patient Active Problem List   Diagnosis Date Noted  . Multifocal pneumonia 12/31/2017  . Fever due to infection   . Hypoxia   . Influenza A 12/29/2017  . Lactic acidosis 12/28/2017  . Lung mass 12/28/2017  . Diarrhea 12/28/2017  . Sinus tachycardia 12/28/2017  . Sepsis ([place] 09/04/2017  . Port-A-Cath in place 08/28/2017  . Genetic testing 06/25/2017  . Family history of breast cancer   . Family history of colon cancer   . Malignant neoplasm of upper-outer quadrant of right breast in female, estrogen receptor positive ([place] 06/16/2017    Melis Trochez,  PTA 06/05/2018, 11:44 AM  [place] [address]Phone: [phone]  Fax:  [phone] Name: Sheila RawlinsonMRN: [id number] of Birth: 3January 22, 1977

## 2018-06-09 DIAGNOSIS — Z0000 Encounter for general adult medical examination without abnormal findings: Secondary | ICD-10-CM | POA: Diagnosis not present

## 2018-06-09 DIAGNOSIS — E559 Vitamin D deficiency, unspecified: Secondary | ICD-10-CM | POA: Diagnosis not present

## 2018-06-09 DIAGNOSIS — E7800 Pure hypercholesterolemia, unspecified: Secondary | ICD-10-CM | POA: Diagnosis not present

## 2018-06-09 DIAGNOSIS — R5383 Other fatigue: Secondary | ICD-10-CM | POA: Diagnosis not present

## 2018-06-10 ENCOUNTER — Encounter: Payer: Self-pay | Admitting: Physical Therapy

## 2018-06-10 ENCOUNTER — Ambulatory Visit: Payer: BLUE CROSS/BLUE SHIELD | Admitting: Physical Therapy

## 2018-06-10 DIAGNOSIS — M25612 Stiffness of left shoulder, not elsewhere classified: Secondary | ICD-10-CM | POA: Diagnosis not present

## 2018-06-10 DIAGNOSIS — Z170 Estrogen receptor positive status [ER+]: Secondary | ICD-10-CM | POA: Diagnosis not present

## 2018-06-10 DIAGNOSIS — M6281 Muscle weakness (generalized): Secondary | ICD-10-CM

## 2018-06-10 DIAGNOSIS — L599 Disorder of the skin and subcutaneous tissue related to radiation, unspecified: Secondary | ICD-10-CM

## 2018-06-10 DIAGNOSIS — M25611 Stiffness of right shoulder, not elsewhere classified: Secondary | ICD-10-CM

## 2018-06-10 DIAGNOSIS — M25531 Pain in right wrist: Secondary | ICD-10-CM

## 2018-06-10 DIAGNOSIS — R293 Abnormal posture: Secondary | ICD-10-CM | POA: Diagnosis not present

## 2018-06-10 DIAGNOSIS — R600 Localized edema: Secondary | ICD-10-CM | POA: Diagnosis not present

## 2018-06-10 DIAGNOSIS — Z483 Aftercare following surgery for neoplasm: Secondary | ICD-10-CM

## 2018-06-10 DIAGNOSIS — C50411 Malignant neoplasm of upper-outer quadrant of right female breast: Secondary | ICD-10-CM | POA: Diagnosis not present

## 2018-06-10 NOTE — Therapy (Signed)
[place], [place], [address] Phone: [phone]   Fax:  [phone]  Physical Therapy Treatment  Patient Details  Name: Sheila [name] MRN: [id number] Date of Birth: 1976/08/05 Referring Provider: Burr [name]   Encounter Date: 06/10/2018  PT End of Session - 06/10/18 1059    Visit Number  6    Number of Visits  9    Date for PT Re-Evaluation  06/17/18    PT Start Time  6010    PT Stop Time  1139    PT Time Calculation (min)  41 min    Activity Tolerance  Patient tolerated treatment well    Behavior During Therapy  [facility] for tasks assessed/performed       Past Medical History:  Diagnosis Date  . Anxiety   . Cancer [facility])    right breast   . Family history of breast cancer   . Family history of colon cancer   . GERD (gastroesophageal reflux disease)   . Headache    couple of migraines in the past  . History of kidney stones   . Vitamin D deficiency     Past Surgical History:  Procedure Laterality Date  . egg donation    . kidney stone removal    . MASTECTOMY W/ SENTINEL NODE BIOPSY Bilateral 07/22/2017   Procedure: RIGHT TOTAL MASTECTOMY WITH RIGHT AXILLARY SENTINEL LYMPH NODE BIOPSY, LEFT PROPHYLACTIC MASTECTOMY;  Surgeon: Sheila Bookbinder, MD;  Location: [place];  Service: General;  Laterality: Bilateral;  . PORTACATH PLACEMENT Right 07/22/2017   Procedure: INSERTION PORT-A-CATH WITH [name];  Surgeon: Sheila Bookbinder, MD;  Location: [place];  Service: General;  Laterality: Right;    There were no vitals filed for this visit.  Subjective Assessment - 06/10/18 1135    Subjective  I have had more tingling over the last few days, not sure why? Blood pressure has been low, mayne a correlation? I am wearing my brace and doing my stretches faithfully.    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 It measures 5.5 cm and is  located in the upper outer quadrant with a Ki67 of 12%. , Bilateral mastectomies on 07/22/17 with SLNB on R with 2 to 3 additional nodes taken, pt has to complete chemo and radiation and will begin chemo on Aug 29, 2017    Currently in Pain?  No/denies    Multiple Pain Sites  No                       [facility] Adult PT Treatment/Exercise - 06/10/18 0001      Shoulder Exercises: Supine   Horizontal ABduction  Strengthening;Both;10 reps;Theraband    Theraband Level (Shoulder Horizontal ABduction)  Level 2 (Red)    Other Supine Exercises  Red band diagonals 10 bil       Iontophoresis   Type of Iontophoresis  Dexamethasone #5 skin intact, no redness    Location  Rt anterior wrist point of tenderness    Dose  1 ml    Time  6 hr wear, pt educated on wear time verbally. Pt verbally understood.      Manual Therapy   Manual Therapy  Passive ROM all planes    Soft tissue mobilization  Rt axilla, tricep, pectorals MFR to pectoral and axilla area  PT Long Term Goals - 06/10/18 1125      PT LONG TERM GOAL #2   Title  Pt will be independent in a home exercise program for continued strengthening and stretching to decrease right hand/wrist discomfort    Time  4    Period  Weeks    Status  On-going      PT LONG TERM GOAL #3   Title  Pt will demonstrate no cording in right axilla to decrease tightness with right shoulder ROM    Time  4    Period  Weeks    Status  Achieved            Plan - 06/10/18 1141    Clinical Impression Statement  Pt reports her blood pressure has been pretty low lately, MD is aware per pt. She has also had more tingling in her fingers over the last few days even when wearing her brace. She reports she faithfully does her stretching and wears her brace. Axillary tissues were thick, no chording appears with shoulder abduction, meeting long term goal.     Rehab Potential  Good    Clinical Impairments Affecting Rehab Potential  hx  of radiation    PT Frequency  2x / week    PT Duration  4 weeks    PT Treatment/Interventions  ADLs/Self Care Home Management;Therapeutic activities;Therapeutic exercise;Patient/family education;Manual techniques;Scar mobilization;Passive range of motion;Taping;Iontophoresis 93m/ml Dexamethasone    PT Next Visit Plan  Complete ionto, follow up with tingling and how it has been overe the last few days.     Consulted and Agree with Plan of Care  Patient       Patient will benefit from skilled therapeutic intervention in order to improve the following deficits and impairments:  Pain, Impaired sensation, Increased fascial restricitons, Decreased scar mobility, Postural dysfunction  Visit Diagnosis: Pain in right wrist  Abnormal posture  Disorder of the skin and subcutaneous tissue related to radiation, unspecified  Stiffness of right shoulder, not elsewhere classified  Muscle weakness (generalized)  Aftercare following surgery for neoplasm     Problem List Patient Active Problem List   Diagnosis Date Noted  . Multifocal pneumonia 12/31/2017  . Fever due to infection   . Hypoxia   . Influenza A 12/29/2017  . Lactic acidosis 12/28/2017  . Lung mass 12/28/2017  . Diarrhea 12/28/2017  . Sinus tachycardia 12/28/2017  . Sepsis ([place] 09/04/2017  . Port-A-Cath in place 08/28/2017  . Genetic testing 06/25/2017  . Family history of breast cancer   . Family history of colon cancer   . Malignant neoplasm of upper-outer quadrant of right breast in female, estrogen receptor positive ([place] 06/16/2017    COCHRAN,Sheila [name], PTA 06/10/2018, 11:44 AM  [place] [address]Phone: [phone]  Fax:  [phone] Name: AAnnjanette WertenbergerMRN: [id number] of Birth: 316-Jun-1977

## 2018-06-12 ENCOUNTER — Ambulatory Visit: Payer: BLUE CROSS/BLUE SHIELD | Admitting: Physical Therapy

## 2018-06-12 ENCOUNTER — Encounter: Payer: Self-pay | Admitting: Physical Therapy

## 2018-06-12 DIAGNOSIS — L599 Disorder of the skin and subcutaneous tissue related to radiation, unspecified: Secondary | ICD-10-CM

## 2018-06-12 DIAGNOSIS — Z483 Aftercare following surgery for neoplasm: Secondary | ICD-10-CM | POA: Diagnosis not present

## 2018-06-12 DIAGNOSIS — Z170 Estrogen receptor positive status [ER+]: Secondary | ICD-10-CM | POA: Diagnosis not present

## 2018-06-12 DIAGNOSIS — M25611 Stiffness of right shoulder, not elsewhere classified: Secondary | ICD-10-CM | POA: Diagnosis not present

## 2018-06-12 DIAGNOSIS — M25531 Pain in right wrist: Secondary | ICD-10-CM

## 2018-06-12 DIAGNOSIS — C50411 Malignant neoplasm of upper-outer quadrant of right female breast: Secondary | ICD-10-CM | POA: Diagnosis not present

## 2018-06-12 DIAGNOSIS — R293 Abnormal posture: Secondary | ICD-10-CM

## 2018-06-12 DIAGNOSIS — M6281 Muscle weakness (generalized): Secondary | ICD-10-CM | POA: Diagnosis not present

## 2018-06-12 DIAGNOSIS — M25612 Stiffness of left shoulder, not elsewhere classified: Secondary | ICD-10-CM | POA: Diagnosis not present

## 2018-06-12 DIAGNOSIS — R600 Localized edema: Secondary | ICD-10-CM | POA: Diagnosis not present

## 2018-06-12 NOTE — Therapy (Signed)
[place], [place], [address] Phone: [phone]   Fax:  [phone]  Physical Therapy Treatment  Patient Details  Name: Sheila [name] MRN: [id number] Date of Birth: 1976/09/11 Referring Provider: Burr Medico   Encounter Date: 06/12/2018  PT End of Session - 06/12/18 1104    Visit Number  7    Number of Visits  9    Date for PT Re-Evaluation  06/17/18    PT Start Time  1104    PT Stop Time  1145    PT Time Calculation (min)  41 min    Activity Tolerance  Patient tolerated treatment well    Behavior During Therapy  [facility] for tasks assessed/performed       Past Medical History:  Diagnosis Date  . Anxiety   . Cancer [facility])    right breast   . Family history of breast cancer   . Family history of colon cancer   . GERD (gastroesophageal reflux disease)   . Headache    couple of migraines in the past  . History of kidney stones   . Vitamin D deficiency     Past Surgical History:  Procedure Laterality Date  . egg donation    . kidney stone removal    . MASTECTOMY W/ SENTINEL NODE BIOPSY Bilateral 07/22/2017   Procedure: RIGHT TOTAL MASTECTOMY WITH RIGHT AXILLARY SENTINEL LYMPH NODE BIOPSY, LEFT PROPHYLACTIC MASTECTOMY;  Surgeon: [name], MD;  Location: [place];  Service: General;  Laterality: Bilateral;  . PORTACATH PLACEMENT Right 07/22/2017   Procedure: INSERTION PORT-A-CATH WITH [name];  Surgeon: [name], MD;  Location: [place];  Service: General;  Laterality: Right;    There were no vitals filed for this visit.  Subjective Assessment - 06/12/18 1106    Subjective  I would like to do more PT on my shoulder for sure. I want to be able to move it easier like I used to.     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 It measures 5.5 cm and is located in the upper outer quadrant with a Ki67 of 12%. ,  Bilateral mastectomies on 07/22/17 with SLNB on R with 2 to 3 additional nodes taken, pt has to complete chemo and radiation and will begin chemo on Aug 29, 2017    Patient Stated Goals  to figure out why my hand is tingling    Currently in Pain?  No/denies    Multiple Pain Sites  No                       [facility] Adult PT Treatment/Exercise - 06/12/18 0001      Shoulder Exercises: Supine   External Rotation  -- Gentle ER/pectoral  stretch with hands behind the head: 2x20    Flexion  AROM;Right;10 reps end range stretch to tolerance      Iontophoresis   Type of Iontophoresis  Dexamethasone #6 skin intact, no redness    Location  Rt anterior wrist point of tenderness    Dose  1 ml    Time  6 hr wear, pt educated on wear time verbally. Pt verbally understood.      Manual Therapy   Manual Therapy  Passive ROM all planes    Soft tissue mobilization  Rt axilla, tricep, pectorals MFR to pectoral and axilla area  PT Long Term Goals - 06/12/18 1108      PT LONG TERM GOAL #1   Title  Pt will report a 75% improvement in numbness in right hand to allow improved comfort    Time  4    Period  Weeks    Status  Achieved 90%      PT LONG TERM GOAL #2   Title  Pt will be independent in a home exercise program for continued strengthening and stretching to decrease right hand/wrist discomfort    Time  4    Period  Weeks    Status  On-going            Plan - 06/12/18 1105    Clinical Impression Statement  Pt reports the numbness in her Rt hand is 90% abolished. Tingling is better but remains. She wears her brace and is compliant with her stretching. She has had to limit herself to mostly supine stretches vs standing due to her low blood pressure. She sees her MD about the BP in August but reports today she was thinking of giving then a call today since she feels it worsened this week.  RT pectorals remain limited, axillary soft tissue showed increassed  suppleness today. PROM was excellent, only some minor soreness at end range flexion.     Rehab Potential  Good    Clinical Impairments Affecting Rehab Potential  hx of radiation    PT Frequency  2x / week    PT Duration  4 weeks    PT Treatment/Interventions  ADLs/Self Care Home Management;Therapeutic activities;Therapeutic exercise;Patient/family education;Manual techniques;Scar mobilization;Passive range of motion;Taping;Iontophoresis 35m/ml Dexamethasone    PT Next Visit Plan  Re-evaluation by Pt next session as her current auth wil expire 7/31. Pt would like to continue with therapy with the focus more on her RT shoulder. She would like to continue working on her pectorals and axillary region which she feels will allow her to move her arm  better and farther.     PT Home Exercise Plan  median nerve stretch, closed fist/outstretched fingers, closed fist/stop sign, flexion/extension of wrist, prayer position- get splint    Consulted and Agree with Plan of Care  Patient       Patient will benefit from skilled therapeutic intervention in order to improve the following deficits and impairments:  Pain, Impaired sensation, Increased fascial restricitons, Decreased scar mobility, Postural dysfunction  Visit Diagnosis: Pain in right wrist  Abnormal posture  Disorder of the skin and subcutaneous tissue related to radiation, unspecified  Stiffness of right shoulder, not elsewhere classified  Muscle weakness (generalized)     Problem List Patient Active Problem List   Diagnosis Date Noted  . Multifocal pneumonia 12/31/2017  . Fever due to infection   . Hypoxia   . Influenza A 12/29/2017  . Lactic acidosis 12/28/2017  . Lung mass 12/28/2017  . Diarrhea 12/28/2017  . Sinus tachycardia 12/28/2017  . Sepsis ([facility] 09/04/2017  . Port-A-Cath in place 08/28/2017  . Genetic testing 06/25/2017  . Family history of breast cancer   . Family history of colon cancer   . Malignant neoplasm of  upper-outer quadrant of right breast in female, estrogen receptor positive ([place] 06/16/2017    Kalik Hoare, PTA 06/12/2018, 11:49 AM  [place] [address]Phone: [phone]  Fax:  [phone] Name: AShaya ReddickMRN: [id number] of Birth: 3Dec 26, 1977

## 2018-06-15 ENCOUNTER — Encounter: Payer: Self-pay | Admitting: Rehabilitation

## 2018-06-15 ENCOUNTER — Ambulatory Visit: Payer: BLUE CROSS/BLUE SHIELD | Admitting: Rehabilitation

## 2018-06-15 DIAGNOSIS — M25611 Stiffness of right shoulder, not elsewhere classified: Secondary | ICD-10-CM

## 2018-06-15 DIAGNOSIS — R600 Localized edema: Secondary | ICD-10-CM | POA: Diagnosis not present

## 2018-06-15 DIAGNOSIS — C50411 Malignant neoplasm of upper-outer quadrant of right female breast: Secondary | ICD-10-CM

## 2018-06-15 DIAGNOSIS — M25531 Pain in right wrist: Secondary | ICD-10-CM | POA: Diagnosis not present

## 2018-06-15 DIAGNOSIS — M6281 Muscle weakness (generalized): Secondary | ICD-10-CM | POA: Diagnosis not present

## 2018-06-15 DIAGNOSIS — Z483 Aftercare following surgery for neoplasm: Secondary | ICD-10-CM | POA: Diagnosis not present

## 2018-06-15 DIAGNOSIS — L599 Disorder of the skin and subcutaneous tissue related to radiation, unspecified: Secondary | ICD-10-CM | POA: Diagnosis not present

## 2018-06-15 DIAGNOSIS — Z170 Estrogen receptor positive status [ER+]: Secondary | ICD-10-CM | POA: Diagnosis not present

## 2018-06-15 DIAGNOSIS — M25612 Stiffness of left shoulder, not elsewhere classified: Secondary | ICD-10-CM | POA: Diagnosis not present

## 2018-06-15 DIAGNOSIS — R293 Abnormal posture: Secondary | ICD-10-CM | POA: Diagnosis not present

## 2018-06-15 NOTE — Therapy (Signed)
[place], [place], [address] Phone: [phone]   Fax:  [phone]  Physical Therapy Treatment  Patient Details  Name: Sheila [name] MRN: [id number] Date of Birth: 25-Oct-1976 Referring Provider: Burr Medico   Encounter Date: 06/15/2018  PT End of Session - 06/15/18 1345    Visit Number  8    Number of Visits  16    Date for PT Re-Evaluation  07/13/18    PT Start Time  1302    PT Stop Time  1344    PT Time Calculation (min)  42 min    Activity Tolerance  Patient tolerated treatment well    Behavior During Therapy  [facility] for tasks assessed/performed       Past Medical History:  Diagnosis Date  . Anxiety   . Cancer [facility])    right breast   . Family history of breast cancer   . Family history of colon cancer   . GERD (gastroesophageal reflux disease)   . Headache    couple of migraines in the past  . History of kidney stones   . Vitamin D deficiency     Past Surgical History:  Procedure Laterality Date  . egg donation    . kidney stone removal    . MASTECTOMY W/ SENTINEL NODE BIOPSY Bilateral 07/22/2017   Procedure: RIGHT TOTAL MASTECTOMY WITH RIGHT AXILLARY SENTINEL LYMPH NODE BIOPSY, LEFT PROPHYLACTIC MASTECTOMY;  Surgeon: [name], MD;  Location: [place];  Service: General;  Laterality: Bilateral;  . PORTACATH PLACEMENT Right 07/22/2017   Procedure: INSERTION PORT-A-CATH WITH [name];  Surgeon: [name], MD;  Location: [place];  Service: General;  Laterality: Right;    There were no vitals filed for this visit.  Subjective Assessment - 06/15/18 1300    Subjective  Would like to switch to shoulder ROM focus.  It just feels tight even in sitting.      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 It measures 5.5 cm and is located in the upper outer quadrant with a Ki67 of 12%. , Bilateral mastectomies on  07/22/17 with SLNB on R with 2 to 3 additional nodes taken, pt has to complete chemo and radiation and will begin chemo on Aug 29, 2017    Currently in Pain?  No/denies         [facility] PT Assessment - 06/15/18 0001      ROM / Strength   AROM / PROM / Strength  Strength;PROM      AROM   Right Shoulder Flexion  130 Degrees pre treatment (142 post)     Right Shoulder ABduction  130 Degrees    Right Shoulder Internal Rotation  -- T12 vs T4     Right Shoulder External Rotation  -- behind the head 90% with pull      PROM   PROM Assessment Site  Shoulder    Right/Left Shoulder  Right    Right Shoulder Flexion  140 Degrees    Right Shoulder ABduction  130 Degrees pull in pectoralis; straight plane    Right Shoulder External Rotation  70 Degrees at 90 abd      Strength   Strength Assessment Site  Shoulder    Right/Left Shoulder  Right;Left    Right Shoulder Flexion  4/5    Right Shoulder ABduction  4/5    Right Shoulder Internal Rotation  5/5  Right Shoulder External Rotation  5/5    Left Shoulder Flexion  5/5    Left Shoulder ABduction  4/5    Left Shoulder Internal Rotation  5/5    Left Shoulder External Rotation  5/5                   [facility] Adult PT Treatment/Exercise - 06/15/18 0001      Shoulder Exercises: Supine   Other Supine Exercises  AAROM hand holding x 10 6" holds and behind the head butterfly stretch 3x20" with PT overpressure/pect release      Manual Therapy   Soft tissue mobilization  Rt pectoralis in neutral and stretched positions    Passive ROM  to Rt shoulder to tolerance with flexion, abduction, and pectoralis stretch positions wiith and without STM                  PT Long Term Goals - 06/15/18 1321      PT LONG TERM GOAL #1   Title  Pt will report a 75% improvement in numbness in right hand to allow improved comfort    Status  Achieved      PT LONG TERM GOAL #2   Title  Pt will be independent in a home exercise program for  continued strengthening and stretching to decrease right hand/wrist discomfort    Status  On-going      PT LONG TERM GOAL #3   Title  Pt will demonstrate no cording in right axilla to decrease tightness with right shoulder ROM    Status  Achieved      PT LONG TERM GOAL #4   Title  Pt will improve Rt shoulder AROM to 160 flexion and abduction     Time  4    Period  Weeks    Status  New    Target Date  07/13/18      PT LONG TERM GOAL #5   Title  Pt will improve resting pectoralis tightness to minimal     Time  4    Period  Weeks    Status  New    Target Date  07/13/18              Patient will benefit from skilled therapeutic intervention in order to improve the following deficits and impairments:     Visit Diagnosis: Malignant neoplasm of upper-outer quadrant of right breast in female, estrogen receptor positive ([facility])  Stiffness of right shoulder, not elsewhere classified     Problem List Patient Active Problem List   Diagnosis Date Noted  . Multifocal pneumonia 12/31/2017  . Fever due to infection   . Hypoxia   . Influenza A 12/29/2017  . Lactic acidosis 12/28/2017  . Lung mass 12/28/2017  . Diarrhea 12/28/2017  . Sinus tachycardia 12/28/2017  . Sepsis ([place]) 09/04/2017  . Port-A-Cath in place 08/28/2017  . Genetic testing 06/25/2017  . Family history of breast cancer   . Family history of colon cancer   . Malignant neoplasm of upper-outer quadrant of right breast in female, estrogen receptor positive ([place]) 06/16/2017    Shan Levans, PT  06/15/2018, 2:27 PM  [place], [place], [address] Phone: [phone]   Fax:  [phone]  Name: Sheila [name] MRN: [id number] Date of Birth: 1976-09-03

## 2018-06-16 ENCOUNTER — Telehealth: Payer: Self-pay

## 2018-06-16 NOTE — Telephone Encounter (Signed)
I think it's probably not related to chemo. Dr. Drema Dallas may treat it if it's clinically indicated. Thanks.   [name] MD

## 2018-06-16 NOTE — Telephone Encounter (Signed)
Sheila [name] with Dr. [name] office states they faxed the patient's lab results which showed a slightly underactive thyroid.  Could this be a results of her Breast Cancer treatment or do they need to go ahead and treat?  [phone]

## 2018-06-17 ENCOUNTER — Encounter: Payer: Self-pay | Admitting: Physical Therapy

## 2018-06-17 ENCOUNTER — Ambulatory Visit: Payer: BLUE CROSS/BLUE SHIELD | Admitting: Physical Therapy

## 2018-06-17 DIAGNOSIS — Z483 Aftercare following surgery for neoplasm: Secondary | ICD-10-CM | POA: Diagnosis not present

## 2018-06-17 DIAGNOSIS — M25611 Stiffness of right shoulder, not elsewhere classified: Secondary | ICD-10-CM | POA: Diagnosis not present

## 2018-06-17 DIAGNOSIS — Z170 Estrogen receptor positive status [ER+]: Principal | ICD-10-CM

## 2018-06-17 DIAGNOSIS — R293 Abnormal posture: Secondary | ICD-10-CM

## 2018-06-17 DIAGNOSIS — R600 Localized edema: Secondary | ICD-10-CM | POA: Diagnosis not present

## 2018-06-17 DIAGNOSIS — L599 Disorder of the skin and subcutaneous tissue related to radiation, unspecified: Secondary | ICD-10-CM | POA: Diagnosis not present

## 2018-06-17 DIAGNOSIS — M6281 Muscle weakness (generalized): Secondary | ICD-10-CM

## 2018-06-17 DIAGNOSIS — C50411 Malignant neoplasm of upper-outer quadrant of right female breast: Secondary | ICD-10-CM

## 2018-06-17 DIAGNOSIS — M25531 Pain in right wrist: Secondary | ICD-10-CM | POA: Diagnosis not present

## 2018-06-17 DIAGNOSIS — M25612 Stiffness of left shoulder, not elsewhere classified: Secondary | ICD-10-CM | POA: Diagnosis not present

## 2018-06-17 NOTE — Telephone Encounter (Signed)
Contacted Dr. Drema Dallas office left message regarding underactive thyroid as per Dr. Burr Medico.

## 2018-06-17 NOTE — Therapy (Signed)
[place], [place], [address] Phone: [phone]   Fax:  [phone]  Physical Therapy Treatment  Patient Details  Name: Sheila [name] MRN: [id number] Date of Birth: 02/25/1976 Referring Provider: Burr Medico   Encounter Date: 06/17/2018  PT End of Session - 06/17/18 1105    Visit Number  9    Number of Visits  16    Date for PT Re-Evaluation  07/13/18    PT Start Time  1103    PT Stop Time  1141    PT Time Calculation (min)  38 min    Activity Tolerance  Patient tolerated treatment well    Behavior During Therapy  [facility] for tasks assessed/performed       Past Medical History:  Diagnosis Date  . Anxiety   . Cancer [facility])    right breast   . Family history of breast cancer   . Family history of colon cancer   . GERD (gastroesophageal reflux disease)   . Headache    couple of migraines in the past  . History of kidney stones   . Vitamin D deficiency     Past Surgical History:  Procedure Laterality Date  . egg donation    . kidney stone removal    . MASTECTOMY W/ SENTINEL NODE BIOPSY Bilateral 07/22/2017   Procedure: RIGHT TOTAL MASTECTOMY WITH RIGHT AXILLARY SENTINEL LYMPH NODE BIOPSY, LEFT PROPHYLACTIC MASTECTOMY;  Surgeon: [name], MD;  Location: [place];  Service: General;  Laterality: Bilateral;  . PORTACATH PLACEMENT Right 07/22/2017   Procedure: INSERTION PORT-A-CATH WITH [name];  Surgeon: [name], MD;  Location: [place];  Service: General;  Laterality: Right;    There were no vitals filed for this visit.  Subjective Assessment - 06/17/18 1107    Subjective  I have a HA from neighbors banging on the floor. Shoulder is a little sore.     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 It measures 5.5 cm and is located in the upper outer quadrant with a Ki67 of 12%. , Bilateral mastectomies on 07/22/17  with SLNB on R with 2 to 3 additional nodes taken, pt has to complete chemo and radiation and will begin chemo on Aug 29, 2017    Currently in Pain?  Yes    Pain Score  2     Pain Location  Shoulder    Pain Orientation  Right    Pain Descriptors / Indicators  Sore    Aggravating Factors   ?    Pain Relieving Factors  Not sure    Multiple Pain Sites  No                       [facility] Adult PT Treatment/Exercise - 06/17/18 0001      Shoulder Exercises: Pulleys   Flexion  -- 20x    ABduction  -- 20x      Manual Therapy   Soft tissue mobilization  Rt pectoralis in neutral and stretched positions MFR Rt pecs/axilla/lateral trunk    Passive ROM  to Rt shoulder to tolerance with flexion, abduction, and pectoralis stretch positions wiith and without STM             PT Education - 06/17/18 1137    Education Details  increasing her awareness of where her [place] position is when she is resting, tendency is anterior and slightly  superior. Pt could correctly demo proper [place] position.     Person(s) Educated  Patient    Methods  Explanation;Demonstration    Comprehension  Returned demonstration;Verbalized understanding          PT Long Term Goals - 06/15/18 1321      PT LONG TERM GOAL #1   Title  Pt will report a 75% improvement in numbness in right hand to allow improved comfort    Status  Achieved      PT LONG TERM GOAL #2   Title  Pt will be independent in a home exercise program for continued strengthening and stretching to decrease right hand/wrist discomfort    Status  On-going      PT LONG TERM GOAL #3   Title  Pt will demonstrate no cording in right axilla to decrease tightness with right shoulder ROM    Status  Achieved      PT LONG TERM GOAL #4   Title  Pt will improve Rt shoulder AROM to 160 flexion and abduction     Time  4    Period  Weeks    Status  New    Target Date  07/13/18      PT LONG TERM GOAL #5   Title  Pt will improve resting pectoralis  tightness to minimal     Time  4    Period  Weeks    Status  New    Target Date  07/13/18            Plan - 06/17/18 1105    Clinical Impression Statement  Pt presents today with a headache and had a hard time getting off the bed at the end of her session due to low back pain. [place] movement was excellent today, focused primarily on myofascial movement along the trunk/pectorals/and axilla.  Educated pt in proper resting position of the [place] joint as she was observed resting with her humerus anterior and  superior.      Rehab Potential  Good    Clinical Impairments Affecting Rehab Potential  hx of radiation    PT Frequency  2x / week    PT Duration  4 weeks    PT Treatment/Interventions  ADLs/Self Care Home Management;Therapeutic activities;Therapeutic exercise;Patient/family education;Manual techniques;Scar mobilization;Passive range of motion;Taping;Iontophoresis 31m/ml Dexamethasone    PT Next Visit Plan  Work on myofascial restrictions, shoulder/scapular AROM/PROM, scapular stabilization    Consulted and Agree with Plan of Care  Patient       Patient will benefit from skilled therapeutic intervention in order to improve the following deficits and impairments:  Pain, Impaired sensation, Increased fascial restricitons, Decreased scar mobility, Postural dysfunction  Visit Diagnosis: Malignant neoplasm of upper-outer quadrant of right breast in female, estrogen receptor positive (HCC)  Stiffness of right shoulder, not elsewhere classified  Pain in right wrist  Abnormal posture  Disorder of the skin and subcutaneous tissue related to radiation, unspecified  Muscle weakness (generalized)     Problem List Patient Active Problem List   Diagnosis Date Noted  . Multifocal pneumonia 12/31/2017  . Fever due to infection   . Hypoxia   . Influenza A 12/29/2017  . Lactic acidosis 12/28/2017  . Lung mass 12/28/2017  . Diarrhea 12/28/2017  . Sinus tachycardia 12/28/2017  . Sepsis  ([place] 09/04/2017  . Port-A-Cath in place 08/28/2017  . Genetic testing 06/25/2017  . Family history of breast cancer   . Family history of colon cancer   . Malignant  neoplasm of upper-outer quadrant of right breast in female, estrogen receptor positive ([place]) 06/16/2017    Eola Waldrep, PTA 06/17/2018, 11:44 AM  [place], [place], [address] Phone: [phone]   Fax:  [phone]  Name: Quantia Grullon MRN: [id number] Date of Birth: July 15, 1976

## 2018-06-19 ENCOUNTER — Encounter: Payer: Self-pay | Admitting: Physical Therapy

## 2018-06-19 ENCOUNTER — Telehealth: Payer: Self-pay | Admitting: Hematology

## 2018-06-19 ENCOUNTER — Ambulatory Visit: Payer: BLUE CROSS/BLUE SHIELD | Attending: Hematology | Admitting: Physical Therapy

## 2018-06-19 DIAGNOSIS — Z170 Estrogen receptor positive status [ER+]: Secondary | ICD-10-CM | POA: Diagnosis not present

## 2018-06-19 DIAGNOSIS — M6281 Muscle weakness (generalized): Secondary | ICD-10-CM | POA: Diagnosis not present

## 2018-06-19 DIAGNOSIS — C50411 Malignant neoplasm of upper-outer quadrant of right female breast: Secondary | ICD-10-CM | POA: Diagnosis not present

## 2018-06-19 DIAGNOSIS — Z483 Aftercare following surgery for neoplasm: Secondary | ICD-10-CM | POA: Diagnosis not present

## 2018-06-19 DIAGNOSIS — R293 Abnormal posture: Secondary | ICD-10-CM | POA: Diagnosis not present

## 2018-06-19 DIAGNOSIS — M25611 Stiffness of right shoulder, not elsewhere classified: Secondary | ICD-10-CM | POA: Diagnosis not present

## 2018-06-19 DIAGNOSIS — L599 Disorder of the skin and subcutaneous tissue related to radiation, unspecified: Secondary | ICD-10-CM

## 2018-06-19 DIAGNOSIS — M25531 Pain in right wrist: Secondary | ICD-10-CM

## 2018-06-19 NOTE — Telephone Encounter (Signed)
Scheduled appt per 8/2 sch msg - spoke w/ pt re appts.

## 2018-06-19 NOTE — Therapy (Signed)
[place], [place], [address] Phone: [phone]   Fax:  [phone]  Physical Therapy Treatment  Patient Details  Name: Sheila [name] MRN: [id number] Date of Birth: 1975-11-21 Referring Provider: Burr Medico   Encounter Date: 06/19/2018  PT End of Session - 06/19/18 1108    Visit Number  10    Number of Visits  16    Date for PT Re-Evaluation  07/13/18    PT Start Time  1102    PT Stop Time  1146    PT Time Calculation (min)  44 min    Activity Tolerance  Patient tolerated treatment well    Behavior During Therapy  [facility] for tasks assessed/performed       Past Medical History:  Diagnosis Date  . Anxiety   . Cancer [facility])    right breast   . Family history of breast cancer   . Family history of colon cancer   . GERD (gastroesophageal reflux disease)   . Headache    couple of migraines in the past  . History of kidney stones   . Vitamin D deficiency     Past Surgical History:  Procedure Laterality Date  . egg donation    . kidney stone removal    . MASTECTOMY W/ SENTINEL NODE BIOPSY Bilateral 07/22/2017   Procedure: RIGHT TOTAL MASTECTOMY WITH RIGHT AXILLARY SENTINEL LYMPH NODE BIOPSY, LEFT PROPHYLACTIC MASTECTOMY;  Surgeon: [name], MD;  Location: [place];  Service: General;  Laterality: Bilateral;  . PORTACATH PLACEMENT Right 07/22/2017   Procedure: INSERTION PORT-A-CATH WITH [name];  Surgeon: [name], MD;  Location: [place];  Service: General;  Laterality: Right;    There were no vitals filed for this visit.  Subjective Assessment - 06/19/18 1111    Subjective  Shoulder feeling good, but my back is killing me. I am going to start taking a thyroid medication maybe tomorrow, I will speak to my doctor about the back pain.     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 It measures 5.5 cm and is located  in the upper outer quadrant with a Ki67 of 12%. , Bilateral mastectomies on 07/22/17 with SLNB on R with 2 to 3 additional nodes taken, pt has to complete chemo and radiation and will begin chemo on Aug 29, 2017    Currently in Pain?  Yes    Pain Score  3     Pain Location  Back    Pain Orientation  -- changes daily    Pain Descriptors / Indicators  Dull;Aching    Aggravating Factors   posture maybe? meds maybe?    Pain Relieving Factors  Not much    Multiple Pain Sites  No         [facility] PT Assessment - 06/19/18 0001      AROM   Right Shoulder Flexion  140 Degrees    Right Shoulder ABduction  150 Degrees slightly out of plane                   [facility] Adult PT Treatment/Exercise - 06/19/18 0001      Shoulder Exercises: Standing   Flexion  -- roll ball up wall for flexion 10x      Shoulder Exercises: Pulleys   Flexion  -- 20x    ABduction  -- 20x      Manual Therapy   Soft  tissue mobilization  Rt pectoralis in neutral and stretched positions, triceps and lats, deep TP at lat insertion,  MFR Rt pecs/axilla/lateral trunk    Passive ROM  to Rt shoulder to tolerance with flexion, abduction, and pectoralis stretch positions wiith and without STM                  PT Long Term Goals - 06/19/18 1152      PT LONG TERM GOAL #4   Title  Pt will improve Rt shoulder AROM to 160 flexion and abduction     Time  4    Period  Weeks    Status  On-going      PT LONG TERM GOAL #5   Title  Pt will improve resting pectoralis tightness to minimal     Time  4    Period  Weeks    Status  On-going improving!            Plan - 06/19/18 1108    Clinical Impression Statement  Pt presents with no shoulder complaints but with complaints of back pain. She plans on speaking to her MD abouthtis as she questions whether there is a correlation to a medicaiton or is it just postural weakness? She may be interested in learning back strengthening exercises at some time. [place]  positioning was observed as improved. Since so much time was spent on manual work we did not get time to add shoulder IR/ER to her HEP.  AROM improving back to her baselines.  Pt's fascia in the pectoral region had remarkably improved motion today.     Rehab Potential  Good    Clinical Impairments Affecting Rehab Potential  hx of radiation    PT Frequency  2x / week    PT Duration  4 weeks    PT Treatment/Interventions  ADLs/Self Care Home Management;Therapeutic activities;Therapeutic exercise;Patient/family education;Manual techniques;Scar mobilization;Passive range of motion;Taping;Iontophoresis 65m/ml Dexamethasone    PT Next Visit Plan  Add shoulder IR/ER with red band to HEP. Pt has band at home. Shoulder strength for flex/abd, followed by manual work to RT axilla, pectorals, lateral trunk.     PT Home Exercise Plan  median nerve stretch, closed fist/outstretched fingers, closed fist/stop sign, flexion/extension of wrist, prayer position- get splint    Consulted and Agree with Plan of Care  Patient       Patient will benefit from skilled therapeutic intervention in order to improve the following deficits and impairments:  Pain, Impaired sensation, Increased fascial restricitons, Decreased scar mobility, Postural dysfunction  Visit Diagnosis: Malignant neoplasm of upper-outer quadrant of right breast in female, estrogen receptor positive (HCC)  Stiffness of right shoulder, not elsewhere classified  Pain in right wrist  Abnormal posture  Disorder of the skin and subcutaneous tissue related to radiation, unspecified  Muscle weakness (generalized)     Problem List Patient Active Problem List   Diagnosis Date Noted  . Multifocal pneumonia 12/31/2017  . Fever due to infection   . Hypoxia   . Influenza A 12/29/2017  . Lactic acidosis 12/28/2017  . Lung mass 12/28/2017  . Diarrhea 12/28/2017  . Sinus tachycardia 12/28/2017  . Sepsis ([facility] 09/04/2017  . Port-A-Cath in place  08/28/2017  . Genetic testing 06/25/2017  . Family history of breast cancer   . Family history of colon cancer   . Malignant neoplasm of upper-outer quadrant of right breast in female, estrogen receptor positive ([place] 06/16/2017     JMyrene Galas PTA 06/19/18 11:55 AM  [place], [place], [address] Phone: [phone]   Fax:  [phone]  Name: Sheila [name] MRN: [id number] Date of Birth: 1976-05-18

## 2018-06-22 ENCOUNTER — Inpatient Hospital Stay: Payer: BLUE CROSS/BLUE SHIELD | Attending: Nurse Practitioner

## 2018-06-22 VITALS — BP 100/74 | HR 111 | Temp 99.0°F | Resp 16

## 2018-06-22 DIAGNOSIS — Z9013 Acquired absence of bilateral breasts and nipples: Secondary | ICD-10-CM | POA: Insufficient documentation

## 2018-06-22 DIAGNOSIS — C773 Secondary and unspecified malignant neoplasm of axilla and upper limb lymph nodes: Secondary | ICD-10-CM | POA: Diagnosis not present

## 2018-06-22 DIAGNOSIS — M549 Dorsalgia, unspecified: Secondary | ICD-10-CM | POA: Insufficient documentation

## 2018-06-22 DIAGNOSIS — G4700 Insomnia, unspecified: Secondary | ICD-10-CM | POA: Diagnosis not present

## 2018-06-22 DIAGNOSIS — Z170 Estrogen receptor positive status [ER+]: Secondary | ICD-10-CM | POA: Insufficient documentation

## 2018-06-22 DIAGNOSIS — M5137 Other intervertebral disc degeneration, lumbosacral region: Secondary | ICD-10-CM | POA: Insufficient documentation

## 2018-06-22 DIAGNOSIS — E559 Vitamin D deficiency, unspecified: Secondary | ICD-10-CM | POA: Diagnosis not present

## 2018-06-22 DIAGNOSIS — M542 Cervicalgia: Secondary | ICD-10-CM | POA: Insufficient documentation

## 2018-06-22 DIAGNOSIS — Z801 Family history of malignant neoplasm of trachea, bronchus and lung: Secondary | ICD-10-CM | POA: Diagnosis not present

## 2018-06-22 DIAGNOSIS — Z806 Family history of leukemia: Secondary | ICD-10-CM | POA: Insufficient documentation

## 2018-06-22 DIAGNOSIS — R232 Flushing: Secondary | ICD-10-CM | POA: Diagnosis not present

## 2018-06-22 DIAGNOSIS — R12 Heartburn: Secondary | ICD-10-CM | POA: Insufficient documentation

## 2018-06-22 DIAGNOSIS — Z8042 Family history of malignant neoplasm of prostate: Secondary | ICD-10-CM | POA: Insufficient documentation

## 2018-06-22 DIAGNOSIS — Z5112 Encounter for antineoplastic immunotherapy: Secondary | ICD-10-CM | POA: Diagnosis not present

## 2018-06-22 DIAGNOSIS — Z87442 Personal history of urinary calculi: Secondary | ICD-10-CM | POA: Diagnosis not present

## 2018-06-22 DIAGNOSIS — C50411 Malignant neoplasm of upper-outer quadrant of right female breast: Secondary | ICD-10-CM | POA: Diagnosis not present

## 2018-06-22 DIAGNOSIS — Z803 Family history of malignant neoplasm of breast: Secondary | ICD-10-CM | POA: Insufficient documentation

## 2018-06-22 DIAGNOSIS — Z800 Family history of malignant neoplasm of digestive organs: Secondary | ICD-10-CM | POA: Diagnosis not present

## 2018-06-22 DIAGNOSIS — F419 Anxiety disorder, unspecified: Secondary | ICD-10-CM | POA: Insufficient documentation

## 2018-06-22 DIAGNOSIS — E7800 Pure hypercholesterolemia, unspecified: Secondary | ICD-10-CM | POA: Insufficient documentation

## 2018-06-22 DIAGNOSIS — R040 Epistaxis: Secondary | ICD-10-CM | POA: Insufficient documentation

## 2018-06-22 DIAGNOSIS — R6884 Jaw pain: Secondary | ICD-10-CM | POA: Insufficient documentation

## 2018-06-22 DIAGNOSIS — Z793 Long term (current) use of hormonal contraceptives: Secondary | ICD-10-CM | POA: Diagnosis not present

## 2018-06-22 DIAGNOSIS — G629 Polyneuropathy, unspecified: Secondary | ICD-10-CM | POA: Diagnosis not present

## 2018-06-22 DIAGNOSIS — Z79899 Other long term (current) drug therapy: Secondary | ICD-10-CM | POA: Diagnosis not present

## 2018-06-22 DIAGNOSIS — Z95828 Presence of other vascular implants and grafts: Secondary | ICD-10-CM

## 2018-06-22 MED ORDER — GOSERELIN ACETATE 3.6 MG ~~LOC~~ IMPL
DRUG_IMPLANT | SUBCUTANEOUS | Status: AC
  Filled 2018-06-22: qty 3.6

## 2018-06-22 MED ORDER — GOSERELIN ACETATE 3.6 MG ~~LOC~~ IMPL
3.6000 mg | DRUG_IMPLANT | Freq: Once | SUBCUTANEOUS | Status: AC
  Administered 2018-06-22: 3.6 mg via SUBCUTANEOUS

## 2018-06-22 NOTE — Progress Notes (Signed)
Notified Dr. Burr Medico of tachycardia. Patient verbalizes mild DOE; however, patient states this is baseline. No new orders received. Advised ok to proceed with Zoladex.

## 2018-06-24 ENCOUNTER — Encounter: Payer: Self-pay | Admitting: *Deleted

## 2018-06-24 NOTE — Progress Notes (Signed)
Pt going to come by pick up medical records. They are at [facility]

## 2018-06-25 ENCOUNTER — Ambulatory Visit: Payer: BLUE CROSS/BLUE SHIELD

## 2018-06-25 DIAGNOSIS — C50411 Malignant neoplasm of upper-outer quadrant of right female breast: Secondary | ICD-10-CM

## 2018-06-25 DIAGNOSIS — Z170 Estrogen receptor positive status [ER+]: Secondary | ICD-10-CM | POA: Diagnosis not present

## 2018-06-25 DIAGNOSIS — M25611 Stiffness of right shoulder, not elsewhere classified: Secondary | ICD-10-CM

## 2018-06-25 DIAGNOSIS — M6281 Muscle weakness (generalized): Secondary | ICD-10-CM | POA: Diagnosis not present

## 2018-06-25 DIAGNOSIS — R293 Abnormal posture: Secondary | ICD-10-CM

## 2018-06-25 DIAGNOSIS — M25531 Pain in right wrist: Secondary | ICD-10-CM | POA: Diagnosis not present

## 2018-06-25 DIAGNOSIS — Z483 Aftercare following surgery for neoplasm: Secondary | ICD-10-CM | POA: Diagnosis not present

## 2018-06-25 DIAGNOSIS — L599 Disorder of the skin and subcutaneous tissue related to radiation, unspecified: Secondary | ICD-10-CM

## 2018-06-25 NOTE — Therapy (Signed)
Sheila [name], [place], [address] Phone: [phone]   Fax:  [phone]  Physical Therapy Treatment  Patient Details  Name: Sheila [name] MRN: [id number] Date of Birth: [birth year] Referring Provider: Burr [name]   Encounter Date: 06/25/2018  PT End of Session - 06/25/18 1106    Visit Number  11    Number of Visits  16    Date for PT Re-Evaluation  07/13/18    PT Start Time  1022    PT Stop Time  1105    PT Time Calculation (min)  43 min    Activity Tolerance  Patient tolerated treatment well    Behavior During Therapy  [facility] for tasks assessed/performed       Past Medical History:  Diagnosis Date  . Anxiety   . Cancer [facility])    right breast   . Family history of breast cancer   . Family history of colon cancer   . GERD (gastroesophageal reflux disease)   . Headache    couple of migraines in the past  . History of kidney stones   . Vitamin D deficiency     Past Surgical History:  Procedure Laterality Date  . egg donation    . kidney stone removal    . MASTECTOMY W/ SENTINEL NODE BIOPSY Bilateral 07/22/2017   Procedure: RIGHT TOTAL MASTECTOMY WITH RIGHT AXILLARY SENTINEL LYMPH NODE BIOPSY, LEFT PROPHYLACTIC MASTECTOMY;  Surgeon: Sheila Bookbinder, MD;  Location: [place];  Service: General;  Laterality: Bilateral;  . PORTACATH PLACEMENT Right 07/22/2017   Procedure: INSERTION PORT-A-CATH WITH [name];  Surgeon: Sheila Bookbinder, MD;  Location: [place];  Service: General;  Laterality: Right;    There were no vitals filed for this visit.  Subjective Assessment - 06/25/18 1028    Subjective  My Rt shoulder is doing better in some ways, like I can reach to the top of my head (demonstrates wiping her brow motion) but only with my elbow bent. Haven't spoken with my doctor yet about my back but plan to soon.    Pertinent History  Patient was diagnosed on 06/11/17 with right triple positive grade 2 invasive ductal  carcinoma breast cancer. It measures 5.5 cm and is located in the upper outer quadrant with a Ki67 of 12%. , Bilateral mastectomies on 07/22/17 with SLNB on R with 2 to 3 additional nodes taken, pt has to complete chemo and radiation and will begin chemo on Aug 29, 2017    Patient Stated Goals  to figure out why my hand is tingling    Currently in Pain?  No/denies                       [facility] Adult PT Treatment/Exercise - 06/25/18 0001      Shoulder Exercises: Standing   Other Standing Exercises  [place] with Rt shoulder with red theraband 10 times each      Manual Therapy   Manual Therapy  Myofascial release;Passive ROM    Soft tissue mobilization  --    Myofascial Release  To Rt axilla througout P/ROM; also UE pulling    Passive ROM  to Rt shoulder to tolerance with flexion, abduction, and er all to pts tolerance                         Plan - 06/25/18 1106    Clinical Impression Statement  Progressed pt to resume all of  [place] exercises at home as she tolerated these well today. Continued with manual therapy to focus on end ROM.     Rehab Potential  Good    Clinical Impairments Affecting Rehab Potential  hx of radiation    PT Frequency  2x / week    PT Duration  4 weeks    PT Treatment/Interventions  ADLs/Self Care Home Management;Therapeutic activities;Therapeutic exercise;Patient/family education;Manual techniques;Scar mobilization;Passive range of motion;Taping;Iontophoresis 28m/ml Dexamethasone    PT Next Visit Plan  Shoulder strength for flex/abd, followed by manual work to RT axilla, pectorals, lateral trunk.     Consulted and Agree with Plan of Care  Patient       Patient will benefit from skilled therapeutic intervention in order to improve the following deficits and impairments:  Pain, Impaired sensation, Increased fascial restricitons, Decreased scar mobility, Postural dysfunction  Visit Diagnosis: Malignant neoplasm of upper-outer  quadrant of right breast in female, estrogen receptor positive (HCC)  Stiffness of right shoulder, not elsewhere classified  Pain in right wrist  Abnormal posture  Disorder of the skin and subcutaneous tissue related to radiation, unspecified  Muscle weakness (generalized)     Problem List Patient Active Problem List   Diagnosis Date Noted  . Multifocal pneumonia 12/31/2017  . Fever due to infection   . Hypoxia   . Influenza A 12/29/2017  . Lactic acidosis 12/28/2017  . Lung mass 12/28/2017  . Diarrhea 12/28/2017  . Sinus tachycardia 12/28/2017  . Sepsis ([facility] 09/04/2017  . Port-A-Cath in place 08/28/2017  . Genetic testing 06/25/2017  . Family history of breast cancer   . Family history of colon cancer   . Malignant neoplasm of upper-outer quadrant of right breast in female, estrogen receptor positive ([place] 06/16/2017    ROtelia Limes PTA 06/25/2018, 11:10 AM  [place] [address]Phone: [phone]  Fax:  [phone] Name: AAmare KontosMRN: [id number] of Birth: 3March 01, 1977

## 2018-07-01 ENCOUNTER — Ambulatory Visit: Payer: BLUE CROSS/BLUE SHIELD

## 2018-07-01 DIAGNOSIS — Z483 Aftercare following surgery for neoplasm: Secondary | ICD-10-CM | POA: Diagnosis not present

## 2018-07-01 DIAGNOSIS — Z170 Estrogen receptor positive status [ER+]: Secondary | ICD-10-CM | POA: Diagnosis not present

## 2018-07-01 DIAGNOSIS — L599 Disorder of the skin and subcutaneous tissue related to radiation, unspecified: Secondary | ICD-10-CM | POA: Diagnosis not present

## 2018-07-01 DIAGNOSIS — M6281 Muscle weakness (generalized): Secondary | ICD-10-CM

## 2018-07-01 DIAGNOSIS — M25531 Pain in right wrist: Secondary | ICD-10-CM

## 2018-07-01 DIAGNOSIS — R293 Abnormal posture: Secondary | ICD-10-CM

## 2018-07-01 DIAGNOSIS — M25611 Stiffness of right shoulder, not elsewhere classified: Secondary | ICD-10-CM

## 2018-07-01 DIAGNOSIS — C50411 Malignant neoplasm of upper-outer quadrant of right female breast: Secondary | ICD-10-CM

## 2018-07-01 NOTE — Therapy (Signed)
[place], [place], [address] Phone: [phone]   Fax:  [phone]  Physical Therapy Treatment  Patient Details  Name: Sheila [name] MRN: [id number] Date of Birth: Mar 27, 1976 Referring Provider: Burr Medico   Encounter Date: 07/01/2018  PT End of Session - 07/01/18 1346    Visit Number  12    Number of Visits  16    Date for PT Re-Evaluation  07/13/18    PT Start Time  2774    PT Stop Time  1346    PT Time Calculation (min)  43 min    Activity Tolerance  Patient tolerated treatment well    Behavior During Therapy  [facility] for tasks assessed/performed       Past Medical History:  Diagnosis Date  . Anxiety   . Cancer [facility])    right breast   . Family history of breast cancer   . Family history of colon cancer   . GERD (gastroesophageal reflux disease)   . Headache    couple of migraines in the past  . History of kidney stones   . Vitamin D deficiency     Past Surgical History:  Procedure Laterality Date  . egg donation    . kidney stone removal    . MASTECTOMY W/ SENTINEL NODE BIOPSY Bilateral 07/22/2017   Procedure: RIGHT TOTAL MASTECTOMY WITH RIGHT AXILLARY SENTINEL LYMPH NODE BIOPSY, LEFT PROPHYLACTIC MASTECTOMY;  Surgeon: [name], MD;  Location: [place];  Service: General;  Laterality: Bilateral;  . PORTACATH PLACEMENT Right 07/22/2017   Procedure: INSERTION PORT-A-CATH WITH [name];  Surgeon: [name], MD;  Location: [place];  Service: General;  Laterality: Right;    There were no vitals filed for this visit.  Subjective Assessment - 07/01/18 1304    Subjective  My Rt shoulder is getting better and I've resumed my HEP strengthening exs at home and those are going well.     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 It measures 5.5 cm and is located in the upper outer quadrant with a Ki67 of 12%. ,  Bilateral mastectomies on 07/22/17 with SLNB on R with 2 to 3 additional nodes taken, pt has to complete chemo and radiation and will begin chemo on Aug 29, 2017    Patient Stated Goals  to figure out why my hand is tingling    Currently in Pain?  No/denies         [facility] PT Assessment - 07/01/18 0001      AROM   Right Shoulder Flexion  154 Degrees   after stretching   Right Shoulder ABduction  161 Degrees                   [facility] Adult PT Treatment/Exercise - 07/01/18 0001      Manual Therapy   Manual Therapy  Myofascial release;Passive ROM    Myofascial Release  To Rt axilla througout P/ROM; also UE pulling    Passive ROM  to Rt shoulder to tolerance with flexion, abduction, and er all to pts tolerance                  PT Long Term Goals - 07/01/18 1306      PT LONG TERM GOAL #1   Title  Pt will report a 75% improvement in numbness in right hand to allow improved comfort    Baseline  90%  improvement reported at this time-07/01/18    Status  Achieved      PT LONG TERM GOAL #2   Title  Pt will be independent in a home exercise program for continued strengthening and stretching to decrease right hand/wrist discomfort    Status  Partially Met      PT LONG TERM GOAL #3   Title  Pt will demonstrate no cording in right axilla to decrease tightness with right shoulder ROM    Baseline  2 cords palpable and visible with abduction; no longer present-07/01/18    Status  Achieved      PT LONG TERM GOAL #5   Title  Pt will improve resting pectoralis tightness to minimal     Status  Achieved            Plan - 07/01/18 1347    Clinical Impression Statement  Continued with end ROM manual therapy stretching. Measured pt at end of session and her A/ROM has improved well.     Rehab Potential  Good    Clinical Impairments Affecting Rehab Potential  hx of radiation    PT Frequency  2x / week    PT Duration  4 weeks    PT Treatment/Interventions  ADLs/Self Care  Home Management;Therapeutic activities;Therapeutic exercise;Patient/family education;Manual techniques;Scar mobilization;Passive range of motion;Taping;Iontophoresis 49m/ml Dexamethasone    PT Next Visit Plan  Cont with maual therapy focusing on end ROM; progress HEP for strength prn    Consulted and Agree with Plan of Care  Patient       Patient will benefit from skilled therapeutic intervention in order to improve the following deficits and impairments:  Pain, Impaired sensation, Increased fascial restricitons, Decreased scar mobility, Postural dysfunction  Visit Diagnosis: Malignant neoplasm of upper-outer quadrant of right breast in female, estrogen receptor positive (HCC)  Stiffness of right shoulder, not elsewhere classified  Pain in right wrist  Abnormal posture  Disorder of the skin and subcutaneous tissue related to radiation, unspecified  Muscle weakness (generalized)     Problem List Patient Active Problem List   Diagnosis Date Noted  . Multifocal pneumonia 12/31/2017  . Fever due to infection   . Hypoxia   . Influenza A 12/29/2017  . Lactic acidosis 12/28/2017  . Lung mass 12/28/2017  . Diarrhea 12/28/2017  . Sinus tachycardia 12/28/2017  . Sepsis ([facility] 09/04/2017  . Port-A-Cath in place 08/28/2017  . Genetic testing 06/25/2017  . Family history of breast cancer   . Family history of colon cancer   . Malignant neoplasm of upper-outer quadrant of right breast in female, estrogen receptor positive ([place] 06/16/2017    ROtelia Limes PTA 07/01/2018, 1:49 PM  [place] [address]Phone: [phone]  Fax:  [phone] Name: AJett KulzerMRN: [id number] of Birth: [birth year]

## 2018-07-03 ENCOUNTER — Ambulatory Visit: Payer: BLUE CROSS/BLUE SHIELD | Admitting: Rehabilitation

## 2018-07-03 ENCOUNTER — Encounter: Payer: Self-pay | Admitting: Rehabilitation

## 2018-07-03 DIAGNOSIS — R293 Abnormal posture: Secondary | ICD-10-CM | POA: Diagnosis not present

## 2018-07-03 DIAGNOSIS — C50411 Malignant neoplasm of upper-outer quadrant of right female breast: Secondary | ICD-10-CM

## 2018-07-03 DIAGNOSIS — M6281 Muscle weakness (generalized): Secondary | ICD-10-CM | POA: Diagnosis not present

## 2018-07-03 DIAGNOSIS — Z483 Aftercare following surgery for neoplasm: Secondary | ICD-10-CM | POA: Diagnosis not present

## 2018-07-03 DIAGNOSIS — M25531 Pain in right wrist: Secondary | ICD-10-CM | POA: Diagnosis not present

## 2018-07-03 DIAGNOSIS — L599 Disorder of the skin and subcutaneous tissue related to radiation, unspecified: Secondary | ICD-10-CM | POA: Diagnosis not present

## 2018-07-03 DIAGNOSIS — Z170 Estrogen receptor positive status [ER+]: Secondary | ICD-10-CM | POA: Diagnosis not present

## 2018-07-03 DIAGNOSIS — M25611 Stiffness of right shoulder, not elsewhere classified: Secondary | ICD-10-CM | POA: Diagnosis not present

## 2018-07-03 NOTE — Therapy (Signed)
[place], [place], [address] Phone: [phone]   Fax:  [phone]  Physical Therapy Treatment  Patient Details  Name: Sheila [name] MRN: [id number] Date of Birth: 07/30/1976 Referring Provider: Burr Medico   Encounter Date: 07/03/2018  PT End of Session - 07/03/18 1019    Visit Number  13    Number of Visits  16    Date for PT Re-Evaluation  07/13/18    PT Start Time  1019    PT Stop Time  1100    PT Time Calculation (min)  41 min       Past Medical History:  Diagnosis Date  . Anxiety   . Cancer [facility])    right breast   . Family history of breast cancer   . Family history of colon cancer   . GERD (gastroesophageal reflux disease)   . Headache    couple of migraines in the past  . History of kidney stones   . Vitamin D deficiency     Past Surgical History:  Procedure Laterality Date  . egg donation    . kidney stone removal    . MASTECTOMY W/ SENTINEL NODE BIOPSY Bilateral 07/22/2017   Procedure: RIGHT TOTAL MASTECTOMY WITH RIGHT AXILLARY SENTINEL LYMPH NODE BIOPSY, LEFT PROPHYLACTIC MASTECTOMY;  Surgeon: [name], MD;  Location: [place];  Service: General;  Laterality: Bilateral;  . PORTACATH PLACEMENT Right 07/22/2017   Procedure: INSERTION PORT-A-CATH WITH [name];  Surgeon: [name], MD;  Location: [place];  Service: General;  Laterality: Right;    There were no vitals filed for this visit.  Subjective Assessment - 07/03/18 1020    Subjective  Nothing new.  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 It measures 5.5 cm and is located in the upper outer quadrant with a Ki67 of 12%. , Bilateral mastectomies on 07/22/17 with SLNB on R with 2 to 3 additional nodes taken, pt has to complete chemo and radiation and will begin chemo on Aug 29, 2017    Patient Stated  Goals  increase ROM of the shoulder more     Currently in Pain?  No/denies                       [facility] Adult PT Treatment/Exercise - 07/03/18 0001      Exercises   Exercises  Shoulder      Shoulder Exercises: Stretch   Other Shoulder Stretches  open book sidelying stretch 5" x 10, sidelying abduction x 10, supine flexion 3# on dowel x 10      Manual Therapy   Myofascial Release  To Rt axilla througout P/ROM; also UE pulling    Passive ROM  to Rt shoulder to tolerance with flexion, abduction, and er all to pts tolerance                  PT Long Term Goals - 07/01/18 1306      PT LONG TERM GOAL #1   Title  Pt will report a 75% improvement in numbness in right hand to allow improved comfort    Baseline  90% improvement reported at this time-07/01/18    Status  Achieved      PT LONG TERM GOAL #2   Title  Pt will be independent in a home  exercise program for continued strengthening and stretching to decrease right hand/wrist discomfort    Status  Partially Met      PT LONG TERM GOAL #3   Title  Pt will demonstrate no cording in right axilla to decrease tightness with right shoulder ROM    Baseline  2 cords palpable and visible with abduction; no longer present-07/01/18    Status  Achieved      PT LONG TERM GOAL #5   Title  Pt will improve resting pectoralis tightness to minimal     Status  Achieved            Plan - 07/03/18 1019    Clinical Impression Statement  Focus on Rt myofasical release and Rt shoulder ROM with AROM and strengthening into the new ROM from treatment.  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      Clinical Impairments Affecting Rehab Potential  hx of radiation    PT Frequency  2x / week    PT Duration  4 weeks    PT Treatment/Interventions  ADLs/Self Care Home Management;Therapeutic activities;Therapeutic exercise;Patient/family education;Manual techniques;Scar mobilization;Passive range of  motion;Taping;Iontophoresis 88m/ml Dexamethasone    PT Next Visit Plan  Cont with maual therapy focusing on end ROM    PT Home Exercise Plan  median nerve stretch, closed fist/outstretched fingers, closed fist/stop sign, flexion/extension of wrist, prayer position- get splint, strength ABC    Consulted and Agree with Plan of Care  Patient       Patient will benefit from skilled therapeutic intervention in order to improve the following deficits and impairments:  Pain, Impaired sensation, Increased fascial restricitons, Decreased scar mobility, Postural dysfunction  Visit Diagnosis: Malignant neoplasm of upper-outer quadrant of right breast in female, estrogen receptor positive (HCC)  Stiffness of right shoulder, not elsewhere classified  Abnormal posture  Disorder of the skin and subcutaneous tissue related to radiation, unspecified  Aftercare following surgery for neoplasm     Problem List Patient Active Problem List   Diagnosis Date Noted  . Multifocal pneumonia 12/31/2017  . Fever due to infection   . Hypoxia   . Influenza A 12/29/2017  . Lactic acidosis 12/28/2017  . Lung mass 12/28/2017  . Diarrhea 12/28/2017  . Sinus tachycardia 12/28/2017  . Sepsis ([place] 09/04/2017  . Port-A-Cath in place 08/28/2017  . Genetic testing 06/25/2017  . Family history of breast cancer   . Family history of colon cancer   . Malignant neoplasm of upper-outer quadrant of right breast in female, estrogen receptor positive ([place] 06/16/2017    KShan Levans PT 07/03/2018, 10:57 AM  [facility] [address]Phone: [phone]  Fax:  [phone] Name: AJaleyah LonghiMRN: [id number] of Birth: 303-28-1977

## 2018-07-08 ENCOUNTER — Ambulatory Visit: Payer: BLUE CROSS/BLUE SHIELD

## 2018-07-08 DIAGNOSIS — Z483 Aftercare following surgery for neoplasm: Secondary | ICD-10-CM

## 2018-07-08 DIAGNOSIS — M25531 Pain in right wrist: Secondary | ICD-10-CM | POA: Diagnosis not present

## 2018-07-08 DIAGNOSIS — L599 Disorder of the skin and subcutaneous tissue related to radiation, unspecified: Secondary | ICD-10-CM

## 2018-07-08 DIAGNOSIS — M25611 Stiffness of right shoulder, not elsewhere classified: Secondary | ICD-10-CM | POA: Diagnosis not present

## 2018-07-08 DIAGNOSIS — R293 Abnormal posture: Secondary | ICD-10-CM | POA: Diagnosis not present

## 2018-07-08 DIAGNOSIS — C50411 Malignant neoplasm of upper-outer quadrant of right female breast: Secondary | ICD-10-CM

## 2018-07-08 DIAGNOSIS — M6281 Muscle weakness (generalized): Secondary | ICD-10-CM | POA: Diagnosis not present

## 2018-07-08 DIAGNOSIS — Z170 Estrogen receptor positive status [ER+]: Secondary | ICD-10-CM | POA: Diagnosis not present

## 2018-07-08 NOTE — Therapy (Signed)
[place], [place], [address] Phone: [phone]   Fax:  [phone]  Physical Therapy Treatment  Patient Details  Name: Sheila [name] MRN: [id number] Date of Birth: November 25, 1975 Referring Provider: Burr Medico   Encounter Date: 07/08/2018  PT End of Session - 07/08/18 1349    Visit Number  14    Number of Visits  16    Date for PT Re-Evaluation  07/13/18    PT Start Time  1302    PT Stop Time  1349    PT Time Calculation (min)  47 min    Activity Tolerance  Patient tolerated treatment well    Behavior During Therapy  [facility] for tasks assessed/performed       Past Medical History:  Diagnosis Date  . Anxiety   . Cancer [facility])    right breast   . Family history of breast cancer   . Family history of colon cancer   . GERD (gastroesophageal reflux disease)   . Headache    couple of migraines in the past  . History of kidney stones   . Vitamin D deficiency     Past Surgical History:  Procedure Laterality Date  . egg donation    . kidney stone removal    . MASTECTOMY W/ SENTINEL NODE BIOPSY Bilateral 07/22/2017   Procedure: RIGHT TOTAL MASTECTOMY WITH RIGHT AXILLARY SENTINEL LYMPH NODE BIOPSY, LEFT PROPHYLACTIC MASTECTOMY;  Surgeon: [name], MD;  Location: [place];  Service: General;  Laterality: Bilateral;  . PORTACATH PLACEMENT Right 07/22/2017   Procedure: INSERTION PORT-A-CATH WITH [name];  Surgeon: [name], MD;  Location: [place];  Service: General;  Laterality: Right;    There were no vitals filed for this visit.  Subjective Assessment - 07/08/18 1305    Subjective  Slowly getting my Rt shoulder ROM back. I stopped taking Ambien because my doctor wants me off it so I've started trying 10 mg of Melatonin.     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 It measures 5.5 cm and is located in the upper outer  quadrant with a Ki67 of 12%. , Bilateral mastectomies on 07/22/17 with SLNB on R with 2 to 3 additional nodes taken, pt has to complete chemo and radiation and will begin chemo on Aug 29, 2017    Patient Stated Goals  increase ROM of the shoulder more     Currently in Pain?  No/denies                       [facility] Adult PT Treatment/Exercise - 07/08/18 0001      Manual Therapy   Myofascial Release  To Rt axilla throughout P/ROM; also UE pulling    Passive ROM  to Rt shoulder to tolerance with flexion, abduction, and er all to pts tolerance                  PT Long Term Goals - 07/01/18 1306      PT LONG TERM GOAL #1   Title  Pt will report a 75% improvement in numbness in right hand to allow improved comfort    Baseline  90% improvement reported at this time-07/01/18    Status  Achieved      PT LONG TERM GOAL #2   Title  Pt will be independent in a home exercise program for continued strengthening and stretching to  decrease right hand/wrist discomfort    Status  Partially Met      PT LONG TERM GOAL #3   Title  Pt will demonstrate no cording in right axilla to decrease tightness with right shoulder ROM    Baseline  2 cords palpable and visible with abduction; no longer present-07/01/18    Status  Achieved      PT LONG TERM GOAL #5   Title  Pt will improve resting pectoralis tightness to minimal     Status  Achieved            Plan - 07/08/18 1508    Clinical Impression Statement  Pt has just 2 more sessions after today. Continuing to focus on manual therapy for end ROM of Rt shoulder and to decrease myofascial tightness at axilla. Pt is indeoendent with her HEP at this time.     Rehab Potential  Good    Clinical Impairments Affecting Rehab Potential  hx of radiation    PT Frequency  2x / week    PT Duration  4 weeks    PT Treatment/Interventions  ADLs/Self Care Home Management;Therapeutic activities;Therapeutic exercise;Patient/family education;Manual  techniques;Scar mobilization;Passive range of motion;Taping;Iontophoresis 41m/ml Dexamethasone    PT Next Visit Plan  Cont with manual therapy focusing on end ROM; D/C next week per POC. Remeasure A/ROM.    Consulted and Agree with Plan of Care  Patient       Patient will benefit from skilled therapeutic intervention in order to improve the following deficits and impairments:  Pain, Impaired sensation, Increased fascial restricitons, Decreased scar mobility, Postural dysfunction  Visit Diagnosis: Malignant neoplasm of upper-outer quadrant of right breast in female, estrogen receptor positive (HCC)  Stiffness of right shoulder, not elsewhere classified  Abnormal posture  Disorder of the skin and subcutaneous tissue related to radiation, unspecified  Aftercare following surgery for neoplasm     Problem List Patient Active Problem List   Diagnosis Date Noted  . Multifocal pneumonia 12/31/2017  . Fever due to infection   . Hypoxia   . Influenza A 12/29/2017  . Lactic acidosis 12/28/2017  . Lung mass 12/28/2017  . Diarrhea 12/28/2017  . Sinus tachycardia 12/28/2017  . Sepsis ([place] 09/04/2017  . Port-A-Cath in place 08/28/2017  . Genetic testing 06/25/2017  . Family history of breast cancer   . Family history of colon cancer   . Malignant neoplasm of upper-outer quadrant of right breast in female, estrogen receptor positive ([place] 06/16/2017    ROtelia Limes PTA 07/08/2018, 3:12 PM  [facility] [address]Phone: [phone]  Fax:  [phone] Name: ATaleia SadowskiMRN: [id number] of Birth: 309-02-1976

## 2018-07-10 ENCOUNTER — Ambulatory Visit: Payer: BLUE CROSS/BLUE SHIELD | Admitting: Rehabilitation

## 2018-07-10 ENCOUNTER — Encounter: Payer: Self-pay | Admitting: Rehabilitation

## 2018-07-10 DIAGNOSIS — M25611 Stiffness of right shoulder, not elsewhere classified: Secondary | ICD-10-CM

## 2018-07-10 DIAGNOSIS — C50411 Malignant neoplasm of upper-outer quadrant of right female breast: Secondary | ICD-10-CM | POA: Diagnosis not present

## 2018-07-10 DIAGNOSIS — M6281 Muscle weakness (generalized): Secondary | ICD-10-CM | POA: Diagnosis not present

## 2018-07-10 DIAGNOSIS — R293 Abnormal posture: Secondary | ICD-10-CM | POA: Diagnosis not present

## 2018-07-10 DIAGNOSIS — Z170 Estrogen receptor positive status [ER+]: Secondary | ICD-10-CM | POA: Diagnosis not present

## 2018-07-10 DIAGNOSIS — Z483 Aftercare following surgery for neoplasm: Secondary | ICD-10-CM

## 2018-07-10 DIAGNOSIS — L599 Disorder of the skin and subcutaneous tissue related to radiation, unspecified: Secondary | ICD-10-CM

## 2018-07-10 DIAGNOSIS — M25531 Pain in right wrist: Secondary | ICD-10-CM | POA: Diagnosis not present

## 2018-07-10 NOTE — Therapy (Signed)
[place], [place], [address] Phone: [phone]   Fax:  [phone]  Physical Therapy Treatment  Patient Details  Name: Sheila [name] MRN: [id number] Date of Birth: 04-13-1976 Referring Provider: Burr Medico   Encounter Date: 07/10/2018  PT End of Session - 07/10/18 1055    Visit Number  15    Number of Visits  16    Date for PT Re-Evaluation  07/13/18    PT Start Time  1962    PT Stop Time  1053    PT Time Calculation (min)  38 min    Activity Tolerance  Patient tolerated treatment well    Behavior During Therapy  [facility] for tasks assessed/performed       Past Medical History:  Diagnosis Date  . Anxiety   . Cancer [facility])    right breast   . Family history of breast cancer   . Family history of colon cancer   . GERD (gastroesophageal reflux disease)   . Headache    couple of migraines in the past  . History of kidney stones   . Vitamin D deficiency     Past Surgical History:  Procedure Laterality Date  . egg donation    . kidney stone removal    . MASTECTOMY W/ SENTINEL NODE BIOPSY Bilateral 07/22/2017   Procedure: RIGHT TOTAL MASTECTOMY WITH RIGHT AXILLARY SENTINEL LYMPH NODE BIOPSY, LEFT PROPHYLACTIC MASTECTOMY;  Surgeon: [name], MD;  Location: [place];  Service: General;  Laterality: Bilateral;  . PORTACATH PLACEMENT Right 07/22/2017   Procedure: INSERTION PORT-A-CATH WITH [name];  Surgeon: [name], MD;  Location: [place];  Service: General;  Laterality: Right;    There were no vitals filed for this visit.  Subjective Assessment - 07/10/18 1053    Subjective  I have been getting alot of motion lately.      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 It measures 5.5 cm and is located in the upper outer quadrant with a Ki67 of 12%. , Bilateral mastectomies on 07/22/17 with SLNB on R with 2 to 3  additional nodes taken, pt has to complete chemo and radiation and will begin chemo on Aug 29, 2017    Currently in Pain?  No/denies                       [facility] Adult PT Treatment/Exercise - 07/10/18 0001      Exercises   Exercises  Shoulder      Shoulder Exercises: Pulleys   Flexion  2 minutes      Shoulder Exercises: Stretch   Other Shoulder Stretches  open book sidelying stretch 5" x 10, sidelying abduction x 10      Manual Therapy   Myofascial Release  To Rt axilla throughout P/ROM; also UE pulling    Passive ROM  to Rt shoulder to tolerance with flexion, abduction, and er all to pts tolerance                  PT Long Term Goals - 07/10/18 1057      PT LONG TERM GOAL #1   Title  Pt will report a 75% improvement in numbness in right hand to allow improved comfort    Status  Achieved      PT LONG TERM GOAL #2   Title  Pt will be independent in a home  exercise program for continued strengthening and stretching to decrease right hand/wrist discomfort    Status  Achieved      PT LONG TERM GOAL #3   Title  Pt will demonstrate no cording in right axilla to decrease tightness with right shoulder ROM    Status  Achieved      PT LONG TERM GOAL #4   Title  Pt will improve Rt shoulder AROM to 160 flexion and abduction     Status  On-going      PT LONG TERM GOAL #5   Title  Pt will improve resting pectoralis tightness to minimal     Status  Achieved            Plan - 07/10/18 1055    Clinical Impression Statement  Sheila [name] was able to touch the table with her open book stretch today which is greatly improved from last session with PT.  She wants to complete her 2 more scheduled visits for maximal ROM and mobility.  Her abduction was WNL today with flexion limited mainly by radiation fibrosis at the anterior chest wall.      PT Treatment/Interventions  ADLs/Self Care Home Management;Therapeutic activities;Therapeutic exercise;Patient/family  education;Manual techniques;Scar mobilization;Passive range of motion;Taping;Iontophoresis 40m/ml Dexamethasone    PT Next Visit Plan  Cont with manual therapy focusing on end ROM; D/C next week per POC. Remeasure A/ROM.       Patient will benefit from skilled therapeutic intervention in order to improve the following deficits and impairments:  Pain, Impaired sensation, Increased fascial restricitons, Decreased scar mobility, Postural dysfunction  Visit Diagnosis: Malignant neoplasm of upper-outer quadrant of right breast in female, estrogen receptor positive (HCC)  Stiffness of right shoulder, not elsewhere classified  Disorder of the skin and subcutaneous tissue related to radiation, unspecified  Aftercare following surgery for neoplasm     Problem List Patient Active Problem List   Diagnosis Date Noted  . Multifocal pneumonia 12/31/2017  . Fever due to infection   . Hypoxia   . Influenza A 12/29/2017  . Lactic acidosis 12/28/2017  . Lung mass 12/28/2017  . Diarrhea 12/28/2017  . Sinus tachycardia 12/28/2017  . Sepsis ([place] 09/04/2017  . Port-A-Cath in place 08/28/2017  . Genetic testing 06/25/2017  . Family history of breast cancer   . Family history of colon cancer   . Malignant neoplasm of upper-outer quadrant of right breast in female, estrogen receptor positive ([place] 06/16/2017    KShan Levans PT 07/10/2018, 10:58 AM  [place] [address]Phone: [phone]  Fax:  [phone] Name: ASherman LipumaMRN: [id number] of Birth: [birth year]

## 2018-07-13 ENCOUNTER — Other Ambulatory Visit: Payer: Self-pay

## 2018-07-13 ENCOUNTER — Ambulatory Visit (HOSPITAL_COMMUNITY)
Admission: RE | Admit: 2018-07-13 | Discharge: 2018-07-13 | Disposition: A | Discharge status: Home / Self Care | Payer: BLUE CROSS/BLUE SHIELD | Source: Ambulatory Visit | Attending: Family Medicine | Admitting: Family Medicine

## 2018-07-13 ENCOUNTER — Ambulatory Visit (HOSPITAL_BASED_OUTPATIENT_CLINIC_OR_DEPARTMENT_OTHER)
Admission: RE | Admit: 2018-07-13 | Discharge: 2018-07-13 | Disposition: A | Discharge status: Home / Self Care | Payer: BLUE CROSS/BLUE SHIELD | Source: Ambulatory Visit | Attending: Cardiology | Admitting: Cardiology

## 2018-07-13 ENCOUNTER — Encounter (HOSPITAL_COMMUNITY): Payer: Self-pay | Admitting: Cardiology

## 2018-07-13 VITALS — BP 96/62 | HR 105 | Wt 160.0 lb

## 2018-07-13 DIAGNOSIS — Z79899 Other long term (current) drug therapy: Secondary | ICD-10-CM | POA: Diagnosis not present

## 2018-07-13 DIAGNOSIS — I5030 Unspecified diastolic (congestive) heart failure: Secondary | ICD-10-CM

## 2018-07-13 DIAGNOSIS — R000 Tachycardia, unspecified: Secondary | ICD-10-CM | POA: Diagnosis not present

## 2018-07-13 DIAGNOSIS — C50411 Malignant neoplasm of upper-outer quadrant of right female breast: Secondary | ICD-10-CM | POA: Diagnosis not present

## 2018-07-13 DIAGNOSIS — I428 Other cardiomyopathies: Secondary | ICD-10-CM | POA: Diagnosis not present

## 2018-07-13 DIAGNOSIS — Z170 Estrogen receptor positive status [ER+]: Secondary | ICD-10-CM | POA: Diagnosis not present

## 2018-07-13 DIAGNOSIS — I429 Cardiomyopathy, unspecified: Secondary | ICD-10-CM | POA: Insufficient documentation

## 2018-07-13 DIAGNOSIS — I509 Heart failure, unspecified: Secondary | ICD-10-CM | POA: Insufficient documentation

## 2018-07-13 NOTE — Progress Notes (Signed)
  Echocardiogram 2D Echocardiogram has been performed.  Sheila [name] 07/13/2018, 11:47 AM

## 2018-07-13 NOTE — Patient Instructions (Signed)
Echocardiogram and Follow up in 2 months.

## 2018-07-14 NOTE — Progress Notes (Signed)
Oncology: Dr. Burr Medico Cardiology: Dr. Aundra Dubin  42 y.o. with history of breath cancer was referred by Dr. Burr Medico for cardio-oncology evaluation given planned Herceptin use.  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  She had bilateral mastectomies and axillary node biopsy in 7/18.  Planned for Taxol/carboplatin/Herceptin/Perjeta q 3 wks x 6 cycles starting 10/18, then maintenance Herceptin to complete a year.  She finished the initial chemotherapy and has finished radiation.   No history of cardiac problems.  Nonsmoker.   Echo in 12/18 showed significantly less negative strain though EF was preserved.  I had her stop Herceptin for a month and started Coreg 3.125 mg bid. Echo in 1/19 showed improved strain so Herceptin was restarted.  Echo in 5/19 showed fall in EF to 45-50% so Herceptin was held and she was started on Coreg and losartan. Echo in 7/19 showed that EF remained in the 45-50% range.   She returns for followup of cardiomyopathy.  BP is low and has prevented any further uptitration of her cardiac meds.  She has occasional lightheadedness with standing but not marked.  No falls.  No exertional dyspnea, no orthopnea/PND.  No chest pain.   Labs (5/19): K 3.8, creatinine 0.84 => 0.94 Labs (7/19): creatinine 1.02  PMH: 1. Breast cancer: Diagnosed 7/18 on right, ER+/PR+/HER2+.  She had bilateral mastectomies and axillary node biopsy in 7/18.  Planned for Taxol/carboplatin/Herceptin/Perjeta q 3 wks x 6 cycles starting 10/12, then maintenance Herceptin to complete a year.  2. Herceptin-related cardiomyopathy:  - Echo (9/18): EF 28-41%, normal diastolic function, GLS -32.4%.  - Echo (12/18): EF 40-10%, normal diastolic function, normal RV size and systolic function, GLS -27.2%.  - Echo (1/19): EF 60%, GLS -15%, normal RV size and systolic function.  - Echo (5/19): EF 45-50%, GLS -17.2% - Echo (7/19): EF 45-50%, GLS -16% - Echo (8/19): EF 55-60%, GLS -19.4%  Family History  Problem  Relation Age of Onset  . Breast cancer Paternal Grandmother 93  . Colon cancer Paternal Grandmother 50  . Breast cancer Mother 35  . Prostate cancer Paternal Grandfather        dx in his 58s  . Leukemia Paternal Grandfather   . Lung cancer Maternal Uncle        heavy smoker  . Other Paternal Uncle        farm accident  . Stroke Maternal Grandfather    Social History   Socioeconomic History  . Marital status: Single    Spouse name: Not on file  . Number of children: Not on file  . Years of education: Not on file  . Highest education level: Not on file  Occupational History  . Not on file  Social Needs  . Financial resource strain: Not on file  . Food insecurity:    Worry: Not on file    Inability: Not on file  . Transportation needs:    Medical: Not on file    Non-medical: Not on file  Tobacco Use  . Smoking status: Never Smoker  . Smokeless tobacco: Former Network engineer and Sexual Activity  . Alcohol use: Yes    Comment: once a year  . Drug use: No  . Sexual activity: Yes  Lifestyle  . Physical activity:    Days per week: Not on file    Minutes per session: Not on file  . Stress: Not on file  Relationships  . Social connections:    Talks on phone: Not on file  Gets together: Not on file    Attends religious service: Not on file    Active member of club or organization: Not on file    Attends meetings of clubs or organizations: Not on file    Relationship status: Not on file  . Intimate partner violence:    Fear of current or ex partner: Not on file    Emotionally abused: Not on file    Physically abused: Not on file    Forced sexual activity: Not on file  Other Topics Concern  . Not on file  Social History Narrative  . Not on file   ROS: All systems reviewed and negative except as per HPI.  Current Outpatient Medications  Medication Sig Dispense Refill  . carvedilol (COREG) 3.125 MG tablet TAKE 1 TABLET BY MOUTH 2 TIMES DAILY. 60 tablet 3  .  Cholecalciferol (VITAMIN D) 2000 units tablet Take 2,000 Units by mouth daily.    . diphenoxylate-atropine (LOMOTIL) 2.5-0.025 MG tablet Take 1-2 tablets by mouth 4 (four) times daily as needed for diarrhea or loose stools. 30 tablet 0  . letrozole (FEMARA) 2.5 MG tablet Take 1 tablet (2.5 mg total) by mouth daily. 30 tablet 3  . levothyroxine (SYNTHROID, LEVOTHROID) 25 MCG tablet Take 25 mcg by mouth daily before breakfast.    . lidocaine-prilocaine (EMLA) cream Apply to affected area once (Patient taking differently: Apply 1 application topically once as needed (port access). Apply to affected area once) 30 g 3  . losartan (COZAAR) 25 MG tablet Take 0.5 tablets (12.5 mg total) by mouth at bedtime. 15 tablet 3  . Melatonin 10 MG TABS Take by mouth.    . naproxen sodium (ANAPROX) 220 MG tablet Take 220 mg 2 (two) times daily as needed by mouth (pain).     . prochlorperazine (COMPAZINE) 10 MG tablet Take 1 tablet (10 mg total) by mouth every 6 (six) hours as needed (Nausea or vomiting). 30 tablet 1  . venlafaxine XR (EFFEXOR XR) 150 MG 24 hr capsule Take 1 capsule (150 mg total) by mouth daily with breakfast. 30 capsule 3  . fluticasone (FLONASE) 50 MCG/ACT nasal spray Place 1 spray into both nostrils daily.     Current Facility-Administered Medications  Medication Dose Route Frequency Provider Last Rate Last Dose  . gi cocktail (Maalox,Lidocaine,Donnatal)  30 mL Oral Once Harle Stanford., PA-C       BP 96/62   Pulse (!) 105   Wt 72.6 kg (160 lb)   SpO2 100%   BMI 25.82 kg/m  General: NAD Neck: No JVD, no thyromegaly or thyroid nodule.  Lungs: Clear to auscultation bilaterally with normal respiratory effort. CV: Nondisplaced PMI.  Heart regular S1/S2, no S3/S4, no murmur.  No peripheral edema.  No carotid bruit.  Normal pedal pulses.  Abdomen: Soft, nontender, no hepatosplenomegaly, no distention.  Skin: Intact without lesions or rashes.  Neurologic: Alert and oriented x 3.  Psych: Normal  affect. Extremities: No clubbing or cyanosis.  HEENT: Normal.   Assessment/Plan:  1. Breast cancer: 1 year of Herceptin had been planned.  Her initial echo was normal. 12/18 echo showed that EF remained 60-65% but global longitudinal strain measurement was significantly less negative, down to -13.5% from -20.2%.  I had her stop Herceptin for a month and she had a repeat echo in 1/19 with stable EF and more negative strain, Herceptin was restarted. In 5/19, EF was down to 45-50% so Herceptin held.  Repeat echo in 7/19 showed EF  still 45-50%.  I suspect this was related to Herceptin.  She has now been off Herceptin since 5/19 and today's echo was reviewed, showing clear improvement with EF up to 55-60% and improved strain.  - Patient says she has 3 doses of Herceptin remaining.  I think it would be reasonable to have her take the 3 remaining doses.  She will need to continue current Coreg and losartan (no BP room to titrate) and will get repeat echo in 2 months.   2. Sinus tachycardia: This may be related to anxiety.  HR up in the office currently, but HR not elevated when she was getting the echo.    Followup in 2 months with echo.   Loralie Champagne 07/14/2018

## 2018-07-15 ENCOUNTER — Ambulatory Visit: Payer: BLUE CROSS/BLUE SHIELD

## 2018-07-15 ENCOUNTER — Telehealth: Payer: Self-pay | Admitting: *Deleted

## 2018-07-15 DIAGNOSIS — Z483 Aftercare following surgery for neoplasm: Secondary | ICD-10-CM

## 2018-07-15 DIAGNOSIS — C50411 Malignant neoplasm of upper-outer quadrant of right female breast: Secondary | ICD-10-CM | POA: Diagnosis not present

## 2018-07-15 DIAGNOSIS — L599 Disorder of the skin and subcutaneous tissue related to radiation, unspecified: Secondary | ICD-10-CM | POA: Diagnosis not present

## 2018-07-15 DIAGNOSIS — M6281 Muscle weakness (generalized): Secondary | ICD-10-CM | POA: Diagnosis not present

## 2018-07-15 DIAGNOSIS — R293 Abnormal posture: Secondary | ICD-10-CM | POA: Diagnosis not present

## 2018-07-15 DIAGNOSIS — M25531 Pain in right wrist: Secondary | ICD-10-CM | POA: Diagnosis not present

## 2018-07-15 DIAGNOSIS — M25611 Stiffness of right shoulder, not elsewhere classified: Secondary | ICD-10-CM | POA: Diagnosis not present

## 2018-07-15 DIAGNOSIS — Z170 Estrogen receptor positive status [ER+]: Secondary | ICD-10-CM | POA: Diagnosis not present

## 2018-07-15 NOTE — Therapy (Addendum)
[facility] [address] [place], [place], [address] Phone: [phone]   Fax:  [phone]  Physical Therapy Treatment  Patient Details  Name: Sheila [name] MRN: [id number] Date of Birth: 05/04/1976 Referring Provider: Feng   Encounter Date: 07/15/2018  PT End of Session - 07/15/18 1347    Visit Number  16    Number of Visits  16    Date for PT Re-Evaluation  07/13/18    PT Start Time  1300    PT Stop Time  1344    PT Time Calculation (min)  44 min    Activity Tolerance  Patient tolerated treatment well    Behavior During Therapy  WFL for tasks assessed/performed       Past Medical History:  Diagnosis Date  . Anxiety   . Cancer (HCC)    right breast   . Family history of breast cancer   . Family history of colon cancer   . GERD (gastroesophageal reflux disease)   . Headache    couple of migraines in the past  . History of kidney stones   . Vitamin D deficiency     Past Surgical History:  Procedure Laterality Date  . egg donation    . kidney stone removal    . MASTECTOMY W/ SENTINEL NODE BIOPSY Bilateral 07/22/2017   Procedure: RIGHT TOTAL MASTECTOMY WITH RIGHT AXILLARY SENTINEL LYMPH NODE BIOPSY, LEFT PROPHYLACTIC MASTECTOMY;  Surgeon: Wakefield, Matthew, MD;  Location: [facility] OR;  Service: General;  Laterality: Bilateral;  . PORTACATH PLACEMENT Right 07/22/2017   Procedure: INSERTION PORT-A-CATH WITH US;  Surgeon: Wakefield, Matthew, MD;  Location: [facility] OR;  Service: General;  Laterality: Right;    There were no vitals filed for this visit.  Subjective Assessment - 07/15/18 1303    Subjective  I'm doing well and ready to make today my last visit.     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 It measures 5.5 cm and is located in the upper outer quadrant with a Ki67 of 12%. , Bilateral mastectomies on 07/22/17 with SLNB on R with 2  to 3 additional nodes taken, pt has to complete chemo and radiation and will begin chemo on Aug 29, 2017    Patient Stated Goals  increase ROM of the shoulder more     Currently in Pain?  No/denies         [facility] PT Assessment - 07/15/18 0001      AROM   Right Shoulder Flexion  150 Degrees    Right Shoulder ABduction  170 Degrees                   [facility] Adult PT Treatment/Exercise - 07/15/18 0001      Manual Therapy   Myofascial Release  To Rt axilla and chest throughout P/ROM; also UE pulling    Passive ROM  to Rt shoulder to tolerance with flexion, abduction, and D2 all to pts tolerance                  PT Long Term Goals - 07/15/18 1301      PT LONG TERM GOAL #1   Title  Pt will report a 75% improvement in numbness in right hand to allow improved comfort    Baseline  90% improvement reported at this time-07/01/18    Status  Achieved      PT LONG TERM GOAL #  2   Title  Pt will be independent in a home exercise program for continued strengthening and stretching to decrease right hand/wrist discomfort    Status  Achieved      PT LONG TERM GOAL #3   Title  Pt will demonstrate no cording in right axilla to decrease tightness with right shoulder ROM    Baseline  2 cords palpable and visible with abduction; no longer present-07/01/18    Status  Achieved      PT LONG TERM GOAL #4   Title  Pt will improve Rt shoulder AROM to 160 flexion and abduction     Baseline  Rt flexion 150 and abduction 170 degrees- 07/15/18    Status  Partially Met      PT LONG TERM GOAL #5   Title  Pt will improve resting pectoralis tightness to minimal     Baseline  60% improvement with this-07/15/18    Status  Achieved            Plan - 07/15/18 1348    Clinical Impression Statement  Pt has done very well with this episode of care, has met all goals, except lacking 10 degrees of flexion from goal, and feels ready for D/C at this time.     Rehab Potential  Good    Clinical  Impairments Affecting Rehab Potential  hx of radiation    PT Frequency  2x / week    PT Duration  4 weeks    PT Treatment/Interventions  ADLs/Self Care Home Management;Therapeutic activities;Therapeutic exercise;Patient/family education;Manual techniques;Scar mobilization;Passive range of motion;Taping;Iontophoresis 4mg/ml Dexamethasone    PT Next Visit Plan  D/C this visit.    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 Multifocal pneumonia 12/31/2017  . Fever due to infection   . Hypoxia   . Influenza A 12/29/2017  . Lactic acidosis 12/28/2017  . Lung mass 12/28/2017  . Diarrhea 12/28/2017  . Sinus tachycardia 12/28/2017  . Sepsis (HCC) 09/04/2017  . Port-A-Cath in place 08/28/2017  . Genetic testing 06/25/2017  . Family history of breast cancer   . Family history of colon cancer   . Malignant neoplasm of upper-outer quadrant of right breast in female, estrogen receptor positive (HCC) 06/16/2017    Rosenberger, Valerie Ann, PTA 07/15/2018, 1:53 PM  [facility] [address] [name], [address] Phone: [phone]   Fax:  [phone]  Name: [name] MRN: [id number] Date of Birth: 01/03/1976 PHYSICAL THERAPY DISCHARGE SUMMARY  Visits from Start of Care: 16  Current functional level related to goals /  functional outcomes: Ready for D/C   Remaining deficits: Need for continued exercise   Education / Equipment: HEP Plan: Patient agrees to discharge.  Patient goals were met. Patient is being discharged due to meeting the stated rehab goals.  ?????    Kara Tevis, PT   

## 2018-07-15 NOTE — Telephone Encounter (Signed)
ROI faxed to [facility] - att: Blanca Friend; release [phone]

## 2018-07-17 ENCOUNTER — Inpatient Hospital Stay: Payer: BLUE CROSS/BLUE SHIELD

## 2018-07-17 ENCOUNTER — Ambulatory Visit: Payer: BLUE CROSS/BLUE SHIELD | Admitting: Rehabilitation

## 2018-07-17 ENCOUNTER — Telehealth: Payer: Self-pay | Admitting: Hematology

## 2018-07-17 ENCOUNTER — Inpatient Hospital Stay (HOSPITAL_BASED_OUTPATIENT_CLINIC_OR_DEPARTMENT_OTHER): Payer: BLUE CROSS/BLUE SHIELD | Admitting: Hematology

## 2018-07-17 ENCOUNTER — Encounter: Payer: Self-pay | Admitting: Hematology

## 2018-07-17 VITALS — BP 98/63 | HR 107 | Temp 98.7°F | Resp 18 | Ht 66.0 in | Wt 160.1 lb

## 2018-07-17 DIAGNOSIS — Z170 Estrogen receptor positive status [ER+]: Principal | ICD-10-CM

## 2018-07-17 DIAGNOSIS — E7800 Pure hypercholesterolemia, unspecified: Secondary | ICD-10-CM | POA: Diagnosis not present

## 2018-07-17 DIAGNOSIS — M542 Cervicalgia: Secondary | ICD-10-CM | POA: Diagnosis not present

## 2018-07-17 DIAGNOSIS — Z803 Family history of malignant neoplasm of breast: Secondary | ICD-10-CM

## 2018-07-17 DIAGNOSIS — C773 Secondary and unspecified malignant neoplasm of axilla and upper limb lymph nodes: Secondary | ICD-10-CM | POA: Diagnosis not present

## 2018-07-17 DIAGNOSIS — R040 Epistaxis: Secondary | ICD-10-CM

## 2018-07-17 DIAGNOSIS — Z79899 Other long term (current) drug therapy: Secondary | ICD-10-CM

## 2018-07-17 DIAGNOSIS — M549 Dorsalgia, unspecified: Secondary | ICD-10-CM

## 2018-07-17 DIAGNOSIS — R6884 Jaw pain: Secondary | ICD-10-CM | POA: Diagnosis not present

## 2018-07-17 DIAGNOSIS — Z793 Long term (current) use of hormonal contraceptives: Secondary | ICD-10-CM

## 2018-07-17 DIAGNOSIS — G4700 Insomnia, unspecified: Secondary | ICD-10-CM

## 2018-07-17 DIAGNOSIS — Z806 Family history of leukemia: Secondary | ICD-10-CM

## 2018-07-17 DIAGNOSIS — Z800 Family history of malignant neoplasm of digestive organs: Secondary | ICD-10-CM

## 2018-07-17 DIAGNOSIS — Z8042 Family history of malignant neoplasm of prostate: Secondary | ICD-10-CM

## 2018-07-17 DIAGNOSIS — Z5112 Encounter for antineoplastic immunotherapy: Secondary | ICD-10-CM | POA: Diagnosis not present

## 2018-07-17 DIAGNOSIS — Z9013 Acquired absence of bilateral breasts and nipples: Secondary | ICD-10-CM | POA: Diagnosis not present

## 2018-07-17 DIAGNOSIS — R232 Flushing: Secondary | ICD-10-CM | POA: Diagnosis not present

## 2018-07-17 DIAGNOSIS — C50411 Malignant neoplasm of upper-outer quadrant of right female breast: Secondary | ICD-10-CM

## 2018-07-17 DIAGNOSIS — F419 Anxiety disorder, unspecified: Secondary | ICD-10-CM

## 2018-07-17 DIAGNOSIS — Z95828 Presence of other vascular implants and grafts: Secondary | ICD-10-CM

## 2018-07-17 DIAGNOSIS — Z87442 Personal history of urinary calculi: Secondary | ICD-10-CM

## 2018-07-17 DIAGNOSIS — M5137 Other intervertebral disc degeneration, lumbosacral region: Secondary | ICD-10-CM

## 2018-07-17 DIAGNOSIS — R12 Heartburn: Secondary | ICD-10-CM | POA: Diagnosis not present

## 2018-07-17 DIAGNOSIS — E559 Vitamin D deficiency, unspecified: Secondary | ICD-10-CM

## 2018-07-17 DIAGNOSIS — G629 Polyneuropathy, unspecified: Secondary | ICD-10-CM | POA: Diagnosis not present

## 2018-07-17 DIAGNOSIS — Z801 Family history of malignant neoplasm of trachea, bronchus and lung: Secondary | ICD-10-CM

## 2018-07-17 LAB — CBC WITH DIFFERENTIAL/PLATELET
Basophils Absolute: 0 10*3/uL (ref 0.0–0.1)
Basophils Relative: 1 %
Eosinophils Absolute: 0.1 10*3/uL (ref 0.0–0.5)
Eosinophils Relative: 4 %
HEMATOCRIT: 33 % — AB (ref 34.8–46.6)
Hemoglobin: 10.9 g/dL — ABNORMAL LOW (ref 11.6–15.9)
LYMPHS ABS: 0.7 10*3/uL — AB (ref 0.9–3.3)
Lymphocytes Relative: 25 %
MCH: 30.8 pg (ref 25.1–34.0)
MCHC: 33 g/dL (ref 31.5–36.0)
MCV: 93.5 fL (ref 79.5–101.0)
MONOS PCT: 7 %
Monocytes Absolute: 0.2 10*3/uL (ref 0.1–0.9)
NEUTROS ABS: 1.8 10*3/uL (ref 1.5–6.5)
NEUTROS PCT: 63 %
Platelets: 170 10*3/uL (ref 145–400)
RBC: 3.53 MIL/uL — AB (ref 3.70–5.45)
RDW: 12.5 % (ref 11.2–14.5)
WBC: 2.8 10*3/uL — AB (ref 3.9–10.3)

## 2018-07-17 LAB — COMPREHENSIVE METABOLIC PANEL
ALK PHOS: 96 U/L (ref 38–126)
ALT: 11 U/L (ref 0–44)
AST: 11 U/L — ABNORMAL LOW (ref 15–41)
Albumin: 3.4 g/dL — ABNORMAL LOW (ref 3.5–5.0)
Anion gap: 9 (ref 5–15)
BILIRUBIN TOTAL: 0.3 mg/dL (ref 0.3–1.2)
BUN: 13 mg/dL (ref 6–20)
CALCIUM: 9.2 mg/dL (ref 8.9–10.3)
CO2: 25 mmol/L (ref 22–32)
CREATININE: 0.96 mg/dL (ref 0.44–1.00)
Chloride: 106 mmol/L (ref 98–111)
GFR calc Af Amer: >60 mL/min (ref >60)
GFR calc non Af Amer: >60 mL/min (ref >60)
GLUCOSE: 146 mg/dL — AB (ref 70–99)
Potassium: 3.9 mmol/L (ref 3.5–5.1)
Sodium: 140 mmol/L (ref 135–145)
TOTAL PROTEIN: 6.8 g/dL (ref 6.5–8.1)

## 2018-07-17 MED ORDER — GOSERELIN ACETATE 3.6 MG ~~LOC~~ IMPL
3.6000 mg | DRUG_IMPLANT | Freq: Once | SUBCUTANEOUS | Status: AC
  Administered 2018-07-17: 3.6 mg via SUBCUTANEOUS

## 2018-07-17 MED ORDER — GOSERELIN ACETATE 3.6 MG ~~LOC~~ IMPL
DRUG_IMPLANT | SUBCUTANEOUS | Status: AC
  Filled 2018-07-17: qty 3.6

## 2018-07-17 MED ORDER — DIPHENHYDRAMINE HCL 25 MG PO CAPS
ORAL_CAPSULE | ORAL | Status: AC
  Filled 2018-07-17: qty 2

## 2018-07-17 MED ORDER — HEPARIN SOD (PORK) LOCK FLUSH 100 UNIT/ML IV SOLN
500.0000 [IU] | Freq: Once | INTRAVENOUS | Status: AC | PRN
  Administered 2018-07-17: 500 [IU]
  Filled 2018-07-17: qty 5

## 2018-07-17 MED ORDER — SODIUM CHLORIDE 0.9 % IV SOLN
420.0000 mg | Freq: Once | INTRAVENOUS | Status: AC
  Administered 2018-07-17: 420 mg via INTRAVENOUS
  Filled 2018-07-17: qty 14

## 2018-07-17 MED ORDER — DIPHENHYDRAMINE HCL 25 MG PO CAPS
50.0000 mg | ORAL_CAPSULE | Freq: Once | ORAL | Status: AC
  Administered 2018-07-17: 50 mg via ORAL

## 2018-07-17 MED ORDER — VENLAFAXINE HCL ER 75 MG PO CP24: 75.0000 mg | ORAL_CAPSULE | Freq: Every day | ORAL | 2 refills | Status: DC

## 2018-07-17 MED ORDER — SODIUM CHLORIDE 0.9% FLUSH
10.0000 mL | Freq: Once | INTRAVENOUS | Status: AC
  Administered 2018-07-17: 10 mL
  Filled 2018-07-17: qty 10

## 2018-07-17 MED ORDER — SODIUM CHLORIDE 0.9 % IV SOLN
Freq: Once | INTRAVENOUS | Status: AC
  Administered 2018-07-17: 12:00:00 via INTRAVENOUS
  Filled 2018-07-17: qty 250

## 2018-07-17 MED ORDER — TRASTUZUMAB CHEMO 150 MG IV SOLR
450.0000 mg | Freq: Once | INTRAVENOUS | Status: AC
  Administered 2018-07-17: 450 mg via INTRAVENOUS
  Filled 2018-07-17: qty 21.43

## 2018-07-17 MED ORDER — ACETAMINOPHEN 325 MG PO TABS
ORAL_TABLET | ORAL | Status: AC
  Filled 2018-07-17: qty 2

## 2018-07-17 MED ORDER — SODIUM CHLORIDE 0.9% FLUSH
10.0000 mL | INTRAVENOUS | Status: DC | PRN
  Administered 2018-07-17: 10 mL
  Filled 2018-07-17: qty 10

## 2018-07-17 MED ORDER — ACETAMINOPHEN 325 MG PO TABS
650.0000 mg | ORAL_TABLET | Freq: Once | ORAL | Status: AC
  Administered 2018-07-17: 650 mg via ORAL

## 2018-07-17 NOTE — Telephone Encounter (Signed)
Patient already on schedule for lab/fu/tx in 3 weeks as requested per 8/30 los. Message to gynonc re referral to Dr. Denman George. [facility] radiology will contact patient re scans once orders entered.

## 2018-07-17 NOTE — Progress Notes (Signed)
[place]  [phone] [phone]  Clinic Follow Up Note   Patient Care Team: [name], MD as PCP - General (Family Medicine) [name] (Family Medicine) [name], MD as Consulting Physician (General Surgery) [name], MD as Consulting Physician (Hematology) Kyung Rudd, MD as Consulting Physician (Radiation Oncology)   Date of Service:  07/17/2018  CHIEF COMPLAINTS:  Follow up right breast cancer    Oncology History    Cancer Staging Malignant neoplasm of upper-outer quadrant of right breast in female, estrogen receptor positive ([place]) Staging form: Breast, AJCC 8th Edition - Clinical stage from 06/11/2017: Stage IB (cT3, cN0, cM0, G2, ER: Positive, PR: Positive, HER2: Positive) - Signed by [name], MD on 06/16/2017 - Pathologic stage from 07/22/2017: Stage IB (pT3(m), pN2a(sn), cM0, G2, ER: Positive, PR: Positive, HER2: Positive) - Signed by [name], NP on 08/04/2017      Malignant neoplasm of upper-outer quadrant of right breast in female, estrogen receptor positive ([place])   06/11/2017 Initial Biopsy    Diagnosis 06/11/17 Breast, right, needle core biopsy, 11:00 o'clock - INVASIVE DUCTAL CARCINOMA, G2    06/11/2017 Mammogram    [name] and [facility] Diagnostic Breast Tomo Bilateral 06/11/17 IMPRESSION: 1. Highly suspicious mass within the retroareolar right breast, extending from the 8:00 to 11:00 axes, anterior to posterior depth, extending anteriorly to the nipple with associated nipple retraction, measuring at least 5.5 cm by ultrasound  2. No sonographic evidence of metastatic lymphadenopathy in the right axilla. 3. No evidence of malignancy within the left breast.     06/11/2017 Initial Diagnosis    Malignant neoplasm of upper-outer quadrant of right breast in female, estrogen receptor positive ([place])    06/11/2017 Receptors her2    Estrogen Receptor: 100%, POSITIVE, STRONG STAINING INTENSITY Progesterone  Receptor: 100%, POSITIVE, STRONG STAINING INTENSITY Proliferation Marker Ki67: 12%Proliferation Marker Ki67: 12%  HER2 - **POSITIVE** RATIO OF HER2/CEP17 SIGNALS 2.55 AVERAGE HER2 COPY NUMBER PER CELL 4.85    06/25/2017 Genetic Testing    Negative genetic testing on the 9 gene STAT panel.  The STAT Breast cancer panel offered by [facility] includes sequencing and rearrangement analysis for the following 9 genes:  ATM, BRCA1, BRCA2, CDH1, CHEK2, PALB2, PTEN, STK11 and TP53.   The report date is June 25, 2017.  Negative genetic testing on the common hereditary cancer panel.  The Hereditary Gene Panel offered by [facility] includes sequencing and/or deletion duplication testing of the following 46 genes: APC, ATM, AXIN2, BARD1, BMPR1A, BRCA1, BRCA2, BRIP1, CDH1, CDKN2A (p14ARF), CDKN2A (p16INK4a), CHEK2, CTNNA1, DICER1, EPCAM (Deletion/duplication testing only), GREM1 (promoter region deletion/duplication testing only), KIT, MEN1, MLH1, MSH2, MSH3, MSH6, MUTYH, NBN, NF1, NHTL1, PALB2, PDGFRA, PMS2, POLD1, POLE, PTEN, RAD50, RAD51C, RAD51D, SDHB, SDHC, SDHD, SMAD4, SMARCA4. STK11, TP53, TSC1, TSC2, and VHL.  The following genes were evaluated for sequence changes only: SDHA and HOXB13 c.251G>A variant only.  The report date is June 25, 2017.     07/02/2017 Imaging    CT cAP 07/02/17 IMPRESSION: 1. Subareolar right breast mass. No compelling findings of nodal or metastatic involvement. 2. Old granulomatous disease. 3. There is a 7 mm enhancing or hyperdense focus posteriorly in segment 7 of the liver in the subcapsular region. By virtue of its small size this lesion is technically nonspecific although statistically likely to be a small benign lesions such as flash filling hemangioma. This may warrant surveillance. 4.  Prominent stool throughout the colon favors constipation. 5. Degenerative disc disease and spondylosis  at L5-S1 likely causing mild impingement.    07/02/2017 Imaging    Bone scan  07/02/17 IMPRESSION: Today' s exam is negative. I ascribed the tiny focus of activity just proximal to the left antecubital region to injection site.    07/22/2017 Surgery    RIGHT TOTAL MASTECTOMY WITH RIGHT AXILLARY SENTINEL LYMPH NODE BIOPSY, LEFT PROPHYLACTIC MASTECTOM and INSERTION PORT-A-CATH WITH [name] by [name] and [facility]     07/22/2017 Pathology Results    Diagnosis 1. Breast, simple mastectomy, Left - FIBROADENOMA. - NO MALIGNANCY IDENTIFIED. 2. 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 - ADDITIONAL FOCUS OF LOBULAR CARCINOMA, GRADE 2, SPANNING 2.2 CM. - INTERMEDIATE GRADE DUCTAL CARCINOMA IN SITU. - INVASIVE CARCINOMA COMES TO WITHIN 0.2 TO 0.3 CM OF THE DEEP MARGIN, FOCALLY. - TUMOR FOCALLY INVOLVES EPIDERMIS. - LYMPHOVASCULAR INVASION OF DERMAL LYMPHATICS. - ONE OF ONE LYMPH NODES NEGATIVE FOR CARCINOMA (0/1). - SEE ONCOLOGY TABLE. 3. Lymph node, sentinel, biopsy, Right axillary - ONE LYMPH NODE WITH ISOLATED TUMOR CELLS (0/1). 4. Lymph node, sentinel, biopsy, Right - METASTATIC CARCINOMA IN ONE OF ONE LYMPH NODES (1/1). 5. Lymph node, sentinel, biopsy, Right - ONE OF ONE LYMPH NODES NEGATIVE FOR CARCINOMA (0/1). 6. Lymph node, sentinel, biopsy, Right - METASTATIC CARCINOMA IN ONE OF ONE LYMPH NODES (1/1). - EXTRACAPSULAR EXTENSION. 7. Lymph node, sentinel, biopsy, Right - METASTATIC CARCINOMA IN ONE OF ONE LYMPH NODES (1/1). - EXTRACAPSULAR EXTENSION. 8. Lymph node, sentinel, biopsy, Right - ONE OF ONE LYMPH NODES NEGATIVE FOR CARCINOMA (0/1). 9. Lymph node, sentinel, biopsy, Right - ONE OF ONE LYMPH NODES NEGATIVE FOR CARCINOMA (0/1). 10. Lymph node, sentinel, biopsy, Right - METASTATIC CARCINOMA IN ONE OF ONE LYMPH NODES (1/1). - EXTRACAPSULAR EXTENSION. 11. Lymph node, biopsy, Right axillary - ONE OF ONE LYMPH NODES NEGATIVE FOR CARCINOMA (0/1).     07/22/2017 Receptors her2    Ductal  carcinoma in primary breast tumor and node metastasis are ER 95%, PR 95% strongly positive, and HER2 + The lobular component in primary breast tumor is ER 95% positive, PR 95% positive, strong staining, HER-2 negative.   Original biopsy [id number]: Ductal morphology, Her2 FISH positive. Current specimen: Main focus is 75% ductal morphology, 25% lobular morphology. Smaller focus is lobular morphology. #2A Lobular morphology, Her IHC negative. #2E Lobular morphology, Her2 FISH negative, Her2 IHC negative. #4 50% lobular and 50% ductal morphology, Her2 FISH positive, Her2 IHC negative. #6 Ductal morphology #7 Ductal morphology #10 50% lobular, 50% ductal morphology    08/25/2017 Imaging    MRI abdomen done at [facility]:  Enhancing structure measing 5 mm is compatible with a flash filling hemangioma. No specific findings to suggest metastatic cancer    08/25/2017 Imaging    MRI Abdomen at [facility] 08/25/17  IMPRESSION: 1. Lesion in segment 7 of the liver has signal and enhancement characteristics compatible with a flash filling hemangioma. No specific findings identified to suggest metastatic disease.    08/29/2017 - 12/12/2017 Chemotherapy    adjuvant TCHP every 3 weeks for 6 cycles starting 08/29/17, followed by maintenance Herceptin with or without Perjeta for 6-12 months. Postponed and decreased Carbo to 69m and added Onpro with cycle 2 on 09/19/17 due to neutropenic fever. Carbplatin further reduced to 5034mstarting with cycle 3 due to thrombocytopenia. Herceptin was held for Cycle 4 and 5 due to worsening global longitudinal strain found on ECHO from 11/07/17.        09/04/2017 -  09/09/2017 Hospital Admission    Admit date: 09/04/2017 Discharge date: 09/09/2017  DISCHARGE DIAGNOSES:  Principal Problem:   Sepsis [facility]) Active Problems:   Malignant neoplasm of upper-outer quadrant of right breast in female, estrogen receptor positive ([place])   Port-A-Cath in place   Neutropenic fever  ([place])   Hyponatremia   Malignant neoplasm of breast in female, estrogen receptor positive ([place])     12/28/2017 Imaging    CT CHEST IMPRESSION: 1. 2.3 x 2.7 x 1.2 cm irregular masslike opacity within the right middle lobe worrisome for malignancy/metastasis. 2. 3 new very small sclerotic lesions, within the sternum and the body of T4, worrisome for new metastases.    12/28/2017 - 01/05/2018 Hospital Admission    Admit date: 12/28/17-01/05/18 Admission diagnosis: multifocal pneumonia, influenza A, sepsis, hypoxia Additional comments: imaging during hospitalization notable for new irregular masslike opacity in the right middle lobe worrisome for metastasis and new very small sclerotic lesion within the sternum and the body of T4, worrisome for metastasis    12/29/2017 Imaging    CT ABD/PELVIS IMPRESSION: No definite acute intra-abdominal or intrapelvic abnormalities.  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    12/30/2017 Imaging    CT HEAD IMPRESSION: No acute intracranial abnormality or enhancing lesion.    02/04/2018 PET scan    IMPRESSION: 1. The previous consolidation in the right middle lobe currently has a more nodular and reticular appearance with some associated mild atelectasis along its anterior margin. This demonstrates low-grade metabolic activity with maximum SUV 3.2 (background blood pool activity is 2.7). Although conceivably related to low-grade residuum from the patient's malignancy, the presence of calcified right hilar lymph nodes and small calcifications along the nodularity raise suspicion for a low-grade active granulomatous process or atypical infectious process. Surveillance is recommended. 2. Scattered deposits of hypermetabolic but benign metabolically active brown fat.    03/20/2018 -  Anti-estrogen oral therapy    -Monthly Zoladex injection starting 03/20/18  -Letrozole 2.30m once dialy starting 05/03/18     04/06/2018  Imaging    CT Chest wo contrast IMPRESSION: 1. Stable linear nodular thickening in the RIGHT middle lobe is favored post infectious or inflammatory scarring. 2. Calcified RIGHT hilar lymph node is consistent post infectious process. 3. Stable small LEFT upper lobe nodule. 4. Stable sclerotic lesion in the manubrium. Sclerotic lesion at T3 is more prominent (4 mm). Sclerotic lesion at L 1 is new from 07/02/2017.    04/10/2018 Echocardiogram    LV EF 45%-50%    05/25/2018 Echocardiogram    LV EF 45%-50%    07/13/2018 Echocardiogram    Normal LV size with EF 55-60%    HISTORY OF PRESENTING ILLNESS: [birth year]ASuan Halter89y.o. female is here because of newly diagnosed Malignant neoplasm of upper-outer quadrant of right breast. She presents to the breast clinic today with her common law husband. She felt the lump initially when she had her physical 2 weeks ago, she had felt her nipples retracted several years ago. A few months ago it has become more pronounced. She had no pain or discharge.   In the past was diagnosed with Vitamin D deficiency and high cholesterol. She previously was on mood stabilizing medication. She feel right now she does not need that medication but is overwhelmed. She donated eggs and previously had kidney stones removed. Her mother had breast cancer and was diagnosed at 666along with paternal grandmother in her 666's   Today she has  heart burn which she is taking Prilosec and will see her GI later today. She has trouble sleeping and uses tylenol pm. She tried [place] which did not help, same with melatonin. Her common law husband Is on disability.  GYN HISTORY  Menarchal: 19 LMP: July 23rd, 2018 Contraceptive: 20-27 on birth control pill HRT: NA G0P0: does not want kids  CURRENT THERAPY:   -Herceptin and Perjeta maintenance therapy every 3 weeks. Held 03/2018-07/17/18 due to low EF%.  -Monthly Zoladex injections starting 03/20/18  -Letrozole 2.65m once daily  starting 05/03/18   INTERVAL HISTORY:  AAcelyn Bashamis here for a follow up. She presents to the clinic today accompanied by her husband. She notes she has issues with BP medication where she was borderline low. She has been taking it slow and her recovered. She notes she overall feels sore in her neck and back. She finds difficulty getting out of bed. This has been going on for the past 1-2 months. She notes she is not completely back to normal function because she gets tired easily. She notes occasional neuropathy just in her right hand.  She notes she lost her job because her last hospitalization without FMLA and now she is looking for a new job. She denies having a period since being on Zoladex injections. She has been getting recent nosebleeds with crusting in both nostrils. She has been using aquifer. This stops with applying pressure. She denies bleeding from anywhere else.  She notes jaw pain when eating hard foods. She notes she has stopped Ambien and is now on high dose melatonin. She note her hot flashes are worse and wake her up at night.      MEDICAL HISTORY:  Past Medical History:  Diagnosis Date  . Anxiety   . Cancer ([facility]    right breast   . Family history of breast cancer   . Family history of colon cancer   . GERD (gastroesophageal reflux disease)   . Headache    couple of migraines in the past  . History of kidney stones   . Vitamin D deficiency    SURGICAL HISTORY: Past Surgical History:  Procedure Laterality Date  . egg donation    . kidney stone removal    . MASTECTOMY W/ SENTINEL NODE BIOPSY Bilateral 07/22/2017   Procedure: RIGHT TOTAL MASTECTOMY WITH RIGHT AXILLARY SENTINEL LYMPH NODE BIOPSY, LEFT PROPHYLACTIC MASTECTOMY;  Surgeon: WRolm Bookbinder MD;  [place] [facility]  Service: General;  Laterality: Bilateral;  . PORTACATH PLACEMENT Right 07/22/2017   Procedure: INSERTION PORT-A-CATH WITH [place]  Surgeon: WRolm Bookbinder MD;  [place] [facility]  Service:  General;  Laterality: Right;   SOCIAL HISTORY: Social History   Socioeconomic History  . Marital status: Single    Spouse name: Not on file  . Number of children: Not on file  . Years of education: Not on file  . Highest education level: Not on file  Occupational History  . Not on file  Social Needs  . Financial resource strain: Not on file  . Food insecurity:    Worry: Not on file    Inability: Not on file  . Transportation needs:    Medical: Not on file    Non-medical: Not on file  Tobacco Use  . Smoking status: Never Smoker  . Smokeless tobacco: Former UNetwork engineerand Sexual Activity  . Alcohol use: Yes    Comment: once a year  . Drug use: No  . Sexual activity: Yes  Lifestyle  . Physical activity:    Days per week: Not on file    Minutes per session: Not on file  . Stress: Not on file  Relationships  . Social connections:    Talks on phone: Not on file    Gets together: Not on file    Attends religious service: Not on file    Active member of club or organization: Not on file    Attends meetings of clubs or organizations: Not on file    Relationship status: Not on file  . Intimate partner violence:    Fear of current or ex partner: Not on file    Emotionally abused: Not on file    Physically abused: Not on file    Forced sexual activity: Not on file  Other Topics Concern  . Not on file  Social History Narrative  . Not on file   FAMILY HISTORY: Family History  Problem Relation Age of Onset  . Breast cancer Paternal Grandmother 30  . Colon cancer Paternal Grandmother 33  . Breast cancer Mother 79  . Prostate cancer Paternal Grandfather        dx in his 58s  . Leukemia Paternal Grandfather   . Lung cancer Maternal Uncle        heavy smoker  . Other Paternal Uncle        farm accident  . Stroke Maternal Grandfather    ALLERGIES:  has No Known Allergies.  MEDICATIONS:  Current Outpatient Medications  Medication Sig Dispense Refill  .  carvedilol (COREG) 3.125 MG tablet TAKE 1 TABLET BY MOUTH 2 TIMES DAILY. 60 tablet 3  . Cholecalciferol (VITAMIN D) 2000 units tablet Take 2,000 Units by mouth daily.    . diphenoxylate-atropine (LOMOTIL) 2.5-0.025 MG tablet Take 1-2 tablets by mouth 4 (four) times daily as needed for diarrhea or loose stools. 30 tablet 0  . fluticasone (FLONASE) 50 MCG/ACT nasal spray Place 1 spray into both nostrils daily.    Marland Kitchen letrozole (FEMARA) 2.5 MG tablet Take 1 tablet (2.5 mg total) by mouth daily. 30 tablet 3  . levothyroxine (SYNTHROID, LEVOTHROID) 25 MCG tablet Take 25 mcg by mouth daily before breakfast.    . lidocaine-prilocaine (EMLA) cream Apply to affected area once (Patient taking differently: Apply 1 application topically once as needed (port access). Apply to affected area once) 30 g 3  . losartan (COZAAR) 25 MG tablet Take 0.5 tablets (12.5 mg total) by mouth at bedtime. 15 tablet 3  . Melatonin 10 MG TABS Take by mouth.    . naproxen sodium (ANAPROX) 220 MG tablet Take 220 mg 2 (two) times daily as needed by mouth (pain).     Marland Kitchen omega-3 acid ethyl esters (LOVAZA) 1 g capsule Take by mouth 2 (two) times daily.    . prochlorperazine (COMPAZINE) 10 MG tablet Take 1 tablet (10 mg total) by mouth every 6 (six) hours as needed (Nausea or vomiting). 30 tablet 1  . venlafaxine XR (EFFEXOR XR) 150 MG 24 hr capsule Take 1 capsule (150 mg total) by mouth daily with breakfast. 30 capsule 3  . venlafaxine XR (EFFEXOR-XR) 75 MG 24 hr capsule Take 1 capsule (75 mg total) by mouth daily with breakfast. 30 capsule 2   Current Facility-Administered Medications  Medication Dose Route Frequency Provider Last Rate Last Dose  . gi cocktail (Maalox,Lidocaine,Donnatal)  30 mL Oral Once Harle Stanford., PA-C       Facility-Administered Medications Ordered in Other Visits  Medication Dose  Route Frequency Provider Last Rate Last Dose  . heparin lock flush 100 unit/mL  500 Units Intracatheter Once PRN [name], MD        . sodium chloride flush (NS) 0.9 % injection 10 mL  10 mL Intracatheter PRN [name], MD       REVIEW OF SYSTEMS: Constitutional: Denies fevers, (+) fatigue (+) worsened hot flashes (+) improved insomnia  Eyes: Denies blurriness of vision, double vision or watery eyes Ears, nose, mouth, throat, and face: Denies mucositis or sore throat (+) nosebleeds with crusting Respiratory: Denies cough, dyspnea or wheezes Cardiovascular: Denies palpitation, chest discomfort or lower extremity swelling Gastrointestinal:  Denies nausea, heartburn or change in bowel habits   Skin: Denies abnormal skin rashes MSK: (+) Soreness of neck and back  Lymphatics: Denies new lymphadenopathy or easy bruising Neurological:Denies numbness, tingling or new weaknesses (+) occasional neuropathy only in right hand  Behavioral/Psych: Mood is stable, no new changes  Breast: (+) bilateral mastectomy on 07/22/17 All other systems were reviewed with the patient and are negative.  PHYSICAL EXAMINATION:  ECOG PERFORMANCE STATUS: 1 - Symptomatic but completely ambulatory  Vitals:   07/17/18 1004  BP: 98/63  Pulse: (!) 107  Resp: 18  Temp: 98.7 F (37.1 C)  SpO2: 98%   Filed Weights   07/17/18 1004  Weight: 160 lb 1.6 oz (72.6 kg)      GENERAL:alert, no distress and comfortable SKIN: skin color, texture, turgor are normal, no rashes or significant lesions EYES: normal, conjunctiva are pink and non-injected, sclera clear OROPHARYNX:no exudate, no erythema and lips, buccal mucosa, and tongue normal  NECK: supple, thyroid normal size, non-tender, without nodularity LYMPH:  no palpable lymphadenopathy in the cervical, axillary or inguinal LUNGS: clear to auscultation and percussion with normal breathing effort HEART: regular rate & rhythm and no murmurs and no lower extremity edema ABDOMEN:abdomen soft, non-tender and normal bowel sounds Musculoskeletal:no cyanosis of digits and no clubbing  PSYCH: alert & oriented  x 3 with fluent speech NEURO: no focal motor/sensory deficits Breast: (+)bilateral mastectomy, incisions clean and intact, healed well. Diffuse hyperpigmentation in the right chest wall and axilla with mild skin peeling in the right axilla and lower right chest wall. No ulcers or discharge.   LABORATORY DATA:  I have reviewed the data as listed CBC Latest Ref Rng & Units 07/17/2018 05/29/2018 04/10/2018  WBC 3.9 - 10.3 K/uL 2.8(L) 4.6 1.5(L)  Hemoglobin 11.6 - 15.9 g/dL 10.9(L) 11.6 11.3(L)  Hematocrit 34.8 - 46.6 % 33.0(L) 34.1(L) 33.3(L)  Platelets 145 - 400 K/uL 170 187 152   CMP Latest Ref Rng & Units 07/17/2018 05/29/2018 04/10/2018  Glucose 70 - 99 mg/dL 146(H) 93 132  BUN 6 - 20 mg/dL _0 Creatinine 0.44 - 1.00 mg/dL 0.96 1.02(H) 0.94  Sodium 135 - 145 mmol/L 140 139 140  Potassium 3.5 - 5.1 mmol/L 3.9 4.0 3.4(L)  Chloride 98 - 111 mmol/L 106 104 108  CO2 22 - 32 mmol/L _1 Calcium 8.9 - 10.3 mg/dL 9.2 9.8 9.2  Total Protein 6.5 - 8.1 g/dL 6.8 7.1 6.7  Total Bilirubin 0.3 - 1.2 mg/dL 0.3 0.4 0.6  Alkaline Phos 38 - 126 U/L 96 109 78  AST 15 - 41 U/L 11(L) 13(L) 16  ALT 0 - 44 U/L _2 PATHOLOGY  Diagnosis 07/22/17 1. Breast, simple mastectomy, Left - FIBROADENOMA. - NO MALIGNANCY IDENTIFIED. 2. Breast, simple mastectomy, Right - MIXED INVASIVE LOBULAR AND  DUCTAL CARCINOMA, GRADE 2, SPANNING 5.3 CM. - ADDITIONAL FOCUS OF LOBULAR CARCINOMA, GRADE 2, SPANNING 2.2 CM. - INTERMEDIATE GRADE DUCTAL CARCINOMA IN SITU. - INVASIVE CARCINOMA COMES TO WITHIN 0.2 TO 0.3 CM OF THE DEEP MARGIN, FOCALLY. - TUMOR FOCALLY INVOLVES EPIDERMIS. - LYMPHOVASCULAR INVASION OF DERMAL LYMPHATICS. - ONE OF ONE LYMPH NODES NEGATIVE FOR CARCINOMA (0/1). - SEE ONCOLOGY TABLE. 3. Lymph node, sentinel, biopsy, Right axillary - ONE LYMPH NODE WITH ISOLATED TUMOR CELLS (0/1). 4. Lymph node, sentinel, biopsy, Right - METASTATIC CARCINOMA IN ONE OF ONE LYMPH NODES (1/1). 5. Lymph node,  sentinel, biopsy, Right - ONE OF ONE LYMPH NODES NEGATIVE FOR CARCINOMA (0/1). 6. Lymph node, sentinel, biopsy, Right - METASTATIC CARCINOMA IN ONE OF ONE LYMPH NODES (1/1). - EXTRACAPSULAR EXTENSION. 7. Lymph node, sentinel, biopsy, Right - METASTATIC CARCINOMA IN ONE OF ONE LYMPH NODES (1/1). - EXTRACAPSULAR EXTENSION. 8. Lymph node, sentinel, biopsy, Right - ONE OF ONE LYMPH NODES NEGATIVE FOR CARCINOMA (0/1). 9. Lymph node, sentinel, biopsy, Right - ONE OF ONE LYMPH NODES NEGATIVE FOR CARCINOMA (0/1). 10. Lymph node, sentinel, biopsy, Right - METASTATIC CARCINOMA IN ONE OF ONE LYMPH NODES (1/1). - EXTRACAPSULAR EXTENSION. 11. Lymph node, biopsy, Right axillary - ONE OF ONE LYMPH NODES NEGATIVE FOR CARCINOMA (0/1). Microscopic Comment 2. BREAST, INVASIVE TUMOR Microscopic Comment(continued) Procedure: Bilateral simple mastectomies with right axillary sentinel lymph node biopsies. Laterality: Right. Tumor Size: 5.3 cm. Histologic Type: Mixed lobular and ductal carcinoma. Grade: 2 Tubular Differentiation: 2 Nuclear Pleomorphism: 3 Mitotic Count: 1 Ductal Carcinoma in Situ (DCIS): Present, intermediate grade. Extent of Tumor: Involves epidermis of nipple focally. Margins: Invasive carcinoma, distance from closest margin: 0.2-0.3 cm of posterior margin (smaller mass). DCIS, distance from closest margin: >0.5 cm all margins Regional Lymph Nodes: Number of Lymph Nodes Examined: 10 Number of Sentinel Lymph Nodes Examined: 9 Lymph Nodes with Macrometastases: 4 Lymph Nodes with Micrometastases: 0 Lymph Nodes with Isolated Tumor Cells: 1 Breast Prognostic Profile: Performed on biopsy ([id number]), see below. Will be performed on additional lobular focus. Estrogen Receptor: Positive, 100% strong staining. Progesterone Receptor: Positive, 100% strong staining. Her2: Positive (ratio 2.55). Ki-67: 12%. Best tumor block for sendout testing: 2B (larger focus), 2E (smaller  focus). Pathologic Stage Classification (pTNM, AJCC 8th Edition): Primary Tumor (pT): mpT3 Regional Lymph Nodes (pN): pN2a Distant Metastases (pM): pMX  2. FLUORESCENCE IN-SITU HYBRIDIZATION Results: HER2 - NEGATIVE RATIO OF HER2/CEP17 SIGNALS 1.71 AVERAGE HER2 COPY NUMBER PER CELL 1.80 2. PROGNOSTIC INDICATORS Results: IMMUNOHISTOCHEMICAL AND MORPHOMETRIC ANALYSIS PERFORMED MANUALLY Estrogen Receptor: 95%, POSITIVE, STRONG STAINING INTENSITY Progesterone Receptor: 95%, POSITIVE, STRONG STAINING INTENSITY Proliferation Marker Ki67: 10%  4. FLUORESCENCE IN-SITU HYBRIDIZATION Results: HER2 - **POSITIVE**. OF NOTE, A TOTAL OF 40 TUMOR CELLS WERE EVALUATED FOR HER2 EXPRESSION. RATIO OF HER2/CEP17 SIGNALS 2.01 AVERAGE HER2 COPY NUMBER PER CELL 3.63  Estrogen Receptor: 95%, POSITIVE, STRONG STAINING INTENSITY Progesterone Receptor: 95%, POSITIVE, STRONG STAINING INTENSITY  Addendum 1. By immunohistochemistry, the tumor cells in part 2 are Negative for Her2 (1+).  2. The current prognostic profile was performed on the additional focus of pure lobular carcinoma.  Diagnosis 06/11/17 Breast, right, needle core biopsy, 11:00 o'clock - INVASIVE DUCTAL CARCINOMA, SEE COMMENT. Microscopic Comment The carcinoma appears grade 2. Prognostic markers will be ordered. Dr. Lyndon Code has reviewed the case. The case was called to [facility] on 06/12/2017. Results: HER2 - **POSITIVE** RATIO OF HER2/CEP17 SIGNALS 2.55 AVERAGE HER2 COPY NUMBER PER CELL 4.85    PROCEDURES  ECHO 07/13/18  Impressions: - Normal LV size with EF 55-60%. Strain as above. Normal RV size   and systolic function. No significant valvular abnormalities.  ECHO expected 08/12/17  RADIOGRAPHIC STUDIES: I have personally reviewed the radiological images as listed and agreed with the findings in the report. No results found.   PET Scan 02/04/18  IMPRESSION: 1. The previous consolidation in the right  middle lobe currently has a more nodular and reticular appearance with some associated mild atelectasis along its anterior margin. This demonstrates low-grade metabolic activity with maximum SUV 3.2 (background blood pool activity is 2.7). Although conceivably related to low-grade residuum from the patient's malignancy, the presence of calcified right hilar lymph nodes and small calcifications along the nodularity raise suspicion for a low-grade active granulomatous process or atypical infectious process. Surveillance is recommended. 2. Scattered deposits of hypermetabolic but benign metabolically active brown fat.  CT AP W Contrast 12/29/17 IMPRESSION: No definite acute intra-abdominal or intrapelvic abnormalities. 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  CT Chest W Contrast 12/28/17 IMPRESSION: 1. 2.3 x 2.7 x 1.2 cm irregular masslike opacity within the right middle lobe worrisome for malignancy/metastasis. 2. 3 new very small sclerotic lesions, within the sternum and the body of T4, worrisome for new metastases.  CT AP W Contrast 09/05/17 IMPRESSION: Stable 7 mm hyperdensity seen in right hepatic lobe most consistent with hemangioma based on recent MRI. 2.4 cm right ovarian involuting cyst is noted. No other abnormality seen in the abdomen or pelvis.  MRI Abdomen at [facility] 08/25/17  IMPRESSION: 1. Lesion in segment 7 of the liver has signal and enhancement characteristics compatible with a flash filling hemangioma. No specific findings identified to suggest metastatic disease.  ASSESSMENT & PLAN:  Sheila [name] is a 42 y.o. premenopausal female with a history of Vitamin D deficiency and high cholesterol and kidney stones, presented with a palpable right breast mass.   1. Malignant neoplasm of upper-outer quadrant of right breast in female, mixed invasive ductal and lobular carcinoma, pT3(m)N2aM0, stage 1b, ductal carcinoma triple  positive, lobular carcinoma ER+/PR+/HER2-,  Grade 2 -She underwent staging CT and bone scan on 07/02/2017 prior to surgery, both were negative for metastasis. -There was a 66m enhancing or hyperdense focus posteriorly in the liver which is technically nonspecific, and her abdominal MRI showed this lesion is likely hemangioma. -I previously reviewed the surgical pathology with the patient and her husband in detail.  -Final diagnosis from the right simple mastectomy and sentinel lymph node biopsy on 07/22/2017 revealed mixed invasive lobular and ductal carcinoma, grade 2, spanning 5.3 cm. 75% are ductal histology. There is an additional focus of lobular carcinoma, grade 2, spanning 2.2cm and it is ER+/PR+/HER2-. 4 of 10 lymph nodes tested positive for macrometastases and they are HER2- ductal carcinoma except one was mixed ductal and lobular. I have discussed her final surgical path wit pathologist Dr. MTresa Moorein details.   -Her initial biopsy was HER2+ by FISH (ratio 2.55 and copy number 4.85) in her ductal carcinoma. Her lobular were HER2 negative.   -We reviewed the risk of cancer recurrence after complete surgical resection. Giving the large size of tumor, multiple positive lymph nodes, HER-2 positive disease, she has very high risk for recurrence. She completed adjuvant chemo with TCHP.  We will continue maintenance Herceptin and pejeta to complete 1 year of therapy.  -She initially preferred reconstruction. She saw Dr. TIran Planasbut ultimately did not opt for reconstruction.  -Given her ER/PR positive tumor  and her young age, I recommend ovarian suppression and adjuvant antiestrogen therapy with AI. She has started Zoladex on 03/20/2018 ---CT Chest wo contrast, 04/06/2018 showed: Stable linear nodular thickening in the RIGHT middle lobe is favored post infectious or inflammatory scarring. Calcified RIGHT hilar lymph node is consistent post infectious process. Stable small LEFT upper lobe nodule. Stable  sclerotic lesion in the manubrium. Sclerotic lesion at T3 is more prominent (4 mm). Sclerotic lesion at L 1 is new from 07/02/2017. I reviewed her scan findings with pt and her husband. I agree with the radiology interpretation. Her bone lesions are indeterminate, previously negative on PET, will be followed in 3 months with CT and bone scan.  -Patient completed radiation on 04/09/2018 with radiation dates of 02/23/2018-04/09/2018 -Her recent echo showed decreased EF 45-50%, Dr. Algernon Huxley has recommended to hold Herceptin and pejeta. Rpt ECHO showed EF remained 45-50%. Continue to hold Herceptin/Perjeta.  -She started adjuvant Letrozole on 05/03/18.  She is on Zoladex injection for ovarian suppression, currently well except a hot flash. -07/13/18 ECHO showed improved EF to 55-60%.  Dr. Aundra Dubin feels she has recovered well, and okay to restart Herceptin and pejeta. -Given her recent back/neck pain and sclerotic bone lesions on 03/2018 CT, I will get bone scan and CT to further evaluate. If results are suspicious for recurrence I will request biopsy. She is agreeable.  -Labs reviewed, WBC at 2.8, Hg at 10.9. She has been having recent nose bleeds. I will monitor her counts. Overall adequate to proceed with Herceptin/Perjeta today. -Continue with Zoladex injection today and monthly.  -I sent Gyn referral today in preparation for her oophorectomy.  -F/u in 3 weeks    2. Genetics -Due to her family history of breast cancer, she agreed to a genetics referral -She was negative for all mutations tested   3. Insomnia  -She has history of insomnia failed multiple agents such as lunesta, trazodone, tylenol pm, and xanax and only had mild relief with Remeron. -She reported that Ativan has alleviated her symptoms thus far and she voiced needing a refill. Refill provided previously (01/16/18) -We previously discussed potentially weaning her off Ativan for sleep, she has mild anxiety due to treatment. She previously was  able to ween off Ativan and started Ambien.  -Patient notes that her sleep has improved with Ambien.  -She was weaned off Ambien is now on high dose melatonin. Overall improved and manageable.   4. Hot flash  -Her hot flashes are moderate-severe, multiple times a day. She states she was previously on Effexor 69m for depression and had to discontinue due to the Remeron. She is off Remeron right not.  -She restarted 761mEffexor, prescribed 02/06/18 -much improved on Effexor   -She is now taking 2 tablets of 75 mg Effexor daily with improvement of her hot flashes. I previously prescribed 150 mg Effexor on 04/10/2018.  -Her hot flashes have worsened lately. I increased her Effexor to 22572my prescribing 3m30mfexor today to add to her 150mg71m30/19)   5. Bone Health  -I previously briefly discussed previously how an aromatase inhibitor can weaken the bone -She currently taking Vitamin D supplement  -At this time, there is not a need for Vitamin C supplements, however, I previously discussed with the patient to consume vitamin C rich foods to aid with this.  -We will order a bone density scan in 2020  6. Diffuse Back and neck pain/soreness  -Her CT Chest on 04/06/18 showed sclerotic lesions of spine in  T3 and L1.  -Given her recent back pain I will order a CT and Bone scan to further evaluate.   PLAN:  -Today I prescribed her 49m Effexor to add to her 1534mfor hot flush  -Labs reviewed and ECHO reviewed, and adequate to restart Herceptin/Perjeta today  -Zoladex injection today and continue monthly  -Lab, F/u and Herceptin/Perjeta in 3 weeks -CT and bone scan in 2 weeks  -Referral to Dr. RoDenman Georgeor BSO    All questions were answered. The patient knows to call the clinic with any problems, questions or concerns.  I spent 25 minutes counseling the patient face to face. The total time spent in the appointment was 30 minutes and more than 50% was on counseling.  I,Oneal Deputyam  acting as scribe for YaTruitt MerleMD.   I have reviewed the above documentation for accuracy and completeness, and I agree with the above.    YaTruitt Merle8/30/2019 12:58 PM

## 2018-07-17 NOTE — Telephone Encounter (Signed)
Appts scheduled AVS/Calendar printed per 8/30 los

## 2018-07-21 ENCOUNTER — Telehealth: Payer: Self-pay | Admitting: Oncology

## 2018-07-21 NOTE — Telephone Encounter (Signed)
Sheila [name] and scheduled appointment with Dr. Gerarda Fraction on 07/24/18 at 9 am with 8:30 arrival.  She verbalized understanding and agreement.

## 2018-07-23 NOTE — Progress Notes (Signed)
[place] at [facility] Note: New Patient FIRST VISIT   Consult was requested by Dr. Truitt [name] for h/o hormone sensitive breast cancer to consider BSO   Chief Complaint  Patient presents with  . Estrogen receptor positive    HPI: [name]  is a very nice 42 y.o.  P0  The patient was diagnosed just over a year ago with hormone (estrogen positive) sensitive breast cancer. She has had genetic testing which was negative for mutation. She is stage IB with positive nodes.  For her breast cancer she had bilateral mastectomy followed by adjuvant chemotherapy and is currently on Herceptin and Perjeta maintenance therapy every 3 weeks. Held 03/2018-07/17/18 due to low EF% and diagnosed per Epic notes with cardiomyopathy. Getting Monthly Zoladex injections starting 03/20/18 and Letrozole 2.5mg  once daily starting 05/03/18.  She is looking for alternative to her monthly Zoladex for long-term hormone suppression.  No pelvic complaints. Does note significant recent onset back pain. States Dr. Ernestina [name] office ordering scans    ECOG PERFORMANCE STATUS: 1 - Symptomatic but completely ambulatory  Measurement of disease: NA for Gynecologic purposes  Radiology: . 01/2018 PET - Mild likely physiologic activity in the duodenal bulb. No appreciable significant abnormal hypermetabolic activity in the abdomen/pelvis.  Outpatient Encounter Medications as of 07/24/2018  Medication Sig  . carvedilol (COREG) 3.125 MG tablet TAKE 1 TABLET BY MOUTH 2 TIMES DAILY.  Marland Kitchen Cholecalciferol (VITAMIN D) 2000 units tablet Take 2,000 Units by mouth daily.  . diphenoxylate-atropine (LOMOTIL) 2.5-0.025 MG tablet Take 1-2 tablets by mouth 4 (four) times daily as needed for diarrhea or loose stools.  . fluticasone (FLONASE) 50 MCG/ACT nasal spray Place 1 spray into both nostrils daily.  Marland Kitchen letrozole (FEMARA) 2.5 MG tablet Take 1 tablet (2.5 mg total) by mouth daily.  Marland Kitchen  levothyroxine (SYNTHROID, LEVOTHROID) 25 MCG tablet Take 25 mcg by mouth daily before breakfast.  . lidocaine-prilocaine (EMLA) cream Apply to affected area once (Patient taking differently: Apply 1 application topically once as needed (port access). Apply to affected area once)  . losartan (COZAAR) 25 MG tablet Take 0.5 tablets (12.5 mg total) by mouth at bedtime.  . Melatonin 10 MG TABS Take 1 tablet by mouth.   . naproxen sodium (ANAPROX) 220 MG tablet Take 220 mg 2 (two) times daily as needed by mouth (pain).   Marland Kitchen omega-3 acid ethyl esters (LOVAZA) 1 g capsule Take by mouth daily.   . prochlorperazine (COMPAZINE) 10 MG tablet Take 1 tablet (10 mg total) by mouth every 6 (six) hours as needed (Nausea or vomiting).  . venlafaxine XR (EFFEXOR XR) 150 MG 24 hr capsule Take 1 capsule (150 mg total) by mouth daily with breakfast.  . venlafaxine XR (EFFEXOR-XR) 75 MG 24 hr capsule Take 1 capsule (75 mg total) by mouth daily with breakfast.   Facility-Administered Encounter Medications as of 07/24/2018  Medication  . gi cocktail (Maalox,Lidocaine,Donnatal)   No Known Allergies  Past Medical History:  Diagnosis Date  . Anxiety   . Cancer [facility])    right breast   . Cardiomyopathy ([place])   . Family history of breast cancer   . Family history of colon cancer   . GERD (gastroesophageal reflux disease)   . Headache    couple of migraines in the past  . History of kidney stones   . Vitamin D deficiency    Past Surgical History:  Procedure Laterality Date  . egg donation    .  kidney stone removal    . MASTECTOMY W/ SENTINEL NODE BIOPSY Bilateral 07/22/2017   Procedure: RIGHT TOTAL MASTECTOMY WITH RIGHT AXILLARY SENTINEL LYMPH NODE BIOPSY, LEFT PROPHYLACTIC MASTECTOMY;  Surgeon: [name], MD;  Location: [facility];  Service: General;  Laterality: Bilateral;  . PORTACATH PLACEMENT Right 07/22/2017   Procedure: INSERTION PORT-A-CATH WITH [name];  Surgeon: [name], MD;  Location: [facility];   Service: General;  Laterality: Right;        Past Gynecological History:   GYNECOLOGIC HISTORY:  . No LMP recorded. (Menstrual status: Chemotherapy). 42yo stopped with breast CA tx . Menarche: 42 years old . P 0 . Contraceptive OCP ~ 1 year . HRT None  . Last Pap 05/2017 negative Family Hx:  Family History  Problem Relation Age of Onset  . Breast cancer Paternal Grandmother 60  . Colon cancer Paternal Grandmother 29  . Breast cancer Mother 35  . Osteoporosis Mother   . Prostate cancer Paternal Grandfather        dx in his 11s  . Leukemia Paternal Grandfather   . Lung cancer Maternal Uncle        heavy smoker  . Other Paternal Uncle        farm accident  . Stroke Maternal Grandfather    Social Hx:  Marland Kitchen Tobacco use: none . Alcohol use: < 1 / year . Illicit Drug use: none . Illicit IV Drug use: none    Review of Systems: Review of Systems  Constitutional: Positive for fatigue.  Musculoskeletal: Positive for back pain and myalgias.  All other systems reviewed and are negative.   Vitals: There were no vitals taken for this visit. Vitals:   07/24/18 0904  Weight: 159 lb (72.1 kg)  Height: 5\' 6"  (1.676 m)    Vitals:   07/24/18 0904  BP: 102/63  Pulse: (!) 101  Resp: 20  Temp: 97.9 F (36.6 C)  SpO2: 100%   Body mass index is 25.66 kg/m.   Physical Exam: General :  Well developed, 42 y.o., female in no apparent distress HEENT:  Normocephalic/atraumatic, symmetric, EOMI, eyelids normal; chemo induced alopecia Neck:   Supple, no masses.  Lymphatics:  No cervical/ submandibular/ supraclavicular/ infraclavicular/ inguinal adenopathy Respiratory:  Respirations unlabored, no use of accessory muscles CV:   Deferred Breast:  Deferred Musculoskeletal: No CVA tenderness, normal muscle strength. Abdomen:  Soft, non-tender and nondistended. No evidence of hernia. No masses. Extremities:  No lymphedema, no erythema, non-tender. Skin:   Normal inspection Neuro/Psych:   No focal motor deficit, no abnormal mental status. Normal gait. Normal affect. Alert and oriented to person, place, and time  Genito Urinary: Vulva: Normal external female genitalia.  Bladder/urethra: Urethral meatus normal in size and location. No lesions or   masses, well supported bladder Speculum exam: Vagina: No lesion, no discharge, no bleeding. Cervix: Normal appearing, no lesions. Bimanual exam:  Uterus: Normal size, mobile.  Adnexa: No masses. Rectovaginal:  Good tone, no masses, no cul de sac nodularity, no parametrial involvement or nodularity.   Assessment  Hormone sensitive breast cancer  Plan  1. Data reviewed ? PET scan report as noted ? I reviewed her referring doctor's office notes and I have summarized in the HPI ? History was obtained from the patient and EPIC ? Her oncologic course until now 2. Back pain ? Per medical oncology 3. Risks/benefits/alternatives of oophorectomy discussed ? We reviewed risks of osteoporosis, cardiovascular disease, and infertility 4. Surgical discussion o Risks, benefits, and alternatives of the  surgery itself were reviewed along with the surgical sketch. She was given a copy of that today. o She would like to wait until the end of the year for surgery. We'll plan to see her back for a brief followup before then. 5. She is having scans for the back pain so we'll see what those show on her return visit.  Face to face time with patient was 40 minutes. Over 50% of this time was spent on counseling and coordination of care.  Isabel Caprice, MD  07/24/2018, 10:14 AM    Cc: Sheila Merle, MD (Referring Medical Oncologist) [name], MD (PCP)

## 2018-07-24 ENCOUNTER — Encounter (HOSPITAL_COMMUNITY): Payer: Self-pay

## 2018-07-24 ENCOUNTER — Other Ambulatory Visit: Payer: Self-pay | Admitting: Nurse Practitioner

## 2018-07-24 ENCOUNTER — Encounter: Payer: Self-pay | Admitting: Obstetrics

## 2018-07-24 ENCOUNTER — Inpatient Hospital Stay: Payer: BLUE CROSS/BLUE SHIELD | Attending: Nurse Practitioner | Admitting: Obstetrics

## 2018-07-24 VITALS — BP 102/63 | HR 101 | Temp 97.9°F | Resp 20 | Ht 66.0 in | Wt 159.0 lb

## 2018-07-24 DIAGNOSIS — R9389 Abnormal findings on diagnostic imaging of other specified body structures: Secondary | ICD-10-CM | POA: Diagnosis not present

## 2018-07-24 DIAGNOSIS — D493 Neoplasm of unspecified behavior of breast: Secondary | ICD-10-CM

## 2018-07-24 DIAGNOSIS — Z5112 Encounter for antineoplastic immunotherapy: Secondary | ICD-10-CM | POA: Insufficient documentation

## 2018-07-24 DIAGNOSIS — Z9013 Acquired absence of bilateral breasts and nipples: Secondary | ICD-10-CM | POA: Insufficient documentation

## 2018-07-24 DIAGNOSIS — Z9221 Personal history of antineoplastic chemotherapy: Secondary | ICD-10-CM | POA: Insufficient documentation

## 2018-07-24 DIAGNOSIS — C50411 Malignant neoplasm of upper-outer quadrant of right female breast: Secondary | ICD-10-CM | POA: Insufficient documentation

## 2018-07-24 DIAGNOSIS — R918 Other nonspecific abnormal finding of lung field: Secondary | ICD-10-CM | POA: Insufficient documentation

## 2018-07-24 DIAGNOSIS — Z79899 Other long term (current) drug therapy: Secondary | ICD-10-CM | POA: Insufficient documentation

## 2018-07-24 DIAGNOSIS — M549 Dorsalgia, unspecified: Secondary | ICD-10-CM | POA: Insufficient documentation

## 2018-07-24 DIAGNOSIS — Z170 Estrogen receptor positive status [ER+]: Secondary | ICD-10-CM | POA: Insufficient documentation

## 2018-07-24 DIAGNOSIS — Z7189 Other specified counseling: Secondary | ICD-10-CM | POA: Diagnosis not present

## 2018-07-24 DIAGNOSIS — Z5111 Encounter for antineoplastic chemotherapy: Secondary | ICD-10-CM | POA: Diagnosis not present

## 2018-07-24 DIAGNOSIS — Z79811 Long term (current) use of aromatase inhibitors: Secondary | ICD-10-CM | POA: Diagnosis not present

## 2018-07-24 DIAGNOSIS — N951 Menopausal and female climacteric states: Secondary | ICD-10-CM | POA: Diagnosis not present

## 2018-07-24 NOTE — Progress Notes (Signed)
[employer] [facility] forms completed and documents attached per request for dates 11/18/2017-present and faxed to provided # [phone] Copy of forms and request scanned into patient's electronic medical record under media tab for reference.  Renee Pain, RN

## 2018-07-24 NOTE — Patient Instructions (Signed)
Preparing for your Surgery  Plan for surgery on November 10, 2018 with Dr. Precious Haws at [facility] will be scheduled for a robotic assisted bilateral salpingo-oophorectomy, possible laparotomy.  Pre-operative Testing -You will receive a phone call from presurgical testing at [facility] to arrange for a pre-operative testing appointment before your surgery.  This appointment normally occurs one to two weeks before your scheduled surgery.   -Bring your insurance card, copy of an advanced directive if applicable, medication list  -At that visit, you will be asked to sign a consent for a possible blood transfusion in case a transfusion becomes necessary during surgery.  The need for a blood transfusion is rare but having consent is a necessary part of your care.     -You should not be taking blood thinners or aspirin at least ten days prior to surgery unless instructed by your surgeon.  Day Before Surgery at [place] will be asked to take in a light diet the day before surgery.  Avoid carbonated beverages.  You will be advised to have nothing to eat or drink after midnight the evening before.    Eat a light diet the day before surgery.  Examples including soups, broths, toast, yogurt, mashed potatoes.  Things to avoid include carbonated beverages  (fizzy beverages), raw fruits and raw vegetables, or beans.   If your bowels are filled with gas, your surgeon will have difficulty visualizing your pelvic organs which increases your surgical risks.  Your role in recovery Your role is to become active as soon as directed by your doctor, while still giving yourself time to heal.  Rest when you feel tired. You will be asked to do the following in order to speed your recovery:  - Cough and breathe deeply. This helps toclear and expand your lungs and can prevent pneumonia. You may be given a spirometer to practice deep breathing. A staff member will show you how  to use the spirometer. - Do mild physical activity. Walking or moving your legs help your circulation and body functions return to normal. A staff member will help you when you try to walk and will provide you with simple exercises. Do not try to get up or walk alone the first time. - Actively manage your pain. Managing your pain lets you move in comfort. We will ask you to rate your pain on a scale of zero to 10. It is your responsibility to tell your doctor or nurse where and how much you hurt so your pain can be treated.  Special Considerations -If you are diabetic, you may be placed on insulin after surgery to have closer control over your blood sugars to promote healing and recovery.  This does not mean that you will be discharged on insulin.  If applicable, your oral antidiabetics will be resumed when you are tolerating a solid diet.  -Your final pathology results from surgery should be available by the Friday after surgery and the results will be relayed to you when available.  -Dr. Lahoma Crocker is the Surgeon that assists your GYN Oncologist with surgery.  The next day after your surgery you will either see your GYN Oncologist or Dr. Lahoma Crocker.   Blood Transfusion Information WHAT IS A BLOOD TRANSFUSION? A transfusion is the replacement of blood or some of its parts. Blood is made up of multiple cells which provide different functions.  Red blood cells carry oxygen and are used for blood loss replacement.  White blood cells fight against infection.  Platelets control bleeding.  Plasma helps clot blood.  Other blood products are available for specialized needs, such as hemophilia or other clotting disorders. BEFORE THE TRANSFUSION  Who gives blood for transfusions?   You may be able to donate blood to be used at a later date on yourself (autologous donation).  Relatives can be asked to donate blood. This is generally not any safer than if you have received blood  from a stranger. The same precautions are taken to ensure safety when a relative's blood is donated.  Healthy volunteers who are fully evaluated to make sure their blood is safe. This is blood bank blood. Transfusion therapy is the safest it has ever been in the practice of medicine. Before blood is taken from a donor, a complete history is taken to make sure that person has no history of diseases nor engages in risky social behavior (examples are intravenous drug use or sexual activity with multiple partners). The donor's travel history is screened to minimize risk of transmitting infections, such as malaria. The donated blood is tested for signs of infectious diseases, such as HIV and hepatitis. The blood is then tested to be sure it is compatible with you in order to minimize the chance of a transfusion reaction. If you or a relative donates blood, this is often done in anticipation of surgery and is not appropriate for emergency situations. It takes many days to process the donated blood. RISKS AND COMPLICATIONS Although transfusion therapy is very safe and saves many lives, the main dangers of transfusion include:   Getting an infectious disease.  Developing a transfusion reaction. This is an allergic reaction to something in the blood you were given. Every precaution is taken to prevent this. The decision to have a blood transfusion has been considered carefully by your caregiver before blood is given. Blood is not given unless the benefits outweigh the risks.

## 2018-07-31 ENCOUNTER — Encounter (HOSPITAL_COMMUNITY)
Admission: RE | Admit: 2018-07-31 | Discharge: 2018-07-31 | Disposition: A | Discharge status: Home / Self Care | Payer: BLUE CROSS/BLUE SHIELD | Source: Ambulatory Visit | Attending: Nurse Practitioner | Admitting: Nurse Practitioner

## 2018-07-31 ENCOUNTER — Ambulatory Visit (HOSPITAL_COMMUNITY)
Admission: RE | Admit: 2018-07-31 | Discharge: 2018-07-31 | Disposition: A | Discharge status: Home / Self Care | Payer: BLUE CROSS/BLUE SHIELD | Source: Ambulatory Visit | Attending: Nurse Practitioner | Admitting: Nurse Practitioner

## 2018-07-31 DIAGNOSIS — R918 Other nonspecific abnormal finding of lung field: Secondary | ICD-10-CM | POA: Diagnosis not present

## 2018-07-31 DIAGNOSIS — R590 Localized enlarged lymph nodes: Secondary | ICD-10-CM | POA: Insufficient documentation

## 2018-07-31 DIAGNOSIS — M549 Dorsalgia, unspecified: Secondary | ICD-10-CM | POA: Insufficient documentation

## 2018-07-31 DIAGNOSIS — D493 Neoplasm of unspecified behavior of breast: Secondary | ICD-10-CM

## 2018-07-31 DIAGNOSIS — C50919 Malignant neoplasm of unspecified site of unspecified female breast: Secondary | ICD-10-CM | POA: Diagnosis not present

## 2018-07-31 DIAGNOSIS — R937 Abnormal findings on diagnostic imaging of other parts of musculoskeletal system: Secondary | ICD-10-CM | POA: Diagnosis not present

## 2018-07-31 MED ORDER — IOPAMIDOL (ISOVUE-300) INJECTION 61%
30.0000 mL | Freq: Once | INTRAVENOUS | Status: AC | PRN
  Administered 2018-07-31: 30 mL via ORAL

## 2018-07-31 MED ORDER — IOHEXOL 300 MG/ML  SOLN
100.0000 mL | Freq: Once | INTRAMUSCULAR | Status: AC | PRN
  Administered 2018-07-31: 100 mL via INTRAVENOUS

## 2018-07-31 MED ORDER — TECHNETIUM TC 99M MEDRONATE IV KIT
20.3000 | PACK | Freq: Once | INTRAVENOUS | Status: AC
  Administered 2018-07-31: 20.3 via INTRAVENOUS

## 2018-07-31 MED ORDER — IOPAMIDOL (ISOVUE-300) INJECTION 61%
INTRAVENOUS | Status: AC
  Filled 2018-07-31: qty 30

## 2018-08-04 ENCOUNTER — Other Ambulatory Visit: Payer: Self-pay | Admitting: Hematology

## 2018-08-04 ENCOUNTER — Other Ambulatory Visit (HOSPITAL_COMMUNITY): Payer: Self-pay | Admitting: Cardiology

## 2018-08-06 ENCOUNTER — Inpatient Hospital Stay: Payer: BLUE CROSS/BLUE SHIELD

## 2018-08-06 ENCOUNTER — Inpatient Hospital Stay (HOSPITAL_BASED_OUTPATIENT_CLINIC_OR_DEPARTMENT_OTHER): Payer: BLUE CROSS/BLUE SHIELD | Admitting: Nurse Practitioner

## 2018-08-06 VITALS — BP 97/47 | HR 106 | Temp 98.1°F | Resp 18 | Ht 66.0 in | Wt 158.9 lb

## 2018-08-06 DIAGNOSIS — Z79811 Long term (current) use of aromatase inhibitors: Secondary | ICD-10-CM

## 2018-08-06 DIAGNOSIS — Z170 Estrogen receptor positive status [ER+]: Principal | ICD-10-CM

## 2018-08-06 DIAGNOSIS — Z79899 Other long term (current) drug therapy: Secondary | ICD-10-CM

## 2018-08-06 DIAGNOSIS — C50411 Malignant neoplasm of upper-outer quadrant of right female breast: Secondary | ICD-10-CM

## 2018-08-06 DIAGNOSIS — Z9221 Personal history of antineoplastic chemotherapy: Secondary | ICD-10-CM | POA: Diagnosis not present

## 2018-08-06 DIAGNOSIS — M549 Dorsalgia, unspecified: Secondary | ICD-10-CM | POA: Diagnosis not present

## 2018-08-06 DIAGNOSIS — N951 Menopausal and female climacteric states: Secondary | ICD-10-CM

## 2018-08-06 DIAGNOSIS — R918 Other nonspecific abnormal finding of lung field: Secondary | ICD-10-CM

## 2018-08-06 DIAGNOSIS — Z95828 Presence of other vascular implants and grafts: Secondary | ICD-10-CM

## 2018-08-06 DIAGNOSIS — Z5112 Encounter for antineoplastic immunotherapy: Secondary | ICD-10-CM | POA: Diagnosis not present

## 2018-08-06 DIAGNOSIS — Z5111 Encounter for antineoplastic chemotherapy: Secondary | ICD-10-CM | POA: Diagnosis not present

## 2018-08-06 DIAGNOSIS — N83202 Unspecified ovarian cyst, left side: Secondary | ICD-10-CM

## 2018-08-06 DIAGNOSIS — Z9013 Acquired absence of bilateral breasts and nipples: Secondary | ICD-10-CM

## 2018-08-06 DIAGNOSIS — R9389 Abnormal findings on diagnostic imaging of other specified body structures: Secondary | ICD-10-CM | POA: Diagnosis not present

## 2018-08-06 DIAGNOSIS — M5489 Other dorsalgia: Secondary | ICD-10-CM

## 2018-08-06 LAB — CBC WITH DIFFERENTIAL/PLATELET
BASOS PCT: 1 %
Basophils Absolute: 0 10*3/uL (ref 0.0–0.1)
EOS ABS: 0.1 10*3/uL (ref 0.0–0.5)
EOS PCT: 5 %
HCT: 34.6 % — ABNORMAL LOW (ref 34.8–46.6)
Hemoglobin: 11.7 g/dL (ref 11.6–15.9)
LYMPHS ABS: 0.8 10*3/uL — AB (ref 0.9–3.3)
Lymphocytes Relative: 26 %
MCH: 31.1 pg (ref 25.1–34.0)
MCHC: 33.8 g/dL (ref 31.5–36.0)
MCV: 92 fL (ref 79.5–101.0)
Monocytes Absolute: 0.3 10*3/uL (ref 0.1–0.9)
Monocytes Relative: 9 %
NEUTROS PCT: 59 %
Neutro Abs: 1.7 10*3/uL (ref 1.5–6.5)
Platelets: 180 10*3/uL (ref 145–400)
RBC: 3.76 MIL/uL (ref 3.70–5.45)
RDW: 13.1 % (ref 11.2–14.5)
WBC: 2.9 10*3/uL — AB (ref 3.9–10.3)

## 2018-08-06 LAB — COMPREHENSIVE METABOLIC PANEL
ALT: 9 U/L (ref 0–44)
ANION GAP: 8 (ref 5–15)
AST: 12 U/L — ABNORMAL LOW (ref 15–41)
Albumin: 3.8 g/dL (ref 3.5–5.0)
Alkaline Phosphatase: 117 U/L (ref 38–126)
BUN: 15 mg/dL (ref 6–20)
CALCIUM: 9.4 mg/dL (ref 8.9–10.3)
CHLORIDE: 107 mmol/L (ref 98–111)
CO2: 24 mmol/L (ref 22–32)
CREATININE: 1.03 mg/dL — AB (ref 0.44–1.00)
Glucose, Bld: 143 mg/dL — ABNORMAL HIGH (ref 70–99)
Potassium: 3.8 mmol/L (ref 3.5–5.1)
Sodium: 139 mmol/L (ref 135–145)
Total Bilirubin: 0.4 mg/dL (ref 0.3–1.2)
Total Protein: 7.4 g/dL (ref 6.5–8.1)

## 2018-08-06 MED ORDER — HEPARIN SOD (PORK) LOCK FLUSH 100 UNIT/ML IV SOLN
500.0000 [IU] | Freq: Once | INTRAVENOUS | Status: AC | PRN
  Administered 2018-08-06: 500 [IU]
  Filled 2018-08-06: qty 5

## 2018-08-06 MED ORDER — SODIUM CHLORIDE 0.9% FLUSH
10.0000 mL | INTRAVENOUS | Status: DC | PRN
  Administered 2018-08-06: 10 mL
  Filled 2018-08-06: qty 10

## 2018-08-06 MED ORDER — SODIUM CHLORIDE 0.9% FLUSH
10.0000 mL | Freq: Once | INTRAVENOUS | Status: AC
  Administered 2018-08-06: 10 mL
  Filled 2018-08-06: qty 10

## 2018-08-06 MED ORDER — ANASTROZOLE 1 MG PO TABS: 1.0000 mg | ORAL_TABLET | Freq: Every day | ORAL | 1 refills | Status: DC

## 2018-08-06 MED ORDER — ACETAMINOPHEN 325 MG PO TABS
ORAL_TABLET | ORAL | Status: AC
  Filled 2018-08-06: qty 2

## 2018-08-06 MED ORDER — SODIUM CHLORIDE 0.9 % IV SOLN
Freq: Once | INTRAVENOUS | Status: AC
  Administered 2018-08-06: 15:00:00 via INTRAVENOUS
  Filled 2018-08-06: qty 250

## 2018-08-06 MED ORDER — DIPHENHYDRAMINE HCL 25 MG PO CAPS: 50.0000 mg | ORAL_CAPSULE | Freq: Once | ORAL | Status: DC

## 2018-08-06 MED ORDER — ACETAMINOPHEN 325 MG PO TABS
650.0000 mg | ORAL_TABLET | Freq: Once | ORAL | Status: AC
  Administered 2018-08-06: 650 mg via ORAL

## 2018-08-06 MED ORDER — TRASTUZUMAB CHEMO 150 MG IV SOLR
450.0000 mg | Freq: Once | INTRAVENOUS | Status: AC
  Administered 2018-08-06: 450 mg via INTRAVENOUS
  Filled 2018-08-06: qty 21.43

## 2018-08-06 MED ORDER — SODIUM CHLORIDE 0.9 % IV SOLN
420.0000 mg | Freq: Once | INTRAVENOUS | Status: AC
  Administered 2018-08-06: 420 mg via INTRAVENOUS
  Filled 2018-08-06: qty 14

## 2018-08-06 MED ORDER — CYCLOBENZAPRINE HCL 5 MG PO TABS: 5.0000 mg | ORAL_TABLET | Freq: Two times a day (BID) | ORAL | 0 refills | Status: DC | PRN

## 2018-08-06 NOTE — Progress Notes (Signed)
[facility]  [phone] [phone]  Clinic Follow up Note   Patient Care Team: [name], MD as PCP - General (Family Medicine) [name] (Family Medicine) [name], MD as Consulting Physician (General Surgery) [name], MD as Consulting Physician (Hematology) Kyung Rudd, MD as Consulting Physician (Radiation Oncology) Date of service: 08/06/18   SUMMARY OF ONCOLOGIC HISTORY: Oncology History    Cancer Staging Malignant neoplasm of upper-outer quadrant of right breast in female, estrogen receptor positive ([place]) Staging form: Breast, AJCC 8th Edition - Clinical stage from 06/11/2017: Stage IB (cT3, cN0, cM0, G2, ER: Positive, PR: Positive, HER2: Positive) - Signed by [name], MD on 06/16/2017 - Pathologic stage from 07/22/2017: Stage IB (pT3(m), pN2a(sn), cM0, G2, ER: Positive, PR: Positive, HER2: Positive) - Signed by [name], NP on 08/04/2017      Malignant neoplasm of upper-outer quadrant of right breast in female, estrogen receptor positive ([place])   06/11/2017 Initial Biopsy    Diagnosis 06/11/17 Breast, right, needle core biopsy, 11:00 o'clock - INVASIVE DUCTAL CARCINOMA, G2    06/11/2017 Mammogram    [name] and MM Diagnostic Breast Tomo Bilateral 06/11/17 IMPRESSION: 1. Highly suspicious mass within the retroareolar right breast, extending from the 8:00 to 11:00 axes, anterior to posterior depth, extending anteriorly to the nipple with associated nipple retraction, measuring at least 5.5 cm by ultrasound  2. No sonographic evidence of metastatic lymphadenopathy in the right axilla. 3. No evidence of malignancy within the left breast.     06/11/2017 Initial Diagnosis    Malignant neoplasm of upper-outer quadrant of right breast in female, estrogen receptor positive ([place])    06/11/2017 Receptors her2    Estrogen Receptor: 100%, POSITIVE, STRONG STAINING INTENSITY Progesterone Receptor: 100%, POSITIVE, STRONG  STAINING INTENSITY Proliferation Marker Ki67: 12%Proliferation Marker Ki67: 12%  HER2 - **POSITIVE** RATIO OF HER2/CEP17 SIGNALS 2.55 AVERAGE HER2 COPY NUMBER PER CELL 4.85    06/25/2017 Genetic Testing    Negative genetic testing on the 9 gene STAT panel.  The STAT Breast cancer panel offered by [facility] includes sequencing and rearrangement analysis for the following 9 genes:  ATM, BRCA1, BRCA2, CDH1, CHEK2, PALB2, PTEN, STK11 and TP53.   The report date is June 25, 2017.  Negative genetic testing on the common hereditary cancer panel.  The Hereditary Gene Panel offered by [facility] includes sequencing and/or deletion duplication testing of the following 46 genes: APC, ATM, AXIN2, BARD1, BMPR1A, BRCA1, BRCA2, BRIP1, CDH1, CDKN2A (p14ARF), CDKN2A (p16INK4a), CHEK2, CTNNA1, DICER1, EPCAM (Deletion/duplication testing only), GREM1 (promoter region deletion/duplication testing only), KIT, MEN1, MLH1, MSH2, MSH3, MSH6, MUTYH, NBN, NF1, NHTL1, PALB2, PDGFRA, PMS2, POLD1, POLE, PTEN, RAD50, RAD51C, RAD51D, SDHB, SDHC, SDHD, SMAD4, SMARCA4. STK11, TP53, TSC1, TSC2, and VHL.  The following genes were evaluated for sequence changes only: SDHA and HOXB13 c.251G>A variant only.  The report date is June 25, 2017.     07/02/2017 Imaging    CT cAP 07/02/17 IMPRESSION: 1. Subareolar right breast mass. No compelling findings of nodal or metastatic involvement. 2. Old granulomatous disease. 3. There is a 7 mm enhancing or hyperdense focus posteriorly in segment 7 of the liver in the subcapsular region. By virtue of its small size this lesion is technically nonspecific although statistically likely to be a small benign lesions such as flash filling hemangioma. This may warrant surveillance. 4.  Prominent stool throughout the colon favors constipation. 5. Degenerative disc disease and spondylosis at L5-S1 likely causing mild impingement.  07/02/2017 Imaging    Bone scan 07/02/17 IMPRESSION: Today' s exam is  negative. I ascribed the tiny focus of activity just proximal to the left antecubital region to injection site.    07/22/2017 Surgery    RIGHT TOTAL MASTECTOMY WITH RIGHT AXILLARY SENTINEL LYMPH NODE BIOPSY, LEFT PROPHYLACTIC MASTECTOM and INSERTION PORT-A-CATH WITH [name] by [name] and [facility]     07/22/2017 Pathology Results    Diagnosis 1. Breast, simple mastectomy, Left - FIBROADENOMA. - NO MALIGNANCY IDENTIFIED. 2. 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 - ADDITIONAL FOCUS OF LOBULAR CARCINOMA, GRADE 2, SPANNING 2.2 CM. - INTERMEDIATE GRADE DUCTAL CARCINOMA IN SITU. - INVASIVE CARCINOMA COMES TO WITHIN 0.2 TO 0.3 CM OF THE DEEP MARGIN, FOCALLY. - TUMOR FOCALLY INVOLVES EPIDERMIS. - LYMPHOVASCULAR INVASION OF DERMAL LYMPHATICS. - ONE OF ONE LYMPH NODES NEGATIVE FOR CARCINOMA (0/1). - SEE ONCOLOGY TABLE. 3. Lymph node, sentinel, biopsy, Right axillary - ONE LYMPH NODE WITH ISOLATED TUMOR CELLS (0/1). 4. Lymph node, sentinel, biopsy, Right - METASTATIC CARCINOMA IN ONE OF ONE LYMPH NODES (1/1). 5. Lymph node, sentinel, biopsy, Right - ONE OF ONE LYMPH NODES NEGATIVE FOR CARCINOMA (0/1). 6. Lymph node, sentinel, biopsy, Right - METASTATIC CARCINOMA IN ONE OF ONE LYMPH NODES (1/1). - EXTRACAPSULAR EXTENSION. 7. Lymph node, sentinel, biopsy, Right - METASTATIC CARCINOMA IN ONE OF ONE LYMPH NODES (1/1). - EXTRACAPSULAR EXTENSION. 8. Lymph node, sentinel, biopsy, Right - ONE OF ONE LYMPH NODES NEGATIVE FOR CARCINOMA (0/1). 9. Lymph node, sentinel, biopsy, Right - ONE OF ONE LYMPH NODES NEGATIVE FOR CARCINOMA (0/1). 10. Lymph node, sentinel, biopsy, Right - METASTATIC CARCINOMA IN ONE OF ONE LYMPH NODES (1/1). - EXTRACAPSULAR EXTENSION. 11. Lymph node, biopsy, Right axillary - ONE OF ONE LYMPH NODES NEGATIVE FOR CARCINOMA (0/1).     07/22/2017 Receptors her2    Ductal carcinoma in primary breast tumor and node  metastasis are ER 95%, PR 95% strongly positive, and HER2 + The lobular component in primary breast tumor is ER 95% positive, PR 95% positive, strong staining, HER-2 negative.   Original biopsy [id number]: Ductal morphology, Her2 FISH positive. Current specimen: Main focus is 75% ductal morphology, 25% lobular morphology. Smaller focus is lobular morphology. #2A Lobular morphology, Her IHC negative. #2E Lobular morphology, Her2 FISH negative, Her2 IHC negative. #4 50% lobular and 50% ductal morphology, Her2 FISH positive, Her2 IHC negative. #6 Ductal morphology #7 Ductal morphology #10 50% lobular, 50% ductal morphology    08/25/2017 Imaging    MRI abdomen done at [facility]:  Enhancing structure measing 5 mm is compatible with a flash filling hemangioma. No specific findings to suggest metastatic cancer    08/25/2017 Imaging    MRI Abdomen at [facility] 08/25/17  IMPRESSION: 1. Lesion in segment 7 of the liver has signal and enhancement characteristics compatible with a flash filling hemangioma. No specific findings identified to suggest metastatic disease.    08/29/2017 - 12/12/2017 Chemotherapy    adjuvant TCHP every 3 weeks for 6 cycles starting 08/29/17, followed by maintenance Herceptin with or without Perjeta for 6-12 months. Postponed and decreased Carbo to 642m and added Onpro with cycle 2 on 09/19/17 due to neutropenic fever. Carbplatin further reduced to 5086mstarting with cycle 3 due to thrombocytopenia. Herceptin was held for Cycle 4 and 5 due to worsening global longitudinal strain found on ECHO from 11/07/17.        09/04/2017 - 09/09/2017 Hospital Admission    Admit date: 09/04/2017  Discharge date: 09/09/2017  DISCHARGE DIAGNOSES:  Principal Problem:   Sepsis [facility]) Active Problems:   Malignant neoplasm of upper-outer quadrant of right breast in female, estrogen receptor positive ([place])   Port-A-Cath in place   Neutropenic fever ([place])   Hyponatremia   Malignant  neoplasm of breast in female, estrogen receptor positive ([place])     12/28/2017 Imaging    CT CHEST IMPRESSION: 1. 2.3 x 2.7 x 1.2 cm irregular masslike opacity within the right middle lobe worrisome for malignancy/metastasis. 2. 3 new very small sclerotic lesions, within the sternum and the body of T4, worrisome for new metastases.    12/28/2017 - 01/05/2018 Hospital Admission    Admit date: 12/28/17-01/05/18 Admission diagnosis: multifocal pneumonia, influenza A, sepsis, hypoxia Additional comments: imaging during hospitalization notable for new irregular masslike opacity in the right middle lobe worrisome for metastasis and new very small sclerotic lesion within the sternum and the body of T4, worrisome for metastasis    12/29/2017 Imaging    CT ABD/PELVIS IMPRESSION: No definite acute intra-abdominal or intrapelvic abnormalities.  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    12/30/2017 Imaging    CT HEAD IMPRESSION: No acute intracranial abnormality or enhancing lesion.    02/04/2018 PET scan    IMPRESSION: 1. The previous consolidation in the right middle lobe currently has a more nodular and reticular appearance with some associated mild atelectasis along its anterior margin. This demonstrates low-grade metabolic activity with maximum SUV 3.2 (background blood pool activity is 2.7). Although conceivably related to low-grade residuum from the patient's malignancy, the presence of calcified right hilar lymph nodes and small calcifications along the nodularity raise suspicion for a low-grade active granulomatous process or atypical infectious process. Surveillance is recommended. 2. Scattered deposits of hypermetabolic but benign metabolically active brown fat.    03/20/2018 -  Anti-estrogen oral therapy    -Monthly Zoladex injection starting 03/20/18  -Letrozole 2.52m once dialy starting 05/03/18     04/06/2018 Imaging    CT Chest wo  contrast IMPRESSION: 1. Stable linear nodular thickening in the RIGHT middle lobe is favored post infectious or inflammatory scarring. 2. Calcified RIGHT hilar lymph node is consistent post infectious process. 3. Stable small LEFT upper lobe nodule. 4. Stable sclerotic lesion in the manubrium. Sclerotic lesion at T3 is more prominent (4 mm). Sclerotic lesion at L 1 is new from 07/02/2017.    04/10/2018 Echocardiogram    LV EF 45%-50%    05/25/2018 Echocardiogram    LV EF 45%-50%    07/13/2018 Echocardiogram    Normal LV size with EF 55-60%   CURRENT THERAPY:   -Herceptin and Perjeta maintenance therapy every 3 weeks. Held 03/2018-07/17/18 due to low EF%.  -Monthly Zoladex injections starting 03/20/18  -Letrozole 2.590monce daily starting 05/03/18   INTERVAL HISTORY: Ms. LiEddingtoneturns for follow up and herceptin/perjeta as scheduled. She restarted on 07/17/18. She tolerated well. She is asking to stop benadryl due to intense restless leg. She is drinking and eating well. Has dry mouth, no mucositis. Denies n/v/c/d. No recent fever, chills, cough, chest pain, or dyspnea. Hot flashes are improved with increased dose effexor. She continues to have pain from from lower middle-to right side that radiates up her spine to mid back, occasional spasm. Rarely does it radiate to other places. At rest, pain is 2/10, increases to 4-6/10 with movement. Has tried cold compression, NSAIDs without much improvement. Pain began a few months ago. No injury or trauma.  MEDICAL HISTORY:  Past Medical History:  Diagnosis Date  . Anxiety   . Cancer [facility])    right breast   . Cardiomyopathy ([place])   . Family history of breast cancer   . Family history of colon cancer   . GERD (gastroesophageal reflux disease)   . Headache    couple of migraines in the past  . History of kidney stones   . Vitamin D deficiency     SURGICAL HISTORY: Past Surgical History:  Procedure Laterality Date  . egg donation    .  kidney stone removal    . MASTECTOMY W/ SENTINEL NODE BIOPSY Bilateral 07/22/2017   Procedure: RIGHT TOTAL MASTECTOMY WITH RIGHT AXILLARY SENTINEL LYMPH NODE BIOPSY, LEFT PROPHYLACTIC MASTECTOMY;  Surgeon: [name], MD;  Location: [place];  Service: General;  Laterality: Bilateral;  . PORTACATH PLACEMENT Right 07/22/2017   Procedure: INSERTION PORT-A-CATH WITH [name];  Surgeon: [name], MD;  Location: [place];  Service: General;  Laterality: Right;    I have reviewed the social history and family history with the patient and they are unchanged from previous note.  ALLERGIES:  has No Known Allergies.  MEDICATIONS:  Current Outpatient Medications  Medication Sig Dispense Refill  . carvedilol (COREG) 3.125 MG tablet TAKE 1 TABLET BY MOUTH 2 TIMES DAILY. 60 tablet 3  . Cholecalciferol (VITAMIN D) 2000 units tablet Take 2,000 Units by mouth daily.    . diphenoxylate-atropine (LOMOTIL) 2.5-0.025 MG tablet Take 1-2 tablets by mouth 4 (four) times daily as needed for diarrhea or loose stools. 30 tablet 0  . fluticasone (FLONASE) 50 MCG/ACT nasal spray Place 1 spray into both nostrils daily.    Marland Kitchen letrozole (FEMARA) 2.5 MG tablet TAKE 1 TABLET BY MOUTH EVERY DAY 30 tablet 3  . levothyroxine (SYNTHROID, LEVOTHROID) 25 MCG tablet Take 25 mcg by mouth daily before breakfast.    . lidocaine-prilocaine (EMLA) cream Apply to affected area once (Patient taking differently: Apply 1 application topically once as needed (port access). Apply to affected area once) 30 g 3  . losartan (COZAAR) 25 MG tablet TAKE 1/2 TABLET BY MOUTH AT BEDTIME. 15 tablet 3  . Melatonin 10 MG TABS Take 1 tablet by mouth.     . naproxen sodium (ANAPROX) 220 MG tablet Take 220 mg 2 (two) times daily as needed by mouth (pain).     . Omega-3 Fatty Acids (OMEGA-3 FISH OIL PO) Take 100 Units by mouth.    . prochlorperazine (COMPAZINE) 10 MG tablet Take 1 tablet (10 mg total) by mouth every 6 (six) hours as needed (Nausea or  vomiting). 30 tablet 1  . venlafaxine XR (EFFEXOR-XR) 150 MG 24 hr capsule TAKE 1 CAPSULE (150 MG TOTAL) BY MOUTH DAILY WITH BREAKFAST. 30 capsule 3  . anastrozole (ARIMIDEX) 1 MG tablet Take 1 tablet (1 mg total) by mouth daily. 30 tablet 1  . cyclobenzaprine (FLEXERIL) 5 MG tablet Take 1 tablet (5 mg total) by mouth 2 (two) times daily as needed for muscle spasms. 30 tablet 0   Current Facility-Administered Medications  Medication Dose Route Frequency Provider Last Rate Last Dose  . gi cocktail (Maalox,Lidocaine,Donnatal)  30 mL Oral Once Harle Stanford., PA-C        PHYSICAL EXAMINATION: ECOG PERFORMANCE STATUS: 1 - Symptomatic but completely ambulatory  Vitals:   08/06/18 1344  BP: (!) 97/47  Pulse: (!) 106  Resp: 18  Temp: 98.1 F (36.7 C)  SpO2: 100%   Filed Weights   08/06/18  1344  Weight: 158 lb 14.4 oz (72.1 kg)    GENERAL:alert, no distress and comfortable SKIN: no rashes or significant lesions EYES: normal, Conjunctiva are pink and non-injected, sclera clear OROPHARYNX:no thrush or ulcers LYMPH:  no palpable cervical, supraclavicular, or axillary lymphadenopathy  LUNGS: clear to auscultation with normal breathing effort HEART: tachycardic, regular rhythm; no lower extremity edema ABDOMEN:abdomen soft, non-tender and normal bowel sounds Musculoskeletal:no cyanosis of digits and no clubbing  NEURO: alert & oriented x 3 with fluent speech, no focal motor/sensory deficits Breast exam: s/p bilat mastectomy. Incisions are well healed. Right breast with slight hyperpigmentation secondary to radiation. No palpable masses or axillary adenopathy.  PAC without erythema   LABORATORY DATA:  I have reviewed the data as listed CBC Latest Ref Rng & Units 08/06/2018 07/17/2018 05/29/2018  WBC 3.9 - 10.3 K/uL 2.9(L) 2.8(L) 4.6  Hemoglobin 11.6 - 15.9 g/dL 11.7 10.9(L) 11.6  Hematocrit 34.8 - 46.6 % 34.6(L) 33.0(L) 34.1(L)  Platelets 145 - 400 K/uL 180 170 187     CMP Latest Ref  Rng & Units 08/06/2018 07/17/2018 05/29/2018  Glucose 70 - 99 mg/dL 143(H) 146(H) 93  BUN 6 - 20 mg/dL _0 Creatinine 0.44 - 1.00 mg/dL 1.03(H) 0.96 1.02(H)  Sodium 135 - 145 mmol/L 139 140 139  Potassium 3.5 - 5.1 mmol/L 3.8 3.9 4.0  Chloride 98 - 111 mmol/L 107 106 104  CO2 22 - 32 mmol/L _1 Calcium 8.9 - 10.3 mg/dL 9.4 9.2 9.8  Total Protein 6.5 - 8.1 g/dL 7.4 6.8 7.1  Total Bilirubin 0.3 - 1.2 mg/dL 0.4 0.3 0.4  Alkaline Phos 38 - 126 U/L 117 96 109  AST 15 - 41 U/L 12(L) 11(L) 13(L)  ALT 0 - 44 U/L _2 RADIOGRAPHIC STUDIES: I have personally reviewed the radiological images as listed and agreed with the findings in the report. No results found.   ASSESSMENT & PLAN: Elbia Paro is a 42 y.o. premenopausal female with a history of Vitamin D deficiency and high cholesterol and kidney stones, presented with a palpable right breast mass.   1. Malignant neoplasm of upper-outer quadrant of right breast in female, mixed invasive ductal and lobular carcinoma, pT3(m)N2aM0, stage 1b, ductal carcinoma triple positive, lobular carcinoma ER+/PR+/HER2-,  Grade 2 -Ms. Bookbinder is doing well. She continues herceptin and perjeta q3 weeks without significant toxicities. She restarted therapy after cardiac clearance on 8/30, so tolerated well.  -Given her ER/PR positive tumor and her young age, I recommend ovarian suppression and adjuvant antiestrogen therapy with AI. She has started Zoladex on 03/20/2018   -CT Chest wo contrast, 04/06/2018 was previously reviewed by Dr. Burr Medico which showed: Stable linear nodular thickening in the RIGHT middle lobe is favored post infectious or inflammatory scarring. Calcified RIGHT hilar lymph node is consistent post infectious process. Stable small LEFT upper lobe nodule. Stable sclerotic lesion in the manubrium. Sclerotic lesion at T3 is more prominent (4 mm). Sclerotic lesion at L 1 is new from 07/02/2017. I reviewed her scan findings with pt and  her husband. I agree with the radiology interpretation. Her bone lesions are indeterminate, previously negative on PET, will be followed in 3 months with CT and bone scan.  -I reviewed her 07/31/18 CT CAP and bone scan with her and her spouse today, which shows residual linear and nodular peribronchovascular opacities in the right middle lobe with other scattered areas of ground glass opacities in  the right middle, right upper, and right lower lobes, which are located within the previously demonstrated bulky airspace consolidation and improved from 12/2017 CT; these may represent post infectious/post inflammatory changes, residual malignancy is not ruled out. The calcified right hilar lymphadenopathy is stable. There is interval development of nodular soft tissue thickening within the anterior mediastinum in the area of the thymus which is new and may represent reactive thymic tissue, thymoma, or lymphadenopathy. There is 1.8 cm probably physiologic left ovarian cyst and borderline thickening of the endometrium; I discussed with Joylene John, gyn/onc NP who agrees with recommendation for pelvic US, I ordered today.   -Dr. Burr Medico and I reviewed the images prior to this office visit, but the report was not available yet. Overall, she recommends to continue current therapy. I will review the report with Dr. Burr Medico when she returns to the office.  -She started adjuvant Letrozole on 05/03/18.  She is on Zoladex injection for ovarian suppression, she tolerates well other than hot flashes, controlled on effexor. However, given her diffuse back pain and negative bone scan, I recommend to hold letrozole for 2 weeks to see if back pain improves, then switch to anastrozole. She agrees.  -Continue with Zoladex injection; she is planning to undergo BSO per Dr. Gerarda Fraction in December.  -labs reviewed, adequate to proceed with herceptin and perjeta today, zoladex due in 1 week -she will return for F/u in 3 weeks   2. Genetics -She  was negative for all mutations tested   3. Insomnia   4. Hot flash  -Improved on increased dose Effexor to 150 mg   5. Bone Health   6. Diffuse Back and neck pain/soreness  -Her CT Chest on 04/06/18 showed sclerotic lesions of spine in T3 and L1.  -No evidence of osseous metastasis on 07/31/18 bone scan -I recommend she hold letrozole for 2 weeks to see if back pain improves, then switch to anastrozole, I prescribed today. -I prescribed flexeril to see if muscle relaxer will help given occasional back spasm.   PLAN: -Labs and imaging reviewed -Proceed with herceptin and perjeta today, OK to hold benadryl  -zoladex in 1 week -Pelvic US in 1 week, I discussed with Joylene John, gyn/onc NP -Lab, flush, f/u with Dr. Burr Medico, and herceptin/perjeta in 3 weeks  -Flu vac today -hold letrozole for 2 weeks for back pain, begin anastrozole     Orders Placed This Encounter  Procedures  . US PELVIC COMPLETE WITH TRANSVAGINAL    Standing Status:   Future    Standing Expiration Date:   10/07/2019    Order Specific Question:   Reason for Exam (SYMPTOM  OR DIAGNOSIS REQUIRED)    Answer:   left ovarian cyst and borderline thickened endometrium    Order Specific Question:   Preferred imaging location?    Answer:   [facility]    All questions were answered. The patient knows to call the clinic with any problems, questions or concerns. No barriers to learning was detected. I spent 20 minutes counseling the patient face to face. The total time spent in the appointment was and more than 50% was on counseling and review of test results     [name], NP 08/07/18

## 2018-08-06 NOTE — Patient Instructions (Signed)
[facility] Discharge Instructions for Patients Receiving Chemotherapy  Today you received the following chemotherapy agents :  Herceptin, Perjeta.  To help prevent nausea and vomiting after your treatment, we encourage you to take your nausea medication as prescribed.   If you develop nausea and vomiting that is not controlled by your nausea medication, call the clinic.   BELOW ARE SYMPTOMS THAT SHOULD BE REPORTED IMMEDIATELY:  *FEVER GREATER THAN 100.5 F  *CHILLS WITH OR WITHOUT FEVER  NAUSEA AND VOMITING THAT IS NOT CONTROLLED WITH YOUR NAUSEA MEDICATION  *UNUSUAL SHORTNESS OF BREATH  *UNUSUAL BRUISING OR BLEEDING  TENDERNESS IN MOUTH AND THROAT WITH OR WITHOUT PRESENCE OF ULCERS  *URINARY PROBLEMS  *BOWEL PROBLEMS  UNUSUAL RASH Items with * indicate a potential emergency and should be followed up as soon as possible.  Feel free to call the clinic should you have any questions or concerns. The clinic phone number is [phone].  Please show the [facility] at check-in to the Emergency Department and triage nurse.  Goserelin injection What is this medicine? GOSERELIN (GOE se rel in) is similar to a hormone found in the body. It lowers the amount of sex hormones that the body makes. Men will have lower testosterone levels and women will have lower estrogen levels while taking this medicine. In men, this medicine is used to treat prostate cancer; the injection is either given once per month or once every 12 weeks. A once per month injection (only) is used to treat women with endometriosis, dysfunctional uterine bleeding, or advanced breast cancer. This medicine may be used for other purposes; ask your health care provider or pharmacist if you have questions. COMMON BRAND NAME(S): Zoladex What should I tell my health care provider before I take this medicine? They need to know if you have any of these conditions (some only apply to  women): -diabetes -heart disease or previous heart attack -high blood pressure -high cholesterol -kidney disease -osteoporosis or low bone density -problems passing urine -spinal cord injury -stroke -tobacco smoker -an unusual or allergic reaction to goserelin, hormone therapy, other medicines, foods, dyes, or preservatives -pregnant or trying to get pregnant -breast-feeding How should I use this medicine? This medicine is for injection under the skin. It is given by a health care professional in a hospital or clinic setting. Men receive this injection once every 4 weeks or once every 12 weeks. Women will only receive the once every 4 weeks injection. Talk to your pediatrician regarding the use of this medicine in children. Special care may be needed. Overdosage: If you think you have taken too much of this medicine contact a poison control center or emergency room at once. NOTE: This medicine is only for you. Do not share this medicine with others. What if I miss a dose? It is important not to miss your dose. Call your doctor or health care professional if you are unable to keep an appointment. What may interact with this medicine? -female hormones like estrogen -herbal or dietary supplements like black cohosh, chasteberry, or DHEA -female hormones like testosterone -prasterone This list may not describe all possible interactions. Give your health care provider a list of all the medicines, herbs, non-prescription drugs, or dietary supplements you use. Also tell them if you smoke, drink alcohol, or use illegal drugs. Some items may interact with your medicine. What should I watch for while using this medicine? Visit your doctor or health care professional for regular checks on your progress.  Your symptoms may appear to get worse during the first weeks of this therapy. Tell your doctor or healthcare professional if your symptoms do not start to get better or if they get worse after this  time. Your bones may get weaker if you take this medicine for a long time. If you smoke or frequently drink alcohol you may increase your risk of bone loss. A family history of osteoporosis, chronic use of drugs for seizures (convulsions), or corticosteroids can also increase your risk of bone loss. Talk to your doctor about how to keep your bones strong. This medicine should stop regular monthly menstration in women. Tell your doctor if you continue to [facility]. Women should not become pregnant while taking this medicine or for 12 weeks after stopping this medicine. Women should inform their doctor if they wish to become pregnant or think they might be pregnant. There is a potential for serious side effects to an unborn child. Talk to your health care professional or pharmacist for more information. Do not breast-feed an infant while taking this medicine. Men should inform their doctors if they wish to father a child. This medicine may lower sperm counts. Talk to your health care professional or pharmacist for more information. What side effects may I notice from receiving this medicine? Side effects that you should report to your doctor or health care professional as soon as possible: -allergic reactions like skin rash, itching or hives, swelling of the face, lips, or tongue -bone pain -breathing problems -changes in vision -chest pain -feeling faint or lightheaded, falls -fever, chills -pain, swelling, warmth in the leg -pain, tingling, numbness in the hands or feet -signs and symptoms of low blood pressure like dizziness; feeling faint or lightheaded, falls; unusually weak or tired -stomach pain -swelling of the ankles, feet, hands -trouble passing urine or change in the amount of urine -unusually high or low blood pressure -unusually weak or tired Side effects that usually do not require medical attention (report to your doctor or health care professional if they continue or are  bothersome): -change in sex drive or performance -changes in breast size in both males and females -changes in emotions or moods -headache -hot flashes -irritation at site where injected -loss of appetite -skin problems like acne, dry skin -vaginal dryness This list may not describe all possible side effects. Call your doctor for medical advice about side effects. You may report side effects to FDA at 1-800-FDA-1088. Where should I keep my medicine? This drug is given in a hospital or clinic and will not be stored at home. NOTE: This sheet is a summary. It may not cover all possible information. If you have questions about this medicine, talk to your doctor, pharmacist, or health care provider.  2018 [employer] (2014-01-11 11:10:35)

## 2018-08-07 ENCOUNTER — Encounter: Payer: Self-pay | Admitting: Nurse Practitioner

## 2018-08-07 ENCOUNTER — Telehealth: Payer: Self-pay | Admitting: Hematology

## 2018-08-07 NOTE — Telephone Encounter (Signed)
Appt scheduled patient notified per 9/19 los

## 2018-08-13 ENCOUNTER — Inpatient Hospital Stay: Payer: BLUE CROSS/BLUE SHIELD

## 2018-08-13 ENCOUNTER — Ambulatory Visit (HOSPITAL_COMMUNITY)
Admission: RE | Admit: 2018-08-13 | Discharge: 2018-08-13 | Disposition: A | Discharge status: Home / Self Care | Payer: BLUE CROSS/BLUE SHIELD | Source: Ambulatory Visit | Attending: Nurse Practitioner | Admitting: Nurse Practitioner

## 2018-08-13 DIAGNOSIS — Z95828 Presence of other vascular implants and grafts: Secondary | ICD-10-CM

## 2018-08-13 DIAGNOSIS — R9389 Abnormal findings on diagnostic imaging of other specified body structures: Secondary | ICD-10-CM | POA: Diagnosis not present

## 2018-08-13 DIAGNOSIS — C50411 Malignant neoplasm of upper-outer quadrant of right female breast: Secondary | ICD-10-CM | POA: Diagnosis not present

## 2018-08-13 DIAGNOSIS — Z79811 Long term (current) use of aromatase inhibitors: Secondary | ICD-10-CM | POA: Diagnosis not present

## 2018-08-13 DIAGNOSIS — Z170 Estrogen receptor positive status [ER+]: Secondary | ICD-10-CM | POA: Diagnosis not present

## 2018-08-13 DIAGNOSIS — Z5112 Encounter for antineoplastic immunotherapy: Secondary | ICD-10-CM | POA: Diagnosis not present

## 2018-08-13 DIAGNOSIS — N83202 Unspecified ovarian cyst, left side: Secondary | ICD-10-CM | POA: Diagnosis not present

## 2018-08-13 DIAGNOSIS — Z5111 Encounter for antineoplastic chemotherapy: Secondary | ICD-10-CM | POA: Diagnosis not present

## 2018-08-13 DIAGNOSIS — Z9013 Acquired absence of bilateral breasts and nipples: Secondary | ICD-10-CM | POA: Diagnosis not present

## 2018-08-13 DIAGNOSIS — Z9221 Personal history of antineoplastic chemotherapy: Secondary | ICD-10-CM | POA: Diagnosis not present

## 2018-08-13 DIAGNOSIS — N83292 Other ovarian cyst, left side: Secondary | ICD-10-CM | POA: Diagnosis not present

## 2018-08-13 DIAGNOSIS — M549 Dorsalgia, unspecified: Secondary | ICD-10-CM | POA: Diagnosis not present

## 2018-08-13 DIAGNOSIS — R918 Other nonspecific abnormal finding of lung field: Secondary | ICD-10-CM | POA: Diagnosis not present

## 2018-08-13 DIAGNOSIS — Z79899 Other long term (current) drug therapy: Secondary | ICD-10-CM | POA: Diagnosis not present

## 2018-08-13 DIAGNOSIS — N951 Menopausal and female climacteric states: Secondary | ICD-10-CM | POA: Diagnosis not present

## 2018-08-13 MED ORDER — GOSERELIN ACETATE 3.6 MG ~~LOC~~ IMPL
3.6000 mg | DRUG_IMPLANT | Freq: Once | SUBCUTANEOUS | Status: AC
  Administered 2018-08-13: 3.6 mg via SUBCUTANEOUS

## 2018-08-14 ENCOUNTER — Telehealth: Payer: Self-pay

## 2018-08-14 NOTE — Telephone Encounter (Signed)
Outgoing call per Joylene John NP regarding recent pelvic ultrasound, results are "normal"  - pt voiced understanding and no other needs per pt at this time.

## 2018-08-19 ENCOUNTER — Telehealth: Payer: Self-pay | Admitting: Hematology

## 2018-08-19 NOTE — Telephone Encounter (Signed)
[facility] CME 11/1 - moved appointments from 11/1 to 10/31. Spoke with patient she is aware and will get updated scheduled at 10/10 visit.

## 2018-08-20 DIAGNOSIS — C50111 Malignant neoplasm of central portion of right female breast: Secondary | ICD-10-CM | POA: Diagnosis not present

## 2018-08-26 NOTE — Progress Notes (Signed)
Sheila [name]  [phone] [phone]  Clinic Follow Up Note   Patient Care Team: [name], MD as PCP - General (Family Medicine) [name] (Family Medicine) [name], MD as Consulting Physician (General Surgery) [name], MD as Consulting Physician (Hematology) Kyung Rudd, MD as Consulting Physician (Radiation Oncology)   Date of Service:  08/27/2018  CHIEF COMPLAINTS:  Follow up right breast cancer    Oncology History    Cancer Staging Malignant neoplasm of upper-outer quadrant of right breast in female, estrogen receptor positive ([place]) Staging form: Breast, AJCC 8th Edition - Clinical stage from 06/11/2017: Stage IB (cT3, cN0, cM0, G2, ER: Positive, PR: Positive, HER2: Positive) - Signed by [name], MD on 06/16/2017 - Pathologic stage from 07/22/2017: Stage IB (pT3(m), pN2a(sn), cM0, G2, ER: Positive, PR: Positive, HER2: Positive) - Signed by [name], NP on 08/04/2017      Malignant neoplasm of upper-outer quadrant of right breast in female, estrogen receptor positive ([place])   06/11/2017 Initial Biopsy    Diagnosis 06/11/17 Breast, right, needle core biopsy, 11:00 o'clock - INVASIVE DUCTAL CARCINOMA, G2    06/11/2017 Mammogram    [name] and [facility] Diagnostic Breast Tomo Bilateral 06/11/17 IMPRESSION: 1. Highly suspicious mass within the retroareolar right breast, extending from the 8:00 to 11:00 axes, anterior to posterior depth, extending anteriorly to the nipple with associated nipple retraction, measuring at least 5.5 cm by ultrasound  2. No sonographic evidence of metastatic lymphadenopathy in the right axilla. 3. No evidence of malignancy within the left breast.     06/11/2017 Initial Diagnosis    Malignant neoplasm of upper-outer quadrant of right breast in female, estrogen receptor positive ([place])    06/11/2017 Receptors her2    Estrogen Receptor: 100%, POSITIVE, STRONG STAINING INTENSITY Progesterone  Receptor: 100%, POSITIVE, STRONG STAINING INTENSITY Proliferation Marker Ki67: 12%Proliferation Marker Ki67: 12%  HER2 - **POSITIVE** RATIO OF HER2/CEP17 SIGNALS 2.55 AVERAGE HER2 COPY NUMBER PER CELL 4.85    06/25/2017 Genetic Testing    Negative genetic testing on the 9 gene STAT panel.  The STAT Breast cancer panel offered by [facility] includes sequencing and rearrangement analysis for the following 9 genes:  ATM, BRCA1, BRCA2, CDH1, CHEK2, PALB2, PTEN, STK11 and TP53.   The report date is June 25, 2017.  Negative genetic testing on the common hereditary cancer panel.  The Hereditary Gene Panel offered by [facility] includes sequencing and/or deletion duplication testing of the following 46 genes: APC, ATM, AXIN2, BARD1, BMPR1A, BRCA1, BRCA2, BRIP1, CDH1, CDKN2A (p14ARF), CDKN2A (p16INK4a), CHEK2, CTNNA1, DICER1, EPCAM (Deletion/duplication testing only), GREM1 (promoter region deletion/duplication testing only), KIT, MEN1, MLH1, MSH2, MSH3, MSH6, MUTYH, NBN, NF1, NHTL1, PALB2, PDGFRA, PMS2, POLD1, POLE, PTEN, RAD50, RAD51C, RAD51D, SDHB, SDHC, SDHD, SMAD4, SMARCA4. STK11, TP53, TSC1, TSC2, and VHL.  The following genes were evaluated for sequence changes only: SDHA and HOXB13 c.251G>A variant only.  The report date is June 25, 2017.     07/02/2017 Imaging    CT cAP 07/02/17 IMPRESSION: 1. Subareolar right breast mass. No compelling findings of nodal or metastatic involvement. 2. Old granulomatous disease. 3. There is a 7 mm enhancing or hyperdense focus posteriorly in segment 7 of the liver in the subcapsular region. By virtue of its small size this lesion is technically nonspecific although statistically likely to be a small benign lesions such as flash filling hemangioma. This may warrant surveillance. 4.  Prominent stool throughout the colon favors constipation. 5. Degenerative disc disease and spondylosis  at L5-S1 likely causing mild impingement.    07/02/2017 Imaging    Bone scan  07/02/17 IMPRESSION: Today' s exam is negative. I ascribed the tiny focus of activity just proximal to the left antecubital region to injection site.    07/22/2017 Surgery    RIGHT TOTAL MASTECTOMY WITH RIGHT AXILLARY SENTINEL LYMPH NODE BIOPSY, LEFT PROPHYLACTIC MASTECTOM and INSERTION PORT-A-CATH WITH [name] by [name] and [facility]     07/22/2017 Pathology Results    Diagnosis 1. Breast, simple mastectomy, Left - FIBROADENOMA. - NO MALIGNANCY IDENTIFIED. 2. 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 - ADDITIONAL FOCUS OF LOBULAR CARCINOMA, GRADE 2, SPANNING 2.2 CM. - INTERMEDIATE GRADE DUCTAL CARCINOMA IN SITU. - INVASIVE CARCINOMA COMES TO WITHIN 0.2 TO 0.3 CM OF THE DEEP MARGIN, FOCALLY. - TUMOR FOCALLY INVOLVES EPIDERMIS. - LYMPHOVASCULAR INVASION OF DERMAL LYMPHATICS. - ONE OF ONE LYMPH NODES NEGATIVE FOR CARCINOMA (0/1). - SEE ONCOLOGY TABLE. 3. Lymph node, sentinel, biopsy, Right axillary - ONE LYMPH NODE WITH ISOLATED TUMOR CELLS (0/1). 4. Lymph node, sentinel, biopsy, Right - METASTATIC CARCINOMA IN ONE OF ONE LYMPH NODES (1/1). 5. Lymph node, sentinel, biopsy, Right - ONE OF ONE LYMPH NODES NEGATIVE FOR CARCINOMA (0/1). 6. Lymph node, sentinel, biopsy, Right - METASTATIC CARCINOMA IN ONE OF ONE LYMPH NODES (1/1). - EXTRACAPSULAR EXTENSION. 7. Lymph node, sentinel, biopsy, Right - METASTATIC CARCINOMA IN ONE OF ONE LYMPH NODES (1/1). - EXTRACAPSULAR EXTENSION. 8. Lymph node, sentinel, biopsy, Right - ONE OF ONE LYMPH NODES NEGATIVE FOR CARCINOMA (0/1). 9. Lymph node, sentinel, biopsy, Right - ONE OF ONE LYMPH NODES NEGATIVE FOR CARCINOMA (0/1). 10. Lymph node, sentinel, biopsy, Right - METASTATIC CARCINOMA IN ONE OF ONE LYMPH NODES (1/1). - EXTRACAPSULAR EXTENSION. 11. Lymph node, biopsy, Right axillary - ONE OF ONE LYMPH NODES NEGATIVE FOR CARCINOMA (0/1).     07/22/2017 Receptors her2    Ductal  carcinoma in primary breast tumor and node metastasis are ER 95%, PR 95% strongly positive, and HER2 + The lobular component in primary breast tumor is ER 95% positive, PR 95% positive, strong staining, HER-2 negative.   Original biopsy [id number]: Ductal morphology, Her2 FISH positive. Current specimen: Main focus is 75% ductal morphology, 25% lobular morphology. Smaller focus is lobular morphology. #2A Lobular morphology, Her IHC negative. #2E Lobular morphology, Her2 FISH negative, Her2 IHC negative. #4 50% lobular and 50% ductal morphology, Her2 FISH positive, Her2 IHC negative. #6 Ductal morphology #7 Ductal morphology #10 50% lobular, 50% ductal morphology    08/25/2017 Imaging    MRI abdomen done at [facility]:  Enhancing structure measing 5 mm is compatible with a flash filling hemangioma. No specific findings to suggest metastatic cancer    08/25/2017 Imaging    MRI Abdomen at [facility] 08/25/17  IMPRESSION: 1. Lesion in segment 7 of the liver has signal and enhancement characteristics compatible with a flash filling hemangioma. No specific findings identified to suggest metastatic disease.    08/29/2017 - 12/12/2017 Chemotherapy    adjuvant TCHP every 3 weeks for 6 cycles starting 08/29/17, followed by maintenance Herceptin with or without Perjeta for 6-12 months. Postponed and decreased Carbo to 69m and added Onpro with cycle 2 on 09/19/17 due to neutropenic fever. Carbplatin further reduced to 5034mstarting with cycle 3 due to thrombocytopenia. Herceptin was held for Cycle 4 and 5 due to worsening global longitudinal strain found on ECHO from 11/07/17.        09/04/2017 -  09/09/2017 Hospital Admission    Admit date: 09/04/2017 Discharge date: 09/09/2017  DISCHARGE DIAGNOSES:  Principal Problem:   Sepsis [facility]) Active Problems:   Malignant neoplasm of upper-outer quadrant of right breast in female, estrogen receptor positive ([place])   Port-A-Cath in place   Neutropenic fever  ([place])   Hyponatremia   Malignant neoplasm of breast in female, estrogen receptor positive ([place])     12/28/2017 Imaging    CT CHEST IMPRESSION: 1. 2.3 x 2.7 x 1.2 cm irregular masslike opacity within the right middle lobe worrisome for malignancy/metastasis. 2. 3 new very small sclerotic lesions, within the sternum and the body of T4, worrisome for new metastases.    12/28/2017 - 01/05/2018 Hospital Admission    Admit date: 12/28/17-01/05/18 Admission diagnosis: multifocal pneumonia, influenza A, sepsis, hypoxia Additional comments: imaging during hospitalization notable for new irregular masslike opacity in the right middle lobe worrisome for metastasis and new very small sclerotic lesion within the sternum and the body of T4, worrisome for metastasis    12/29/2017 Imaging    CT ABD/PELVIS IMPRESSION: No definite acute intra-abdominal or intrapelvic abnormalities.  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    12/30/2017 Imaging    CT HEAD IMPRESSION: No acute intracranial abnormality or enhancing lesion.    02/04/2018 PET scan    IMPRESSION: 1. The previous consolidation in the right middle lobe currently has a more nodular and reticular appearance with some associated mild atelectasis along its anterior margin. This demonstrates low-grade metabolic activity with maximum SUV 3.2 (background blood pool activity is 2.7). Although conceivably related to low-grade residuum from the patient's malignancy, the presence of calcified right hilar lymph nodes and small calcifications along the nodularity raise suspicion for a low-grade active granulomatous process or atypical infectious process. Surveillance is recommended. 2. Scattered deposits of hypermetabolic but benign metabolically active brown fat.    03/20/2018 -  Anti-estrogen oral therapy    -Monthly Zoladex injection starting 03/20/18  -Letrozole 2.'5mg'$  once dialy starting 05/03/18  -Anastrozole  started 08/2018    04/06/2018 Imaging    CT Chest wo contrast IMPRESSION: 1. Stable linear nodular thickening in the RIGHT middle lobe is favored post infectious or inflammatory scarring. 2. Calcified RIGHT hilar lymph node is consistent post infectious process. 3. Stable small LEFT upper lobe nodule. 4. Stable sclerotic lesion in the manubrium. Sclerotic lesion at T3 is more prominent (4 mm). Sclerotic lesion at L 1 is new from 07/02/2017.    04/06/2018 Imaging    04/06/2018 CT Chest IMPRESSION: 1. Stable linear nodular thickening in the RIGHT middle lobe is favored post infectious or inflammatory scarring. 2. Calcified RIGHT hilar lymph node is consistent post infectious process. 3. Stable small LEFT upper lobe nodule. 4. Stable sclerotic lesion in the manubrium. Sclerotic lesion at T3 is more prominent (4 mm). Sclerotic lesion at L 1 is new from 07/02/2017.    04/10/2018 Echocardiogram    LV EF 45%-50%    05/25/2018 Echocardiogram    LV EF 45%-50%    07/13/2018 Echocardiogram    Normal LV size with EF 55-60%    07/31/2018 Imaging    07/31/2018 Bone Scan IMPRESSION: No evidence of skeletal metastatic disease.    07/31/2018 Imaging    07/31/2018 CT CAP IMPRESSION: Residual linear and nodular peribronchovascular opacities in the right middle lobe, with other scattered areas of ground-glass opacities in the right middle, right upper and right lower lobes, which are located within the previously demonstrated bulky airspace  consolidation. The findings are improved from the chest CT dated 12/30/2017, and relatively stable from PET-CT dated 02/04/2018. These may represent post infectious/post inflammatory changes or residual malignancy.  Stable partially calcified right hilar lymphadenopathy.  Interval development of nodular soft tissue thickening within the anterior mediastinum in the area of the thymus, new from chest CT dated 12/30/2017. This may represent reactive thymic  tissue, thymoma or lymphadenopathy.  1.8 cm probably physiologic left ovarian cyst. Borderline thickening of the endometrium measuring 15.6 mm. Further evaluation with pelvic ultrasound may be considered.  No new or suspicious findings within the abdomen.    HISTORY OF PRESENTING ILLNESS: [birth year] Sheila [name] 42 y.o. female is here because of newly diagnosed Malignant neoplasm of upper-outer quadrant of right breast. She presents to the breast clinic today with her common law husband. She felt the lump initially when she had her physical 2 weeks ago, she had felt her nipples retracted several years ago. A few months ago it has become more pronounced. She had no pain or discharge.   In the past was diagnosed with Vitamin D deficiency and high cholesterol. She previously was on mood stabilizing medication. She feel right now she does not need that medication but is overwhelmed. She donated eggs and previously had kidney stones removed. Her mother had breast cancer and was diagnosed at 19 along with paternal grandmother in her 38's.   Today she has heart burn which she is taking Prilosec and will see her GI later today. She has trouble sleeping and uses tylenol pm. She tried [place] which did not help, same with melatonin. Her common law husband Is on disability.  GYN HISTORY  Menarchal: 19 LMP: July 23rd, 2018 Contraceptive: 20-29 on birth control pill HRT: NA G0P0: does not want kids  CURRENT THERAPY:   -Herceptin and Perjeta maintenance therapy every 3 weeks. Held 03/2018-07/17/18 due to low EF.  -Monthly Zoladex injections starting 03/20/18  -Letrozole 2.'5mg'$  once daily starting 05/03/18, changed to anastrozole in 07/2018    INTERVAL HISTORY:  Sheila [name] is here for a follow up. She was seen by NP Lacie in the interim, and was noted to be still having right side pain and spasms. Her recent bone scan revealed no metastasis.  She has BOS surgery scheduled on 10/2018. Today, she  is here alone at the clinic. She states that her back pain improved. The pain returned after the Zoladex injection, lasted a few days, but now resolved. No cough, CP or SOB. She is currently taking Effexor for hot flashes and is tolerating well. She denies feeling dizzy.  She still finds it difficult to fall a sleep, and states that she wakes up at night frequently to urinate because she drink a lot before she sleeps.  She started taking Anastrozole a week ago. She states that [facility]'t give her 75 mg Effexor, so she had to use 150 mg in addition to her previous 150 mg dose.    MEDICAL HISTORY:  Past Medical History:  Diagnosis Date  . Anxiety   . Cancer [facility])    right breast   . Cardiomyopathy ([place])   . Family history of breast cancer   . Family history of colon cancer   . GERD (gastroesophageal reflux disease)   . Headache    couple of migraines in the past  . History of kidney stones   . Vitamin D deficiency    SURGICAL HISTORY: Past Surgical History:  Procedure Laterality Date  .  egg donation    . kidney stone removal    . MASTECTOMY W/ SENTINEL NODE BIOPSY Bilateral 07/22/2017   Procedure: RIGHT TOTAL MASTECTOMY WITH RIGHT AXILLARY SENTINEL LYMPH NODE BIOPSY, LEFT PROPHYLACTIC MASTECTOMY;  Surgeon: [name], MD;  Location: [place];  Service: General;  Laterality: Bilateral;  . PORTACATH PLACEMENT Right 07/22/2017   Procedure: INSERTION PORT-A-CATH WITH [name];  Surgeon: [name], MD;  Location: [place];  Service: General;  Laterality: Right;   SOCIAL HISTORY: Social History   Socioeconomic History  . Marital status: Single    Spouse name: Not on file  . Number of children: Not on file  . Years of education: Not on file  . Highest education level: Not on file  Occupational History  . Not on file  Social Needs  . Financial resource strain: Not on file  . Food insecurity:    Worry: Not on file    Inability: Not on file  . Transportation needs:     Medical: Not on file    Non-medical: Not on file  Tobacco Use  . Smoking status: Never Smoker  . Smokeless tobacco: Former Network engineer and Sexual Activity  . Alcohol use: Yes    Comment: once a year  . Drug use: No  . Sexual activity: Yes  Lifestyle  . Physical activity:    Days per week: Not on file    Minutes per session: Not on file  . Stress: Not on file  Relationships  . Social connections:    Talks on phone: Not on file    Gets together: Not on file    Attends religious service: Not on file    Active member of club or organization: Not on file    Attends meetings of clubs or organizations: Not on file    Relationship status: Not on file  . Intimate partner violence:    Fear of current or ex partner: Not on file    Emotionally abused: Not on file    Physically abused: Not on file    Forced sexual activity: Not on file  Other Topics Concern  . Not on file  Social History Narrative  . Not on file   FAMILY HISTORY: Family History  Problem Relation Age of Onset  . Breast cancer Paternal Grandmother 10  . Colon cancer Paternal Grandmother 44  . Breast cancer Mother 38  . Osteoporosis Mother   . Prostate cancer Paternal Grandfather        dx in his 65s  . Leukemia Paternal Grandfather   . Lung cancer Maternal Uncle        heavy smoker  . Other Paternal Uncle        farm accident  . Stroke Maternal Grandfather    ALLERGIES:  has No Known Allergies.  MEDICATIONS:  Current Outpatient Medications  Medication Sig Dispense Refill  . anastrozole (ARIMIDEX) 1 MG tablet Take 1 tablet (1 mg total) by mouth daily. 30 tablet 1  . carvedilol (COREG) 3.125 MG tablet TAKE 1 TABLET BY MOUTH 2 TIMES DAILY. 60 tablet 3  . Cholecalciferol (VITAMIN D) 2000 units tablet Take 2,000 Units by mouth daily.    . cyclobenzaprine (FLEXERIL) 5 MG tablet Take 1 tablet (5 mg total) by mouth 2 (two) times daily as needed for muscle spasms. 30 tablet 0  . diphenoxylate-atropine (LOMOTIL)  2.5-0.025 MG tablet Take 1-2 tablets by mouth 4 (four) times daily as needed for diarrhea or loose stools. 30 tablet 0  .  levothyroxine (SYNTHROID, LEVOTHROID) 25 MCG tablet Take 25 mcg by mouth daily before breakfast.    . losartan (COZAAR) 25 MG tablet TAKE 1/2 TABLET BY MOUTH AT BEDTIME. 15 tablet 3  . Melatonin 10 MG TABS Take 1 tablet by mouth.     . naproxen sodium (ANAPROX) 220 MG tablet Take 220 mg 2 (two) times daily as needed by mouth (pain).     . Omega-3 Fatty Acids (OMEGA-3 FISH OIL PO) Take 100 Units by mouth.    . venlafaxine XR (EFFEXOR-XR) 150 MG 24 hr capsule TAKE 1 CAPSULE (150 MG TOTAL) BY MOUTH DAILY WITH BREAKFAST. 30 capsule 3  . zolpidem (AMBIEN) 5 MG tablet Take 1 tablet (5 mg total) by mouth at bedtime as needed for sleep. 30 tablet 1   Current Facility-Administered Medications  Medication Dose Route Frequency Provider Last Rate Last Dose  . gi cocktail (Maalox,Lidocaine,Donnatal)  30 mL Oral Once Harle Stanford., PA-C       REVIEW OF SYSTEMS: Constitutional: Denies fevers, (+) hot flashes (+) insomnia  Eyes: Denies blurriness of vision, double vision or watery eyes Ears, nose, mouth, throat, and face: Denies mucositis or sore throat  Respiratory: Denies cough, dyspnea or wheezes Cardiovascular: Denies palpitation, chest discomfort or lower extremity swelling Gastrointestinal:  Denies nausea, heartburn or change in bowel habits   Skin: Denies abnormal skin rashes MSK: (+) Soreness of neck and back, improving Lymphatics: Denies new lymphadenopathy or easy bruising Neurological:Denies numbness, tingling or new weaknesses Behavioral/Psych: Mood is stable, no new changes  Breast: (+) bilateral mastectomy on 07/22/17 All other systems were reviewed with the patient and are negative.  PHYSICAL EXAMINATION:  ECOG PERFORMANCE STATUS: 2  Vitals:   08/27/18 1305  BP: (!) 88/67  Pulse: (!) 109  Resp: 16  Temp: 98.4 F (36.9 C)  SpO2: 99%   Filed Weights   08/27/18  1305  Weight: 159 lb 6.4 oz (72.3 kg)      GENERAL:alert, no distress and comfortable (+) hair growth  SKIN: skin color, texture, turgor are normal, no rashes or significant lesions EYES: normal, conjunctiva are pink and non-injected, sclera clear OROPHARYNX:no exudate, no erythema and lips, buccal mucosa, and tongue normal  NECK: supple, thyroid normal size, non-tender, without nodularity LYMPH:  no palpable lymphadenopathy in the cervical, axillary or inguinal LUNGS: clear to auscultation and percussion with normal breathing effort HEART: regular rate & rhythm and no murmurs and no lower extremity edema ABDOMEN:abdomen soft, non-tender and normal bowel sounds Musculoskeletal:no cyanosis of digits and no clubbing  PSYCH: alert & oriented x 3 with fluent speech NEURO: no focal motor/sensory deficits Breast: (+)bilateral mastectomy, incisions clean and intact, healed well. Diffuse hyperpigmentation in the right chest wall and axilla. No ulcers or discharge.  No palpable chest wall mass or adenopathy.  LABORATORY DATA:  I have reviewed the data as listed CBC Latest Ref Rng & Units 08/27/2018 08/06/2018 07/17/2018  WBC 4.0 - 10.5 K/uL 4.0 2.9(L) 2.8(L)  Hemoglobin 12.0 - 15.0 g/dL 11.9(L) 11.7 10.9(L)  Hematocrit 36.0 - 46.0 % 35.3(L) 34.6(L) 33.0(L)  Platelets 150 - 400 K/uL 170 180 170   CMP Latest Ref Rng & Units 08/27/2018 08/06/2018 07/17/2018  Glucose 70 - 99 mg/dL 87 143(H) 146(H)  BUN 6 - 20 mg/dL '11 15 13  '$ Creatinine 0.44 - 1.00 mg/dL 1.02(H) 1.03(H) 0.96  Sodium 135 - 145 mmol/L 140 139 140  Potassium 3.5 - 5.1 mmol/L 3.9 3.8 3.9  Chloride 98 - 111 mmol/L 105 107  106  CO2 22 - 32 mmol/L '26 24 25  '$ Calcium 8.9 - 10.3 mg/dL 9.6 9.4 9.2  Total Protein 6.5 - 8.1 g/dL 7.5 7.4 6.8  Total Bilirubin 0.3 - 1.2 mg/dL 0.4 0.4 0.3  Alkaline Phos 38 - 126 U/L 119 117 96  AST 15 - 41 U/L 13(L) 12(L) 11(L)  ALT 0 - 44 U/L '11 9 11   '$ PATHOLOGY  Diagnosis 07/22/17 1. Breast, simple  mastectomy, Left - FIBROADENOMA. - NO MALIGNANCY IDENTIFIED. 2. 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 - ADDITIONAL FOCUS OF LOBULAR CARCINOMA, GRADE 2, SPANNING 2.2 CM. - INTERMEDIATE GRADE DUCTAL CARCINOMA IN SITU. - INVASIVE CARCINOMA COMES TO WITHIN 0.2 TO 0.3 CM OF THE DEEP MARGIN, FOCALLY. - TUMOR FOCALLY INVOLVES EPIDERMIS. - LYMPHOVASCULAR INVASION OF DERMAL LYMPHATICS. - ONE OF ONE LYMPH NODES NEGATIVE FOR CARCINOMA (0/1). - SEE ONCOLOGY TABLE. 3. Lymph node, sentinel, biopsy, Right axillary - ONE LYMPH NODE WITH ISOLATED TUMOR CELLS (0/1). 4. Lymph node, sentinel, biopsy, Right - METASTATIC CARCINOMA IN ONE OF ONE LYMPH NODES (1/1). 5. Lymph node, sentinel, biopsy, Right - ONE OF ONE LYMPH NODES NEGATIVE FOR CARCINOMA (0/1). 6. Lymph node, sentinel, biopsy, Right - METASTATIC CARCINOMA IN ONE OF ONE LYMPH NODES (1/1). - EXTRACAPSULAR EXTENSION. 7. Lymph node, sentinel, biopsy, Right - METASTATIC CARCINOMA IN ONE OF ONE LYMPH NODES (1/1). - EXTRACAPSULAR EXTENSION. 8. Lymph node, sentinel, biopsy, Right - ONE OF ONE LYMPH NODES NEGATIVE FOR CARCINOMA (0/1). 9. Lymph node, sentinel, biopsy, Right - ONE OF ONE LYMPH NODES NEGATIVE FOR CARCINOMA (0/1). 10. Lymph node, sentinel, biopsy, Right - METASTATIC CARCINOMA IN ONE OF ONE LYMPH NODES (1/1). - EXTRACAPSULAR EXTENSION. 11. Lymph node, biopsy, Right axillary - ONE OF ONE LYMPH NODES NEGATIVE FOR CARCINOMA (0/1). Microscopic Comment 2. BREAST, INVASIVE TUMOR Microscopic Comment(continued) Procedure: Bilateral simple mastectomies with right axillary sentinel lymph node biopsies. Laterality: Right. Tumor Size: 5.3 cm. Histologic Type: Mixed lobular and ductal carcinoma. Grade: 2 Tubular Differentiation: 2 Nuclear Pleomorphism: 3 Mitotic Count: 1 Ductal Carcinoma in Situ (DCIS): Present, intermediate grade. Extent of Tumor: Involves epidermis of  nipple focally. Margins: Invasive carcinoma, distance from closest margin: 0.2-0.3 cm of posterior margin (smaller mass). DCIS, distance from closest margin: >0.5 cm all margins Regional Lymph Nodes: Number of Lymph Nodes Examined: 10 Number of Sentinel Lymph Nodes Examined: 9 Lymph Nodes with Macrometastases: 4 Lymph Nodes with Micrometastases: 0 Lymph Nodes with Isolated Tumor Cells: 1 Breast Prognostic Profile: Performed on biopsy ([id number]), see below. Will be performed on additional lobular focus. Estrogen Receptor: Positive, 100% strong staining. Progesterone Receptor: Positive, 100% strong staining. Her2: Positive (ratio 2.55). Ki-67: 12%. Best tumor block for sendout testing: 2B (larger focus), 2E (smaller focus). Pathologic Stage Classification (pTNM, AJCC 8th Edition): Primary Tumor (pT): mpT3 Regional Lymph Nodes (pN): pN2a Distant Metastases (pM): pMX  2. FLUORESCENCE IN-SITU HYBRIDIZATION Results: HER2 - NEGATIVE RATIO OF HER2/CEP17 SIGNALS 1.71 AVERAGE HER2 COPY NUMBER PER CELL 1.80 2. PROGNOSTIC INDICATORS Results: IMMUNOHISTOCHEMICAL AND MORPHOMETRIC ANALYSIS PERFORMED MANUALLY Estrogen Receptor: 95%, POSITIVE, STRONG STAINING INTENSITY Progesterone Receptor: 95%, POSITIVE, STRONG STAINING INTENSITY Proliferation Marker Ki67: 10%  4. FLUORESCENCE IN-SITU HYBRIDIZATION Results: HER2 - **POSITIVE**. OF NOTE, A TOTAL OF 40 TUMOR CELLS WERE EVALUATED FOR HER2 EXPRESSION. RATIO OF HER2/CEP17 SIGNALS 2.01 AVERAGE HER2 COPY NUMBER PER CELL 3.63  Estrogen Receptor: 95%, POSITIVE, STRONG STAINING INTENSITY Progesterone Receptor: 95%, POSITIVE, STRONG STAINING INTENSITY  Addendum 1. By immunohistochemistry, the tumor  cells in part 2 are Negative for Her2 (1+).  2. The current prognostic profile was performed on the additional focus of pure lobular carcinoma.  Diagnosis 06/11/17 Breast, right, needle core biopsy, 11:00 o'clock - INVASIVE DUCTAL CARCINOMA, SEE  COMMENT. Microscopic Comment The carcinoma appears grade 2. Prognostic markers will be ordered. Dr. Lyndon Code has reviewed the case. The case was called to [facility] on 06/12/2017. Results: HER2 - **POSITIVE** RATIO OF HER2/CEP17 SIGNALS 2.55 AVERAGE HER2 COPY NUMBER PER CELL 4.85    PROCEDURES  ECHO 07/13/18  Impressions: - Normal LV size with EF 55-60%. Strain as above. Normal RV size   and systolic function. No significant valvular abnormalities.  ECHO expected 08/12/17  RADIOGRAPHIC STUDIES: I have personally reviewed the radiological images as listed and agreed with the findings in the report. Ct Chest W Contrast  Addendum Date: 07/31/2018   ADDENDUM REPORT: 07/31/2018 17:32 ADDENDUM: Tiny stable sclerotic focus in the left femoral head, nonspecific. Electronically Signed   By: Fidela Salisbury M.D.   On: 07/31/2018 17:32   Result Date: 07/31/2018 CLINICAL DATA:  Breast neoplasm, staging. EXAM: CT CHEST, ABDOMEN, AND PELVIS WITH CONTRAST TECHNIQUE: Multidetector CT imaging of the chest, abdomen and pelvis was performed following the standard protocol during bolus administration of intravenous contrast. CONTRAST:  113m OMNIPAQUE IOHEXOL 300 MG/ML SOLN, 370mISOVUE-300 IOPAMIDOL (ISOVUE-300) INJECTION 61% COMPARISON:  CT of the abdomen and pelvis 12/29/2017 FINDINGS: CT CHEST FINDINGS Cardiovascular: No significant vascular findings. Normal heart size. No pericardial effusion. Mediastinum/Nodes: No definite mediastinal lymphadenopathy. Calcified right hilar lymphadenopathy. Normal appearance of the thyroid, trachea and esophagus. Soft tissue thickening within the anterior mediastinum measures 3.6 by 1.4 cm. Lungs/Pleura: Linear and nodular peribronchovascular opacities in the right middle lobe with interspersed calcifications. Perifissural ground-glass opacities in the right middle lobe measuring 3.0 cm, image 60/140, sequence 6. 4 mm subpleural nodule in the anterior  portion of the right upper lobe. 6 mm ground-glass nodule in the superior segment of the right lower lobe. Perifissural 6 mm ground-glass nodule in the superior segment of the right upper lobe. Musculoskeletal: No chest wall mass or suspicious bone lesions identified. Injectable port terminates in the superior vena cava. Post bilateral mastectomies and right axillary lymph node dissection. No axillary lymphadenopathy. CT ABDOMEN PELVIS FINDINGS Hepatobiliary: Tiny too small to be actually characterized hypoattenuated focus in the right lobe of the liver. The gallbladder is normal. Pancreas: Unremarkable. No pancreatic ductal dilatation or surrounding inflammatory changes. Spleen: Normal in size without focal abnormality. Adrenals/Urinary Tract: Adrenal glands are unremarkable. Kidneys are normal, without renal calculi, focal lesion, or hydronephrosis. Bladder is unremarkable. Stomach/Bowel: Stomach is within normal limits. Appendix appears normal. No evidence of bowel wall thickening, distention, or inflammatory changes. Vascular/Lymphatic: No significant vascular findings are present. No enlarged abdominal or pelvic lymph nodes. Reproductive: Uterus and bilateral adnexa are unremarkable. 1.8 cm left ovarian cyst noted, normal finding in a premenopausal female. Other: No abdominal wall hernia or abnormality. No abdominopelvic ascites. Musculoskeletal: No acute or significant osseous findings. L5-S1 spondylosis with discogenic sclerotic changes of the endplates. IMPRESSION: Residual linear and nodular peribronchovascular opacities in the right middle lobe, with other scattered areas of ground-glass opacities in the right middle, right upper and right lower lobes, which are located within the previously demonstrated bulky airspace consolidation. The findings are improved from the chest CT dated 12/30/2017, and relatively stable from PET-CT dated 02/04/2018. These may represent post infectious/post inflammatory changes  or residual malignancy. Stable partially calcified right  hilar lymphadenopathy. Interval development of nodular soft tissue thickening within the anterior mediastinum in the area of the thymus, new from chest CT dated 12/30/2017. This may represent reactive thymic tissue, thymoma or lymphadenopathy. 1.8 cm probably physiologic left ovarian cyst. Borderline thickening of the endometrium measuring 15.6 mm. Further evaluation with pelvic ultrasound may be considered. No new or suspicious findings within the abdomen. Electronically Signed: By: Fidela Salisbury M.D. On: 07/31/2018 17:29   Nm Bone Scan Whole Body  Result Date: 07/31/2018 CLINICAL DATA:  Breast cancer.  Mid to lower back pain. EXAM: NUCLEAR MEDICINE WHOLE BODY BONE SCAN TECHNIQUE: Whole body anterior and posterior images were obtained approximately 3 hours after intravenous injection of radiopharmaceutical. RADIOPHARMACEUTICALS:  20.3 mCi Technetium-20mMDP IV COMPARISON:  Bone scan 07/02/2017 FINDINGS: Physiologic distribution of radiotracer. No evidence of abnormal radiotracer uptake within the axial or appendicular skeleton. Left antecubital fossa activity likely related to injection site. IMPRESSION: No evidence of skeletal metastatic disease. Electronically Signed   By: DFidela SalisburyM.D.   On: 07/31/2018 17:35   Ct Abdomen Pelvis W Contrast  Addendum Date: 07/31/2018   ADDENDUM REPORT: 07/31/2018 17:32 ADDENDUM: Tiny stable sclerotic focus in the left femoral head, nonspecific. Electronically Signed   By: DFidela SalisburyM.D.   On: 07/31/2018 17:32   Result Date: 07/31/2018 CLINICAL DATA:  Breast neoplasm, staging. EXAM: CT CHEST, ABDOMEN, AND PELVIS WITH CONTRAST TECHNIQUE: Multidetector CT imaging of the chest, abdomen and pelvis was performed following the standard protocol during bolus administration of intravenous contrast. CONTRAST:  1038mOMNIPAQUE IOHEXOL 300 MG/ML SOLN, 3072mSOVUE-300 IOPAMIDOL (ISOVUE-300) INJECTION  61% COMPARISON:  CT of the abdomen and pelvis 12/29/2017 FINDINGS: CT CHEST FINDINGS Cardiovascular: No significant vascular findings. Normal heart size. No pericardial effusion. Mediastinum/Nodes: No definite mediastinal lymphadenopathy. Calcified right hilar lymphadenopathy. Normal appearance of the thyroid, trachea and esophagus. Soft tissue thickening within the anterior mediastinum measures 3.6 by 1.4 cm. Lungs/Pleura: Linear and nodular peribronchovascular opacities in the right middle lobe with interspersed calcifications. Perifissural ground-glass opacities in the right middle lobe measuring 3.0 cm, image 60/140, sequence 6. 4 mm subpleural nodule in the anterior portion of the right upper lobe. 6 mm ground-glass nodule in the superior segment of the right lower lobe. Perifissural 6 mm ground-glass nodule in the superior segment of the right upper lobe. Musculoskeletal: No chest wall mass or suspicious bone lesions identified. Injectable port terminates in the superior vena cava. Post bilateral mastectomies and right axillary lymph node dissection. No axillary lymphadenopathy. CT ABDOMEN PELVIS FINDINGS Hepatobiliary: Tiny too small to be actually characterized hypoattenuated focus in the right lobe of the liver. The gallbladder is normal. Pancreas: Unremarkable. No pancreatic ductal dilatation or surrounding inflammatory changes. Spleen: Normal in size without focal abnormality. Adrenals/Urinary Tract: Adrenal glands are unremarkable. Kidneys are normal, without renal calculi, focal lesion, or hydronephrosis. Bladder is unremarkable. Stomach/Bowel: Stomach is within normal limits. Appendix appears normal. No evidence of bowel wall thickening, distention, or inflammatory changes. Vascular/Lymphatic: No significant vascular findings are present. No enlarged abdominal or pelvic lymph nodes. Reproductive: Uterus and bilateral adnexa are unremarkable. 1.8 cm left ovarian cyst noted, normal finding in a  premenopausal female. Other: No abdominal wall hernia or abnormality. No abdominopelvic ascites. Musculoskeletal: No acute or significant osseous findings. L5-S1 spondylosis with discogenic sclerotic changes of the endplates. IMPRESSION: Residual linear and nodular peribronchovascular opacities in the right middle lobe, with other scattered areas of ground-glass opacities in the right middle, right upper and right lower lobes, which  are located within the previously demonstrated bulky airspace consolidation. The findings are improved from the chest CT dated 12/30/2017, and relatively stable from PET-CT dated 02/04/2018. These may represent post infectious/post inflammatory changes or residual malignancy. Stable partially calcified right hilar lymphadenopathy. Interval development of nodular soft tissue thickening within the anterior mediastinum in the area of the thymus, new from chest CT dated 12/30/2017. This may represent reactive thymic tissue, thymoma or lymphadenopathy. 1.8 cm probably physiologic left ovarian cyst. Borderline thickening of the endometrium measuring 15.6 mm. Further evaluation with pelvic ultrasound may be considered. No new or suspicious findings within the abdomen. Electronically Signed: By: Fidela Salisbury M.D. On: 07/31/2018 17:29   US Pelvic Complete With Transvaginal  Result Date: 08/14/2018 CLINICAL DATA:  Follow-up left ovarian cyst and endometrium from CT EXAM: TRANSABDOMINAL AND TRANSVAGINAL ULTRASOUND OF PELVIS TECHNIQUE: Both transabdominal and transvaginal ultrasound examinations of the pelvis were performed. Transabdominal technique was performed for global imaging of the pelvis including uterus, ovaries, adnexal regions, and pelvic cul-de-sac. It was necessary to proceed with endovaginal exam following the transabdominal exam to visualize the ovaries. COMPARISON:  CT 07/31/2018 FINDINGS: Uterus Measurements: 6.8 x 3.4 x 3.6 cm. No fibroids or other mass visualized.  Endometrium Thickness: 8 mm in thickness.  No focal abnormality visualized. Right ovary Measurements: 3.4 x 1.7 x 2.1 cm. Normal appearance. Small follicle measures up to 1 cm Left ovary Measurements: 4.3 x 2.1 x 2.6 cm. Normal appearance. Small follicle measures up to 2 cm. Other findings No abnormal free fluid. IMPRESSION: Normal pelvic ultrasound. Electronically Signed   By: Rolm Baptise M.D.   On: 08/14/2018 09:20     07/31/2018 Bone Scan IMPRESSION: No evidence of skeletal metastatic disease.   07/31/2018 CT CAP IMPRESSION: Residual linear and nodular peribronchovascular opacities in the right middle lobe, with other scattered areas of ground-glass opacities in the right middle, right upper and right lower lobes, which are located within the previously demonstrated bulky airspace consolidation. The findings are improved from the chest CT dated 12/30/2017, and relatively stable from PET-CT dated 02/04/2018. These may represent post infectious/post inflammatory changes or residual malignancy.  Stable partially calcified right hilar lymphadenopathy.  Interval development of nodular soft tissue thickening within the anterior mediastinum in the area of the thymus, new from chest CT dated 12/30/2017. This may represent reactive thymic tissue, thymoma or lymphadenopathy.  1.8 cm probably physiologic left ovarian cyst. Borderline thickening of the endometrium measuring 15.6 mm. Further evaluation with pelvic ultrasound may be considered.  No new or suspicious findings within the abdomen.  04/06/2018 CT Chest IMPRESSION: 1. Stable linear nodular thickening in the RIGHT middle lobe is favored post infectious or inflammatory scarring. 2. Calcified RIGHT hilar lymph node is consistent post infectious process. 3. Stable small LEFT upper lobe nodule. 4. Stable sclerotic lesion in the manubrium. Sclerotic lesion at T3 is more prominent (4 mm). Sclerotic lesion at L 1 is new  from 07/02/2017.  PET Scan 02/04/18  IMPRESSION: 1. The previous consolidation in the right middle lobe currently has a more nodular and reticular appearance with some associated mild atelectasis along its anterior margin. This demonstrates low-grade metabolic activity with maximum SUV 3.2 (background blood pool activity is 2.7). Although conceivably related to low-grade residuum from the patient's malignancy, the presence of calcified right hilar lymph nodes and small calcifications along the nodularity raise suspicion for a low-grade active granulomatous process or atypical infectious process. Surveillance is recommended. 2. Scattered deposits of hypermetabolic but benign metabolically active  brown fat.  CT AP W Contrast 12/29/17 IMPRESSION: No definite acute intra-abdominal or intrapelvic abnormalities. 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  CT Chest W Contrast 12/28/17 IMPRESSION: 1. 2.3 x 2.7 x 1.2 cm irregular masslike opacity within the right middle lobe worrisome for malignancy/metastasis. 2. 3 new very small sclerotic lesions, within the sternum and the body of T4, worrisome for new metastases.  CT AP W Contrast 09/05/17 IMPRESSION: Stable 7 mm hyperdensity seen in right hepatic lobe most consistent with hemangioma based on recent MRI. 2.4 cm right ovarian involuting cyst is noted. No other abnormality seen in the abdomen or pelvis.  MRI Abdomen at [facility] 08/25/17  IMPRESSION: 1. Lesion in segment 7 of the liver has signal and enhancement characteristics compatible with a flash filling hemangioma. No specific findings identified to suggest metastatic disease.  ASSESSMENT & PLAN:  Sheila [name] is a 42 y.o. premenopausal female with a history of Vitamin D deficiency and high cholesterol and kidney stones, presented with a palpable right breast mass.   1. Malignant neoplasm of upper-outer quadrant of right breast in female,  mixed invasive ductal and lobular carcinoma, pT3(m)N2aM0, stage 1b, ductal carcinoma triple positive, lobular carcinoma ER+/PR+/HER2-,  Grade 2 -She underwent staging CT and bone scan on 07/02/2017 prior to surgery, both were negative for metastasis. -There was a 47m enhancing or hyperdense focus posteriorly in the liver which is technically nonspecific, and her abdominal MRI showed this lesion is likely hemangioma. -I previously reviewed the surgical pathology with the patient and her husband in detail.  -Final diagnosis from the right simple mastectomy and sentinel lymph node biopsy on 07/22/2017 revealed mixed invasive lobular and ductal carcinoma, grade 2, spanning 5.3 cm. 75% are ductal histology. There is an additional focus of lobular carcinoma, grade 2, spanning 2.2cm and it is ER+/PR+/HER2-. 4 of 10 lymph nodes tested positive for macrometastases and they are HER2- ductal carcinoma except one was mixed ductal and lobular. I have discussed her final surgical path wit pathologist Dr. MTresa Moorein details.   -Her initial biopsy was HER2+ by FISH (ratio 2.55 and copy number 4.85) in her ductal carcinoma. Her lobular were HER2 negative.   -She completed adjuvant chemo with TCHP.  We will continue maintenance Herceptin and pejeta to complete 1 year of therapy.  -She initially preferred reconstruction. She saw Dr. TIran Planasbut ultimately did not opt for reconstruction.  -Given her ER/PR positive tumor and her young age, I recommend ovarian suppression and adjuvant antiestrogen therapy with AI. She has started Zoladex on 03/20/2018, tolerating well  --Patient completed radiation on 04/09/2018  -due to decreased EF, Herceptin and pejeta was held in 03/2018, restarted in August 2019 when EF improved to 55 to 60%. -She started adjuvant Letrozole on 05/03/18.  She is on Zoladex injection for ovarian suppression, currently well except a hot flash. -due to her worsening back pain, letrozole was changed to  anastrozole -Repeated bone scan in September 2019 was negative, CT scan showed residual lesion in the right middle lobe, with other scattered areas of groundglass changes, improved, fever to be inflammatory.  No other evidence of recurrence. -Labs reviewed, WBC at 4, Hg at 11.9. I will monitor her counts. CMP showed Cr 1.02. CA 27.29 pending.  Adequate for treatment, will proceed Herceptin and pejeta infusion today, and continue every 3 weeks.  She has a repeated echo and appointment with Dr. MAlgernon Huxleyscheduled before next treatment. -Continue with Zoladex injection monthly, she has  appointment with GYN to discuss BSO in December. -F/u in 3 week   2. Genetics -Due to her family history of breast cancer, she agreed to a genetics referral -She was negative for all mutations tested   3. Insomnia  -She has history of insomnia failed multiple agents such as lunesta, trazodone, tylenol pm, and xanax and only had mild relief with Remeron. -She reported that Ativan has alleviated her symptoms thus far and she voiced needing a refill. Refill provided previously  -We previously discussed potentially weaning her off Ativan for sleep, she has mild anxiety due to treatment. She previously was able to ween off Ativan and started Ambien.  -Patient notes that her sleep has improved with Ambien.  -She was weaned off Ambien is now on high dose melatonin.  -She currently takes Melatonin to help her sleep, is not very effective.  She still has significant insomnia, only sleeps a few hours at night.  He previously tried multiple medications, and Ambien works the best for her. I refilled for her today.  4. Hot flash  -Her hot flashes are moderate-severe, multiple times a day. She states she was previously on Effexor '75mg'$  for depression and had to discontinue due to the Remeron. She is off Remeron right not.  -She restarted '75mg'$  Effexor, prescribed 02/06/18 -much improved on Effexor   -She is now taking 2 tablets of  75 mg Effexor daily with improvement of her hot flashes. I previously prescribed 150 mg Effexor on 04/10/2018.  -Her hot flashes have worsened lately. I increased her Effexor to '225mg'$  by prescribing '75mg'$  Effexor previously to add to her '150mg'$    5. Bone Health  -I previously briefly discussed previously how an aromatase inhibitor can weaken the bone -She currently taking Vitamin D supplement  -At this time, there is not a need for Vitamin C supplements, however, I previously discussed with the patient to consume vitamin C rich foods to aid with this.  -We will order a bone density scan in 2020  6. Diffuse Back and neck pain/soreness  -Her CT Chest on 04/06/18 showed sclerotic lesions of spine in T3 and L1.  -her bone scan in 07/2018 was negative  -Letrozole was changed to anastrozole in September 2019  PLAN:  -Lab reviewed, adequate for treatment, will proceed Herceptin and pejeta today  -She has a echo and follow-up with cardiology before next treatment  -I will call in Effexor 75 mg to [place], F/u and infusion in 3 weeks -Injection in 2 weeks  -Refilled Ambien today, she will take 2.'5mg'$  at night    All questions were answered. The patient knows to call the clinic with any problems, questions or concerns.  I spent 25 minutes counseling the patient face to face. The total time spent in the appointment was 30 minutes and more than 50% was on counseling.  Dierdre Searles Dweik am acting as scribe for Dr. [name].  I have reviewed the above documentation for accuracy and completeness, and I agree with the above.    [name]  08/27/2018

## 2018-08-27 ENCOUNTER — Inpatient Hospital Stay: Payer: BLUE CROSS/BLUE SHIELD | Attending: Nurse Practitioner

## 2018-08-27 ENCOUNTER — Inpatient Hospital Stay: Payer: BLUE CROSS/BLUE SHIELD

## 2018-08-27 ENCOUNTER — Encounter: Payer: Self-pay | Admitting: Hematology

## 2018-08-27 ENCOUNTER — Inpatient Hospital Stay (HOSPITAL_BASED_OUTPATIENT_CLINIC_OR_DEPARTMENT_OTHER): Payer: BLUE CROSS/BLUE SHIELD | Admitting: Hematology

## 2018-08-27 ENCOUNTER — Encounter: Payer: Self-pay | Admitting: General Practice

## 2018-08-27 ENCOUNTER — Telehealth: Payer: Self-pay | Admitting: Hematology

## 2018-08-27 VITALS — BP 88/66

## 2018-08-27 VITALS — BP 88/67 | HR 109 | Temp 98.4°F | Resp 16 | Ht 66.0 in | Wt 159.4 lb

## 2018-08-27 DIAGNOSIS — Z87442 Personal history of urinary calculi: Secondary | ICD-10-CM | POA: Insufficient documentation

## 2018-08-27 DIAGNOSIS — E559 Vitamin D deficiency, unspecified: Secondary | ICD-10-CM

## 2018-08-27 DIAGNOSIS — Z79811 Long term (current) use of aromatase inhibitors: Secondary | ICD-10-CM | POA: Insufficient documentation

## 2018-08-27 DIAGNOSIS — Z793 Long term (current) use of hormonal contraceptives: Secondary | ICD-10-CM | POA: Diagnosis not present

## 2018-08-27 DIAGNOSIS — R12 Heartburn: Secondary | ICD-10-CM | POA: Diagnosis not present

## 2018-08-27 DIAGNOSIS — F419 Anxiety disorder, unspecified: Secondary | ICD-10-CM | POA: Insufficient documentation

## 2018-08-27 DIAGNOSIS — Z9013 Acquired absence of bilateral breasts and nipples: Secondary | ICD-10-CM | POA: Insufficient documentation

## 2018-08-27 DIAGNOSIS — C773 Secondary and unspecified malignant neoplasm of axilla and upper limb lymph nodes: Secondary | ICD-10-CM | POA: Insufficient documentation

## 2018-08-27 DIAGNOSIS — R232 Flushing: Secondary | ICD-10-CM

## 2018-08-27 DIAGNOSIS — E7800 Pure hypercholesterolemia, unspecified: Secondary | ICD-10-CM | POA: Insufficient documentation

## 2018-08-27 DIAGNOSIS — Z170 Estrogen receptor positive status [ER+]: Secondary | ICD-10-CM | POA: Insufficient documentation

## 2018-08-27 DIAGNOSIS — Z803 Family history of malignant neoplasm of breast: Secondary | ICD-10-CM | POA: Insufficient documentation

## 2018-08-27 DIAGNOSIS — Z79899 Other long term (current) drug therapy: Secondary | ICD-10-CM | POA: Diagnosis not present

## 2018-08-27 DIAGNOSIS — G4700 Insomnia, unspecified: Secondary | ICD-10-CM | POA: Insufficient documentation

## 2018-08-27 DIAGNOSIS — Z5112 Encounter for antineoplastic immunotherapy: Secondary | ICD-10-CM | POA: Diagnosis not present

## 2018-08-27 DIAGNOSIS — C50411 Malignant neoplasm of upper-outer quadrant of right female breast: Secondary | ICD-10-CM | POA: Diagnosis not present

## 2018-08-27 DIAGNOSIS — Z95828 Presence of other vascular implants and grafts: Secondary | ICD-10-CM

## 2018-08-27 LAB — COMPREHENSIVE METABOLIC PANEL
ALBUMIN: 3.9 g/dL (ref 3.5–5.0)
ALK PHOS: 119 U/L (ref 38–126)
ALT: 11 U/L (ref 0–44)
AST: 13 U/L — AB (ref 15–41)
Anion gap: 9 (ref 5–15)
BUN: 11 mg/dL (ref 6–20)
CO2: 26 mmol/L (ref 22–32)
Calcium: 9.6 mg/dL (ref 8.9–10.3)
Chloride: 105 mmol/L (ref 98–111)
Creatinine, Ser: 1.02 mg/dL — ABNORMAL HIGH (ref 0.44–1.00)
GFR calc non Af Amer: >60 mL/min (ref >60)
Glucose, Bld: 87 mg/dL (ref 70–99)
Potassium: 3.9 mmol/L (ref 3.5–5.1)
SODIUM: 140 mmol/L (ref 135–145)
TOTAL PROTEIN: 7.5 g/dL (ref 6.5–8.1)
Total Bilirubin: 0.4 mg/dL (ref 0.3–1.2)

## 2018-08-27 LAB — CBC WITH DIFFERENTIAL/PLATELET
ABS IMMATURE GRANULOCYTES: 0.01 10*3/uL (ref 0.00–0.07)
BASOS PCT: 1 %
Basophils Absolute: 0 10*3/uL (ref 0.0–0.1)
EOS ABS: 0.2 10*3/uL (ref 0.0–0.5)
Eosinophils Relative: 5 %
HCT: 35.3 % — ABNORMAL LOW (ref 36.0–46.0)
Hemoglobin: 11.9 g/dL — ABNORMAL LOW (ref 12.0–15.0)
Immature Granulocytes: 0 %
Lymphocytes Relative: 32 %
Lymphs Abs: 1.3 10*3/uL (ref 0.7–4.0)
MCH: 30.4 pg (ref 26.0–34.0)
MCHC: 33.7 g/dL (ref 30.0–36.0)
MCV: 90.3 fL (ref 80.0–100.0)
MONO ABS: 0.3 10*3/uL (ref 0.1–1.0)
Monocytes Relative: 9 %
NEUTROS ABS: 2.1 10*3/uL (ref 1.7–7.7)
NEUTROS PCT: 53 %
PLATELETS: 170 10*3/uL (ref 150–400)
RBC: 3.91 MIL/uL (ref 3.87–5.11)
RDW: 12.4 % (ref 11.5–15.5)
WBC: 4 10*3/uL (ref 4.0–10.5)
nRBC: 0 % (ref 0.0–0.2)

## 2018-08-27 MED ORDER — ZOLPIDEM TARTRATE 5 MG PO TABS: 5.0000 mg | ORAL_TABLET | Freq: Every evening | ORAL | 1 refills | Status: DC | PRN

## 2018-08-27 MED ORDER — SODIUM CHLORIDE 0.9% FLUSH
10.0000 mL | INTRAVENOUS | Status: DC | PRN
  Filled 2018-08-27: qty 10

## 2018-08-27 MED ORDER — TRASTUZUMAB CHEMO 150 MG IV SOLR
450.0000 mg | Freq: Once | INTRAVENOUS | Status: AC
  Administered 2018-08-27: 450 mg via INTRAVENOUS
  Filled 2018-08-27: qty 21.4

## 2018-08-27 MED ORDER — SODIUM CHLORIDE 0.9 % IV SOLN
420.0000 mg | Freq: Once | INTRAVENOUS | Status: AC
  Administered 2018-08-27: 420 mg via INTRAVENOUS
  Filled 2018-08-27: qty 14

## 2018-08-27 MED ORDER — DIPHENHYDRAMINE HCL 25 MG PO CAPS: 50.0000 mg | ORAL_CAPSULE | Freq: Once | ORAL | Status: DC

## 2018-08-27 MED ORDER — ACETAMINOPHEN 325 MG PO TABS
650.0000 mg | ORAL_TABLET | Freq: Once | ORAL | Status: AC
  Administered 2018-08-27: 650 mg via ORAL

## 2018-08-27 MED ORDER — HEPARIN SOD (PORK) LOCK FLUSH 100 UNIT/ML IV SOLN
500.0000 [IU] | Freq: Once | INTRAVENOUS | Status: DC | PRN
  Filled 2018-08-27: qty 5

## 2018-08-27 MED ORDER — SODIUM CHLORIDE 0.9% FLUSH
10.0000 mL | Freq: Once | INTRAVENOUS | Status: AC
  Administered 2018-08-27: 10 mL
  Filled 2018-08-27: qty 10

## 2018-08-27 MED ORDER — ACETAMINOPHEN 325 MG PO TABS
ORAL_TABLET | ORAL | Status: AC
  Filled 2018-08-27: qty 2

## 2018-08-27 MED ORDER — SODIUM CHLORIDE 0.9 % IV SOLN
Freq: Once | INTRAVENOUS | Status: AC
  Administered 2018-08-27: 14:00:00 via INTRAVENOUS
  Filled 2018-08-27: qty 250

## 2018-08-27 NOTE — Patient Instructions (Signed)
[facility] Discharge Instructions for Patients Receiving Chemotherapy  Today you received the following chemotherapy agents :  Herceptin, Perjeta.  To help prevent nausea and vomiting after your treatment, we encourage you to take your nausea medication as prescribed.   If you develop nausea and vomiting that is not controlled by your nausea medication, call the clinic.   BELOW ARE SYMPTOMS THAT SHOULD BE REPORTED IMMEDIATELY:  *FEVER GREATER THAN 100.5 F  *CHILLS WITH OR WITHOUT FEVER  NAUSEA AND VOMITING THAT IS NOT CONTROLLED WITH YOUR NAUSEA MEDICATION  *UNUSUAL SHORTNESS OF BREATH  *UNUSUAL BRUISING OR BLEEDING  TENDERNESS IN MOUTH AND THROAT WITH OR WITHOUT PRESENCE OF ULCERS  *URINARY PROBLEMS  *BOWEL PROBLEMS  UNUSUAL RASH Items with * indicate a potential emergency and should be followed up as soon as possible.  Feel free to call the clinic should you have any questions or concerns. The clinic phone number is [phone].  Please show the CHEMO ALERT CARD at check-in to the Emergency Department and triage nurse.   

## 2018-08-27 NOTE — Progress Notes (Signed)
OK to treat with low BP per MD Burr Medico, adding extra 500 ml NS during treatment today.

## 2018-08-27 NOTE — Progress Notes (Signed)
[facility]  Clinical Social Work was referred by Therapist, sports  to review and complete healthcare advance directives.  Clinical Social Worker met with patient in infusion room.  The patient designated Andee Poles as their primary healthcare agent and Raechel Ache as their secondary agent.  Patient also completed healthcare living will.    Clinical Social Worker notarized documents and made copies for patient/family. Clinical Social Worker will send documents to medical records to be scanned into patient's chart.  Clinical Social Worker encouraged patient/family to contact with any additional questions or concerns.  Edwyna Shell, LCSW Clinical Social Worker Phone:  [phone]

## 2018-08-27 NOTE — Telephone Encounter (Signed)
Appts scheduled AVS/calendar declined due to my chart per 10/10 los

## 2018-08-28 ENCOUNTER — Telehealth: Payer: Self-pay | Admitting: *Deleted

## 2018-08-28 ENCOUNTER — Telehealth: Payer: Self-pay | Admitting: Hematology

## 2018-08-28 LAB — CANCER ANTIGEN 27.29: CA 27.29: 9.4 U/mL (ref 0.0–38.6)

## 2018-08-28 NOTE — Telephone Encounter (Signed)
Printed medical records for patient request, Release ID: [id number]

## 2018-08-28 NOTE — Telephone Encounter (Signed)
On 08-28-18 call pt told her medical records are ready to be pick up, they are at [facility]

## 2018-09-08 ENCOUNTER — Telehealth: Payer: Self-pay | Admitting: Hematology

## 2018-09-08 NOTE — Telephone Encounter (Signed)
Faxed medical records to [facility] [phone], Release ID: [id number]

## 2018-09-10 ENCOUNTER — Inpatient Hospital Stay: Payer: BLUE CROSS/BLUE SHIELD

## 2018-09-10 ENCOUNTER — Encounter: Payer: Self-pay | Admitting: Oncology

## 2018-09-10 DIAGNOSIS — Z95828 Presence of other vascular implants and grafts: Secondary | ICD-10-CM

## 2018-09-10 DIAGNOSIS — C773 Secondary and unspecified malignant neoplasm of axilla and upper limb lymph nodes: Secondary | ICD-10-CM | POA: Diagnosis not present

## 2018-09-10 DIAGNOSIS — Z170 Estrogen receptor positive status [ER+]: Secondary | ICD-10-CM

## 2018-09-10 DIAGNOSIS — Z79899 Other long term (current) drug therapy: Secondary | ICD-10-CM | POA: Diagnosis not present

## 2018-09-10 DIAGNOSIS — Z87442 Personal history of urinary calculi: Secondary | ICD-10-CM | POA: Diagnosis not present

## 2018-09-10 DIAGNOSIS — C50411 Malignant neoplasm of upper-outer quadrant of right female breast: Secondary | ICD-10-CM

## 2018-09-10 DIAGNOSIS — Z793 Long term (current) use of hormonal contraceptives: Secondary | ICD-10-CM | POA: Diagnosis not present

## 2018-09-10 MED ORDER — GOSERELIN ACETATE 3.6 MG ~~LOC~~ IMPL
3.6000 mg | DRUG_IMPLANT | Freq: Once | SUBCUTANEOUS | Status: AC
  Administered 2018-09-10: 3.6 mg via SUBCUTANEOUS

## 2018-09-10 MED ORDER — GOSERELIN ACETATE 3.6 MG ~~LOC~~ IMPL
DRUG_IMPLANT | SUBCUTANEOUS | Status: AC
  Filled 2018-09-10: qty 3.6

## 2018-09-10 NOTE — Progress Notes (Signed)
Cardiac clearance form faxed to Dr. Claris Gladden office at [phone].

## 2018-09-14 ENCOUNTER — Telehealth: Payer: Self-pay | Admitting: Hematology

## 2018-09-14 NOTE — Telephone Encounter (Signed)
R/s appt per 10/28 sch message. - pt is aware of new appt date and time.

## 2018-09-15 ENCOUNTER — Encounter (HOSPITAL_COMMUNITY): Payer: Self-pay | Admitting: Cardiology

## 2018-09-15 ENCOUNTER — Ambulatory Visit (HOSPITAL_BASED_OUTPATIENT_CLINIC_OR_DEPARTMENT_OTHER)
Admission: RE | Admit: 2018-09-15 | Discharge: 2018-09-15 | Disposition: A | Discharge status: Home / Self Care | Payer: BLUE CROSS/BLUE SHIELD | Source: Ambulatory Visit | Attending: Cardiology | Admitting: Cardiology

## 2018-09-15 ENCOUNTER — Ambulatory Visit (HOSPITAL_COMMUNITY)
Admission: RE | Admit: 2018-09-15 | Discharge: 2018-09-15 | Disposition: A | Discharge status: Home / Self Care | Payer: BLUE CROSS/BLUE SHIELD | Source: Ambulatory Visit | Attending: Family Medicine | Admitting: Family Medicine

## 2018-09-15 VITALS — BP 80/50 | HR 98 | Wt 159.0 lb

## 2018-09-15 DIAGNOSIS — Z79899 Other long term (current) drug therapy: Secondary | ICD-10-CM | POA: Insufficient documentation

## 2018-09-15 DIAGNOSIS — Z09 Encounter for follow-up examination after completed treatment for conditions other than malignant neoplasm: Secondary | ICD-10-CM | POA: Insufficient documentation

## 2018-09-15 DIAGNOSIS — Z803 Family history of malignant neoplasm of breast: Secondary | ICD-10-CM | POA: Insufficient documentation

## 2018-09-15 DIAGNOSIS — C50911 Malignant neoplasm of unspecified site of right female breast: Secondary | ICD-10-CM | POA: Diagnosis not present

## 2018-09-15 DIAGNOSIS — Z170 Estrogen receptor positive status [ER+]: Secondary | ICD-10-CM

## 2018-09-15 DIAGNOSIS — Z79890 Hormone replacement therapy (postmenopausal): Secondary | ICD-10-CM | POA: Insufficient documentation

## 2018-09-15 DIAGNOSIS — C50411 Malignant neoplasm of upper-outer quadrant of right female breast: Secondary | ICD-10-CM

## 2018-09-15 DIAGNOSIS — R000 Tachycardia, unspecified: Secondary | ICD-10-CM | POA: Insufficient documentation

## 2018-09-15 DIAGNOSIS — I5030 Unspecified diastolic (congestive) heart failure: Secondary | ICD-10-CM | POA: Diagnosis not present

## 2018-09-15 DIAGNOSIS — I428 Other cardiomyopathies: Secondary | ICD-10-CM | POA: Diagnosis not present

## 2018-09-15 NOTE — Progress Notes (Signed)
Echocardiogram 2D Echocardiogram has been performed.  Matilde Bash 09/15/2018, 1:47 PM

## 2018-09-15 NOTE — Patient Instructions (Signed)
Your physician has requested that you have an echocardiogram. Echocardiography is a painless test that uses sound waves to create images of your heart. It provides your doctor with information about the size and shape of your heart and how well your heart's chambers and valves are working. This procedure takes approximately one hour. There are no restrictions for this procedure.  Your physician recommends that you schedule a follow-up appointment in: 3 months with Dr McLean  

## 2018-09-16 ENCOUNTER — Telehealth (HOSPITAL_COMMUNITY): Payer: Self-pay

## 2018-09-16 DIAGNOSIS — E7800 Pure hypercholesterolemia, unspecified: Secondary | ICD-10-CM | POA: Diagnosis not present

## 2018-09-16 DIAGNOSIS — E039 Hypothyroidism, unspecified: Secondary | ICD-10-CM | POA: Diagnosis not present

## 2018-09-16 NOTE — Telephone Encounter (Signed)
Pt surgical clearance was faxed on 09/15/18 to gynecologic oncology [facility]

## 2018-09-16 NOTE — Progress Notes (Signed)
Oncology: Dr. Burr Medico Cardiology: Dr. Aundra Dubin  42 y.o. with history of breath cancer was referred by Dr. Burr Medico for cardio-oncology evaluation given planned Herceptin use.  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  She had bilateral mastectomies and axillary node biopsy in 7/18.  Planned for Taxol/carboplatin/Herceptin/Perjeta q 3 wks x 6 cycles starting 10/18, then maintenance Herceptin to complete a year.  She finished the initial chemotherapy and has finished radiation.   No history of cardiac problems.  Nonsmoker.   Echo in 12/18 showed significantly less negative strain though EF was preserved.  I had her stop Herceptin for a month and started Coreg 3.125 mg bid. Echo in 1/19 showed improved strain so Herceptin was restarted.  Echo in 5/19 showed fall in EF to 45-50% so Herceptin was held and she was started on Coreg and losartan. Echo in 7/19 showed that EF remained in the 45-50% range. Echo in 8/19 showed EF up to 55-60%, and Herceptin was restarted.   She returns for followup of cardiomyopathy.  BP is low and has prevented any further uptitration of her cardiac meds.  She really has minimal lightheadedness despite the low BP.  No falls.  No exertional dyspnea, orthopnea/PND.  She does have generalized fatigue.  Current plan is to continue Herceptin to 3/19.   Labs (5/19): K 3.8, creatinine 0.84 => 0.94 Labs (7/19): creatinine 1.02  PMH: 1. Breast cancer: Diagnosed 7/18 on right, ER+/PR+/HER2+.  She had bilateral mastectomies and axillary node biopsy in 7/18.  Planned for Taxol/carboplatin/Herceptin/Perjeta q 3 wks x 6 cycles starting 10/12, then maintenance Herceptin to complete a year.  2. Herceptin-related cardiomyopathy:  - Echo (9/18): EF 57-84%, normal diastolic function, GLS -69.6%.  - Echo (12/18): EF 29-52%, normal diastolic function, normal RV size and systolic function, GLS -84.1%.  - Echo (1/19): EF 60%, GLS -15%, normal RV size and systolic function.  - Echo (5/19):  EF 45-50%, GLS -17.2% - Echo (7/19): EF 45-50%, GLS -16% - Echo (8/19): EF 55-60%, GLS -19.4% - Echo (10/19): EF 55-60%, GLS -18.2%, normal RV size and systolic function  Family History  Problem Relation Age of Onset  . Breast cancer Paternal Grandmother 27  . Colon cancer Paternal Grandmother 55  . Breast cancer Mother 59  . Osteoporosis Mother   . Prostate cancer Paternal Grandfather        dx in his 48s  . Leukemia Paternal Grandfather   . Lung cancer Maternal Uncle        heavy smoker  . Other Paternal Uncle        farm accident  . Stroke Maternal Grandfather    Social History   Socioeconomic History  . Marital status: Single    Spouse name: Not on file  . Number of children: Not on file  . Years of education: Not on file  . Highest education level: Not on file  Occupational History  . Not on file  Social Needs  . Financial resource strain: Not on file  . Food insecurity:    Worry: Not on file    Inability: Not on file  . Transportation needs:    Medical: Not on file    Non-medical: Not on file  Tobacco Use  . Smoking status: Never Smoker  . Smokeless tobacco: Former Network engineer and Sexual Activity  . Alcohol use: Yes    Comment: once a year  . Drug use: No  . Sexual activity: Yes  Lifestyle  . Physical activity:  Days per week: Not on file    Minutes per session: Not on file  . Stress: Not on file  Relationships  . Social connections:    Talks on phone: Not on file    Gets together: Not on file    Attends religious service: Not on file    Active member of club or organization: Not on file    Attends meetings of clubs or organizations: Not on file    Relationship status: Not on file  . Intimate partner violence:    Fear of current or ex partner: Not on file    Emotionally abused: Not on file    Physically abused: Not on file    Forced sexual activity: Not on file  Other Topics Concern  . Not on file  Social History Narrative  . Not on file    ROS: All systems reviewed and negative except as per HPI.  Current Outpatient Medications  Medication Sig Dispense Refill  . anastrozole (ARIMIDEX) 1 MG tablet Take 1 tablet (1 mg total) by mouth daily. 30 tablet 1  . carvedilol (COREG) 3.125 MG tablet TAKE 1 TABLET BY MOUTH 2 TIMES DAILY. 60 tablet 3  . Cholecalciferol (VITAMIN D) 2000 units tablet Take 2,000 Units by mouth daily.    . cyclobenzaprine (FLEXERIL) 5 MG tablet Take 1 tablet (5 mg total) by mouth 2 (two) times daily as needed for muscle spasms. 30 tablet 0  . diphenoxylate-atropine (LOMOTIL) 2.5-0.025 MG tablet Take 1-2 tablets by mouth 4 (four) times daily as needed for diarrhea or loose stools. 30 tablet 0  . levothyroxine (SYNTHROID, LEVOTHROID) 25 MCG tablet Take 25 mcg by mouth daily before breakfast.    . losartan (COZAAR) 25 MG tablet TAKE 1/2 TABLET BY MOUTH AT BEDTIME. 15 tablet 3  . Melatonin 10 MG TABS Take 1 tablet by mouth.     . naproxen sodium (ANAPROX) 220 MG tablet Take 220 mg 2 (two) times daily as needed by mouth (pain).     . Omega-3 Fatty Acids (OMEGA-3 FISH OIL PO) Take 100 Units by mouth.    . venlafaxine XR (EFFEXOR-XR) 150 MG 24 hr capsule TAKE 1 CAPSULE (150 MG TOTAL) BY MOUTH DAILY WITH BREAKFAST. 30 capsule 3  . zolpidem (AMBIEN) 5 MG tablet Take 1 tablet (5 mg total) by mouth at bedtime as needed for sleep. 30 tablet 1   Current Facility-Administered Medications  Medication Dose Route Frequency Provider Last Rate Last Dose  . gi cocktail (Maalox,Lidocaine,Donnatal)  30 mL Oral Once Tanner, Lucianne Lei E., PA-C       BP (!) 80/50   Pulse 98   Wt 72.1 kg (159 lb)   SpO2 99%   BMI 25.66 kg/m  General: NAD Neck: No JVD, no thyromegaly or thyroid nodule.  Lungs: Clear to auscultation bilaterally with normal respiratory effort. CV: Nondisplaced PMI.  Heart regular S1/S2, no S3/S4, no murmur.  No peripheral edema.  No carotid bruit.  Normal pedal pulses.  Abdomen: Soft, nontender, no hepatosplenomegaly,  no distention.  Skin: Intact without lesions or rashes.  Neurologic: Alert and oriented x 3.  Psych: Normal affect. Extremities: No clubbing or cyanosis.  HEENT: Normal.   Assessment/Plan:  1. Breast cancer: 1 year of Herceptin had been planned.  Her initial echo was normal. 12/18 echo showed that EF remained 60-65% but global longitudinal strain measurement was significantly less negative, down to -13.5% from -20.2%.  I had her stop Herceptin for a month and she had a  repeat echo in 1/19 with stable EF and more negative strain, Herceptin was restarted. In 5/19, EF was down to 45-50% so Herceptin held.  Repeat echo in 7/19 showed EF still 45-50%.  I suspect this was related to Herceptin.  Echo in 8/19 showed improved EF to 55-60% and improved strain.  Herceptin was restarted with plan to continue through 3/19.  Echo was reviewed today.  EF remains 55-60% with stable strain parameters.   - Repeat echo in 3 months with office visit.  - She will need to continue current Coreg and losartan (no BP room to titrate).   2. Sinus tachycardia: HR in 90s today, improved.   Followup in 3 months with echo.   Loralie Champagne 09/16/2018

## 2018-09-17 ENCOUNTER — Other Ambulatory Visit: Payer: BLUE CROSS/BLUE SHIELD

## 2018-09-17 ENCOUNTER — Ambulatory Visit: Payer: BLUE CROSS/BLUE SHIELD | Admitting: Hematology

## 2018-09-17 ENCOUNTER — Ambulatory Visit: Payer: BLUE CROSS/BLUE SHIELD

## 2018-09-18 ENCOUNTER — Other Ambulatory Visit: Payer: BLUE CROSS/BLUE SHIELD

## 2018-09-18 ENCOUNTER — Ambulatory Visit: Payer: BLUE CROSS/BLUE SHIELD | Admitting: Hematology

## 2018-09-18 ENCOUNTER — Ambulatory Visit: Payer: BLUE CROSS/BLUE SHIELD

## 2018-09-21 ENCOUNTER — Inpatient Hospital Stay: Payer: BLUE CROSS/BLUE SHIELD

## 2018-09-21 ENCOUNTER — Encounter: Payer: Self-pay | Admitting: Nurse Practitioner

## 2018-09-21 ENCOUNTER — Inpatient Hospital Stay: Payer: BLUE CROSS/BLUE SHIELD | Attending: Nurse Practitioner

## 2018-09-21 ENCOUNTER — Inpatient Hospital Stay (HOSPITAL_BASED_OUTPATIENT_CLINIC_OR_DEPARTMENT_OTHER): Payer: BLUE CROSS/BLUE SHIELD | Admitting: Nurse Practitioner

## 2018-09-21 VITALS — BP 104/63 | HR 116 | Temp 98.2°F | Resp 16 | Ht 66.0 in | Wt 158.5 lb

## 2018-09-21 DIAGNOSIS — R197 Diarrhea, unspecified: Secondary | ICD-10-CM | POA: Insufficient documentation

## 2018-09-21 DIAGNOSIS — Z5112 Encounter for antineoplastic immunotherapy: Secondary | ICD-10-CM | POA: Diagnosis not present

## 2018-09-21 DIAGNOSIS — Z9221 Personal history of antineoplastic chemotherapy: Secondary | ICD-10-CM

## 2018-09-21 DIAGNOSIS — Z9013 Acquired absence of bilateral breasts and nipples: Secondary | ICD-10-CM | POA: Diagnosis not present

## 2018-09-21 DIAGNOSIS — R110 Nausea: Secondary | ICD-10-CM | POA: Diagnosis not present

## 2018-09-21 DIAGNOSIS — R5383 Other fatigue: Secondary | ICD-10-CM | POA: Insufficient documentation

## 2018-09-21 DIAGNOSIS — R202 Paresthesia of skin: Secondary | ICD-10-CM | POA: Insufficient documentation

## 2018-09-21 DIAGNOSIS — I429 Cardiomyopathy, unspecified: Secondary | ICD-10-CM

## 2018-09-21 DIAGNOSIS — G4700 Insomnia, unspecified: Secondary | ICD-10-CM | POA: Diagnosis not present

## 2018-09-21 DIAGNOSIS — Z23 Encounter for immunization: Secondary | ICD-10-CM | POA: Diagnosis not present

## 2018-09-21 DIAGNOSIS — E559 Vitamin D deficiency, unspecified: Secondary | ICD-10-CM

## 2018-09-21 DIAGNOSIS — C773 Secondary and unspecified malignant neoplasm of axilla and upper limb lymph nodes: Secondary | ICD-10-CM | POA: Insufficient documentation

## 2018-09-21 DIAGNOSIS — Z79811 Long term (current) use of aromatase inhibitors: Secondary | ICD-10-CM | POA: Insufficient documentation

## 2018-09-21 DIAGNOSIS — C50411 Malignant neoplasm of upper-outer quadrant of right female breast: Secondary | ICD-10-CM

## 2018-09-21 DIAGNOSIS — Z95828 Presence of other vascular implants and grafts: Secondary | ICD-10-CM

## 2018-09-21 DIAGNOSIS — R232 Flushing: Secondary | ICD-10-CM

## 2018-09-21 DIAGNOSIS — Z803 Family history of malignant neoplasm of breast: Secondary | ICD-10-CM | POA: Diagnosis not present

## 2018-09-21 DIAGNOSIS — D649 Anemia, unspecified: Secondary | ICD-10-CM | POA: Diagnosis not present

## 2018-09-21 DIAGNOSIS — Z170 Estrogen receptor positive status [ER+]: Secondary | ICD-10-CM | POA: Insufficient documentation

## 2018-09-21 DIAGNOSIS — F418 Other specified anxiety disorders: Secondary | ICD-10-CM

## 2018-09-21 DIAGNOSIS — Z79899 Other long term (current) drug therapy: Secondary | ICD-10-CM

## 2018-09-21 DIAGNOSIS — E7800 Pure hypercholesterolemia, unspecified: Secondary | ICD-10-CM | POA: Diagnosis not present

## 2018-09-21 DIAGNOSIS — Z87442 Personal history of urinary calculi: Secondary | ICD-10-CM | POA: Insufficient documentation

## 2018-09-21 LAB — CBC WITH DIFFERENTIAL/PLATELET
Abs Immature Granulocytes: 0 10*3/uL (ref 0.00–0.07)
BASOS PCT: 1 %
Basophils Absolute: 0 10*3/uL (ref 0.0–0.1)
EOS ABS: 0.1 10*3/uL (ref 0.0–0.5)
EOS PCT: 3 %
HEMATOCRIT: 35 % — AB (ref 36.0–46.0)
Hemoglobin: 11.6 g/dL — ABNORMAL LOW (ref 12.0–15.0)
IMMATURE GRANULOCYTES: 0 %
LYMPHS ABS: 0.7 10*3/uL (ref 0.7–4.0)
Lymphocytes Relative: 19 %
MCH: 30.4 pg (ref 26.0–34.0)
MCHC: 33.1 g/dL (ref 30.0–36.0)
MCV: 91.6 fL (ref 80.0–100.0)
Monocytes Absolute: 0.2 10*3/uL (ref 0.1–1.0)
Monocytes Relative: 6 %
NEUTROS PCT: 71 %
Neutro Abs: 2.7 10*3/uL (ref 1.7–7.7)
PLATELETS: 166 10*3/uL (ref 150–400)
RBC: 3.82 MIL/uL — ABNORMAL LOW (ref 3.87–5.11)
RDW: 12.5 % (ref 11.5–15.5)
WBC: 3.8 10*3/uL — ABNORMAL LOW (ref 4.0–10.5)
nRBC: 0 % (ref 0.0–0.2)

## 2018-09-21 LAB — COMPREHENSIVE METABOLIC PANEL
ALK PHOS: 107 U/L (ref 38–126)
ALT: 10 U/L (ref 0–44)
ANION GAP: 9 (ref 5–15)
AST: 10 U/L — ABNORMAL LOW (ref 15–41)
Albumin: 3.6 g/dL (ref 3.5–5.0)
BILIRUBIN TOTAL: 0.4 mg/dL (ref 0.3–1.2)
BUN: 14 mg/dL (ref 6–20)
CALCIUM: 9 mg/dL (ref 8.9–10.3)
CO2: 24 mmol/L (ref 22–32)
CREATININE: 1.17 mg/dL — AB (ref 0.44–1.00)
Chloride: 102 mmol/L (ref 98–111)
GFR calc non Af Amer: 57 mL/min — ABNORMAL LOW (ref >60)
Glucose, Bld: 148 mg/dL — ABNORMAL HIGH (ref 70–99)
Potassium: 4.3 mmol/L (ref 3.5–5.1)
Sodium: 135 mmol/L (ref 135–145)
TOTAL PROTEIN: 7.3 g/dL (ref 6.5–8.1)

## 2018-09-21 MED ORDER — SODIUM CHLORIDE 0.9 % IV SOLN
420.0000 mg | Freq: Once | INTRAVENOUS | Status: AC
  Administered 2018-09-21: 420 mg via INTRAVENOUS
  Filled 2018-09-21: qty 14

## 2018-09-21 MED ORDER — HEPARIN SOD (PORK) LOCK FLUSH 100 UNIT/ML IV SOLN
500.0000 [IU] | Freq: Once | INTRAVENOUS | Status: AC | PRN
  Administered 2018-09-21: 500 [IU]
  Filled 2018-09-21: qty 5

## 2018-09-21 MED ORDER — SODIUM CHLORIDE 0.9% FLUSH
10.0000 mL | INTRAVENOUS | Status: DC | PRN
  Administered 2018-09-21: 10 mL
  Filled 2018-09-21: qty 10

## 2018-09-21 MED ORDER — SODIUM CHLORIDE 0.9 % IV SOLN
Freq: Once | INTRAVENOUS | Status: AC
  Administered 2018-09-21: 14:00:00 via INTRAVENOUS
  Filled 2018-09-21: qty 250

## 2018-09-21 MED ORDER — INFLUENZA VAC SPLIT QUAD 0.5 ML IM SUSY
0.5000 mL | PREFILLED_SYRINGE | Freq: Once | INTRAMUSCULAR | Status: AC
  Administered 2018-09-21: 0.5 mL via INTRAMUSCULAR

## 2018-09-21 MED ORDER — SODIUM CHLORIDE 0.9% FLUSH
10.0000 mL | INTRAVENOUS | Status: DC | PRN
  Administered 2018-09-21: 10 mL via INTRAVENOUS
  Filled 2018-09-21: qty 10

## 2018-09-21 MED ORDER — DIPHENHYDRAMINE HCL 25 MG PO CAPS: 50.0000 mg | ORAL_CAPSULE | Freq: Once | ORAL | Status: DC

## 2018-09-21 MED ORDER — INFLUENZA VAC SPLIT QUAD 0.5 ML IM SUSY
PREFILLED_SYRINGE | INTRAMUSCULAR | Status: AC
  Filled 2018-09-21: qty 0.5

## 2018-09-21 MED ORDER — ACETAMINOPHEN 325 MG PO TABS
ORAL_TABLET | ORAL | Status: AC
  Filled 2018-09-21: qty 2

## 2018-09-21 MED ORDER — ACETAMINOPHEN 325 MG PO TABS
650.0000 mg | ORAL_TABLET | Freq: Once | ORAL | Status: AC
  Administered 2018-09-21: 650 mg via ORAL

## 2018-09-21 MED ORDER — TRASTUZUMAB CHEMO 150 MG IV SOLR
450.0000 mg | Freq: Once | INTRAVENOUS | Status: AC
  Administered 2018-09-21: 450 mg via INTRAVENOUS
  Filled 2018-09-21: qty 21.43

## 2018-09-21 NOTE — Patient Instructions (Addendum)
[facility] Discharge Instructions for Patients Receiving Chemotherapy  Today you received the following chemotherapy agents: Trastuzumab (Herceptin) and Pertuzumab (Perjeta)  To help prevent nausea and vomiting after your treatment, we encourage you to take your nausea medication as directed.    If you develop nausea and vomiting that is not controlled by your nausea medication, call the clinic.   BELOW ARE SYMPTOMS THAT SHOULD BE REPORTED IMMEDIATELY:  *FEVER GREATER THAN 100.5 F  *CHILLS WITH OR WITHOUT FEVER  NAUSEA AND VOMITING THAT IS NOT CONTROLLED WITH YOUR NAUSEA MEDICATION  *UNUSUAL SHORTNESS OF BREATH  *UNUSUAL BRUISING OR BLEEDING  TENDERNESS IN MOUTH AND THROAT WITH OR WITHOUT PRESENCE OF ULCERS  *URINARY PROBLEMS  *BOWEL PROBLEMS  UNUSUAL RASH Items with * indicate a potential emergency and should be followed up as soon as possible.  Feel free to call the clinic should you have any questions or concerns. The clinic phone number is [phone].  Please show the [facility] at check-in to the Emergency Department and triage nurse.  Influenza (Flu) Vaccine (Inactivated or Recombinant): What You Need to Know 1. Why get vaccinated? Influenza ("flu") is a contagious disease that spreads around the [place] every year, usually between October and May. Flu is caused by influenza viruses, and is spread mainly by coughing, sneezing, and close contact. Anyone can get flu. Flu strikes suddenly and can last several days. Symptoms vary by age, but can include:  fever/chills  sore throat  muscle aches  fatigue  cough  headache  runny or stuffy nose  Flu can also lead to pneumonia and blood infections, and cause diarrhea and seizures in children. If you have a medical condition, such as heart or lung disease, flu can make it worse. Flu is more dangerous for some people. Infants and young children, people 83 years of age and older,  pregnant women, and people with certain health conditions or a weakened immune system are at greatest risk. Each year thousands of people in the [place] die from flu, and many more are hospitalized. Flu vaccine can:  keep you from getting flu,  make flu less severe if you do get it, and  keep you from spreading flu to your family and other people. 2. Inactivated and recombinant flu vaccines A dose of flu vaccine is recommended every flu season. Children 6 months through 73 years of age may need two doses during the same flu season. Everyone else needs only one dose each flu season. Some inactivated flu vaccines contain a very small amount of a mercury-based preservative called thimerosal. Studies have not shown thimerosal in vaccines to be harmful, but flu vaccines that do not contain thimerosal are available. There is no live flu virus in flu shots. They cannot cause the flu. There are many flu viruses, and they are always changing. Each year a new flu vaccine is made to protect against three or four viruses that are likely to cause disease in the upcoming flu season. But even when the vaccine doesn't exactly match these viruses, it may still provide some protection. Flu vaccine cannot prevent:  flu that is caused by a virus not covered by the vaccine, or  illnesses that look like flu but are not.  It takes about 2 weeks for protection to develop after vaccination, and protection lasts through the flu season. 3. Some people should not get this vaccine Tell the person who is giving you the vaccine:  If you have any  severe, life-threatening allergies. If you ever had a life-threatening allergic reaction after a dose of flu vaccine, or have a severe allergy to any part of this vaccine, you may be advised not to get vaccinated. Most, but not all, types of flu vaccine contain a small amount of egg protein.  If you ever had Guillain-Barr Syndrome (also called GBS). Some people with a  history of GBS should not get this vaccine. This should be discussed with your doctor.  If you are not feeling well. It is usually okay to get flu vaccine when you have a mild illness, but you might be asked to come back when you feel better.  4. Risks of a vaccine reaction With any medicine, including vaccines, there is a chance of reactions. These are usually mild and go away on their own, but serious reactions are also possible. Most people who get a flu shot do not have any problems with it. Minor problems following a flu shot include:  soreness, redness, or swelling where the shot was given  hoarseness  sore, red or itchy eyes  cough  fever  aches  headache  itching  fatigue  If these problems occur, they usually begin soon after the shot and last 1 or 2 days. More serious problems following a flu shot can include the following:  There may be a small increased risk of Guillain-Barre Syndrome (GBS) after inactivated flu vaccine. This risk has been estimated at 1 or 2 additional cases per million people vaccinated. This is much lower than the risk of severe complications from flu, which can be prevented by flu vaccine.  Young children who get the flu shot along with pneumococcal vaccine (PCV13) and/or DTaP vaccine at the same time might be slightly more likely to have a seizure caused by fever. Ask your doctor for more information. Tell your doctor if a child who is getting flu vaccine has ever had a seizure.  Problems that could happen after any injected vaccine:  People sometimes faint after a medical procedure, including vaccination. Sitting or lying down for about 15 minutes can help prevent fainting, and injuries caused by a fall. Tell your doctor if you feel dizzy, or have vision changes or ringing in the ears.  Some people get severe pain in the shoulder and have difficulty moving the arm where a shot was given. This happens very rarely.  Any medication can cause a  severe allergic reaction. Such reactions from a vaccine are very rare, estimated at about 1 in a million doses, and would happen within a few minutes to a few hours after the vaccination. As with any medicine, there is a very remote chance of a vaccine causing a serious injury or death. The safety of vaccines is always being monitored. For more information, visit: http://www.aguilar.org/ 5. What if there is a serious reaction? What should I look for? Look for anything that concerns you, such as signs of a severe allergic reaction, very high fever, or unusual behavior. Signs of a severe allergic reaction can include hives, swelling of the face and throat, difficulty breathing, a fast heartbeat, dizziness, and weakness. These would start a few minutes to a few hours after the vaccination. What should I do?  If you think it is a severe allergic reaction or other emergency that can't wait, call 9-1-1 and get the person to the nearest hospital. Otherwise, call your doctor.  Reactions should be reported to the Vaccine Adverse Event Reporting System (VAERS). Your doctor should  file this report, or you can do it yourself through the VAERS web site at www.vaers.SamedayNews.es, or by calling (206)295-9809. ? VAERS does not give medical advice. 6. The National Vaccine Injury Compensation Program The [employer] Vaccine Injury Compensation Program (VICP) is a federal program that was created to compensate people who may have been injured by certain vaccines. Persons who believe they may have been injured by a vaccine can learn about the program and about filing a claim by calling 7314249731 or visiting the [facility] website at GoldCloset.com.ee. There is a time limit to file a claim for compensation. 7. How can I learn more?  Ask your healthcare provider. He or she can give you the vaccine package insert or suggest other sources of information.  Call your local or state health department.  Contact  the Centers for Disease Control and Prevention (CDC): ? Call 720 857 2357 (1-800-CDC-INFO) or ? Visit CDC's website at https://gibson.com/ Vaccine Information Statement, Inactivated Influenza Vaccine (06/24/2014) This information is not intended to replace advice given to you by your health care provider. Make sure you discuss any questions you have with your health care provider. Document Released: 08/29/2006 Document Revised: 07/25/2016 Document Reviewed: 07/25/2016 Elsevier Interactive Patient Education  2017 [employer].

## 2018-09-21 NOTE — Progress Notes (Signed)
Sheila [name]  [phone] [phone]  Clinic Follow up Note   Patient Care Team: [name], MD as PCP - General (Family Medicine) [name] (Family Medicine) [name], MD as Consulting Physician (General Surgery) [name], MD as Consulting Physician (Hematology) Kyung Rudd, MD as Consulting Physician (Radiation Oncology) 09/21/2018  SUMMARY OF ONCOLOGIC HISTORY: Oncology History    Cancer Staging Malignant neoplasm of upper-outer quadrant of right breast in female, estrogen receptor positive ([place]) Staging form: Breast, AJCC 8th Edition - Clinical stage from 06/11/2017: Stage IB (cT3, cN0, cM0, G2, ER: Positive, PR: Positive, HER2: Positive) - Signed by [name], MD on 06/16/2017 - Pathologic stage from 07/22/2017: Stage IB (pT3(m), pN2a(sn), cM0, G2, ER: Positive, PR: Positive, HER2: Positive) - Signed by [name], NP on 08/04/2017      Malignant neoplasm of upper-outer quadrant of right breast in female, estrogen receptor positive ([place])   06/11/2017 Initial Biopsy    Diagnosis 06/11/17 Breast, right, needle core biopsy, 11:00 o'clock - INVASIVE DUCTAL CARCINOMA, G2    06/11/2017 Mammogram    [name] and [facility] Diagnostic Breast Tomo Bilateral 06/11/17 IMPRESSION: 1. Highly suspicious mass within the retroareolar right breast, extending from the 8:00 to 11:00 axes, anterior to posterior depth, extending anteriorly to the nipple with associated nipple retraction, measuring at least 5.5 cm by ultrasound  2. No sonographic evidence of metastatic lymphadenopathy in the right axilla. 3. No evidence of malignancy within the left breast.     06/11/2017 Initial Diagnosis    Malignant neoplasm of upper-outer quadrant of right breast in female, estrogen receptor positive ([place])    06/11/2017 Receptors her2    Estrogen Receptor: 100%, POSITIVE, STRONG STAINING INTENSITY Progesterone Receptor: 100%, POSITIVE, STRONG STAINING  INTENSITY Proliferation Marker Ki67: 12%Proliferation Marker Ki67: 12%  HER2 - **POSITIVE** RATIO OF HER2/CEP17 SIGNALS 2.55 AVERAGE HER2 COPY NUMBER PER CELL 4.85    06/25/2017 Genetic Testing    Negative genetic testing on the 9 gene STAT panel.  The STAT Breast cancer panel offered by [facility] includes sequencing and rearrangement analysis for the following 9 genes:  ATM, BRCA1, BRCA2, CDH1, CHEK2, PALB2, PTEN, STK11 and TP53.   The report date is June 25, 2017.  Negative genetic testing on the common hereditary cancer panel.  The Hereditary Gene Panel offered by [facility] includes sequencing and/or deletion duplication testing of the following 46 genes: APC, ATM, AXIN2, BARD1, BMPR1A, BRCA1, BRCA2, BRIP1, CDH1, CDKN2A (p14ARF), CDKN2A (p16INK4a), CHEK2, CTNNA1, DICER1, EPCAM (Deletion/duplication testing only), GREM1 (promoter region deletion/duplication testing only), KIT, MEN1, MLH1, MSH2, MSH3, MSH6, MUTYH, NBN, NF1, NHTL1, PALB2, PDGFRA, PMS2, POLD1, POLE, PTEN, RAD50, RAD51C, RAD51D, SDHB, SDHC, SDHD, SMAD4, SMARCA4. STK11, TP53, TSC1, TSC2, and VHL.  The following genes were evaluated for sequence changes only: SDHA and HOXB13 c.251G>A variant only.  The report date is June 25, 2017.     07/02/2017 Imaging    CT cAP 07/02/17 IMPRESSION: 1. Subareolar right breast mass. No compelling findings of nodal or metastatic involvement. 2. Old granulomatous disease. 3. There is a 7 mm enhancing or hyperdense focus posteriorly in segment 7 of the liver in the subcapsular region. By virtue of its small size this lesion is technically nonspecific although statistically likely to be a small benign lesions such as flash filling hemangioma. This may warrant surveillance. 4.  Prominent stool throughout the colon favors constipation. 5. Degenerative disc disease and spondylosis at L5-S1 likely causing mild impingement.    07/02/2017 Imaging  Bone scan 07/02/17 IMPRESSION: Today' s exam is negative.  I ascribed the tiny focus of activity just proximal to the left antecubital region to injection site.    07/22/2017 Surgery    RIGHT TOTAL MASTECTOMY WITH RIGHT AXILLARY SENTINEL LYMPH NODE BIOPSY, LEFT PROPHYLACTIC MASTECTOM and INSERTION PORT-A-CATH WITH [name] by [name] and [facility]     07/22/2017 Pathology Results    Diagnosis 1. Breast, simple mastectomy, Left - FIBROADENOMA. - NO MALIGNANCY IDENTIFIED. 2. 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 - ADDITIONAL FOCUS OF LOBULAR CARCINOMA, GRADE 2, SPANNING 2.2 CM. - INTERMEDIATE GRADE DUCTAL CARCINOMA IN SITU. - INVASIVE CARCINOMA COMES TO WITHIN 0.2 TO 0.3 CM OF THE DEEP MARGIN, FOCALLY. - TUMOR FOCALLY INVOLVES EPIDERMIS. - LYMPHOVASCULAR INVASION OF DERMAL LYMPHATICS. - ONE OF ONE LYMPH NODES NEGATIVE FOR CARCINOMA (0/1). - SEE ONCOLOGY TABLE. 3. Lymph node, sentinel, biopsy, Right axillary - ONE LYMPH NODE WITH ISOLATED TUMOR CELLS (0/1). 4. Lymph node, sentinel, biopsy, Right - METASTATIC CARCINOMA IN ONE OF ONE LYMPH NODES (1/1). 5. Lymph node, sentinel, biopsy, Right - ONE OF ONE LYMPH NODES NEGATIVE FOR CARCINOMA (0/1). 6. Lymph node, sentinel, biopsy, Right - METASTATIC CARCINOMA IN ONE OF ONE LYMPH NODES (1/1). - EXTRACAPSULAR EXTENSION. 7. Lymph node, sentinel, biopsy, Right - METASTATIC CARCINOMA IN ONE OF ONE LYMPH NODES (1/1). - EXTRACAPSULAR EXTENSION. 8. Lymph node, sentinel, biopsy, Right - ONE OF ONE LYMPH NODES NEGATIVE FOR CARCINOMA (0/1). 9. Lymph node, sentinel, biopsy, Right - ONE OF ONE LYMPH NODES NEGATIVE FOR CARCINOMA (0/1). 10. Lymph node, sentinel, biopsy, Right - METASTATIC CARCINOMA IN ONE OF ONE LYMPH NODES (1/1). - EXTRACAPSULAR EXTENSION. 11. Lymph node, biopsy, Right axillary - ONE OF ONE LYMPH NODES NEGATIVE FOR CARCINOMA (0/1).     07/22/2017 Receptors her2    Ductal carcinoma in primary breast tumor and node metastasis  are ER 95%, PR 95% strongly positive, and HER2 + The lobular component in primary breast tumor is ER 95% positive, PR 95% positive, strong staining, HER-2 negative.   Original biopsy [id number]: Ductal morphology, Her2 FISH positive. Current specimen: Main focus is 75% ductal morphology, 25% lobular morphology. Smaller focus is lobular morphology. #2A Lobular morphology, Her IHC negative. #2E Lobular morphology, Her2 FISH negative, Her2 IHC negative. #4 50% lobular and 50% ductal morphology, Her2 FISH positive, Her2 IHC negative. #6 Ductal morphology #7 Ductal morphology #10 50% lobular, 50% ductal morphology    08/25/2017 Imaging    MRI abdomen done at [facility]:  Enhancing structure measing 5 mm is compatible with a flash filling hemangioma. No specific findings to suggest metastatic cancer    08/25/2017 Imaging    MRI Abdomen at [facility] 08/25/17  IMPRESSION: 1. Lesion in segment 7 of the liver has signal and enhancement characteristics compatible with a flash filling hemangioma. No specific findings identified to suggest metastatic disease.    08/29/2017 - 12/12/2017 Chemotherapy    adjuvant TCHP every 3 weeks for 6 cycles starting 08/29/17, followed by maintenance Herceptin with or without Perjeta for 6-12 months. Postponed and decreased Carbo to 663m and added Onpro with cycle 2 on 09/19/17 due to neutropenic fever. Carbplatin further reduced to 5038mstarting with cycle 3 due to thrombocytopenia. Herceptin was held for Cycle 4 and 5 due to worsening global longitudinal strain found on ECHO from 11/07/17.        09/04/2017 - 09/09/2017 Hospital Admission    Admit date: 09/04/2017 Discharge date: 09/09/2017  DISCHARGE  DIAGNOSES:  Principal Problem:   Sepsis ([place]) Active Problems:   Malignant neoplasm of upper-outer quadrant of right breast in female, estrogen receptor positive ([place])   Port-A-Cath in place   Neutropenic fever ([place])   Hyponatremia   Malignant neoplasm of breast  in female, estrogen receptor positive ([place])     12/28/2017 Imaging    CT CHEST IMPRESSION: 1. 2.3 x 2.7 x 1.2 cm irregular masslike opacity within the right middle lobe worrisome for malignancy/metastasis. 2. 3 new very small sclerotic lesions, within the sternum and the body of T4, worrisome for new metastases.    12/28/2017 - 01/05/2018 Hospital Admission    Admit date: 12/28/17-01/05/18 Admission diagnosis: multifocal pneumonia, influenza A, sepsis, hypoxia Additional comments: imaging during hospitalization notable for new irregular masslike opacity in the right middle lobe worrisome for metastasis and new very small sclerotic lesion within the sternum and the body of T4, worrisome for metastasis    12/29/2017 Imaging    CT ABD/PELVIS IMPRESSION: No definite acute intra-abdominal or intrapelvic abnormalities.  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    12/30/2017 Imaging    CT HEAD IMPRESSION: No acute intracranial abnormality or enhancing lesion.    02/04/2018 PET scan    IMPRESSION: 1. The previous consolidation in the right middle lobe currently has a more nodular and reticular appearance with some associated mild atelectasis along its anterior margin. This demonstrates low-grade metabolic activity with maximum SUV 3.2 (background blood pool activity is 2.7). Although conceivably related to low-grade residuum from the patient's malignancy, the presence of calcified right hilar lymph nodes and small calcifications along the nodularity raise suspicion for a low-grade active granulomatous process or atypical infectious process. Surveillance is recommended. 2. Scattered deposits of hypermetabolic but benign metabolically active brown fat.    03/20/2018 -  Anti-estrogen oral therapy    -Monthly Zoladex injection starting 03/20/18  -Letrozole 2.54m once dialy starting 05/03/18  -Anastrozole started 08/2018    04/06/2018 Imaging    CT Chest wo  contrast IMPRESSION: 1. Stable linear nodular thickening in the RIGHT middle lobe is favored post infectious or inflammatory scarring. 2. Calcified RIGHT hilar lymph node is consistent post infectious process. 3. Stable small LEFT upper lobe nodule. 4. Stable sclerotic lesion in the manubrium. Sclerotic lesion at T3 is more prominent (4 mm). Sclerotic lesion at L 1 is new from 07/02/2017.    04/06/2018 Imaging    04/06/2018 CT Chest IMPRESSION: 1. Stable linear nodular thickening in the RIGHT middle lobe is favored post infectious or inflammatory scarring. 2. Calcified RIGHT hilar lymph node is consistent post infectious process. 3. Stable small LEFT upper lobe nodule. 4. Stable sclerotic lesion in the manubrium. Sclerotic lesion at T3 is more prominent (4 mm). Sclerotic lesion at L 1 is new from 07/02/2017.    04/10/2018 Echocardiogram    LV EF 45%-50%    05/25/2018 Echocardiogram    LV EF 45%-50%    07/13/2018 Echocardiogram    Normal LV size with EF 55-60%    07/31/2018 Imaging    07/31/2018 Bone Scan IMPRESSION: No evidence of skeletal metastatic disease.    07/31/2018 Imaging    07/31/2018 CT CAP IMPRESSION: Residual linear and nodular peribronchovascular opacities in the right middle lobe, with other scattered areas of ground-glass opacities in the right middle, right upper and right lower lobes, which are located within the previously demonstrated bulky airspace consolidation. The findings are improved from the chest CT dated 12/30/2017, and relatively stable  from PET-CT dated 02/04/2018. These may represent post infectious/post inflammatory changes or residual malignancy.  Stable partially calcified right hilar lymphadenopathy.  Interval development of nodular soft tissue thickening within the anterior mediastinum in the area of the thymus, new from chest CT dated 12/30/2017. This may represent reactive thymic tissue, thymoma or lymphadenopathy.  1.8 cm  probably physiologic left ovarian cyst. Borderline thickening of the endometrium measuring 15.6 mm. Further evaluation with pelvic ultrasound may be considered.  No new or suspicious findings within the abdomen.    09/15/2018 Echocardiogram    EF 55-60%   CURRENT THERAPY:   -Herceptin and Perjeta maintenance therapy every 3 weeks. Held 03/2018-07/17/18 due to low EF.  -Monthly Zoladex injections starting 03/20/18  -Letrozole 2.78m once daily starting 05/03/18, changed to anastrozole in 07/2018   INTERVAL HISTORY: Ms. LCuellarreturns for follow up as scheduled. She completed cycle 11 herceptin/perjeta on 08/27/18. She received zoladex on 09/10/18. She had another echo and f/u with Dr. MAundra Dubinin the interim. Her PCP recently checked thyroid which she reports is in normal range, synthroid maintained at current dose. She has progressive fatigue which limits daily activities and she notes "nothing can hold my interest." She is not physically active due to her fatigue. She feels "a little depressed." on Effexor 225 for a few months now. Which is helping hot flashes. Her sleeping habit is irregular, goes to sleep around 2 am and sleeps until noon, then will nap few times per day. She stopped a[place] it caused daytime drowsiness. Her appetite is fair, drinks well. Has occasional nausea and loose stool but does not require medication. She has mild allergy symptoms, denies fever, chills. Back pain is better on anastrozole. She saw Dr. PGerarda Fractionrecently, BSO is on hold. She has mild intermittent tingling to fingertips of right hand, noticed few months ago. She was previously treated for carpal tunnel in right wrist, but tingling was worse when wearing wrist brace.    MEDICAL HISTORY:  Past Medical History:  Diagnosis Date  . Anxiety   . Cancer ([facility]    right breast   . [place]omyopathy [place]   . Family history of breast cancer   . Family history of colon cancer   . GERD (gastroesophageal reflux disease)    . Headache    couple of migraines in the past  . History of kidney stones   . Vitamin D deficiency     SURGICAL HISTORY: Past Surgical History:  Procedure Laterality Date  . egg donation    . kidney stone removal    . MASTECTOMY W/ SENTINEL NODE BIOPSY Bilateral 07/22/2017   Procedure: RIGHT TOTAL MASTECTOMY WITH RIGHT AXILLARY SENTINEL LYMPH NODE BIOPSY, LEFT PROPHYLACTIC MASTECTOMY;  Surgeon: WRolm Bookbinder MD;  [facility]  Service: General;  Laterality: Bilateral;  . PORTACATH PLACEMENT Right 07/22/2017   Procedure: INSERTION PORT-A-CATH WITH [place]  Surgeon: WRolm Bookbinder MD;  [place] [facility]  Service: General;  Laterality: Right;    I have reviewed the social history and family history with the patient and they are unchanged from previous note.  ALLERGIES:  has No Known Allergies.  MEDICATIONS:  Current Outpatient Medications  Medication Sig Dispense Refill  . anastrozole (ARIMIDEX) 1 MG tablet Take 1 tablet (1 mg total) by mouth daily. 30 tablet 1  . carvedilol (COREG) 3.125 MG tablet TAKE 1 TABLET BY MOUTH 2 TIMES DAILY. 60 tablet 3  . Cholecalciferol (VITAMIN D) 2000 units tablet Take 2,000 Units by mouth daily.    .Marland Kitchen  cyclobenzaprine (FLEXERIL) 5 MG tablet Take 1 tablet (5 mg total) by mouth 2 (two) times daily as needed for muscle spasms. 30 tablet 0  . diphenoxylate-atropine (LOMOTIL) 2.5-0.025 MG tablet Take 1-2 tablets by mouth 4 (four) times daily as needed for diarrhea or loose stools. 30 tablet 0  . levothyroxine (SYNTHROID, LEVOTHROID) 25 MCG tablet Take 25 mcg by mouth daily before breakfast.    . losartan (COZAAR) 25 MG tablet TAKE 1/2 TABLET BY MOUTH AT BEDTIME. 15 tablet 3  . Melatonin 10 MG TABS Take 1 tablet by mouth.     . naproxen sodium (ANAPROX) 220 MG tablet Take 220 mg 2 (two) times daily as needed by mouth (pain).     . Omega-3 Fatty Acids (OMEGA-3 FISH OIL PO) Take 100 Units by mouth.    . venlafaxine XR (EFFEXOR-XR) 150 MG 24 hr capsule  TAKE 1 CAPSULE (150 MG TOTAL) BY MOUTH DAILY WITH BREAKFAST. 30 capsule 3  . venlafaxine XR (EFFEXOR-XR) 75 MG 24 hr capsule TAKE 1 CAPSULE BY MOUTH DAILY WITH BREAKFAST.  2  . zolpidem (AMBIEN) 5 MG tablet Take 1 tablet (5 mg total) by mouth at bedtime as needed for sleep. 30 tablet 1   Current Facility-Administered Medications  Medication Dose Route Frequency Provider Last Rate Last Dose  . gi cocktail (Maalox,Lidocaine,Donnatal)  30 mL Oral Once Harle Stanford., PA-C       Facility-Administered Medications Ordered in Other Visits  Medication Dose Route Frequency Provider Last Rate Last Dose  . diphenhydrAMINE (BENADRYL) capsule 50 mg  50 mg Oral Once [name], MD      . heparin lock flush 100 unit/mL  500 Units Intracatheter Once PRN [name], MD      . pertuzumab (PERJETA) 420 mg in sodium chloride 0.9 % 250 mL chemo infusion  420 mg Intravenous Once [name], MD      . sodium chloride flush (NS) 0.9 % injection 10 mL  10 mL Intracatheter PRN [name], MD      . trastuzumab (HERCEPTIN) 450 mg in sodium chloride 0.9 % 250 mL chemo infusion  450 mg Intravenous Once [name], MD        PHYSICAL EXAMINATION: ECOG PERFORMANCE STATUS: 2 - Symptomatic, <50% confined to bed  Vitals:   09/21/18 1301  BP: 104/63  Pulse: (!) 116  Resp: 16  Temp: 98.2 F (36.8 C)  SpO2: 100%   Filed Weights   09/21/18 1301  Weight: 158 lb 8 oz (71.9 kg)    GENERAL:alert, no distress and comfortable SKIN: no rashes or significant lesions EYES:  sclera clear OROPHARYNX:no thrush or ulcers LYMPH:  no palpable cervical, supraclavicular, or axillary lymphadenopathy LUNGS: clear to auscultation with normal breathing effort HEART: tachycardic, regular rhythm, no lower extremity edema ABDOMEN:abdomen soft, non-tender and normal bowel sounds Musculoskeletal:no cyanosis of digits and no clubbing  NEURO: alert & oriented x 3 with fluent speech, no focal motor/sensory deficits Breast exam s/p bilateral  mastectomy. Incisions are well healed. No palpable masses on chest wall or axilla. Right breast hyperpigmentation is improved.  PAC without erythema    LABORATORY DATA:  I have reviewed the data as listed CBC Latest Ref Rng & Units 09/21/2018 08/27/2018 08/06/2018  WBC 4.0 - 10.5 K/uL 3.8(L) 4.0 2.9(L)  Hemoglobin 12.0 - 15.0 g/dL 11.6(L) 11.9(L) 11.7  Hematocrit 36.0 - 46.0 % 35.0(L) 35.3(L) 34.6(L)  Platelets 150 - 400 K/uL 166 170 180     CMP Latest Ref Rng &  Units 09/21/2018 08/27/2018 08/06/2018  Glucose 70 - 99 mg/dL 148(H) 87 143(H)  BUN 6 - 20 mg/dL '14 11 15  ' Creatinine 0.44 - 1.00 mg/dL 1.17(H) 1.02(H) 1.03(H)  Sodium 135 - 145 mmol/L 135 140 139  Potassium 3.5 - 5.1 mmol/L 4.3 3.9 3.8  Chloride 98 - 111 mmol/L 102 105 107  CO2 22 - 32 mmol/L '24 26 24  ' Calcium 8.9 - 10.3 mg/dL 9.0 9.6 9.4  Total Protein 6.5 - 8.1 g/dL 7.3 7.5 7.4  Total Bilirubin 0.3 - 1.2 mg/dL 0.4 0.4 0.4  Alkaline Phos 38 - 126 U/L 107 119 117  AST 15 - 41 U/L 10(L) 13(L) 12(L)  ALT 0 - 44 U/L '10 11 9      ' RADIOGRAPHIC STUDIES: I have personally reviewed the radiological images as listed and agreed with the findings in the report. No results found.   ASSESSMENT & PLAN: Chakara Bognar is a 42 y.o. premenopausal female with a history of Vitamin D deficiency and high cholesterol and kidney stones, presented with a palpable right breast mass.   1. Malignant neoplasm of upper-outer quadrant of right breast in female, mixed invasive ductal and lobular carcinoma, pT3(m)N2aM0, stage 1b, ductal carcinoma triple positive, lobular carcinoma ER+/PR+/HER2-,  Grade 2 -Final diagnosis from the right simple mastectomy and sentinel lymph node biopsy on 07/22/2017 revealed mixed invasive lobular and ductal carcinoma, grade 2, spanning 5.3 cm. 75% are ductal histology. There is an additional focus of lobular carcinoma, grade 2, spanning 2.2cm and it is ER+/PR+/HER2-. 4 of 10 lymph nodes tested positive for  macrometastases and they are HER2- ductal carcinoma except one was mixed ductal and lobular. Dr. Burr Medico previously discussed her final surgical path with the patient and pathologist Dr. Tresa Moore in detail.   -Her initial biopsy was HER2+ by FISH (ratio 2.55 and copy number 4.85) in her ductal carcinoma. Her lobular carcinoma was HER2 negative.   -She completed adjuvant chemo with TCHP.  We will continue maintenance Herceptin and pejeta to complete 1 year of therapy.  -She initially preferred reconstruction. She saw Dr. Iran Planas but ultimately did not opt for reconstruction.  -Given her ER/PR positive tumor and her young age, she began ovarian suppression and adjuvant antiestrogen therapy with AI. She has started Zoladex on 03/20/2018, tolerating well with hot flashes, improved on increased dose effexor  -Patient completed radiation on 04/09/2018  -due to decreased EF, Herceptin and pejeta was held in 03/2018, restarted in August 2019 when EF improved to 55 to 60%. -due to her worsening back pain, letrozole was changed to anastrozole in 07/2018, back pain is improved  -Repeated bone scan in September 2019 was negative, CT scan showed residual lesion in the right middle lobe, with other scattered areas of groundglass changes, improved, felt to be inflammatory.  No other evidence of recurrence. This was previously reviewed with the patient.  -she underwent repeat echo in 09/05/18, EF 55-60%, GLS -18.2% with normal RV size and systolic function -she continues q3 week herceptin and perjeta, she is tolerating well other than loose BM and fatigue, which appears to be severe. Fatigue is multifactorial. She is not physically active and there is a component of depression. TSH is reportedly normal. Question heart function contribution. She has irregular sleep pattern and we discussed sleep hygiene.  -She has tingling in her right fingertips, unclear if directly related to treatment, no chemotherapy since 11/2017. Possibly  related to carpal tunnel.  -Continue with Zoladex injection monthly, she met with GYN to  discuss BSO which is on hold, continune zoladex monthly. -Labs reviewed, Cr slightly trending up to 1.17, I urged her to remain adequately hydrated. Will monitor. CBC and CMP adequate for treatment today.  -She will get flu vaccine today  -F/u in 3 week with next cycle herceptin/perjeta   2. Genetics -Due to her family history of breast cancer, she agreed to a genetics referral -She was negative for all mutations tested   3. Insomnia  -She has history of insomnia failed multiple agents such as lunesta, trazodone, tylenol pm, and xanax and only had mild relief with Remeron. -She reported that Ativan has alleviated her symptoms thus far and she voiced needing a refill. Refill provided previously  -We previously discussed potentially weaning her off Ativan for sleep, she has mild anxiety due to treatment. She previously was able to ween off Ativan and started Ambien.  -her sleep improved with ambien but left her with daytime drowsiness -now using melatonin only   4. Hot flash  -Her hot flashes are moderate-severe, multiple times a day. She states she was previously on Effexor 82m for depression and had to discontinue due to the Remeron. She is off Remeron.  -She restarted 732mEffexor, prescribed 02/06/18, hot flashes initially improved  -She is now taking 2 tablets of 75 mg Effexor daily with improvement of her hot flashes. She was previously prescribed 150 mg Effexor on 04/10/2018.  -Her hot flashes have worsened lately. Dr. FeBurr Mediconcreased her Effexor to 22542my prescribing 93m89mfexor previously to add to her 150mg70mt flashes are improved on higher dose   5. Bone Health  -I previously briefly discussed previously how an aromatase inhibitor can weaken the bone -She currently taking Vitamin D supplement  -At this time, there is not a need for Vitamin C supplements, however, I previously discussed  with the patient to consume vitamin C rich foods to aid with this.  -We will order a bone density scan in 2020  6. Diffuse Back and neck pain/soreness  -Her CT Chest on 04/06/18 showed sclerotic lesions of spine in T3 and L1.  -her bone scan in 07/2018 was negative  -Letrozole was changed to anastrozole in September 2019; back pain improved   7. Fatigue  -She has progressive fatigue, which is multifactorial.  -recent echo was normal, TSH reportedly in normal range and she is on synthroid. She has very mild anemia -she has components of depression, irregular sleep pattern, and low physical exercise -I strongly encouraged her to increase physical exercise and build up gradually -we reviewed sleep hygiene, I recommend to set a normal bedtime consistently each night and try to limit daytime naps.  -she is off ambien, on melatonin now. Her spouse agrees to do the same, in support  -[place] [facility] [place], [place]   PLAN:  -labs reviewed, proceed with herceptin and perjeta today, continue q3 weeks -zoladex monthly, due in 2 weeks -flu vaccine today -refer to SW for fatigue/depression -Sleep hygiene reviewed  -f/u in 3 weeks with next herceptin/perjeta  -B complex for tingling in right fingertips   All questions were answered. The patient knows to call the clinic with any problems, questions or concerns. No barriers to learning was detected. I spent 20 minutes counseling the patient face to face. The total time spent in the appointment was 25 minutes and more than 50% was on counseling and review of test results  [name], NP 09/21/18

## 2018-09-21 NOTE — Progress Notes (Signed)
Pt declined to stay for 30 min post-Perjeta observation. Ambulated out of clinic without incident 

## 2018-09-22 ENCOUNTER — Encounter: Payer: Self-pay | Admitting: General Practice

## 2018-09-23 ENCOUNTER — Telehealth: Payer: Self-pay | Admitting: General Practice

## 2018-09-23 NOTE — Telephone Encounter (Signed)
[facility] CSW Progress Notes  Referral received from APP, request to reach out to patient to offer resources for depression and adjustment to fatigue/distress associated w cancer diagnosis and treatment.  Called patient, discussed normal reactions to stress of diagnosis and treatment.  Reviewed options for increased support and development of ways to cope w stressors.  Will refer patient to [facility] Counseling Intern for initial support, with possibility of referral to community provider as needed.  Edwyna Shell, LCSW Clinical Social Worker Phone:  [phone]

## 2018-09-26 ENCOUNTER — Other Ambulatory Visit (HOSPITAL_COMMUNITY): Payer: Self-pay | Admitting: Cardiology

## 2018-09-26 ENCOUNTER — Other Ambulatory Visit: Payer: Self-pay | Admitting: Hematology

## 2018-09-30 ENCOUNTER — Other Ambulatory Visit (HOSPITAL_COMMUNITY): Payer: Self-pay | Admitting: Cardiology

## 2018-10-02 NOTE — Progress Notes (Signed)
[facility] Counseling Intake Session  The patient presented to counseling with a calm mood and matching affect. The patient was forthcoming and engaged throughout session.  The patient expressed that a few months ago, she started to feel a loss of interest and fulfillment in her daily life. The patient reported feeling exhausted and depressed. The patient denied any current suicidal ideation. 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 The patient expressed that hard work is important to her, and over the course of her cancer treatment, she lost her job unexpectedly. The patient expressed hurt, frustration, and financial concern toward this situation, and reported that she hopes to begin searching for new jobs in the next few weeks. The patient expressed some fear and uncertainty toward being able to physically manage a full-time job, but appeared hopeful and excited at the prospect of returning to work. The patient shared that she finds her husband, sister, and nephew to be supportive in her life. The patient reported that her husband has also encountered health concerns in the last year, but the patient shared that her relationship with her husband has been strengthened during this time.  The patient expressed that in counseling she hopes to learn about herself, process her experiences, and begin to "not feel guilty about being in disarray" at this point in her cancer experience. The counselor and patient will meet again on November 19 at 2 pm.  Doris Cheadle, Counseling Intern [phone]

## 2018-10-06 ENCOUNTER — Telehealth: Payer: Self-pay | Admitting: Hematology

## 2018-10-06 NOTE — Telephone Encounter (Signed)
Printed medical records on 10/05/18 for patient to pick up, Release [id number]

## 2018-10-06 NOTE — Progress Notes (Signed)
[place]  [phone] [phone]  Clinic Follow Up Note   Patient Care Team: [name], MD as PCP - General (Family Medicine) [name] (Family Medicine) [name], MD as Consulting Physician (General Surgery) [name], MD as Consulting Physician (Hematology) Kyung Rudd, MD as Consulting Physician (Radiation Oncology)   Date of Service:  10/08/2018  CHIEF COMPLAINTS:  Follow up right breast cancer    Oncology History    Cancer Staging Malignant neoplasm of upper-outer quadrant of right breast in female, estrogen receptor positive ([place]) Staging form: Breast, AJCC 8th Edition - Clinical stage from 06/11/2017: Stage IB (cT3, cN0, cM0, G2, ER: Positive, PR: Positive, HER2: Positive) - Signed by [name], MD on 06/16/2017 - Pathologic stage from 07/22/2017: Stage IB (pT3(m), pN2a(sn), cM0, G2, ER: Positive, PR: Positive, HER2: Positive) - Signed by [name], NP on 08/04/2017      Malignant neoplasm of upper-outer quadrant of right breast in female, estrogen receptor positive ([place])   06/11/2017 Initial Biopsy    Diagnosis 06/11/17 Breast, right, needle core biopsy, 11:00 o'clock - INVASIVE DUCTAL CARCINOMA, G2    06/11/2017 Mammogram    [name] and [facility] Diagnostic Breast Tomo Bilateral 06/11/17 IMPRESSION: 1. Highly suspicious mass within the retroareolar right breast, extending from the 8:00 to 11:00 axes, anterior to posterior depth, extending anteriorly to the nipple with associated nipple retraction, measuring at least 5.5 cm by ultrasound  2. No sonographic evidence of metastatic lymphadenopathy in the right axilla. 3. No evidence of malignancy within the left breast.     06/11/2017 Initial Diagnosis    Malignant neoplasm of upper-outer quadrant of right breast in female, estrogen receptor positive ([place])    06/11/2017 Receptors her2    Estrogen Receptor: 100%, POSITIVE, STRONG STAINING INTENSITY Progesterone  Receptor: 100%, POSITIVE, STRONG STAINING INTENSITY Proliferation Marker Ki67: 12%Proliferation Marker Ki67: 12%  HER2 - **POSITIVE** RATIO OF HER2/CEP17 SIGNALS 2.55 AVERAGE HER2 COPY NUMBER PER CELL 4.85    06/25/2017 Genetic Testing    Negative genetic testing on the 9 gene STAT panel.  The STAT Breast cancer panel offered by [facility] includes sequencing and rearrangement analysis for the following 9 genes:  ATM, BRCA1, BRCA2, CDH1, CHEK2, PALB2, PTEN, STK11 and TP53.   The report date is June 25, 2017.  Negative genetic testing on the common hereditary cancer panel.  The Hereditary Gene Panel offered by [facility] includes sequencing and/or deletion duplication testing of the following 46 genes: APC, ATM, AXIN2, BARD1, BMPR1A, BRCA1, BRCA2, BRIP1, CDH1, CDKN2A (p14ARF), CDKN2A (p16INK4a), CHEK2, CTNNA1, DICER1, EPCAM (Deletion/duplication testing only), GREM1 (promoter region deletion/duplication testing only), KIT, MEN1, MLH1, MSH2, MSH3, MSH6, MUTYH, NBN, NF1, NHTL1, PALB2, PDGFRA, PMS2, POLD1, POLE, PTEN, RAD50, RAD51C, RAD51D, SDHB, SDHC, SDHD, SMAD4, SMARCA4. STK11, TP53, TSC1, TSC2, and VHL.  The following genes were evaluated for sequence changes only: SDHA and HOXB13 c.251G>A variant only.  The report date is June 25, 2017.     07/02/2017 Imaging    CT cAP 07/02/17 IMPRESSION: 1. Subareolar right breast mass. No compelling findings of nodal or metastatic involvement. 2. Old granulomatous disease. 3. There is a 7 mm enhancing or hyperdense focus posteriorly in segment 7 of the liver in the subcapsular region. By virtue of its small size this lesion is technically nonspecific although statistically likely to be a small benign lesions such as flash filling hemangioma. This may warrant surveillance. 4.  Prominent stool throughout the colon favors constipation. 5. Degenerative disc disease and  spondylosis at L5-S1 likely causing mild impingement.    07/02/2017 Imaging    Bone scan  07/02/17 IMPRESSION: Today' s exam is negative. I ascribed the tiny focus of activity just proximal to the left antecubital region to injection site.    07/22/2017 Surgery    RIGHT TOTAL MASTECTOMY WITH RIGHT AXILLARY SENTINEL LYMPH NODE BIOPSY, LEFT PROPHYLACTIC MASTECTOM and INSERTION PORT-A-CATH WITH [name] by [name] and [facility]     07/22/2017 Pathology Results    Diagnosis 1. Breast, simple mastectomy, Left - FIBROADENOMA. - NO MALIGNANCY IDENTIFIED. 2. 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 - ADDITIONAL FOCUS OF LOBULAR CARCINOMA, GRADE 2, SPANNING 2.2 CM. - INTERMEDIATE GRADE DUCTAL CARCINOMA IN SITU. - INVASIVE CARCINOMA COMES TO WITHIN 0.2 TO 0.3 CM OF THE DEEP MARGIN, FOCALLY. - TUMOR FOCALLY INVOLVES EPIDERMIS. - LYMPHOVASCULAR INVASION OF DERMAL LYMPHATICS. - ONE OF ONE LYMPH NODES NEGATIVE FOR CARCINOMA (0/1). - SEE ONCOLOGY TABLE. 3. Lymph node, sentinel, biopsy, Right axillary - ONE LYMPH NODE WITH ISOLATED TUMOR CELLS (0/1). 4. Lymph node, sentinel, biopsy, Right - METASTATIC CARCINOMA IN ONE OF ONE LYMPH NODES (1/1). 5. Lymph node, sentinel, biopsy, Right - ONE OF ONE LYMPH NODES NEGATIVE FOR CARCINOMA (0/1). 6. Lymph node, sentinel, biopsy, Right - METASTATIC CARCINOMA IN ONE OF ONE LYMPH NODES (1/1). - EXTRACAPSULAR EXTENSION. 7. Lymph node, sentinel, biopsy, Right - METASTATIC CARCINOMA IN ONE OF ONE LYMPH NODES (1/1). - EXTRACAPSULAR EXTENSION. 8. Lymph node, sentinel, biopsy, Right - ONE OF ONE LYMPH NODES NEGATIVE FOR CARCINOMA (0/1). 9. Lymph node, sentinel, biopsy, Right - ONE OF ONE LYMPH NODES NEGATIVE FOR CARCINOMA (0/1). 10. Lymph node, sentinel, biopsy, Right - METASTATIC CARCINOMA IN ONE OF ONE LYMPH NODES (1/1). - EXTRACAPSULAR EXTENSION. 11. Lymph node, biopsy, Right axillary - ONE OF ONE LYMPH NODES NEGATIVE FOR CARCINOMA (0/1).     07/22/2017 Receptors her2    Ductal  carcinoma in primary breast tumor and node metastasis are ER 95%, PR 95% strongly positive, and HER2 + The lobular component in primary breast tumor is ER 95% positive, PR 95% positive, strong staining, HER-2 negative.   Original biopsy [id number]: Ductal morphology, Her2 FISH positive. Current specimen: Main focus is 75% ductal morphology, 25% lobular morphology. Smaller focus is lobular morphology. #2A Lobular morphology, Her IHC negative. #2E Lobular morphology, Her2 FISH negative, Her2 IHC negative. #4 50% lobular and 50% ductal morphology, Her2 FISH positive, Her2 IHC negative. #6 Ductal morphology #7 Ductal morphology #10 50% lobular, 50% ductal morphology    08/25/2017 Imaging    MRI abdomen done at [facility]:  Enhancing structure measing 5 mm is compatible with a flash filling hemangioma. No specific findings to suggest metastatic cancer    08/25/2017 Imaging    MRI Abdomen at [facility] 08/25/17  IMPRESSION: 1. Lesion in segment 7 of the liver has signal and enhancement characteristics compatible with a flash filling hemangioma. No specific findings identified to suggest metastatic disease.    08/29/2017 - 12/12/2017 Chemotherapy    adjuvant TCHP every 3 weeks for 6 cycles starting 08/29/17, followed by maintenance Herceptin with or without Perjeta for 6-12 months. Postponed and decreased Carbo to 636m and added Onpro with cycle 2 on 09/19/17 due to neutropenic fever. Carbplatin further reduced to 5047mstarting with cycle 3 due to thrombocytopenia. Herceptin was held for Cycle 4 and 5 due to worsening global longitudinal strain found on ECHO from 11/07/17.        09/04/2017 -  09/09/2017 Hospital Admission    Admit date: 09/04/2017 Discharge date: 09/09/2017  DISCHARGE DIAGNOSES:  Principal Problem:   Sepsis [facility]) Active Problems:   Malignant neoplasm of upper-outer quadrant of right breast in female, estrogen receptor positive ([place])   Port-A-Cath in place   Neutropenic fever  ([place])   Hyponatremia   Malignant neoplasm of breast in female, estrogen receptor positive ([place])     12/28/2017 Imaging    CT CHEST IMPRESSION: 1. 2.3 x 2.7 x 1.2 cm irregular masslike opacity within the right middle lobe worrisome for malignancy/metastasis. 2. 3 new very small sclerotic lesions, within the sternum and the body of T4, worrisome for new metastases.    12/28/2017 - 01/05/2018 Hospital Admission    Admit date: 12/28/17-01/05/18 Admission diagnosis: multifocal pneumonia, influenza A, sepsis, hypoxia Additional comments: imaging during hospitalization notable for new irregular masslike opacity in the right middle lobe worrisome for metastasis and new very small sclerotic lesion within the sternum and the body of T4, worrisome for metastasis    12/29/2017 Imaging    CT ABD/PELVIS IMPRESSION: No definite acute intra-abdominal or intrapelvic abnormalities.  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    12/30/2017 Imaging    CT HEAD IMPRESSION: No acute intracranial abnormality or enhancing lesion.    02/04/2018 PET scan    IMPRESSION: 1. The previous consolidation in the right middle lobe currently has a more nodular and reticular appearance with some associated mild atelectasis along its anterior margin. This demonstrates low-grade metabolic activity with maximum SUV 3.2 (background blood pool activity is 2.7). Although conceivably related to low-grade residuum from the patient's malignancy, the presence of calcified right hilar lymph nodes and small calcifications along the nodularity raise suspicion for a low-grade active granulomatous process or atypical infectious process. Surveillance is recommended. 2. Scattered deposits of hypermetabolic but benign metabolically active brown fat.    03/20/2018 -  Anti-estrogen oral therapy    -Monthly Zoladex injection starting 03/20/18  -Letrozole 2.68m once dialy starting 05/03/18  -Anastrozole  started 08/2018    04/06/2018 Imaging    CT Chest wo contrast IMPRESSION: 1. Stable linear nodular thickening in the RIGHT middle lobe is favored post infectious or inflammatory scarring. 2. Calcified RIGHT hilar lymph node is consistent post infectious process. 3. Stable small LEFT upper lobe nodule. 4. Stable sclerotic lesion in the manubrium. Sclerotic lesion at T3 is more prominent (4 mm). Sclerotic lesion at L 1 is new from 07/02/2017.    04/06/2018 Imaging    04/06/2018 CT Chest IMPRESSION: 1. Stable linear nodular thickening in the RIGHT middle lobe is favored post infectious or inflammatory scarring. 2. Calcified RIGHT hilar lymph node is consistent post infectious process. 3. Stable small LEFT upper lobe nodule. 4. Stable sclerotic lesion in the manubrium. Sclerotic lesion at T3 is more prominent (4 mm). Sclerotic lesion at L 1 is new from 07/02/2017.    04/10/2018 Echocardiogram    LV EF 45%-50%    05/25/2018 Echocardiogram    LV EF 45%-50%    07/13/2018 Echocardiogram    Normal LV size with EF 55-60%    07/31/2018 Imaging    07/31/2018 Bone Scan IMPRESSION: No evidence of skeletal metastatic disease.    07/31/2018 Imaging    07/31/2018 CT CAP IMPRESSION: Residual linear and nodular peribronchovascular opacities in the right middle lobe, with other scattered areas of ground-glass opacities in the right middle, right upper and right lower lobes, which are located within the previously demonstrated bulky airspace  consolidation. The findings are improved from the chest CT dated 12/30/2017, and relatively stable from PET-CT dated 02/04/2018. These may represent post infectious/post inflammatory changes or residual malignancy.  Stable partially calcified right hilar lymphadenopathy.  Interval development of nodular soft tissue thickening within the anterior mediastinum in the area of the thymus, new from chest CT dated 12/30/2017. This may represent reactive thymic  tissue, thymoma or lymphadenopathy.  1.8 cm probably physiologic left ovarian cyst. Borderline thickening of the endometrium measuring 15.6 mm. Further evaluation with pelvic ultrasound may be considered.  No new or suspicious findings within the abdomen.    09/15/2018 Echocardiogram    EF 55-60%    HISTORY OF PRESENTING ILLNESS: 4/0/10 Suan Halter 42 y.o. female is here because of newly diagnosed Malignant neoplasm of upper-outer quadrant of right breast. She presents to the breast clinic today with her common law husband. She felt the lump initially when she had her physical 2 weeks ago, she had felt her nipples retracted several years ago. A few months ago it has become more pronounced. She had no pain or discharge.   In the past was diagnosed with Vitamin D deficiency and high cholesterol. She previously was on mood stabilizing medication. She feel right now she does not need that medication but is overwhelmed. She donated eggs and previously had kidney stones removed. Her mother had breast cancer and was diagnosed at 83 along with paternal grandmother in her 71's.   Today she has heart burn which she is taking Prilosec and will see her GI later today. She has trouble sleeping and uses tylenol pm. She tried [place] which did not help, same with melatonin. Her common law husband Is on disability.  GYN HISTORY  Menarchal: 19 LMP: July 23rd, 2018 Contraceptive: 20-27 on birth control pill HRT: NA G0P0: does not want kids  CURRENT THERAPY:   -Herceptin and Perjeta maintenance therapy every 3 weeks. Held 03/2018-07/17/18 due to low EF.  -Monthly Zoladex injections starting 03/20/18  -Letrozole 2.50m once daily starting 05/03/18, changed to anastrozole in 07/2018    INTERVAL HISTORY:  AAyasha Ellingsenis here for a follow up. She saw Np Lacie in the interim and was noted to have progressive fatigue.  Today, she is here with her husband. She plans to eventually go back to work.  She's been feeling tired lately, but tries to stay active at home and do household activities. She also tries to go out of the house for a few hours. She tries to keep her naps under an hour. She also reports body aches, especially in her upper back. She has seen PT before for her carpal tunnel, but she still complains of tingling and numbness in her fingers that she tries to relief with Vitamin B complex as advised by NP Lacie according to the patient. She follows up with cardiology and states that she still feels dizzy sometimes.  She is tolerating Anastrozole well with no hot flashes and mild joint pain. She is scheduled to see her OB/GYN soon.  She no longer takes Ambien for sleep and states that she uses melatonin instead.    MEDICAL HISTORY:  Past Medical History:  Diagnosis Date   Anxiety    Cancer ([facility]    right breast    [place]    Family history of breast cancer    Family history of colon cancer    GERD (gastroesophageal reflux disease)    Headache    couple of migraines in the  past   History of kidney stones    Vitamin D deficiency    SURGICAL HISTORY: Past Surgical History:  Procedure Laterality Date   egg donation     kidney stone removal     MASTECTOMY W/ SENTINEL NODE BIOPSY Bilateral 07/22/2017   Procedure: RIGHT TOTAL MASTECTOMY WITH RIGHT AXILLARY SENTINEL LYMPH NODE BIOPSY, LEFT PROPHYLACTIC MASTECTOMY;  Surgeon: [name], MD;  Location: [place];  Service: General;  Laterality: Bilateral;   PORTACATH PLACEMENT Right 07/22/2017   Procedure: [place] WITH [name];  Surgeon: [name], MD;  Location: [place];  Service: General;  Laterality: Right;   SOCIAL HISTORY: Social History   Socioeconomic History   Marital status: Single    Spouse name: Not on file   Number of children: Not on file   Years of education: Not on file   Highest education level: Not on file  Occupational History   Not on file  Social Needs     Financial resource strain: Not on file   Food insecurity:    Worry: Not on file    Inability: Not on file   Transportation needs:    Medical: Not on file    Non-medical: Not on file  Tobacco Use   Smoking status: Never Smoker   Smokeless tobacco: Former Systems developer  Substance and Sexual Activity   Alcohol use: Yes    Comment: once a year   Drug use: No   Sexual activity: Yes  Lifestyle   Physical activity:    Days per week: Not on file    Minutes per session: Not on file   Stress: Not on file  Relationships   Social connections:    Talks on phone: Not on file    Gets together: Not on file    Attends religious service: Not on file    Active member of club or organization: Not on file    Attends meetings of clubs or organizations: Not on file    Relationship status: Not on file   Intimate partner violence:    Fear of current or ex partner: Not on file    Emotionally abused: Not on file    Physically abused: Not on file    Forced sexual activity: Not on file  Other Topics Concern   Not on file  Social History Narrative   Not on file   FAMILY HISTORY: Family History  Problem Relation Age of Onset   Breast cancer Paternal Grandmother 84   Colon cancer Paternal Grandmother 56   Breast cancer Mother 65   Osteoporosis Mother    Prostate cancer Paternal Grandfather        dx in his 51s   Leukemia Paternal Grandfather    Lung cancer Maternal Uncle        heavy smoker   Other Paternal Uncle        farm accident   Stroke Maternal Grandfather    ALLERGIES:  has No Known Allergies.  MEDICATIONS:  Current Outpatient Medications  Medication Sig Dispense Refill   anastrozole (ARIMIDEX) 1 MG tablet Take 1 tablet (1 mg total) by mouth daily. 90 tablet 2   B Complex-C (B-COMPLEX WITH VITAMIN C) tablet Take 1 tablet by mouth daily.     carvedilol (COREG) 3.125 MG tablet TAKE 1 TABLET BY MOUTH 2 TIMES DAILY. 60 tablet 5   Cholecalciferol (VITAMIN D) 2000  units tablet Take 2,000 Units by mouth daily.     cyclobenzaprine (FLEXERIL) 5 MG tablet Take 1  tablet (5 mg total) by mouth 2 (two) times daily as needed for muscle spasms. 30 tablet 0   diphenoxylate-atropine (LOMOTIL) 2.5-0.025 MG tablet Take 1-2 tablets by mouth 4 (four) times daily as needed for diarrhea or loose stools. 30 tablet 0   levothyroxine (SYNTHROID, LEVOTHROID) 25 MCG tablet Take 25 mcg by mouth daily before breakfast.     losartan (COZAAR) 25 MG tablet TAKE 1/2 TABLET BY MOUTH AT BEDTIME. 15 tablet 5   Melatonin 10 MG TABS Take 1 tablet by mouth.      naproxen sodium (ANAPROX) 220 MG tablet Take 220 mg 2 (two) times daily as needed by mouth (pain).      Omega-3 Fatty Acids (OMEGA-3 FISH OIL PO) Take 100 Units by mouth.     venlafaxine XR (EFFEXOR-XR) 150 MG 24 hr capsule TAKE 1 CAPSULE (150 MG TOTAL) BY MOUTH DAILY WITH BREAKFAST. 90 capsule 2   venlafaxine XR (EFFEXOR-XR) 75 MG 24 hr capsule TAKE 1 CAPSULE BY MOUTH DAILY WITH BREAKFAST.  2   zolpidem (AMBIEN) 5 MG tablet Take 1 tablet (5 mg total) by mouth at bedtime as needed for sleep. 30 tablet 1   Current Facility-Administered Medications  Medication Dose Route Frequency Provider Last Rate Last Dose   gi cocktail (Maalox,Lidocaine,Donnatal)  30 mL Oral Once Harle Stanford., PA-C       REVIEW OF SYSTEMS: Constitutional: Denies fevers, night sweats or weight changes Eyes: Denies blurriness of vision, double vision or watery eyes Ears, nose, mouth, throat, and face: Denies mucositis or sore throat  Respiratory: Denies cough, dyspnea or wheezes Cardiovascular: Denies palpitation, chest discomfort or lower extremity swelling Gastrointestinal:  Denies nausea, heartburn or change in bowel habits   Skin: Denies abnormal skin rashes MSK: (+) generalized body aches, especially in upper back and neck  (+) carpal tunnel causing numbness and tingling in fingers  Lymphatics: Denies new lymphadenopathy or easy  bruising Neurological:Denies numbness, tingling or new weaknesses Behavioral/Psych: Mood is stable, no new changes  Breast: (+) bilateral mastectomy on 07/22/17 All other systems were reviewed with the patient and are negative.  PHYSICAL EXAMINATION:  ECOG PERFORMANCE STATUS: 2  Vitals:   10/08/18 0841  BP: 98/68  Pulse: (!) 106  Resp: 17  Temp: 98.5 F (36.9 C)  SpO2: 99%   Filed Weights   10/08/18 0841  Weight: 163 lb 14.4 oz (74.3 kg)      GENERAL:alert, no distress and comfortable SKIN: skin color, texture, turgor are normal, no rashes or significant lesions EYES: normal, conjunctiva are pink and non-injected, sclera clear OROPHARYNX:no exudate, no erythema and lips, buccal mucosa, and tongue normal  NECK: supple, thyroid normal size, non-tender, without nodularity LYMPH:  no palpable lymphadenopathy in the cervical, axillary or inguinal LUNGS: clear to auscultation and percussion with normal breathing effort HEART: regular rate & rhythm and no murmurs and no lower extremity edema ABDOMEN:abdomen soft, non-tender and normal bowel sounds Musculoskeletal:no cyanosis of digits and no clubbing  PSYCH: alert & oriented x 3 with fluent speech NEURO: no focal motor/sensory deficits Breast: (+) bilateral mastectomy, incisions clean and intact, healed well. Mild numbness around incision. No ulcers or discharge.  No palpable chest wall mass or adenopathy.  LABORATORY DATA:  I have reviewed the data as listed CBC Latest Ref Rng & Units 10/08/2018 09/21/2018 08/27/2018  WBC 4.0 - 10.5 K/uL 3.8(L) 3.8(L) 4.0  Hemoglobin 12.0 - 15.0 g/dL 11.0(L) 11.6(L) 11.9(L)  Hematocrit 36.0 - 46.0 % 33.6(L) 35.0(L) 35.3(L)  Platelets 150 -  400 K/uL 156 166 170   CMP Latest Ref Rng & Units 10/08/2018 09/21/2018 08/27/2018  Glucose 70 - 99 mg/dL 131(H) 148(H) 87  BUN 6 - 20 mg/dL _0 Creatinine 0.44 - 1.00 mg/dL 1.00 1.17(H) 1.02(H)  Sodium 135 - 145 mmol/L 140 135 140  Potassium 3.5 - 5.1  mmol/L 3.6 4.3 3.9  Chloride 98 - 111 mmol/L 106 102 105  CO2 22 - 32 mmol/L _1 Calcium 8.9 - 10.3 mg/dL 9.0 9.0 9.6  Total Protein 6.5 - 8.1 g/dL 6.9 7.3 7.5  Total Bilirubin 0.3 - 1.2 mg/dL 0.3 0.4 0.4  Alkaline Phos 38 - 126 U/L 99 107 119  AST 15 - 41 U/L 15 10(L) 13(L)  ALT 0 - 44 U/L _2 Tumor Marker CA 27.29 02/27/18: 11.9 05/29/18: 8.9 08/27/18: 9.4   PATHOLOGY  Diagnosis 07/22/17 1. Breast, simple mastectomy, Left - FIBROADENOMA. - NO MALIGNANCY IDENTIFIED. 2. 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 - ADDITIONAL FOCUS OF LOBULAR CARCINOMA, GRADE 2, SPANNING 2.2 CM. - INTERMEDIATE GRADE DUCTAL CARCINOMA IN SITU. - INVASIVE CARCINOMA COMES TO WITHIN 0.2 TO 0.3 CM OF THE DEEP MARGIN, FOCALLY. - TUMOR FOCALLY INVOLVES EPIDERMIS. - LYMPHOVASCULAR INVASION OF DERMAL LYMPHATICS. - ONE OF ONE LYMPH NODES NEGATIVE FOR CARCINOMA (0/1). - SEE ONCOLOGY TABLE. 3. Lymph node, sentinel, biopsy, Right axillary - ONE LYMPH NODE WITH ISOLATED TUMOR CELLS (0/1). 4. Lymph node, sentinel, biopsy, Right - METASTATIC CARCINOMA IN ONE OF ONE LYMPH NODES (1/1). 5. Lymph node, sentinel, biopsy, Right - ONE OF ONE LYMPH NODES NEGATIVE FOR CARCINOMA (0/1). 6. Lymph node, sentinel, biopsy, Right - METASTATIC CARCINOMA IN ONE OF ONE LYMPH NODES (1/1). - EXTRACAPSULAR EXTENSION. 7. Lymph node, sentinel, biopsy, Right - METASTATIC CARCINOMA IN ONE OF ONE LYMPH NODES (1/1). - EXTRACAPSULAR EXTENSION. 8. Lymph node, sentinel, biopsy, Right - ONE OF ONE LYMPH NODES NEGATIVE FOR CARCINOMA (0/1). 9. Lymph node, sentinel, biopsy, Right - ONE OF ONE LYMPH NODES NEGATIVE FOR CARCINOMA (0/1). 10. Lymph node, sentinel, biopsy, Right - METASTATIC CARCINOMA IN ONE OF ONE LYMPH NODES (1/1). - EXTRACAPSULAR EXTENSION. 11. Lymph node, biopsy, Right axillary - ONE OF ONE LYMPH NODES NEGATIVE FOR CARCINOMA (0/1). Microscopic  Comment 2. BREAST, INVASIVE TUMOR Microscopic Comment(continued) Procedure: Bilateral simple mastectomies with right axillary sentinel lymph node biopsies. Laterality: Right. Tumor Size: 5.3 cm. Histologic Type: Mixed lobular and ductal carcinoma. Grade: 2 Tubular Differentiation: 2 Nuclear Pleomorphism: 3 Mitotic Count: 1 Ductal Carcinoma in Situ (DCIS): Present, intermediate grade. Extent of Tumor: Involves epidermis of nipple focally. Margins: Invasive carcinoma, distance from closest margin: 0.2-0.3 cm of posterior margin (smaller mass). DCIS, distance from closest margin: >0.5 cm all margins Regional Lymph Nodes: Number of Lymph Nodes Examined: 10 Number of Sentinel Lymph Nodes Examined: 9 Lymph Nodes with Macrometastases: 4 Lymph Nodes with Micrometastases: 0 Lymph Nodes with Isolated Tumor Cells: 1 Breast Prognostic Profile: Performed on biopsy ([id number]), see below. Will be performed on additional lobular focus. Estrogen Receptor: Positive, 100% strong staining. Progesterone Receptor: Positive, 100% strong staining. Her2: Positive (ratio 2.55). Ki-67: 12%. Best tumor block for sendout testing: 2B (larger focus), 2E (smaller focus). Pathologic Stage Classification (pTNM, AJCC 8th Edition): Primary Tumor (pT): mpT3 Regional Lymph Nodes (pN): pN2a Distant Metastases (pM): pMX  2. FLUORESCENCE IN-SITU HYBRIDIZATION Results: HER2 - NEGATIVE RATIO OF HER2/CEP17 SIGNALS 1.71 AVERAGE HER2 COPY NUMBER PER CELL 1.80 2. PROGNOSTIC INDICATORS  Results: IMMUNOHISTOCHEMICAL AND MORPHOMETRIC ANALYSIS PERFORMED MANUALLY Estrogen Receptor: 95%, POSITIVE, STRONG STAINING INTENSITY Progesterone Receptor: 95%, POSITIVE, STRONG STAINING INTENSITY Proliferation Marker Ki67: 10%  4. FLUORESCENCE IN-SITU HYBRIDIZATION Results: HER2 - **POSITIVE**. OF NOTE, A TOTAL OF 40 TUMOR CELLS WERE EVALUATED FOR HER2 EXPRESSION. RATIO OF HER2/CEP17 SIGNALS 2.01 AVERAGE HER2 COPY NUMBER PER  CELL 3.63  Estrogen Receptor: 95%, POSITIVE, STRONG STAINING INTENSITY Progesterone Receptor: 95%, POSITIVE, STRONG STAINING INTENSITY  Addendum 1. By immunohistochemistry, the tumor cells in part 2 are Negative for Her2 (1+).  2. The current prognostic profile was performed on the additional focus of pure lobular carcinoma.  Diagnosis 06/11/17 Breast, right, needle core biopsy, 11:00 o'clock - INVASIVE DUCTAL CARCINOMA, SEE COMMENT. Microscopic Comment The carcinoma appears grade 2. Prognostic markers will be ordered. Dr. Lyndon Code has reviewed the case. The case was called to [facility] on 06/12/2017. Results: HER2 - **POSITIVE** RATIO OF HER2/CEP17 SIGNALS 2.55 AVERAGE HER2 COPY NUMBER PER CELL 4.85    PROCEDURES  09/15/2018 ECHO LV EF: 55% -   60%  ECHO 07/13/18  Impressions: - Normal LV size with EF 55-60%. Strain as above. Normal RV size   and systolic function. No significant valvular abnormalities.  ECHO expected 08/12/17  RADIOGRAPHIC STUDIES: I have personally reviewed the radiological images as listed and agreed with the findings in the report. No results found.   07/31/2018 Bone Scan IMPRESSION: No evidence of skeletal metastatic disease.   07/31/2018 CT CAP IMPRESSION: Residual linear and nodular peribronchovascular opacities in the right middle lobe, with other scattered areas of ground-glass opacities in the right middle, right upper and right lower lobes, which are located within the previously demonstrated bulky airspace consolidation. The findings are improved from the chest CT dated 12/30/2017, and relatively stable from PET-CT dated 02/04/2018. These may represent post infectious/post inflammatory changes or residual malignancy.  Stable partially calcified right hilar lymphadenopathy.  Interval development of nodular soft tissue thickening within the anterior mediastinum in the area of the thymus, new from chest CT dated  12/30/2017. This may represent reactive thymic tissue, thymoma or lymphadenopathy.  1.8 cm probably physiologic left ovarian cyst. Borderline thickening of the endometrium measuring 15.6 mm. Further evaluation with pelvic ultrasound may be considered.  No new or suspicious findings within the abdomen.  04/06/2018 CT Chest IMPRESSION: 1. Stable linear nodular thickening in the RIGHT middle lobe is favored post infectious or inflammatory scarring. 2. Calcified RIGHT hilar lymph node is consistent post infectious process. 3. Stable small LEFT upper lobe nodule. 4. Stable sclerotic lesion in the manubrium. Sclerotic lesion at T3 is more prominent (4 mm). Sclerotic lesion at L 1 is new from 07/02/2017.  PET Scan 02/04/18  IMPRESSION: 1. The previous consolidation in the right middle lobe currently has a more nodular and reticular appearance with some associated mild atelectasis along its anterior margin. This demonstrates low-grade metabolic activity with maximum SUV 3.2 (background blood pool activity is 2.7). Although conceivably related to low-grade residuum from the patient's malignancy, the presence of calcified right hilar lymph nodes and small calcifications along the nodularity raise suspicion for a low-grade active granulomatous process or atypical infectious process. Surveillance is recommended. 2. Scattered deposits of hypermetabolic but benign metabolically active brown fat.  CT AP W Contrast 12/29/17 IMPRESSION: No definite acute intra-abdominal or intrapelvic abnormalities. 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  CT Chest W Contrast 12/28/17 IMPRESSION: 1. 2.3 x 2.7 x 1.2 cm irregular masslike opacity within  the right middle lobe worrisome for malignancy/metastasis. 2. 3 new very small sclerotic lesions, within the sternum and the body of T4, worrisome for new metastases.  CT AP W Contrast 09/05/17 IMPRESSION: Stable 7 mm  hyperdensity seen in right hepatic lobe most consistent with hemangioma based on recent MRI. 2.4 cm right ovarian involuting cyst is noted. No other abnormality seen in the abdomen or pelvis.  MRI Abdomen at [facility] 08/25/17  IMPRESSION: 1. Lesion in segment 7 of the liver has signal and enhancement characteristics compatible with a flash filling hemangioma. No specific findings identified to suggest metastatic disease.  ASSESSMENT & PLAN:  Sheila [name] is a 42 y.o. premenopausal female with a history of Vitamin D deficiency and high cholesterol and kidney stones, presented with a palpable right breast mass.   1. Malignant neoplasm of upper-outer quadrant of right breast in female, mixed invasive ductal and lobular carcinoma, pT3(m)N2aM0, stage 1b, ductal carcinoma triple positive, lobular carcinoma ER+/PR+/HER2-,  Grade 2 -She underwent staging CT and bone scan on 07/02/2017 prior to surgery, both were negative for metastasis. -There was a 70m enhancing or hyperdense focus posteriorly in the liver which is technically nonspecific, and her abdominal MRI showed this lesion is likely hemangioma. -I previously reviewed the surgical pathology with the patient and her husband in detail.  -Final diagnosis from the right simple mastectomy and sentinel lymph node biopsy on 07/22/2017 revealed mixed invasive lobular and ductal carcinoma, grade 2, spanning 5.3 cm. 75% are ductal histology. There is an additional focus of lobular carcinoma, grade 2, spanning 2.2cm and it is ER+/PR+/HER2-. 4 of 10 lymph nodes tested positive for macrometastases and they are HER2- ductal carcinoma except one was mixed ductal and lobular. I have discussed her final surgical path wit pathologist Dr. MTresa Moorein details.   -Her initial biopsy was HER2+ by FISH (ratio 2.55 and copy number 4.85) in her ductal carcinoma. Her lobular were HER2 negative.   -She completed adjuvant chemo with TCHP.  We will continue maintenance Herceptin  and pejeta to complete 1 year of therapy.  -She initially preferred reconstruction. She saw Dr. TIran Planasbut ultimately did not opt for reconstruction.  -Given her ER/PR positive tumor and her young age, I recommend ovarian suppression and adjuvant antiestrogen therapy with AI. She has started Zoladex on 03/20/2018, tolerating well  --Patient completed radiation on 04/09/2018  -due to decreased EF, Herceptin and pejeta was held in 03/2018, restarted in August 2019 when EF improved to 55 to 60%. -She started adjuvant Letrozole on 05/03/18. Due to her worsening back pain, letrozole was changed to anastrozole, which she is tolerating better  -Repeated bone scan in September 2019 was negative, CT scan showed residual lesion in the right middle lobe, with other scattered areas of groundglass changes, improved, fever to be inflammatory.  No other evidence of recurrence. -Labs reviewed, WBC at 3.8, Hg at 11.0. I will monitor her counts. CMP pending.  -Continue with Zoladex injection monthly, she has appointment with GYN to discuss BSO in December. -F/u in 1/24   2. Genetics -Due to her family history of breast cancer, she agreed to a genetics referral -She was negative for all mutations tested   3. Insomnia  -She has history of insomnia failed multiple agents such as lunesta, trazodone, tylenol pm, and xanax and only had mild relief with Remeron. -She reported that Ativan has alleviated her symptoms thus far and she voiced needing a refill. Refill provided previously  -We previously discussed potentially weaning her  off Ativan for sleep, she has mild anxiety due to treatment. She previously was able to ween off Ativan and started Ambien.  -Patient notes that her sleep has improved with Ambien.  -She was weaned off Ambien is now on high dose melatonin.  -She currently takes Melatonin to help her sleep, is not very effective.  She still has significant insomnia, only sleeps a few hours at night.  He  previously tried multiple medications, and Ambien works the best for her.  4. Hot flash  -Her hot flashes are moderate-severe, multiple times a day. She states she was previously on Effexor 22m for depression and had to discontinue due to the Remeron. She is off Remeron right not.  -She restarted 737mEffexor, prescribed 02/06/18 -much improved on Effexor   -I increased her dose of Effexor to 225 mg daily, her hot flash has improved  5. Bone Health  -I previously briefly discussed previously how an aromatase inhibitor can weaken the bone -She currently taking Vitamin D supplement  -At this time, there is not a need for Vitamin C supplements, however, I previously discussed with the patient to consume vitamin C rich foods to aid with this.  -We will order a bone density scan in 2020  6. Diffuse Back and neck pain/soreness  -Her CT Chest on 04/06/18 showed sclerotic lesions of spine in T3 and L1.  -her bone scan in 07/2018 was negative  -Letrozole was changed to anastrozole in September 2019  PLAN:  -Recent echo in the lab reviewed, adequate for treatment, will proceed Herceptin and pejeta today, and continue every 3 weeks -Continue anastrozole, refill today -Continue Zoladex injection every months, next injection next week -I will see her back in 3 weeks, then every 6 weeks after    All questions were answered. The patient knows to call the clinic with any problems, questions or concerns.  I spent 25 minutes counseling the patient face to face. The total time spent in the appointment was 30 minutes and more than 50% was on counseling.  I,Dierdre Searlesweik am acting as scribe for Dr. YaTruitt Merle I have reviewed the above documentation for accuracy and completeness, and I agree with the above.    YaTruitt Merle11/21/2019

## 2018-10-06 NOTE — Progress Notes (Signed)
[place] Counseling Session  The patient presented to session with a calm mood and matching affect.   The patient shared that she has been feeling some relational stress in the last week related to trying to "make everybody happy" in her family. The patient expressed feeling disappointed when some of her family members have not been understanding of her and one another recently. The patient reported not feeling understood by her mom and other adults in some social settings, due to differences in values. The patient expressed that she would like to find more ways to fill her time during the day while she is not working, and potentially classes in the community related to [employer], cross stitch, and Spanish may interest her due to adding structure through activities she enjoys.  The patient and counselor processed the patient's relational concerns, and the counselor validated the pressure related to being the family "peacemaker." The patient and counselor also discussed the patient's values and how these show up in her life. The counselor broached differences in values. The patient reported feeling like she gained some insight into "who I am" from this session. The patient and counselor will meet again on November 26 at 2 pm.   Doris Cheadle, Counseling Intern [phone]

## 2018-10-08 ENCOUNTER — Inpatient Hospital Stay: Payer: BLUE CROSS/BLUE SHIELD

## 2018-10-08 ENCOUNTER — Other Ambulatory Visit: Payer: Self-pay | Admitting: Nurse Practitioner

## 2018-10-08 ENCOUNTER — Telehealth: Payer: Self-pay | Admitting: Hematology

## 2018-10-08 ENCOUNTER — Encounter: Payer: Self-pay | Admitting: Hematology

## 2018-10-08 ENCOUNTER — Inpatient Hospital Stay (HOSPITAL_BASED_OUTPATIENT_CLINIC_OR_DEPARTMENT_OTHER): Payer: BLUE CROSS/BLUE SHIELD | Admitting: Hematology

## 2018-10-08 VITALS — BP 98/68 | HR 106 | Temp 98.5°F | Resp 17 | Ht 66.0 in | Wt 163.9 lb

## 2018-10-08 DIAGNOSIS — D649 Anemia, unspecified: Secondary | ICD-10-CM | POA: Diagnosis not present

## 2018-10-08 DIAGNOSIS — R232 Flushing: Secondary | ICD-10-CM | POA: Diagnosis not present

## 2018-10-08 DIAGNOSIS — Z170 Estrogen receptor positive status [ER+]: Secondary | ICD-10-CM | POA: Diagnosis not present

## 2018-10-08 DIAGNOSIS — N951 Menopausal and female climacteric states: Secondary | ICD-10-CM | POA: Diagnosis not present

## 2018-10-08 DIAGNOSIS — G4700 Insomnia, unspecified: Secondary | ICD-10-CM

## 2018-10-08 DIAGNOSIS — C50411 Malignant neoplasm of upper-outer quadrant of right female breast: Secondary | ICD-10-CM | POA: Diagnosis not present

## 2018-10-08 DIAGNOSIS — M5489 Other dorsalgia: Secondary | ICD-10-CM

## 2018-10-08 DIAGNOSIS — Z79811 Long term (current) use of aromatase inhibitors: Secondary | ICD-10-CM

## 2018-10-08 DIAGNOSIS — Z95828 Presence of other vascular implants and grafts: Secondary | ICD-10-CM

## 2018-10-08 DIAGNOSIS — R110 Nausea: Secondary | ICD-10-CM | POA: Diagnosis not present

## 2018-10-08 DIAGNOSIS — Z79899 Other long term (current) drug therapy: Secondary | ICD-10-CM

## 2018-10-08 DIAGNOSIS — F418 Other specified anxiety disorders: Secondary | ICD-10-CM | POA: Diagnosis not present

## 2018-10-08 DIAGNOSIS — C773 Secondary and unspecified malignant neoplasm of axilla and upper limb lymph nodes: Secondary | ICD-10-CM

## 2018-10-08 DIAGNOSIS — Z23 Encounter for immunization: Secondary | ICD-10-CM | POA: Diagnosis not present

## 2018-10-08 DIAGNOSIS — Z9221 Personal history of antineoplastic chemotherapy: Secondary | ICD-10-CM | POA: Diagnosis not present

## 2018-10-08 DIAGNOSIS — Z5112 Encounter for antineoplastic immunotherapy: Secondary | ICD-10-CM | POA: Diagnosis not present

## 2018-10-08 DIAGNOSIS — Z9013 Acquired absence of bilateral breasts and nipples: Secondary | ICD-10-CM | POA: Diagnosis not present

## 2018-10-08 DIAGNOSIS — R202 Paresthesia of skin: Secondary | ICD-10-CM | POA: Diagnosis not present

## 2018-10-08 DIAGNOSIS — R197 Diarrhea, unspecified: Secondary | ICD-10-CM | POA: Diagnosis not present

## 2018-10-08 DIAGNOSIS — Z803 Family history of malignant neoplasm of breast: Secondary | ICD-10-CM

## 2018-10-08 DIAGNOSIS — R5383 Other fatigue: Secondary | ICD-10-CM | POA: Diagnosis not present

## 2018-10-08 LAB — CBC WITH DIFFERENTIAL/PLATELET
Abs Immature Granulocytes: 0 10*3/uL (ref 0.00–0.07)
BASOS PCT: 1 %
Basophils Absolute: 0 10*3/uL (ref 0.0–0.1)
EOS ABS: 0.2 10*3/uL (ref 0.0–0.5)
Eosinophils Relative: 4 %
HCT: 33.6 % — ABNORMAL LOW (ref 36.0–46.0)
Hemoglobin: 11 g/dL — ABNORMAL LOW (ref 12.0–15.0)
IMMATURE GRANULOCYTES: 0 %
Lymphocytes Relative: 30 %
Lymphs Abs: 1.1 10*3/uL (ref 0.7–4.0)
MCH: 30.4 pg (ref 26.0–34.0)
MCHC: 32.7 g/dL (ref 30.0–36.0)
MCV: 92.8 fL (ref 80.0–100.0)
MONOS PCT: 8 %
Monocytes Absolute: 0.3 10*3/uL (ref 0.1–1.0)
NEUTROS PCT: 57 %
NRBC: 0 % (ref 0.0–0.2)
Neutro Abs: 2.1 10*3/uL (ref 1.7–7.7)
PLATELETS: 156 10*3/uL (ref 150–400)
RBC: 3.62 MIL/uL — AB (ref 3.87–5.11)
RDW: 12.7 % (ref 11.5–15.5)
WBC: 3.8 10*3/uL — AB (ref 4.0–10.5)

## 2018-10-08 LAB — COMPREHENSIVE METABOLIC PANEL
ALK PHOS: 99 U/L (ref 38–126)
ALT: 13 U/L (ref 0–44)
ANION GAP: 10 (ref 5–15)
AST: 15 U/L (ref 15–41)
Albumin: 3.5 g/dL (ref 3.5–5.0)
BUN: 12 mg/dL (ref 6–20)
CALCIUM: 9 mg/dL (ref 8.9–10.3)
CO2: 24 mmol/L (ref 22–32)
Chloride: 106 mmol/L (ref 98–111)
Creatinine, Ser: 1 mg/dL (ref 0.44–1.00)
GFR calc Af Amer: >60 mL/min (ref >60)
GLUCOSE: 131 mg/dL — AB (ref 70–99)
POTASSIUM: 3.6 mmol/L (ref 3.5–5.1)
Sodium: 140 mmol/L (ref 135–145)
Total Bilirubin: 0.3 mg/dL (ref 0.3–1.2)
Total Protein: 6.9 g/dL (ref 6.5–8.1)

## 2018-10-08 MED ORDER — ANASTROZOLE 1 MG PO TABS: 1.0000 mg | ORAL_TABLET | Freq: Every day | ORAL | 2 refills | Status: DC

## 2018-10-08 MED ORDER — SODIUM CHLORIDE 0.9% FLUSH
10.0000 mL | Freq: Once | INTRAVENOUS | Status: AC
  Administered 2018-10-08: 10 mL
  Filled 2018-10-08: qty 10

## 2018-10-08 NOTE — Progress Notes (Signed)
Dropped off [employer] form to managed care to be completed.

## 2018-10-08 NOTE — Patient Instructions (Signed)

## 2018-10-08 NOTE — Telephone Encounter (Signed)
Pt declined avs and calendar  °

## 2018-10-08 NOTE — Telephone Encounter (Signed)
Scheduled appt per 11/21 sch message - left message for patient with apt date and time.

## 2018-10-09 ENCOUNTER — Inpatient Hospital Stay: Payer: BLUE CROSS/BLUE SHIELD

## 2018-10-09 VITALS — BP 85/68 | HR 88 | Temp 98.6°F | Resp 16

## 2018-10-09 DIAGNOSIS — Z5112 Encounter for antineoplastic immunotherapy: Secondary | ICD-10-CM | POA: Diagnosis not present

## 2018-10-09 DIAGNOSIS — G4700 Insomnia, unspecified: Secondary | ICD-10-CM | POA: Diagnosis not present

## 2018-10-09 DIAGNOSIS — R110 Nausea: Secondary | ICD-10-CM | POA: Diagnosis not present

## 2018-10-09 DIAGNOSIS — C773 Secondary and unspecified malignant neoplasm of axilla and upper limb lymph nodes: Secondary | ICD-10-CM | POA: Diagnosis not present

## 2018-10-09 DIAGNOSIS — Z95828 Presence of other vascular implants and grafts: Secondary | ICD-10-CM

## 2018-10-09 DIAGNOSIS — R197 Diarrhea, unspecified: Secondary | ICD-10-CM | POA: Diagnosis not present

## 2018-10-09 DIAGNOSIS — Z9013 Acquired absence of bilateral breasts and nipples: Secondary | ICD-10-CM | POA: Diagnosis not present

## 2018-10-09 DIAGNOSIS — Z23 Encounter for immunization: Secondary | ICD-10-CM | POA: Diagnosis not present

## 2018-10-09 DIAGNOSIS — Z170 Estrogen receptor positive status [ER+]: Secondary | ICD-10-CM | POA: Diagnosis not present

## 2018-10-09 DIAGNOSIS — D649 Anemia, unspecified: Secondary | ICD-10-CM | POA: Diagnosis not present

## 2018-10-09 DIAGNOSIS — R5383 Other fatigue: Secondary | ICD-10-CM | POA: Diagnosis not present

## 2018-10-09 DIAGNOSIS — C50411 Malignant neoplasm of upper-outer quadrant of right female breast: Secondary | ICD-10-CM

## 2018-10-09 DIAGNOSIS — Z79811 Long term (current) use of aromatase inhibitors: Secondary | ICD-10-CM | POA: Diagnosis not present

## 2018-10-09 DIAGNOSIS — Z9221 Personal history of antineoplastic chemotherapy: Secondary | ICD-10-CM | POA: Diagnosis not present

## 2018-10-09 DIAGNOSIS — R232 Flushing: Secondary | ICD-10-CM | POA: Diagnosis not present

## 2018-10-09 DIAGNOSIS — F418 Other specified anxiety disorders: Secondary | ICD-10-CM | POA: Diagnosis not present

## 2018-10-09 DIAGNOSIS — R202 Paresthesia of skin: Secondary | ICD-10-CM | POA: Diagnosis not present

## 2018-10-09 MED ORDER — TRASTUZUMAB CHEMO 150 MG IV SOLR
450.0000 mg | Freq: Once | INTRAVENOUS | Status: AC
  Administered 2018-10-09: 450 mg via INTRAVENOUS
  Filled 2018-10-09: qty 21.43

## 2018-10-09 MED ORDER — DIPHENHYDRAMINE HCL 25 MG PO CAPS: 50.0000 mg | ORAL_CAPSULE | Freq: Once | ORAL | Status: DC

## 2018-10-09 MED ORDER — ACETAMINOPHEN 325 MG PO TABS
ORAL_TABLET | ORAL | Status: AC
  Filled 2018-10-09: qty 2

## 2018-10-09 MED ORDER — SODIUM CHLORIDE 0.9% FLUSH
10.0000 mL | INTRAVENOUS | Status: DC | PRN
  Administered 2018-10-09: 10 mL
  Filled 2018-10-09: qty 10

## 2018-10-09 MED ORDER — GOSERELIN ACETATE 3.6 MG ~~LOC~~ IMPL
3.6000 mg | DRUG_IMPLANT | Freq: Once | SUBCUTANEOUS | Status: AC
  Administered 2018-10-09: 3.6 mg via SUBCUTANEOUS

## 2018-10-09 MED ORDER — ACETAMINOPHEN 325 MG PO TABS
650.0000 mg | ORAL_TABLET | Freq: Once | ORAL | Status: AC
  Administered 2018-10-09: 650 mg via ORAL

## 2018-10-09 MED ORDER — GOSERELIN ACETATE 3.6 MG ~~LOC~~ IMPL
DRUG_IMPLANT | SUBCUTANEOUS | Status: AC
  Filled 2018-10-09: qty 3.6

## 2018-10-09 MED ORDER — SODIUM CHLORIDE 0.9 % IV SOLN
Freq: Once | INTRAVENOUS | Status: AC
  Administered 2018-10-09: 08:00:00 via INTRAVENOUS
  Filled 2018-10-09: qty 250

## 2018-10-09 MED ORDER — SODIUM CHLORIDE 0.9 % IV SOLN
420.0000 mg | Freq: Once | INTRAVENOUS | Status: AC
  Administered 2018-10-09: 420 mg via INTRAVENOUS
  Filled 2018-10-09: qty 14

## 2018-10-09 MED ORDER — HEPARIN SOD (PORK) LOCK FLUSH 100 UNIT/ML IV SOLN
500.0000 [IU] | Freq: Once | INTRAVENOUS | Status: AC | PRN
  Administered 2018-10-09: 500 [IU]
  Filled 2018-10-09: qty 5

## 2018-10-09 NOTE — Patient Instructions (Signed)
[facility] Discharge Instructions for Patients Receiving Chemotherapy  Today you received the following chemotherapy agents :  Herceptin, Perjeta.  To help prevent nausea and vomiting after your treatment, we encourage you to take your nausea medication as prescribed.   If you develop nausea and vomiting that is not controlled by your nausea medication, call the clinic.   BELOW ARE SYMPTOMS THAT SHOULD BE REPORTED IMMEDIATELY:  *FEVER GREATER THAN 100.5 F  *CHILLS WITH OR WITHOUT FEVER  NAUSEA AND VOMITING THAT IS NOT CONTROLLED WITH YOUR NAUSEA MEDICATION  *UNUSUAL SHORTNESS OF BREATH  *UNUSUAL BRUISING OR BLEEDING  TENDERNESS IN MOUTH AND THROAT WITH OR WITHOUT PRESENCE OF ULCERS  *URINARY PROBLEMS  *BOWEL PROBLEMS  UNUSUAL RASH Items with * indicate a potential emergency and should be followed up as soon as possible.  Feel free to call the clinic should you have any questions or concerns. The clinic phone number is [phone].  Please show the CHEMO ALERT CARD at check-in to the Emergency Department and triage nurse.   

## 2018-10-09 NOTE — Progress Notes (Signed)
FMLA successfully faxed to [employer] at [phone]. Mailed copy to patient address on file.

## 2018-10-12 ENCOUNTER — Ambulatory Visit: Payer: BLUE CROSS/BLUE SHIELD

## 2018-10-13 NOTE — Progress Notes (Signed)
[place] Counseling Session   The pt presented to session with a calm mood and matching affect. The pt was forthcoming and engaged throughout session.  The pt reported that she has been cleared to go back to work in January. 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 The pt shared that she needs her partner to complete certain steps in order for her to begin moving forward with her role in building the business.  The pt and counselor processed the pt's goals for herself and her career. 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 The pt and counselor processed ways to communicate her goals and needs related to goals to her partner. The pt expressed that "voicing" this with the counselor was helpful in processing next steps.  The pt and counselor will meet again on December 3 at 1 pm.  Doris Cheadle, Counseling Intern [phone]

## 2018-10-19 ENCOUNTER — Telehealth: Payer: Self-pay | Admitting: Hematology

## 2018-10-19 NOTE — Telephone Encounter (Signed)
Faxed medical record to [facility] at [phone], Release OI:[id number]

## 2018-10-20 ENCOUNTER — Telehealth: Payer: Self-pay | Admitting: Hematology

## 2018-10-20 ENCOUNTER — Telehealth: Payer: Self-pay

## 2018-10-20 NOTE — Telephone Encounter (Signed)
Printed Infusion treatments records for patient for dates 09/21/18 and 10/09/18.  She waited for them to be printed.

## 2018-10-20 NOTE — Telephone Encounter (Signed)
LM  For Ms Sheila [name]  stating that [facility]  in [place] pre surgical testing informed us that she wanted to cancel her surgery with for 11-10-18 with Dr. Gerarda Fraction. Calling to confirm this information and to see if she wants to r/s surgery. Requested that she call back to the office to discuss plan of care.

## 2018-10-20 NOTE — Progress Notes (Signed)
[place] Counseling Session  The pt presented to session with a calm mood and matching affect. The pt was forthcoming and engaged throughout session.  The pt expressed that in the last week, she has felt overwhelmed by financial stress due to medical bills not covered by her insurance. The pt shared that she feels frustrated due to always being financially responsible, but still encountering financial stress.  The pt and counselor processed the pt's experience of financial stress, primarily focusing on the theme of justice vs. injustice. Per the pt, "things happen" and she reported having a sense of humor is an important way of coping with challenges and injustice. 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   The pt and counselor will meet again on December 18 at 3 pm.  Doris Cheadle, Counseling Intern [phone]

## 2018-10-21 ENCOUNTER — Telehealth: Payer: Self-pay

## 2018-10-21 ENCOUNTER — Other Ambulatory Visit: Payer: Self-pay | Admitting: Hematology

## 2018-10-21 NOTE — Telephone Encounter (Addendum)
Outgoing call to patient per last nurse Louise's note, clarifying with pt if she would like to cancel or cancel and reschedule surgery?  No answer, left VM with pt to return our call to clarify her wishes.

## 2018-10-23 ENCOUNTER — Telehealth: Payer: Self-pay | Admitting: *Deleted

## 2018-10-23 NOTE — Telephone Encounter (Signed)
Patient called and left a voicemail stating " I am calling to cancel my appt and surgery. I'm going to put it on hold until at least next year or later." Message forwarded to [facility] APP and Dr. Gerarda Fraction

## 2018-10-26 ENCOUNTER — Encounter: Payer: Self-pay | Admitting: Gynecologic Oncology

## 2018-10-28 ENCOUNTER — Ambulatory Visit: Payer: BLUE CROSS/BLUE SHIELD | Admitting: Obstetrics

## 2018-10-28 NOTE — Progress Notes (Signed)
[place]   [phone] [phone]   Clinic Follow up Note   Patient Care Team: [name], MD as PCP - General (Family Medicine) [name] (Family Medicine) [name], MD as Consulting Physician (General Surgery) [name], MD as Consulting Physician (Hematology) Kyung Rudd, MD as Consulting Physician (Radiation Oncology)  Date of Service:  10/30/2018  CHIEF COMPLAINT: F/u of right breast cancer  SUMMARY OF ONCOLOGIC HISTORY: Oncology History    Cancer Staging Malignant neoplasm of upper-outer quadrant of right breast in female, estrogen receptor positive ([place]) Staging form: Breast, AJCC 8th Edition - Clinical stage from 06/11/2017: Stage IB (cT3, cN0, cM0, G2, ER: Positive, PR: Positive, HER2: Positive) - Signed by [name], MD on 06/16/2017 - Pathologic stage from 07/22/2017: Stage IB (pT3(m), pN2a(sn), cM0, G2, ER: Positive, PR: Positive, HER2: Positive) - Signed by [name], NP on 08/04/2017      Malignant neoplasm of upper-outer quadrant of right breast in female, estrogen receptor positive ([place])   06/11/2017 Initial Biopsy    Diagnosis 06/11/17 Breast, right, needle core biopsy, 11:00 o'clock - INVASIVE DUCTAL CARCINOMA, G2    06/11/2017 Mammogram    [name] and MM Diagnostic Breast Tomo Bilateral 06/11/17 IMPRESSION: 1. Highly suspicious mass within the retroareolar right breast, extending from the 8:00 to 11:00 axes, anterior to posterior depth, extending anteriorly to the nipple with associated nipple retraction, measuring at least 5.5 cm by ultrasound  2. No sonographic evidence of metastatic lymphadenopathy in the right axilla. 3. No evidence of malignancy within the left breast.     06/11/2017 Initial Diagnosis    Malignant neoplasm of upper-outer quadrant of right breast in female, estrogen receptor positive ([place])    06/11/2017 Receptors her2    Estrogen Receptor: 100%, POSITIVE, STRONG STAINING  INTENSITY Progesterone Receptor: 100%, POSITIVE, STRONG STAINING INTENSITY Proliferation Marker Ki67: 12%Proliferation Marker Ki67: 12%  HER2 - **POSITIVE** RATIO OF HER2/CEP17 SIGNALS 2.55 AVERAGE HER2 COPY NUMBER PER CELL 4.85    06/25/2017 Genetic Testing    Negative genetic testing on the 9 gene STAT panel.  The STAT Breast cancer panel offered by [facility] includes sequencing and rearrangement analysis for the following 9 genes:  ATM, BRCA1, BRCA2, CDH1, CHEK2, PALB2, PTEN, STK11 and TP53.   The report date is June 25, 2017.  Negative genetic testing on the common hereditary cancer panel.  The Hereditary Gene Panel offered by [facility] includes sequencing and/or deletion duplication testing of the following 46 genes: APC, ATM, AXIN2, BARD1, BMPR1A, BRCA1, BRCA2, BRIP1, CDH1, CDKN2A (p14ARF), CDKN2A (p16INK4a), CHEK2, CTNNA1, DICER1, EPCAM (Deletion/duplication testing only), GREM1 (promoter region deletion/duplication testing only), KIT, MEN1, MLH1, MSH2, MSH3, MSH6, MUTYH, NBN, NF1, NHTL1, PALB2, PDGFRA, PMS2, POLD1, POLE, PTEN, RAD50, RAD51C, RAD51D, SDHB, SDHC, SDHD, SMAD4, SMARCA4. STK11, TP53, TSC1, TSC2, and VHL.  The following genes were evaluated for sequence changes only: SDHA and HOXB13 c.251G>A variant only.  The report date is June 25, 2017.     07/02/2017 Imaging    CT cAP 07/02/17 IMPRESSION: 1. Subareolar right breast mass. No compelling findings of nodal or metastatic involvement. 2. Old granulomatous disease. 3. There is a 7 mm enhancing or hyperdense focus posteriorly in segment 7 of the liver in the subcapsular region. By virtue of its small size this lesion is technically nonspecific although statistically likely to be a small benign lesions such as flash filling hemangioma. This may warrant surveillance. 4.  Prominent stool throughout the colon favors constipation. 5. Degenerative disc disease  and spondylosis at L5-S1 likely causing mild impingement.    07/02/2017  Imaging    Bone scan 07/02/17 IMPRESSION: Today' s exam is negative. I ascribed the tiny focus of activity just proximal to the left antecubital region to injection site.    07/22/2017 Surgery    RIGHT TOTAL MASTECTOMY WITH RIGHT AXILLARY SENTINEL LYMPH NODE BIOPSY, LEFT PROPHYLACTIC MASTECTOM and INSERTION PORT-A-CATH WITH [name] by [name] and [facility]     07/22/2017 Pathology Results    Diagnosis 1. Breast, simple mastectomy, Left - FIBROADENOMA. - NO MALIGNANCY IDENTIFIED. 2. 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 - ADDITIONAL FOCUS OF LOBULAR CARCINOMA, GRADE 2, SPANNING 2.2 CM. - INTERMEDIATE GRADE DUCTAL CARCINOMA IN SITU. - INVASIVE CARCINOMA COMES TO WITHIN 0.2 TO 0.3 CM OF THE DEEP MARGIN, FOCALLY. - TUMOR FOCALLY INVOLVES EPIDERMIS. - LYMPHOVASCULAR INVASION OF DERMAL LYMPHATICS. - ONE OF ONE LYMPH NODES NEGATIVE FOR CARCINOMA (0/1). - SEE ONCOLOGY TABLE. 3. Lymph node, sentinel, biopsy, Right axillary - ONE LYMPH NODE WITH ISOLATED TUMOR CELLS (0/1). 4. Lymph node, sentinel, biopsy, Right - METASTATIC CARCINOMA IN ONE OF ONE LYMPH NODES (1/1). 5. Lymph node, sentinel, biopsy, Right - ONE OF ONE LYMPH NODES NEGATIVE FOR CARCINOMA (0/1). 6. Lymph node, sentinel, biopsy, Right - METASTATIC CARCINOMA IN ONE OF ONE LYMPH NODES (1/1). - EXTRACAPSULAR EXTENSION. 7. Lymph node, sentinel, biopsy, Right - METASTATIC CARCINOMA IN ONE OF ONE LYMPH NODES (1/1). - EXTRACAPSULAR EXTENSION. 8. Lymph node, sentinel, biopsy, Right - ONE OF ONE LYMPH NODES NEGATIVE FOR CARCINOMA (0/1). 9. Lymph node, sentinel, biopsy, Right - ONE OF ONE LYMPH NODES NEGATIVE FOR CARCINOMA (0/1). 10. Lymph node, sentinel, biopsy, Right - METASTATIC CARCINOMA IN ONE OF ONE LYMPH NODES (1/1). - EXTRACAPSULAR EXTENSION. 11. Lymph node, biopsy, Right axillary - ONE OF ONE LYMPH NODES NEGATIVE FOR CARCINOMA (0/1).     07/22/2017 Receptors  her2    Ductal carcinoma in primary breast tumor and node metastasis are ER 95%, PR 95% strongly positive, and HER2 + The lobular component in primary breast tumor is ER 95% positive, PR 95% positive, strong staining, HER-2 negative.   Original biopsy [id number]: Ductal morphology, Her2 FISH positive. Current specimen: Main focus is 75% ductal morphology, 25% lobular morphology. Smaller focus is lobular morphology. #2A Lobular morphology, Her IHC negative. #2E Lobular morphology, Her2 FISH negative, Her2 IHC negative. #4 50% lobular and 50% ductal morphology, Her2 FISH positive, Her2 IHC negative. #6 Ductal morphology #7 Ductal morphology #10 50% lobular, 50% ductal morphology    08/25/2017 Imaging    MRI abdomen done at [facility]:  Enhancing structure measing 5 mm is compatible with a flash filling hemangioma. No specific findings to suggest metastatic cancer    08/25/2017 Imaging    [facility] Abdomen [facility] 08/25/17  IMPRESSION: 1. Lesion in segment 7 of the liver has signal and enhancement characteristics compatible with a flash filling hemangioma. No specific findings identified to suggest metastatic disease.    08/29/2017 - 12/12/2017 Chemotherapy    adjuvant TCHP every 3 weeks for 6 cycles starting 08/29/17. Postponed and decreased Carbo to 657m and added Onpro with cycle 2 on 09/19/17 due to neutropenic fever. Carbplatin further reduced to 5023mstarting with cycle 3 due to thrombocytopenia. Herceptin was held for Cycle 4 and 5 due to worsening global longitudinal strain found on ECHO from 11/07/17. Completed 6 cycles on 12/12/17       09/04/2017 - 09/09/2017 Hospital Admission  Admit date: 09/04/2017 Discharge date: 09/09/2017  DISCHARGE DIAGNOSES:  Principal Problem:   Sepsis [facility]) Active Problems:   Malignant neoplasm of upper-outer quadrant of right breast in female, estrogen receptor positive ([place])   Port-A-Cath in place   Neutropenic fever ([place])   Hyponatremia    Malignant neoplasm of breast in female, estrogen receptor positive ([place])     12/28/2017 Imaging    CT CHEST IMPRESSION: 1. 2.3 x 2.7 x 1.2 cm irregular masslike opacity within the right middle lobe worrisome for malignancy/metastasis. 2. 3 new very small sclerotic lesions, within the sternum and the body of T4, worrisome for new metastases.    12/28/2017 - 01/05/2018 Hospital Admission    Admit date: 12/28/17-01/05/18 Admission diagnosis: multifocal pneumonia, influenza A, sepsis, hypoxia Additional comments: imaging during hospitalization notable for new irregular masslike opacity in the right middle lobe worrisome for metastasis and new very small sclerotic lesion within the sternum and the body of T4, worrisome for metastasis    12/29/2017 Imaging    CT ABD/PELVIS IMPRESSION: No definite acute intra-abdominal or intrapelvic abnormalities.  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    12/30/2017 Imaging    CT HEAD IMPRESSION: No acute intracranial abnormality or enhancing lesion.    01/16/2018 -  Chemotherapy    Maintence Herceptin and Perjeta every 3 weeks for 6-12 months starting 01/16/18. Held 03/2018-07/17/18 due to low EF.      02/04/2018 PET scan    IMPRESSION: 1. The previous consolidation in the right middle lobe currently has a more nodular and reticular appearance with some associated mild atelectasis along its anterior margin. This demonstrates low-grade metabolic activity with maximum SUV 3.2 (background blood pool activity is 2.7). Although conceivably related to low-grade residuum from the patient's malignancy, the presence of calcified right hilar lymph nodes and small calcifications along the nodularity raise suspicion for a low-grade active granulomatous process or atypical infectious process. Surveillance is recommended. 2. Scattered deposits of hypermetabolic but benign metabolically active brown fat.    02/23/2018 - 04/09/2018  Radiation Therapy    Radiation treatment dates:   02/23/2018 - 04/09/2018  Site/dose:   The patient initially received a dose of 50.4 Gy in 28 fractions to the right chest wall and supraclavicular region. This was delivered using a 3-D conformal, 4 field technique. The patient then received a boost to the mastectomy scar. This delivered an additional 10 Gy in 5 fractions using an en face electron field. The total dose was 60.4 Gy.  Narrative: The patient tolerated radiation treatment relatively well.   The patient had some expected skin irritation as she progressed during treatment. Moist desquamation was not present at the end of treatment.    03/20/2018 -  Anti-estrogen oral therapy    -Monthly Zoladex injection starting 03/20/18  -Letrozole 2.7m once dialy starting 05/03/18  -Switched to Anastrozole starting 08/2018 Due to her worsening back pain.    04/06/2018 Imaging    CT Chest wo contrast IMPRESSION: 1. Stable linear nodular thickening in the RIGHT middle lobe is favored post infectious or inflammatory scarring. 2. Calcified RIGHT hilar lymph node is consistent post infectious process. 3. Stable small LEFT upper lobe nodule. 4. Stable sclerotic lesion in the manubrium. Sclerotic lesion at T3 is more prominent (4 mm). Sclerotic lesion at L 1 is new from 07/02/2017.    04/06/2018 Imaging    04/06/2018 CT Chest IMPRESSION: 1. Stable linear nodular thickening in the RIGHT middle lobe is favored post infectious  or inflammatory scarring. 2. Calcified RIGHT hilar lymph node is consistent post infectious process. 3. Stable small LEFT upper lobe nodule. 4. Stable sclerotic lesion in the manubrium. Sclerotic lesion at T3 is more prominent (4 mm). Sclerotic lesion at L 1 is new from 07/02/2017.    04/10/2018 Echocardiogram    LV EF 45%-50%    05/25/2018 Echocardiogram    LV EF 45%-50%    07/13/2018 Echocardiogram    Normal LV size with EF 55-60%    07/31/2018 Imaging    07/31/2018 Bone  Scan IMPRESSION: No evidence of skeletal metastatic disease.    07/31/2018 Imaging    07/31/2018 CT CAP IMPRESSION: Residual linear and nodular peribronchovascular opacities in the right middle lobe, with other scattered areas of ground-glass opacities in the right middle, right upper and right lower lobes, which are located within the previously demonstrated bulky airspace consolidation. The findings are improved from the chest CT dated 12/30/2017, and relatively stable from PET-CT dated 02/04/2018. These may represent post infectious/post inflammatory changes or residual malignancy.  Stable partially calcified right hilar lymphadenopathy.  Interval development of nodular soft tissue thickening within the anterior mediastinum in the area of the thymus, new from chest CT dated 12/30/2017. This may represent reactive thymic tissue, thymoma or lymphadenopathy.  1.8 cm probably physiologic left ovarian cyst. Borderline thickening of the endometrium measuring 15.6 mm. Further evaluation with pelvic ultrasound may be considered.  No new or suspicious findings within the abdomen.    09/15/2018 Echocardiogram    EF 55-60%      CURRENT THERAPY:  -Herceptin and Perjeta maintenance therapy every 3 weeks starting 01/16/18. Held 03/2018-07/17/18 due to low EF.  -Monthly Zoladex injections starting 03/20/18  -Letrozole 2.78m once daily starting 05/03/18, changed to anastrozole in 07/2018    INTERVAL HISTORY:  ASilvana Holecekis here for a follow up of breast cancer and treatment. She presents to the clinic today by her husband. She notes she is doing well. She notes she still has lower back pain but not as bad as with letrozole. She will take Aleve at times. She will take Flexeril as needed. She notes still feeling tired but she has been more active and shorter naps. She did complete PT and continues to stretch as she is starting to lose ROM again. She notes her appetite is still lower than  normal but stable. She cancelled her BSO for now and will continue with Zoladex injections. She notes mild hot flashes which are tolerable. She notes mostly loose stool, with occasional diarrhea.    REVIEW OF SYSTEMS:   Constitutional: Denies fevers, chills or abnormal weight loss (+) improved fatigue (+) stable, low appetite (+) mild, tolerable hot flashes  Eyes: Denies blurriness of vision Ears, nose, mouth, throat, and face: Denies mucositis or sore throat Respiratory: Denies cough, dyspnea or wheezes Cardiovascular: Denies palpitation, chest discomfort or lower extremity swelling MSK: (+) Improved lower back pain  Gastrointestinal:  Denies nausea, heartburn (+) mostly loose stool, with occasional diarrhea.  Skin: Denies abnormal skin rashes Lymphatics: Denies new lymphadenopathy or easy bruising Neurological:Denies numbness, tingling or new weaknesses Behavioral/Psych: Mood is stable, no new changes  All other systems were reviewed with the patient and are negative.  MEDICAL HISTORY:  Past Medical History:  Diagnosis Date  . Anxiety   . Cancer ([facility]    right breast   . Cardiomyopathy ([place]   . Family history of breast cancer   . Family history of colon cancer   . GERD (  gastroesophageal reflux disease)   . Headache    couple of migraines in the past  . History of kidney stones   . Vitamin D deficiency     SURGICAL HISTORY: Past Surgical History:  Procedure Laterality Date  . egg donation    . kidney stone removal    . MASTECTOMY W/ SENTINEL NODE BIOPSY Bilateral 07/22/2017   Procedure: RIGHT TOTAL MASTECTOMY WITH RIGHT AXILLARY SENTINEL LYMPH NODE BIOPSY, LEFT PROPHYLACTIC MASTECTOMY;  Surgeon: [name], MD;  Location: [place];  Service: General;  Laterality: Bilateral;  . PORTACATH PLACEMENT Right 07/22/2017   Procedure: INSERTION PORT-A-CATH WITH [name];  Surgeon: [name], MD;  Location: [place];  Service: General;  Laterality: Right;    I have reviewed the  social history and family history with the patient and they are unchanged from previous note.  ALLERGIES:  has No Known Allergies.  MEDICATIONS:  Current Outpatient Medications  Medication Sig Dispense Refill  . anastrozole (ARIMIDEX) 1 MG tablet Take 1 tablet (1 mg total) by mouth daily. 90 tablet 2  . B Complex-C (B-COMPLEX WITH VITAMIN C) tablet Take 1 tablet by mouth daily.    . carvedilol (COREG) 3.125 MG tablet TAKE 1 TABLET BY MOUTH 2 TIMES DAILY. 60 tablet 5  . Cholecalciferol (VITAMIN D) 2000 units tablet Take 2,000 Units by mouth daily.    . cyclobenzaprine (FLEXERIL) 5 MG tablet Take 1 tablet (5 mg total) by mouth 2 (two) times daily as needed for muscle spasms. 30 tablet 0  . diphenoxylate-atropine (LOMOTIL) 2.5-0.025 MG tablet Take 1-2 tablets by mouth 4 (four) times daily as needed for diarrhea or loose stools. 30 tablet 0  . levothyroxine (SYNTHROID, LEVOTHROID) 25 MCG tablet Take 25 mcg by mouth daily before breakfast.    . losartan (COZAAR) 25 MG tablet TAKE 1/2 TABLET BY MOUTH AT BEDTIME. 15 tablet 5  . Melatonin 10 MG TABS Take 1 tablet by mouth.     . naproxen sodium (ANAPROX) 220 MG tablet Take 220 mg 2 (two) times daily as needed by mouth (pain).     . Omega-3 Fatty Acids (OMEGA-3 FISH OIL PO) Take 100 Units by mouth.    . venlafaxine XR (EFFEXOR-XR) 150 MG 24 hr capsule TAKE 1 CAPSULE (150 MG TOTAL) BY MOUTH DAILY WITH BREAKFAST. 90 capsule 2  . venlafaxine XR (EFFEXOR-XR) 75 MG 24 hr capsule TAKE 1 CAPSULE BY MOUTH DAILY WITH BREAKFAST.  2   Current Facility-Administered Medications  Medication Dose Route Frequency Provider Last Rate Last Dose  . gi cocktail (Maalox,Lidocaine,Donnatal)  30 mL Oral Once Harle Stanford., PA-C        PHYSICAL EXAMINATION: ECOG PERFORMANCE STATUS: 1 - Symptomatic but completely ambulatory  Vitals:   10/30/18 1306  BP: (!) 100/59  Pulse: 86  Resp: 18  Temp: 98.7 F (37.1 C)  SpO2: 99%   Filed Weights   10/30/18 1306  Weight:  164 lb 11.2 oz (74.7 kg)    GENERAL:alert, no distress and comfortable SKIN: skin color, texture, turgor are normal, no rashes or significant lesions EYES: normal, Conjunctiva are pink and non-injected, sclera clear OROPHARYNX:no exudate, no erythema and lips, buccal mucosa, and tongue normal  NECK: supple, thyroid normal size, non-tender, without nodularity LYMPH:  no palpable lymphadenopathy in the cervical, axillary or inguinal LUNGS: clear to auscultation and percussion with normal breathing effort HEART: regular rate & rhythm and no murmurs and no lower extremity edema ABDOMEN:abdomen soft, non-tender and normal bowel sounds Musculoskeletal:no cyanosis of  digits and no clubbing  NEURO: alert & oriented x 3 with fluent speech, no focal motor/sensory deficits  LABORATORY DATA:  I have reviewed the data as listed CBC Latest Ref Rng & Units 10/30/2018 10/08/2018 09/21/2018  WBC 4.0 - 10.5 K/uL 3.3(L) 3.8(L) 3.8(L)  Hemoglobin 12.0 - 15.0 g/dL 11.0(L) 11.0(L) 11.6(L)  Hematocrit 36.0 - 46.0 % 33.7(L) 33.6(L) 35.0(L)  Platelets 150 - 400 K/uL 158 156 166     CMP Latest Ref Rng & Units 10/30/2018 10/08/2018 09/21/2018  Glucose 70 - 99 mg/dL 73 131(H) 148(H)  BUN 6 - 20 mg/dL _0 Creatinine 0.44 - 1.00 mg/dL 0.99 1.00 1.17(H)  Sodium 135 - 145 mmol/L 140 140 135  Potassium 3.5 - 5.1 mmol/L 4.3 3.6 4.3  Chloride 98 - 111 mmol/L 106 106 102  CO2 22 - 32 mmol/L _1 Calcium 8.9 - 10.3 mg/dL 9.2 9.0 9.0  Total Protein 6.5 - 8.1 g/dL 7.0 6.9 7.3  Total Bilirubin 0.3 - 1.2 mg/dL 0.3 0.3 0.4  Alkaline Phos 38 - 126 U/L 109 99 107  AST 15 - 41 U/L 17 15 10(L)  ALT 0 - 44 U/L _2 RADIOGRAPHIC STUDIES: I have personally reviewed the radiological images as listed and agreed with the findings in the report. No results found.   ASSESSMENT & PLAN:  Gayanne Prescott is a 42 y.o. female with   1. Malignant neoplasm of upper-outer quadrant of right breast in female,  mixed invasive ductal and lobular carcinoma, pT3(m)N2aM0, stage 1b, ductal carcinoma triple positive, lobular carcinoma ER+/PR+/HER2-,  Grade 2 -She was diagnosed in 05/2017. She is s/p right mastectomy, adjuvant TCHP and radiation. She is currently being treated with maintenance Herceptin/Perjeta and antiestrogen therapy with monthly zoladex injections and daily anastrozole.  -She is tolerating treatment well. She has cancelled her BSO for now, and wants to have the surgery next year. She will continue Zoladex injections.  -Labs reviewed, Hg at 11, WBC at 3.3 overall stable. CMP is WNL. Overall adequate to proceed with Herceptin/Perjeta today  -Continue Anastrozole daily  -Will monitor her lung nodules in next scan in March.  -F/u on 12/11/18  2. Genetics -She was negative for all gene mutations tested   3. Insomnia  -She has history of insomnia failed multiple agents such as lunesta, trazodone, tylenol pm, and xanax and only had mild relief with Remeron. -She previously was able to ween off Ativan and Ambien which improved her sleep.   -She currently takes high dose Melatonin to help her sleep, is not very effective.  -improved   4. Hot flash .  -Now mild and tolerable. Continue Effexor 234m daily  5. Bone Health  -Continue Vitamin D supplement and Vitamin C rich food  -We will order a bone density scan in 2020  6. Diffuse Back pain  -Her CT Chest on 04/06/18 showed sclerotic lesions of spine in T3 and L1, but her bone scan in 07/2018 was negative  -Letrozole was changed to anastrozole in September 2019 due to arthralgia -Her back pain returned but more tolerable now, uses Aleve and flexeril as needed.   PLAN:  Labs reviewed and adequate for Herceptin/Perjeta today and continue every 3 weeks Will do lab every 6 weeks Continue Zoladex injection every 4 weeks, next December 18 Will see APP in 6 weeks, and see me in 12 weeks before last dose Herceptin and Perjeta Will repeat chest  CT without contrast  in March 2020    No problem-specific Assessment & Plan notes found for this encounter.   No orders of the defined types were placed in this encounter.  All questions were answered. The patient knows to call the clinic with any problems, questions or concerns. No barriers to learning was detected. I spent 20 minutes counseling the patient face to face. The total time spent in the appointment was 25 minutes and more than 50% was on counseling and review of test results     [name], MD 10/30/2018   I, Joslyn Devon, am acting as scribe for [name], MD.   I have reviewed the above documentation for accuracy and completeness, and I agree with the above.

## 2018-10-30 ENCOUNTER — Inpatient Hospital Stay: Payer: BLUE CROSS/BLUE SHIELD

## 2018-10-30 ENCOUNTER — Inpatient Hospital Stay: Payer: BLUE CROSS/BLUE SHIELD | Attending: Nurse Practitioner | Admitting: Hematology

## 2018-10-30 VITALS — BP 110/72 | HR 85 | Resp 16

## 2018-10-30 VITALS — BP 100/59 | HR 86 | Temp 98.7°F | Resp 18 | Ht 66.0 in | Wt 164.7 lb

## 2018-10-30 DIAGNOSIS — R232 Flushing: Secondary | ICD-10-CM | POA: Diagnosis not present

## 2018-10-30 DIAGNOSIS — C50411 Malignant neoplasm of upper-outer quadrant of right female breast: Secondary | ICD-10-CM | POA: Insufficient documentation

## 2018-10-30 DIAGNOSIS — Z170 Estrogen receptor positive status [ER+]: Principal | ICD-10-CM

## 2018-10-30 DIAGNOSIS — R197 Diarrhea, unspecified: Secondary | ICD-10-CM | POA: Diagnosis not present

## 2018-10-30 DIAGNOSIS — M545 Low back pain: Secondary | ICD-10-CM | POA: Insufficient documentation

## 2018-10-30 DIAGNOSIS — R5383 Other fatigue: Secondary | ICD-10-CM

## 2018-10-30 DIAGNOSIS — Z79899 Other long term (current) drug therapy: Secondary | ICD-10-CM | POA: Insufficient documentation

## 2018-10-30 DIAGNOSIS — M5136 Other intervertebral disc degeneration, lumbar region: Secondary | ICD-10-CM

## 2018-10-30 DIAGNOSIS — G4700 Insomnia, unspecified: Secondary | ICD-10-CM | POA: Insufficient documentation

## 2018-10-30 DIAGNOSIS — F419 Anxiety disorder, unspecified: Secondary | ICD-10-CM | POA: Insufficient documentation

## 2018-10-30 DIAGNOSIS — Z9013 Acquired absence of bilateral breasts and nipples: Secondary | ICD-10-CM | POA: Diagnosis not present

## 2018-10-30 DIAGNOSIS — C773 Secondary and unspecified malignant neoplasm of axilla and upper limb lymph nodes: Secondary | ICD-10-CM | POA: Diagnosis not present

## 2018-10-30 DIAGNOSIS — Z5112 Encounter for antineoplastic immunotherapy: Secondary | ICD-10-CM | POA: Insufficient documentation

## 2018-10-30 DIAGNOSIS — R918 Other nonspecific abnormal finding of lung field: Secondary | ICD-10-CM | POA: Diagnosis not present

## 2018-10-30 DIAGNOSIS — Z95828 Presence of other vascular implants and grafts: Secondary | ICD-10-CM

## 2018-10-30 DIAGNOSIS — Z79811 Long term (current) use of aromatase inhibitors: Secondary | ICD-10-CM | POA: Diagnosis not present

## 2018-10-30 DIAGNOSIS — Z923 Personal history of irradiation: Secondary | ICD-10-CM | POA: Insufficient documentation

## 2018-10-30 DIAGNOSIS — Z9221 Personal history of antineoplastic chemotherapy: Secondary | ICD-10-CM | POA: Diagnosis not present

## 2018-10-30 LAB — COMPREHENSIVE METABOLIC PANEL
ALT: 17 U/L (ref 0–44)
AST: 17 U/L (ref 15–41)
Albumin: 3.7 g/dL (ref 3.5–5.0)
Alkaline Phosphatase: 109 U/L (ref 38–126)
Anion gap: 8 (ref 5–15)
BUN: 10 mg/dL (ref 6–20)
CHLORIDE: 106 mmol/L (ref 98–111)
CO2: 26 mmol/L (ref 22–32)
Calcium: 9.2 mg/dL (ref 8.9–10.3)
Creatinine, Ser: 0.99 mg/dL (ref 0.44–1.00)
GFR calc Af Amer: >60 mL/min (ref >60)
GFR calc non Af Amer: >60 mL/min (ref >60)
GLUCOSE: 73 mg/dL (ref 70–99)
Potassium: 4.3 mmol/L (ref 3.5–5.1)
SODIUM: 140 mmol/L (ref 135–145)
Total Bilirubin: 0.3 mg/dL (ref 0.3–1.2)
Total Protein: 7 g/dL (ref 6.5–8.1)

## 2018-10-30 LAB — CBC WITH DIFFERENTIAL/PLATELET
Abs Immature Granulocytes: 0.01 10*3/uL (ref 0.00–0.07)
Basophils Absolute: 0 10*3/uL (ref 0.0–0.1)
Basophils Relative: 1 %
Eosinophils Absolute: 0.1 10*3/uL (ref 0.0–0.5)
Eosinophils Relative: 4 %
HCT: 33.7 % — ABNORMAL LOW (ref 36.0–46.0)
Hemoglobin: 11 g/dL — ABNORMAL LOW (ref 12.0–15.0)
Immature Granulocytes: 0 %
LYMPHS PCT: 30 %
Lymphs Abs: 1 10*3/uL (ref 0.7–4.0)
MCH: 29.7 pg (ref 26.0–34.0)
MCHC: 32.6 g/dL (ref 30.0–36.0)
MCV: 91.1 fL (ref 80.0–100.0)
Monocytes Absolute: 0.3 10*3/uL (ref 0.1–1.0)
Monocytes Relative: 9 %
NEUTROS ABS: 1.9 10*3/uL (ref 1.7–7.7)
NEUTROS PCT: 56 %
Platelets: 158 10*3/uL (ref 150–400)
RBC: 3.7 MIL/uL — AB (ref 3.87–5.11)
RDW: 13 % (ref 11.5–15.5)
WBC: 3.3 10*3/uL — ABNORMAL LOW (ref 4.0–10.5)
nRBC: 0 % (ref 0.0–0.2)

## 2018-10-30 MED ORDER — SODIUM CHLORIDE 0.9 % IV SOLN
Freq: Once | INTRAVENOUS | Status: AC
  Administered 2018-10-30: 14:00:00 via INTRAVENOUS
  Filled 2018-10-30: qty 250

## 2018-10-30 MED ORDER — HEPARIN SOD (PORK) LOCK FLUSH 100 UNIT/ML IV SOLN
500.0000 [IU] | Freq: Once | INTRAVENOUS | Status: DC | PRN
  Filled 2018-10-30: qty 5

## 2018-10-30 MED ORDER — SODIUM CHLORIDE 0.9% FLUSH
10.0000 mL | Freq: Once | INTRAVENOUS | Status: AC
  Administered 2018-10-30: 10 mL
  Filled 2018-10-30: qty 10

## 2018-10-30 MED ORDER — SODIUM CHLORIDE 0.9 % IV SOLN
420.0000 mg | Freq: Once | INTRAVENOUS | Status: AC
  Administered 2018-10-30: 420 mg via INTRAVENOUS
  Filled 2018-10-30: qty 14

## 2018-10-30 MED ORDER — SODIUM CHLORIDE 0.9% FLUSH
10.0000 mL | INTRAVENOUS | Status: DC | PRN
  Filled 2018-10-30: qty 10

## 2018-10-30 MED ORDER — ACETAMINOPHEN 325 MG PO TABS
650.0000 mg | ORAL_TABLET | Freq: Once | ORAL | Status: AC
  Administered 2018-10-30: 650 mg via ORAL

## 2018-10-30 MED ORDER — DIPHENHYDRAMINE HCL 25 MG PO CAPS: 50.0000 mg | ORAL_CAPSULE | Freq: Once | ORAL | Status: DC

## 2018-10-30 MED ORDER — TRASTUZUMAB CHEMO 150 MG IV SOLR
450.0000 mg | Freq: Once | INTRAVENOUS | Status: AC
  Administered 2018-10-30: 450 mg via INTRAVENOUS
  Filled 2018-10-30: qty 21.43

## 2018-10-30 MED ORDER — ACETAMINOPHEN 325 MG PO TABS
ORAL_TABLET | ORAL | Status: AC
  Filled 2018-10-30: qty 2

## 2018-10-30 NOTE — Patient Instructions (Signed)
[facility] Discharge Instructions for Patients Receiving Chemotherapy  Today you received the following chemotherapy agents :  Herceptin, Perjeta.  To help prevent nausea and vomiting after your treatment, we encourage you to take your nausea medication as prescribed.   If you develop nausea and vomiting that is not controlled by your nausea medication, call the clinic.   BELOW ARE SYMPTOMS THAT SHOULD BE REPORTED IMMEDIATELY:  *FEVER GREATER THAN 100.5 F  *CHILLS WITH OR WITHOUT FEVER  NAUSEA AND VOMITING THAT IS NOT CONTROLLED WITH YOUR NAUSEA MEDICATION  *UNUSUAL SHORTNESS OF BREATH  *UNUSUAL BRUISING OR BLEEDING  TENDERNESS IN MOUTH AND THROAT WITH OR WITHOUT PRESENCE OF ULCERS  *URINARY PROBLEMS  *BOWEL PROBLEMS  UNUSUAL RASH Items with * indicate a potential emergency and should be followed up as soon as possible.  Feel free to call the clinic should you have any questions or concerns. The clinic phone number is [phone].  Please show the CHEMO ALERT CARD at check-in to the Emergency Department and triage nurse.   

## 2018-10-31 ENCOUNTER — Encounter: Payer: Self-pay | Admitting: Hematology

## 2018-11-03 ENCOUNTER — Telehealth: Payer: Self-pay | Admitting: Hematology

## 2018-11-03 NOTE — Telephone Encounter (Signed)
Spoke with patient about appts. Patient stated she was my chart active and did not need for me to mail her a calendar.

## 2018-11-04 ENCOUNTER — Inpatient Hospital Stay: Payer: BLUE CROSS/BLUE SHIELD

## 2018-11-04 DIAGNOSIS — M5136 Other intervertebral disc degeneration, lumbar region: Secondary | ICD-10-CM | POA: Diagnosis not present

## 2018-11-04 DIAGNOSIS — R197 Diarrhea, unspecified: Secondary | ICD-10-CM | POA: Diagnosis not present

## 2018-11-04 DIAGNOSIS — F419 Anxiety disorder, unspecified: Secondary | ICD-10-CM | POA: Diagnosis not present

## 2018-11-04 DIAGNOSIS — C50411 Malignant neoplasm of upper-outer quadrant of right female breast: Secondary | ICD-10-CM | POA: Diagnosis not present

## 2018-11-04 DIAGNOSIS — M545 Low back pain: Secondary | ICD-10-CM | POA: Diagnosis not present

## 2018-11-04 DIAGNOSIS — Z79811 Long term (current) use of aromatase inhibitors: Secondary | ICD-10-CM | POA: Diagnosis not present

## 2018-11-04 DIAGNOSIS — G4700 Insomnia, unspecified: Secondary | ICD-10-CM | POA: Diagnosis not present

## 2018-11-04 DIAGNOSIS — Z170 Estrogen receptor positive status [ER+]: Secondary | ICD-10-CM

## 2018-11-04 DIAGNOSIS — Z9013 Acquired absence of bilateral breasts and nipples: Secondary | ICD-10-CM | POA: Diagnosis not present

## 2018-11-04 DIAGNOSIS — R918 Other nonspecific abnormal finding of lung field: Secondary | ICD-10-CM | POA: Diagnosis not present

## 2018-11-04 DIAGNOSIS — Z95828 Presence of other vascular implants and grafts: Secondary | ICD-10-CM

## 2018-11-04 DIAGNOSIS — R232 Flushing: Secondary | ICD-10-CM | POA: Diagnosis not present

## 2018-11-04 DIAGNOSIS — R5383 Other fatigue: Secondary | ICD-10-CM | POA: Diagnosis not present

## 2018-11-04 DIAGNOSIS — Z5112 Encounter for antineoplastic immunotherapy: Secondary | ICD-10-CM | POA: Diagnosis not present

## 2018-11-04 DIAGNOSIS — Z9221 Personal history of antineoplastic chemotherapy: Secondary | ICD-10-CM | POA: Diagnosis not present

## 2018-11-04 DIAGNOSIS — C773 Secondary and unspecified malignant neoplasm of axilla and upper limb lymph nodes: Secondary | ICD-10-CM | POA: Diagnosis not present

## 2018-11-04 DIAGNOSIS — Z923 Personal history of irradiation: Secondary | ICD-10-CM | POA: Diagnosis not present

## 2018-11-04 MED ORDER — GOSERELIN ACETATE 3.6 MG ~~LOC~~ IMPL
DRUG_IMPLANT | SUBCUTANEOUS | Status: AC
  Filled 2018-11-04: qty 3.6

## 2018-11-04 MED ORDER — GOSERELIN ACETATE 3.6 MG ~~LOC~~ IMPL
3.6000 mg | DRUG_IMPLANT | Freq: Once | SUBCUTANEOUS | Status: AC
  Administered 2018-11-04: 3.6 mg via SUBCUTANEOUS

## 2018-11-04 NOTE — Progress Notes (Signed)
[place] Counseling Session  The pt presented to session with a calm mood and matching affect. The pt was forthcoming and engaged throughout session.  The pt expressed that the last week has been "stressful" for her. The pt related this to challenges in her caregiving role with her partner, as well as spending several days in the last week playing a supportive role with a friend. The pt expressed that although she has been experiencing some stress, she has noticed increases in her energy level in the last 2 weeks. She identified that this seems to have brought about "a more positive mindset." The pt reported that she is more motivated, sharing that she recently took up jewelry making and cross stitch, and she plans to potentially take some business classes at the beginning of next year.  The pt and counselor processed the changes the pt has seen in her energy and motivation. The pt and counselor discussed the connection between engaging in new creative activities and the pt's goal of integrating more meaning making into her daily life.   The pt and counselor will meet again on January 2 at 1 pm.  Doris Cheadle, Counseling Intern [phone]

## 2018-11-09 ENCOUNTER — Inpatient Hospital Stay: Payer: BLUE CROSS/BLUE SHIELD

## 2018-11-10 ENCOUNTER — Encounter (HOSPITAL_COMMUNITY): Admission: RE | Payer: Self-pay | Source: Ambulatory Visit

## 2018-11-10 ENCOUNTER — Ambulatory Visit (HOSPITAL_COMMUNITY): Admission: RE | Admit: 2018-11-10 | Payer: BLUE CROSS/BLUE SHIELD | Source: Ambulatory Visit | Admitting: Obstetrics

## 2018-11-10 SURGERY — SALPINGO-OOPHORECTOMY, BILATERAL, ROBOT-ASSISTED
Anesthesia: General | Laterality: Bilateral

## 2018-11-19 NOTE — Progress Notes (Signed)
[facility]ing Session  The pt presented to session with a calm mood and matching affect. The pt was forthcoming and engaged throughout session.  The pt shared that she has been in the process of cleaning out and organizing her home. The pt expressed that this has "lightened my load." The pt also related the desire to clean out her home and give away some of her possessions to her spirituality, identifying this as a way that she hopes to be more connected with others and grounded in the present. The pt shared that in her relationship with her sister particularly, she has noticed increased connection recently, and she expressed that this sense of connection is important to her.  The pt and counselor processed how the pt is integrating meaningful activities into her life and the impact this has had on her well-being. The pt shared "I'm doing better." The pt and counselor discussed maintenance of well-being through meaningful activities and continuing to communicate needs in relationships. The pt and counselor will meet again on January 14 at 76 am.  Doris Cheadle, Counseling Intern [phone]

## 2018-11-20 ENCOUNTER — Inpatient Hospital Stay: Payer: BLUE CROSS/BLUE SHIELD | Attending: Nurse Practitioner

## 2018-11-20 ENCOUNTER — Other Ambulatory Visit: Payer: BLUE CROSS/BLUE SHIELD

## 2018-11-20 VITALS — BP 107/63 | HR 79 | Temp 97.5°F | Resp 16

## 2018-11-20 DIAGNOSIS — R5383 Other fatigue: Secondary | ICD-10-CM | POA: Insufficient documentation

## 2018-11-20 DIAGNOSIS — Z5112 Encounter for antineoplastic immunotherapy: Secondary | ICD-10-CM | POA: Diagnosis not present

## 2018-11-20 DIAGNOSIS — M549 Dorsalgia, unspecified: Secondary | ICD-10-CM | POA: Diagnosis not present

## 2018-11-20 DIAGNOSIS — Z9013 Acquired absence of bilateral breasts and nipples: Secondary | ICD-10-CM | POA: Insufficient documentation

## 2018-11-20 DIAGNOSIS — Z923 Personal history of irradiation: Secondary | ICD-10-CM | POA: Diagnosis not present

## 2018-11-20 DIAGNOSIS — G4700 Insomnia, unspecified: Secondary | ICD-10-CM | POA: Insufficient documentation

## 2018-11-20 DIAGNOSIS — C773 Secondary and unspecified malignant neoplasm of axilla and upper limb lymph nodes: Secondary | ICD-10-CM | POA: Diagnosis not present

## 2018-11-20 DIAGNOSIS — Z79899 Other long term (current) drug therapy: Secondary | ICD-10-CM | POA: Insufficient documentation

## 2018-11-20 DIAGNOSIS — Z79811 Long term (current) use of aromatase inhibitors: Secondary | ICD-10-CM | POA: Insufficient documentation

## 2018-11-20 DIAGNOSIS — R918 Other nonspecific abnormal finding of lung field: Secondary | ICD-10-CM | POA: Diagnosis not present

## 2018-11-20 DIAGNOSIS — Z9221 Personal history of antineoplastic chemotherapy: Secondary | ICD-10-CM | POA: Diagnosis not present

## 2018-11-20 DIAGNOSIS — C50411 Malignant neoplasm of upper-outer quadrant of right female breast: Secondary | ICD-10-CM | POA: Diagnosis not present

## 2018-11-20 DIAGNOSIS — F419 Anxiety disorder, unspecified: Secondary | ICD-10-CM | POA: Insufficient documentation

## 2018-11-20 DIAGNOSIS — K219 Gastro-esophageal reflux disease without esophagitis: Secondary | ICD-10-CM | POA: Insufficient documentation

## 2018-11-20 DIAGNOSIS — Z170 Estrogen receptor positive status [ER+]: Secondary | ICD-10-CM | POA: Insufficient documentation

## 2018-11-20 DIAGNOSIS — R232 Flushing: Secondary | ICD-10-CM | POA: Diagnosis not present

## 2018-11-20 MED ORDER — SODIUM CHLORIDE 0.9 % IV SOLN
420.0000 mg | Freq: Once | INTRAVENOUS | Status: AC
  Administered 2018-11-20: 420 mg via INTRAVENOUS
  Filled 2018-11-20: qty 14

## 2018-11-20 MED ORDER — SODIUM CHLORIDE 0.9% FLUSH
10.0000 mL | INTRAVENOUS | Status: DC | PRN
  Administered 2018-11-20: 10 mL
  Filled 2018-11-20: qty 10

## 2018-11-20 MED ORDER — ACETAMINOPHEN 325 MG PO TABS
650.0000 mg | ORAL_TABLET | Freq: Once | ORAL | Status: AC
  Administered 2018-11-20: 650 mg via ORAL

## 2018-11-20 MED ORDER — SODIUM CHLORIDE 0.9 % IV SOLN
Freq: Once | INTRAVENOUS | Status: AC
  Administered 2018-11-20: 14:00:00 via INTRAVENOUS
  Filled 2018-11-20: qty 250

## 2018-11-20 MED ORDER — TRASTUZUMAB CHEMO 150 MG IV SOLR
450.0000 mg | Freq: Once | INTRAVENOUS | Status: AC
  Administered 2018-11-20: 450 mg via INTRAVENOUS
  Filled 2018-11-20: qty 21.43

## 2018-11-20 MED ORDER — DIPHENHYDRAMINE HCL 25 MG PO CAPS: 50.0000 mg | ORAL_CAPSULE | Freq: Once | ORAL | Status: DC

## 2018-11-20 MED ORDER — ACETAMINOPHEN 325 MG PO TABS
ORAL_TABLET | ORAL | Status: AC
  Filled 2018-11-20: qty 1

## 2018-11-20 MED ORDER — HEPARIN SOD (PORK) LOCK FLUSH 100 UNIT/ML IV SOLN
500.0000 [IU] | Freq: Once | INTRAVENOUS | Status: AC | PRN
  Administered 2018-11-20: 500 [IU]
  Filled 2018-11-20: qty 5

## 2018-11-20 NOTE — Patient Instructions (Signed)
[facility] Discharge Instructions for Patients Receiving Chemotherapy  Today you received the following chemotherapy agents :  Herceptin, Perjeta.  To help prevent nausea and vomiting after your treatment, we encourage you to take your nausea medication as prescribed.   If you develop nausea and vomiting that is not controlled by your nausea medication, call the clinic.   BELOW ARE SYMPTOMS THAT SHOULD BE REPORTED IMMEDIATELY:  *FEVER GREATER THAN 100.5 F  *CHILLS WITH OR WITHOUT FEVER  NAUSEA AND VOMITING THAT IS NOT CONTROLLED WITH YOUR NAUSEA MEDICATION  *UNUSUAL SHORTNESS OF BREATH  *UNUSUAL BRUISING OR BLEEDING  TENDERNESS IN MOUTH AND THROAT WITH OR WITHOUT PRESENCE OF ULCERS  *URINARY PROBLEMS  *BOWEL PROBLEMS  UNUSUAL RASH Items with * indicate a potential emergency and should be followed up as soon as possible.  Feel free to call the clinic should you have any questions or concerns. The clinic phone number is [phone].  Please show the CHEMO ALERT CARD at check-in to the Emergency Department and triage nurse.   

## 2018-11-20 NOTE — Progress Notes (Signed)
Pt refused to stay 30 min observation period; pt stable; VSS. Knows to call with any questions/concerns. See flow sheet.

## 2018-11-24 ENCOUNTER — Ambulatory Visit: Payer: BLUE CROSS/BLUE SHIELD | Admitting: Obstetrics

## 2018-12-01 NOTE — Progress Notes (Signed)
[facility] Counseling Session  The pt presented to session with a calm mood and matching affect. The pt was forthcoming and engaged throughout session.  The pt shared that as she has been continuing to organize her personal belongings and give some items away, she has recognized the significance of pictures in maintaining memories. The pt expressed that she wished that she had more pictures of the time during her cancer treatment, which she related to a desire to be able to be reminded that "it could always be worse" and "I am strong." The pt shared that having some documentation of her cancer experience would be an encouraging reminder of her own strength, as well as a way to help others understand her. The pt shared that "I am trying to reconnect to the mainstream," and she reported that she feels like this can be challenging due to lack of understanding from others.  The pt and counselor processed creative ways that the pt may be able to document her cancer experience. The pt expressed that she believes it is important to share her story so that she can move forward and others can understand what she has been through.  The pt and counselor will meet again on Tuesday, January 21 at 2 pm.  Doris Cheadle, Counseling Intern [phone]

## 2018-12-04 ENCOUNTER — Inpatient Hospital Stay: Payer: BLUE CROSS/BLUE SHIELD

## 2018-12-04 VITALS — BP 99/59 | HR 86 | Temp 98.0°F | Resp 18

## 2018-12-04 DIAGNOSIS — C773 Secondary and unspecified malignant neoplasm of axilla and upper limb lymph nodes: Secondary | ICD-10-CM | POA: Diagnosis not present

## 2018-12-04 DIAGNOSIS — R232 Flushing: Secondary | ICD-10-CM | POA: Diagnosis not present

## 2018-12-04 DIAGNOSIS — Z170 Estrogen receptor positive status [ER+]: Secondary | ICD-10-CM | POA: Diagnosis not present

## 2018-12-04 DIAGNOSIS — R5383 Other fatigue: Secondary | ICD-10-CM | POA: Diagnosis not present

## 2018-12-04 DIAGNOSIS — R918 Other nonspecific abnormal finding of lung field: Secondary | ICD-10-CM | POA: Diagnosis not present

## 2018-12-04 DIAGNOSIS — Z9221 Personal history of antineoplastic chemotherapy: Secondary | ICD-10-CM | POA: Diagnosis not present

## 2018-12-04 DIAGNOSIS — M549 Dorsalgia, unspecified: Secondary | ICD-10-CM | POA: Diagnosis not present

## 2018-12-04 DIAGNOSIS — C50411 Malignant neoplasm of upper-outer quadrant of right female breast: Secondary | ICD-10-CM

## 2018-12-04 DIAGNOSIS — K219 Gastro-esophageal reflux disease without esophagitis: Secondary | ICD-10-CM | POA: Diagnosis not present

## 2018-12-04 DIAGNOSIS — Z79811 Long term (current) use of aromatase inhibitors: Secondary | ICD-10-CM | POA: Diagnosis not present

## 2018-12-04 DIAGNOSIS — Z79899 Other long term (current) drug therapy: Secondary | ICD-10-CM | POA: Diagnosis not present

## 2018-12-04 DIAGNOSIS — Z9013 Acquired absence of bilateral breasts and nipples: Secondary | ICD-10-CM | POA: Diagnosis not present

## 2018-12-04 DIAGNOSIS — F419 Anxiety disorder, unspecified: Secondary | ICD-10-CM | POA: Diagnosis not present

## 2018-12-04 DIAGNOSIS — G4700 Insomnia, unspecified: Secondary | ICD-10-CM | POA: Diagnosis not present

## 2018-12-04 DIAGNOSIS — Z923 Personal history of irradiation: Secondary | ICD-10-CM | POA: Diagnosis not present

## 2018-12-04 DIAGNOSIS — Z5112 Encounter for antineoplastic immunotherapy: Secondary | ICD-10-CM | POA: Diagnosis not present

## 2018-12-04 DIAGNOSIS — Z95828 Presence of other vascular implants and grafts: Secondary | ICD-10-CM

## 2018-12-04 MED ORDER — GOSERELIN ACETATE 3.6 MG ~~LOC~~ IMPL
DRUG_IMPLANT | SUBCUTANEOUS | Status: AC
  Filled 2018-12-04: qty 3.6

## 2018-12-04 MED ORDER — GOSERELIN ACETATE 3.6 MG ~~LOC~~ IMPL
3.6000 mg | DRUG_IMPLANT | Freq: Once | SUBCUTANEOUS | Status: AC
  Administered 2018-12-04: 3.6 mg via SUBCUTANEOUS

## 2018-12-08 NOTE — Progress Notes (Signed)
[facility] Counseling Session  The pt presented to session with a calm mood and matching affect. The pt was forthcoming and engaged throughout session.  The pt and counselor discussed the theme from last session of "trying to reconnect to the mainstream." The pt identified fear and uncertainty related to this transition. The pt reported that she feels frustrated and "helpless" because of her energy levels not increasing in the way that she has hoped. The pt shared "I know I have the strength, not sure about the stamina" when talking about returning to full-time work. The pt expressed feeling isolated in her experience of feeling stuck between her cancer experience and her "new normal." The pt and counselor discussed support group options and may explore these further in the next session.  The pt and counselor will meet again on Tuesday, February 4 at 2 pm.  Doris Cheadle, Counseling Intern [phone]

## 2018-12-09 NOTE — Progress Notes (Signed)
[place]   [phone] [phone]   Clinic Follow up Note   Patient Care Team: [name], MD as PCP - General (Family Medicine) [name] (Family Medicine) [name], MD as Consulting Physician (General Surgery) [name], MD as Consulting Physician (Hematology) Kyung Rudd, MD as Consulting Physician (Radiation Oncology)  Date of Service:  12/11/2018  CHIEF COMPLAINT: F/u of right breast cancer   SUMMARY OF ONCOLOGIC HISTORY: Oncology History    Cancer Staging Malignant neoplasm of upper-outer quadrant of right breast in female, estrogen receptor positive ([place]) Staging form: Breast, AJCC 8th Edition - Clinical stage from 06/11/2017: Stage IB (cT3, cN0, cM0, G2, ER: Positive, PR: Positive, HER2: Positive) - Signed by [name], MD on 06/16/2017 - Pathologic stage from 07/22/2017: Stage IB (pT3(m), pN2a(sn), cM0, G2, ER: Positive, PR: Positive, HER2: Positive) - Signed by [name], NP on 08/04/2017      Malignant neoplasm of upper-outer quadrant of right breast in female, estrogen receptor positive ([place])   06/11/2017 Initial Biopsy    Diagnosis 06/11/17 Breast, right, needle core biopsy, 11:00 o'clock - INVASIVE DUCTAL CARCINOMA, G2    06/11/2017 Mammogram    [name] and [facility] Diagnostic Breast Tomo Bilateral 06/11/17 IMPRESSION: 1. Highly suspicious mass within the retroareolar right breast, extending from the 8:00 to 11:00 axes, anterior to posterior depth, extending anteriorly to the nipple with associated nipple retraction, measuring at least 5.5 cm by ultrasound  2. No sonographic evidence of metastatic lymphadenopathy in the right axilla. 3. No evidence of malignancy within the left breast.     06/11/2017 Initial Diagnosis    Malignant neoplasm of upper-outer quadrant of right breast in female, estrogen receptor positive ([place])    06/11/2017 Receptors her2    Estrogen Receptor: 100%, POSITIVE, STRONG STAINING  INTENSITY Progesterone Receptor: 100%, POSITIVE, STRONG STAINING INTENSITY Proliferation Marker Ki67: 12%Proliferation Marker Ki67: 12%  HER2 - **POSITIVE** RATIO OF HER2/CEP17 SIGNALS 2.55 AVERAGE HER2 COPY NUMBER PER CELL 4.85    06/25/2017 Genetic Testing    Negative genetic testing on the 9 gene STAT panel.  The STAT Breast cancer panel offered by [facility] includes sequencing and rearrangement analysis for the following 9 genes:  ATM, BRCA1, BRCA2, CDH1, CHEK2, PALB2, PTEN, STK11 and TP53.   The report date is June 25, 2017.  Negative genetic testing on the common hereditary cancer panel.  The Hereditary Gene Panel offered by [facility] includes sequencing and/or deletion duplication testing of the following 46 genes: APC, ATM, AXIN2, BARD1, BMPR1A, BRCA1, BRCA2, BRIP1, CDH1, CDKN2A (p14ARF), CDKN2A (p16INK4a), CHEK2, CTNNA1, DICER1, EPCAM (Deletion/duplication testing only), GREM1 (promoter region deletion/duplication testing only), KIT, MEN1, MLH1, MSH2, MSH3, MSH6, MUTYH, NBN, NF1, NHTL1, PALB2, PDGFRA, PMS2, POLD1, POLE, PTEN, RAD50, RAD51C, RAD51D, SDHB, SDHC, SDHD, SMAD4, SMARCA4. STK11, TP53, TSC1, TSC2, and VHL.  The following genes were evaluated for sequence changes only: SDHA and HOXB13 c.251G>A variant only.  The report date is June 25, 2017.     07/02/2017 Imaging    CT cAP 07/02/17 IMPRESSION: 1. Subareolar right breast mass. No compelling findings of nodal or metastatic involvement. 2. Old granulomatous disease. 3. There is a 7 mm enhancing or hyperdense focus posteriorly in segment 7 of the liver in the subcapsular region. By virtue of its small size this lesion is technically nonspecific although statistically likely to be a small benign lesions such as flash filling hemangioma. This may warrant surveillance. 4.  Prominent stool throughout the colon favors constipation. 5. Degenerative disc  disease and spondylosis at L5-S1 likely causing mild impingement.    07/02/2017  Imaging    Bone scan 07/02/17 IMPRESSION: Today' s exam is negative. I ascribed the tiny focus of activity just proximal to the left antecubital region to injection site.    07/22/2017 Surgery    RIGHT TOTAL MASTECTOMY WITH RIGHT AXILLARY SENTINEL LYMPH NODE BIOPSY, LEFT PROPHYLACTIC MASTECTOM and INSERTION PORT-A-CATH WITH [name] by [name] and [facility]     07/22/2017 Pathology Results    Diagnosis 1. Breast, simple mastectomy, Left - FIBROADENOMA. - NO MALIGNANCY IDENTIFIED. 2. 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 - ADDITIONAL FOCUS OF LOBULAR CARCINOMA, GRADE 2, SPANNING 2.2 CM. - INTERMEDIATE GRADE DUCTAL CARCINOMA IN SITU. - INVASIVE CARCINOMA COMES TO WITHIN 0.2 TO 0.3 CM OF THE DEEP MARGIN, FOCALLY. - TUMOR FOCALLY INVOLVES EPIDERMIS. - LYMPHOVASCULAR INVASION OF DERMAL LYMPHATICS. - ONE OF ONE LYMPH NODES NEGATIVE FOR CARCINOMA (0/1). - SEE ONCOLOGY TABLE. 3. Lymph node, sentinel, biopsy, Right axillary - ONE LYMPH NODE WITH ISOLATED TUMOR CELLS (0/1). 4. Lymph node, sentinel, biopsy, Right - METASTATIC CARCINOMA IN ONE OF ONE LYMPH NODES (1/1). 5. Lymph node, sentinel, biopsy, Right - ONE OF ONE LYMPH NODES NEGATIVE FOR CARCINOMA (0/1). 6. Lymph node, sentinel, biopsy, Right - METASTATIC CARCINOMA IN ONE OF ONE LYMPH NODES (1/1). - EXTRACAPSULAR EXTENSION. 7. Lymph node, sentinel, biopsy, Right - METASTATIC CARCINOMA IN ONE OF ONE LYMPH NODES (1/1). - EXTRACAPSULAR EXTENSION. 8. Lymph node, sentinel, biopsy, Right - ONE OF ONE LYMPH NODES NEGATIVE FOR CARCINOMA (0/1). 9. Lymph node, sentinel, biopsy, Right - ONE OF ONE LYMPH NODES NEGATIVE FOR CARCINOMA (0/1). 10. Lymph node, sentinel, biopsy, Right - METASTATIC CARCINOMA IN ONE OF ONE LYMPH NODES (1/1). - EXTRACAPSULAR EXTENSION. 11. Lymph node, biopsy, Right axillary - ONE OF ONE LYMPH NODES NEGATIVE FOR CARCINOMA (0/1).     07/22/2017 Receptors  her2    Ductal carcinoma in primary breast tumor and node metastasis are ER 95%, PR 95% strongly positive, and HER2 + The lobular component in primary breast tumor is ER 95% positive, PR 95% positive, strong staining, HER-2 negative.   Original biopsy [id number]: Ductal morphology, Her2 FISH positive. Current specimen: Main focus is 75% ductal morphology, 25% lobular morphology. Smaller focus is lobular morphology. #2A Lobular morphology, Her IHC negative. #2E Lobular morphology, Her2 FISH negative, Her2 IHC negative. #4 50% lobular and 50% ductal morphology, Her2 FISH positive, Her2 IHC negative. #6 Ductal morphology #7 Ductal morphology #10 50% lobular, 50% ductal morphology    08/25/2017 Imaging    MRI abdomen done at [facility]:  Enhancing structure measing 5 mm is compatible with a flash filling hemangioma. No specific findings to suggest metastatic cancer    08/25/2017 Imaging    MRI Abdomen at [facility] 08/25/17  IMPRESSION: 1. Lesion in segment 7 of the liver has signal and enhancement characteristics compatible with a flash filling hemangioma. No specific findings identified to suggest metastatic disease.    08/29/2017 - 12/12/2017 Chemotherapy    adjuvant TCHP every 3 weeks for 6 cycles starting 08/29/17. Postponed and decreased Carbo to 661m and added Onpro with cycle 2 on 09/19/17 due to neutropenic fever. Carbplatin further reduced to 5020mstarting with cycle 3 due to thrombocytopenia. Herceptin was held for Cycle 4 and 5 due to worsening global longitudinal strain found on ECHO from 11/07/17. Completed 6 cycles on 12/12/17       09/04/2017 - 09/09/2017 Hospital Admission  Admit date: 09/04/2017 Discharge date: 09/09/2017  DISCHARGE DIAGNOSES:  Principal Problem:   Sepsis [facility]) Active Problems:   Malignant neoplasm of upper-outer quadrant of right breast in female, estrogen receptor positive ([place])   Port-A-Cath in place   Neutropenic fever ([place])   Hyponatremia    Malignant neoplasm of breast in female, estrogen receptor positive ([place])     12/28/2017 Imaging    CT CHEST IMPRESSION: 1. 2.3 x 2.7 x 1.2 cm irregular masslike opacity within the right middle lobe worrisome for malignancy/metastasis. 2. 3 new very small sclerotic lesions, within the sternum and the body of T4, worrisome for new metastases.    12/28/2017 - 01/05/2018 Hospital Admission    Admit date: 12/28/17-01/05/18 Admission diagnosis: multifocal pneumonia, influenza A, sepsis, hypoxia Additional comments: imaging during hospitalization notable for new irregular masslike opacity in the right middle lobe worrisome for metastasis and new very small sclerotic lesion within the sternum and the body of T4, worrisome for metastasis    12/29/2017 Imaging    CT ABD/PELVIS IMPRESSION: No definite acute intra-abdominal or intrapelvic abnormalities.  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    12/30/2017 Imaging    CT HEAD IMPRESSION: No acute intracranial abnormality or enhancing lesion.    01/16/2018 -  Chemotherapy    Maintence Herceptin and Perjeta every 3 weeks for 6-12 months starting 01/16/18. Held 03/2018-07/17/18 due to low EF.      02/04/2018 PET scan    IMPRESSION: 1. The previous consolidation in the right middle lobe currently has a more nodular and reticular appearance with some associated mild atelectasis along its anterior margin. This demonstrates low-grade metabolic activity with maximum SUV 3.2 (background blood pool activity is 2.7). Although conceivably related to low-grade residuum from the patient's malignancy, the presence of calcified right hilar lymph nodes and small calcifications along the nodularity raise suspicion for a low-grade active granulomatous process or atypical infectious process. Surveillance is recommended. 2. Scattered deposits of hypermetabolic but benign metabolically active brown fat.    02/23/2018 - 04/09/2018  Radiation Therapy    Radiation treatment dates:   02/23/2018 - 04/09/2018  Site/dose:   The patient initially received a dose of 50.4 Gy in 28 fractions to the right chest wall and supraclavicular region. This was delivered using a 3-D conformal, 4 field technique. The patient then received a boost to the mastectomy scar. This delivered an additional 10 Gy in 5 fractions using an en face electron field. The total dose was 60.4 Gy.  Narrative: The patient tolerated radiation treatment relatively well.   The patient had some expected skin irritation as she progressed during treatment. Moist desquamation was not present at the end of treatment.    03/20/2018 -  Anti-estrogen oral therapy    -Monthly Zoladex injection starting 03/20/18  -Letrozole 2.7m once dialy starting 05/03/18  -Switched to Anastrozole starting 08/2018 Due to her worsening back pain.    04/06/2018 Imaging    CT Chest wo contrast IMPRESSION: 1. Stable linear nodular thickening in the RIGHT middle lobe is favored post infectious or inflammatory scarring. 2. Calcified RIGHT hilar lymph node is consistent post infectious process. 3. Stable small LEFT upper lobe nodule. 4. Stable sclerotic lesion in the manubrium. Sclerotic lesion at T3 is more prominent (4 mm). Sclerotic lesion at L 1 is new from 07/02/2017.    04/06/2018 Imaging    04/06/2018 CT Chest IMPRESSION: 1. Stable linear nodular thickening in the RIGHT middle lobe is favored post infectious  or inflammatory scarring. 2. Calcified RIGHT hilar lymph node is consistent post infectious process. 3. Stable small LEFT upper lobe nodule. 4. Stable sclerotic lesion in the manubrium. Sclerotic lesion at T3 is more prominent (4 mm). Sclerotic lesion at L 1 is new from 07/02/2017.    04/10/2018 Echocardiogram    LV EF 45%-50%    05/25/2018 Echocardiogram    LV EF 45%-50%    07/13/2018 Echocardiogram    Normal LV size with EF 55-60%    07/31/2018 Imaging    07/31/2018 Bone  Scan IMPRESSION: No evidence of skeletal metastatic disease.    07/31/2018 Imaging    07/31/2018 CT CAP IMPRESSION: Residual linear and nodular peribronchovascular opacities in the right middle lobe, with other scattered areas of ground-glass opacities in the right middle, right upper and right lower lobes, which are located within the previously demonstrated bulky airspace consolidation. The findings are improved from the chest CT dated 12/30/2017, and relatively stable from PET-CT dated 02/04/2018. These may represent post infectious/post inflammatory changes or residual malignancy.  Stable partially calcified right hilar lymphadenopathy.  Interval development of nodular soft tissue thickening within the anterior mediastinum in the area of the thymus, new from chest CT dated 12/30/2017. This may represent reactive thymic tissue, thymoma or lymphadenopathy.  1.8 cm probably physiologic left ovarian cyst. Borderline thickening of the endometrium measuring 15.6 mm. Further evaluation with pelvic ultrasound may be considered.  No new or suspicious findings within the abdomen.    09/15/2018 Echocardiogram    EF 55-60%      CURRENT THERAPY:  -Herceptin and Perjeta maintenance therapy every 3 weeks starting 01/16/18. Held as needed  due to low EF. Plan to complete in 02/22/19.  -Monthly Zoladex injections starting 03/20/18  -Letrozole 2.37m once daily starting 05/03/18, changed to anastrozole in 07/2018   INTERVAL HISTORY:  AIyania Denneis here for a follow up of right breast cancer. She presents to the clinic today with her husband. She notes she is doing well. She tolerated restarting Herceptin/Perjeta today. She notes her energy has mildly improved but still naps. She notes back soreness which is baseline for her along with joint pain. We reviewed her medication list today. She would like to get anastrozole refilled monthly instead of every 3 months. She notes her hot flashes  are manageable and tolerates anastrozole well.  She notes she is sleeping better as she does not wake up as often during the night.    REVIEW OF SYSTEMS:  Constitutional: Denies fevers, chills or abnormal weight loss (+) Slight improvement in energy (+) hot flashes, manageable   Eyes: Denies blurriness of vision Ears, nose, mouth, throat, and face: Denies mucositis or sore throat Respiratory: Denies cough, dyspnea or wheezes Cardiovascular: Denies palpitation, chest discomfort or lower extremity swelling Gastrointestinal:  Denies nausea, heartburn or change in bowel habits Skin: Denies abnormal skin rashes MSK; (+) Back soreness (+) Joint pain  Lymphatics: Denies new lymphadenopathy or easy bruising Neurological:Denies numbness, tingling or new weaknesses Behavioral/Psych: Mood is stable, no new changes  All other systems were reviewed with the patient and are negative.  MEDICAL HISTORY:  Past Medical History:  Diagnosis Date  . Anxiety   . Cancer ([facility]    right breast   . [place]   . Family history of breast cancer   . Family history of colon cancer   . GERD (gastroesophageal reflux disease)   . Headache    couple of migraines in the past  . History of  kidney stones   . Vitamin D deficiency     SURGICAL HISTORY: Past Surgical History:  Procedure Laterality Date  . egg donation    . kidney stone removal    . MASTECTOMY W/ SENTINEL NODE BIOPSY Bilateral 07/22/2017   Procedure: RIGHT TOTAL MASTECTOMY WITH RIGHT AXILLARY SENTINEL LYMPH NODE BIOPSY, LEFT PROPHYLACTIC MASTECTOMY;  Surgeon: [name], MD;  Location: [place];  Service: General;  Laterality: Bilateral;  . PORTACATH PLACEMENT Right 07/22/2017   Procedure: INSERTION PORT-A-CATH WITH [name];  Surgeon: [name], MD;  Location: [place];  Service: General;  Laterality: Right;    I have reviewed the social history and family history with the patient and they are unchanged from previous  note.  ALLERGIES:  has No Known Allergies.  MEDICATIONS:  Current Outpatient Medications  Medication Sig Dispense Refill  . anastrozole (ARIMIDEX) 1 MG tablet Take 1 tablet (1 mg total) by mouth daily. 30 tablet 11  . B Complex-C (B-COMPLEX WITH VITAMIN C) tablet Take 1 tablet by mouth daily.    . carvedilol (COREG) 3.125 MG tablet TAKE 1 TABLET BY MOUTH 2 TIMES DAILY. 60 tablet 5  . Cholecalciferol (VITAMIN D) 2000 units tablet Take 2,000 Units by mouth daily.    . cyclobenzaprine (FLEXERIL) 5 MG tablet Take 1 tablet (5 mg total) by mouth 2 (two) times daily as needed for muscle spasms. 30 tablet 0  . diphenoxylate-atropine (LOMOTIL) 2.5-0.025 MG tablet Take 1-2 tablets by mouth 4 (four) times daily as needed for diarrhea or loose stools. 30 tablet 0  . levothyroxine (SYNTHROID, LEVOTHROID) 25 MCG tablet Take 25 mcg by mouth daily before breakfast.    . losartan (COZAAR) 25 MG tablet TAKE 1/2 TABLET BY MOUTH AT BEDTIME. 15 tablet 5  . Melatonin 10 MG TABS Take 1 tablet by mouth.     . naproxen sodium (ANAPROX) 220 MG tablet Take 220 mg 2 (two) times daily as needed by mouth (pain).     . Omega-3 Fatty Acids (OMEGA-3 FISH OIL PO) Take 100 Units by mouth.    . venlafaxine XR (EFFEXOR-XR) 150 MG 24 hr capsule TAKE 1 CAPSULE (150 MG TOTAL) BY MOUTH DAILY WITH BREAKFAST. 90 capsule 2  . venlafaxine XR (EFFEXOR-XR) 75 MG 24 hr capsule TAKE 1 CAPSULE BY MOUTH DAILY WITH BREAKFAST.  2   Current Facility-Administered Medications  Medication Dose Route Frequency Provider Last Rate Last Dose  . gi cocktail (Maalox,Lidocaine,Donnatal)  30 mL Oral Once Harle Stanford., PA-C       Facility-Administered Medications Ordered in Other Visits  Medication Dose Route Frequency Provider Last Rate Last Dose  . diphenhydrAMINE (BENADRYL) capsule 50 mg  50 mg Oral Once [name], MD      . sodium chloride flush (NS) 0.9 % injection 10 mL  10 mL Intracatheter PRN [name], MD   10 mL at 12/11/18 1636     PHYSICAL EXAMINATION: ECOG PERFORMANCE STATUS: 1 - Symptomatic but completely ambulatory  Vitals:   12/11/18 1304  BP: 109/70  Pulse: 100  Resp: 18  Temp: 98.7 F (37.1 C)  SpO2: 100%   Filed Weights   12/11/18 1304  Weight: 165 lb 9.6 oz (75.1 kg)    GENERAL:alert, no distress and comfortable SKIN: skin color, texture, turgor are normal, no rashes or significant lesions EYES: normal, Conjunctiva are pink and non-injected, sclera clear OROPHARYNX:no exudate, no erythema and lips, buccal mucosa, and tongue normal  NECK: supple, thyroid normal size, non-tender, without nodularity LYMPH:  no palpable lymphadenopathy in the cervical, axillary or inguinal LUNGS: clear to auscultation and percussion with normal breathing effort HEART: regular rate & rhythm and no murmurs and no lower extremity edema ABDOMEN:abdomen soft, non-tender and normal bowel sounds Musculoskeletal:no cyanosis of digits and no clubbing  NEURO: alert & oriented x 3 with fluent speech, no focal motor/sensory deficits BREAST: S/p b/l mastectomy: Surgical incision healed well. No palpable mass or adenopathy in either breasts   LABORATORY DATA:  I have reviewed the data as listed CBC Latest Ref Rng & Units 12/11/2018 10/30/2018 10/08/2018  WBC 4.0 - 10.5 K/uL 2.9(L) 3.3(L) 3.8(L)  Hemoglobin 12.0 - 15.0 g/dL 11.7(L) 11.0(L) 11.0(L)  Hematocrit 36.0 - 46.0 % 34.2(L) 33.7(L) 33.6(L)  Platelets 150 - 400 K/uL 181 158 156     CMP Latest Ref Rng & Units 12/11/2018 10/30/2018 10/08/2018  Glucose 70 - 99 mg/dL 107(H) 73 131(H)  BUN 6 - 20 mg/dL _0 Creatinine 0.44 - 1.00 mg/dL 1.01(H) 0.99 1.00  Sodium 135 - 145 mmol/L 139 140 140  Potassium 3.5 - 5.1 mmol/L 4.3 4.3 3.6  Chloride 98 - 111 mmol/L 105 106 106  CO2 22 - 32 mmol/L _1 Calcium 8.9 - 10.3 mg/dL 9.3 9.2 9.0  Total Protein 6.5 - 8.1 g/dL 7.1 7.0 6.9  Total Bilirubin 0.3 - 1.2 mg/dL 0.5 0.3 0.3  Alkaline Phos 38 - 126 U/L 107 109 99   AST 15 - 41 U/L _2 ALT 0 - 44 U/L _3 RADIOGRAPHIC STUDIES: I have personally reviewed the radiological images as listed and agreed with the findings in the report. No results found.   ASSESSMENT & PLAN:  Sheila [name] is a 43 y.o. female with    1. Malignant neoplasm of upper-outer quadrant of right breast in female, mixed invasive ductal and lobular carcinoma, pT3(m)N2aM0, stage 1b, ductal carcinoma triple positive, lobular carcinoma ER+/PR+/HER2-, Grade 2 -She was diagnosed in 05/2017. She is s/p b/l mastectomy, adjuvant TCHP and radiation.  -She is currently being treated with maintenance Herceptin/Perjeta and antiestrogen therapy with monthly Zoladex injections and daily anastrozole.  -Plan to complete Herceptin/Perjeta 01/22/19, some delay due to her decreased EF on f/u which has improved lately  -She is tolerating treatment well. She has cancelled her BSO for now, and wants to have the surgery later this year. She will continue Zoladex injections monthly -Her next ECHO is next week.  -She is clinically doing well. Breast exam unremarkable. Labs reviewed, CBC Hg at 11.7, ANC at 1.4, WBC at 2.9. CMP and Ca 27/29 is still pending. Overall adequate to proceed with Herceptin/Perjeta today  -I briefly discussed the option of further treatment with Nerlynx to reduce her risk of cancer recurrence. Will discuss this further at next visit.  -Continue Anastrozole, refilled today  -I offered her the chance to attend Survivorship Clinic to adjust post treatment. She is interested. Will schedule at next visit.  -F/u on 3/6    2. Genetics -She was negative for all gene mutations tested   3. Insomnia  -She has history of insomnia failed multiple agents such as lunesta, trazodone, tylenol pm, and xanax and only had mild relief with Remeron. -She previously was able to ween off Ativan and Ambien which improved her sleep.   -She currently takes high dose Melatonin to help  her sleep, is not very effective.  -Continues to improve.   4. Hot flash .  -  Now mild and tolerable. Continue Effexor 235m daily  5. Bone Health  -Continue Vitamin D supplement and Vitamin C rich food  -We will order a bone density scan in 2020  6. Diffuse Back pain  -Her CT Chest on 04/06/18 showed sclerotic lesions of spine in T3 and L1, but her bone scan in 07/2018 was negative  -Letrozole was changed to anastrozole in September 2019 due to arthralgia -Her back pain is stable and tolerable now, uses Aleve and flexeril as needed.   7. Lung lesions  -She was found to have masslike opacity in the right middle lobe when she was admitted for neutropenic fever in February 2019, it got progressively worse during the hospital stay, felt to be related to pneumonia, on subsequent follow-up scan, this has much improved, but still has some residual nodule opacity and groundglass opacity in the right lung on the last CT scan in September 2019. -We will repeat CT chest in 2-3 months    PLAN: -Labs reviewed and adequate for Herceptin/Perjeta today and continue every 3 weeks for 2 more doses  -Continue Anastrozole, refilled today  -Lab, flush, f/u and Herceptin/Perjeta on 3/6 -Zoladex injection in 3 and 7 weeks  -will discuss Nerlynx on next visit, and order a repeated CT chest    No problem-specific Assessment & Plan notes found for this encounter.   No orders of the defined types were placed in this encounter.  All questions were answered. The patient knows to call the clinic with any problems, questions or concerns. No barriers to learning was detected. I spent 20 minutes counseling the patient face to face. The total time spent in the appointment was 25 minutes and more than 50% was on counseling and review of test results     YTruitt Merle MD 12/11/2018   I, AJoslyn Devon am acting as scribe for YTruitt Merle MD.   I have reviewed the above documentation for accuracy and completeness,  and I agree with the above.

## 2018-12-11 ENCOUNTER — Telehealth: Payer: Self-pay | Admitting: Hematology

## 2018-12-11 ENCOUNTER — Inpatient Hospital Stay: Payer: BLUE CROSS/BLUE SHIELD | Admitting: Hematology

## 2018-12-11 ENCOUNTER — Encounter: Payer: Self-pay | Admitting: Hematology

## 2018-12-11 ENCOUNTER — Inpatient Hospital Stay: Payer: BLUE CROSS/BLUE SHIELD

## 2018-12-11 VITALS — BP 109/70 | HR 100 | Temp 98.7°F | Resp 18 | Ht 66.0 in | Wt 165.6 lb

## 2018-12-11 DIAGNOSIS — Z170 Estrogen receptor positive status [ER+]: Principal | ICD-10-CM

## 2018-12-11 DIAGNOSIS — F419 Anxiety disorder, unspecified: Secondary | ICD-10-CM | POA: Diagnosis not present

## 2018-12-11 DIAGNOSIS — R5383 Other fatigue: Secondary | ICD-10-CM | POA: Diagnosis not present

## 2018-12-11 DIAGNOSIS — C50411 Malignant neoplasm of upper-outer quadrant of right female breast: Secondary | ICD-10-CM

## 2018-12-11 DIAGNOSIS — R198 Other specified symptoms and signs involving the digestive system and abdomen: Secondary | ICD-10-CM

## 2018-12-11 DIAGNOSIS — Z79899 Other long term (current) drug therapy: Secondary | ICD-10-CM | POA: Diagnosis not present

## 2018-12-11 DIAGNOSIS — C773 Secondary and unspecified malignant neoplasm of axilla and upper limb lymph nodes: Secondary | ICD-10-CM

## 2018-12-11 DIAGNOSIS — K219 Gastro-esophageal reflux disease without esophagitis: Secondary | ICD-10-CM | POA: Diagnosis not present

## 2018-12-11 DIAGNOSIS — G4700 Insomnia, unspecified: Secondary | ICD-10-CM | POA: Diagnosis not present

## 2018-12-11 DIAGNOSIS — Z9221 Personal history of antineoplastic chemotherapy: Secondary | ICD-10-CM

## 2018-12-11 DIAGNOSIS — Z79811 Long term (current) use of aromatase inhibitors: Secondary | ICD-10-CM | POA: Diagnosis not present

## 2018-12-11 DIAGNOSIS — Z9013 Acquired absence of bilateral breasts and nipples: Secondary | ICD-10-CM

## 2018-12-11 DIAGNOSIS — Z923 Personal history of irradiation: Secondary | ICD-10-CM

## 2018-12-11 DIAGNOSIS — Z95828 Presence of other vascular implants and grafts: Secondary | ICD-10-CM

## 2018-12-11 DIAGNOSIS — M549 Dorsalgia, unspecified: Secondary | ICD-10-CM | POA: Diagnosis not present

## 2018-12-11 DIAGNOSIS — R918 Other nonspecific abnormal finding of lung field: Secondary | ICD-10-CM | POA: Diagnosis not present

## 2018-12-11 DIAGNOSIS — Z5112 Encounter for antineoplastic immunotherapy: Secondary | ICD-10-CM | POA: Diagnosis not present

## 2018-12-11 DIAGNOSIS — R232 Flushing: Secondary | ICD-10-CM | POA: Diagnosis not present

## 2018-12-11 LAB — CBC WITH DIFFERENTIAL/PLATELET
Abs Immature Granulocytes: 0 10*3/uL (ref 0.00–0.07)
BASOS PCT: 1 %
Basophils Absolute: 0 10*3/uL (ref 0.0–0.1)
Eosinophils Absolute: 0.1 10*3/uL (ref 0.0–0.5)
Eosinophils Relative: 4 %
HCT: 34.2 % — ABNORMAL LOW (ref 36.0–46.0)
Hemoglobin: 11.7 g/dL — ABNORMAL LOW (ref 12.0–15.0)
Immature Granulocytes: 0 %
Lymphocytes Relative: 37 %
Lymphs Abs: 1.1 10*3/uL (ref 0.7–4.0)
MCH: 31 pg (ref 26.0–34.0)
MCHC: 34.2 g/dL (ref 30.0–36.0)
MCV: 90.5 fL (ref 80.0–100.0)
MONOS PCT: 9 %
Monocytes Absolute: 0.3 10*3/uL (ref 0.1–1.0)
Neutro Abs: 1.4 10*3/uL — ABNORMAL LOW (ref 1.7–7.7)
Neutrophils Relative %: 49 %
Platelets: 181 10*3/uL (ref 150–400)
RBC: 3.78 MIL/uL — ABNORMAL LOW (ref 3.87–5.11)
RDW: 13 % (ref 11.5–15.5)
WBC: 2.9 10*3/uL — ABNORMAL LOW (ref 4.0–10.5)
nRBC: 0 % (ref 0.0–0.2)

## 2018-12-11 LAB — COMPREHENSIVE METABOLIC PANEL
ALT: 16 U/L (ref 0–44)
AST: 16 U/L (ref 15–41)
Albumin: 3.7 g/dL (ref 3.5–5.0)
Alkaline Phosphatase: 107 U/L (ref 38–126)
Anion gap: 9 (ref 5–15)
BILIRUBIN TOTAL: 0.5 mg/dL (ref 0.3–1.2)
BUN: 11 mg/dL (ref 6–20)
CO2: 25 mmol/L (ref 22–32)
Calcium: 9.3 mg/dL (ref 8.9–10.3)
Chloride: 105 mmol/L (ref 98–111)
Creatinine, Ser: 1.01 mg/dL — ABNORMAL HIGH (ref 0.44–1.00)
GFR calc Af Amer: >60 mL/min (ref >60)
GFR calc non Af Amer: >60 mL/min (ref >60)
Glucose, Bld: 107 mg/dL — ABNORMAL HIGH (ref 70–99)
Potassium: 4.3 mmol/L (ref 3.5–5.1)
Sodium: 139 mmol/L (ref 135–145)
TOTAL PROTEIN: 7.1 g/dL (ref 6.5–8.1)

## 2018-12-11 MED ORDER — SODIUM CHLORIDE 0.9 % IV SOLN
420.0000 mg | Freq: Once | INTRAVENOUS | Status: AC
  Administered 2018-12-11: 420 mg via INTRAVENOUS
  Filled 2018-12-11: qty 14

## 2018-12-11 MED ORDER — SODIUM CHLORIDE 0.9% FLUSH
10.0000 mL | Freq: Once | INTRAVENOUS | Status: AC
  Administered 2018-12-11: 10 mL
  Filled 2018-12-11: qty 10

## 2018-12-11 MED ORDER — ANASTROZOLE 1 MG PO TABS: 1.0000 mg | ORAL_TABLET | Freq: Every day | ORAL | 11 refills | Status: DC

## 2018-12-11 MED ORDER — HEPARIN SOD (PORK) LOCK FLUSH 100 UNIT/ML IV SOLN
500.0000 [IU] | Freq: Once | INTRAVENOUS | Status: AC | PRN
  Administered 2018-12-11: 500 [IU]
  Filled 2018-12-11: qty 5

## 2018-12-11 MED ORDER — ACETAMINOPHEN 325 MG PO TABS
650.0000 mg | ORAL_TABLET | Freq: Once | ORAL | Status: AC
  Administered 2018-12-11: 650 mg via ORAL

## 2018-12-11 MED ORDER — TRASTUZUMAB CHEMO 150 MG IV SOLR
450.0000 mg | Freq: Once | INTRAVENOUS | Status: AC
  Administered 2018-12-11: 450 mg via INTRAVENOUS
  Filled 2018-12-11: qty 21.43

## 2018-12-11 MED ORDER — SODIUM CHLORIDE 0.9% FLUSH
10.0000 mL | INTRAVENOUS | Status: DC | PRN
  Administered 2018-12-11: 10 mL
  Filled 2018-12-11: qty 10

## 2018-12-11 MED ORDER — ACETAMINOPHEN 325 MG PO TABS
ORAL_TABLET | ORAL | Status: AC
  Filled 2018-12-11: qty 2

## 2018-12-11 MED ORDER — DIPHENHYDRAMINE HCL 25 MG PO CAPS: 50.0000 mg | ORAL_CAPSULE | Freq: Once | ORAL | Status: DC

## 2018-12-11 MED ORDER — SODIUM CHLORIDE 0.9 % IV SOLN
Freq: Once | INTRAVENOUS | Status: AC
  Administered 2018-12-11: 14:00:00 via INTRAVENOUS
  Filled 2018-12-11: qty 250

## 2018-12-11 NOTE — Telephone Encounter (Signed)
Gave patient avs report and appointments for February and March.  °

## 2018-12-11 NOTE — Progress Notes (Signed)
Patient refused to stay for the 30 minutes post observation after Perjeta infusion, education provided and she verbalized understanding.

## 2018-12-11 NOTE — Patient Instructions (Signed)
[facility] Discharge Instructions for Patients Receiving Chemotherapy  Today you received the following chemotherapy agents Herceptin and Perjeta.   To help prevent nausea and vomiting after your treatment, we encourage you to take your nausea medication as directed.   If you develop nausea and vomiting that is not controlled by your nausea medication, call the clinic.   BELOW ARE SYMPTOMS THAT SHOULD BE REPORTED IMMEDIATELY:  *FEVER GREATER THAN 100.5 F  *CHILLS WITH OR WITHOUT FEVER  NAUSEA AND VOMITING THAT IS NOT CONTROLLED WITH YOUR NAUSEA MEDICATION  *UNUSUAL SHORTNESS OF BREATH  *UNUSUAL BRUISING OR BLEEDING  TENDERNESS IN MOUTH AND THROAT WITH OR WITHOUT PRESENCE OF ULCERS  *URINARY PROBLEMS  *BOWEL PROBLEMS  UNUSUAL RASH Items with * indicate a potential emergency and should be followed up as soon as possible.  Feel free to call the clinic should you have any questions or concerns. The clinic phone number is [phone].  Please show the CHEMO ALERT CARD at check-in to the Emergency Department and triage nurse.   

## 2018-12-12 LAB — CANCER ANTIGEN 27.29: CA 27.29: 10.4 U/mL (ref 0.0–38.6)

## 2018-12-18 ENCOUNTER — Ambulatory Visit (HOSPITAL_BASED_OUTPATIENT_CLINIC_OR_DEPARTMENT_OTHER)
Admission: RE | Admit: 2018-12-18 | Discharge: 2018-12-18 | Disposition: A | Discharge status: Home / Self Care | Payer: BLUE CROSS/BLUE SHIELD | Source: Ambulatory Visit | Attending: Cardiology | Admitting: Cardiology

## 2018-12-18 ENCOUNTER — Other Ambulatory Visit (HOSPITAL_COMMUNITY): Payer: Self-pay | Admitting: *Deleted

## 2018-12-18 ENCOUNTER — Encounter (HOSPITAL_COMMUNITY): Payer: Self-pay | Admitting: Cardiology

## 2018-12-18 ENCOUNTER — Ambulatory Visit (HOSPITAL_COMMUNITY)
Admission: RE | Admit: 2018-12-18 | Discharge: 2018-12-18 | Disposition: A | Discharge status: Home / Self Care | Payer: BLUE CROSS/BLUE SHIELD | Source: Ambulatory Visit | Attending: Family Medicine | Admitting: Family Medicine

## 2018-12-18 VITALS — BP 104/70 | HR 102 | Wt 167.0 lb

## 2018-12-18 DIAGNOSIS — Z9013 Acquired absence of bilateral breasts and nipples: Secondary | ICD-10-CM | POA: Insufficient documentation

## 2018-12-18 DIAGNOSIS — I509 Heart failure, unspecified: Secondary | ICD-10-CM | POA: Diagnosis not present

## 2018-12-18 DIAGNOSIS — C50411 Malignant neoplasm of upper-outer quadrant of right female breast: Secondary | ICD-10-CM

## 2018-12-18 DIAGNOSIS — R000 Tachycardia, unspecified: Secondary | ICD-10-CM | POA: Diagnosis not present

## 2018-12-18 DIAGNOSIS — Z170 Estrogen receptor positive status [ER+]: Secondary | ICD-10-CM

## 2018-12-18 DIAGNOSIS — Z79899 Other long term (current) drug therapy: Secondary | ICD-10-CM | POA: Insufficient documentation

## 2018-12-18 DIAGNOSIS — Z801 Family history of malignant neoplasm of trachea, bronchus and lung: Secondary | ICD-10-CM | POA: Insufficient documentation

## 2018-12-18 DIAGNOSIS — Z806 Family history of leukemia: Secondary | ICD-10-CM | POA: Diagnosis not present

## 2018-12-18 DIAGNOSIS — Z79811 Long term (current) use of aromatase inhibitors: Secondary | ICD-10-CM | POA: Diagnosis not present

## 2018-12-18 DIAGNOSIS — Z803 Family history of malignant neoplasm of breast: Secondary | ICD-10-CM | POA: Insufficient documentation

## 2018-12-18 DIAGNOSIS — Z8042 Family history of malignant neoplasm of prostate: Secondary | ICD-10-CM | POA: Diagnosis not present

## 2018-12-18 DIAGNOSIS — I429 Cardiomyopathy, unspecified: Secondary | ICD-10-CM | POA: Diagnosis not present

## 2018-12-18 DIAGNOSIS — I428 Other cardiomyopathies: Secondary | ICD-10-CM

## 2018-12-18 DIAGNOSIS — Z800 Family history of malignant neoplasm of digestive organs: Secondary | ICD-10-CM | POA: Diagnosis not present

## 2018-12-18 DIAGNOSIS — Z791 Long term (current) use of non-steroidal anti-inflammatories (NSAID): Secondary | ICD-10-CM | POA: Insufficient documentation

## 2018-12-18 NOTE — Patient Instructions (Signed)
Your physician has requested that you have an echocardiogram. Echocardiography is a painless test that uses sound waves to create images of your heart. It provides your doctor with information about the size and shape of your heart and how well your heart's chambers and valves are working. This procedure takes approximately one hour. There are no restrictions for this procedure.  Your physician recommends that you schedule a follow-up appointment in: 3 months with Dr. Aundra Dubin and an ECHO

## 2018-12-18 NOTE — Progress Notes (Signed)
  Echocardiogram 2D Echocardiogram has been performed.  Sheila [name] 12/18/2018, 1:51 PM

## 2018-12-20 IMAGING — CT CT ABD-PELV W/ CM
2 of 4 series · 16 of 46 positions shown, 18 images · IV contrast (ISOVUE 300)
Comparison: 09/05/2017

CLINICAL DATA: Abdominal pain, history of RIGHT breast cancer stage
IB (ER/PR positive, HER2 Luis R positive), sinus tachycardia,
development of fever, headache and coughing last week, husband had
the flu, generalized aches, nausea, vomiting and diarrhea

EXAM:
CT ABDOMEN AND PELVIS WITH CONTRAST
TECHNIQUE: Multidetector CT imaging of the abdomen and pelvis was performed
using the standard protocol following bolus administration of
intravenous contrast. Sagittal and coronal MPR images reconstructed
from axial data set.
CONTRAST:  100mL [id number] IOPAMIDOL (TQ4EBJ-[id number]) INJECTION 61% IV.
Patient refused oral contrast.

[Series 2: axial st · axial · 0.75mm/px · z∈[-675,-225]mm · 13 of 100 slices shown, 15 images]
[im 5/100  soft-tissue]
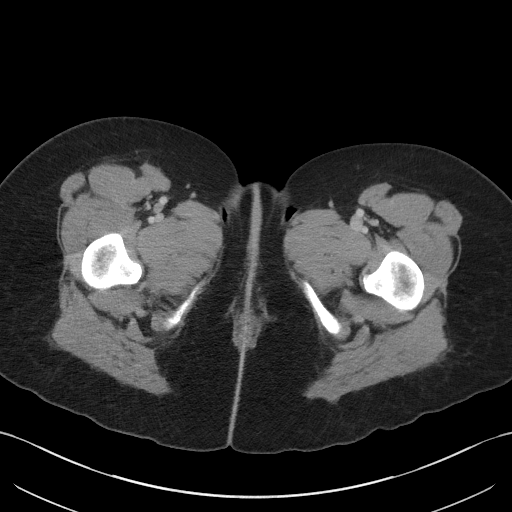
[im 5/100  bone]
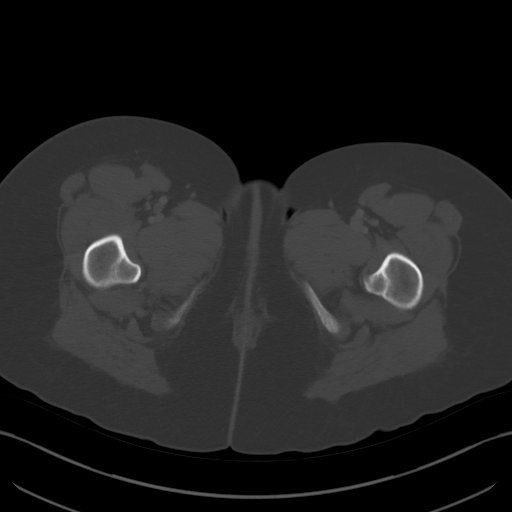
[im 15/100  soft-tissue]
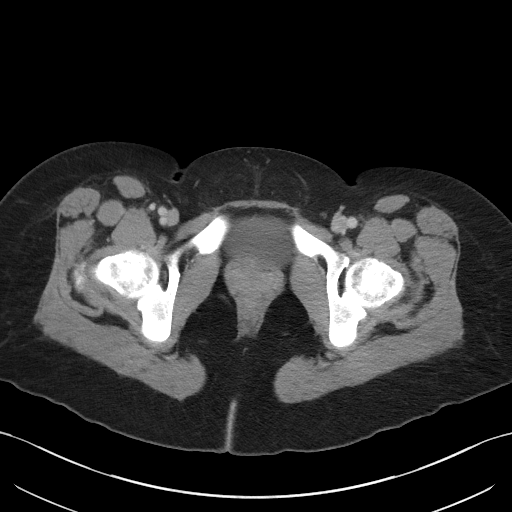
[im 19/100  soft-tissue]
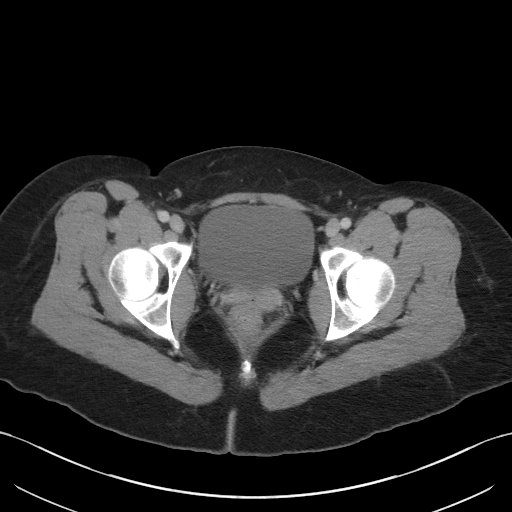
[im 29/100  soft-tissue]
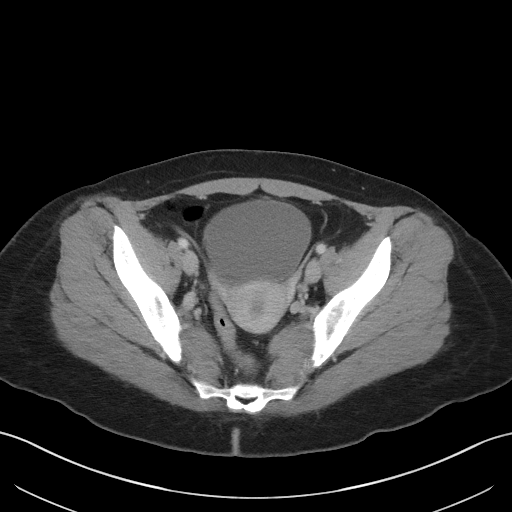
[im 34/100  soft-tissue]
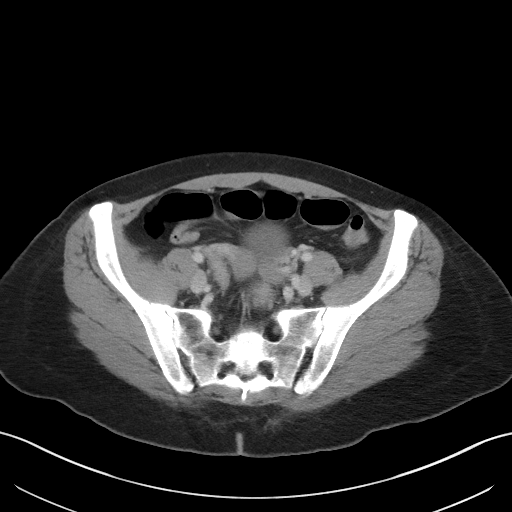
[im 43/100  soft-tissue]
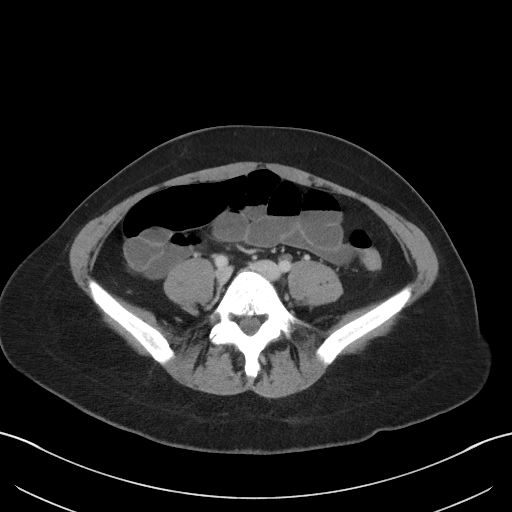
[im 52/100  soft-tissue]
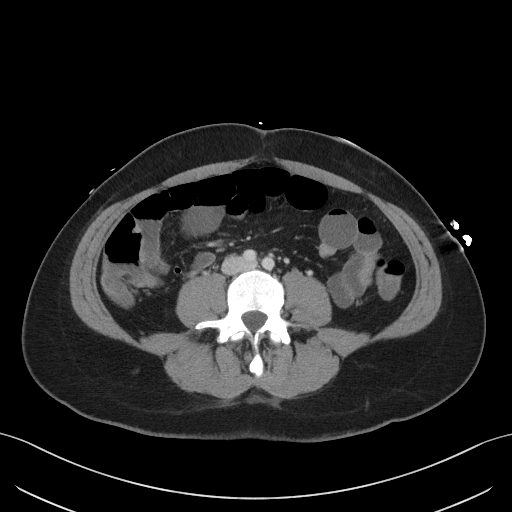
[im 57/100  soft-tissue]
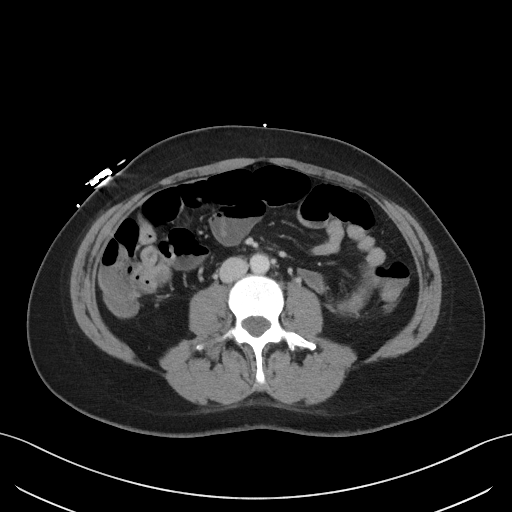
[im 67/100  soft-tissue]
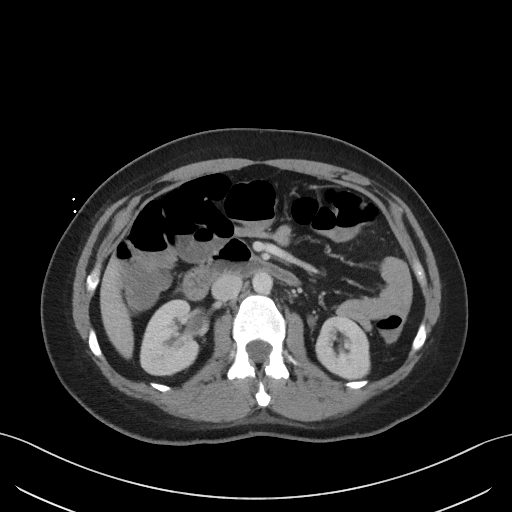
[im 67/100  bone]
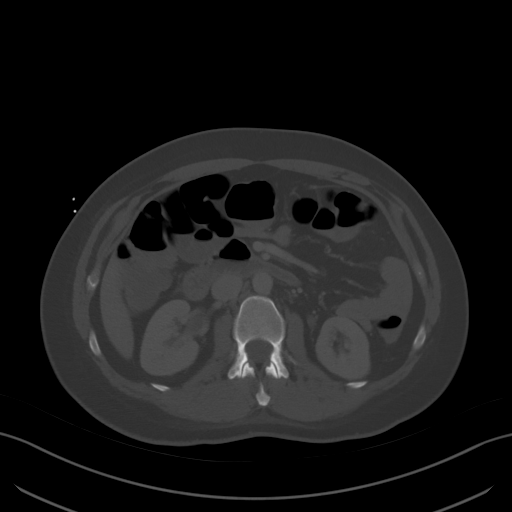
[im 71/100  soft-tissue]
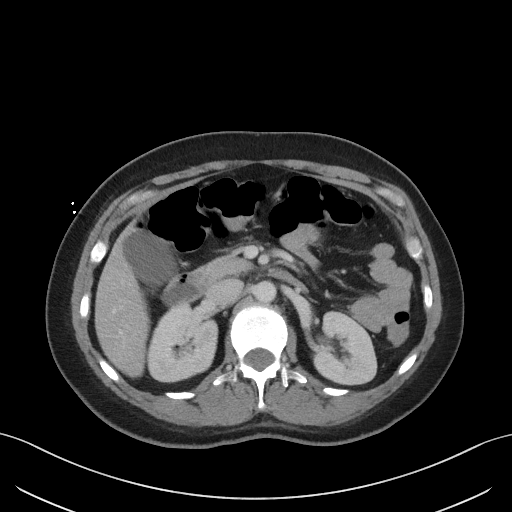
[im 81/100  soft-tissue]
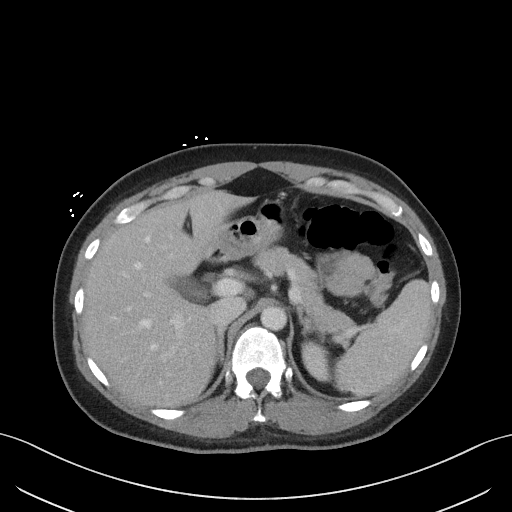
[im 85/100  soft-tissue]
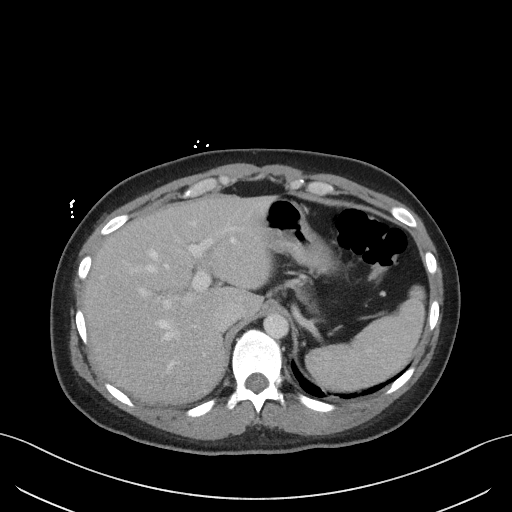
[im 95/100  soft-tissue]
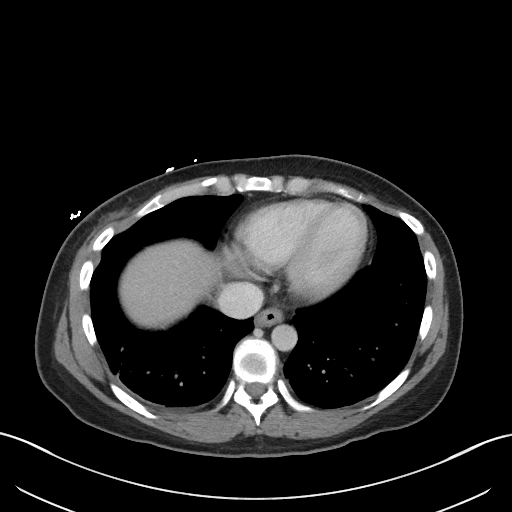

[Series 4: coronal st · coronal · 0.74mm/px · 3 of 69 slices shown]
[im 23/69  soft-tissue]
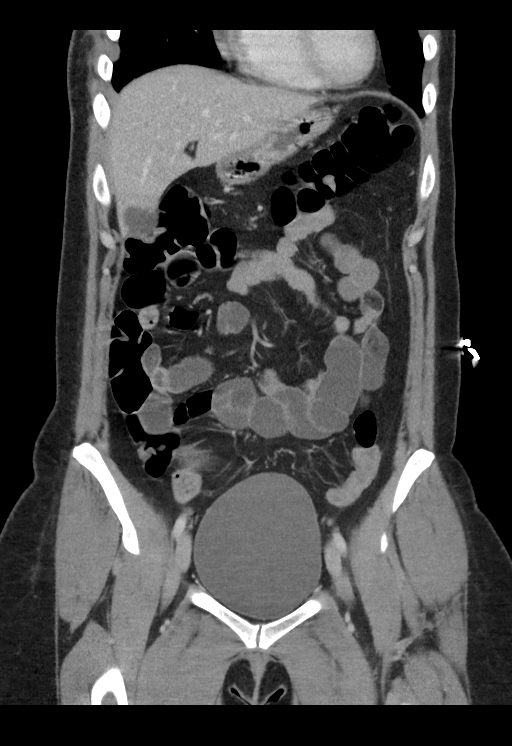
[im 31/69  soft-tissue]
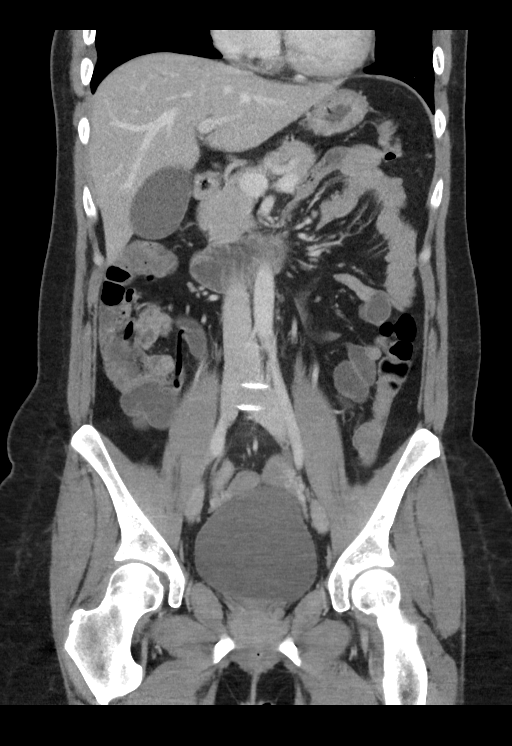
[im 38/69  soft-tissue]
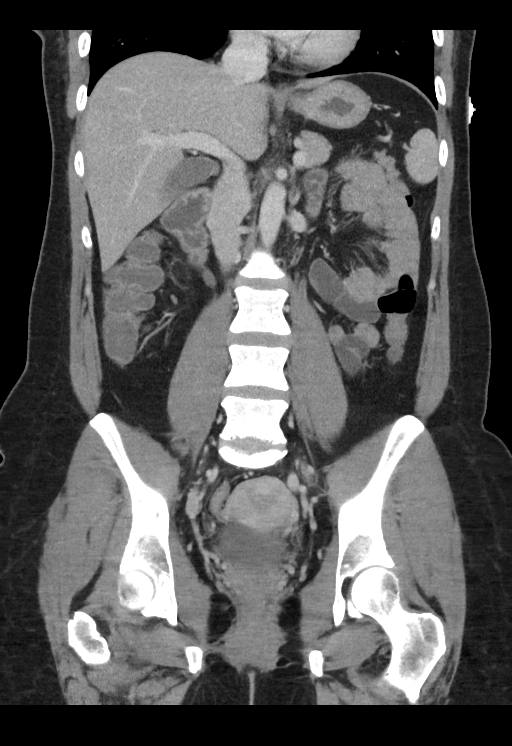

[16 of 46 positions shown; findings below may reference images not displayed]

FINDINGS: Lower chest: Minimal bibasilar atelectasis

Hepatobiliary: Tiny questionable low-attenuation focus RIGHT lobe
liver image 9. Gallbladder and liver otherwise normal appearance

Pancreas: Normal appearance

Spleen: Normal appearance

Adrenals/Urinary Tract: Small LEFT renal cyst. Adrenal glands,
kidneys, ureters, and bladder otherwise normal appearance

Stomach/Bowel: Normal appendix. Stomach and bowel loops normal
appearance for technique

Vascular/Lymphatic: Vascular structures unremarkable. No adenopathy.

Reproductive: Uterus and adnexa normal appearance

Other: No free air or free fluid. No acute inflammatory process
identified.

Musculoskeletal: RIGHT paracentral disc herniation at L5-S1 with
indenting thecal sac and abutting the RIGHT S1 nerve root.
IMPRESSION: No definite acute intra-abdominal or intrapelvic abnormalities.

RIGHT paracentral disc herniation at L5-S1 abutting the RIGHT S1
nerve root.

## 2018-12-20 NOTE — Progress Notes (Signed)
Oncology: Dr. Burr Medico Cardiology: Dr. Aundra Dubin  43 y.o. with history of breath cancer was referred by Dr. Burr Medico for cardio-oncology evaluation given planned Herceptin use.  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  She had bilateral mastectomies and axillary node biopsy in 7/18.  Planned for Taxol/carboplatin/Herceptin/Perjeta q 3 wks x 6 cycles starting 10/18, then maintenance Herceptin to complete a year.  She finished the initial chemotherapy and has finished radiation.   No history of cardiac problems.  Nonsmoker.   Echo in 12/18 showed significantly less negative strain though EF was preserved.  I had her stop Herceptin for a month and started Coreg 3.125 mg bid. Echo in 1/19 showed improved strain so Herceptin was restarted.  Echo in 5/19 showed fall in EF to 45-50% so Herceptin was held and she was started on Coreg and losartan. Echo in 7/19 showed that EF remained in the 45-50% range. Echo in 8/19 showed EF up to 55-60%, and Herceptin was restarted.   Echo was done today and reviewed, EF 93-71%, normal diastolic function, GLS -69.6%.   She returns for followup of cardiomyopathy.  BP is stable.  No lightheadedness or syncope. No significant exertional dyspnea.  No chest pain.  She will continue Herceptin to 4/20.    Labs (5/19): K 3.8, creatinine 0.84 => 0.94 Labs (7/19): creatinine 1.02  PMH: 1. Breast cancer: Diagnosed 7/18 on right, ER+/PR+/HER2+.  She had bilateral mastectomies and axillary node biopsy in 7/18.  Planned for Taxol/carboplatin/Herceptin/Perjeta q 3 wks x 6 cycles starting 10/12, then maintenance Herceptin to complete a year.  2. Herceptin-related cardiomyopathy:  - Echo (9/18): EF 78-93%, normal diastolic function, GLS -81.0%.  - Echo (12/18): EF 17-51%, normal diastolic function, normal RV size and systolic function, GLS -02.5%.  - Echo (1/19): EF 60%, GLS -15%, normal RV size and systolic function.  - Echo (5/19): EF 45-50%, GLS -17.2% - Echo (7/19): EF  45-50%, GLS -16% - Echo (8/19): EF 55-60%, GLS -19.4% - Echo (10/19): EF 55-60%, GLS -18.2%, normal RV size and systolic function - Echo (1/20): EF 85-27%, normal diastolic function, GLS -78.2%.  Family History  Problem Relation Age of Onset  . Breast cancer Paternal Grandmother 3  . Colon cancer Paternal Grandmother 60  . Breast cancer Mother 62  . Osteoporosis Mother   . Prostate cancer Paternal Grandfather        dx in his 57s  . Leukemia Paternal Grandfather   . Lung cancer Maternal Uncle        heavy smoker  . Other Paternal Uncle        farm accident  . Stroke Maternal Grandfather    Social History   Socioeconomic History  . Marital status: Single    Spouse name: Not on file  . Number of children: Not on file  . Years of education: Not on file  . Highest education level: Not on file  Occupational History  . Not on file  Social Needs  . Financial resource strain: Not on file  . Food insecurity:    Worry: Not on file    Inability: Not on file  . Transportation needs:    Medical: Not on file    Non-medical: Not on file  Tobacco Use  . Smoking status: Never Smoker  . Smokeless tobacco: Former Network engineer and Sexual Activity  . Alcohol use: Yes    Comment: once a year  . Drug use: No  . Sexual activity: Yes  Lifestyle  .  Physical activity:    Days per week: Not on file    Minutes per session: Not on file  . Stress: Not on file  Relationships  . Social connections:    Talks on phone: Not on file    Gets together: Not on file    Attends religious service: Not on file    Active member of club or organization: Not on file    Attends meetings of clubs or organizations: Not on file    Relationship status: Not on file  . Intimate partner violence:    Fear of current or ex partner: Not on file    Emotionally abused: Not on file    Physically abused: Not on file    Forced sexual activity: Not on file  Other Topics Concern  . Not on file  Social History  Narrative  . Not on file   ROS: All systems reviewed and negative except as per HPI.  Current Outpatient Medications  Medication Sig Dispense Refill  . anastrozole (ARIMIDEX) 1 MG tablet Take 1 tablet (1 mg total) by mouth daily. 30 tablet 11  . B Complex-C (B-COMPLEX WITH VITAMIN C) tablet Take 1 tablet by mouth daily.    . carvedilol (COREG) 3.125 MG tablet TAKE 1 TABLET BY MOUTH 2 TIMES DAILY. 60 tablet 5  . Cholecalciferol (VITAMIN D) 2000 units tablet Take 2,000 Units by mouth daily.    . cyclobenzaprine (FLEXERIL) 5 MG tablet Take 1 tablet (5 mg total) by mouth 2 (two) times daily as needed for muscle spasms. 30 tablet 0  . levothyroxine (SYNTHROID, LEVOTHROID) 25 MCG tablet Take 25 mcg by mouth daily before breakfast.    . losartan (COZAAR) 25 MG tablet TAKE 1/2 TABLET BY MOUTH AT BEDTIME. 15 tablet 5  . Melatonin 10 MG TABS Take 1 tablet by mouth.     . naproxen sodium (ANAPROX) 220 MG tablet Take 220 mg 2 (two) times daily as needed by mouth (pain).     . Omega-3 Fatty Acids (OMEGA-3 FISH OIL PO) Take 100 Units by mouth.    . venlafaxine XR (EFFEXOR-XR) 150 MG 24 hr capsule TAKE 1 CAPSULE (150 MG TOTAL) BY MOUTH DAILY WITH BREAKFAST. 90 capsule 2  . venlafaxine XR (EFFEXOR-XR) 75 MG 24 hr capsule TAKE 1 CAPSULE BY MOUTH DAILY WITH BREAKFAST.  2  . diphenoxylate-atropine (LOMOTIL) 2.5-0.025 MG tablet Take 1-2 tablets by mouth 4 (four) times daily as needed for diarrhea or loose stools. (Patient not taking: Reported on 12/18/2018) 30 tablet 0   Current Facility-Administered Medications  Medication Dose Route Frequency Provider Last Rate Last Dose  . gi cocktail (Maalox,Lidocaine,Donnatal)  30 mL Oral Once Tanner, Lucianne Lei E., PA-C       BP 104/70   Pulse (!) 102   Wt 75.8 kg (167 lb)   SpO2 99%   BMI 26.95 kg/m  General: NAD Neck: No JVD, no thyromegaly or thyroid nodule.  Lungs: Clear to auscultation bilaterally with normal respiratory effort. CV: Nondisplaced PMI.  Heart regular  S1/S2, no S3/S4, no murmur.  No peripheral edema.  No carotid bruit.  Normal pedal pulses.  Abdomen: Soft, nontender, no hepatosplenomegaly, no distention.  Skin: Intact without lesions or rashes.  Neurologic: Alert and oriented x 3.  Psych: Normal affect. Extremities: No clubbing or cyanosis.  HEENT: Normal.   Assessment/Plan:  1. Breast cancer: 1 year of Herceptin had been planned.  Her initial echo was normal. 12/18 echo showed that EF remained 60-65% but global longitudinal  strain measurement was significantly less negative, down to -13.5% from -20.2%.  I had her stop Herceptin for a month and she had a repeat echo in 1/19 with stable EF and more negative strain, Herceptin was restarted. In 5/19, EF was down to 45-50% so Herceptin held.  Repeat echo in 7/19 showed EF still 45-50%.  I suspect this was related to Herceptin.  Echo in 8/19 showed improved EF to 55-60% and improved strain.  Herceptin was restarted with plan to continue through 4/20.  Echo was reviewed today.  EF remains 60-65% with stable strain parameters.   - Repeat echo in 3 months with office visit.  - She will need to continue current Coreg and losartan (no BP room to titrate).   2. Sinus tachycardia: HR around 100 today.    Followup in 3 months with echo.   Loralie Champagne 12/20/2018

## 2018-12-21 IMAGING — CT CT HEAD WO/W CM
3 series · 16 of 47 positions shown, 19 images · IV contrast (APPLIED)
Comparison: None.

CLINICAL DATA: History of breast cancer on chemotherapy. Altered
mental status.

EXAM:
CT HEAD WITHOUT AND WITH CONTRAST
TECHNIQUE: Contiguous axial images were obtained from the base of the skull
through the vertex without and with intravenous contrast
CONTRAST:  100mL [id number] IOPAMIDOL (2C3LMG-KQ8) INJECTION 76%

[Series 2: head w · axial · 0.41mm/px · z∈[-77,+48]mm · 10 of 31 slices shown, 13 images]
[im 3/31  brain]
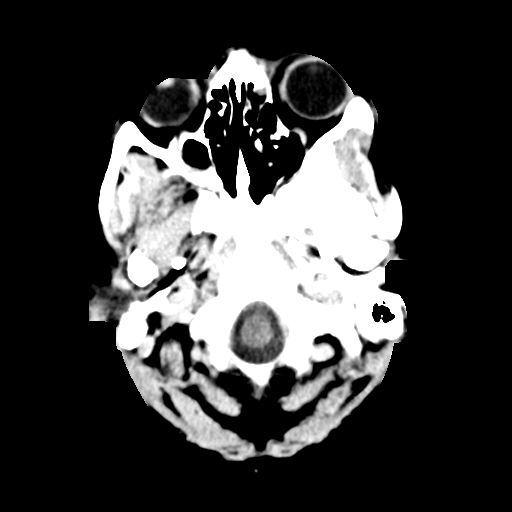
[im 3/31  bone]
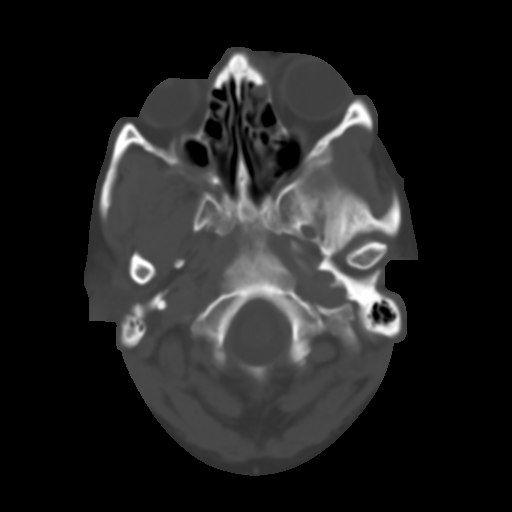
[im 6/31  brain]
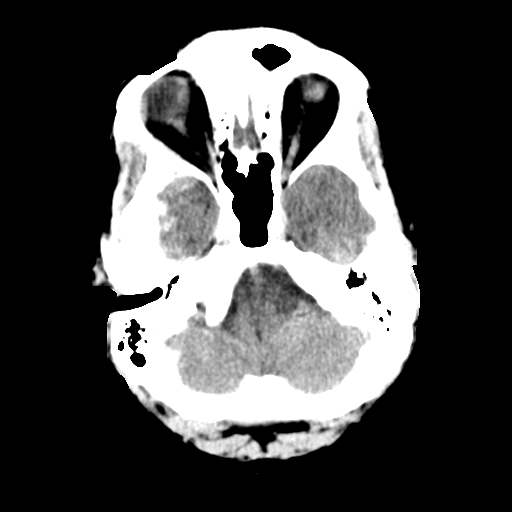
[im 9/31  brain]
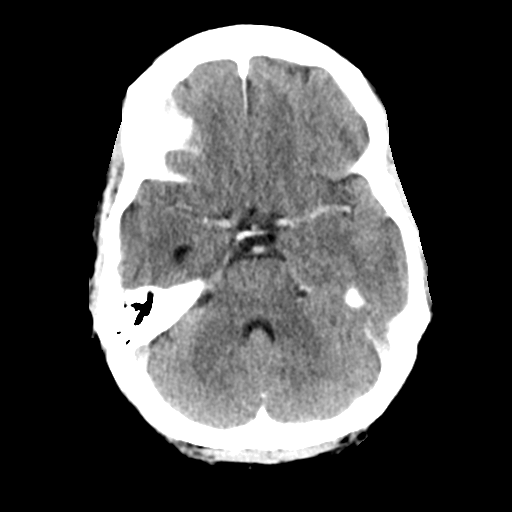
[im 11/31  brain]
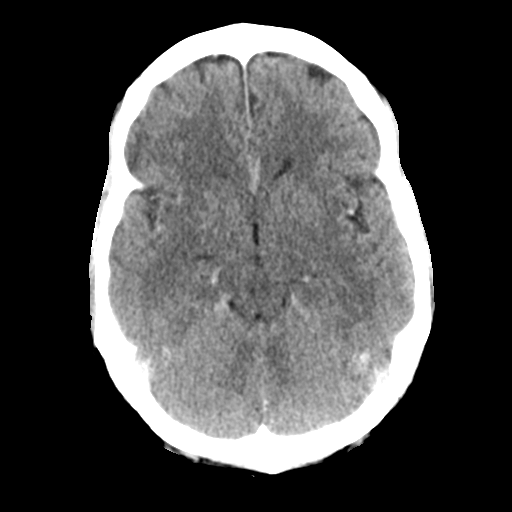
[im 14/31  brain]
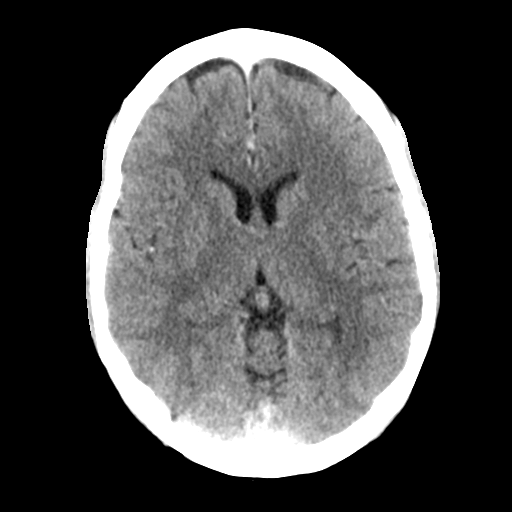
[im 14/31  bone]
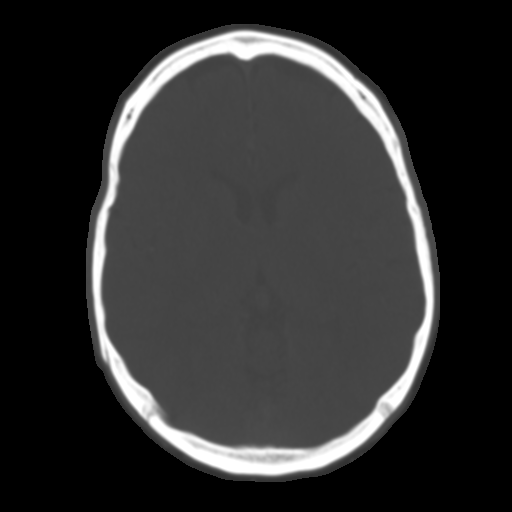
[im 17/31  brain]
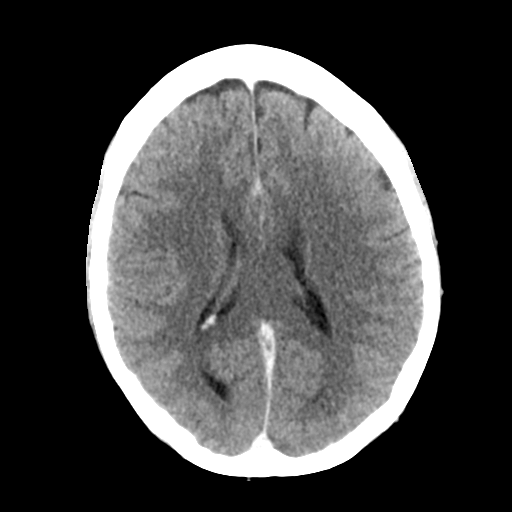
[im 20/31  brain]
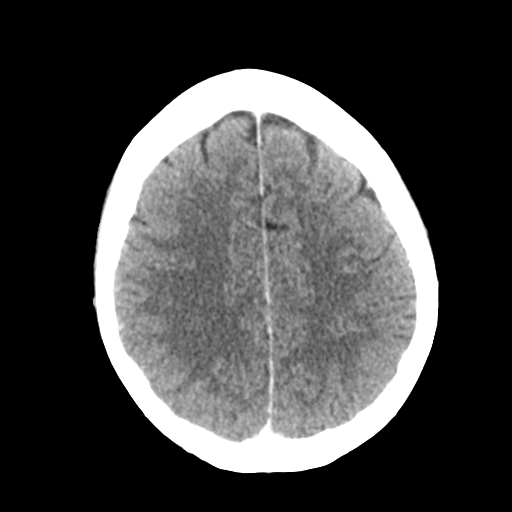
[im 23/31  brain]
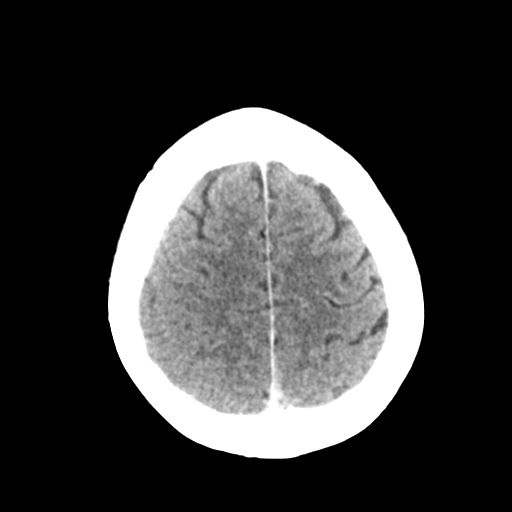
[im 25/31  brain]
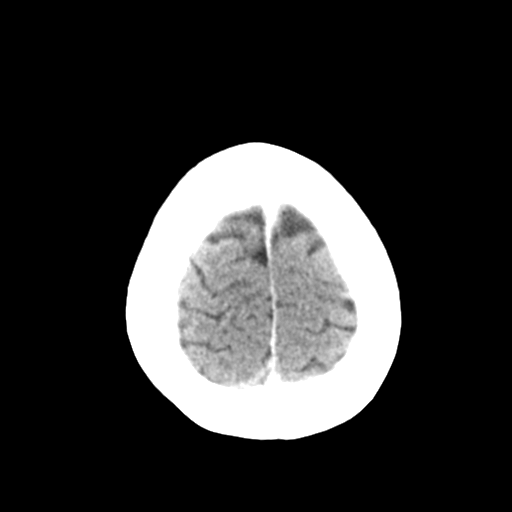
[im 25/31  bone]
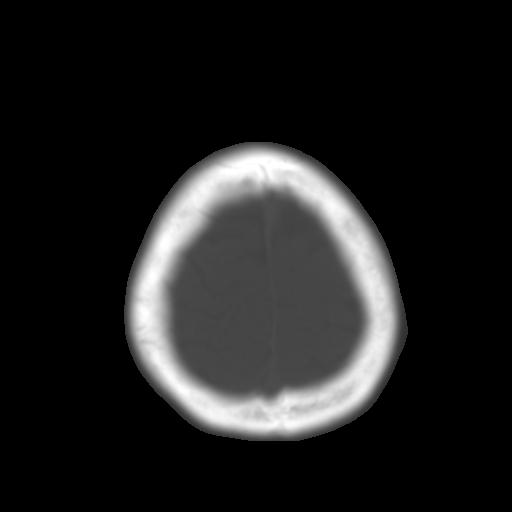
[im 28/31  brain]
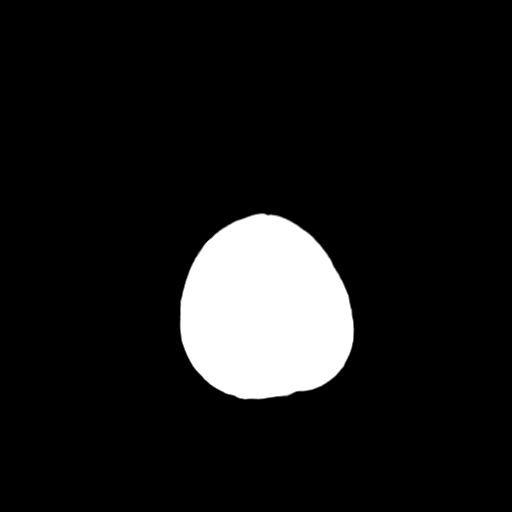

[Series 3: coronal soft tissue · coronal · 0.33mm/px · 3 of 69 slices shown]
[im 23/69  brain]
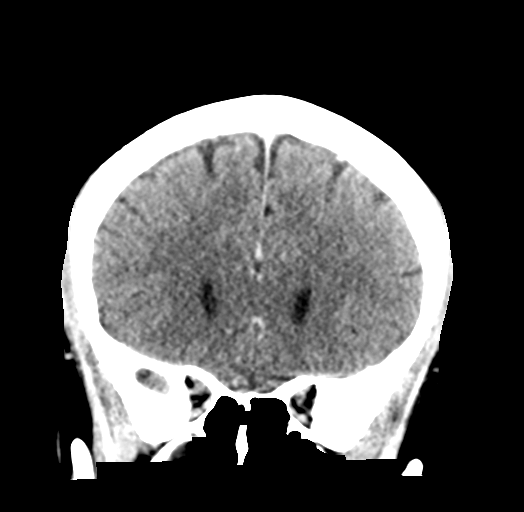
[im 31/69  brain]
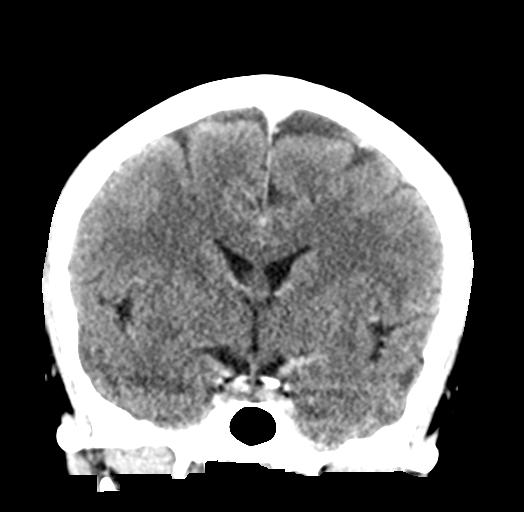
[im 38/69  brain]
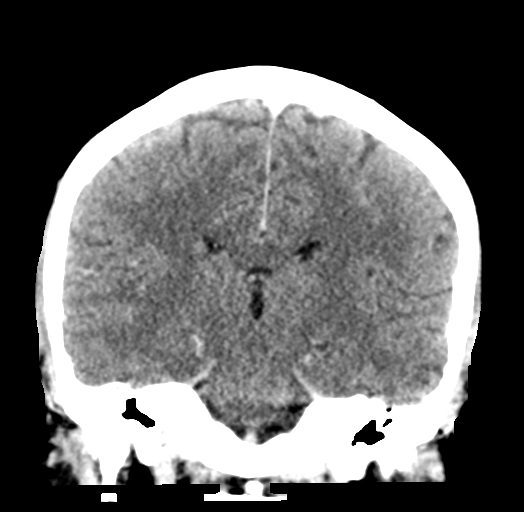

[Series 4: sagittal soft tissue · sagittal · 0.32mm/px · 3 of 66 slices shown]
[im 22/66  brain]
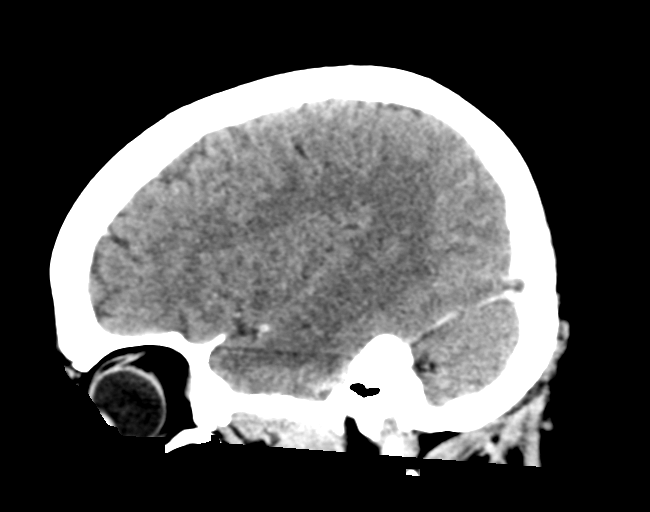
[im 33/66  brain]
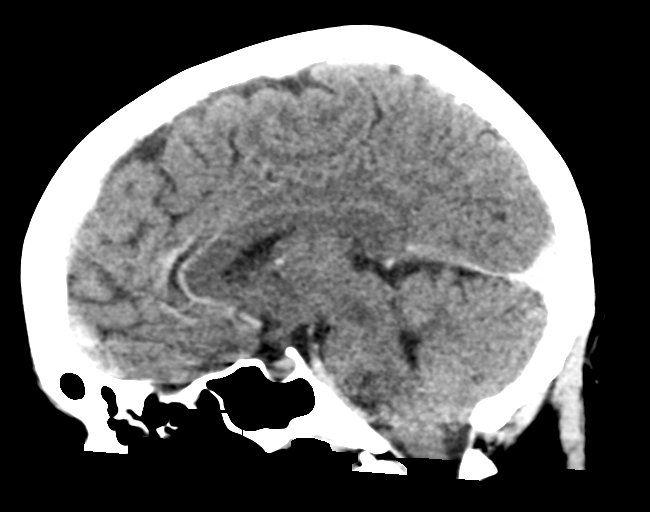
[im 44/66  brain]
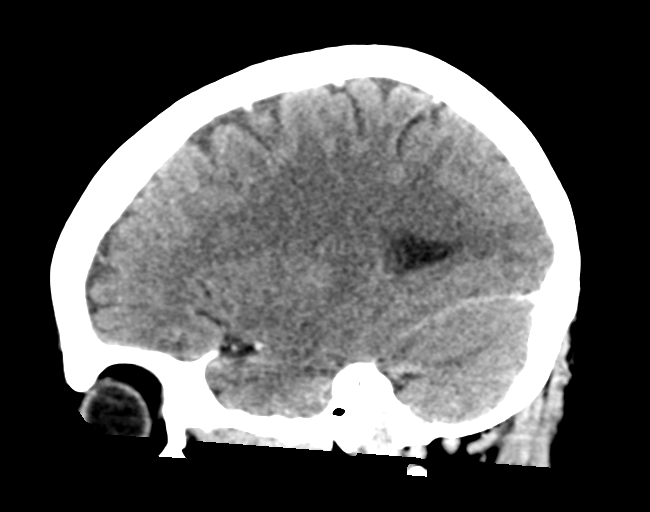

[16 of 47 positions shown; findings below may reference images not displayed]

FINDINGS: Brain: No mass lesion, intraparenchymal hemorrhage or extra-axial
collection. No evidence of acute cortical infarct. Brain parenchyma
and CSF-containing spaces are normal for age. No contrast-enhancing
lesions.

Vascular: No hyperdense vessel or unexpected calcification.

Skull: Normal visualized skull base, calvarium and extracranial soft
tissues.

Sinuses/Orbits: Moderate right maxillary sinus mucosal thickening.
Normal orbits.
IMPRESSION: No acute intracranial abnormality or enhancing lesion.

## 2018-12-22 NOTE — Progress Notes (Signed)
[facility] Counseling Session  The pt presented to session with an anxious mood and matching affect. The pt was alert and oriented.  The pt shared that in the last few days, she has felt "a loss of interest again." The pt initially expressed that she was not sure what this may be related to, but after a few minutes, she reported that she has been experiencing some financial stress that has made it difficult to focus on activities she enjoys. The pt also reported that at times her low energy levels can influence her experience of loss of interest as well.  The pt and counselor processed the pt's fears and frustrations related to her financial stress. The pt and counselor also processed next steps in addressing financial stress and integrating more meaning making into the pt's daily life. The pt reported that she planned to visit with one of the [facility] financial advocates following session today. The pt was also referred to [facility] support group beginning April 6.  The pt and counselor will meet again on Tuesday, February 11 at 12 pm.  Doris Cheadle, Counseling Intern [phone]

## 2018-12-24 ENCOUNTER — Telehealth: Payer: Self-pay | Admitting: *Deleted

## 2018-12-24 NOTE — Telephone Encounter (Signed)
Medical records faxed to [facility]; RI# [phone]

## 2018-12-29 NOTE — Progress Notes (Signed)
[facility] Counseling Session  The pt presented to session with a calm mood and matching affect. The pt was alert and oriented.   The pt shared that in the last week she has been doing "better." The pt related this to meeting with a [facility] [facility] financial advocate and gaining some relief from financial stress. The pt expressed that this relief has allowed her to focus her energy on other things in the last week. The pt identified a goal of "helping people." She shared "I want to make a difference." The pt identified that she felt a sense of loss and "being left behind" during her cancer treatment due to her life being "paused." The pt expressed that it is especially important to her now to find meaning in her daily life.  The pt and counselor processed the ways that the pt seems to be integrating meaning into her life. The pt reflected on the last year, sharing "I've been through a lot" and expressing a desire to help others, which she related to an awareness of suffering. The counselor validated the pt's work in integrating meaning into her life.  The pt and counselor will meet again on Tuesday, February 18 at 2 pm.  Doris Cheadle, Counseling Intern [phone]

## 2019-01-01 ENCOUNTER — Other Ambulatory Visit: Payer: BLUE CROSS/BLUE SHIELD

## 2019-01-01 ENCOUNTER — Inpatient Hospital Stay: Payer: BLUE CROSS/BLUE SHIELD | Attending: Family Medicine

## 2019-01-01 VITALS — BP 98/61 | HR 86 | Temp 97.5°F | Resp 17

## 2019-01-01 DIAGNOSIS — Z170 Estrogen receptor positive status [ER+]: Secondary | ICD-10-CM | POA: Insufficient documentation

## 2019-01-01 DIAGNOSIS — Z923 Personal history of irradiation: Secondary | ICD-10-CM | POA: Insufficient documentation

## 2019-01-01 DIAGNOSIS — Z5112 Encounter for antineoplastic immunotherapy: Secondary | ICD-10-CM | POA: Diagnosis not present

## 2019-01-01 DIAGNOSIS — C773 Secondary and unspecified malignant neoplasm of axilla and upper limb lymph nodes: Secondary | ICD-10-CM | POA: Diagnosis not present

## 2019-01-01 DIAGNOSIS — Z9221 Personal history of antineoplastic chemotherapy: Secondary | ICD-10-CM | POA: Diagnosis not present

## 2019-01-01 DIAGNOSIS — Z9013 Acquired absence of bilateral breasts and nipples: Secondary | ICD-10-CM | POA: Diagnosis not present

## 2019-01-01 DIAGNOSIS — C50411 Malignant neoplasm of upper-outer quadrant of right female breast: Secondary | ICD-10-CM | POA: Diagnosis not present

## 2019-01-01 DIAGNOSIS — Z95828 Presence of other vascular implants and grafts: Secondary | ICD-10-CM

## 2019-01-01 DIAGNOSIS — Z79811 Long term (current) use of aromatase inhibitors: Secondary | ICD-10-CM | POA: Insufficient documentation

## 2019-01-01 MED ORDER — SODIUM CHLORIDE 0.9 % IV SOLN
420.0000 mg | Freq: Once | INTRAVENOUS | Status: AC
  Administered 2019-01-01: 420 mg via INTRAVENOUS
  Filled 2019-01-01: qty 14

## 2019-01-01 MED ORDER — HEPARIN SOD (PORK) LOCK FLUSH 100 UNIT/ML IV SOLN
500.0000 [IU] | Freq: Once | INTRAVENOUS | Status: AC | PRN
  Administered 2019-01-01: 500 [IU]
  Filled 2019-01-01: qty 5

## 2019-01-01 MED ORDER — GOSERELIN ACETATE 3.6 MG ~~LOC~~ IMPL: 3.6000 mg | DRUG_IMPLANT | Freq: Once | SUBCUTANEOUS | Status: DC

## 2019-01-01 MED ORDER — ACETAMINOPHEN 325 MG PO TABS
650.0000 mg | ORAL_TABLET | Freq: Once | ORAL | Status: AC
  Administered 2019-01-01: 650 mg via ORAL

## 2019-01-01 MED ORDER — SODIUM CHLORIDE 0.9 % IV SOLN
Freq: Once | INTRAVENOUS | Status: AC
  Administered 2019-01-01: 10:00:00 via INTRAVENOUS
  Filled 2019-01-01: qty 250

## 2019-01-01 MED ORDER — SODIUM CHLORIDE 0.9% FLUSH
10.0000 mL | INTRAVENOUS | Status: DC | PRN
  Administered 2019-01-01: 10 mL
  Filled 2019-01-01: qty 10

## 2019-01-01 MED ORDER — DIPHENHYDRAMINE HCL 25 MG PO CAPS: 50.0000 mg | ORAL_CAPSULE | Freq: Once | ORAL | Status: DC

## 2019-01-01 MED ORDER — TRASTUZUMAB CHEMO 150 MG IV SOLR
450.0000 mg | Freq: Once | INTRAVENOUS | Status: AC
  Administered 2019-01-01: 450 mg via INTRAVENOUS
  Filled 2019-01-01: qty 21.43

## 2019-01-01 MED ORDER — ACETAMINOPHEN 325 MG PO TABS
ORAL_TABLET | ORAL | Status: AC
  Filled 2019-01-01: qty 2

## 2019-01-01 MED ORDER — GOSERELIN ACETATE 3.6 MG ~~LOC~~ IMPL
DRUG_IMPLANT | SUBCUTANEOUS | Status: AC
  Filled 2019-01-01: qty 3.6

## 2019-01-01 NOTE — Patient Instructions (Signed)
[facility] Discharge Instructions for Patients Receiving Chemotherapy  Today you received the following chemotherapy agents: Herceptin, Perjeta  To help prevent nausea and vomiting after your treatment, we encourage you to take your nausea medication as directed.   If you develop nausea and vomiting that is not controlled by your nausea medication, call the clinic.   BELOW ARE SYMPTOMS THAT SHOULD BE REPORTED IMMEDIATELY:  *FEVER GREATER THAN 100.5 F  *CHILLS WITH OR WITHOUT FEVER  NAUSEA AND VOMITING THAT IS NOT CONTROLLED WITH YOUR NAUSEA MEDICATION  *UNUSUAL SHORTNESS OF BREATH  *UNUSUAL BRUISING OR BLEEDING  TENDERNESS IN MOUTH AND THROAT WITH OR WITHOUT PRESENCE OF ULCERS  *URINARY PROBLEMS  *BOWEL PROBLEMS  UNUSUAL RASH Items with * indicate a potential emergency and should be followed up as soon as possible.  Feel free to call the clinic should you have any questions or concerns. The clinic phone number is [phone].  Please show the CHEMO ALERT CARD at check-in to the Emergency Department and triage nurse.   

## 2019-01-01 NOTE — Progress Notes (Signed)
Patient refused 30 min obs period after Perjeta

## 2019-01-04 ENCOUNTER — Inpatient Hospital Stay: Payer: BLUE CROSS/BLUE SHIELD

## 2019-01-04 VITALS — BP 98/66 | HR 100 | Temp 98.2°F | Resp 18

## 2019-01-04 DIAGNOSIS — Z923 Personal history of irradiation: Secondary | ICD-10-CM | POA: Diagnosis not present

## 2019-01-04 DIAGNOSIS — Z79811 Long term (current) use of aromatase inhibitors: Secondary | ICD-10-CM | POA: Diagnosis not present

## 2019-01-04 DIAGNOSIS — C50411 Malignant neoplasm of upper-outer quadrant of right female breast: Secondary | ICD-10-CM | POA: Diagnosis not present

## 2019-01-04 DIAGNOSIS — Z95828 Presence of other vascular implants and grafts: Secondary | ICD-10-CM

## 2019-01-04 DIAGNOSIS — Z170 Estrogen receptor positive status [ER+]: Secondary | ICD-10-CM

## 2019-01-04 DIAGNOSIS — Z9221 Personal history of antineoplastic chemotherapy: Secondary | ICD-10-CM | POA: Diagnosis not present

## 2019-01-04 DIAGNOSIS — Z5112 Encounter for antineoplastic immunotherapy: Secondary | ICD-10-CM | POA: Diagnosis not present

## 2019-01-04 DIAGNOSIS — C773 Secondary and unspecified malignant neoplasm of axilla and upper limb lymph nodes: Secondary | ICD-10-CM | POA: Diagnosis not present

## 2019-01-04 DIAGNOSIS — Z9013 Acquired absence of bilateral breasts and nipples: Secondary | ICD-10-CM | POA: Diagnosis not present

## 2019-01-04 MED ORDER — GOSERELIN ACETATE 3.6 MG ~~LOC~~ IMPL
3.6000 mg | DRUG_IMPLANT | Freq: Once | SUBCUTANEOUS | Status: AC
  Administered 2019-01-04: 3.6 mg via SUBCUTANEOUS

## 2019-01-04 MED ORDER — GOSERELIN ACETATE 3.6 MG ~~LOC~~ IMPL
DRUG_IMPLANT | SUBCUTANEOUS | Status: AC
  Filled 2019-01-04: qty 3.6

## 2019-01-05 NOTE — Progress Notes (Signed)
[facility] Counseling Session  The pt presented to session with a calm mood and matching affect. The pt was alert and oriented throughout session.  The pt expressed that she is seeking to incorporate more interpersonal engagement into her life that involves "talking with" rather than "talking to." The pt identified that talking with people involves deeper connection and sharing of personal experience. The pt expressed that since she kept her experience private during her cancer treatment, sharing it with others now can be uncomfortable, though she reports that it gives her a sense of purpose to support others going through cancer treatment.  The pt and counselor processed the ways that deeper interpersonal connection and helping others relate to meaning making in the pt's life. The pt and counselor will meet again on Tuesday, February 25 at 2 pm.  Doris Cheadle, Counseling Intern [phone]

## 2019-01-12 NOTE — Progress Notes (Signed)
[facility] Counseling Session  The pt presented to session with an anxious mood and matching affect. The pt was alert and oriented throughout session.  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 The pt identified some small steps toward her goals which she believes are possible to take in the next week, upon prompting of the counselor. When asked about what felt like barriers to taking these steps, the pt identified not having a neat, organized space to work in her home currently.  The pt and counselor explored ways to overcome the barrier the pt identified to her goals. The pt identified that creating a to do list and discussing weekly needs with her partner may be helpful. The counselor collaborated with the pt on how to integrate this practice into her daily life in the next week.  The pt and counselor will meet again on Tuesday, March 10 at 3 pm.  Doris Cheadle, Counseling Intern [phone]

## 2019-01-13 ENCOUNTER — Telehealth (HOSPITAL_COMMUNITY): Payer: Self-pay

## 2019-01-13 NOTE — Telephone Encounter (Signed)
Faxed medical records to disability determination services at [phone]. Original request form to be scanned in pt chart.

## 2019-01-19 ENCOUNTER — Telehealth (HOSPITAL_COMMUNITY): Payer: Self-pay

## 2019-01-19 ENCOUNTER — Other Ambulatory Visit (HOSPITAL_COMMUNITY): Payer: Self-pay

## 2019-01-19 MED ORDER — LOSARTAN POTASSIUM 25 MG PO TABS: 12.5000 mg | ORAL_TABLET | Freq: Every day | ORAL | 5 refills | Status: DC

## 2019-01-19 NOTE — Telephone Encounter (Signed)
Pt called to report that her pharmacy is out of losartan (cozaar) and they do not know when they will get any in stock. Pharmacy states it is on backorder. She would like to know if there is something else she can take to substitute the losartan? Please advise.

## 2019-01-20 NOTE — Telephone Encounter (Signed)
Spoke verbally about check with other pharmacies to see if they had in stock.  Any resolution??   Lollie Marrow, PA-C 01/20/2019 8:24 AM

## 2019-01-20 NOTE — Progress Notes (Signed)
Sheila [name]   [phone] [phone]) [phone]   Clinic Follow up Note   Patient Care Team: [name], MD as PCP - General (Family Medicine) [name] (Family Medicine) [name], MD as Consulting Physician (General Surgery) [name], MD as Consulting Physician (Hematology) Kyung Rudd, MD as Consulting Physician (Radiation Oncology)  Date of Service:  01/22/2019  CHIEF COMPLAINT: F/u of right breast cancer   SUMMARY OF ONCOLOGIC HISTORY: Oncology History    Cancer Staging Malignant neoplasm of upper-outer quadrant of right breast in female, estrogen receptor positive ([place]) Staging form: Breast, AJCC 8th Edition - Clinical stage from 06/11/2017: Stage IB (cT3, cN0, cM0, G2, ER: Positive, PR: Positive, HER2: Positive) - Signed by [name], MD on 06/16/2017 - Pathologic stage from 07/22/2017: Stage IB (pT3(m), pN2a(sn), cM0, G2, ER: Positive, PR: Positive, HER2: Positive) - Signed by [name], NP on 08/04/2017      Malignant neoplasm of upper-outer quadrant of right breast in female, estrogen receptor positive ([name])   06/11/2017 Initial Biopsy    Diagnosis 06/11/17 Breast, right, needle core biopsy, 11:00 o'clock - INVASIVE DUCTAL CARCINOMA, G2    06/11/2017 Mammogram    [name] and [facility] Diagnostic Breast Tomo Bilateral 06/11/17 IMPRESSION: 1. Highly suspicious mass within the retroareolar right breast, extending from the 8:00 to 11:00 axes, anterior to posterior depth, extending anteriorly to the nipple with associated nipple retraction, measuring at least 5.5 cm by ultrasound  2. No sonographic evidence of metastatic lymphadenopathy in the right axilla. 3. No evidence of malignancy within the left breast.     06/11/2017 Initial Diagnosis    Malignant neoplasm of upper-outer quadrant of right breast in female, estrogen receptor positive ([name])    06/11/2017 Receptors her2    Estrogen Receptor: 100%, POSITIVE, STRONG STAINING  INTENSITY Progesterone Receptor: 100%, POSITIVE, STRONG STAINING INTENSITY Proliferation Marker Ki67: 12%Proliferation Marker Ki67: 12%  HER2 - **POSITIVE** RATIO OF HER2/CEP17 SIGNALS 2.55 AVERAGE HER2 COPY NUMBER PER CELL 4.85    06/25/2017 Genetic Testing    Negative genetic testing on the 9 gene STAT panel.  The STAT Breast cancer panel offered by [facility] includes sequencing and rearrangement analysis for the following 9 genes:  ATM, BRCA1, BRCA2, CDH1, CHEK2, PALB2, PTEN, STK11 and TP53.   The report date is June 25, 2017.  Negative genetic testing on the common hereditary cancer panel.  The Hereditary Gene Panel offered by [facility] includes sequencing and/or deletion duplication testing of the following 46 genes: APC, ATM, AXIN2, BARD1, BMPR1A, BRCA1, BRCA2, BRIP1, CDH1, CDKN2A (p14ARF), CDKN2A (p16INK4a), CHEK2, CTNNA1, DICER1, EPCAM (Deletion/duplication testing only), GREM1 (promoter region deletion/duplication testing only), KIT, MEN1, MLH1, MSH2, MSH3, MSH6, MUTYH, NBN, NF1, NHTL1, PALB2, PDGFRA, PMS2, POLD1, POLE, PTEN, RAD50, RAD51C, RAD51D, SDHB, SDHC, SDHD, SMAD4, SMARCA4. STK11, TP53, TSC1, TSC2, and VHL.  The following genes were evaluated for sequence changes only: SDHA and HOXB13 c.251G>A variant only.  The report date is June 25, 2017.     07/02/2017 Imaging    CT cAP 07/02/17 IMPRESSION: 1. Subareolar right breast mass. No compelling findings of nodal or metastatic involvement. 2. Old granulomatous disease. 3. There is a 7 mm enhancing or hyperdense focus posteriorly in segment 7 of the liver in the subcapsular region. By virtue of its small size this lesion is technically nonspecific although statistically likely to be a small benign lesions such as flash filling hemangioma. This may warrant surveillance. 4.  Prominent stool throughout the colon favors constipation. 5. Degenerative disc  disease and spondylosis at L5-S1 likely causing mild impingement.    07/02/2017  Imaging    Bone scan 07/02/17 IMPRESSION: Today' s exam is negative. I ascribed the tiny focus of activity just proximal to the left antecubital region to injection site.    07/22/2017 Surgery    RIGHT TOTAL MASTECTOMY WITH RIGHT AXILLARY SENTINEL LYMPH NODE BIOPSY, LEFT PROPHYLACTIC MASTECTOM and INSERTION PORT-A-CATH WITH [name] by [name] and [facility]     07/22/2017 Pathology Results    Diagnosis 1. Breast, simple mastectomy, Left - FIBROADENOMA. - NO MALIGNANCY IDENTIFIED. 2. 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 - ADDITIONAL FOCUS OF LOBULAR CARCINOMA, GRADE 2, SPANNING 2.2 CM. - INTERMEDIATE GRADE DUCTAL CARCINOMA IN SITU. - INVASIVE CARCINOMA COMES TO WITHIN 0.2 TO 0.3 CM OF THE DEEP MARGIN, FOCALLY. - TUMOR FOCALLY INVOLVES EPIDERMIS. - LYMPHOVASCULAR INVASION OF DERMAL LYMPHATICS. - ONE OF ONE LYMPH NODES NEGATIVE FOR CARCINOMA (0/1). - SEE ONCOLOGY TABLE. 3. Lymph node, sentinel, biopsy, Right axillary - ONE LYMPH NODE WITH ISOLATED TUMOR CELLS (0/1). 4. Lymph node, sentinel, biopsy, Right - METASTATIC CARCINOMA IN ONE OF ONE LYMPH NODES (1/1). 5. Lymph node, sentinel, biopsy, Right - ONE OF ONE LYMPH NODES NEGATIVE FOR CARCINOMA (0/1). 6. Lymph node, sentinel, biopsy, Right - METASTATIC CARCINOMA IN ONE OF ONE LYMPH NODES (1/1). - EXTRACAPSULAR EXTENSION. 7. Lymph node, sentinel, biopsy, Right - METASTATIC CARCINOMA IN ONE OF ONE LYMPH NODES (1/1). - EXTRACAPSULAR EXTENSION. 8. Lymph node, sentinel, biopsy, Right - ONE OF ONE LYMPH NODES NEGATIVE FOR CARCINOMA (0/1). 9. Lymph node, sentinel, biopsy, Right - ONE OF ONE LYMPH NODES NEGATIVE FOR CARCINOMA (0/1). 10. Lymph node, sentinel, biopsy, Right - METASTATIC CARCINOMA IN ONE OF ONE LYMPH NODES (1/1). - EXTRACAPSULAR EXTENSION. 11. Lymph node, biopsy, Right axillary - ONE OF ONE LYMPH NODES NEGATIVE FOR CARCINOMA (0/1).     07/22/2017 Receptors  her2    Ductal carcinoma in primary breast tumor and node metastasis are ER 95%, PR 95% strongly positive, and HER2 + The lobular component in primary breast tumor is ER 95% positive, PR 95% positive, strong staining, HER-2 negative.   Original biopsy [id number]: Ductal morphology, Her2 FISH positive. Current specimen: Main focus is 75% ductal morphology, 25% lobular morphology. Smaller focus is lobular morphology. #2A Lobular morphology, Her IHC negative. #2E Lobular morphology, Her2 FISH negative, Her2 IHC negative. #4 50% lobular and 50% ductal morphology, Her2 FISH positive, Her2 IHC negative. #6 Ductal morphology #7 Ductal morphology #10 50% lobular, 50% ductal morphology    08/25/2017 Imaging    MRI abdomen done at [facility]:  Enhancing structure measing 5 mm is compatible with a flash filling hemangioma. No specific findings to suggest metastatic cancer    08/25/2017 Imaging    MRI Abdomen at [facility] 08/25/17  IMPRESSION: 1. Lesion in segment 7 of the liver has signal and enhancement characteristics compatible with a flash filling hemangioma. No specific findings identified to suggest metastatic disease.    08/29/2017 - 12/12/2017 Chemotherapy    adjuvant TCHP every 3 weeks for 6 cycles starting 08/29/17. Postponed and decreased Carbo to 661m and added Onpro with cycle 2 on 09/19/17 due to neutropenic fever. Carbplatin further reduced to 5020mstarting with cycle 3 due to thrombocytopenia. Herceptin was held for Cycle 4 and 5 due to worsening global longitudinal strain found on ECHO from 11/07/17. Completed 6 cycles on 12/12/17       09/04/2017 - 09/09/2017 Hospital Admission  Admit date: 09/04/2017 Discharge date: 09/09/2017  DISCHARGE DIAGNOSES:  Principal Problem:   Sepsis [facility]) Active Problems:   Malignant neoplasm of upper-outer quadrant of right breast in female, estrogen receptor positive ([place])   Port-A-Cath in place   Neutropenic fever ([place])   Hyponatremia    Malignant neoplasm of breast in female, estrogen receptor positive ([place])     12/28/2017 Imaging    CT CHEST IMPRESSION: 1. 2.3 x 2.7 x 1.2 cm irregular masslike opacity within the right middle lobe worrisome for malignancy/metastasis. 2. 3 new very small sclerotic lesions, within the sternum and the body of T4, worrisome for new metastases.    12/28/2017 - 01/05/2018 Hospital Admission    Admit date: 12/28/17-01/05/18 Admission diagnosis: multifocal pneumonia, influenza A, sepsis, hypoxia Additional comments: imaging during hospitalization notable for new irregular masslike opacity in the right middle lobe worrisome for metastasis and new very small sclerotic lesion within the sternum and the body of T4, worrisome for metastasis    12/29/2017 Imaging    CT ABD/PELVIS IMPRESSION: No definite acute intra-abdominal or intrapelvic abnormalities.  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    12/30/2017 Imaging    CT HEAD IMPRESSION: No acute intracranial abnormality or enhancing lesion.    01/16/2018 - 01/22/2019 Chemotherapy    Maintence Herceptin and Perjeta every 3 weeks for 6-12 months starting 01/16/18. Held 03/2018-07/17/18 due to low EF. Completed on 01/22/19     02/04/2018 PET scan    IMPRESSION: 1. The previous consolidation in the right middle lobe currently has a more nodular and reticular appearance with some associated mild atelectasis along its anterior margin. This demonstrates low-grade metabolic activity with maximum SUV 3.2 (background blood pool activity is 2.7). Although conceivably related to low-grade residuum from the patient's malignancy, the presence of calcified right hilar lymph nodes and small calcifications along the nodularity raise suspicion for a low-grade active granulomatous process or atypical infectious process. Surveillance is recommended. 2. Scattered deposits of hypermetabolic but benign metabolically active brown fat.      02/23/2018 - 04/09/2018 Radiation Therapy    Radiation treatment dates:   02/23/2018 - 04/09/2018  Site/dose:   The patient initially received a dose of 50.4 Gy in 28 fractions to the right chest wall and supraclavicular region. This was delivered using a 3-D conformal, 4 field technique. The patient then received a boost to the mastectomy scar. This delivered an additional 10 Gy in 5 fractions using an en face electron field. The total dose was 60.4 Gy.  Narrative: The patient tolerated radiation treatment relatively well.   The patient had some expected skin irritation as she progressed during treatment. Moist desquamation was not present at the end of treatment.    03/20/2018 -  Anti-estrogen oral therapy    -Monthly Zoladex injection starting 03/20/18  -Letrozole 2.'5mg'$  once dialy starting 05/03/18  -Switched to Anastrozole starting 07/2018 Due to her worsening back pain.    04/06/2018 Imaging    CT Chest wo contrast IMPRESSION: 1. Stable linear nodular thickening in the RIGHT middle lobe is favored post infectious or inflammatory scarring. 2. Calcified RIGHT hilar lymph node is consistent post infectious process. 3. Stable small LEFT upper lobe nodule. 4. Stable sclerotic lesion in the manubrium. Sclerotic lesion at T3 is more prominent (4 mm). Sclerotic lesion at L 1 is new from 07/02/2017.    04/06/2018 Imaging    04/06/2018 CT Chest IMPRESSION: 1. Stable linear nodular thickening in the RIGHT middle lobe is  favored post infectious or inflammatory scarring. 2. Calcified RIGHT hilar lymph node is consistent post infectious process. 3. Stable small LEFT upper lobe nodule. 4. Stable sclerotic lesion in the manubrium. Sclerotic lesion at T3 is more prominent (4 mm). Sclerotic lesion at L 1 is new from 07/02/2017.    04/10/2018 Echocardiogram    LV EF 45%-50%    05/25/2018 Echocardiogram    LV EF 45%-50%    07/13/2018 Echocardiogram    Normal LV size with EF 55-60%    07/31/2018  Imaging    07/31/2018 Bone Scan IMPRESSION: No evidence of skeletal metastatic disease.    07/31/2018 Imaging    07/31/2018 CT CAP IMPRESSION: Residual linear and nodular peribronchovascular opacities in the right middle lobe, with other scattered areas of ground-glass opacities in the right middle, right upper and right lower lobes, which are located within the previously demonstrated bulky airspace consolidation. The findings are improved from the chest CT dated 12/30/2017, and relatively stable from PET-CT dated 02/04/2018. These may represent post infectious/post inflammatory changes or residual malignancy.  Stable partially calcified right hilar lymphadenopathy.  Interval development of nodular soft tissue thickening within the anterior mediastinum in the area of the thymus, new from chest CT dated 12/30/2017. This may represent reactive thymic tissue, thymoma or lymphadenopathy.  1.8 cm probably physiologic left ovarian cyst. Borderline thickening of the endometrium measuring 15.6 mm. Further evaluation with pelvic ultrasound may be considered.  No new or suspicious findings within the abdomen.    09/15/2018 Echocardiogram    EF 55-60%      CURRENT THERAPY:   -Monthly Zoladex injections starting 03/20/18  -Letrozole 2.'5mg'$  once daily starting 05/03/18, changed to anastrozole in 07/2018  INTERVAL HISTORY:  Kery Haltiwanger is here for a follow up and treatment. She presents to the clinic today with her husband. She notes she is doing well. For there last 2 weeks she has been '225mg'$  Effexor a day. This has improved her hot flashes.  She notes thoracic to lumber back pain. Exacerbated by movement and mainly at night when laying down or when sitting down for a long period of time. This pain does not radiate. She has been taking Flexeril for significant pain. She is not active when the pain is active.    REVIEW OF SYSTEMS:   Constitutional: Denies fevers, chills or  abnormal weight loss (+) Improved hot flashes  Eyes: Denies blurriness of vision Ears, nose, mouth, throat, and face: Denies mucositis or sore throat Respiratory: Denies cough, dyspnea or wheezes Cardiovascular: Denies palpitation, chest discomfort or lower extremity swelling Gastrointestinal:  Denies nausea, heartburn or change in bowel habits Skin: Denies abnormal skin rashes MSK: (+) Lower thoracic-Lumber back pain  Lymphatics: Denies new lymphadenopathy or easy bruising Neurological:Denies numbness, tingling or new weaknesses Behavioral/Psych: Mood is stable, no new changes  All other systems were reviewed with the patient and are negative.  MEDICAL HISTORY:  Past Medical History:  Diagnosis Date  . Anxiety   . Cancer [facility])    right breast   . Cardiomyopathy ([place])   . Family history of breast cancer   . Family history of colon cancer   . GERD (gastroesophageal reflux disease)   . Headache    couple of migraines in the past  . History of kidney stones   . Vitamin D deficiency     SURGICAL HISTORY: Past Surgical History:  Procedure Laterality Date  . egg donation    . kidney stone removal    . MASTECTOMY  W/ SENTINEL NODE BIOPSY Bilateral 07/22/2017   Procedure: RIGHT TOTAL MASTECTOMY WITH RIGHT AXILLARY SENTINEL LYMPH NODE BIOPSY, LEFT PROPHYLACTIC MASTECTOMY;  Surgeon: [name], MD;  Location: [place];  Service: General;  Laterality: Bilateral;  . PORTACATH PLACEMENT Right 07/22/2017   Procedure: INSERTION PORT-A-CATH WITH [name];  Surgeon: [name], MD;  Location: [place];  Service: General;  Laterality: Right;    I have reviewed the social history and family history with the patient and they are unchanged from previous note.  ALLERGIES:  has No Known Allergies.  MEDICATIONS:  Current Outpatient Medications  Medication Sig Dispense Refill  . anastrozole (ARIMIDEX) 1 MG tablet Take 1 tablet (1 mg total) by mouth daily. 30 tablet 11  . B Complex-C (B-COMPLEX  WITH VITAMIN C) tablet Take 1 tablet by mouth daily.    . carvedilol (COREG) 3.125 MG tablet TAKE 1 TABLET BY MOUTH 2 TIMES DAILY. 60 tablet 5  . Cholecalciferol (VITAMIN D) 2000 units tablet Take 2,000 Units by mouth daily.    . cyclobenzaprine (FLEXERIL) 5 MG tablet Take 1 tablet (5 mg total) by mouth 2 (two) times daily as needed for muscle spasms. 30 tablet 0  . diphenoxylate-atropine (LOMOTIL) 2.5-0.025 MG tablet Take 1-2 tablets by mouth 4 (four) times daily as needed for diarrhea or loose stools. 30 tablet 0  . levothyroxine (SYNTHROID, LEVOTHROID) 25 MCG tablet Take 25 mcg by mouth daily before breakfast.    . losartan (COZAAR) 25 MG tablet Take 0.5 tablets (12.5 mg total) by mouth at bedtime. 15 tablet 5  . Melatonin 10 MG TABS Take 0.5 tablets by mouth.     . naproxen sodium (ANAPROX) 220 MG tablet Take 220 mg 2 (two) times daily as needed by mouth (pain).     . Omega-3 Fatty Acids (OMEGA-3 FISH OIL PO) Take 100 Units by mouth.    . venlafaxine XR (EFFEXOR-XR) 75 MG 24 hr capsule Take 3 capsules (225 mg total) by mouth daily with breakfast. 90 capsule 5   Current Facility-Administered Medications  Medication Dose Route Frequency Provider Last Rate Last Dose  . gi cocktail (Maalox,Lidocaine,Donnatal)  30 mL Oral Once Harle Stanford., PA-C       Facility-Administered Medications Ordered in Other Visits  Medication Dose Route Frequency Provider Last Rate Last Dose  . diphenhydrAMINE (BENADRYL) capsule 50 mg  50 mg Oral Once [name], MD      . sodium chloride flush (NS) 0.9 % injection 10 mL  10 mL Intracatheter PRN [name], MD   10 mL at 01/22/19 1559    PHYSICAL EXAMINATION: ECOG PERFORMANCE STATUS: 1 - Symptomatic but completely ambulatory  Vitals:   01/22/19 1318  BP: 92/62  Pulse: 93  Resp: 18  Temp: 98.4 F (36.9 C)  SpO2: 100%   Filed Weights   01/22/19 1318  Weight: 165 lb 9.6 oz (75.1 kg)    GENERAL:alert, no distress and comfortable SKIN: skin color,  texture, turgor are normal, no rashes or significant lesions EYES: normal, Conjunctiva are pink and non-injected, sclera clear OROPHARYNX:no exudate, no erythema and lips, buccal mucosa, and tongue normal  NECK: supple, thyroid normal size, non-tender, without nodularity LYMPH:  no palpable lymphadenopathy in the cervical, axillary or inguinal LUNGS: clear to auscultation and percussion with normal breathing effort HEART: regular rate & rhythm and no murmurs and no lower extremity edema ABDOMEN:abdomen soft, non-tender and normal bowel sounds Musculoskeletal:no cyanosis of digits and no clubbing (+) Lower thoracic spine tenderness NEURO: alert &  oriented x 3 with fluent speech, no focal motor/sensory deficits  LABORATORY DATA:  I have reviewed the data as listed CBC Latest Ref Rng & Units 01/22/2019 12/11/2018 10/30/2018  WBC 4.0 - 10.5 K/uL 3.1(L) 2.9(L) 3.3(L)  Hemoglobin 12.0 - 15.0 g/dL 11.6(L) 11.7(L) 11.0(L)  Hematocrit 36.0 - 46.0 % 34.2(L) 34.2(L) 33.7(L)  Platelets 150 - 400 K/uL 164 181 158     CMP Latest Ref Rng & Units 01/22/2019 12/11/2018 10/30/2018  Glucose 70 - 99 mg/dL 118(H) 107(H) 73  BUN 6 - 20 mg/dL '12 11 10  '$ Creatinine 0.44 - 1.00 mg/dL 1.03(H) 1.01(H) 0.99  Sodium 135 - 145 mmol/L 137 139 140  Potassium 3.5 - 5.1 mmol/L 4.0 4.3 4.3  Chloride 98 - 111 mmol/L 107 105 106  CO2 22 - 32 mmol/L '22 25 26  '$ Calcium 8.9 - 10.3 mg/dL 8.7(L) 9.3 9.2  Total Protein 6.5 - 8.1 g/dL 7.3 7.1 7.0  Total Bilirubin 0.3 - 1.2 mg/dL 0.4 0.5 0.3  Alkaline Phos 38 - 126 U/L 101 107 109  AST 15 - 41 U/L '21 16 17  '$ ALT 0 - 44 U/L '16 16 17      '$ RADIOGRAPHIC STUDIES: I have personally reviewed the radiological images as listed and agreed with the findings in the report. No results found.   ASSESSMENT & PLAN:  Sheila [name] is a 43 y.o. female with   1. Malignant neoplasm of upper-outer quadrant of right breast in female, mixed invasive ductal and lobular carcinoma, pT3(m)N2aM0,  stage 1b, ductal carcinoma triple positive, lobular carcinoma ER+/PR+/HER2-, Grade 2 -She was diagnosed in 05/2017. She is s/p b/l mastectomy, adjuvant TCHP and radiation.  -Her genetic testing was negative.  -She is currently being treatedwithmaintenance Herceptin/Perjeta and antiestrogen therapy with monthly Zoladex injections and daily anastrozole.  -Plan to complete Herceptin/Perjeta 01/22/19, some delay due to her decreased EF on f/u which has improved lately.  -She has cancelled her BSO for now,and wants to have the surgery later this year. Shewill continue Zoladex injections monthly -11/2018 ECHO showed EF of 60-65% and overall normal. Will repeat in 02/2019.  -Labs reviewed, CBC WNL except WBC at 3.1, HG at 11.6 and ANC at 1.4, CMP WNL. Overall adequate to proceed with last cycle Herceptin/Perjeta today  -due to her locally advanced disease, I recommend further anti-HER2 treatment with oral Nerlynx. Will give treatment break and start next month  -Next Zoladex injection next week and continue monthly -Continue Anastrozole  -F/u in 5 weeks  2. Insomnia  -She has history of insomnia failed multiple agents such as lunesta, trazodone, tylenol pm, and xanax and only had mild relief with Remeron. -She previously was able to ween off Ativan andAmbienwhich improved her sleep. -She currently takeshigh doseMelatonin to help her sleep, is not very effective. -Continues to improve. Not mentioned today, likely resolved.   3. Hot flash.  -Now mild and tolerable. ContinueEffexor'225mg'$  daily. Refilled today (01/22/19)  4. Bone Health -Continue Vitamin D supplement and Vitamin C rich food -We will order a bone density scan in 2020   5. DiffuseBack pain -Her CT Chest on 04/06/18 showed sclerotic lesions of spine in T3 and L1, buther bone scan in 07/2018 was negative  -Letrozole was changed to anastrozole in September 2019due to arthralgia -Her back pain returned recently mainly in  Lower thoracic to lumbar region. She currently takes Flexeril and aleve for pain.  -Will do MRI of lumbar spine in 2 weeks to further evaluate and rule out metastasis.  6. Lung lesions  -She was found to have masslike opacity in the right middle lobe when she was admitted for neutropenic fever in February 2019, it got progressively worse during the hospital stay, felt to be related to pneumonia, on subsequent follow-up scan, this has much improved, but still has some residual nodule opacity and groundglass opacity in the right lung on the last CT scan in September 2019. -We will repeat likely last CT chest before next visit.    PLAN: -Refilled Effexor today  -Labs reviewed and adequate for last Herceptin/Perjeta today  -Continue Anastrozole and monthly Zoladex injection -CT CHEST wo contrast and MRI lumbar spine wo contrast in 2 weeks, I will call her after the scan -Lab, flush and f/u and injection on 4/10      No problem-specific Assessment & Plan notes found for this encounter.   Orders Placed This Encounter  Procedures  . CT Chest Wo Contrast    Standing Status:   Future    Standing Expiration Date:   01/22/2020    Order Specific Question:   Is patient pregnant?    Answer:   No    Order Specific Question:   Preferred imaging location?    Answer:   [facility]    Order Specific Question:   Radiology Contrast Protocol - do NOT remove file path    Answer:   \\[email or web address]  . MR Lumbar Spine Wo Contrast    Worsening mid to low back pain, history of breast cancer    Standing Status:   Future    Standing Expiration Date:   01/22/2020    Order Specific Question:   What is the patient's sedation requirement?    Answer:   No Sedation    Order Specific Question:   Does the patient have a pacemaker or implanted devices?    Answer:   No    Order Specific Question:   Preferred imaging location?    Answer:   [facility] (table  limit-350 lbs)    Order Specific Question:   Radiology Contrast Protocol - do NOT remove file path    Answer:   \\[email or web address]   All questions were answered. The patient knows to call the clinic with any problems, questions or concerns. No barriers to learning was detected. I spent 20 minutes counseling the patient face to face. The total time spent in the appointment was 25 minutes and more than 50% was on counseling and review of test results     [name], MD 01/22/2019   I, Joslyn Devon, am acting as scribe for [name], MD.   I have reviewed the above documentation for accuracy and completeness, and I agree with the above.

## 2019-01-22 ENCOUNTER — Inpatient Hospital Stay: Payer: BLUE CROSS/BLUE SHIELD | Attending: Nurse Practitioner

## 2019-01-22 ENCOUNTER — Inpatient Hospital Stay: Payer: BLUE CROSS/BLUE SHIELD

## 2019-01-22 ENCOUNTER — Ambulatory Visit: Payer: BLUE CROSS/BLUE SHIELD

## 2019-01-22 ENCOUNTER — Inpatient Hospital Stay (HOSPITAL_BASED_OUTPATIENT_CLINIC_OR_DEPARTMENT_OTHER): Payer: BLUE CROSS/BLUE SHIELD | Admitting: Hematology

## 2019-01-22 ENCOUNTER — Other Ambulatory Visit: Payer: BLUE CROSS/BLUE SHIELD

## 2019-01-22 ENCOUNTER — Encounter: Payer: Self-pay | Admitting: Hematology

## 2019-01-22 ENCOUNTER — Telehealth: Payer: Self-pay | Admitting: Hematology

## 2019-01-22 ENCOUNTER — Ambulatory Visit: Payer: BLUE CROSS/BLUE SHIELD | Admitting: Hematology

## 2019-01-22 VITALS — BP 92/62 | HR 93 | Temp 98.4°F | Resp 18 | Ht 66.0 in | Wt 165.6 lb

## 2019-01-22 DIAGNOSIS — Z923 Personal history of irradiation: Secondary | ICD-10-CM | POA: Insufficient documentation

## 2019-01-22 DIAGNOSIS — Z170 Estrogen receptor positive status [ER+]: Principal | ICD-10-CM

## 2019-01-22 DIAGNOSIS — C50411 Malignant neoplasm of upper-outer quadrant of right female breast: Secondary | ICD-10-CM

## 2019-01-22 DIAGNOSIS — R918 Other nonspecific abnormal finding of lung field: Secondary | ICD-10-CM | POA: Diagnosis not present

## 2019-01-22 DIAGNOSIS — Z79811 Long term (current) use of aromatase inhibitors: Secondary | ICD-10-CM | POA: Insufficient documentation

## 2019-01-22 DIAGNOSIS — Z79818 Long term (current) use of other agents affecting estrogen receptors and estrogen levels: Secondary | ICD-10-CM | POA: Diagnosis not present

## 2019-01-22 DIAGNOSIS — Z9013 Acquired absence of bilateral breasts and nipples: Secondary | ICD-10-CM

## 2019-01-22 DIAGNOSIS — Z5112 Encounter for antineoplastic immunotherapy: Secondary | ICD-10-CM | POA: Diagnosis not present

## 2019-01-22 DIAGNOSIS — Z95828 Presence of other vascular implants and grafts: Secondary | ICD-10-CM

## 2019-01-22 LAB — CBC WITH DIFFERENTIAL/PLATELET
Abs Immature Granulocytes: 0.01 10*3/uL (ref 0.00–0.07)
Basophils Absolute: 0 10*3/uL (ref 0.0–0.1)
Basophils Relative: 1 %
Eosinophils Absolute: 0.1 10*3/uL (ref 0.0–0.5)
Eosinophils Relative: 3 %
HCT: 34.2 % — ABNORMAL LOW (ref 36.0–46.0)
Hemoglobin: 11.6 g/dL — ABNORMAL LOW (ref 12.0–15.0)
Immature Granulocytes: 0 %
Lymphocytes Relative: 32 %
Lymphs Abs: 1 10*3/uL (ref 0.7–4.0)
MCH: 31.3 pg (ref 26.0–34.0)
MCHC: 33.9 g/dL (ref 30.0–36.0)
MCV: 92.2 fL (ref 80.0–100.0)
Monocytes Absolute: 0.3 10*3/uL (ref 0.1–1.0)
Monocytes Relative: 9 %
NRBC: 0 % (ref 0.0–0.2)
Neutro Abs: 1.7 10*3/uL (ref 1.7–7.7)
Neutrophils Relative %: 55 %
Platelets: 164 10*3/uL (ref 150–400)
RBC: 3.71 MIL/uL — ABNORMAL LOW (ref 3.87–5.11)
RDW: 12.8 % (ref 11.5–15.5)
WBC: 3.1 10*3/uL — ABNORMAL LOW (ref 4.0–10.5)

## 2019-01-22 LAB — COMPREHENSIVE METABOLIC PANEL
ALT: 16 U/L (ref 0–44)
ANION GAP: 8 (ref 5–15)
AST: 21 U/L (ref 15–41)
Albumin: 3.9 g/dL (ref 3.5–5.0)
Alkaline Phosphatase: 101 U/L (ref 38–126)
BUN: 12 mg/dL (ref 6–20)
CO2: 22 mmol/L (ref 22–32)
Calcium: 8.7 mg/dL — ABNORMAL LOW (ref 8.9–10.3)
Chloride: 107 mmol/L (ref 98–111)
Creatinine, Ser: 1.03 mg/dL — ABNORMAL HIGH (ref 0.44–1.00)
GFR calc Af Amer: >60 mL/min (ref >60)
GFR calc non Af Amer: >60 mL/min (ref >60)
Glucose, Bld: 118 mg/dL — ABNORMAL HIGH (ref 70–99)
Potassium: 4 mmol/L (ref 3.5–5.1)
Sodium: 137 mmol/L (ref 135–145)
Total Bilirubin: 0.4 mg/dL (ref 0.3–1.2)
Total Protein: 7.3 g/dL (ref 6.5–8.1)

## 2019-01-22 MED ORDER — TRASTUZUMAB CHEMO 150 MG IV SOLR
450.0000 mg | Freq: Once | INTRAVENOUS | Status: AC
  Administered 2019-01-22: 450 mg via INTRAVENOUS
  Filled 2019-01-22: qty 21.43

## 2019-01-22 MED ORDER — SODIUM CHLORIDE 0.9 % IV SOLN
420.0000 mg | Freq: Once | INTRAVENOUS | Status: AC
  Administered 2019-01-22: 420 mg via INTRAVENOUS
  Filled 2019-01-22: qty 14

## 2019-01-22 MED ORDER — ACETAMINOPHEN 325 MG PO TABS
650.0000 mg | ORAL_TABLET | Freq: Once | ORAL | Status: AC
  Administered 2019-01-22: 650 mg via ORAL

## 2019-01-22 MED ORDER — HEPARIN SOD (PORK) LOCK FLUSH 100 UNIT/ML IV SOLN
500.0000 [IU] | Freq: Once | INTRAVENOUS | Status: AC | PRN
  Administered 2019-01-22: 500 [IU]
  Filled 2019-01-22: qty 5

## 2019-01-22 MED ORDER — ACETAMINOPHEN 325 MG PO TABS
ORAL_TABLET | ORAL | Status: AC
  Filled 2019-01-22: qty 2

## 2019-01-22 MED ORDER — SODIUM CHLORIDE 0.9% FLUSH
10.0000 mL | INTRAVENOUS | Status: DC | PRN
  Administered 2019-01-22: 10 mL
  Filled 2019-01-22: qty 10

## 2019-01-22 MED ORDER — SODIUM CHLORIDE 0.9 % IV SOLN
Freq: Once | INTRAVENOUS | Status: AC
  Administered 2019-01-22: 14:00:00 via INTRAVENOUS
  Filled 2019-01-22: qty 250

## 2019-01-22 MED ORDER — DIPHENHYDRAMINE HCL 25 MG PO CAPS: 50.0000 mg | ORAL_CAPSULE | Freq: Once | ORAL | Status: DC

## 2019-01-22 MED ORDER — SODIUM CHLORIDE 0.9% FLUSH
10.0000 mL | Freq: Once | INTRAVENOUS | Status: AC
  Administered 2019-01-22: 10 mL
  Filled 2019-01-22: qty 10

## 2019-01-22 MED ORDER — VENLAFAXINE HCL ER 75 MG PO CP24: 225.0000 mg | ORAL_CAPSULE | Freq: Every day | ORAL | 5 refills | Status: DC

## 2019-01-22 NOTE — Telephone Encounter (Signed)
Scheduled appt per 3/6 los.  Gave patient the number to central radiology.

## 2019-01-22 NOTE — Patient Instructions (Signed)
[facility] Discharge Instructions for Patients Receiving Chemotherapy  Today you received the following chemotherapy agents: Herceptin, Perjeta  To help prevent nausea and vomiting after your treatment, we encourage you to take your nausea medication as directed.   If you develop nausea and vomiting that is not controlled by your nausea medication, call the clinic.   BELOW ARE SYMPTOMS THAT SHOULD BE REPORTED IMMEDIATELY:  *FEVER GREATER THAN 100.5 F  *CHILLS WITH OR WITHOUT FEVER  NAUSEA AND VOMITING THAT IS NOT CONTROLLED WITH YOUR NAUSEA MEDICATION  *UNUSUAL SHORTNESS OF BREATH  *UNUSUAL BRUISING OR BLEEDING  TENDERNESS IN MOUTH AND THROAT WITH OR WITHOUT PRESENCE OF ULCERS  *URINARY PROBLEMS  *BOWEL PROBLEMS  UNUSUAL RASH Items with * indicate a potential emergency and should be followed up as soon as possible.  Feel free to call the clinic should you have any questions or concerns. The clinic phone number is [phone].  Please show the CHEMO ALERT CARD at check-in to the Emergency Department and triage nurse.   

## 2019-01-28 ENCOUNTER — Other Ambulatory Visit: Payer: Self-pay

## 2019-01-28 ENCOUNTER — Ambulatory Visit (HOSPITAL_COMMUNITY)
Admission: RE | Admit: 2019-01-28 | Discharge: 2019-01-28 | Disposition: A | Discharge status: Home / Self Care | Payer: BLUE CROSS/BLUE SHIELD | Source: Ambulatory Visit | Attending: Hematology | Admitting: Hematology

## 2019-01-28 DIAGNOSIS — C50411 Malignant neoplasm of upper-outer quadrant of right female breast: Secondary | ICD-10-CM | POA: Insufficient documentation

## 2019-01-28 DIAGNOSIS — M47816 Spondylosis without myelopathy or radiculopathy, lumbar region: Secondary | ICD-10-CM | POA: Diagnosis not present

## 2019-01-28 DIAGNOSIS — Z170 Estrogen receptor positive status [ER+]: Secondary | ICD-10-CM | POA: Diagnosis not present

## 2019-01-28 DIAGNOSIS — M5127 Other intervertebral disc displacement, lumbosacral region: Secondary | ICD-10-CM | POA: Diagnosis not present

## 2019-01-28 DIAGNOSIS — R918 Other nonspecific abnormal finding of lung field: Secondary | ICD-10-CM

## 2019-01-29 ENCOUNTER — Other Ambulatory Visit: Payer: Self-pay

## 2019-01-29 ENCOUNTER — Inpatient Hospital Stay: Payer: BLUE CROSS/BLUE SHIELD

## 2019-01-29 VITALS — BP 91/55 | HR 98 | Resp 18

## 2019-01-29 DIAGNOSIS — R918 Other nonspecific abnormal finding of lung field: Secondary | ICD-10-CM | POA: Diagnosis not present

## 2019-01-29 DIAGNOSIS — C50411 Malignant neoplasm of upper-outer quadrant of right female breast: Secondary | ICD-10-CM

## 2019-01-29 DIAGNOSIS — Z79818 Long term (current) use of other agents affecting estrogen receptors and estrogen levels: Secondary | ICD-10-CM | POA: Diagnosis not present

## 2019-01-29 DIAGNOSIS — Z170 Estrogen receptor positive status [ER+]: Secondary | ICD-10-CM

## 2019-01-29 DIAGNOSIS — Z5112 Encounter for antineoplastic immunotherapy: Secondary | ICD-10-CM | POA: Diagnosis not present

## 2019-01-29 DIAGNOSIS — Z9013 Acquired absence of bilateral breasts and nipples: Secondary | ICD-10-CM | POA: Diagnosis not present

## 2019-01-29 DIAGNOSIS — Z923 Personal history of irradiation: Secondary | ICD-10-CM | POA: Diagnosis not present

## 2019-01-29 DIAGNOSIS — Z95828 Presence of other vascular implants and grafts: Secondary | ICD-10-CM

## 2019-01-29 DIAGNOSIS — Z79811 Long term (current) use of aromatase inhibitors: Secondary | ICD-10-CM | POA: Diagnosis not present

## 2019-01-29 MED ORDER — GOSERELIN ACETATE 3.6 MG ~~LOC~~ IMPL
3.6000 mg | DRUG_IMPLANT | Freq: Once | SUBCUTANEOUS | Status: AC
  Administered 2019-01-29: 3.6 mg via SUBCUTANEOUS

## 2019-01-29 MED ORDER — GOSERELIN ACETATE 3.6 MG ~~LOC~~ IMPL
DRUG_IMPLANT | SUBCUTANEOUS | Status: AC
  Filled 2019-01-29: qty 3.6

## 2019-02-02 NOTE — Progress Notes (Signed)
[place] Counseling Session   The pt presented to session with a calm mood and matching affect. The pt was alert and oriented throughout session.   The pt expressed that she has experienced relationship stress in the last week. She expressed feeling frustrated when her partner has not supported her in her personal needs. The pt shared that she has delayed having a conversation with her partner about her needs due to "already having enough serious stuff to worry about."    The counselor validated the pt's experience of wanting to feel heard and understood by her partner. The pt and counselor explored how the pt has had "serious" conversations with her partner in the past. The pt and counselor discussed "I statements" as a useful skill in expressing personal feelings and needs. The pt shared that she found this discussion helpful in her process of discerning how she would like to talk to her partner.  The pt and counselor will meet again on Tuesday, March 17 at 1 pm. This note was entered late due to an Epic error, which is now resolved.  Doris Cheadle, Counseling Intern  [phone]

## 2019-02-03 ENCOUNTER — Telehealth: Payer: Self-pay | Admitting: Hematology

## 2019-02-03 NOTE — Telephone Encounter (Signed)
I spoke with pt about her recent CT chest and a lumbar MRI findings.  Her scan was reviewed in our tumor board this morning.  Her CT chest was negative, MRI showed small lesions in the L1 and L2 vertebral bodies, indeterminate.  The consensus from tumor board is follow-up with CT scan with contrast in 4 to 6 months, biopsy would be difficult and not recommended. I relayed the information to pt, she voiced good understanding and agrees with the plan.  Sheila [name]  02/03/2019

## 2019-02-10 NOTE — Progress Notes (Signed)
[place]  The pt and counselor planned to have a phone session today, but the pt reported that she had a schedule change that would not allow for a full session. The counselor confirmed understanding, and engaged in a check-in with the pt. The pt expressed that she is coping well with the current circumstances, though transitions related to coronavirus have been challenging. The pt presented with a calm mood and agreeable voice with organized speech.   The pt and counselor discussed termination, and processed feelings related to the end of the counseling relationship. The pt expressed gratitude toward the counselor and reported that she has supports in place to continue managing her transition to survivorship. The pt noted that she is on the support group mailing list, and though she is not interested in a call from a support team member at this time, she will reach out if any needs arise. The counselor emailed the pt counseling referrals and supportive resources which were discussed with the pt. The pt reported that she will begin reaching out to counselors in the community that she was referred to and notify the support team of any questions or concerns.   Doris Cheadle, Counseling Intern [phone]

## 2019-02-25 NOTE — Progress Notes (Addendum)
[place]   [phone] [phone]   Clinic Follow up Note   Patient Care Team: [name], MD as PCP - General (Family Medicine) [name] (Family Medicine) [name], MD as Consulting Physician (General Surgery) [name], MD as Consulting Physician (Hematology) Kyung Rudd, MD as Consulting Physician (Radiation Oncology)  Date of Service:  02/26/2019  CHIEF COMPLAINT: F/u of right breast cancer  SUMMARY OF ONCOLOGIC HISTORY: Oncology History    Cancer Staging Malignant neoplasm of upper-outer quadrant of right breast in female, estrogen receptor positive ([place]) Staging form: Breast, AJCC 8th Edition - Clinical stage from 06/11/2017: Stage IB (cT3, cN0, cM0, G2, ER: Positive, PR: Positive, HER2: Positive) - Signed by [name], MD on 06/16/2017 - Pathologic stage from 07/22/2017: Stage IB (pT3(m), pN2a(sn), cM0, G2, ER: Positive, PR: Positive, HER2: Positive) - Signed by [name], NP on 08/04/2017      Malignant neoplasm of upper-outer quadrant of right breast in female, estrogen receptor positive ([place])   06/11/2017 Initial Biopsy    Diagnosis 06/11/17 Breast, right, needle core biopsy, 11:00 o'clock - INVASIVE DUCTAL CARCINOMA, G2    06/11/2017 Mammogram    [name] and MM Diagnostic Breast Tomo Bilateral 06/11/17 IMPRESSION: 1. Highly suspicious mass within the retroareolar right breast, extending from the 8:00 to 11:00 axes, anterior to posterior depth, extending anteriorly to the nipple with associated nipple retraction, measuring at least 5.5 cm by ultrasound  2. No sonographic evidence of metastatic lymphadenopathy in the right axilla. 3. No evidence of malignancy within the left breast.     06/11/2017 Initial Diagnosis    Malignant neoplasm of upper-outer quadrant of right breast in female, estrogen receptor positive ([place])    06/11/2017 Receptors her2    Estrogen Receptor: 100%, POSITIVE, STRONG STAINING  INTENSITY Progesterone Receptor: 100%, POSITIVE, STRONG STAINING INTENSITY Proliferation Marker Ki67: 12%Proliferation Marker Ki67: 12%  HER2 - **POSITIVE** RATIO OF HER2/CEP17 SIGNALS 2.55 AVERAGE HER2 COPY NUMBER PER CELL 4.85    06/25/2017 Genetic Testing    Negative genetic testing on the 9 gene STAT panel.  The STAT Breast cancer panel offered by [facility] includes sequencing and rearrangement analysis for the following 9 genes:  ATM, BRCA1, BRCA2, CDH1, CHEK2, PALB2, PTEN, STK11 and TP53.   The report date is June 25, 2017.  Negative genetic testing on the common hereditary cancer panel.  The Hereditary Gene Panel offered by [facility] includes sequencing and/or deletion duplication testing of the following 46 genes: APC, ATM, AXIN2, BARD1, BMPR1A, BRCA1, BRCA2, BRIP1, CDH1, CDKN2A (p14ARF), CDKN2A (p16INK4a), CHEK2, CTNNA1, DICER1, EPCAM (Deletion/duplication testing only), GREM1 (promoter region deletion/duplication testing only), KIT, MEN1, MLH1, MSH2, MSH3, MSH6, MUTYH, NBN, NF1, NHTL1, PALB2, PDGFRA, PMS2, POLD1, POLE, PTEN, RAD50, RAD51C, RAD51D, SDHB, SDHC, SDHD, SMAD4, SMARCA4. STK11, TP53, TSC1, TSC2, and VHL.  The following genes were evaluated for sequence changes only: SDHA and HOXB13 c.251G>A variant only.  The report date is June 25, 2017.     07/02/2017 Imaging    CT cAP 07/02/17 IMPRESSION: 1. Subareolar right breast mass. No compelling findings of nodal or metastatic involvement. 2. Old granulomatous disease. 3. There is a 7 mm enhancing or hyperdense focus posteriorly in segment 7 of the liver in the subcapsular region. By virtue of its small size this lesion is technically nonspecific although statistically likely to be a small benign lesions such as flash filling hemangioma. This may warrant surveillance. 4.  Prominent stool throughout the colon favors constipation. 5. Degenerative disc disease  and spondylosis at L5-S1 likely causing mild impingement.    07/02/2017  Imaging    Bone scan 07/02/17 IMPRESSION: Today' s exam is negative. I ascribed the tiny focus of activity just proximal to the left antecubital region to injection site.    07/22/2017 Surgery    RIGHT TOTAL MASTECTOMY WITH RIGHT AXILLARY SENTINEL LYMPH NODE BIOPSY, LEFT PROPHYLACTIC MASTECTOM and INSERTION PORT-A-CATH WITH [name] by [name] and [facility]     07/22/2017 Pathology Results    Diagnosis 1. Breast, simple mastectomy, Left - FIBROADENOMA. - NO MALIGNANCY IDENTIFIED. 2. 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 - ADDITIONAL FOCUS OF LOBULAR CARCINOMA, GRADE 2, SPANNING 2.2 CM. - INTERMEDIATE GRADE DUCTAL CARCINOMA IN SITU. - INVASIVE CARCINOMA COMES TO WITHIN 0.2 TO 0.3 CM OF THE DEEP MARGIN, FOCALLY. - TUMOR FOCALLY INVOLVES EPIDERMIS. - LYMPHOVASCULAR INVASION OF DERMAL LYMPHATICS. - ONE OF ONE LYMPH NODES NEGATIVE FOR CARCINOMA (0/1). - SEE ONCOLOGY TABLE. 3. Lymph node, sentinel, biopsy, Right axillary - ONE LYMPH NODE WITH ISOLATED TUMOR CELLS (0/1). 4. Lymph node, sentinel, biopsy, Right - METASTATIC CARCINOMA IN ONE OF ONE LYMPH NODES (1/1). 5. Lymph node, sentinel, biopsy, Right - ONE OF ONE LYMPH NODES NEGATIVE FOR CARCINOMA (0/1). 6. Lymph node, sentinel, biopsy, Right - METASTATIC CARCINOMA IN ONE OF ONE LYMPH NODES (1/1). - EXTRACAPSULAR EXTENSION. 7. Lymph node, sentinel, biopsy, Right - METASTATIC CARCINOMA IN ONE OF ONE LYMPH NODES (1/1). - EXTRACAPSULAR EXTENSION. 8. Lymph node, sentinel, biopsy, Right - ONE OF ONE LYMPH NODES NEGATIVE FOR CARCINOMA (0/1). 9. Lymph node, sentinel, biopsy, Right - ONE OF ONE LYMPH NODES NEGATIVE FOR CARCINOMA (0/1). 10. Lymph node, sentinel, biopsy, Right - METASTATIC CARCINOMA IN ONE OF ONE LYMPH NODES (1/1). - EXTRACAPSULAR EXTENSION. 11. Lymph node, biopsy, Right axillary - ONE OF ONE LYMPH NODES NEGATIVE FOR CARCINOMA (0/1).     07/22/2017 Receptors  her2    Ductal carcinoma in primary breast tumor and node metastasis are ER 95%, PR 95% strongly positive, and HER2 + The lobular component in primary breast tumor is ER 95% positive, PR 95% positive, strong staining, HER-2 negative.   Original biopsy [id number]: Ductal morphology, Her2 FISH positive. Current specimen: Main focus is 75% ductal morphology, 25% lobular morphology. Smaller focus is lobular morphology. #2A Lobular morphology, Her IHC negative. #2E Lobular morphology, Her2 FISH negative, Her2 IHC negative. #4 50% lobular and 50% ductal morphology, Her2 FISH positive, Her2 IHC negative. #6 Ductal morphology #7 Ductal morphology #10 50% lobular, 50% ductal morphology    08/25/2017 Imaging    MRI abdomen done at [facility]:  Enhancing structure measing 5 mm is compatible with a flash filling hemangioma. No specific findings to suggest metastatic cancer    08/25/2017 Imaging    [facility] Abdomen [facility] 08/25/17  IMPRESSION: 1. Lesion in segment 7 of the liver has signal and enhancement characteristics compatible with a flash filling hemangioma. No specific findings identified to suggest metastatic disease.    08/29/2017 - 12/12/2017 Chemotherapy    adjuvant TCHP every 3 weeks for 6 cycles starting 08/29/17. Postponed and decreased Carbo to 657m and added Onpro with cycle 2 on 09/19/17 due to neutropenic fever. Carbplatin further reduced to 5023mstarting with cycle 3 due to thrombocytopenia. Herceptin was held for Cycle 4 and 5 due to worsening global longitudinal strain found on ECHO from 11/07/17. Completed 6 cycles on 12/12/17       09/04/2017 - 09/09/2017 Hospital Admission  Admit date: 09/04/2017 Discharge date: 09/09/2017  DISCHARGE DIAGNOSES:  Principal Problem:   Sepsis [facility]) Active Problems:   Malignant neoplasm of upper-outer quadrant of right breast in female, estrogen receptor positive ([place])   Port-A-Cath in place   Neutropenic fever ([place])   Hyponatremia    Malignant neoplasm of breast in female, estrogen receptor positive ([place])     12/28/2017 Imaging    CT CHEST IMPRESSION: 1. 2.3 x 2.7 x 1.2 cm irregular masslike opacity within the right middle lobe worrisome for malignancy/metastasis. 2. 3 new very small sclerotic lesions, within the sternum and the body of T4, worrisome for new metastases.    12/28/2017 - 01/05/2018 Hospital Admission    Admit date: 12/28/17-01/05/18 Admission diagnosis: multifocal pneumonia, influenza A, sepsis, hypoxia Additional comments: imaging during hospitalization notable for new irregular masslike opacity in the right middle lobe worrisome for metastasis and new very small sclerotic lesion within the sternum and the body of T4, worrisome for metastasis    12/29/2017 Imaging    CT ABD/PELVIS IMPRESSION: No definite acute intra-abdominal or intrapelvic abnormalities.  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    12/30/2017 Imaging    CT HEAD IMPRESSION: No acute intracranial abnormality or enhancing lesion.    01/16/2018 - 01/22/2019 Chemotherapy    Maintence Herceptin and Perjeta every 3 weeks for 6-12 months starting 01/16/18. Held 03/2018-07/17/18 due to low EF. Completed on 01/22/19     02/04/2018 PET scan    IMPRESSION: 1. The previous consolidation in the right middle lobe currently has a more nodular and reticular appearance with some associated mild atelectasis along its anterior margin. This demonstrates low-grade metabolic activity with maximum SUV 3.2 (background blood pool activity is 2.7). Although conceivably related to low-grade residuum from the patient's malignancy, the presence of calcified right hilar lymph nodes and small calcifications along the nodularity raise suspicion for a low-grade active granulomatous process or atypical infectious process. Surveillance is recommended. 2. Scattered deposits of hypermetabolic but benign metabolically active brown fat.     02/23/2018 - 04/09/2018 Radiation Therapy    Radiation treatment dates:   02/23/2018 - 04/09/2018  Site/dose:   The patient initially received a dose of 50.4 Gy in 28 fractions to the right chest wall and supraclavicular region. This was delivered using a 3-D conformal, 4 field technique. The patient then received a boost to the mastectomy scar. This delivered an additional 10 Gy in 5 fractions using an en face electron field. The total dose was 60.4 Gy.  Narrative: The patient tolerated radiation treatment relatively well.   The patient had some expected skin irritation as she progressed during treatment. Moist desquamation was not present at the end of treatment.    03/20/2018 -  Anti-estrogen oral therapy    -Monthly Zoladex injection starting 03/20/18  -Letrozole 2.'5mg'$  once dialy starting 05/03/18  -Switched to Anastrozole starting 07/2018 Due to her worsening back pain.    04/06/2018 Imaging    CT Chest wo contrast IMPRESSION: 1. Stable linear nodular thickening in the RIGHT middle lobe is favored post infectious or inflammatory scarring. 2. Calcified RIGHT hilar lymph node is consistent post infectious process. 3. Stable small LEFT upper lobe nodule. 4. Stable sclerotic lesion in the manubrium. Sclerotic lesion at T3 is more prominent (4 mm). Sclerotic lesion at L 1 is new from 07/02/2017.    04/06/2018 Imaging    04/06/2018 CT Chest IMPRESSION: 1. Stable linear nodular thickening in the RIGHT middle lobe is favored  post infectious or inflammatory scarring. 2. Calcified RIGHT hilar lymph node is consistent post infectious process. 3. Stable small LEFT upper lobe nodule. 4. Stable sclerotic lesion in the manubrium. Sclerotic lesion at T3 is more prominent (4 mm). Sclerotic lesion at L 1 is new from 07/02/2017.    04/10/2018 Echocardiogram    LV EF 45%-50%    05/25/2018 Echocardiogram    LV EF 45%-50%    07/13/2018 Echocardiogram    Normal LV size with EF 55-60%    07/31/2018  Imaging    07/31/2018 Bone Scan IMPRESSION: No evidence of skeletal metastatic disease.    07/31/2018 Imaging    07/31/2018 CT CAP IMPRESSION: Residual linear and nodular peribronchovascular opacities in the right middle lobe, with other scattered areas of ground-glass opacities in the right middle, right upper and right lower lobes, which are located within the previously demonstrated bulky airspace consolidation. The findings are improved from the chest CT dated 12/30/2017, and relatively stable from PET-CT dated 02/04/2018. These may represent post infectious/post inflammatory changes or residual malignancy.  Stable partially calcified right hilar lymphadenopathy.  Interval development of nodular soft tissue thickening within the anterior mediastinum in the area of the thymus, new from chest CT dated 12/30/2017. This may represent reactive thymic tissue, thymoma or lymphadenopathy.  1.8 cm probably physiologic left ovarian cyst. Borderline thickening of the endometrium measuring 15.6 mm. Further evaluation with pelvic ultrasound may be considered.  No new or suspicious findings within the abdomen.    09/15/2018 Echocardiogram    EF 55-60%    01/28/2019 Imaging    MRI Lumbar spie 01/28/19  IMPRESSION: 1. Subcentimeter lesions in the L1 and L2 vertebral bodies, indeterminate for metastases. 2. Mild lower lumbar disc degeneration without stenosis.    01/28/2019 Imaging    CT chest 01/28/19  IMPRESSION: Right lung post radiation changes. No active lung disease or other acute findings.  No evidence of recurrent or metastatic carcinoma within the thorax.      CURRENT THERAPY:  -Monthly Zoladex injections starting 03/20/18  -Letrozole 2.'5mg'$  once daily starting 05/03/18, changed to anastrozole in 07/2018 -PENDING Neratinib starting in 03/2019  INTERVAL HISTORY:  Sheila [name] is here for a follow up of right breast cancer. She presents to the clinic today by herself.  She notes she is doing well.  She notes she is taking biotin and her hair is growing well. She has back pain which is exacerbated by laying down. She takes aleve and will take flexeril occasionally.  She is tolerating Zoladex injection and anastrozole and tolerating well with hot flashes. This is manageable. She has loose stool and diarrhea for the last week.  She notes she drinks 3 32ounces bottled water.    REVIEW OF SYSTEMS:   Constitutional: Denies fevers, chills or abnormal weight loss (+)  Eyes: Denies blurriness of vision Ears, nose, mouth, throat, and face: Denies mucositis or sore throat Respiratory: Denies cough, dyspnea or wheezes Cardiovascular: Denies palpitation, chest discomfort or lower extremity swelling Gastrointestinal:  Denies nausea, heartburn (+) daily loose stool/diarrhea  Skin: Denies abnormal skin rashes MSK: (+) Stable back pain  Lymphatics: Denies new lymphadenopathy or easy bruising Neurological:Denies numbness, tingling or new weaknesses Behavioral/Psych: Mood is stable, no new changes  All other systems were reviewed with the patient and are negative.  MEDICAL HISTORY:  Past Medical History:  Diagnosis Date  . Anxiety   . Cancer [facility])    right breast   . Cardiomyopathy ([place])   . Family history of  breast cancer   . Family history of colon cancer   . GERD (gastroesophageal reflux disease)   . Headache    couple of migraines in the past  . History of kidney stones   . Vitamin D deficiency     SURGICAL HISTORY: Past Surgical History:  Procedure Laterality Date  . egg donation    . kidney stone removal    . MASTECTOMY W/ SENTINEL NODE BIOPSY Bilateral 07/22/2017   Procedure: RIGHT TOTAL MASTECTOMY WITH RIGHT AXILLARY SENTINEL LYMPH NODE BIOPSY, LEFT PROPHYLACTIC MASTECTOMY;  Surgeon: [name], MD;  Location: [place];  Service: General;  Laterality: Bilateral;  . PORTACATH PLACEMENT Right 07/22/2017   Procedure: INSERTION PORT-A-CATH WITH [name];   Surgeon: [name], MD;  Location: [place];  Service: General;  Laterality: Right;    I have reviewed the social history and family history with the patient and they are unchanged from previous note.  ALLERGIES:  has No Known Allergies.  MEDICATIONS:  Current Outpatient Medications  Medication Sig Dispense Refill  . anastrozole (ARIMIDEX) 1 MG tablet Take 1 tablet (1 mg total) by mouth daily. 30 tablet 11  . B Complex-C (B-COMPLEX WITH VITAMIN C) tablet Take 1 tablet by mouth daily.    . Biotin 1000 MCG tablet Take 1,000 mcg by mouth 3 (three) times daily.    . carvedilol (COREG) 3.125 MG tablet TAKE 1 TABLET BY MOUTH 2 TIMES DAILY. 60 tablet 5  . Cholecalciferol (VITAMIN D) 2000 units tablet Take 2,000 Units by mouth daily.    . cyclobenzaprine (FLEXERIL) 5 MG tablet Take 1 tablet (5 mg total) by mouth 2 (two) times daily as needed for muscle spasms. 30 tablet 0  . diphenoxylate-atropine (LOMOTIL) 2.5-0.025 MG tablet Take 1-2 tablets by mouth 4 (four) times daily as needed for diarrhea or loose stools. 30 tablet 0  . levothyroxine (SYNTHROID, LEVOTHROID) 25 MCG tablet Take 25 mcg by mouth daily before breakfast.    . losartan (COZAAR) 25 MG tablet Take 0.5 tablets (12.5 mg total) by mouth at bedtime. 15 tablet 5  . Melatonin 10 MG TABS Take 0.5 tablets by mouth.     . naproxen sodium (ANAPROX) 220 MG tablet Take 220 mg 2 (two) times daily as needed by mouth (pain).     . Omega-3 Fatty Acids (OMEGA-3 FISH OIL PO) Take 100 Units by mouth.    . venlafaxine XR (EFFEXOR-XR) 75 MG 24 hr capsule Take 3 capsules (225 mg total) by mouth daily with breakfast. 90 capsule 5   Current Facility-Administered Medications  Medication Dose Route Frequency Provider Last Rate Last Dose  . gi cocktail (Maalox,Lidocaine,Donnatal)  30 mL Oral Once Harle Stanford., PA-C        PHYSICAL EXAMINATION: ECOG PERFORMANCE STATUS: 1 - Symptomatic but completely ambulatory  Vitals:   02/26/19 1436  BP:  101/60  Pulse: 99  Resp: 18  Temp: 98.5 F (36.9 C)  SpO2: 98%   Filed Weights   02/26/19 1436  Weight: 165 lb 6.4 oz (75 kg)    GENERAL:alert, no distress and comfortable SKIN: skin color, texture, turgor are normal, no rashes or significant lesions EYES: normal, Conjunctiva are pink and non-injected, sclera clear OROPHARYNX:no exudate, no erythema and lips, buccal mucosa, and tongue normal  NECK: supple, thyroid normal size, non-tender, without nodularity LYMPH:  no palpable lymphadenopathy in the cervical, axillary or inguinal LUNGS: clear to auscultation and percussion with normal breathing effort HEART: regular rate & rhythm and no murmurs and no  lower extremity edema ABDOMEN:abdomen soft, non-tender and normal bowel sounds Musculoskeletal:no cyanosis of digits and no clubbing  NEURO: alert & oriented x 3 with fluent speech, no focal motor/sensory deficits  LABORATORY DATA:  I have reviewed the data as listed CBC Latest Ref Rng & Units 02/26/2019 01/22/2019 12/11/2018  WBC 4.0 - 10.5 K/uL 3.3(L) 3.1(L) 2.9(L)  Hemoglobin 12.0 - 15.0 g/dL 12.2 11.6(L) 11.7(L)  Hematocrit 36.0 - 46.0 % 36.8 34.2(L) 34.2(L)  Platelets 150 - 400 K/uL 179 164 181     CMP Latest Ref Rng & Units 02/26/2019 01/22/2019 12/11/2018  Glucose 70 - 99 mg/dL 125(H) 118(H) 107(H)  BUN 6 - 20 mg/dL '7 12 11  '$ Creatinine 0.44 - 1.00 mg/dL 1.18(H) 1.03(H) 1.01(H)  Sodium 135 - 145 mmol/L 140 137 139  Potassium 3.5 - 5.1 mmol/L 4.1 4.0 4.3  Chloride 98 - 111 mmol/L 108 107 105  CO2 22 - 32 mmol/L '23 22 25  '$ Calcium 8.9 - 10.3 mg/dL 8.8(L) 8.7(L) 9.3  Total Protein 6.5 - 8.1 g/dL 7.4 7.3 7.1  Total Bilirubin 0.3 - 1.2 mg/dL 0.4 0.4 0.5  Alkaline Phos 38 - 126 U/L 117 101 107  AST 15 - 41 U/L '15 21 16  '$ ALT 0 - 44 U/L '14 16 16      '$ RADIOGRAPHIC STUDIES: I have personally reviewed the radiological images as listed and agreed with the findings in the report. No results found.   ASSESSMENT & PLAN:  Sheila [name] is a 42 y.o. female with   1. Malignant neoplasm of upper-outer quadrant of right breast in female, mixed invasive ductal and lobular carcinoma, pT3(m)N2aM0, stage 1b, ductal carcinoma triple positive, lobular carcinoma ER+/PR+/HER2-, Grade 2 -She was diagnosed in 05/2017. She is s/pb/lmastectomy, adjuvant TCHP and radiation and maintenance Herceptin/Perjeta.  -Her genetic testing was negative.  -She is currently on adjuvant anastrozole with monthlyZoladexinjections, tolerating well.  -She has cancelled her BSO for now,and wants to have the surgerylater this year. Shewill continue Zoladex injectionsmonthly -With completion of Herceptin and Perjeta maintenance therapy I discussed the option of further oral anti-her2 Neratinib to further reduce her risk of recurrence. The benefit (reduced relative risk of recurrence by 27%, and increase 5-y survival by 2-3%, given her multiple positive node, benefit on her is probably higher) and side effects, especially diarrhea and its management, nausea, fatigue, abnormal liver function, etc. I gave her reading material on this medication. She is interested. Plan to start in 1 month.  -She is clinically doing well and stable. Labs reviewed, CBC and CMP WNL except WBC 3.3, Ct 1.18, Ca 8.8. Ca 27.29 still pending. Continue with Zoladex today and monthly  -Continue Anastrozole  -F/u in 8 weeks   2. Insomnia  -She has history of insomnia failed multiple agents such as lunesta, trazodone, tylenol pm, and xanax and only had mild relief with Remeron. -She previously was able to ween off Ativan andAmbienwhich improved her sleep. -She currently takeshigh doseMelatonin to help her sleep, is not very effective. -Continues to improve.Not mentioned today, likely resolved.   3. Hot flash.  -Now mild and tolerable. ContinueEffexor'225mg'$  daily   4. Bone Health -Continue Vitamin D supplement and Vitamin C rich food -We will order a bone  density scan in 2020  5. DiffuseBack pain -Her CT Chest on 04/06/18 showed sclerotic lesions of spine in T3 and L1, buther bone scan in 07/2018 was negative  -Letrozole was changed to anastrozole in September 2019due to arthralgia -MRI lumbar from 01/28/19  shows lesions of L1 and L2 indeterminate, will continue follow up   -She will continue Flexeril and aleve for pain.   6. Lung lesions  -She was found to have masslike opacity in the right middle lobe when she was admitted for neutropenic fever in February 2019, it got progressively worse during the hospital stay, felt to be related to pneumonia, on subsequent follow-up scan, this has much improved, but still has some residual nodule opacity and groundglass opacity in the right lung on the last CT scan in September 2019. -CT Chest from 01/28/19 showed overall much improved/nearresolved, no concern for metastatic disease or new pulmonary nodules. I do not plan to repeat more CT chest for f/u.    PLAN: -Labs reviewed with her, Cr slightly elevated, will monitor  -Continue Zoladex monthly and anastrozole daily.  -will prescribe neratinib, she will start in 4 weeks, first week '160mg'$ , increase to '200mg'$  on second week, and '240mg'$  daily on third week and forward, if tolerates well, will also call in budesonide '9mg'$  daily for first 1-2 months  -Lab, flush, and injection in 4 weeks and 8 weeks  -F/u in 6 and 8 weeks     No problem-specific Assessment & Plan notes found for this encounter.   No orders of the defined types were placed in this encounter.  All questions were answered. The patient knows to call the clinic with any problems, questions or concerns. No barriers to learning was detected. I spent 25 minutes counseling the patient face to face. The total time spent in the appointment was 30 minutes and more than 50% was on counseling and review of test results     [name], MD 02/26/2019   I, Joslyn Devon, am acting as scribe for  [name], MD.   I have reviewed the above documentation for accuracy and completeness, and I agree with the above.

## 2019-02-26 ENCOUNTER — Inpatient Hospital Stay: Payer: BLUE CROSS/BLUE SHIELD | Attending: Hematology

## 2019-02-26 ENCOUNTER — Telehealth: Payer: Self-pay | Admitting: Hematology

## 2019-02-26 ENCOUNTER — Inpatient Hospital Stay (HOSPITAL_BASED_OUTPATIENT_CLINIC_OR_DEPARTMENT_OTHER): Payer: BLUE CROSS/BLUE SHIELD | Admitting: Hematology

## 2019-02-26 ENCOUNTER — Other Ambulatory Visit: Payer: Self-pay

## 2019-02-26 ENCOUNTER — Inpatient Hospital Stay: Payer: BLUE CROSS/BLUE SHIELD

## 2019-02-26 VITALS — BP 101/60 | HR 99 | Temp 98.5°F | Resp 18 | Ht 66.0 in | Wt 165.4 lb

## 2019-02-26 DIAGNOSIS — C50411 Malignant neoplasm of upper-outer quadrant of right female breast: Secondary | ICD-10-CM

## 2019-02-26 DIAGNOSIS — Z79811 Long term (current) use of aromatase inhibitors: Secondary | ICD-10-CM

## 2019-02-26 DIAGNOSIS — F419 Anxiety disorder, unspecified: Secondary | ICD-10-CM

## 2019-02-26 DIAGNOSIS — I429 Cardiomyopathy, unspecified: Secondary | ICD-10-CM | POA: Insufficient documentation

## 2019-02-26 DIAGNOSIS — Z79899 Other long term (current) drug therapy: Secondary | ICD-10-CM | POA: Diagnosis not present

## 2019-02-26 DIAGNOSIS — G4700 Insomnia, unspecified: Secondary | ICD-10-CM | POA: Diagnosis not present

## 2019-02-26 DIAGNOSIS — Z9013 Acquired absence of bilateral breasts and nipples: Secondary | ICD-10-CM

## 2019-02-26 DIAGNOSIS — Z9221 Personal history of antineoplastic chemotherapy: Secondary | ICD-10-CM | POA: Diagnosis not present

## 2019-02-26 DIAGNOSIS — R918 Other nonspecific abnormal finding of lung field: Secondary | ICD-10-CM

## 2019-02-26 DIAGNOSIS — Z170 Estrogen receptor positive status [ER+]: Secondary | ICD-10-CM

## 2019-02-26 DIAGNOSIS — Z923 Personal history of irradiation: Secondary | ICD-10-CM

## 2019-02-26 DIAGNOSIS — R232 Flushing: Secondary | ICD-10-CM | POA: Diagnosis not present

## 2019-02-26 DIAGNOSIS — Z95828 Presence of other vascular implants and grafts: Secondary | ICD-10-CM

## 2019-02-26 LAB — COMPREHENSIVE METABOLIC PANEL
ALT: 14 U/L (ref 0–44)
AST: 15 U/L (ref 15–41)
Albumin: 3.6 g/dL (ref 3.5–5.0)
Alkaline Phosphatase: 117 U/L (ref 38–126)
Anion gap: 9 (ref 5–15)
BUN: 7 mg/dL (ref 6–20)
CO2: 23 mmol/L (ref 22–32)
Calcium: 8.8 mg/dL — ABNORMAL LOW (ref 8.9–10.3)
Chloride: 108 mmol/L (ref 98–111)
Creatinine, Ser: 1.18 mg/dL — ABNORMAL HIGH (ref 0.44–1.00)
GFR calc Af Amer: >60 mL/min (ref >60)
GFR calc non Af Amer: 57 mL/min — ABNORMAL LOW (ref >60)
Glucose, Bld: 125 mg/dL — ABNORMAL HIGH (ref 70–99)
Potassium: 4.1 mmol/L (ref 3.5–5.1)
Sodium: 140 mmol/L (ref 135–145)
Total Bilirubin: 0.4 mg/dL (ref 0.3–1.2)
Total Protein: 7.4 g/dL (ref 6.5–8.1)

## 2019-02-26 LAB — CBC WITH DIFFERENTIAL/PLATELET
Abs Immature Granulocytes: 0 10*3/uL (ref 0.00–0.07)
Basophils Absolute: 0.1 10*3/uL (ref 0.0–0.1)
Basophils Relative: 2 %
Eosinophils Absolute: 0.1 10*3/uL (ref 0.0–0.5)
Eosinophils Relative: 4 %
HCT: 36.8 % (ref 36.0–46.0)
Hemoglobin: 12.2 g/dL (ref 12.0–15.0)
Immature Granulocytes: 0 %
Lymphocytes Relative: 34 %
Lymphs Abs: 1.1 10*3/uL (ref 0.7–4.0)
MCH: 30.7 pg (ref 26.0–34.0)
MCHC: 33.2 g/dL (ref 30.0–36.0)
MCV: 92.7 fL (ref 80.0–100.0)
Monocytes Absolute: 0.2 10*3/uL (ref 0.1–1.0)
Monocytes Relative: 7 %
Neutro Abs: 1.8 10*3/uL (ref 1.7–7.7)
Neutrophils Relative %: 53 %
Platelets: 179 10*3/uL (ref 150–400)
RBC: 3.97 MIL/uL (ref 3.87–5.11)
RDW: 12.5 % (ref 11.5–15.5)
WBC: 3.3 10*3/uL — ABNORMAL LOW (ref 4.0–10.5)
nRBC: 0 % (ref 0.0–0.2)

## 2019-02-26 MED ORDER — GOSERELIN ACETATE 3.6 MG ~~LOC~~ IMPL
3.6000 mg | DRUG_IMPLANT | Freq: Once | SUBCUTANEOUS | Status: AC
  Administered 2019-02-26: 3.6 mg via SUBCUTANEOUS

## 2019-02-26 NOTE — Telephone Encounter (Signed)
Scheduled appt per 4/10 los. °

## 2019-02-26 NOTE — Patient Instructions (Signed)
Goserelin injection What is this medicine? GOSERELIN (GOE se rel in) is similar to a hormone found in the body. It lowers the amount of sex hormones that the body makes. Men will have lower testosterone levels and women will have lower estrogen levels while taking this medicine. In men, this medicine is used to treat prostate cancer; the injection is either given once per month or once every 12 weeks. A once per month injection (only) is used to treat women with endometriosis, dysfunctional uterine bleeding, or advanced breast cancer. This medicine may be used for other purposes; ask your health care provider or pharmacist if you have questions. COMMON BRAND NAME(S): Zoladex What should I tell my health care provider before I take this medicine? They need to know if you have any of these conditions (some only apply to women): -diabetes -heart disease or previous heart attack -high blood pressure -high cholesterol -kidney disease -osteoporosis or low bone density -problems passing urine -spinal cord injury -stroke -tobacco smoker -an unusual or allergic reaction to goserelin, hormone therapy, other medicines, foods, dyes, or preservatives -pregnant or trying to get pregnant -breast-feeding How should I use this medicine? This medicine is for injection under the skin. It is given by a health care professional in a hospital or clinic setting. Men receive this injection once every 4 weeks or once every 12 weeks. Women will only receive the once every 4 weeks injection. Talk to your pediatrician regarding the use of this medicine in children. Special care may be needed. Overdosage: If you think you have taken too much of this medicine contact a poison control center or emergency room at once. NOTE: This medicine is only for you. Do not share this medicine with others. What if I miss a dose? It is important not to miss your dose. Call your doctor or health care professional if you are unable to  keep an appointment. What may interact with this medicine? -female hormones like estrogen -herbal or dietary supplements like black cohosh, chasteberry, or DHEA -female hormones like testosterone -prasterone This list may not describe all possible interactions. Give your health care provider a list of all the medicines, herbs, non-prescription drugs, or dietary supplements you use. Also tell them if you smoke, drink alcohol, or use illegal drugs. Some items may interact with your medicine. What should I watch for while using this medicine? Visit your doctor or health care professional for regular checks on your progress. Your symptoms may appear to get worse during the first weeks of this therapy. Tell your doctor or healthcare professional if your symptoms do not start to get better or if they get worse after this time. Your bones may get weaker if you take this medicine for a long time. If you smoke or frequently drink alcohol you may increase your risk of bone loss. A family history of osteoporosis, chronic use of drugs for seizures (convulsions), or corticosteroids can also increase your risk of bone loss. Talk to your doctor about how to keep your bones strong. This medicine should stop regular monthly menstration in women. Tell your doctor if you continue to menstrate. Women should not become pregnant while taking this medicine or for 12 weeks after stopping this medicine. Women should inform their doctor if they wish to become pregnant or think they might be pregnant. There is a potential for serious side effects to an unborn child. Talk to your health care professional or pharmacist for more information. Do not breast-feed an infant while taking   this medicine. Men should inform their doctors if they wish to father a child. This medicine may lower sperm counts. Talk to your health care professional or pharmacist for more information. What side effects may I notice from receiving this  medicine? Side effects that you should report to your doctor or health care professional as soon as possible: -allergic reactions like skin rash, itching or hives, swelling of the face, lips, or tongue -bone pain -breathing problems -changes in vision -chest pain -feeling faint or lightheaded, falls -fever, chills -pain, swelling, warmth in the leg -pain, tingling, numbness in the hands or feet -signs and symptoms of low blood pressure like dizziness; feeling faint or lightheaded, falls; unusually weak or tired -stomach pain -swelling of the ankles, feet, hands -trouble passing urine or change in the amount of urine -unusually high or low blood pressure -unusually weak or tired Side effects that usually do not require medical attention (report to your doctor or health care professional if they continue or are bothersome): -change in sex drive or performance -changes in breast size in both males and females -changes in emotions or moods -headache -hot flashes -irritation at site where injected -loss of appetite -skin problems like acne, dry skin -vaginal dryness This list may not describe all possible side effects. Call your doctor for medical advice about side effects. You may report side effects to FDA at 1-800-FDA-1088. Where should I keep my medicine? This drug is given in a hospital or clinic and will not be stored at home. NOTE: This sheet is a summary. It may not cover all possible information. If you have questions about this medicine, talk to your doctor, pharmacist, or health care provider.  2019 Elsevier/Gold Standard (2014-01-11 11:10:35)  

## 2019-02-27 ENCOUNTER — Encounter: Payer: Self-pay | Admitting: Hematology

## 2019-02-27 LAB — CANCER ANTIGEN 27.29: CA 27.29: 10.5 U/mL (ref 0.0–38.6)

## 2019-02-27 MED ORDER — NERATINIB MALEATE 40 MG PO TABS: 240.0000 mg | ORAL_TABLET | Freq: Every day | ORAL | 0 refills | Status: DC

## 2019-02-27 MED ORDER — BUDESONIDE ER 9 MG PO TB24: 9.0000 mg | ORAL_TABLET | Freq: Every day | ORAL | 1 refills | Status: DC

## 2019-03-01 ENCOUNTER — Telehealth: Payer: Self-pay

## 2019-03-01 ENCOUNTER — Telehealth: Payer: Self-pay | Admitting: Pharmacist

## 2019-03-01 DIAGNOSIS — Z170 Estrogen receptor positive status [ER+]: Principal | ICD-10-CM

## 2019-03-01 DIAGNOSIS — C50411 Malignant neoplasm of upper-outer quadrant of right female breast: Secondary | ICD-10-CM

## 2019-03-01 NOTE — Telephone Encounter (Signed)
Oral Oncology Patient Advocate Encounter  Received notification from [facility] that prior authorization for Nerlynx is required.  PA submitted on CoverMyMeds Key [id number] Status is pending  Oral Oncology Clinic will continue to follow.  [place] Patient [place] Phone [phone] Fax [phone] 03/01/2019    10:09 AM

## 2019-03-01 NOTE — Telephone Encounter (Addendum)
Oral Oncology Patient Advocate Encounter  Nerlynx #150 for 30 days, copay is $0.  [place] Patient [place] Phone [phone] Fax [phone] 03/01/2019   11:28 AM

## 2019-03-01 NOTE — Telephone Encounter (Signed)
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 Nerlynx plan start date 03/22/2019  Original diagnosis in July 2018 Patient is s/p bilateral mastectomies in September 2018 Patient received adjuvant systemic chemotherapy with [facility] October 2018-January 2019, course was complicated by cytopenias, neutropenic fever and admission for sepsis Patient also experienced decrease in LVEF with HER-2 directed therapy during concurrent chemotherapy phase and during maintenance phase Patient received adjuvant radiation for 07/07/2018-04/09/2018 and is also receiving adjuvant endocrine therapy with ovarian suppression and an aromatase inhibitor (currently on anastrozole) Last infusions of trastuzumab and Pertuzumab completed on 01/22/2019 Patient is now under evaluation to initiate extended adjuvant treatment with neratinib for 1 year of therapy  Labs from 02/26/2019 assessed, okay for treatment initiation. SCr=1.18, est CrCl ~ 70 mL/min, no dose adjustment per manufacturer  Current medication list in Epic reviewed, no significant DDIs with Nerlynx identified.  Patient will be counseled to separate Nerlynx administration from GI cocktail containing Maalox as increases in gastric pH will negatively affected Nerlynx absorption.  Noted prescription for budesonide sent to [facility] outpatient pharmacy. We will discuss antidiarrheal prophylaxis with Nerlynx administration with patient during initial counseling session. Patient will be provided manufacturer voucher for entire cost of 3 antidiarrheal agents for up to 3 months.  Prescription for Nerlynx will be e-scribed to appropriate specialty pharmacy for dispensing once insurance authorization is approved. Nerlynx is not available  for dispensing at the [facility] as it is a limited distribution medication.  Oral Oncology Clinic will continue to follow for insurance authorization, copayment issues, initial counseling and start date.  Johny Drilling, PharmD, BCPS, BCOP 03/01/2019 7:46 AM Oral Oncology Clinic [phone]

## 2019-03-01 NOTE — Telephone Encounter (Signed)
Oral Oncology Patient Advocate Encounter  Prior Authorization for Nerlynx has been approved.    PA# [id number] Effective dates: 03/01/19 through 02/28/20  Oral Oncology Clinic will continue to follow.   [place] Patient [place] Phone [phone] Fax [phone] 03/01/2019    10:47 AM

## 2019-03-02 NOTE — Telephone Encounter (Signed)
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 Nerlynx plan start date 03/22/2019  Last infusions of trastuzumab and Pertuzumab completed on 01/22/2019   Counseled patient on administration, dosing, side effects, monitoring, drug-food interactions, safe handling, storage, and disposal.  Prescribing information for Nerlynx recommends dose initiation at full dose, 6 tablets (240 mg) once daily, with the use of scheduled loperamide antidiarrheal prophylaxis for the first 8 weeks of therapy.  Nerlynx will be initiated on a dose titration schedule. Antidiarrheal prophylaxis will not be used, anti-diarrheal agents will be used reflexively.   Patient will use loperamide and Lomotil as needed if symptoms of diarrhea occur. Patient will initiate budesonide 109m once daily with Nerlynx start.   Week 1: Patient will take Nerlynx 427mtablets, 4 tablets (16046mby mouth once daily with food Week 2: Patient will take Nerlynx 44m47mblets, 5 tablets (200mg76m mouth once daily with food Week 3 and on: Patient will take Nerlynx 44mg 68mets, 6 tablets (244mg) 38mouth once daily with food. 240 mg once daily is the target dose of Nerlynx.  Patient knows to avoid grapefruit and grapefruit juice while on treatment with Nerlynx.  Patient instructed to avoid use of PPIs or H2RAs while on treatment with Nerlynx, can separate antacids from Nerlynx by 3 hours if acid suppression is needed.  Nerlynx start date: 03/22/2019  Adverse effects include but are not limited to: diarrhea, nausea, vomiting, mouth sores, fatigue, rash, and abdominal pain.    Patient tends towards constipation prior to chemotherapy initiation. Patient instructed to increase or decrease loperamide  dose to maintain 1-2 bowel movements per day.  Reviewed with patient importance of keeping a medication schedule and plan for any missed doses.  Ms. Lamarche vMckeel understanding and appreciation.   All questions answered. Medication reconciliation performed and medication/allergy list updated.  Insurance authorization of Nerlynx has been approved. I will plan to send Nerlynx prescription to [facility] on 03/09/2019. Patient knows to expect a call from them by the end of that week to schedule 1st shipment of Nerlynx. Copayment for Nerlynx is $0 at this time. If copayment increases when insurance year rolls over we will be ale to secure manufacturer copayment coupon to cover out of pocket expenses for Nerlynx.  I will work with Dr. Feng toBurr Medicod budesonide, loperamide, and [facility] [address] [facility], I will provide anti-diarrheal voucher information to pharmacy to cover costs of 3 anti-diarrheal medications for 3 months.  Patient knows to call the office with questions or concerns. Oral Oncology Clinic will continue to follow.  Jesse MJohny DrillingD, BCPS, BCOP  03/02/2019   4:30 PM Oral Oncology Clinic [phone]

## 2019-03-02 NOTE — Telephone Encounter (Signed)
Oral Chemotherapy Pharmacist Encounter   Attempted to reach patient to provide update and offer for initial counseling on oral medication: Nerlynx.  No answer. Left VM for patient to call back to discuss medication acquisition and initial counseling.  Nerlynx is a limited distribution medication and is not available for dispensing from the [place].  Noted Nerlynx start date planned for 03/22/2019. I will wait until I am able to connect with patient prior to sending prescription to a pharmacy for dispensing. prescription will likely be sent to [facility] at that time as they are contracted to fill for [employer].   I will follow-up with patient for best dispensing pharmacy for budesonide.  Johny Drilling, PharmD, BCPS, BCOP  03/02/2019   10:49 AM Oral Oncology Clinic [phone]

## 2019-03-04 ENCOUNTER — Other Ambulatory Visit: Payer: Self-pay | Admitting: Hematology

## 2019-03-04 MED ORDER — DIPHENOXYLATE-ATROPINE 2.5-0.025 MG PO TABS: 1.0000 | ORAL_TABLET | Freq: Four times a day (QID) | ORAL | 3 refills | Status: DC | PRN

## 2019-03-04 MED ORDER — BUDESONIDE ER 9 MG PO TB24: 9.0000 mg | ORAL_TABLET | Freq: Every day | ORAL | 2 refills | Status: DC

## 2019-03-04 MED ORDER — LOPERAMIDE HCL 2 MG PO CAPS: 2.0000 mg | ORAL_CAPSULE | Freq: Three times a day (TID) | ORAL | 0 refills | Status: DC | PRN

## 2019-03-04 NOTE — Telephone Encounter (Signed)
Oral Oncology Pharmacist Encounter  Prescriptions for budesonide, Lomotil, and Imodium [facility] [address] I called the pharmacy and provided manufacturer voucher information to cover the cost of all 3 prescriptions.  ID: [id number] BIN: [id number] PCN: [id number] Group: [id number]  They are able to process all 3 prescriptions on this voucher and returned $0 additional copayment from the patient. I requested all 3 medications be filled at this time and for them to call patient when they are ready for pick-up.  I called patient and alerted her to above. She will pick up her anti-diarrheal medications when [facility] that [facility] are ready.  She will initiate budesonide 9mg  daily with the start of Nerlynx. She will use Imodium and Lomotil as need to titrate to 1-2 bowel movements daily.  Patient reminded I will be sending Nerlynx prescription to [facility] pharmacy on 4/21 and she should hear from them to schedule her 1st shipment of Nerlynx by the end of next week.  Patient expressed understanding and appreciation. No other questions at thus time.  She knows to call the office with any additional questions or concerns.  Johny Drilling, PharmD, BCPS, BCOP  03/04/2019 2:31 PM Oral Oncology Clinic [phone]

## 2019-03-08 NOTE — Telephone Encounter (Signed)
Sheila [name], could you let pt call us when she receives the medication? I will set up some phone visits with her when she is on Nerlynx, thanks   [name] MD

## 2019-03-09 MED ORDER — NERATINIB MALEATE 40 MG PO TABS: ORAL_TABLET | ORAL | 0 refills | Status: DC

## 2019-03-09 NOTE — Telephone Encounter (Signed)
Oral Oncology Pharmacist Encounter  Discussed Nerlynx start date with MD. Start date preference now 03/15/19.  Nerlynx Rx for 1st month's titration schedule has been e-scribed to [facility].  Supporting information including demographic info, insurance info, insurance authorization, and medication list have been faxed to dispensing pharmacy.  I will follow-up prescription status this afternoon and update patient about start date.  Johny Drilling, PharmD, BCPS, BCOP  03/09/2019 8:37 AM Oral Oncology Clinic [phone]

## 2019-03-09 NOTE — Telephone Encounter (Signed)
Lacie, I would like you to manage her Nerlynx AEs. Please set up weekly phone visit for the first 1-6 weeks after she starts, and see her on 5/8 when she comes in for lab and injection, if you agree. Thanks much.   [name] MD

## 2019-03-09 NOTE — Telephone Encounter (Signed)
Oral Oncology Pharmacist Encounter  Confirmed with [facility] pharmacy that Nerlynx prescription is being processed. They should be reaching out to patient today to schedule delivery of 1st fill of Nerlynx. She could receive it as early as tomorrow.  Nerlynx start date: 03/15/19  Patient is agreeable to Nerlynx start date change. She has already picked up anti diarrheal medications and knows how to use them id diarrhea occurs. I confirmed office visits for 03/26/2019.  Patient knows to call the office with any additional questions or concerns.  Johny Drilling, PharmD, BCPS, BCOP  03/09/2019 9:46 AM Oral Oncology Clinic [phone]

## 2019-03-09 NOTE — Telephone Encounter (Signed)
Yes, of course. I'll send schedule messages to get this arranged. Thanks, Regan Rakers

## 2019-03-10 ENCOUNTER — Telehealth: Payer: Self-pay | Admitting: Nurse Practitioner

## 2019-03-10 NOTE — Telephone Encounter (Signed)
Scheduled appt per 4/21 sch message - pt is aware of appt date and time   

## 2019-03-17 NOTE — Telephone Encounter (Signed)
Oral Oncology Patient Advocate Encounter  Confirmed with [facility] that the Nerlynx was delivered to the patient on 03/10/19 with a $0 copay.  [place] Patient [place] Phone [phone] Fax [phone] 03/17/2019   9:26 AM

## 2019-03-18 ENCOUNTER — Ambulatory Visit (HOSPITAL_COMMUNITY)
Admission: RE | Admit: 2019-03-18 | Discharge: 2019-03-18 | Disposition: A | Discharge status: Home / Self Care | Payer: BLUE CROSS/BLUE SHIELD | Source: Ambulatory Visit | Attending: Family Medicine | Admitting: Family Medicine

## 2019-03-18 ENCOUNTER — Other Ambulatory Visit: Payer: Self-pay

## 2019-03-18 ENCOUNTER — Encounter (HOSPITAL_COMMUNITY): Payer: BLUE CROSS/BLUE SHIELD | Admitting: Cardiology

## 2019-03-18 DIAGNOSIS — C50411 Malignant neoplasm of upper-outer quadrant of right female breast: Secondary | ICD-10-CM | POA: Diagnosis not present

## 2019-03-18 DIAGNOSIS — Z170 Estrogen receptor positive status [ER+]: Secondary | ICD-10-CM | POA: Insufficient documentation

## 2019-03-18 DIAGNOSIS — I429 Cardiomyopathy, unspecified: Secondary | ICD-10-CM | POA: Diagnosis not present

## 2019-03-18 NOTE — Progress Notes (Signed)
  Echocardiogram 2D Echocardiogram has been performed.  Sheila [name] 03/18/2019, 1:56 PM

## 2019-03-22 ENCOUNTER — Telehealth: Payer: Self-pay | Admitting: Nurse Practitioner

## 2019-03-22 ENCOUNTER — Inpatient Hospital Stay: Payer: BLUE CROSS/BLUE SHIELD | Attending: Nurse Practitioner | Admitting: Nurse Practitioner

## 2019-03-22 ENCOUNTER — Encounter: Payer: Self-pay | Admitting: Nurse Practitioner

## 2019-03-22 DIAGNOSIS — M9510 Cauliflower ear, unspecified ear: Secondary | ICD-10-CM

## 2019-03-22 DIAGNOSIS — Z170 Estrogen receptor positive status [ER+]: Secondary | ICD-10-CM

## 2019-03-22 DIAGNOSIS — R232 Flushing: Secondary | ICD-10-CM | POA: Insufficient documentation

## 2019-03-22 DIAGNOSIS — M549 Dorsalgia, unspecified: Secondary | ICD-10-CM | POA: Insufficient documentation

## 2019-03-22 DIAGNOSIS — M5136 Other intervertebral disc degeneration, lumbar region: Secondary | ICD-10-CM | POA: Insufficient documentation

## 2019-03-22 DIAGNOSIS — C50411 Malignant neoplasm of upper-outer quadrant of right female breast: Secondary | ICD-10-CM | POA: Diagnosis not present

## 2019-03-22 DIAGNOSIS — Z79899 Other long term (current) drug therapy: Secondary | ICD-10-CM

## 2019-03-22 DIAGNOSIS — M25562 Pain in left knee: Secondary | ICD-10-CM | POA: Insufficient documentation

## 2019-03-22 DIAGNOSIS — R918 Other nonspecific abnormal finding of lung field: Secondary | ICD-10-CM | POA: Diagnosis not present

## 2019-03-22 DIAGNOSIS — Z923 Personal history of irradiation: Secondary | ICD-10-CM

## 2019-03-22 DIAGNOSIS — Z9013 Acquired absence of bilateral breasts and nipples: Secondary | ICD-10-CM

## 2019-03-22 DIAGNOSIS — Z9221 Personal history of antineoplastic chemotherapy: Secondary | ICD-10-CM | POA: Diagnosis not present

## 2019-03-22 DIAGNOSIS — Z79811 Long term (current) use of aromatase inhibitors: Secondary | ICD-10-CM | POA: Insufficient documentation

## 2019-03-22 DIAGNOSIS — R110 Nausea: Secondary | ICD-10-CM | POA: Insufficient documentation

## 2019-03-22 DIAGNOSIS — R197 Diarrhea, unspecified: Secondary | ICD-10-CM | POA: Insufficient documentation

## 2019-03-22 DIAGNOSIS — R200 Anesthesia of skin: Secondary | ICD-10-CM | POA: Insufficient documentation

## 2019-03-22 DIAGNOSIS — R5383 Other fatigue: Secondary | ICD-10-CM | POA: Insufficient documentation

## 2019-03-22 DIAGNOSIS — Z7951 Long term (current) use of inhaled steroids: Secondary | ICD-10-CM | POA: Insufficient documentation

## 2019-03-22 DIAGNOSIS — N939 Abnormal uterine and vaginal bleeding, unspecified: Secondary | ICD-10-CM | POA: Insufficient documentation

## 2019-03-22 DIAGNOSIS — M25561 Pain in right knee: Secondary | ICD-10-CM | POA: Insufficient documentation

## 2019-03-22 DIAGNOSIS — R682 Dry mouth, unspecified: Secondary | ICD-10-CM | POA: Insufficient documentation

## 2019-03-22 DIAGNOSIS — G4700 Insomnia, unspecified: Secondary | ICD-10-CM | POA: Insufficient documentation

## 2019-03-22 MED ORDER — PROCHLORPERAZINE MALEATE 10 MG PO TABS: 10.0000 mg | ORAL_TABLET | Freq: Four times a day (QID) | ORAL | 3 refills | Status: DC | PRN

## 2019-03-22 NOTE — Telephone Encounter (Signed)
Scheduled appt per 5/4 los. °

## 2019-03-22 NOTE — Progress Notes (Signed)
[place]   [phone]) [phone] [phone]) [phone]   Clinic Follow up Note   Patient Care Team: [name], MD as PCP - General (Family Medicine) [name] (Family Medicine) [name], MD as Consulting Physician (General Surgery) [name], MD as Consulting Physician (Hematology) Kyung Rudd, MD as Consulting Physician (Radiation Oncology) 03/22/2019  CHIEF COMPLAINT: phone f/u toxicity check   I connected with Sheila [name] on 12/21/7626 at 1:40 PM EDT by telephone and verified that I am speaking with the correct person using two identifiers. The patient was accompanied by her spouse. I discussed the limitations, risks, security and privacy concerns of performing an evaluation and management service by telephone and the availability of in person appointments. I explained there may be a charge associated with today's encounter. The patient expressed understanding and agreed to proceed.   Patient location: home Provider location: [facility] office     SUMMARY OF ONCOLOGIC HISTORY: Oncology History    Cancer Staging Malignant neoplasm of upper-outer quadrant of right breast in female, estrogen receptor positive ([place]) Staging form: Breast, AJCC 8th Edition - Clinical stage from 06/11/2017: Stage IB (cT3, cN0, cM0, G2, ER: Positive, PR: Positive, HER2: Positive) - Signed by [name], MD on 06/16/2017 - Pathologic stage from 07/22/2017: Stage IB (pT3(m), pN2a(sn), cM0, G2, ER: Positive, PR: Positive, HER2: Positive) - Signed by [name], NP on 08/04/2017      Malignant neoplasm of upper-outer quadrant of right breast in female, estrogen receptor positive ([place])   06/11/2017 Initial Biopsy    Diagnosis 06/11/17 Breast, right, needle core biopsy, 11:00 o'clock - INVASIVE DUCTAL CARCINOMA, G2    06/11/2017 Mammogram    [name] and [facility] Diagnostic Breast Tomo Bilateral 06/11/17 IMPRESSION: 1. Highly suspicious mass within the retroareolar right breast,  extending from the 8:00 to 11:00 axes, anterior to posterior depth, extending anteriorly to the nipple with associated nipple retraction, measuring at least 5.5 cm by ultrasound  2. No sonographic evidence of metastatic lymphadenopathy in the right axilla. 3. No evidence of malignancy within the left breast.     06/11/2017 Initial Diagnosis    Malignant neoplasm of upper-outer quadrant of right breast in female, estrogen receptor positive ([place])    06/11/2017 Receptors her2    Estrogen Receptor: 100%, POSITIVE, STRONG STAINING INTENSITY Progesterone Receptor: 100%, POSITIVE, STRONG STAINING INTENSITY Proliferation Marker Ki67: 12%Proliferation Marker Ki67: 12%  HER2 - **POSITIVE** RATIO OF HER2/CEP17 SIGNALS 2.55 AVERAGE HER2 COPY NUMBER PER CELL 4.85    06/25/2017 Genetic Testing    Negative genetic testing on the 9 gene STAT panel.  The STAT Breast cancer panel offered by [facility] includes sequencing and rearrangement analysis for the following 9 genes:  ATM, BRCA1, BRCA2, CDH1, CHEK2, PALB2, PTEN, STK11 and TP53.   The report date is June 25, 2017.  Negative genetic testing on the common hereditary cancer panel.  The Hereditary Gene Panel offered by [facility] includes sequencing and/or deletion duplication testing of the following 46 genes: APC, ATM, AXIN2, BARD1, BMPR1A, BRCA1, BRCA2, BRIP1, CDH1, CDKN2A (p14ARF), CDKN2A (p16INK4a), CHEK2, CTNNA1, DICER1, EPCAM (Deletion/duplication testing only), GREM1 (promoter region deletion/duplication testing only), KIT, MEN1, MLH1, MSH2, MSH3, MSH6, MUTYH, NBN, NF1, NHTL1, PALB2, PDGFRA, PMS2, POLD1, POLE, PTEN, RAD50, RAD51C, RAD51D, SDHB, SDHC, SDHD, SMAD4, SMARCA4. STK11, TP53, TSC1, TSC2, and VHL.  The following genes were evaluated for sequence changes only: SDHA and HOXB13 c.251G>A variant only.  The report date is June 25, 2017.     07/02/2017 Imaging  CT cAP 07/02/17 IMPRESSION: 1. Subareolar right breast mass. No compelling findings of nodal  or metastatic involvement. 2. Old granulomatous disease. 3. There is a 7 mm enhancing or hyperdense focus posteriorly in segment 7 of the liver in the subcapsular region. By virtue of its small size this lesion is technically nonspecific although statistically likely to be a small benign lesions such as flash filling hemangioma. This may warrant surveillance. 4.  Prominent stool throughout the colon favors constipation. 5. Degenerative disc disease and spondylosis at L5-S1 likely causing mild impingement.    07/02/2017 Imaging    Bone scan 07/02/17 IMPRESSION: Today' s exam is negative. I ascribed the tiny focus of activity just proximal to the left antecubital region to injection site.    07/22/2017 Surgery    RIGHT TOTAL MASTECTOMY WITH RIGHT AXILLARY SENTINEL LYMPH NODE BIOPSY, LEFT PROPHYLACTIC MASTECTOM and INSERTION PORT-A-CATH WITH [name] by [name] and [facility]     07/22/2017 Pathology Results    Diagnosis 1. Breast, simple mastectomy, Left - FIBROADENOMA. - NO MALIGNANCY IDENTIFIED. 2. 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 - ADDITIONAL FOCUS OF LOBULAR CARCINOMA, GRADE 2, SPANNING 2.2 CM. - INTERMEDIATE GRADE DUCTAL CARCINOMA IN SITU. - INVASIVE CARCINOMA COMES TO WITHIN 0.2 TO 0.3 CM OF THE DEEP MARGIN, FOCALLY. - TUMOR FOCALLY INVOLVES EPIDERMIS. - LYMPHOVASCULAR INVASION OF DERMAL LYMPHATICS. - ONE OF ONE LYMPH NODES NEGATIVE FOR CARCINOMA (0/1). - SEE ONCOLOGY TABLE. 3. Lymph node, sentinel, biopsy, Right axillary - ONE LYMPH NODE WITH ISOLATED TUMOR CELLS (0/1). 4. Lymph node, sentinel, biopsy, Right - METASTATIC CARCINOMA IN ONE OF ONE LYMPH NODES (1/1). 5. Lymph node, sentinel, biopsy, Right - ONE OF ONE LYMPH NODES NEGATIVE FOR CARCINOMA (0/1). 6. Lymph node, sentinel, biopsy, Right - METASTATIC CARCINOMA IN ONE OF ONE LYMPH NODES (1/1). - EXTRACAPSULAR EXTENSION. 7. Lymph node, sentinel,  biopsy, Right - METASTATIC CARCINOMA IN ONE OF ONE LYMPH NODES (1/1). - EXTRACAPSULAR EXTENSION. 8. Lymph node, sentinel, biopsy, Right - ONE OF ONE LYMPH NODES NEGATIVE FOR CARCINOMA (0/1). 9. Lymph node, sentinel, biopsy, Right - ONE OF ONE LYMPH NODES NEGATIVE FOR CARCINOMA (0/1). 10. Lymph node, sentinel, biopsy, Right - METASTATIC CARCINOMA IN ONE OF ONE LYMPH NODES (1/1). - EXTRACAPSULAR EXTENSION. 11. Lymph node, biopsy, Right axillary - ONE OF ONE LYMPH NODES NEGATIVE FOR CARCINOMA (0/1).     07/22/2017 Receptors her2    Ductal carcinoma in primary breast tumor and node metastasis are ER 95%, PR 95% strongly positive, and HER2 + The lobular component in primary breast tumor is ER 95% positive, PR 95% positive, strong staining, HER-2 negative.   Original biopsy [id number]: Ductal morphology, Her2 FISH positive. Current specimen: Main focus is 75% ductal morphology, 25% lobular morphology. Smaller focus is lobular morphology. #2A Lobular morphology, Her IHC negative. #2E Lobular morphology, Her2 FISH negative, Her2 IHC negative. #4 50% lobular and 50% ductal morphology, Her2 FISH positive, Her2 IHC negative. #6 Ductal morphology #7 Ductal morphology #10 50% lobular, 50% ductal morphology    08/25/2017 Imaging    MRI abdomen done at [facility]:  Enhancing structure measing 5 mm is compatible with a flash filling hemangioma. No specific findings to suggest metastatic cancer    08/25/2017 Imaging    MRI Abdomen at [facility] 08/25/17  IMPRESSION: 1. Lesion in segment 7 of the liver has signal and enhancement characteristics compatible with a flash filling hemangioma. No specific findings identified to suggest metastatic disease.  08/29/2017 - 12/12/2017 Chemotherapy    adjuvant TCHP every 3 weeks for 6 cycles starting 08/29/17. Postponed and decreased Carbo to '650mg'$  and added Onpro with cycle 2 on 09/19/17 due to neutropenic fever. Carbplatin further reduced to '500mg'$  starting with  cycle 3 due to thrombocytopenia. Herceptin was held for Cycle 4 and 5 due to worsening global longitudinal strain found on ECHO from 11/07/17. Completed 6 cycles on 12/12/17       09/04/2017 - 09/09/2017 Hospital Admission    Admit date: 09/04/2017 Discharge date: 09/09/2017  DISCHARGE DIAGNOSES:  Principal Problem:   Sepsis [facility]) Active Problems:   Malignant neoplasm of upper-outer quadrant of right breast in female, estrogen receptor positive ([place])   Port-A-Cath in place   Neutropenic fever ([place])   Hyponatremia   Malignant neoplasm of breast in female, estrogen receptor positive ([place])     12/28/2017 Imaging    CT CHEST IMPRESSION: 1. 2.3 x 2.7 x 1.2 cm irregular masslike opacity within the right middle lobe worrisome for malignancy/metastasis. 2. 3 new very small sclerotic lesions, within the sternum and the body of T4, worrisome for new metastases.    12/28/2017 - 01/05/2018 Hospital Admission    Admit date: 12/28/17-01/05/18 Admission diagnosis: multifocal pneumonia, influenza A, sepsis, hypoxia Additional comments: imaging during hospitalization notable for new irregular masslike opacity in the right middle lobe worrisome for metastasis and new very small sclerotic lesion within the sternum and the body of T4, worrisome for metastasis    12/29/2017 Imaging    CT ABD/PELVIS IMPRESSION: No definite acute intra-abdominal or intrapelvic abnormalities.  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    12/30/2017 Imaging    CT HEAD IMPRESSION: No acute intracranial abnormality or enhancing lesion.    01/16/2018 - 01/22/2019 Chemotherapy    Maintence Herceptin and Perjeta every 3 weeks for 6-12 months starting 01/16/18. Held 03/2018-07/17/18 due to low EF. Completed on 01/22/19     02/04/2018 PET scan    IMPRESSION: 1. The previous consolidation in the right middle lobe currently has a more nodular and reticular appearance with some associated  mild atelectasis along its anterior margin. This demonstrates low-grade metabolic activity with maximum SUV 3.2 (background blood pool activity is 2.7). Although conceivably related to low-grade residuum from the patient's malignancy, the presence of calcified right hilar lymph nodes and small calcifications along the nodularity raise suspicion for a low-grade active granulomatous process or atypical infectious process. Surveillance is recommended. 2. Scattered deposits of hypermetabolic but benign metabolically active brown fat.    02/23/2018 - 04/09/2018 Radiation Therapy    Radiation treatment dates:   02/23/2018 - 04/09/2018  Site/dose:   The patient initially received a dose of 50.4 Gy in 28 fractions to the right chest wall and supraclavicular region. This was delivered using a 3-D conformal, 4 field technique. The patient then received a boost to the mastectomy scar. This delivered an additional 10 Gy in 5 fractions using an en face electron field. The total dose was 60.4 Gy.  Narrative: The patient tolerated radiation treatment relatively well.   The patient had some expected skin irritation as she progressed during treatment. Moist desquamation was not present at the end of treatment.    03/20/2018 -  Anti-estrogen oral therapy    -Monthly Zoladex injection starting 03/20/18  -Letrozole 2.'5mg'$  once dialy starting 05/03/18  -Switched to Anastrozole starting 07/2018 Due to her worsening back pain.    04/06/2018 Imaging    CT Chest wo contrast  IMPRESSION: 1. Stable linear nodular thickening in the RIGHT middle lobe is favored post infectious or inflammatory scarring. 2. Calcified RIGHT hilar lymph node is consistent post infectious process. 3. Stable small LEFT upper lobe nodule. 4. Stable sclerotic lesion in the manubrium. Sclerotic lesion at T3 is more prominent (4 mm). Sclerotic lesion at L 1 is new from 07/02/2017.    04/06/2018 Imaging    04/06/2018 CT Chest IMPRESSION: 1.  Stable linear nodular thickening in the RIGHT middle lobe is favored post infectious or inflammatory scarring. 2. Calcified RIGHT hilar lymph node is consistent post infectious process. 3. Stable small LEFT upper lobe nodule. 4. Stable sclerotic lesion in the manubrium. Sclerotic lesion at T3 is more prominent (4 mm). Sclerotic lesion at L 1 is new from 07/02/2017.    04/10/2018 Echocardiogram    LV EF 45%-50%    05/25/2018 Echocardiogram    LV EF 45%-50%    07/13/2018 Echocardiogram    Normal LV size with EF 55-60%    07/31/2018 Imaging    07/31/2018 Bone Scan IMPRESSION: No evidence of skeletal metastatic disease.    07/31/2018 Imaging    07/31/2018 CT CAP IMPRESSION: Residual linear and nodular peribronchovascular opacities in the right middle lobe, with other scattered areas of ground-glass opacities in the right middle, right upper and right lower lobes, which are located within the previously demonstrated bulky airspace consolidation. The findings are improved from the chest CT dated 12/30/2017, and relatively stable from PET-CT dated 02/04/2018. These may represent post infectious/post inflammatory changes or residual malignancy.  Stable partially calcified right hilar lymphadenopathy.  Interval development of nodular soft tissue thickening within the anterior mediastinum in the area of the thymus, new from chest CT dated 12/30/2017. This may represent reactive thymic tissue, thymoma or lymphadenopathy.  1.8 cm probably physiologic left ovarian cyst. Borderline thickening of the endometrium measuring 15.6 mm. Further evaluation with pelvic ultrasound may be considered.  No new or suspicious findings within the abdomen.    09/15/2018 Echocardiogram    EF 55-60%    01/28/2019 Imaging    MRI Lumbar spie 01/28/19  IMPRESSION: 1. Subcentimeter lesions in the L1 and L2 vertebral bodies, indeterminate for metastases. 2. Mild lower lumbar disc degeneration without  stenosis.    01/28/2019 Imaging    CT chest 01/28/19  IMPRESSION: Right lung post radiation changes. No active lung disease or other acute findings.  No evidence of recurrent or metastatic carcinoma within the thorax.     CURRENT THERAPY:  -Monthly Zoladex injections starting 03/20/18  -Letrozole 2.'5mg'$  once daily starting 05/03/18, changed to anastrozole in 07/2018 -Neratinib 240 mg daily, starting 03/15/19   INTERVAL HISTORY: Ms. Boggess presents via phone for Neratinib toxicity check. She started on 4/27 with 160 mg daily. On day 3-4 she developed mild nausea without vomiting. She takes compazine once per day to every other day. She remains able to eat and drink adequately. Mouth is dry, denies mucositis. She developed frequent loose stool on day 4, has been watery twice. No blood in stool. She takes lomotil once per day. BM frequency varies, but one day during the first week she had 6-7 stools. On day 4-5 she noticed increased fatigue and slept a lot. She remains able to function normally otherwise. She is occasionally dizzy on standing. She had a transient episode of chest pressure while standing around at home on 5/1, lasted 20 minutes and did not return. Denies cough, dyspnea, fever, swelling. She had recent echo and is waiting to hear  from Dr. Aundra Dubin. She has mild to moderate bone pain in knees especially when she goes up and down stairs, started 1 week ago. Denies vaginal dryness. Mood is stable. Denies skin rash. She increased neratinib to 200 mg today.   REVIEW OF SYSTEMS:   Constitutional: Denies fevers, chills or abnormal weight loss (+) fatigue  Eyes: Denies blurriness of vision Ears, nose, mouth, throat, and face: Denies mucositis or sore throat (+) dry mouth  Respiratory: Denies cough, dyspnea or wheezes Cardiovascular: Denies palpitation, chest discomfort or lower extremity swelling (+) transient chest pressure 03/19/19  Gastrointestinal:  Denies vomiting, constipation, GI bleeding,  heartburn (+) change in bowel habits (+) lose frequent stool (+) intermittent nausea  GU/GYN: denies dysuria or vaginal dryness  Skin: Denies abnormal skin rashes Lymphatics: Denies new lymphadenopathy or easy bruising Neurological:Denies numbness, tingling or new weaknesses (+) occasional dizziness on standing  Behavioral/Psych: Mood is stable, no new changes  All other systems were reviewed with the patient and are negative.  MEDICAL HISTORY:  Past Medical History:  Diagnosis Date   Anxiety    Cancer ([place])    right breast    Cardiomyopathy ([place])    Family history of breast cancer    Family history of colon cancer    GERD (gastroesophageal reflux disease)    Headache    couple of migraines in the past   History of kidney stones    Vitamin D deficiency     SURGICAL HISTORY: Past Surgical History:  Procedure Laterality Date   egg donation     kidney stone removal     MASTECTOMY W/ SENTINEL NODE BIOPSY Bilateral 07/22/2017   Procedure: RIGHT TOTAL MASTECTOMY WITH RIGHT AXILLARY SENTINEL LYMPH NODE BIOPSY, LEFT PROPHYLACTIC MASTECTOMY;  Surgeon: [name], MD;  Location: [place];  Service: General;  Laterality: Bilateral;   PORTACATH PLACEMENT Right 07/22/2017   Procedure: INSERTION PORT-A-CATH WITH [name];  Surgeon: [name], MD;  Location: [place];  Service: General;  Laterality: Right;    I have reviewed the social history and family history with the patient and they are unchanged from previous note.  ALLERGIES:  has No Known Allergies.  MEDICATIONS:  Current Outpatient Medications  Medication Sig Dispense Refill   anastrozole (ARIMIDEX) 1 MG tablet Take 1 tablet (1 mg total) by mouth daily. 30 tablet 11   B Complex-C (B-COMPLEX WITH VITAMIN C) tablet Take 1 tablet by mouth daily.     Biotin 1000 MCG tablet Take 1,000 mcg by mouth 3 (three) times daily.     Budesonide ER 9 MG TB24 Take 9 mg by mouth daily. Along with neratinib for the first 1-2  months 30 tablet 2   carvedilol (COREG) 3.125 MG tablet TAKE 1 TABLET BY MOUTH 2 TIMES DAILY. 60 tablet 5   Cholecalciferol (VITAMIN D) 2000 units tablet Take 2,000 Units by mouth daily.     diphenoxylate-atropine (LOMOTIL) 2.5-0.025 MG tablet Take 1-2 tablets by mouth 4 (four) times daily as needed for diarrhea or loose stools. 30 tablet 3   levothyroxine (SYNTHROID, LEVOTHROID) 25 MCG tablet Take 25 mcg by mouth daily before breakfast.     losartan (COZAAR) 25 MG tablet Take 0.5 tablets (12.5 mg total) by mouth at bedtime. 15 tablet 5   Melatonin 10 MG TABS Take 0.5 tablets by mouth.      naproxen sodium (ANAPROX) 220 MG tablet Take 220 mg 2 (two) times daily as needed by mouth (pain).      Neratinib Maleate (NERLYNX) 40  MG tablet UXLK4: Take 4 tab (168m) by mouth once daily. WMWNU2 Take 5 tab (2067m once daily. WeVOZD6 on: Take 6 tas (24030monce daily. Take w food 160 tablet 0   Omega-3 Fatty Acids (OMEGA-3 FISH OIL PO) Take 100 Units by mouth.     venlafaxine XR (EFFEXOR-XR) 75 MG 24 hr capsule Take 3 capsules (225 mg total) by mouth daily with breakfast. 90 capsule 5   cyclobenzaprine (FLEXERIL) 5 MG tablet Take 1 tablet (5 mg total) by mouth 2 (two) times daily as needed for muscle spasms. 30 tablet 0   loperamide (IMODIUM) 2 MG capsule Take 1-2 capsules (2-4 mg total) by mouth 3 (three) times daily as needed for diarrhea or loose stools. 120 capsule 0   Current Facility-Administered Medications  Medication Dose Route Frequency Provider Last Rate Last Dose   gi cocktail (Maalox,Lidocaine,Donnatal)  30 mL Oral Once TanHarle StanfordPA-C        OBJECTIVE:  Patient appears well over the phone. Speech is clear, non-pressured. Mood and behavior appear normal.   LABORATORY DATA:  I have reviewed the data as listed CBC Latest Ref Rng & Units 02/26/2019 01/22/2019 12/11/2018  WBC 4.0 - 10.5 K/uL 3.3(L) 3.1(L) 2.9(L)  Hemoglobin 12.0 - 15.0 g/dL 12.2 11.6(L) 11.7(L)  Hematocrit  36.0 - 46.0 % 36.8 34.2(L) 34.2(L)  Platelets 150 - 400 K/uL 179 164 181     CMP Latest Ref Rng & Units 02/26/2019 01/22/2019 12/11/2018  Glucose 70 - 99 mg/dL 125(H) 118(H) 107(H)  BUN 6 - 20 mg/dL _0 Creatinine 0.44 - 1.00 mg/dL 1.18(H) 1.03(H) 1.01(H)  Sodium 135 - 145 mmol/L 140 137 139  Potassium 3.5 - 5.1 mmol/L 4.1 4.0 4.3  Chloride 98 - 111 mmol/L 108 107 105  CO2 22 - 32 mmol/L _1 Calcium 8.9 - 10.3 mg/dL 8.8(L) 8.7(L) 9.3  Total Protein 6.5 - 8.1 g/dL 7.4 7.3 7.1  Total Bilirubin 0.3 - 1.2 mg/dL 0.4 0.4 0.5  Alkaline Phos 38 - 126 U/L 117 101 107  AST 15 - 41 U/L _2 ALT 0 - 44 U/L _3 RADIOGRAPHIC STUDIES: I have personally reviewed the radiological images as listed and agreed with the findings in the report. No results found.   ASSESSMENT & PLAN: Sheila [name] a [age]o. female with   1. Malignant neoplasm of upper-outer quadrant of right breast in female, mixed invasive ductal and lobular carcinoma, pT3(m)N2aM0, stage 1b, ductal carcinoma triple positive, lobular carcinoma ER+/PR+/HER2-, Grade 2 -She was diagnosed in 05/2017. She is s/pb/lmastectomy, adjuvant TCHP and radiation and maintenance Herceptin/Perjeta.  -Her genetic testing was negative. -She is currently on adjuvant anastrozole with monthlyZoladexinjections, tolerating well.Began AI and zoladex in 04/2018  -she is doing well, began Neratinib on 03/15/19. She tolerated first week with 160 mg moderately well, with mild nausea and lose stool. She titrated dose to 200 mg today for week 2.  -She is only taking lomotil once daily. I strongly encouraged her to increase lomotil and add imodium this week as she increases her Neratinib dose, to reduce diarrhea and prevent complications from that. She continues to drink well -she has occasional dizziness on standing. I recommend she check BP at home, change position carefully, and increase hydration  -I refilled compazine, she also  uses phenergan PRN. zofran does not work for her.  -Echo on 5/1 with EF 60-65%, will continue to monitor  -  Continue with Zoladex monthly  -Continue Anastrozole daily  -she will return this week for lab, in-person f/u, and injection   2. Insomnia  -She has history of insomnia failed multiple agents such as lunesta, trazodone, tylenol pm, and xanax and only had mild relief with Remeron. -takes melatonin 0.5 tab at night   3. Hot flash.  -Mild and tolerable. ContinueEffexor288m daily   4. Bone Health -on Calcium and vitamin D -plan to get DEXA done in 2020   5. DiffuseBack pain -she has had chronic back pain since undergoing chemotherapy; CT Chest on 04/06/18 showed sclerotic lesions of spine in T3 and L1, buther bone scan in 07/2018 was negative  -Letrozole was changed to anastrozole in September 2019due to arthralgia -MRI lumbar from 01/28/19 shows lesions of L1 and L2 indeterminate, will continue follow up   -She will continue Flexeril and aleve for pain PRN.  -as of 03/22/19 f/u she did not mention back pain but reports joint pain in her knees, especially when she goes up/down stairs; possibly related to AI -She can use a knee brace if needed. She will notify me if her pain worsens   6. Lung lesions  -seen on CT 12/2017 felt to be related to infection; nearly resolved on repeat CT 01/2019   PLAN: -Increase to 200 mg daily Neratinib for week 2 -Increase lomotil and imodium PRN for BM -knee brace for joint pain as needed  -in-person visit for lab, f/u, and zoladex injection on 5/7 -Refilled compazine  -Check BP at home, careful with position change, and increase hydration   All questions were answered. The patient knows to call the clinic with any problems, questions or concerns. No barriers to learning was detected. I provided 20 minutes non-face-to-face time during today's encounter.      LAlla Feeling NP 03/22/19

## 2019-03-24 NOTE — Progress Notes (Signed)
[place]   [phone] [phone]   Clinic Follow up Note   Patient Care Team: [name], MD as PCP - General (Family Medicine) [name] (Family Medicine) [name], MD as Consulting Physician (General Surgery) [name], MD as Consulting Physician (Hematology) Kyung Rudd, MD as Consulting Physician (Radiation Oncology) 03/25/2019  CHIEF COMPLAINT: f/u right breast cancer   SUMMARY OF ONCOLOGIC HISTORY: Oncology History    Cancer Staging Malignant neoplasm of upper-outer quadrant of right breast in female, estrogen receptor positive ([place]) Staging form: Breast, AJCC 8th Edition - Clinical stage from 06/11/2017: Stage IB (cT3, cN0, cM0, G2, ER: Positive, PR: Positive, HER2: Positive) - Signed by [name], MD on 06/16/2017 - Pathologic stage from 07/22/2017: Stage IB (pT3(m), pN2a(sn), cM0, G2, ER: Positive, PR: Positive, HER2: Positive) - Signed by [name], NP on 08/04/2017      Malignant neoplasm of upper-outer quadrant of right breast in female, estrogen receptor positive ([place])   06/11/2017 Initial Biopsy    Diagnosis 06/11/17 Breast, right, needle core biopsy, 11:00 o'clock - INVASIVE DUCTAL CARCINOMA, G2    06/11/2017 Mammogram    [name] [facility] Diagnostic Breast Tomo Bilateral 06/11/17 IMPRESSION: 1. Highly suspicious mass within the retroareolar right breast, extending from the 8:00 to 11:00 axes, anterior to posterior depth, extending anteriorly to the nipple with associated nipple retraction, measuring at least 5.5 cm by ultrasound  2. No sonographic evidence of metastatic lymphadenopathy in the right axilla. 3. No evidence of malignancy within the left breast.     06/11/2017 Initial Diagnosis    Malignant neoplasm of upper-outer quadrant of right breast in female, estrogen receptor positive ([place])    06/11/2017 Receptors her2    Estrogen Receptor: 100%, POSITIVE, STRONG STAINING INTENSITY Progesterone  Receptor: 100%, POSITIVE, STRONG STAINING INTENSITY Proliferation Marker Ki67: 12%Proliferation Marker Ki67: 12%  HER2 - **POSITIVE** RATIO OF HER2/CEP17 SIGNALS 2.55 AVERAGE HER2 COPY NUMBER PER CELL 4.85    06/25/2017 Genetic Testing    Negative genetic testing on the 9 gene STAT panel.  The STAT Breast cancer panel offered by [facility] includes sequencing and rearrangement analysis for the following 9 genes:  ATM, BRCA1, BRCA2, CDH1, CHEK2, PALB2, PTEN, STK11 and TP53.   The report date is June 25, 2017.  Negative genetic testing on the common hereditary cancer panel.  The Hereditary Gene Panel offered by [facility] includes sequencing and/or deletion duplication testing of the following 46 genes: APC, ATM, AXIN2, BARD1, BMPR1A, BRCA1, BRCA2, BRIP1, CDH1, CDKN2A (p14ARF), CDKN2A (p16INK4a), CHEK2, CTNNA1, DICER1, EPCAM (Deletion/duplication testing only), GREM1 (promoter region deletion/duplication testing only), KIT, MEN1, MLH1, MSH2, MSH3, MSH6, MUTYH, NBN, NF1, NHTL1, PALB2, PDGFRA, PMS2, POLD1, POLE, PTEN, RAD50, RAD51C, RAD51D, SDHB, SDHC, SDHD, SMAD4, SMARCA4. STK11, TP53, TSC1, TSC2, and VHL.  The following genes were evaluated for sequence changes only: SDHA and HOXB13 c.251G>A variant only.  The report date is June 25, 2017.     07/02/2017 Imaging    CT cAP 07/02/17 IMPRESSION: 1. Subareolar right breast mass. No compelling findings of nodal or metastatic involvement. 2. Old granulomatous disease. 3. There is a 7 mm enhancing or hyperdense focus posteriorly in segment 7 of the liver in the subcapsular region. By virtue of its small size this lesion is technically nonspecific although statistically likely to be a small benign lesions such as flash filling hemangioma. This may warrant surveillance. 4.  Prominent stool throughout the colon favors constipation. 5. 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    07/02/2017 Imaging    Bone scan  07/02/17 IMPRESSION: Today' s exam is negative. I ascribed the tiny focus of activity just proximal to the left antecubital region to injection site.    07/22/2017 Surgery    RIGHT TOTAL MASTECTOMY WITH RIGHT AXILLARY SENTINEL LYMPH NODE BIOPSY, LEFT PROPHYLACTIC MASTECTOM and INSERTION PORT-A-CATH WITH US by Dr. Wakefield and Thimmappa     07/22/2017 Pathology Results    Diagnosis 1. Breast, simple mastectomy, Left - FIBROADENOMA. - NO MALIGNANCY IDENTIFIED. 2. 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 - ADDITIONAL FOCUS OF LOBULAR CARCINOMA, GRADE 2, SPANNING 2.2 CM. - INTERMEDIATE GRADE DUCTAL CARCINOMA IN SITU. - INVASIVE CARCINOMA COMES TO WITHIN 0.2 TO 0.3 CM OF THE DEEP MARGIN, FOCALLY. - TUMOR FOCALLY INVOLVES EPIDERMIS. - LYMPHOVASCULAR INVASION OF DERMAL LYMPHATICS. - ONE OF ONE LYMPH NODES NEGATIVE FOR CARCINOMA (0/1). - SEE ONCOLOGY TABLE. 3. Lymph node, sentinel, biopsy, Right axillary - ONE LYMPH NODE WITH ISOLATED TUMOR CELLS (0/1). 4. Lymph node, sentinel, biopsy, Right - METASTATIC CARCINOMA IN ONE OF ONE LYMPH NODES (1/1). 5. Lymph node, sentinel, biopsy, Right - ONE OF ONE LYMPH NODES NEGATIVE FOR CARCINOMA (0/1). 6. Lymph node, sentinel, biopsy, Right - METASTATIC CARCINOMA IN ONE OF ONE LYMPH NODES (1/1). - EXTRACAPSULAR EXTENSION. 7. Lymph node, sentinel, biopsy, Right - METASTATIC CARCINOMA IN ONE OF ONE LYMPH NODES (1/1). - EXTRACAPSULAR EXTENSION. 8. Lymph node, sentinel, biopsy, Right - ONE OF ONE LYMPH NODES NEGATIVE FOR CARCINOMA (0/1). 9. Lymph node, sentinel, biopsy, Right - ONE OF ONE LYMPH NODES NEGATIVE FOR CARCINOMA (0/1). 10. Lymph node, sentinel, biopsy, Right - METASTATIC CARCINOMA IN ONE OF ONE LYMPH NODES (1/1). - EXTRACAPSULAR EXTENSION. 11. Lymph node, biopsy, Right axillary - ONE OF ONE LYMPH NODES NEGATIVE FOR CARCINOMA (0/1).     07/22/2017 Receptors her2    Ductal  carcinoma in primary breast tumor and node metastasis are ER 95%, PR 95% strongly positive, and HER2 + The lobular component in primary breast tumor is ER 95% positive, PR 95% positive, strong staining, HER-2 negative.   Original biopsy [id number]: Ductal morphology, Her2 FISH positive. Current specimen: Main focus is 75% ductal morphology, 25% lobular morphology. Smaller focus is lobular morphology. #2A Lobular morphology, Her IHC negative. #2E Lobular morphology, Her2 FISH negative, Her2 IHC negative. #4 50% lobular and 50% ductal morphology, Her2 FISH positive, Her2 IHC negative. #6 Ductal morphology #7 Ductal morphology #10 50% lobular, 50% ductal morphology    08/25/2017 Imaging    MRI abdomen done at [facility]:  Enhancing structure measing 5 mm is compatible with a flash filling hemangioma. No specific findings to suggest metastatic cancer    08/25/2017 Imaging    MRI Abdomen at [facility] 08/25/17  IMPRESSION: 1. Lesion in segment 7 of the liver has signal and enhancement characteristics compatible with a flash filling hemangioma. No specific findings identified to suggest metastatic disease.    08/29/2017 - 12/12/2017 Chemotherapy    adjuvant TCHP every 3 weeks for 6 cycles starting 08/29/17. Postponed and decreased Carbo to 650mg and added Onpro with cycle 2 on 09/19/17 due to neutropenic fever. Carbplatin further reduced to 500mg starting with cycle 3 due to thrombocytopenia. Herceptin was held for Cycle 4 and 5 due to worsening global longitudinal strain found on ECHO from 11/07/17. Completed 6 cycles on 12/12/17       09/04/2017 - 09/09/2017 Hospital Admission    Admit date: 09/04/2017 Discharge   date: 09/09/2017  DISCHARGE DIAGNOSES:  Principal Problem:   Sepsis (HCC) Active Problems:   Malignant neoplasm of upper-outer quadrant of right breast in female, estrogen receptor positive (HCC)   Port-A-Cath in place   Neutropenic fever (HCC)   Hyponatremia   Malignant neoplasm  of breast in female, estrogen receptor positive (HCC)     12/28/2017 Imaging    CT CHEST IMPRESSION: 1. 2.3 x 2.7 x 1.2 cm irregular masslike opacity within the right middle lobe worrisome for malignancy/metastasis. 2. 3 new very small sclerotic lesions, within the sternum and the body of T4, worrisome for new metastases.    12/28/2017 - 01/05/2018 Hospital Admission    Admit date: 12/28/17-01/05/18 Admission diagnosis: multifocal pneumonia, influenza A, sepsis, hypoxia Additional comments: imaging during hospitalization notable for new irregular masslike opacity in the right middle lobe worrisome for metastasis and new very small sclerotic lesion within the sternum and the body of T4, worrisome for metastasis    12/29/2017 Imaging    CT ABD/PELVIS IMPRESSION: No definite acute intra-abdominal or intrapelvic abnormalities.  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    12/30/2017 Imaging    CT HEAD IMPRESSION: No acute intracranial abnormality or enhancing lesion.    01/16/2018 - 01/22/2019 Chemotherapy    Maintence Herceptin and Perjeta every 3 weeks for 6-12 months starting 01/16/18. Held 03/2018-07/17/18 due to low EF. Completed on 01/22/19     02/04/2018 PET scan    IMPRESSION: 1. The previous consolidation in the right middle lobe currently has a more nodular and reticular appearance with some associated mild atelectasis along its anterior margin. This demonstrates low-grade metabolic activity with maximum SUV 3.2 (background blood pool activity is 2.7). Although conceivably related to low-grade residuum from the patient's malignancy, the presence of calcified right hilar lymph nodes and small calcifications along the nodularity raise suspicion for a low-grade active granulomatous process or atypical infectious process. Surveillance is recommended. 2. Scattered deposits of hypermetabolic but benign metabolically active brown fat.    02/23/2018 -  04/09/2018 Radiation Therapy    Radiation treatment dates:   02/23/2018 - 04/09/2018  Site/dose:   The patient initially received a dose of 50.4 Gy in 28 fractions to the right chest wall and supraclavicular region. This was delivered using a 3-D conformal, 4 field technique. The patient then received a boost to the mastectomy scar. This delivered an additional 10 Gy in 5 fractions using an en face electron field. The total dose was 60.4 Gy.  Narrative: The patient tolerated radiation treatment relatively well.   The patient had some expected skin irritation as she progressed during treatment. Moist desquamation was not present at the end of treatment.    03/20/2018 -  Anti-estrogen oral therapy    -Monthly Zoladex injection starting 03/20/18  -Letrozole 2.5mg once dialy starting 05/03/18  -Switched to Anastrozole starting 07/2018 Due to her worsening back pain.    04/06/2018 Imaging    CT Chest wo contrast IMPRESSION: 1. Stable linear nodular thickening in the RIGHT middle lobe is favored post infectious or inflammatory scarring. 2. Calcified RIGHT hilar lymph node is consistent post infectious process. 3. Stable small LEFT upper lobe nodule. 4. Stable sclerotic lesion in the manubrium. Sclerotic lesion at T3 is more prominent (4 mm). Sclerotic lesion at L 1 is new from 07/02/2017.    04/06/2018 Imaging    04/06/2018 CT Chest IMPRESSION: 1. Stable linear nodular thickening in the RIGHT middle lobe is favored post infectious or inflammatory   scarring. 2. Calcified RIGHT hilar lymph node is consistent post infectious process. 3. Stable small LEFT upper lobe nodule. 4. Stable sclerotic lesion in the manubrium. Sclerotic lesion at T3 is more prominent (4 mm). Sclerotic lesion at L 1 is new from 07/02/2017.    04/10/2018 Echocardiogram    LV EF 45%-50%    05/25/2018 Echocardiogram    LV EF 45%-50%    07/13/2018 Echocardiogram    Normal LV size with EF 55-60%    07/31/2018 Imaging     07/31/2018 Bone Scan IMPRESSION: No evidence of skeletal metastatic disease.    07/31/2018 Imaging    07/31/2018 CT CAP IMPRESSION: Residual linear and nodular peribronchovascular opacities in the right middle lobe, with other scattered areas of ground-glass opacities in the right middle, right upper and right lower lobes, which are located within the previously demonstrated bulky airspace consolidation. The findings are improved from the chest CT dated 12/30/2017, and relatively stable from PET-CT dated 02/04/2018. These may represent post infectious/post inflammatory changes or residual malignancy.  Stable partially calcified right hilar lymphadenopathy.  Interval development of nodular soft tissue thickening within the anterior mediastinum in the area of the thymus, new from chest CT dated 12/30/2017. This may represent reactive thymic tissue, thymoma or lymphadenopathy.  1.8 cm probably physiologic left ovarian cyst. Borderline thickening of the endometrium measuring 15.6 mm. Further evaluation with pelvic ultrasound may be considered.  No new or suspicious findings within the abdomen.    09/15/2018 Echocardiogram    EF 55-60%    01/28/2019 Imaging    MRI Lumbar spie 01/28/19  IMPRESSION: 1. Subcentimeter lesions in the L1 and L2 vertebral bodies, indeterminate for metastases. 2. Mild lower lumbar disc degeneration without stenosis.    01/28/2019 Imaging    CT chest 01/28/19  IMPRESSION: Right lung post radiation changes. No active lung disease or other acute findings.  No evidence of recurrent or metastatic carcinoma within the thorax.     CURRENT THERAPY:   -Monthly Zoladex injections starting 03/20/18  -Letrozole 2.5mg once daily starting 05/03/18, changed to anastrozole in 07/2018 -Neratinib starting in starting 03/15/19   INTERVAL HISTORY: Ms. Games returns for follow up as scheduled. She is in week 2 neratinib on 200 mg daily. The week has gone well, no  significant change since increasing dose. She has more fatigue on neratinib, but still functional. Eats and drinks normally. Denies mucositis. She has loose stool, occasionally watery, and stool frequency. During week 1 she had 7-8 BMs in one day but was only taking lomotil once per day. She has increased to lomotil twice per day and imodium once per day. Has 1-3 lose to watery stools per day on this regimen. No blood in stool. Before starting neratinib she would have 1-2 lose BM daily since chemo. She has intermittent nausea that resolves with compazine. Denies vomiting, constipation, abdominal pain, or GI bleeding. She urinates frequently due to high liquid intake. Denies vaginal bleeding or dryness. She has an allergy cough. Denies fever, dyspnea. She had chest tightness once that could have been anxiety-related. Hot flashes occur daily and worse at night, but tolerable. Her joint pain in knees has improved slightly with knee brace.   REVIEW OF SYSTEMS:   Constitutional: Denies fevers, chills or abnormal weight loss (+) fatigue  Ears, nose, mouth, throat, and face: Denies mucositis or sore throat  Respiratory: Denies dyspnea or wheezes (+) cough, allergies  Cardiovascular: Denies palpitation, chest discomfort or lower extremity swelling (+) chest tightness x1, possible anxiety related    Gastrointestinal:  Denies vomiting, constipation, GI bleeding, heartburn (+) diarrhea, 1-3 xper day  GU: Denies vaginal bleeding or dryness  Skin: Denies abnormal skin rashes Lymphatics: Denies new lymphadenopathy or easy bruising Neurological:Denies numbness, tingling or new weaknesses (+) hot flashes, worse at night  Behavioral/Psych: Mood is stable, no new changes  All other systems were reviewed with the patient and are negative  MEDICAL HISTORY:  Past Medical History:  Diagnosis Date  . Anxiety   . Cancer (HCC)    right breast   . Cardiomyopathy (HCC)   . Family history of breast cancer   . Family  history of colon cancer   . GERD (gastroesophageal reflux disease)   . Headache    couple of migraines in the past  . History of kidney stones   . Vitamin D deficiency     SURGICAL HISTORY: Past Surgical History:  Procedure Laterality Date  . egg donation    . kidney stone removal    . MASTECTOMY W/ SENTINEL NODE BIOPSY Bilateral 07/22/2017   Procedure: RIGHT TOTAL MASTECTOMY WITH RIGHT AXILLARY SENTINEL LYMPH NODE BIOPSY, LEFT PROPHYLACTIC MASTECTOMY;  Surgeon: Wakefield, Matthew, MD;  Location: [facility] OR;  Service: General;  Laterality: Bilateral;  . PORTACATH PLACEMENT Right 07/22/2017   Procedure: INSERTION PORT-A-CATH WITH US;  Surgeon: Wakefield, Matthew, MD;  Location: [facility] OR;  Service: General;  Laterality: Right;    I have reviewed the social history and family history with the patient and they are unchanged from previous note.  ALLERGIES:  has No Known Allergies.  MEDICATIONS:  Current Outpatient Medications  Medication Sig Dispense Refill  . anastrozole (ARIMIDEX) 1 MG tablet Take 1 tablet (1 mg total) by mouth daily. 30 tablet 11  . B Complex-C (B-COMPLEX WITH VITAMIN C) tablet Take 1 tablet by mouth daily.    . Biotin 1000 MCG tablet Take 1,000 mcg by mouth 3 (three) times daily.    . Budesonide ER 9 MG TB24 Take 9 mg by mouth daily. Along with neratinib for the first 1-2 months 30 tablet 2  . carvedilol (COREG) 3.125 MG tablet TAKE 1 TABLET BY MOUTH 2 TIMES DAILY. 60 tablet 5  . Cholecalciferol (VITAMIN D) 2000 units tablet Take 2,000 Units by mouth daily.    . cyclobenzaprine (FLEXERIL) 5 MG tablet Take 1 tablet (5 mg total) by mouth 2 (two) times daily as needed for muscle spasms. 30 tablet 0  . diphenoxylate-atropine (LOMOTIL) 2.5-0.025 MG tablet Take 1-2 tablets by mouth 4 (four) times daily as needed for diarrhea or loose stools. 30 tablet 3  . levothyroxine (SYNTHROID, LEVOTHROID) 25 MCG tablet Take 25 mcg by mouth daily before breakfast.    . loperamide (IMODIUM) 2 MG  capsule Take 1-2 capsules (2-4 mg total) by mouth 3 (three) times daily as needed for diarrhea or loose stools. 120 capsule 0  . Melatonin 10 MG TABS Take 0.5 tablets by mouth.     . naproxen sodium (ANAPROX) 220 MG tablet Take 220 mg 2 (two) times daily as needed by mouth (pain).     . Neratinib Maleate (NERLYNX) 40 MG tablet Week1: Take 4 tab (160mg) by mouth once daily. Week2: Take 5 tab (200mg) once daily. Week3 & on: Take 6 tas (240mg) once daily. Take w food 160 tablet 0  . Omega-3 Fatty Acids (OMEGA-3 FISH OIL PO) Take 100 Units by mouth.    . prochlorperazine (COMPAZINE) 10 MG tablet Take 1 tablet (10 mg total) by mouth every 6 (six) hours as   needed (Nausea or vomiting). 45 tablet 3  . venlafaxine XR (EFFEXOR-XR) 75 MG 24 hr capsule Take 3 capsules (225 mg total) by mouth daily with breakfast. 90 capsule 5  . losartan (COZAAR) 25 MG tablet Take 0.5 tablets (12.5 mg total) by mouth at bedtime. 15 tablet 5   Current Facility-Administered Medications  Medication Dose Route Frequency Provider Last Rate Last Dose  . gi cocktail (Maalox,Lidocaine,Donnatal)  30 mL Oral Once Harle Stanford., PA-C        PHYSICAL EXAMINATION: ECOG PERFORMANCE STATUS: 1 - Symptomatic but completely ambulatory  Vitals:   03/25/19 1522  BP: 96/70  Pulse: 98  Resp: 20  Temp: 98.5 F (36.9 C)  SpO2: 100%   Filed Weights   03/25/19 1522  Weight: 167 lb 9.6 oz (76 kg)    GENERAL:alert, no distress and comfortable SKIN: no rashes or significant lesions EYES: sclera clear OROPHARYNX:no thrush or ulcers  LYMPH:  no palpable cervical or axillary lymphadenopathy LUNGS: clear to auscultation with normal breathing effort HEART: regular rate & rhythm, no lower extremity edema ABDOMEN:abdomen soft, non-tender and normal bowel sounds NEURO: nonfocal Breast: s/p bilateral mastectomy. Surgical scars are well healed. No palpable nodularity along the incisions or chest wall   LABORATORY DATA:  I have reviewed the  data as listed CBC Latest Ref Rng & Units 03/25/2019 02/26/2019 01/22/2019  WBC 4.0 - 10.5 K/uL 4.7 3.3(L) 3.1(L)  Hemoglobin 12.0 - 15.0 g/dL 11.0(L) 12.2 11.6(L)  Hematocrit 36.0 - 46.0 % 33.4(L) 36.8 34.2(L)  Platelets 150 - 400 K/uL 178 179 164     CMP Latest Ref Rng & Units 03/25/2019 02/26/2019 01/22/2019  Glucose 70 - 99 mg/dL 106(H) 125(H) 118(H)  BUN 6 - 20 mg/dL _0 Creatinine 0.44 - 1.00 mg/dL 1.10(H) 1.18(H) 1.03(H)  Sodium 135 - 145 mmol/L 141 140 137  Potassium 3.5 - 5.1 mmol/L 3.5 4.1 4.0  Chloride 98 - 111 mmol/L 107 108 107  CO2 22 - 32 mmol/L _1 Calcium 8.9 - 10.3 mg/dL 8.5(L) 8.8(L) 8.7(L)  Total Protein 6.5 - 8.1 g/dL 7.0 7.4 7.3  Total Bilirubin 0.3 - 1.2 mg/dL 0.3 0.4 0.4  Alkaline Phos 38 - 126 U/L 100 117 101  AST 15 - 41 U/L 9(L) 15 21  ALT 0 - 44 U/L _2 RADIOGRAPHIC STUDIES: I have personally reviewed the radiological images as listed and agreed with the findings in the report. No results found.   ASSESSMENT & PLAN: Teiana Hajduk Lichtis a 43 y.o.femalewith   1. Malignant neoplasm of upper-outer quadrant of right breast in female, mixed invasive ductal and lobular carcinoma, pT3(m)N2aM0, stage 1b, ductal carcinoma triple positive, lobular carcinoma ER+/PR+/HER2-, Grade 2 -She was diagnosed in 05/2017. She is s/pb/lmastectomy, adjuvant TCHP and radiationandmaintenanceHerceptin/Perjeta.  -Her genetic testing was negative. -She is currentlyon adjuvant anastrozole with monthlyZoladexinjections, tolerating well.Began AI and zoladex in 04/2018  -She began Neratinib on 03/15/19. She tolerated first week with 160 mg moderately well, with mild nausea and moderate lose stool up to 8 times one day. She had 1-2 lose BM per day at baseline. She increased lomotil and began imodium and stool frequency improved to around 3 per day. -She titrated dose to 200 mg starting on 03/22/19 for week 2.  -today she is stable, she is tolerating dose  escalation well overall, with mild nausea and diarrhea that is well controlled with medication. She still has room to increase anti-diarrheals and  I strongly encouraged her to do so if needed. She can take 1-2 lomotil 4 times per day and alternate with imodium (up to 8 tabs per day). She understands. I urged her to remain adequately hydrated.  -labs reviewed, CBC and CMP unremarkable. Breast exam is unremarkable. No clinical concern for recurrence.  -Continue 200 mg neratinib per day for week 2, plan to have phone visit on 03/29/19 and increase to full dose if no significant side effects  -Continue with Zoladex monthly; she is not interested in BSO at this time  -Continue Anastrozole daily  -return for lab, f/u with Dr. Feng, and zoladex in 1 month   2. Insomnia  -She has history of insomnia failed multiple agents such as lunesta, trazodone, tylenol pm, and xanax and only had mild relief with Remeron. -takes melatonin 0.5 tab at night  -stable  3. Hot flash.  -Moderate, worse at night, but tolerable.  -ContinueEffexor225mg daily   4. Bone Health -on Calcium and vitamin D -plan to get DEXA done in 2020   5. DiffuseBack pain -she has had chronic back pain since undergoing chemotherapy; CT Chest on 04/06/18 showed sclerotic lesions of spine in T3 and L1, buther bone scan in 07/2018 was negative  -Letrozole was changed to anastrozole in September 2019due to arthralgia -MRI lumbar from 01/28/19 shows lesions of L1 and L2indeterminate, will continue follow up -She will continueFlexeril and aleve for pain PRN.  -now she primarily reports joint pain in her knees, especially when she goes up/down stairs; possibly related to AI -improved with knee brace; monitoring    6. Lung lesions  -seen on CT 12/2017 felt to be related to infection; nearly resolved on repeat CT 01/2019   PLAN: -Lab reviewed  -Continue neratinib 200 mg for week 2 -Phone visit 03/29/19 with me -Titrate neratinib  to full dose 240 mg daily on week 3 if no significant changes -Continue and increase lomotil/imodium PRN for diarrhea  -Lab, f/u with Dr. Feng, and zoladex in 1 month   All questions were answered. The patient knows to call the clinic with any problems, questions or concerns. No barriers to learning was detected.     Lacie K Burton, NP 03/25/19    

## 2019-03-25 ENCOUNTER — Encounter: Payer: Self-pay | Admitting: Nurse Practitioner

## 2019-03-25 ENCOUNTER — Other Ambulatory Visit: Payer: Self-pay

## 2019-03-25 ENCOUNTER — Inpatient Hospital Stay: Payer: BLUE CROSS/BLUE SHIELD

## 2019-03-25 ENCOUNTER — Inpatient Hospital Stay (HOSPITAL_BASED_OUTPATIENT_CLINIC_OR_DEPARTMENT_OTHER): Payer: BLUE CROSS/BLUE SHIELD | Admitting: Nurse Practitioner

## 2019-03-25 VITALS — BP 96/70 | HR 98 | Temp 98.5°F | Resp 20 | Ht 66.0 in | Wt 167.6 lb

## 2019-03-25 DIAGNOSIS — C50411 Malignant neoplasm of upper-outer quadrant of right female breast: Secondary | ICD-10-CM

## 2019-03-25 DIAGNOSIS — M25561 Pain in right knee: Secondary | ICD-10-CM | POA: Diagnosis not present

## 2019-03-25 DIAGNOSIS — R110 Nausea: Secondary | ICD-10-CM

## 2019-03-25 DIAGNOSIS — Z923 Personal history of irradiation: Secondary | ICD-10-CM | POA: Diagnosis not present

## 2019-03-25 DIAGNOSIS — Z9221 Personal history of antineoplastic chemotherapy: Secondary | ICD-10-CM | POA: Diagnosis not present

## 2019-03-25 DIAGNOSIS — M549 Dorsalgia, unspecified: Secondary | ICD-10-CM | POA: Diagnosis not present

## 2019-03-25 DIAGNOSIS — R200 Anesthesia of skin: Secondary | ICD-10-CM | POA: Diagnosis not present

## 2019-03-25 DIAGNOSIS — Z7951 Long term (current) use of inhaled steroids: Secondary | ICD-10-CM

## 2019-03-25 DIAGNOSIS — Z170 Estrogen receptor positive status [ER+]: Secondary | ICD-10-CM

## 2019-03-25 DIAGNOSIS — N939 Abnormal uterine and vaginal bleeding, unspecified: Secondary | ICD-10-CM | POA: Diagnosis not present

## 2019-03-25 DIAGNOSIS — R918 Other nonspecific abnormal finding of lung field: Secondary | ICD-10-CM | POA: Diagnosis not present

## 2019-03-25 DIAGNOSIS — M5136 Other intervertebral disc degeneration, lumbar region: Secondary | ICD-10-CM

## 2019-03-25 DIAGNOSIS — Z79811 Long term (current) use of aromatase inhibitors: Secondary | ICD-10-CM

## 2019-03-25 DIAGNOSIS — Z9013 Acquired absence of bilateral breasts and nipples: Secondary | ICD-10-CM | POA: Diagnosis not present

## 2019-03-25 DIAGNOSIS — Z95828 Presence of other vascular implants and grafts: Secondary | ICD-10-CM

## 2019-03-25 DIAGNOSIS — R232 Flushing: Secondary | ICD-10-CM

## 2019-03-25 DIAGNOSIS — R197 Diarrhea, unspecified: Secondary | ICD-10-CM | POA: Diagnosis not present

## 2019-03-25 DIAGNOSIS — Z79899 Other long term (current) drug therapy: Secondary | ICD-10-CM | POA: Diagnosis not present

## 2019-03-25 DIAGNOSIS — R5383 Other fatigue: Secondary | ICD-10-CM | POA: Diagnosis not present

## 2019-03-25 DIAGNOSIS — G4700 Insomnia, unspecified: Secondary | ICD-10-CM

## 2019-03-25 DIAGNOSIS — R682 Dry mouth, unspecified: Secondary | ICD-10-CM | POA: Diagnosis not present

## 2019-03-25 DIAGNOSIS — M25562 Pain in left knee: Secondary | ICD-10-CM | POA: Diagnosis not present

## 2019-03-25 LAB — CBC WITH DIFFERENTIAL/PLATELET
Abs Immature Granulocytes: 0.01 10*3/uL (ref 0.00–0.07)
Basophils Absolute: 0 10*3/uL (ref 0.0–0.1)
Basophils Relative: 1 %
Eosinophils Absolute: 0.1 10*3/uL (ref 0.0–0.5)
Eosinophils Relative: 2 %
HCT: 33.4 % — ABNORMAL LOW (ref 36.0–46.0)
Hemoglobin: 11 g/dL — ABNORMAL LOW (ref 12.0–15.0)
Immature Granulocytes: 0 %
Lymphocytes Relative: 33 %
Lymphs Abs: 1.5 10*3/uL (ref 0.7–4.0)
MCH: 30.6 pg (ref 26.0–34.0)
MCHC: 32.9 g/dL (ref 30.0–36.0)
MCV: 93 fL (ref 80.0–100.0)
Monocytes Absolute: 0.4 10*3/uL (ref 0.1–1.0)
Monocytes Relative: 8 %
Neutro Abs: 2.6 10*3/uL (ref 1.7–7.7)
Neutrophils Relative %: 56 %
Platelets: 178 10*3/uL (ref 150–400)
RBC: 3.59 MIL/uL — ABNORMAL LOW (ref 3.87–5.11)
RDW: 13 % (ref 11.5–15.5)
WBC: 4.7 10*3/uL (ref 4.0–10.5)
nRBC: 0 % (ref 0.0–0.2)

## 2019-03-25 LAB — COMPREHENSIVE METABOLIC PANEL
ALT: 10 U/L (ref 0–44)
AST: 9 U/L — ABNORMAL LOW (ref 15–41)
Albumin: 3.5 g/dL (ref 3.5–5.0)
Alkaline Phosphatase: 100 U/L (ref 38–126)
Anion gap: 6 (ref 5–15)
BUN: 11 mg/dL (ref 6–20)
CO2: 28 mmol/L (ref 22–32)
Calcium: 8.5 mg/dL — ABNORMAL LOW (ref 8.9–10.3)
Chloride: 107 mmol/L (ref 98–111)
Creatinine, Ser: 1.1 mg/dL — ABNORMAL HIGH (ref 0.44–1.00)
GFR calc Af Amer: >60 mL/min (ref >60)
GFR calc non Af Amer: >60 mL/min (ref >60)
Glucose, Bld: 106 mg/dL — ABNORMAL HIGH (ref 70–99)
Potassium: 3.5 mmol/L (ref 3.5–5.1)
Sodium: 141 mmol/L (ref 135–145)
Total Bilirubin: 0.3 mg/dL (ref 0.3–1.2)
Total Protein: 7 g/dL (ref 6.5–8.1)

## 2019-03-25 MED ORDER — GOSERELIN ACETATE 3.6 MG ~~LOC~~ IMPL
DRUG_IMPLANT | SUBCUTANEOUS | Status: AC
  Filled 2019-03-25: qty 3.6

## 2019-03-25 MED ORDER — HEPARIN SOD (PORK) LOCK FLUSH 100 UNIT/ML IV SOLN
500.0000 [IU] | Freq: Once | INTRAVENOUS | Status: AC
  Administered 2019-03-25: 500 [IU]
  Filled 2019-03-25: qty 5

## 2019-03-25 MED ORDER — SODIUM CHLORIDE 0.9% FLUSH
10.0000 mL | Freq: Once | INTRAVENOUS | Status: AC
  Administered 2019-03-25: 10 mL
  Filled 2019-03-25: qty 10

## 2019-03-25 MED ORDER — GOSERELIN ACETATE 3.6 MG ~~LOC~~ IMPL
3.6000 mg | DRUG_IMPLANT | Freq: Once | SUBCUTANEOUS | Status: AC
  Administered 2019-03-25: 3.6 mg via SUBCUTANEOUS

## 2019-03-26 ENCOUNTER — Telehealth: Payer: Self-pay | Admitting: Nurse Practitioner

## 2019-03-26 ENCOUNTER — Other Ambulatory Visit: Payer: BLUE CROSS/BLUE SHIELD

## 2019-03-26 ENCOUNTER — Ambulatory Visit: Payer: BLUE CROSS/BLUE SHIELD

## 2019-03-26 NOTE — Telephone Encounter (Signed)
No los per 5/7. °

## 2019-03-27 ENCOUNTER — Other Ambulatory Visit: Payer: Self-pay | Admitting: Hematology

## 2019-03-27 DIAGNOSIS — C50411 Malignant neoplasm of upper-outer quadrant of right female breast: Secondary | ICD-10-CM

## 2019-03-27 DIAGNOSIS — Z170 Estrogen receptor positive status [ER+]: Secondary | ICD-10-CM

## 2019-03-29 ENCOUNTER — Telehealth (HOSPITAL_COMMUNITY): Payer: Self-pay | Admitting: Cardiology

## 2019-03-29 ENCOUNTER — Inpatient Hospital Stay (HOSPITAL_BASED_OUTPATIENT_CLINIC_OR_DEPARTMENT_OTHER): Payer: BLUE CROSS/BLUE SHIELD | Admitting: Nurse Practitioner

## 2019-03-29 ENCOUNTER — Encounter: Payer: Self-pay | Admitting: Nurse Practitioner

## 2019-03-29 DIAGNOSIS — Z170 Estrogen receptor positive status [ER+]: Secondary | ICD-10-CM

## 2019-03-29 DIAGNOSIS — Z9221 Personal history of antineoplastic chemotherapy: Secondary | ICD-10-CM

## 2019-03-29 DIAGNOSIS — Z79899 Other long term (current) drug therapy: Secondary | ICD-10-CM

## 2019-03-29 DIAGNOSIS — C50411 Malignant neoplasm of upper-outer quadrant of right female breast: Secondary | ICD-10-CM

## 2019-03-29 DIAGNOSIS — Z79811 Long term (current) use of aromatase inhibitors: Secondary | ICD-10-CM

## 2019-03-29 DIAGNOSIS — Z923 Personal history of irradiation: Secondary | ICD-10-CM

## 2019-03-29 NOTE — Progress Notes (Signed)
[facility]   [phone] Fax:(336) [phone]   Clinic Follow up Note   Patient Care Team: [name], MD as PCP - General (Family Medicine) [name] (Family Medicine) [name], MD as Consulting Physician (General Surgery) [name], MD as Consulting Physician (Hematology) Kyung Rudd, MD as Consulting Physician (Radiation Oncology) 03/29/2019  I connected with Suan Halter on 41/96/22 at  1:15 PM EDT by telephone visit and verified that I am speaking with the correct person using two identifiers.   I discussed the limitations, risks, security and privacy concerns of performing an evaluation and management service by telemedicine and the availability of in-person appointments. I also discussed with the patient that there may be a patient responsible charge related to this service. The patient expressed understanding and agreed to proceed.   There were no other persons participating in the visit.  Patients location: home Providers location: [facility] office   Chief Complaint: breast cancer; f/u neratinib toxicity check   SUMMARY OF ONCOLOGIC HISTORY: Oncology History    Cancer Staging Malignant neoplasm of upper-outer quadrant of right breast in female, estrogen receptor positive ([place]) Staging form: Breast, AJCC 8th Edition - Clinical stage from 06/11/2017: Stage IB (cT3, cN0, cM0, G2, ER: Positive, PR: Positive, HER2: Positive) - Signed by [name], MD on 06/16/2017 - Pathologic stage from 07/22/2017: Stage IB (pT3(m), pN2a(sn), cM0, G2, ER: Positive, PR: Positive, HER2: Positive) - Signed by [name], NP on 08/04/2017      Malignant neoplasm of upper-outer quadrant of right breast in female, estrogen receptor positive ([place])   06/11/2017 Initial Biopsy    Diagnosis 06/11/17 Breast, right, needle core biopsy, 11:00 o'clock - INVASIVE DUCTAL CARCINOMA, G2    06/11/2017 Mammogram    [name] and [facility] Diagnostic Breast Tomo Bilateral  06/11/17 IMPRESSION: 1. Highly suspicious mass within the retroareolar right breast, extending from the 8:00 to 11:00 axes, anterior to posterior depth, extending anteriorly to the nipple with associated nipple retraction, measuring at least 5.5 cm by ultrasound  2. No sonographic evidence of metastatic lymphadenopathy in the right axilla. 3. No evidence of malignancy within the left breast.     06/11/2017 Initial Diagnosis    Malignant neoplasm of upper-outer quadrant of right breast in female, estrogen receptor positive ([place])    06/11/2017 Receptors her2    Estrogen Receptor: 100%, POSITIVE, STRONG STAINING INTENSITY Progesterone Receptor: 100%, POSITIVE, STRONG STAINING INTENSITY Proliferation Marker Ki67: 12%Proliferation Marker Ki67: 12%  HER2 - **POSITIVE** RATIO OF HER2/CEP17 SIGNALS 2.55 AVERAGE HER2 COPY NUMBER PER CELL 4.85    06/25/2017 Genetic Testing    Negative genetic testing on the 9 gene STAT panel.  The STAT Breast cancer panel offered by [facility] includes sequencing and rearrangement analysis for the following 9 genes:  ATM, BRCA1, BRCA2, CDH1, CHEK2, PALB2, PTEN, STK11 and TP53.   The report date is June 25, 2017.  Negative genetic testing on the common hereditary cancer panel.  The Hereditary Gene Panel offered by [facility] includes sequencing and/or deletion duplication testing of the following 46 genes: APC, ATM, AXIN2, BARD1, BMPR1A, BRCA1, BRCA2, BRIP1, CDH1, CDKN2A (p14ARF), CDKN2A (p16INK4a), CHEK2, CTNNA1, DICER1, EPCAM (Deletion/duplication testing only), GREM1 (promoter region deletion/duplication testing only), KIT, MEN1, MLH1, MSH2, MSH3, MSH6, MUTYH, NBN, NF1, NHTL1, PALB2, PDGFRA, PMS2, POLD1, POLE, PTEN, RAD50, RAD51C, RAD51D, SDHB, SDHC, SDHD, SMAD4, SMARCA4. STK11, TP53, TSC1, TSC2, and VHL.  The following genes were evaluated for sequence changes only: SDHA and HOXB13 c.251G>A variant only.  The  report date is June 25, 2017.     07/02/2017 Imaging    CT cAP  07/02/17 IMPRESSION: 1. Subareolar right breast mass. No compelling findings of nodal or metastatic involvement. 2. Old granulomatous disease. 3. There is a 7 mm enhancing or hyperdense focus posteriorly in segment 7 of the liver in the subcapsular region. By virtue of its small size this lesion is technically nonspecific although statistically likely to be a small benign lesions such as flash filling hemangioma. This may warrant surveillance. 4.  Prominent stool throughout the colon favors constipation. 5. Degenerative disc disease and spondylosis at L5-S1 likely causing mild impingement.    07/02/2017 Imaging    Bone scan 07/02/17 IMPRESSION: Today' s exam is negative. I ascribed the tiny focus of activity just proximal to the left antecubital region to injection site.    07/22/2017 Surgery    RIGHT TOTAL MASTECTOMY WITH RIGHT AXILLARY SENTINEL LYMPH NODE BIOPSY, LEFT PROPHYLACTIC MASTECTOM and INSERTION PORT-A-CATH WITH [name] by [name] and [facility]     07/22/2017 Pathology Results    Diagnosis 1. Breast, simple mastectomy, Left - FIBROADENOMA. - NO MALIGNANCY IDENTIFIED. 2. 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 - ADDITIONAL FOCUS OF LOBULAR CARCINOMA, GRADE 2, SPANNING 2.2 CM. - INTERMEDIATE GRADE DUCTAL CARCINOMA IN SITU. - INVASIVE CARCINOMA COMES TO WITHIN 0.2 TO 0.3 CM OF THE DEEP MARGIN, FOCALLY. - TUMOR FOCALLY INVOLVES EPIDERMIS. - LYMPHOVASCULAR INVASION OF DERMAL LYMPHATICS. - ONE OF ONE LYMPH NODES NEGATIVE FOR CARCINOMA (0/1). - SEE ONCOLOGY TABLE. 3. Lymph node, sentinel, biopsy, Right axillary - ONE LYMPH NODE WITH ISOLATED TUMOR CELLS (0/1). 4. Lymph node, sentinel, biopsy, Right - METASTATIC CARCINOMA IN ONE OF ONE LYMPH NODES (1/1). 5. Lymph node, sentinel, biopsy, Right - ONE OF ONE LYMPH NODES NEGATIVE FOR CARCINOMA (0/1). 6. Lymph node, sentinel, biopsy, Right - METASTATIC CARCINOMA IN  ONE OF ONE LYMPH NODES (1/1). - EXTRACAPSULAR EXTENSION. 7. Lymph node, sentinel, biopsy, Right - METASTATIC CARCINOMA IN ONE OF ONE LYMPH NODES (1/1). - EXTRACAPSULAR EXTENSION. 8. Lymph node, sentinel, biopsy, Right - ONE OF ONE LYMPH NODES NEGATIVE FOR CARCINOMA (0/1). 9. Lymph node, sentinel, biopsy, Right - ONE OF ONE LYMPH NODES NEGATIVE FOR CARCINOMA (0/1). 10. Lymph node, sentinel, biopsy, Right - METASTATIC CARCINOMA IN ONE OF ONE LYMPH NODES (1/1). - EXTRACAPSULAR EXTENSION. 11. Lymph node, biopsy, Right axillary - ONE OF ONE LYMPH NODES NEGATIVE FOR CARCINOMA (0/1).     07/22/2017 Receptors her2    Ductal carcinoma in primary breast tumor and node metastasis are ER 95%, PR 95% strongly positive, and HER2 + The lobular component in primary breast tumor is ER 95% positive, PR 95% positive, strong staining, HER-2 negative.   Original biopsy [id number]: Ductal morphology, Her2 FISH positive. Current specimen: Main focus is 75% ductal morphology, 25% lobular morphology. Smaller focus is lobular morphology. #2A Lobular morphology, Her IHC negative. #2E Lobular morphology, Her2 FISH negative, Her2 IHC negative. #4 50% lobular and 50% ductal morphology, Her2 FISH positive, Her2 IHC negative. #6 Ductal morphology #7 Ductal morphology #10 50% lobular, 50% ductal morphology    08/25/2017 Imaging    MRI abdomen done at [facility]:  Enhancing structure measing 5 mm is compatible with a flash filling hemangioma. No specific findings to suggest metastatic cancer    08/25/2017 Imaging    MRI Abdomen at [facility] 08/25/17  IMPRESSION: 1. Lesion in segment 7 of the liver has signal and enhancement characteristics compatible with  a flash filling hemangioma. No specific findings identified to suggest metastatic disease.    08/29/2017 - 12/12/2017 Chemotherapy    adjuvant TCHP every 3 weeks for 6 cycles starting 08/29/17. Postponed and decreased Carbo to '650mg'$  and added Onpro with cycle 2 on  09/19/17 due to neutropenic fever. Carbplatin further reduced to '500mg'$  starting with cycle 3 due to thrombocytopenia. Herceptin was held for Cycle 4 and 5 due to worsening global longitudinal strain found on ECHO from 11/07/17. Completed 6 cycles on 12/12/17       09/04/2017 - 09/09/2017 Hospital Admission    Admit date: 09/04/2017 Discharge date: 09/09/2017  DISCHARGE DIAGNOSES:  Principal Problem:   Sepsis [facility]) Active Problems:   Malignant neoplasm of upper-outer quadrant of right breast in female, estrogen receptor positive ([place])   Port-A-Cath in place   Neutropenic fever ([place])   Hyponatremia   Malignant neoplasm of breast in female, estrogen receptor positive ([place])     12/28/2017 Imaging    CT CHEST IMPRESSION: 1. 2.3 x 2.7 x 1.2 cm irregular masslike opacity within the right middle lobe worrisome for malignancy/metastasis. 2. 3 new very small sclerotic lesions, within the sternum and the body of T4, worrisome for new metastases.    12/28/2017 - 01/05/2018 Hospital Admission    Admit date: 12/28/17-01/05/18 Admission diagnosis: multifocal pneumonia, influenza A, sepsis, hypoxia Additional comments: imaging during hospitalization notable for new irregular masslike opacity in the right middle lobe worrisome for metastasis and new very small sclerotic lesion within the sternum and the body of T4, worrisome for metastasis    12/29/2017 Imaging    CT ABD/PELVIS IMPRESSION: No definite acute intra-abdominal or intrapelvic abnormalities.  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    12/30/2017 Imaging    CT HEAD IMPRESSION: No acute intracranial abnormality or enhancing lesion.    01/16/2018 - 01/22/2019 Chemotherapy    Maintence Herceptin and Perjeta every 3 weeks for 6-12 months starting 01/16/18. Held 03/2018-07/17/18 due to low EF. Completed on 01/22/19     02/04/2018 PET scan    IMPRESSION: 1. The previous consolidation in the right middle lobe  currently has a more nodular and reticular appearance with some associated mild atelectasis along its anterior margin. This demonstrates low-grade metabolic activity with maximum SUV 3.2 (background blood pool activity is 2.7). Although conceivably related to low-grade residuum from the patient's malignancy, the presence of calcified right hilar lymph nodes and small calcifications along the nodularity raise suspicion for a low-grade active granulomatous process or atypical infectious process. Surveillance is recommended. 2. Scattered deposits of hypermetabolic but benign metabolically active brown fat.    02/23/2018 - 04/09/2018 Radiation Therapy    Radiation treatment dates:   02/23/2018 - 04/09/2018  Site/dose:   The patient initially received a dose of 50.4 Gy in 28 fractions to the right chest wall and supraclavicular region. This was delivered using a 3-D conformal, 4 field technique. The patient then received a boost to the mastectomy scar. This delivered an additional 10 Gy in 5 fractions using an en face electron field. The total dose was 60.4 Gy.  Narrative: The patient tolerated radiation treatment relatively well.   The patient had some expected skin irritation as she progressed during treatment. Moist desquamation was not present at the end of treatment.    03/20/2018 -  Anti-estrogen oral therapy    -Monthly Zoladex injection starting 03/20/18  -Letrozole 2.'5mg'$  once dialy starting 05/03/18  -Switched to Anastrozole starting 07/2018 Due to her  worsening back pain.    04/06/2018 Imaging    CT Chest wo contrast IMPRESSION: 1. Stable linear nodular thickening in the RIGHT middle lobe is favored post infectious or inflammatory scarring. 2. Calcified RIGHT hilar lymph node is consistent post infectious process. 3. Stable small LEFT upper lobe nodule. 4. Stable sclerotic lesion in the manubrium. Sclerotic lesion at T3 is more prominent (4 mm). Sclerotic lesion at L 1 is new  from 07/02/2017.    04/06/2018 Imaging    04/06/2018 CT Chest IMPRESSION: 1. Stable linear nodular thickening in the RIGHT middle lobe is favored post infectious or inflammatory scarring. 2. Calcified RIGHT hilar lymph node is consistent post infectious process. 3. Stable small LEFT upper lobe nodule. 4. Stable sclerotic lesion in the manubrium. Sclerotic lesion at T3 is more prominent (4 mm). Sclerotic lesion at L 1 is new from 07/02/2017.    04/10/2018 Echocardiogram    LV EF 45%-50%    05/25/2018 Echocardiogram    LV EF 45%-50%    07/13/2018 Echocardiogram    Normal LV size with EF 55-60%    07/31/2018 Imaging    07/31/2018 Bone Scan IMPRESSION: No evidence of skeletal metastatic disease.    07/31/2018 Imaging    07/31/2018 CT CAP IMPRESSION: Residual linear and nodular peribronchovascular opacities in the right middle lobe, with other scattered areas of ground-glass opacities in the right middle, right upper and right lower lobes, which are located within the previously demonstrated bulky airspace consolidation. The findings are improved from the chest CT dated 12/30/2017, and relatively stable from PET-CT dated 02/04/2018. These may represent post infectious/post inflammatory changes or residual malignancy.  Stable partially calcified right hilar lymphadenopathy.  Interval development of nodular soft tissue thickening within the anterior mediastinum in the area of the thymus, new from chest CT dated 12/30/2017. This may represent reactive thymic tissue, thymoma or lymphadenopathy.  1.8 cm probably physiologic left ovarian cyst. Borderline thickening of the endometrium measuring 15.6 mm. Further evaluation with pelvic ultrasound may be considered.  No new or suspicious findings within the abdomen.    09/15/2018 Echocardiogram    EF 55-60%    01/28/2019 Imaging    MRI Lumbar spie 01/28/19  IMPRESSION: 1. Subcentimeter lesions in the L1 and L2 vertebral  bodies, indeterminate for metastases. 2. Mild lower lumbar disc degeneration without stenosis.    01/28/2019 Imaging    CT chest 01/28/19  IMPRESSION: Right lung post radiation changes. No active lung disease or other acute findings.  No evidence of recurrent or metastatic carcinoma within the thorax.     CURRENT THERAPY: Neratinib 240 mg daily, starting 03/15/2019   INTERVAL HISTORY: Ms. Alia is at home and doing well. She denies changes over the weekend. She feels better. She does have more fatigue, but remains functional.  Yesterday she had 3 lose stools, today only 1. She has not used imodium or lomotil in several days. Nausea improved also, she has not required anti-emetics. Denies constipation or vomiting. She is able to eat and drink well. Denies GI bleeding. Has some blood from her nose and she noticed a bruise the other day. Knee pain is stable, and some back pain in last few days. Hot flashes have "calmed down" during the day, has 1-2 at night. Denies fever, chills, cough, chest pain, dyspnea, swelling  REVIEW OF SYSTEMS:   Constitutional: Denies fevers, chills or abnormal weight loss (+) fatigue  Ears, nose, mouth, throat, and face: Denies mucositis or sore throat (+) mild nose bleeding  Respiratory:  Denies cough, dyspnea or wheezes Cardiovascular: Denies palpitation, chest discomfort or lower extremity swelling Gastrointestinal:  Denies vomiting, constipation, GI bleeding, heartburn or change in bowel habits (+) mild nausea, improved (+) 1-3 lose stool  Skin: Denies abnormal skin rashes Lymphatics: Denies new lymphadenopathy (+) bruise  Neurological:Denies numbness, tingling or new weaknesses Behavioral/Psych: Mood is stable, no new changes  MSK: (+) knee pain (+) back pain  All other systems were reviewed with the patient and are negative.  MEDICAL HISTORY:  Past Medical History:  Diagnosis Date   Anxiety    Cancer ([place])    right breast    Cardiomyopathy ([place])     Family history of breast cancer    Family history of colon cancer    GERD (gastroesophageal reflux disease)    Headache    couple of migraines in the past   History of kidney stones    Vitamin D deficiency     SURGICAL HISTORY: Past Surgical History:  Procedure Laterality Date   egg donation     kidney stone removal     MASTECTOMY W/ SENTINEL NODE BIOPSY Bilateral 07/22/2017   Procedure: RIGHT TOTAL MASTECTOMY WITH RIGHT AXILLARY SENTINEL LYMPH NODE BIOPSY, LEFT PROPHYLACTIC MASTECTOMY;  Surgeon: [name], MD;  Location: [place];  Service: General;  Laterality: Bilateral;   PORTACATH PLACEMENT Right 07/22/2017   Procedure: INSERTION PORT-A-CATH WITH [name];  Surgeon: [name], MD;  Location: [place];  Service: General;  Laterality: Right;    I have reviewed the social history and family history with the patient and they are unchanged from previous note.  ALLERGIES:  has No Known Allergies.  MEDICATIONS:  Current Outpatient Medications  Medication Sig Dispense Refill   anastrozole (ARIMIDEX) 1 MG tablet Take 1 tablet (1 mg total) by mouth daily. 30 tablet 11   B Complex-C (B-COMPLEX WITH VITAMIN C) tablet Take 1 tablet by mouth daily.     Biotin 1000 MCG tablet Take 1,000 mcg by mouth 3 (three) times daily.     Budesonide ER 9 MG TB24 TAKE 1 TABLET (9 MG) BY MOUTH DAILY. ALONG WITH NERATINIB FOR THE FIRST 1-2 MONTHS 30 tablet 2   carvedilol (COREG) 3.125 MG tablet TAKE 1 TABLET BY MOUTH 2 TIMES DAILY. 60 tablet 5   Cholecalciferol (VITAMIN D) 2000 units tablet Take 2,000 Units by mouth daily.     cyclobenzaprine (FLEXERIL) 5 MG tablet Take 1 tablet (5 mg total) by mouth 2 (two) times daily as needed for muscle spasms. 30 tablet 0   diphenoxylate-atropine (LOMOTIL) 2.5-0.025 MG tablet Take 1-2 tablets by mouth 4 (four) times daily as needed for diarrhea or loose stools. 30 tablet 3   levothyroxine (SYNTHROID, LEVOTHROID) 25 MCG tablet Take 25 mcg by mouth  daily before breakfast.     loperamide (IMODIUM) 2 MG capsule Take 1-2 capsules (2-4 mg total) by mouth 3 (three) times daily as needed for diarrhea or loose stools. 120 capsule 0   losartan (COZAAR) 25 MG tablet Take 0.5 tablets (12.5 mg total) by mouth at bedtime. 15 tablet 5   Melatonin 10 MG TABS Take 0.5 tablets by mouth.      naproxen sodium (ANAPROX) 220 MG tablet Take 220 mg 2 (two) times daily as needed by mouth (pain).      Neratinib Maleate (NERLYNX) 40 MG tablet Week1: Take 4 tab ('160mg'$ ) by mouth once daily. EQAS3: Take 5 tab ('200mg'$ ) once daily. MHDQ2 & on: Take 6 tas ('240mg'$ ) once daily. Take w food 160  tablet 0   Omega-3 Fatty Acids (OMEGA-3 FISH OIL PO) Take 100 Units by mouth.     prochlorperazine (COMPAZINE) 10 MG tablet Take 1 tablet (10 mg total) by mouth every 6 (six) hours as needed (Nausea or vomiting). 45 tablet 3   venlafaxine XR (EFFEXOR-XR) 75 MG 24 hr capsule Take 3 capsules (225 mg total) by mouth daily with breakfast. 90 capsule 5   Current Facility-Administered Medications  Medication Dose Route Frequency Provider Last Rate Last Dose   gi cocktail (Maalox,Lidocaine,Donnatal)  30 mL Oral Once Harle Stanford., PA-C       OBJECTIVE:  Patient appears well over the phone. Speech is clear and non-pressured   LABORATORY DATA:  None for this visit    RADIOGRAPHIC STUDIES: None for this visit   ASSESSMENT & PLAN: Jermesha Sottile Lichtis a 43 y.o.femalewith   1. Malignant neoplasm of upper-outer quadrant of right breast in female, mixed invasive ductal and lobular carcinoma, pT3(m)N2aM0, stage 1b, ductal carcinoma triple positive, lobular carcinoma ER+/PR+/HER2-, Grade 2 2. Insomnia  3. Hot flash.  4. Bone Health 5. DiffuseBack pain 6. Lung lesions -seen on CT 12/2017 felt to be related to infection; nearly resolved on repeat CT 01/2019  Ms. Mauceri appears stable. She completed week 2 neratinib at 200 mg daily. Her nausea and diarrhea improved. She has  1-3 lose BM per day. She takes lomotil and imodium sparingly. Her fatigue has increased but remains manageable. Overall she is tolerating moderately well. She will increase to 240 mg daily from week 3, starting today. I encouraged her to increase anti-diarrhea medication as needed, she agrees.   She continues letrozole. Knee and back pain are stable. Hot flashes have improved during the day.   We will continue phone visits for 3 more weeks. She will return for lab, in-person f/u, and zoladex inj on 04/23/19.   PLAN: -increase neratinib to full dose 240 mg daily  -phone visits weekly x3 -return for lab, in person f/u, and injection 04/23/19  The patient was advised to call back or seek an in-person evaluation if she has new or worsening concerns.  I provided 10 minutes of non-face-to-face time during this encounter.   [name], NP 03/29/19

## 2019-03-29 NOTE — Telephone Encounter (Signed)
Patient called to request echo results and med questions Advised per Dr Aundra Dubin Echo done 03/18/19 -ok Should continue coreg and losartan  Nothing further needed at this time

## 2019-03-31 ENCOUNTER — Other Ambulatory Visit: Payer: Self-pay | Admitting: Nurse Practitioner

## 2019-03-31 ENCOUNTER — Other Ambulatory Visit (HOSPITAL_COMMUNITY): Payer: Self-pay | Admitting: Cardiology

## 2019-03-31 DIAGNOSIS — Z170 Estrogen receptor positive status [ER+]: Secondary | ICD-10-CM

## 2019-03-31 DIAGNOSIS — C50411 Malignant neoplasm of upper-outer quadrant of right female breast: Secondary | ICD-10-CM

## 2019-03-31 MED ORDER — NERATINIB MALEATE 40 MG PO TABS: ORAL_TABLET | ORAL | 0 refills | Status: DC

## 2019-04-01 ENCOUNTER — Telehealth: Payer: Self-pay

## 2019-04-01 ENCOUNTER — Other Ambulatory Visit (HOSPITAL_COMMUNITY): Payer: Self-pay | Admitting: Cardiology

## 2019-04-01 NOTE — Telephone Encounter (Signed)
Faxed script for Nerlynx to [facility] at [phone] sent to [facility] for scan

## 2019-04-02 ENCOUNTER — Other Ambulatory Visit: Payer: Self-pay

## 2019-04-05 ENCOUNTER — Inpatient Hospital Stay (HOSPITAL_BASED_OUTPATIENT_CLINIC_OR_DEPARTMENT_OTHER): Payer: BLUE CROSS/BLUE SHIELD | Admitting: Nurse Practitioner

## 2019-04-05 ENCOUNTER — Encounter: Payer: Self-pay | Admitting: Nurse Practitioner

## 2019-04-05 DIAGNOSIS — Z923 Personal history of irradiation: Secondary | ICD-10-CM

## 2019-04-05 DIAGNOSIS — Z9221 Personal history of antineoplastic chemotherapy: Secondary | ICD-10-CM

## 2019-04-05 DIAGNOSIS — Z170 Estrogen receptor positive status [ER+]: Secondary | ICD-10-CM | POA: Diagnosis not present

## 2019-04-05 DIAGNOSIS — Z79811 Long term (current) use of aromatase inhibitors: Secondary | ICD-10-CM

## 2019-04-05 DIAGNOSIS — Z800 Family history of malignant neoplasm of digestive organs: Secondary | ICD-10-CM

## 2019-04-05 DIAGNOSIS — C50411 Malignant neoplasm of upper-outer quadrant of right female breast: Secondary | ICD-10-CM | POA: Diagnosis not present

## 2019-04-05 DIAGNOSIS — Z803 Family history of malignant neoplasm of breast: Secondary | ICD-10-CM

## 2019-04-05 DIAGNOSIS — Z79899 Other long term (current) drug therapy: Secondary | ICD-10-CM

## 2019-04-05 NOTE — Progress Notes (Signed)
[name]   [phone] [phone]) [phone]   Clinic Follow up Note   Patient Care Team: [name], MD as PCP - General (Family Medicine) [name] (Family Medicine) [name], MD as Consulting Physician (General Surgery) [name], MD as Consulting Physician (Hematology) Kyung Rudd, MD as Consulting Physician (Radiation Oncology) 04/05/2019  I connected with Sheila [name] on 27/06/23 at 1:17 PM EDT by telephone and verified that I am speaking with the correct person using two identifiers.   I discussed the limitations, risks, security and privacy concerns of performing an evaluation and management service by telemedicine and the availability of in-person appointments. I also discussed with the patient that there may be a patient responsible charge related to this service. The patient expressed understanding and agreed to proceed.   No other persons participating in the visit   Patient's location: home Provider's location: [facility] office   Chief Complaint: virtual toxicity check   SUMMARY OF ONCOLOGIC HISTORY: Oncology History    Cancer Staging Malignant neoplasm of upper-outer quadrant of right breast in female, estrogen receptor positive ([place]) Staging form: Breast, AJCC 8th Edition - Clinical stage from 06/11/2017: Stage IB (cT3, cN0, cM0, G2, ER: Positive, PR: Positive, HER2: Positive) - Signed by [name], MD on 06/16/2017 - Pathologic stage from 07/22/2017: Stage IB (pT3(m), pN2a(sn), cM0, G2, ER: Positive, PR: Positive, HER2: Positive) - Signed by [name], NP on 08/04/2017      Malignant neoplasm of upper-outer quadrant of right breast in female, estrogen receptor positive ([place])   06/11/2017 Initial Biopsy    Diagnosis 06/11/17 Breast, right, needle core biopsy, 11:00 o'clock - INVASIVE DUCTAL CARCINOMA, G2    06/11/2017 Mammogram    [name] and [facility] Diagnostic Breast Tomo Bilateral 06/11/17 IMPRESSION: 1. Highly  suspicious mass within the retroareolar right breast, extending from the 8:00 to 11:00 axes, anterior to posterior depth, extending anteriorly to the nipple with associated nipple retraction, measuring at least 5.5 cm by ultrasound  2. No sonographic evidence of metastatic lymphadenopathy in the right axilla. 3. No evidence of malignancy within the left breast.     06/11/2017 Initial Diagnosis    Malignant neoplasm of upper-outer quadrant of right breast in female, estrogen receptor positive ([place])    06/11/2017 Receptors her2    Estrogen Receptor: 100%, POSITIVE, STRONG STAINING INTENSITY Progesterone Receptor: 100%, POSITIVE, STRONG STAINING INTENSITY Proliferation Marker Ki67: 12%Proliferation Marker Ki67: 12%  HER2 - **POSITIVE** RATIO OF HER2/CEP17 SIGNALS 2.55 AVERAGE HER2 COPY NUMBER PER CELL 4.85    06/25/2017 Genetic Testing    Negative genetic testing on the 9 gene STAT panel.  The STAT Breast cancer panel offered by [facility] includes sequencing and rearrangement analysis for the following 9 genes:  ATM, BRCA1, BRCA2, CDH1, CHEK2, PALB2, PTEN, STK11 and TP53.   The report date is June 25, 2017.  Negative genetic testing on the common hereditary cancer panel.  The Hereditary Gene Panel offered by [facility] includes sequencing and/or deletion duplication testing of the following 46 genes: APC, ATM, AXIN2, BARD1, BMPR1A, BRCA1, BRCA2, BRIP1, CDH1, CDKN2A (p14ARF), CDKN2A (p16INK4a), CHEK2, CTNNA1, DICER1, EPCAM (Deletion/duplication testing only), GREM1 (promoter region deletion/duplication testing only), KIT, MEN1, MLH1, MSH2, MSH3, MSH6, MUTYH, NBN, NF1, NHTL1, PALB2, PDGFRA, PMS2, POLD1, POLE, PTEN, RAD50, RAD51C, RAD51D, SDHB, SDHC, SDHD, SMAD4, SMARCA4. STK11, TP53, TSC1, TSC2, and VHL.  The following genes were evaluated for sequence changes only: SDHA and HOXB13 c.251G>A variant only.  The report date is June 25, 2017.  07/02/2017 Imaging    CT cAP 07/02/17 IMPRESSION: 1.  Subareolar right breast mass. No compelling findings of nodal or metastatic involvement. 2. Old granulomatous disease. 3. There is a 7 mm enhancing or hyperdense focus posteriorly in segment 7 of the liver in the subcapsular region. By virtue of its small size this lesion is technically nonspecific although statistically likely to be a small benign lesions such as flash filling hemangioma. This may warrant surveillance. 4.  Prominent stool throughout the colon favors constipation. 5. Degenerative disc disease and spondylosis at L5-S1 likely causing mild impingement.    07/02/2017 Imaging    Bone scan 07/02/17 IMPRESSION: Today' s exam is negative. I ascribed the tiny focus of activity just proximal to the left antecubital region to injection site.    07/22/2017 Surgery    RIGHT TOTAL MASTECTOMY WITH RIGHT AXILLARY SENTINEL LYMPH NODE BIOPSY, LEFT PROPHYLACTIC MASTECTOM and INSERTION PORT-A-CATH WITH [name] by [name] and [facility]     07/22/2017 Pathology Results    Diagnosis 1. Breast, simple mastectomy, Left - FIBROADENOMA. - NO MALIGNANCY IDENTIFIED. 2. 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 - ADDITIONAL FOCUS OF LOBULAR CARCINOMA, GRADE 2, SPANNING 2.2 CM. - INTERMEDIATE GRADE DUCTAL CARCINOMA IN SITU. - INVASIVE CARCINOMA COMES TO WITHIN 0.2 TO 0.3 CM OF THE DEEP MARGIN, FOCALLY. - TUMOR FOCALLY INVOLVES EPIDERMIS. - LYMPHOVASCULAR INVASION OF DERMAL LYMPHATICS. - ONE OF ONE LYMPH NODES NEGATIVE FOR CARCINOMA (0/1). - SEE ONCOLOGY TABLE. 3. Lymph node, sentinel, biopsy, Right axillary - ONE LYMPH NODE WITH ISOLATED TUMOR CELLS (0/1). 4. Lymph node, sentinel, biopsy, Right - METASTATIC CARCINOMA IN ONE OF ONE LYMPH NODES (1/1). 5. Lymph node, sentinel, biopsy, Right - ONE OF ONE LYMPH NODES NEGATIVE FOR CARCINOMA (0/1). 6. Lymph node, sentinel, biopsy, Right - METASTATIC CARCINOMA IN ONE OF ONE LYMPH NODES  (1/1). - EXTRACAPSULAR EXTENSION. 7. Lymph node, sentinel, biopsy, Right - METASTATIC CARCINOMA IN ONE OF ONE LYMPH NODES (1/1). - EXTRACAPSULAR EXTENSION. 8. Lymph node, sentinel, biopsy, Right - ONE OF ONE LYMPH NODES NEGATIVE FOR CARCINOMA (0/1). 9. Lymph node, sentinel, biopsy, Right - ONE OF ONE LYMPH NODES NEGATIVE FOR CARCINOMA (0/1). 10. Lymph node, sentinel, biopsy, Right - METASTATIC CARCINOMA IN ONE OF ONE LYMPH NODES (1/1). - EXTRACAPSULAR EXTENSION. 11. Lymph node, biopsy, Right axillary - ONE OF ONE LYMPH NODES NEGATIVE FOR CARCINOMA (0/1).     07/22/2017 Receptors her2    Ductal carcinoma in primary breast tumor and node metastasis are ER 95%, PR 95% strongly positive, and HER2 + The lobular component in primary breast tumor is ER 95% positive, PR 95% positive, strong staining, HER-2 negative.   Original biopsy [id number]: Ductal morphology, Her2 FISH positive. Current specimen: Main focus is 75% ductal morphology, 25% lobular morphology. Smaller focus is lobular morphology. #2A Lobular morphology, Her IHC negative. #2E Lobular morphology, Her2 FISH negative, Her2 IHC negative. #4 50% lobular and 50% ductal morphology, Her2 FISH positive, Her2 IHC negative. #6 Ductal morphology #7 Ductal morphology #10 50% lobular, 50% ductal morphology    08/25/2017 Imaging    MRI abdomen done at [facility]:  Enhancing structure measing 5 mm is compatible with a flash filling hemangioma. No specific findings to suggest metastatic cancer    08/25/2017 Imaging    MRI Abdomen at [facility] 08/25/17  IMPRESSION: 1. Lesion in segment 7 of the liver has signal and enhancement characteristics compatible with a flash filling hemangioma. No specific findings identified to suggest  metastatic disease.    08/29/2017 - 12/12/2017 Chemotherapy    adjuvant TCHP every 3 weeks for 6 cycles starting 08/29/17. Postponed and decreased Carbo to 686m and added Onpro with cycle 2 on 09/19/17 due to neutropenic  fever. Carbplatin further reduced to 5028mstarting with cycle 3 due to thrombocytopenia. Herceptin was held for Cycle 4 and 5 due to worsening global longitudinal strain found on ECHO from 11/07/17. Completed 6 cycles on 12/12/17       09/04/2017 - 09/09/2017 Hospital Admission    Admit date: 09/04/2017 Discharge date: 09/09/2017  DISCHARGE DIAGNOSES:  Principal Problem:   [facility]:   Malignant neoplasm of upper-outer quadrant of right breast in female, estrogen receptor positive ([place]  Port-A-Cath in place   Neutropenic fever ([place]  Hyponatremia   Malignant neoplasm of breast in female, estrogen receptor positive ([place]    12/28/2017 Imaging    CT CHEST IMPRESSION: 1. 2.3 x 2.7 x 1.2 cm irregular masslike opacity within the right middle lobe worrisome for malignancy/metastasis. 2. 3 new very small sclerotic lesions, within the sternum and the body of T4, worrisome for new metastases.    12/28/2017 - 01/05/2018 Hospital Admission    Admit date: 12/28/17-01/05/18 Admission diagnosis: multifocal pneumonia, influenza A, sepsis, hypoxia Additional comments: imaging during hospitalization notable for new irregular masslike opacity in the right middle lobe worrisome for metastasis and new very small sclerotic lesion within the sternum and the body of T4, worrisome for metastasis    12/29/2017 Imaging    CT ABD/PELVIS IMPRESSION: No definite acute intra-abdominal or intrapelvic abnormalities.  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    12/30/2017 Imaging    CT HEAD IMPRESSION: No acute intracranial abnormality or enhancing lesion.    01/16/2018 - 01/22/2019 Chemotherapy    Maintence Herceptin and Perjeta every 3 weeks for 6-12 months starting 01/16/18. Held 03/2018-07/17/18 due to low EF. Completed on 01/22/19     02/04/2018 PET scan    IMPRESSION: 1. The previous consolidation in the right middle lobe currently has a more nodular  and reticular appearance with some associated mild atelectasis along its anterior margin. This demonstrates low-grade metabolic activity with maximum SUV 3.2 (background blood pool activity is 2.7). Although conceivably related to low-grade residuum from the patient's malignancy, the presence of calcified right hilar lymph nodes and small calcifications along the nodularity raise suspicion for a low-grade active granulomatous process or atypical infectious process. Surveillance is recommended. 2. Scattered deposits of hypermetabolic but benign metabolically active brown fat.    02/23/2018 - 04/09/2018 Radiation Therapy    Radiation treatment dates:   02/23/2018 - 04/09/2018  Site/dose:   The patient initially received a dose of 50.4 Gy in 28 fractions to the right chest wall and supraclavicular region. This was delivered using a 3-D conformal, 4 field technique. The patient then received a boost to the mastectomy scar. This delivered an additional 10 Gy in 5 fractions using an en face electron field. The total dose was 60.4 Gy.  Narrative: The patient tolerated radiation treatment relatively well.   The patient had some expected skin irritation as she progressed during treatment. Moist desquamation was not present at the end of treatment.    03/20/2018 -  Anti-estrogen oral therapy    -Monthly Zoladex injection starting 03/20/18  -Letrozole 2.12m2mnce dialy starting 05/03/18  -Switched to Anastrozole starting 07/2018 Due to her worsening back pain.    04/06/2018 Imaging  CT Chest wo contrast IMPRESSION: 1. Stable linear nodular thickening in the RIGHT middle lobe is favored post infectious or inflammatory scarring. 2. Calcified RIGHT hilar lymph node is consistent post infectious process. 3. Stable small LEFT upper lobe nodule. 4. Stable sclerotic lesion in the manubrium. Sclerotic lesion at T3 is more prominent (4 mm). Sclerotic lesion at L 1 is new from 07/02/2017.    04/06/2018 Imaging     04/06/2018 CT Chest IMPRESSION: 1. Stable linear nodular thickening in the RIGHT middle lobe is favored post infectious or inflammatory scarring. 2. Calcified RIGHT hilar lymph node is consistent post infectious process. 3. Stable small LEFT upper lobe nodule. 4. Stable sclerotic lesion in the manubrium. Sclerotic lesion at T3 is more prominent (4 mm). Sclerotic lesion at L 1 is new from 07/02/2017.    04/10/2018 Echocardiogram    LV EF 45%-50%    05/25/2018 Echocardiogram    LV EF 45%-50%    07/13/2018 Echocardiogram    Normal LV size with EF 55-60%    07/31/2018 Imaging    07/31/2018 Bone Scan IMPRESSION: No evidence of skeletal metastatic disease.    07/31/2018 Imaging    07/31/2018 CT CAP IMPRESSION: Residual linear and nodular peribronchovascular opacities in the right middle lobe, with other scattered areas of ground-glass opacities in the right middle, right upper and right lower lobes, which are located within the previously demonstrated bulky airspace consolidation. The findings are improved from the chest CT dated 12/30/2017, and relatively stable from PET-CT dated 02/04/2018. These may represent post infectious/post inflammatory changes or residual malignancy.  Stable partially calcified right hilar lymphadenopathy.  Interval development of nodular soft tissue thickening within the anterior mediastinum in the area of the thymus, new from chest CT dated 12/30/2017. This may represent reactive thymic tissue, thymoma or lymphadenopathy.  1.8 cm probably physiologic left ovarian cyst. Borderline thickening of the endometrium measuring 15.6 mm. Further evaluation with pelvic ultrasound may be considered.  No new or suspicious findings within the abdomen.    09/15/2018 Echocardiogram    EF 55-60%    01/28/2019 Imaging    MRI Lumbar spie 01/28/19  IMPRESSION: 1. Subcentimeter lesions in the L1 and L2 vertebral bodies, indeterminate for metastases. 2. Mild  lower lumbar disc degeneration without stenosis.    01/28/2019 Imaging    CT chest 01/28/19  IMPRESSION: Right lung post radiation changes. No active lung disease or other acute findings.  No evidence of recurrent or metastatic carcinoma within the thorax.     CURRENT THERAPY: Neratinib 240 mg daily, starting 03/15/19  INTERVAL HISTORY: I followed up with Sheila [name] for weekly virtual toxicity check via phone. She is taking full dose. Feels "ok." Had diarrhea daily this past week, up to 3 episodes one day. She takes imodium with first occurrence then lomotil with the second. She tends to be more "reactive" with anti-diarrhea meds. She has nausea frequently, compazine helps. No vomiting. She is able to drink adequately but does not eat well when nauseated. She has joint pain in her knees and intermittent numbness in left toes. She had tingling in her right arm during chemotherapy. Has mild hot flashes at night.    REVIEW OF SYSTEMS:   Constitutional: Denies fevers, chills or abnormal weight loss Ears, nose, mouth, throat, and face: Denies mucositis or sore throat Respiratory: Denies cough, dyspnea or wheezes Cardiovascular: Denies palpitation, chest discomfort or lower extremity swelling Gastrointestinal:  Denies constipation, vomiting, blood in stool, heartburn (+) up to 3 diarrhea per day (+)  nausea  Skin: Denies abnormal skin rashes Lymphatics: Denies new lymphadenopathy or easy bruising Neurological:Denies  weaknesses (+) numbness to toes of left foot (+) right arm tingling during chemotherapy (+) hot flashes at night  Behavioral/Psych: Mood is stable, no new changes  MSK: (+) joint pain, knees  All other systems were reviewed with the patient and are negative.  MEDICAL HISTORY:  Past Medical History:  Diagnosis Date  . Anxiety   . Cancer [facility])    right breast   . Cardiomyopathy ([place])   . Family history of breast cancer   . Family history of colon cancer   . GERD  (gastroesophageal reflux disease)   . Headache    couple of migraines in the past  . History of kidney stones   . Vitamin D deficiency     SURGICAL HISTORY: Past Surgical History:  Procedure Laterality Date  . egg donation    . kidney stone removal    . MASTECTOMY W/ SENTINEL NODE BIOPSY Bilateral 07/22/2017   Procedure: RIGHT TOTAL MASTECTOMY WITH RIGHT AXILLARY SENTINEL LYMPH NODE BIOPSY, LEFT PROPHYLACTIC MASTECTOMY;  Surgeon: [name], MD;  Location: [place];  Service: General;  Laterality: Bilateral;  . PORTACATH PLACEMENT Right 07/22/2017   Procedure: INSERTION PORT-A-CATH WITH [name];  Surgeon: [name], MD;  Location: [place];  Service: General;  Laterality: Right;    I have reviewed the social history and family history with the patient and they are unchanged from previous note.  ALLERGIES:  has No Known Allergies.  MEDICATIONS:  Current Outpatient Medications  Medication Sig Dispense Refill  . anastrozole (ARIMIDEX) 1 MG tablet Take 1 tablet (1 mg total) by mouth daily. 30 tablet 11  . B Complex-C (B-COMPLEX WITH VITAMIN C) tablet Take 1 tablet by mouth daily.    . Biotin 1000 MCG tablet Take 1,000 mcg by mouth 3 (three) times daily.    . Budesonide ER 9 MG TB24 TAKE 1 TABLET (9 MG) BY MOUTH DAILY. ALONG WITH NERATINIB FOR THE FIRST 1-2 MONTHS 30 tablet 2  . carvedilol (COREG) 3.125 MG tablet TAKE ONE TABLET BY MOUTH TWICE DAILY 60 tablet 5  . Cholecalciferol (VITAMIN D) 2000 units tablet Take 2,000 Units by mouth daily.    . cyclobenzaprine (FLEXERIL) 5 MG tablet Take 1 tablet (5 mg total) by mouth 2 (two) times daily as needed for muscle spasms. 30 tablet 0  . diphenoxylate-atropine (LOMOTIL) 2.5-0.025 MG tablet Take 1-2 tablets by mouth 4 (four) times daily as needed for diarrhea or loose stools. 30 tablet 3  . levothyroxine (SYNTHROID, LEVOTHROID) 25 MCG tablet Take 25 mcg by mouth daily before breakfast.    . loperamide (IMODIUM) 2 MG capsule Take 1-2 capsules  (2-4 mg total) by mouth 3 (three) times daily as needed for diarrhea or loose stools. 120 capsule 0  . losartan (COZAAR) 25 MG tablet Take 0.5 tablets (12.5 mg total) by mouth at bedtime. 15 tablet 5  . Melatonin 10 MG TABS Take 0.5 tablets by mouth.     . naproxen sodium (ANAPROX) 220 MG tablet Take 220 mg 2 (two) times daily as needed by mouth (pain).     . Neratinib Maleate (NERLYNX) 40 MG tablet Take 6 tabs (224m) once daily. Take w food 180 tablet 0  . Omega-3 Fatty Acids (OMEGA-3 FISH OIL PO) Take 100 Units by mouth.    . prochlorperazine (COMPAZINE) 10 MG tablet Take 1 tablet (10 mg total) by mouth every 6 (six) hours as needed (Nausea  or vomiting). 45 tablet 3  . venlafaxine XR (EFFEXOR-XR) 75 MG 24 hr capsule Take 3 capsules (225 mg total) by mouth daily with breakfast. 90 capsule 5   Current Facility-Administered Medications  Medication Dose Route Frequency Provider Last Rate Last Dose  . gi cocktail (Maalox,Lidocaine,Donnatal)  30 mL Oral Once Harle Stanford., PA-C        PHYSICAL EXAMINATION: Patient appears well over the phone. Speech is intact and non-pressured. Mood and behavior appear normal.   LABORATORY DATA:  None for this visit.   RADIOGRAPHIC STUDIES: None for this visit.   ASSESSMENT & PLAN: Sheila [name] a 43 y.o.femalewith   1. Malignant neoplasm of upper-outer quadrant of right breast in female, mixed invasive ductal and lobular carcinoma, pT3(m)N2aM0, stage 1b, ductal carcinoma triple positive, lobular carcinoma ER+/PR+/HER2-, Grade 2 2. Insomnia  3. Hot flash- stable 4. Bone Health 5. DiffuseBack pain, knee joint pain - stable 6. Lung lesions -seen on CT 12/2017 felt to be related to infection; nearly resolved on repeat CT 01/2019  Ms. Hodgkiss appears stable. She continues Neratinib 240 mg daily at full dose. She is tolerating moderately well with diarrhea and nausea. We reviewed symptom management. She knows to increase anti-diarrhea medication  as necessary. We reviewed being proactive rather than reactive with lomotil, imodium, and compazine to ensure adequate po intake and prevent dehydration and weight loss. She understands. If at any point she feels dehydrated we can set up IVF. She knows to call with any new or worsening concerns to set up in-person evaluation.  Continue Neratinib 240 mg daily. Will have another phone call in 1 week. She will f/u in person on 6/5 with lab and injection same day.   I spent 10 minutes non-face-to-face time in today's encounter.     [name], NP 04/05/19

## 2019-04-06 ENCOUNTER — Telehealth: Payer: Self-pay | Admitting: Nurse Practitioner

## 2019-04-06 NOTE — Telephone Encounter (Signed)
No los per 5/18. °

## 2019-04-09 ENCOUNTER — Telehealth: Payer: Self-pay | Admitting: Nurse Practitioner

## 2019-04-09 ENCOUNTER — Other Ambulatory Visit (HOSPITAL_COMMUNITY): Payer: Self-pay | Admitting: *Deleted

## 2019-04-09 MED ORDER — CARVEDILOL 3.125 MG PO TABS: 3.1250 mg | ORAL_TABLET | Freq: Two times a day (BID) | ORAL | 3 refills | Status: DC

## 2019-04-13 ENCOUNTER — Inpatient Hospital Stay (HOSPITAL_BASED_OUTPATIENT_CLINIC_OR_DEPARTMENT_OTHER): Payer: BLUE CROSS/BLUE SHIELD | Admitting: Nurse Practitioner

## 2019-04-13 ENCOUNTER — Encounter: Payer: Self-pay | Admitting: Nurse Practitioner

## 2019-04-13 DIAGNOSIS — C50411 Malignant neoplasm of upper-outer quadrant of right female breast: Secondary | ICD-10-CM | POA: Diagnosis not present

## 2019-04-13 DIAGNOSIS — Z9221 Personal history of antineoplastic chemotherapy: Secondary | ICD-10-CM | POA: Diagnosis not present

## 2019-04-13 DIAGNOSIS — Z170 Estrogen receptor positive status [ER+]: Secondary | ICD-10-CM | POA: Diagnosis not present

## 2019-04-13 DIAGNOSIS — Z79811 Long term (current) use of aromatase inhibitors: Secondary | ICD-10-CM

## 2019-04-13 DIAGNOSIS — Z923 Personal history of irradiation: Secondary | ICD-10-CM | POA: Diagnosis not present

## 2019-04-13 NOTE — Progress Notes (Signed)
[place]   [phone] [phone]   Clinic Follow up Note   Patient Care Team: [name], MD as PCP - General (Family Medicine) [name] (Family Medicine) [name], MD as Consulting Physician (General Surgery) [name], MD as Consulting Physician (Hematology) Kyung Rudd, MD as Consulting Physician (Radiation Oncology) 04/13/2019  I connected with Sheila [name] on 77/37/36 at 11:40 AM EDT by telephone and verified that I am speaking with the correct person using two identifiers.   I discussed the limitations, risks, security and privacy concerns of performing an evaluation and management service by telemedicine and the availability of in-person appointments. I also discussed with the patient that there may be a patient responsible charge related to this service. The patient expressed understanding and agreed to proceed.   Patients location: home  Providers location: [place] office   CHIEF COMPLAINT: f/u breast cancer, virtual toxicity check   SUMMARY OF ONCOLOGIC HISTORY: Oncology History    Cancer Staging Malignant neoplasm of upper-outer quadrant of right breast in female, estrogen receptor positive ([place]) Staging form: Breast, AJCC 8th Edition - Clinical stage from 06/11/2017: Stage IB (cT3, cN0, cM0, G2, ER: Positive, PR: Positive, HER2: Positive) - Signed by [name], MD on 06/16/2017 - Pathologic stage from 07/22/2017: Stage IB (pT3(m), pN2a(sn), cM0, G2, ER: Positive, PR: Positive, HER2: Positive) - Signed by [name], NP on 08/04/2017      Malignant neoplasm of upper-outer quadrant of right breast in female, estrogen receptor positive ([place])   06/11/2017 Initial Biopsy    Diagnosis 06/11/17 Breast, right, needle core biopsy, 11:00 o'clock - INVASIVE DUCTAL CARCINOMA, G2    06/11/2017 Mammogram    [name] and [facility] Diagnostic Breast Tomo Bilateral 06/11/17 IMPRESSION: 1. Highly suspicious mass within the  retroareolar right breast, extending from the 8:00 to 11:00 axes, anterior to posterior depth, extending anteriorly to the nipple with associated nipple retraction, measuring at least 5.5 cm by ultrasound  2. No sonographic evidence of metastatic lymphadenopathy in the right axilla. 3. No evidence of malignancy within the left breast.     06/11/2017 Initial Diagnosis    Malignant neoplasm of upper-outer quadrant of right breast in female, estrogen receptor positive ([place])    06/11/2017 Receptors her2    Estrogen Receptor: 100%, POSITIVE, STRONG STAINING INTENSITY Progesterone Receptor: 100%, POSITIVE, STRONG STAINING INTENSITY Proliferation Marker Ki67: 12%Proliferation Marker Ki67: 12%  HER2 - **POSITIVE** RATIO OF HER2/CEP17 SIGNALS 2.55 AVERAGE HER2 COPY NUMBER PER CELL 4.85    06/25/2017 Genetic Testing    Negative genetic testing on the 9 gene STAT panel.  The STAT Breast cancer panel offered by [facility] includes sequencing and rearrangement analysis for the following 9 genes:  ATM, BRCA1, BRCA2, CDH1, CHEK2, PALB2, PTEN, STK11 and TP53.   The report date is June 25, 2017.  Negative genetic testing on the common hereditary cancer panel.  The Hereditary Gene Panel offered by [facility] includes sequencing and/or deletion duplication testing of the following 46 genes: APC, ATM, AXIN2, BARD1, BMPR1A, BRCA1, BRCA2, BRIP1, CDH1, CDKN2A (p14ARF), CDKN2A (p16INK4a), CHEK2, CTNNA1, DICER1, EPCAM (Deletion/duplication testing only), GREM1 (promoter region deletion/duplication testing only), KIT, MEN1, MLH1, MSH2, MSH3, MSH6, MUTYH, NBN, NF1, NHTL1, PALB2, PDGFRA, PMS2, POLD1, POLE, PTEN, RAD50, RAD51C, RAD51D, SDHB, SDHC, SDHD, SMAD4, SMARCA4. STK11, TP53, TSC1, TSC2, and VHL.  The following genes were evaluated for sequence changes only: SDHA and HOXB13 c.251G>A variant only.  The report date is June 25, 2017.     07/02/2017  Imaging    CT cAP 07/02/17 IMPRESSION: 1. Subareolar right breast mass. No  compelling findings of nodal or metastatic involvement. 2. Old granulomatous disease. 3. There is a 7 mm enhancing or hyperdense focus posteriorly in segment 7 of the liver in the subcapsular region. By virtue of its small size this lesion is technically nonspecific although statistically likely to be a small benign lesions such as flash filling hemangioma. This may warrant surveillance. 4.  Prominent stool throughout the colon favors constipation. 5. Degenerative disc disease and spondylosis at L5-S1 likely causing mild impingement.    07/02/2017 Imaging    Bone scan 07/02/17 IMPRESSION: Today' s exam is negative. I ascribed the tiny focus of activity just proximal to the left antecubital region to injection site.    07/22/2017 Surgery    RIGHT TOTAL MASTECTOMY WITH RIGHT AXILLARY SENTINEL LYMPH NODE BIOPSY, LEFT PROPHYLACTIC MASTECTOM and INSERTION PORT-A-CATH WITH [name] by [name] and [facility]     07/22/2017 Pathology Results    Diagnosis 1. Breast, simple mastectomy, Left - FIBROADENOMA. - NO MALIGNANCY IDENTIFIED. 2. 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 - ADDITIONAL FOCUS OF LOBULAR CARCINOMA, GRADE 2, SPANNING 2.2 CM. - INTERMEDIATE GRADE DUCTAL CARCINOMA IN SITU. - INVASIVE CARCINOMA COMES TO WITHIN 0.2 TO 0.3 CM OF THE DEEP MARGIN, FOCALLY. - TUMOR FOCALLY INVOLVES EPIDERMIS. - LYMPHOVASCULAR INVASION OF DERMAL LYMPHATICS. - ONE OF ONE LYMPH NODES NEGATIVE FOR CARCINOMA (0/1). - SEE ONCOLOGY TABLE. 3. Lymph node, sentinel, biopsy, Right axillary - ONE LYMPH NODE WITH ISOLATED TUMOR CELLS (0/1). 4. Lymph node, sentinel, biopsy, Right - METASTATIC CARCINOMA IN ONE OF ONE LYMPH NODES (1/1). 5. Lymph node, sentinel, biopsy, Right - ONE OF ONE LYMPH NODES NEGATIVE FOR CARCINOMA (0/1). 6. Lymph node, sentinel, biopsy, Right - METASTATIC CARCINOMA IN ONE OF ONE LYMPH NODES (1/1). - EXTRACAPSULAR  EXTENSION. 7. Lymph node, sentinel, biopsy, Right - METASTATIC CARCINOMA IN ONE OF ONE LYMPH NODES (1/1). - EXTRACAPSULAR EXTENSION. 8. Lymph node, sentinel, biopsy, Right - ONE OF ONE LYMPH NODES NEGATIVE FOR CARCINOMA (0/1). 9. Lymph node, sentinel, biopsy, Right - ONE OF ONE LYMPH NODES NEGATIVE FOR CARCINOMA (0/1). 10. Lymph node, sentinel, biopsy, Right - METASTATIC CARCINOMA IN ONE OF ONE LYMPH NODES (1/1). - EXTRACAPSULAR EXTENSION. 11. Lymph node, biopsy, Right axillary - ONE OF ONE LYMPH NODES NEGATIVE FOR CARCINOMA (0/1).     07/22/2017 Receptors her2    Ductal carcinoma in primary breast tumor and node metastasis are ER 95%, PR 95% strongly positive, and HER2 + The lobular component in primary breast tumor is ER 95% positive, PR 95% positive, strong staining, HER-2 negative.   Original biopsy [id number]: Ductal morphology, Her2 FISH positive. Current specimen: Main focus is 75% ductal morphology, 25% lobular morphology. Smaller focus is lobular morphology. #2A Lobular morphology, Her IHC negative. #2E Lobular morphology, Her2 FISH negative, Her2 IHC negative. #4 50% lobular and 50% ductal morphology, Her2 FISH positive, Her2 IHC negative. #6 Ductal morphology #7 Ductal morphology #10 50% lobular, 50% ductal morphology    08/25/2017 Imaging    MRI abdomen done at [facility]:  Enhancing structure measing 5 mm is compatible with a flash filling hemangioma. No specific findings to suggest metastatic cancer    08/25/2017 Imaging    MRI Abdomen at [facility] 08/25/17  IMPRESSION: 1. Lesion in segment 7 of the liver has signal and enhancement characteristics compatible with a flash filling hemangioma. No specific findings identified to suggest metastatic  disease.    08/29/2017 - 12/12/2017 Chemotherapy    adjuvant TCHP every 3 weeks for 6 cycles starting 08/29/17. Postponed and decreased Carbo to 639m and added Onpro with cycle 2 on 09/19/17 due to neutropenic fever. Carbplatin  further reduced to 5010mstarting with cycle 3 due to thrombocytopenia. Herceptin was held for Cycle 4 and 5 due to worsening global longitudinal strain found on ECHO from 11/07/17. Completed 6 cycles on 12/12/17       09/04/2017 - 09/09/2017 Hospital Admission    Admit date: 09/04/2017 Discharge date: 09/09/2017  DISCHARGE DIAGNOSES:  Principal Problem:   [facility]:   Malignant neoplasm of upper-outer quadrant of right breast in female, estrogen receptor positive ([place]  Port-A-Cath in place   Neutropenic fever ([place]  Hyponatremia   Malignant neoplasm of breast in female, estrogen receptor positive ([place]    12/28/2017 Imaging    CT CHEST IMPRESSION: 1. 2.3 x 2.7 x 1.2 cm irregular masslike opacity within the right middle lobe worrisome for malignancy/metastasis. 2. 3 new very small sclerotic lesions, within the sternum and the body of T4, worrisome for new metastases.    12/28/2017 - 01/05/2018 Hospital Admission    Admit date: 12/28/17-01/05/18 Admission diagnosis: multifocal pneumonia, influenza A, sepsis, hypoxia Additional comments: imaging during hospitalization notable for new irregular masslike opacity in the right middle lobe worrisome for metastasis and new very small sclerotic lesion within the sternum and the body of T4, worrisome for metastasis    12/29/2017 Imaging    CT ABD/PELVIS IMPRESSION: No definite acute intra-abdominal or intrapelvic abnormalities.  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    12/30/2017 Imaging    CT HEAD IMPRESSION: No acute intracranial abnormality or enhancing lesion.    01/16/2018 - 01/22/2019 Chemotherapy    Maintence Herceptin and Perjeta every 3 weeks for 6-12 months starting 01/16/18. Held 03/2018-07/17/18 due to low EF. Completed on 01/22/19     02/04/2018 PET scan    IMPRESSION: 1. The previous consolidation in the right middle lobe currently has a more nodular and reticular  appearance with some associated mild atelectasis along its anterior margin. This demonstrates low-grade metabolic activity with maximum SUV 3.2 (background blood pool activity is 2.7). Although conceivably related to low-grade residuum from the patient's malignancy, the presence of calcified right hilar lymph nodes and small calcifications along the nodularity raise suspicion for a low-grade active granulomatous process or atypical infectious process. Surveillance is recommended. 2. Scattered deposits of hypermetabolic but benign metabolically active brown fat.    02/23/2018 - 04/09/2018 Radiation Therapy    Radiation treatment dates:   02/23/2018 - 04/09/2018  Site/dose:   The patient initially received a dose of 50.4 Gy in 28 fractions to the right chest wall and supraclavicular region. This was delivered using a 3-D conformal, 4 field technique. The patient then received a boost to the mastectomy scar. This delivered an additional 10 Gy in 5 fractions using an en face electron field. The total dose was 60.4 Gy.  Narrative: The patient tolerated radiation treatment relatively well.   The patient had some expected skin irritation as she progressed during treatment. Moist desquamation was not present at the end of treatment.    03/20/2018 -  Anti-estrogen oral therapy    -Monthly Zoladex injection starting 03/20/18  -Letrozole 2.30m37mnce dialy starting 05/03/18  -Switched to Anastrozole starting 07/2018 Due to her worsening back pain.    04/06/2018 Imaging  CT Chest wo contrast IMPRESSION: 1. Stable linear nodular thickening in the RIGHT middle lobe is favored post infectious or inflammatory scarring. 2. Calcified RIGHT hilar lymph node is consistent post infectious process. 3. Stable small LEFT upper lobe nodule. 4. Stable sclerotic lesion in the manubrium. Sclerotic lesion at T3 is more prominent (4 mm). Sclerotic lesion at L 1 is new from 07/02/2017.    04/06/2018 Imaging    04/06/2018  CT Chest IMPRESSION: 1. Stable linear nodular thickening in the RIGHT middle lobe is favored post infectious or inflammatory scarring. 2. Calcified RIGHT hilar lymph node is consistent post infectious process. 3. Stable small LEFT upper lobe nodule. 4. Stable sclerotic lesion in the manubrium. Sclerotic lesion at T3 is more prominent (4 mm). Sclerotic lesion at L 1 is new from 07/02/2017.    04/10/2018 Echocardiogram    LV EF 45%-50%    05/25/2018 Echocardiogram    LV EF 45%-50%    07/13/2018 Echocardiogram    Normal LV size with EF 55-60%    07/31/2018 Imaging    07/31/2018 Bone Scan IMPRESSION: No evidence of skeletal metastatic disease.    07/31/2018 Imaging    07/31/2018 CT CAP IMPRESSION: Residual linear and nodular peribronchovascular opacities in the right middle lobe, with other scattered areas of ground-glass opacities in the right middle, right upper and right lower lobes, which are located within the previously demonstrated bulky airspace consolidation. The findings are improved from the chest CT dated 12/30/2017, and relatively stable from PET-CT dated 02/04/2018. These may represent post infectious/post inflammatory changes or residual malignancy.  Stable partially calcified right hilar lymphadenopathy.  Interval development of nodular soft tissue thickening within the anterior mediastinum in the area of the thymus, new from chest CT dated 12/30/2017. This may represent reactive thymic tissue, thymoma or lymphadenopathy.  1.8 cm probably physiologic left ovarian cyst. Borderline thickening of the endometrium measuring 15.6 mm. Further evaluation with pelvic ultrasound may be considered.  No new or suspicious findings within the abdomen.    09/15/2018 Echocardiogram    EF 55-60%    01/28/2019 Imaging    MRI Lumbar spie 01/28/19  IMPRESSION: 1. Subcentimeter lesions in the L1 and L2 vertebral bodies, indeterminate for metastases. 2. Mild lower lumbar  disc degeneration without stenosis.    01/28/2019 Imaging    CT chest 01/28/19  IMPRESSION: Right lung post radiation changes. No active lung disease or other acute findings.  No evidence of recurrent or metastatic carcinoma within the thorax.     CURRENT THERAPY:  1. zoladex and letrozole beginning in 04/2018, switched to anastrozole due to back pain in 07/2018  2. neratinib 240 mg daily, beginning 03/15/2019   INTERVAL HISTORY: Sheila [name] is doing "okay." She continues full dose neratinib daily. She had a "bout of diarrhea" the other day and 2 episodes yesterday. None so far today. She takes lomotil for diarrhea and imodium when stool is loose. She noticed blood with wiping, not mixed with stool. She attributes to frequent BM. Po intake is adequate. She drinks a lot of water. Not much nausea, no vomiting. Hot flashes are stable, mostly at night. Back and knee pain is stable, not limiting most function or activities. Denies fever, chills, cough, dyspnea, or skin rash.    MEDICAL HISTORY:  Past Medical History:  Diagnosis Date   Anxiety    Cancer [facility])    right breast    Cardiomyopathy ([place])    Family history of breast cancer    Family history of colon  cancer    GERD (gastroesophageal reflux disease)    Headache    couple of migraines in the past   History of kidney stones    Vitamin D deficiency     SURGICAL HISTORY: Past Surgical History:  Procedure Laterality Date   egg donation     kidney stone removal     MASTECTOMY W/ SENTINEL NODE BIOPSY Bilateral 07/22/2017   Procedure: RIGHT TOTAL MASTECTOMY WITH RIGHT AXILLARY SENTINEL LYMPH NODE BIOPSY, LEFT PROPHYLACTIC MASTECTOMY;  Surgeon: [name], MD;  Location: [place];  Service: General;  Laterality: Bilateral;   PORTACATH PLACEMENT Right 07/22/2017   Procedure: INSERTION PORT-A-CATH WITH [name];  Surgeon: [name], MD;  Location: [place];  Service: General;  Laterality: Right;    I have reviewed the  social history and family history with the patient and they are unchanged from previous note.  ALLERGIES:  has No Known Allergies.  MEDICATIONS:  Current Outpatient Medications  Medication Sig Dispense Refill   anastrozole (ARIMIDEX) 1 MG tablet Take 1 tablet (1 mg total) by mouth daily. 30 tablet 11   B Complex-C (B-COMPLEX WITH VITAMIN C) tablet Take 1 tablet by mouth daily.     Biotin 1000 MCG tablet Take 1,000 mcg by mouth 3 (three) times daily.     Budesonide ER 9 MG TB24 TAKE 1 TABLET (9 MG) BY MOUTH DAILY. ALONG WITH NERATINIB FOR THE FIRST 1-2 MONTHS 30 tablet 2   carvedilol (COREG) 3.125 MG tablet Take 1 tablet (3.125 mg total) by mouth 2 (two) times daily. 180 tablet 3   Cholecalciferol (VITAMIN D) 2000 units tablet Take 2,000 Units by mouth daily.     cyclobenzaprine (FLEXERIL) 5 MG tablet Take 1 tablet (5 mg total) by mouth 2 (two) times daily as needed for muscle spasms. 30 tablet 0   diphenoxylate-atropine (LOMOTIL) 2.5-0.025 MG tablet Take 1-2 tablets by mouth 4 (four) times daily as needed for diarrhea or loose stools. 30 tablet 3   levothyroxine (SYNTHROID, LEVOTHROID) 25 MCG tablet Take 25 mcg by mouth daily before breakfast.     loperamide (IMODIUM) 2 MG capsule Take 1-2 capsules (2-4 mg total) by mouth 3 (three) times daily as needed for diarrhea or loose stools. 120 capsule 0   losartan (COZAAR) 25 MG tablet Take 0.5 tablets (12.5 mg total) by mouth at bedtime. 15 tablet 5   Melatonin 10 MG TABS Take 0.5 tablets by mouth.      naproxen sodium (ANAPROX) 220 MG tablet Take 220 mg 2 (two) times daily as needed by mouth (pain).      Neratinib Maleate (NERLYNX) 40 MG tablet Take 6 tabs (279m) once daily. Take w food 180 tablet 0   Omega-3 Fatty Acids (OMEGA-3 FISH OIL PO) Take 100 Units by mouth.     prochlorperazine (COMPAZINE) 10 MG tablet Take 1 tablet (10 mg total) by mouth every 6 (six) hours as needed (Nausea or vomiting). 45 tablet 3   venlafaxine XR  (EFFEXOR-XR) 75 MG 24 hr capsule Take 3 capsules (225 mg total) by mouth daily with breakfast. 90 capsule 5   Current Facility-Administered Medications  Medication Dose Route Frequency Provider Last Rate Last Dose   gi cocktail (Maalox,Lidocaine,Donnatal)  30 mL Oral Once THarle Stanford, PA-C        PHYSICAL EXAMINATION: Patient appears well over the phone. Speech is clear and non-pressured. Mood and behavior appear normal.   LABORATORY DATA:  None for this visit.   RADIOGRAPHIC STUDIES: None for this  visit.   ASSESSMENT & PLAN: Sheila [name] a 43 y.o.femalewith   1. Malignant neoplasm of upper-outer quadrant of right breast in female, mixed invasive ductal and lobular carcinoma, pT3(m)N2aM0, stage 1b, ductal carcinoma triple positive, lobular carcinoma ER+/PR+/HER2-, Grade 2 2. Insomnia  3. Hot flash- stable 4. Bone Health 5. DiffuseBack pain, knee joint pain - stable 6. Lung lesions -seen on CT 12/2017 felt to be related to infection; nearly resolved on repeat CT 01/2019  Sheila [name] appears stable. She continues neratinib 240 mg daily at full dose. She does not need a refill. She is tolerating well; nausea and diarrhea are improving. She uses imodium and lomotil PRN. Po intake appears adequate. She continues anastrozole with tolerable hot flash and joint pain in back and knees. She will continue anastrozole and neratinib 240 mg daily. Virtual visit next week and in-person f/u with Dr. Burr Medico on 6/5 with injection. She knows to call sooner to seek in-person evaluation with new or worsening concerns.   I spent 7 minutes non-face-to-face time in today's encounter.    [name], NP 04/13/19

## 2019-04-14 ENCOUNTER — Telehealth: Payer: Self-pay | Admitting: Hematology

## 2019-04-14 ENCOUNTER — Telehealth: Payer: Self-pay | Admitting: Nurse Practitioner

## 2019-04-14 NOTE — Progress Notes (Signed)
[name]   [phone] [phone]   Clinic Follow up Note   Patient Care Team: [name], MD as PCP - General (Family Medicine) [name] (Family Medicine) [name], MD as Consulting Physician (General Surgery) [name], MD as Consulting Physician (Hematology) Kyung Rudd, MD as Consulting Physician (Radiation Oncology)  Date of Service:  04/15/2019  CHIEF COMPLAINT: F/u of right breast cancerand menstrual period  SUMMARY OF ONCOLOGIC HISTORY: Oncology History    Cancer Staging Malignant neoplasm of upper-outer quadrant of right breast in female, estrogen receptor positive ([place]) Staging form: Breast, AJCC 8th Edition - Clinical stage from 06/11/2017: Stage IB (cT3, cN0, cM0, G2, ER: Positive, PR: Positive, HER2: Positive) - Signed by [name], MD on 06/16/2017 - Pathologic stage from 07/22/2017: Stage IB (pT3(m), pN2a(sn), cM0, G2, ER: Positive, PR: Positive, HER2: Positive) - Signed by [name], NP on 08/04/2017      Malignant neoplasm of upper-outer quadrant of right breast in female, estrogen receptor positive ([place])   06/11/2017 Initial Biopsy    Diagnosis 06/11/17 Breast, right, needle core biopsy, 11:00 o'clock - INVASIVE DUCTAL CARCINOMA, G2    06/11/2017 Mammogram    [name] and MM Diagnostic Breast Tomo Bilateral 06/11/17 IMPRESSION: 1. Highly suspicious mass within the retroareolar right breast, extending from the 8:00 to 11:00 axes, anterior to posterior depth, extending anteriorly to the nipple with associated nipple retraction, measuring at least 5.5 cm by ultrasound  2. No sonographic evidence of metastatic lymphadenopathy in the right axilla. 3. No evidence of malignancy within the left breast.     06/11/2017 Initial Diagnosis    Malignant neoplasm of upper-outer quadrant of right breast in female, estrogen receptor positive ([name])    06/11/2017 Receptors her2    Estrogen Receptor: 100%, POSITIVE,  STRONG STAINING INTENSITY Progesterone Receptor: 100%, POSITIVE, STRONG STAINING INTENSITY Proliferation Marker Ki67: 12%Proliferation Marker Ki67: 12%  HER2 - **POSITIVE** RATIO OF HER2/CEP17 SIGNALS 2.55 AVERAGE HER2 COPY NUMBER PER CELL 4.85    06/25/2017 Genetic Testing    Negative genetic testing on the 9 gene STAT panel.  The STAT Breast cancer panel offered by [facility] includes sequencing and rearrangement analysis for the following 9 genes:  ATM, BRCA1, BRCA2, CDH1, CHEK2, PALB2, PTEN, STK11 and TP53.   The report date is June 25, 2017.  Negative genetic testing on the common hereditary cancer panel.  The Hereditary Gene Panel offered by [facility] includes sequencing and/or deletion duplication testing of the following 46 genes: APC, ATM, AXIN2, BARD1, BMPR1A, BRCA1, BRCA2, BRIP1, CDH1, CDKN2A (p14ARF), CDKN2A (p16INK4a), CHEK2, CTNNA1, DICER1, EPCAM (Deletion/duplication testing only), GREM1 (promoter region deletion/duplication testing only), KIT, MEN1, MLH1, MSH2, MSH3, MSH6, MUTYH, NBN, NF1, NHTL1, PALB2, PDGFRA, PMS2, POLD1, POLE, PTEN, RAD50, RAD51C, RAD51D, SDHB, SDHC, SDHD, SMAD4, SMARCA4. STK11, TP53, TSC1, TSC2, and VHL.  The following genes were evaluated for sequence changes only: SDHA and HOXB13 c.251G>A variant only.  The report date is June 25, 2017.     07/02/2017 Imaging    CT cAP 07/02/17 IMPRESSION: 1. Subareolar right breast mass. No compelling findings of nodal or metastatic involvement. 2. Old granulomatous disease. 3. There is a 7 mm enhancing or hyperdense focus posteriorly in segment 7 of the liver in the subcapsular region. By virtue of its small size this lesion is technically nonspecific although statistically likely to be a small benign lesions such as flash filling hemangioma. This may warrant surveillance. 4.  Prominent stool throughout the colon favors constipation. 5. 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     07/02/2017 Imaging    Bone scan 07/02/17 IMPRESSION: Today' s exam is negative. I ascribed the tiny focus of activity just proximal to the left antecubital region to injection site.    07/22/2017 Surgery    RIGHT TOTAL MASTECTOMY WITH RIGHT AXILLARY SENTINEL LYMPH NODE BIOPSY, LEFT PROPHYLACTIC MASTECTOM and INSERTION PORT-A-CATH WITH [name] by [name] and [name]     07/22/2017 Pathology Results    Diagnosis 1. Breast, simple mastectomy, Left - FIBROADENOMA. - NO MALIGNANCY IDENTIFIED. 2. 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 - ADDITIONAL FOCUS OF LOBULAR CARCINOMA, GRADE 2, SPANNING 2.2 CM. - INTERMEDIATE GRADE DUCTAL CARCINOMA IN SITU. - INVASIVE CARCINOMA COMES TO WITHIN 0.2 TO 0.3 CM OF THE DEEP MARGIN, FOCALLY. - TUMOR FOCALLY INVOLVES EPIDERMIS. - LYMPHOVASCULAR INVASION OF DERMAL LYMPHATICS. - ONE OF ONE LYMPH NODES NEGATIVE FOR CARCINOMA (0/1). - SEE ONCOLOGY TABLE. 3. Lymph node, sentinel, biopsy, Right axillary - ONE LYMPH NODE WITH ISOLATED TUMOR CELLS (0/1). 4. Lymph node, sentinel, biopsy, Right - METASTATIC CARCINOMA IN ONE OF ONE LYMPH NODES (1/1). 5. Lymph node, sentinel, biopsy, Right - ONE OF ONE LYMPH NODES NEGATIVE FOR CARCINOMA (0/1). 6. Lymph node, sentinel, biopsy, Right - METASTATIC CARCINOMA IN ONE OF ONE LYMPH NODES (1/1). - EXTRACAPSULAR EXTENSION. 7. Lymph node, sentinel, biopsy, Right - METASTATIC CARCINOMA IN ONE OF ONE LYMPH NODES (1/1). - EXTRACAPSULAR EXTENSION. 8. Lymph node, sentinel, biopsy, Right - ONE OF ONE LYMPH NODES NEGATIVE FOR CARCINOMA (0/1). 9. Lymph node, sentinel, biopsy, Right - ONE OF ONE LYMPH NODES NEGATIVE FOR CARCINOMA (0/1). 10. Lymph node, sentinel, biopsy, Right - METASTATIC CARCINOMA IN ONE OF ONE LYMPH NODES (1/1). - EXTRACAPSULAR EXTENSION. 11. Lymph node, biopsy, Right axillary - ONE OF ONE LYMPH NODES NEGATIVE FOR CARCINOMA (0/1).      07/22/2017 Receptors her2    Ductal carcinoma in primary breast tumor and node metastasis are ER 95%, PR 95% strongly positive, and HER2 + The lobular component in primary breast tumor is ER 95% positive, PR 95% positive, strong staining, HER-2 negative.   Original biopsy [id number]: Ductal morphology, Her2 FISH positive. Current specimen: Main focus is 75% ductal morphology, 25% lobular morphology. Smaller focus is lobular morphology. #2A Lobular morphology, Her IHC negative. #2E Lobular morphology, Her2 FISH negative, Her2 IHC negative. #4 50% lobular and 50% ductal morphology, Her2 FISH positive, Her2 IHC negative. #6 Ductal morphology #7 Ductal morphology #10 50% lobular, 50% ductal morphology    08/25/2017 Imaging    MRI abdomen done at [facility]:  Enhancing structure measing 5 mm is compatible with a flash filling hemangioma. No specific findings to suggest metastatic cancer    08/25/2017 Imaging    MRI Abdomen at [facility] 08/25/17  IMPRESSION: 1. Lesion in segment 7 of the liver has signal and enhancement characteristics compatible with a flash filling hemangioma. No specific findings identified to suggest metastatic disease.    08/29/2017 - 12/12/2017 Chemotherapy    adjuvant TCHP every 3 weeks for 6 cycles starting 08/29/17. Postponed and decreased Carbo to 622m and added Onpro with cycle 2 on 09/19/17 due to neutropenic fever. Carbplatin further reduced to 5076mstarting with cycle 3 due to thrombocytopenia. Herceptin was held for Cycle 4 and 5 due to worsening global longitudinal strain found on ECHO from 11/07/17. Completed 6 cycles on 12/12/17       09/04/2017 - 09/09/2017 Hospital Admission  Admit date: 09/04/2017 Discharge date: 09/09/2017  DISCHARGE DIAGNOSES:  Principal Problem:   Sepsis [facility]) Active Problems:   Malignant neoplasm of upper-outer quadrant of right breast in female, estrogen receptor positive ([place])   Port-A-Cath in place   Neutropenic fever ([place])    Hyponatremia   Malignant neoplasm of breast in female, estrogen receptor positive ([place])     12/28/2017 Imaging    CT CHEST IMPRESSION: 1. 2.3 x 2.7 x 1.2 cm irregular masslike opacity within the right middle lobe worrisome for malignancy/metastasis. 2. 3 new very small sclerotic lesions, within the sternum and the body of T4, worrisome for new metastases.    12/28/2017 - 01/05/2018 Hospital Admission    Admit date: 12/28/17-01/05/18 Admission diagnosis: multifocal pneumonia, influenza A, sepsis, hypoxia Additional comments: imaging during hospitalization notable for new irregular masslike opacity in the right middle lobe worrisome for metastasis and new very small sclerotic lesion within the sternum and the body of T4, worrisome for metastasis    12/29/2017 Imaging    CT ABD/PELVIS IMPRESSION: No definite acute intra-abdominal or intrapelvic abnormalities.  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    12/30/2017 Imaging    CT HEAD IMPRESSION: No acute intracranial abnormality or enhancing lesion.    01/16/2018 - 01/22/2019 Chemotherapy    Maintence Herceptin and Perjeta every 3 weeks for 6-12 months starting 01/16/18. Held 03/2018-07/17/18 due to low EF. Completed on 01/22/19     02/04/2018 PET scan    IMPRESSION: 1. The previous consolidation in the right middle lobe currently has a more nodular and reticular appearance with some associated mild atelectasis along its anterior margin. This demonstrates low-grade metabolic activity with maximum SUV 3.2 (background blood pool activity is 2.7). Although conceivably related to low-grade residuum from the patient's malignancy, the presence of calcified right hilar lymph nodes and small calcifications along the nodularity raise suspicion for a low-grade active granulomatous process or atypical infectious process. Surveillance is recommended. 2. Scattered deposits of hypermetabolic but benign metabolically  active brown fat.    02/23/2018 - 04/09/2018 Radiation Therapy    Radiation treatment dates:   02/23/2018 - 04/09/2018  Site/dose:   The patient initially received a dose of 50.4 Gy in 28 fractions to the right chest wall and supraclavicular region. This was delivered using a 3-D conformal, 4 field technique. The patient then received a boost to the mastectomy scar. This delivered an additional 10 Gy in 5 fractions using an en face electron field. The total dose was 60.4 Gy.  Narrative: The patient tolerated radiation treatment relatively well.   The patient had some expected skin irritation as she progressed during treatment. Moist desquamation was not present at the end of treatment.    03/20/2018 -  Anti-estrogen oral therapy    -Monthly Zoladex injection starting 03/20/18  -Letrozole 2.48m once dialy starting 05/03/18  -Switched to Anastrozole starting 07/2018 Due to her worsening back pain.    04/06/2018 Imaging    CT Chest wo contrast IMPRESSION: 1. Stable linear nodular thickening in the RIGHT middle lobe is favored post infectious or inflammatory scarring. 2. Calcified RIGHT hilar lymph node is consistent post infectious process. 3. Stable small LEFT upper lobe nodule. 4. Stable sclerotic lesion in the manubrium. Sclerotic lesion at T3 is more prominent (4 mm). Sclerotic lesion at L 1 is new from 07/02/2017.    04/06/2018 Imaging    04/06/2018 CT Chest IMPRESSION: 1. Stable linear nodular thickening in the RIGHT middle lobe is favored  post infectious or inflammatory scarring. 2. Calcified RIGHT hilar lymph node is consistent post infectious process. 3. Stable small LEFT upper lobe nodule. 4. Stable sclerotic lesion in the manubrium. Sclerotic lesion at T3 is more prominent (4 mm). Sclerotic lesion at L 1 is new from 07/02/2017.    04/10/2018 Echocardiogram    LV EF 45%-50%    05/25/2018 Echocardiogram    LV EF 45%-50%    07/13/2018 Echocardiogram    Normal LV size with EF  55-60%    07/31/2018 Imaging    07/31/2018 Bone Scan IMPRESSION: No evidence of skeletal metastatic disease.    07/31/2018 Imaging    07/31/2018 CT CAP IMPRESSION: Residual linear and nodular peribronchovascular opacities in the right middle lobe, with other scattered areas of ground-glass opacities in the right middle, right upper and right lower lobes, which are located within the previously demonstrated bulky airspace consolidation. The findings are improved from the chest CT dated 12/30/2017, and relatively stable from PET-CT dated 02/04/2018. These may represent post infectious/post inflammatory changes or residual malignancy.  Stable partially calcified right hilar lymphadenopathy.  Interval development of nodular soft tissue thickening within the anterior mediastinum in the area of the thymus, new from chest CT dated 12/30/2017. This may represent reactive thymic tissue, thymoma or lymphadenopathy.  1.8 cm probably physiologic left ovarian cyst. Borderline thickening of the endometrium measuring 15.6 mm. Further evaluation with pelvic ultrasound may be considered.  No new or suspicious findings within the abdomen.    09/15/2018 Echocardiogram    EF 55-60%    01/28/2019 Imaging    MRI Lumbar spie 01/28/19  IMPRESSION: 1. Subcentimeter lesions in the L1 and L2 vertebral bodies, indeterminate for metastases. 2. Mild lower lumbar disc degeneration without stenosis.    01/28/2019 Imaging    CT chest 01/28/19  IMPRESSION: Right lung post radiation changes. No active lung disease or other acute findings.  No evidence of recurrent or metastatic carcinoma within the thorax.      CURRENT THERAPY:  -Monthly Zoladex injections starting 03/20/18  -Letrozole 2.98m once daily starting 05/03/18, changed to anastrozole in 07/2018 -Neratinib 240 mg daily, starting 03/15/19   INTERVAL HISTORY:  Sheila [name] here for a follow up of treatment. She presents to the clinic  alone. She notes she restarted Menstrual period with cramps which is light to moderate. It has been going on for 3 days now. She notes her abnormal Pap smear would come from it being close to her periods. Mostly were normal.   She notes being on week 5 of Nerlynx. She does have mouth dryness but drinks water adequately. She notes joint pain and fatigue occasionally and with certain activities. She notes her toes feel numb occasionally. She is taking Letrozole. I reviewed her medication list with her.    REVIEW OF SYSTEMS:   Constitutional: Denies fevers, chills or abnormal weight loss (+)improved fatigue  Eyes: Denies blurriness of vision Ears, nose, mouth, throat, and face: Denies mucositis or sore throat (+) dry mouth  Respiratory: Denies cough, dyspnea or wheezes Cardiovascular: Denies palpitation, chest discomfort or lower extremity swelling Gastrointestinal:  Denies nausea, heartburn or change in bowel habits Skin: Denies abnormal skin rashes MSK: (+) joint pain occasionally  UA: (+)menstrual period  Lymphatics: Denies new lymphadenopathy or easy bruising Neurological: (+) Occasional numbness of toes  Behavioral/Psych: Mood is stable, no new changes  All other systems were reviewed with the patient and are negative.  MEDICAL HISTORY:  Past Medical History:  Diagnosis Date  .  Anxiety   . Cancer [facility])    right breast   . Cardiomyopathy ([place])   . Family history of breast cancer   . Family history of colon cancer   . GERD (gastroesophageal reflux disease)   . Headache    couple of migraines in the past  . History of kidney stones   . Vitamin D deficiency     SURGICAL HISTORY: Past Surgical History:  Procedure Laterality Date  . egg donation    . kidney stone removal    . MASTECTOMY W/ SENTINEL NODE BIOPSY Bilateral 07/22/2017   Procedure: RIGHT TOTAL MASTECTOMY WITH RIGHT AXILLARY SENTINEL LYMPH NODE BIOPSY, LEFT PROPHYLACTIC MASTECTOMY;  Surgeon: [name], MD;   Location: [place];  Service: General;  Laterality: Bilateral;  . PORTACATH PLACEMENT Right 07/22/2017   Procedure: INSERTION PORT-A-CATH WITH [name];  Surgeon: [name], MD;  Location: [place];  Service: General;  Laterality: Right;    I have reviewed the social history and family history with the patient and they are unchanged from previous note.  ALLERGIES:  has No Known Allergies.  MEDICATIONS:  Current Outpatient Medications  Medication Sig Dispense Refill  . anastrozole (ARIMIDEX) 1 MG tablet Take 1 tablet (1 mg total) by mouth daily. 30 tablet 11  . B Complex-C (B-COMPLEX WITH VITAMIN C) tablet Take 1 tablet by mouth daily.    . Biotin 1000 MCG tablet Take 1,000 mcg by mouth 3 (three) times daily.    . Budesonide ER 9 MG TB24 TAKE 1 TABLET (9 MG) BY MOUTH DAILY. ALONG WITH NERATINIB FOR THE FIRST 1-2 MONTHS 30 tablet 2  . carvedilol (COREG) 3.125 MG tablet Take 1 tablet (3.125 mg total) by mouth 2 (two) times daily. 180 tablet 3  . Cholecalciferol (VITAMIN D) 2000 units tablet Take 2,000 Units by mouth daily.    . cyclobenzaprine (FLEXERIL) 5 MG tablet Take 1 tablet (5 mg total) by mouth 2 (two) times daily as needed for muscle spasms. 30 tablet 0  . diphenoxylate-atropine (LOMOTIL) 2.5-0.025 MG tablet Take 1-2 tablets by mouth 4 (four) times daily as needed for diarrhea or loose stools. 30 tablet 3  . levothyroxine (SYNTHROID, LEVOTHROID) 25 MCG tablet Take 25 mcg by mouth daily before breakfast.    . loperamide (IMODIUM) 2 MG capsule Take 1-2 capsules (2-4 mg total) by mouth 3 (three) times daily as needed for diarrhea or loose stools. 120 capsule 0  . losartan (COZAAR) 25 MG tablet Take 0.5 tablets (12.5 mg total) by mouth at bedtime. 15 tablet 5  . Melatonin 10 MG TABS Take 0.5 tablets by mouth.     . naproxen sodium (ANAPROX) 220 MG tablet Take 220 mg 2 (two) times daily as needed by mouth (pain).     . Neratinib Maleate (NERLYNX) 40 MG tablet Take 6 tabs (240m) once daily. Take  w food 180 tablet 2  . prochlorperazine (COMPAZINE) 10 MG tablet Take 1 tablet (10 mg total) by mouth every 6 (six) hours as needed (Nausea or vomiting). 45 tablet 3  . venlafaxine XR (EFFEXOR-XR) 75 MG 24 hr capsule Take 3 capsules (225 mg total) by mouth daily with breakfast. 90 capsule 5   Current Facility-Administered Medications  Medication Dose Route Frequency Provider Last Rate Last Dose  . gi cocktail (Maalox,Lidocaine,Donnatal)  30 mL Oral Once THarle Stanford, PA-C        PHYSICAL EXAMINATION: ECOG PERFORMANCE STATUS: 1 - Symptomatic but completely ambulatory  Vitals:   04/15/19 1512  BP:  100/68  Pulse: 88  Resp: 18  Temp: (!) 97.5 F (36.4 C)  SpO2: 100%   Filed Weights   04/15/19 1512  Weight: 167 lb 14.4 oz (76.2 kg)    GENERAL:alert, no distress and comfortable SKIN: skin color, texture, turgor are normal, no rashes or significant lesions EYES: normal, Conjunctiva are pink and non-injected, sclera clear  NECK: supple, thyroid normal size, non-tender, without nodularity LYMPH:  no palpable lymphadenopathy in the cervical, axillary  LUNGS: clear to auscultation and percussion with normal breathing effort HEART: regular rate & rhythm and no murmurs and no lower extremity edema ABDOMEN:abdomen soft, non-tender and normal bowel sounds Musculoskeletal:no cyanosis of digits and no clubbing  NEURO: alert & oriented x 3 with fluent speech, no focal motor/sensory deficits  LABORATORY DATA:  I have reviewed the data as listed CBC Latest Ref Rng & Units 04/15/2019 03/25/2019 02/26/2019  WBC 4.0 - 10.5 K/uL 4.4 4.7 3.3(L)  Hemoglobin 12.0 - 15.0 g/dL 10.7(L) 11.0(L) 12.2  Hematocrit 36.0 - 46.0 % 33.0(L) 33.4(L) 36.8  Platelets 150 - 400 K/uL 163 178 179     CMP Latest Ref Rng & Units 04/15/2019 03/25/2019 02/26/2019  Glucose 70 - 99 mg/dL 122(H) 106(H) 125(H)  BUN 6 - 20 mg/dL _0 Creatinine 0.44 - 1.00 mg/dL 1.08(H) 1.10(H) 1.18(H)  Sodium 135 - 145 mmol/L 139 141  140  Potassium 3.5 - 5.1 mmol/L 3.2(L) 3.5 4.1  Chloride 98 - 111 mmol/L 106 107 108  CO2 22 - 32 mmol/L _1 Calcium 8.9 - 10.3 mg/dL 8.4(L) 8.5(L) 8.8(L)  Total Protein 6.5 - 8.1 g/dL 6.6 7.0 7.4  Total Bilirubin 0.3 - 1.2 mg/dL 0.3 0.3 0.4  Alkaline Phos 38 - 126 U/L 87 100 117  AST 15 - 41 U/L 10(L) 9(L) 15  ALT 0 - 44 U/L _2 RADIOGRAPHIC STUDIES: I have personally reviewed the radiological images as listed and agreed with the findings in the report. No results found.   ASSESSMENT & PLAN:  Kashana Breach is a 43 y.o. female with   1. Vaginal bleeding   -Started 04/12/19 with cramping, light to moderate flow, fist time since she started Zoladex injections on 03/20/18. She has been on this for year.  -No sure if this is zoladex failure, I checked her estradiol level today, result is pending   -we discussed other possibility of postmenopausal bleeding, may need a vaginal [name]. She will find Gyn to follow up with.  -I discussed if this is Zoladex failure, then switch her anastrozole to Tamoxifen.  -Her last zoladex injection was 3 weeks ago, will give it again today  -She still plans to wait until end of 2020 to proceed with BSO   2. Malignant neoplasm of upper-outer quadrant of right breast in female, mixed invasive ductal and lobular carcinoma, pT3(m)N2aM0, stage 1b, ductal carcinoma triple positive, lobular carcinoma ER+/PR+/HER2-, Grade 2 -She was diagnosed in 05/2017. She is s/pb/lmastectomy, adjuvant TCHP and radiation and maintenance Herceptin/Perjeta.  -Her genetic testing was negative. -She is currently on adjuvant anastrozole with monthlyZoladexinjections, tolerating well. -She has cancelled her BSO for now,and wants to have the surgerylater this year. Shewill continue Zoladex injectionsmonthly -She started Neratinib 280m daily on 03/15/19. She is tolerating moderately well with mid diarrhea and nausea, which are managble. -Labs reviewed, CBC WNL  except Hg 10.7, CMP unremarkable. Estradial level is still pending. I recommend she start OTC multivitamin with B12 and  iron. Proceed with Zoladex today  -Will continue Neratinib and Anastrozole -f/u in 4 weeks   3. Insomnia  -She has history of insomnia failed multiple agents such as lunesta, trazodone, tylenol pm, and xanax and only had mild relief with Remeron. -She previously was able to ween off Ativan andAmbienwhich improved her sleep. -She currently takeshigh doseMelatonin to help her sleep, is not very effective. -Continues to improve.Not mentioned today, likely resolved.  4. Hot flash.  -Now mild and tolerable. ContinueEffexor238m daily.   5. Bone Health -Continue Vitamin D supplement and Vitamin C rich food -We will order a bone density scan in 2020   6. DiffuseBack pain -Her CT Chest on 04/06/18 showed sclerotic lesions of spine in T3 and L1, buther bone scan in 07/2018 was negative  -Letrozole was changed to anastrozole in September 2019due to arthralgia -MRI lumbar from 01/28/19 shows lesions of L1 and L2 indeterminate, will continue follow up   -Joint pain mildly exacerbated by Anastrozole.  -She will continue Flexeril and aleve for pain.   7. Lung lesions  -She was found to have masslike opacity in the right middle lobe when she was admitted for neutropenic fever in February 2019, it got progressively worse during the hospital stay, felt to be related to pneumonia, on subsequent follow-up scan, this has much improved, but still has some residual nodule opacity and groundglass opacity in the right lung on the last CT scan in September 2019. -CT Chest from 01/28/19 showed overall much improved/near resolved, no concern for metastatic disease or new pulmonary nodules. I do not plan to repeat more CT chest for f/u.    PLAN: -Proceed with Zoladex today  -Continue Neratinib and anastrozole  -Lab, f/u and injection in 4 weeks -Phone visit with Lacie in  2 weeks  -I will call her with estradiol level, and referral her to GYN if needed    No problem-specific Assessment & Plan notes found for this encounter.   No orders of the defined types were placed in this encounter.  All questions were answered. The patient knows to call the clinic with any problems, questions or concerns. No barriers to learning was detected. I spent 20 minutes counseling the patient face to face. The total time spent in the appointment was 25 minutes and more than 50% was on counseling and review of test results     YTruitt Merle MD 04/15/2019   I, AJoslyn Devon am acting as scribe for YTruitt Merle MD.   I have reviewed the above documentation for accuracy and completeness, and I agree with the above.

## 2019-04-14 NOTE — Telephone Encounter (Signed)
Sheila [name] called yesterday to report she started her period after not having a cycle for 2 years (since beginning chemotherapy). She has received monthly zoladex injections and began AI since 04/2018, no missed doses. Last injection 03/25/19. She notes "more than a teaspoon" of vaginal bleeding that "came out in a clot" which began 5/26. I discussed with Dr. Burr Medico. The cause of her breakthrough vaginal bleeding is unclear. I called her back; bleeding seems lighter this morning, which is how her cycles used to present. She is due for next injection 04/23/19. Dr. Burr Medico plans to bring her in for f/u and injection one week early. I sent schedule message to move her appts. She verbalizes understanding and appreciates the call.  Cira Rue, NP  04/14/19

## 2019-04-14 NOTE — Telephone Encounter (Signed)
No los per 5/26.

## 2019-04-14 NOTE — Telephone Encounter (Signed)
Scheduled appt per 5/27 sch message - pt is aware of appt date and time

## 2019-04-15 ENCOUNTER — Inpatient Hospital Stay: Payer: BLUE CROSS/BLUE SHIELD

## 2019-04-15 ENCOUNTER — Inpatient Hospital Stay (HOSPITAL_BASED_OUTPATIENT_CLINIC_OR_DEPARTMENT_OTHER): Payer: BLUE CROSS/BLUE SHIELD | Admitting: Hematology

## 2019-04-15 ENCOUNTER — Encounter: Payer: Self-pay | Admitting: Hematology

## 2019-04-15 ENCOUNTER — Other Ambulatory Visit: Payer: Self-pay | Admitting: Hematology

## 2019-04-15 ENCOUNTER — Other Ambulatory Visit: Payer: Self-pay

## 2019-04-15 VITALS — BP 100/68 | HR 88 | Temp 97.5°F | Resp 18 | Ht 66.0 in | Wt 167.9 lb

## 2019-04-15 DIAGNOSIS — Z923 Personal history of irradiation: Secondary | ICD-10-CM

## 2019-04-15 DIAGNOSIS — R918 Other nonspecific abnormal finding of lung field: Secondary | ICD-10-CM

## 2019-04-15 DIAGNOSIS — G4700 Insomnia, unspecified: Secondary | ICD-10-CM

## 2019-04-15 DIAGNOSIS — R232 Flushing: Secondary | ICD-10-CM | POA: Diagnosis not present

## 2019-04-15 DIAGNOSIS — Z9221 Personal history of antineoplastic chemotherapy: Secondary | ICD-10-CM | POA: Diagnosis not present

## 2019-04-15 DIAGNOSIS — Z79811 Long term (current) use of aromatase inhibitors: Secondary | ICD-10-CM | POA: Diagnosis not present

## 2019-04-15 DIAGNOSIS — C50411 Malignant neoplasm of upper-outer quadrant of right female breast: Secondary | ICD-10-CM

## 2019-04-15 DIAGNOSIS — Z170 Estrogen receptor positive status [ER+]: Secondary | ICD-10-CM

## 2019-04-15 DIAGNOSIS — R200 Anesthesia of skin: Secondary | ICD-10-CM

## 2019-04-15 DIAGNOSIS — R197 Diarrhea, unspecified: Secondary | ICD-10-CM | POA: Diagnosis not present

## 2019-04-15 DIAGNOSIS — Z7951 Long term (current) use of inhaled steroids: Secondary | ICD-10-CM

## 2019-04-15 DIAGNOSIS — N939 Abnormal uterine and vaginal bleeding, unspecified: Secondary | ICD-10-CM

## 2019-04-15 DIAGNOSIS — R110 Nausea: Secondary | ICD-10-CM

## 2019-04-15 DIAGNOSIS — R682 Dry mouth, unspecified: Secondary | ICD-10-CM | POA: Diagnosis not present

## 2019-04-15 DIAGNOSIS — R5383 Other fatigue: Secondary | ICD-10-CM | POA: Diagnosis not present

## 2019-04-15 DIAGNOSIS — M549 Dorsalgia, unspecified: Secondary | ICD-10-CM | POA: Diagnosis not present

## 2019-04-15 DIAGNOSIS — Z9013 Acquired absence of bilateral breasts and nipples: Secondary | ICD-10-CM | POA: Diagnosis not present

## 2019-04-15 DIAGNOSIS — Z95828 Presence of other vascular implants and grafts: Secondary | ICD-10-CM

## 2019-04-15 DIAGNOSIS — Z79899 Other long term (current) drug therapy: Secondary | ICD-10-CM

## 2019-04-15 LAB — CBC WITH DIFFERENTIAL/PLATELET
Abs Immature Granulocytes: 0.01 10*3/uL (ref 0.00–0.07)
Basophils Absolute: 0 10*3/uL (ref 0.0–0.1)
Basophils Relative: 1 %
Eosinophils Absolute: 0.1 10*3/uL (ref 0.0–0.5)
Eosinophils Relative: 1 %
HCT: 33 % — ABNORMAL LOW (ref 36.0–46.0)
Hemoglobin: 10.7 g/dL — ABNORMAL LOW (ref 12.0–15.0)
Immature Granulocytes: 0 %
Lymphocytes Relative: 34 %
Lymphs Abs: 1.5 10*3/uL (ref 0.7–4.0)
MCH: 30.7 pg (ref 26.0–34.0)
MCHC: 32.4 g/dL (ref 30.0–36.0)
MCV: 94.6 fL (ref 80.0–100.0)
Monocytes Absolute: 0.4 10*3/uL (ref 0.1–1.0)
Monocytes Relative: 9 %
Neutro Abs: 2.4 10*3/uL (ref 1.7–7.7)
Neutrophils Relative %: 55 %
Platelets: 163 10*3/uL (ref 150–400)
RBC: 3.49 MIL/uL — ABNORMAL LOW (ref 3.87–5.11)
RDW: 13.2 % (ref 11.5–15.5)
WBC: 4.4 10*3/uL (ref 4.0–10.5)
nRBC: 0 % (ref 0.0–0.2)

## 2019-04-15 LAB — COMPREHENSIVE METABOLIC PANEL
ALT: 11 U/L (ref 0–44)
AST: 10 U/L — ABNORMAL LOW (ref 15–41)
Albumin: 3.2 g/dL — ABNORMAL LOW (ref 3.5–5.0)
Alkaline Phosphatase: 87 U/L (ref 38–126)
Anion gap: 6 (ref 5–15)
BUN: 13 mg/dL (ref 6–20)
CO2: 27 mmol/L (ref 22–32)
Calcium: 8.4 mg/dL — ABNORMAL LOW (ref 8.9–10.3)
Chloride: 106 mmol/L (ref 98–111)
Creatinine, Ser: 1.08 mg/dL — ABNORMAL HIGH (ref 0.44–1.00)
GFR calc Af Amer: >60 mL/min (ref >60)
GFR calc non Af Amer: >60 mL/min (ref >60)
Glucose, Bld: 122 mg/dL — ABNORMAL HIGH (ref 70–99)
Potassium: 3.2 mmol/L — ABNORMAL LOW (ref 3.5–5.1)
Sodium: 139 mmol/L (ref 135–145)
Total Bilirubin: 0.3 mg/dL (ref 0.3–1.2)
Total Protein: 6.6 g/dL (ref 6.5–8.1)

## 2019-04-15 MED ORDER — HEPARIN SOD (PORK) LOCK FLUSH 100 UNIT/ML IV SOLN
500.0000 [IU] | Freq: Once | INTRAVENOUS | Status: AC
  Administered 2019-04-15: 500 [IU]
  Filled 2019-04-15: qty 5

## 2019-04-15 MED ORDER — GOSERELIN ACETATE 3.6 MG ~~LOC~~ IMPL
DRUG_IMPLANT | SUBCUTANEOUS | Status: AC
  Filled 2019-04-15: qty 3.6

## 2019-04-15 MED ORDER — NERATINIB MALEATE 40 MG PO TABS: ORAL_TABLET | ORAL | 2 refills | Status: DC

## 2019-04-15 MED ORDER — SODIUM CHLORIDE 0.9% FLUSH
10.0000 mL | Freq: Once | INTRAVENOUS | Status: AC
  Administered 2019-04-15: 10 mL
  Filled 2019-04-15: qty 10

## 2019-04-15 MED ORDER — GOSERELIN ACETATE 3.6 MG ~~LOC~~ IMPL
3.6000 mg | DRUG_IMPLANT | Freq: Once | SUBCUTANEOUS | Status: AC
  Administered 2019-04-15: 3.6 mg via SUBCUTANEOUS

## 2019-04-16 ENCOUNTER — Telehealth: Payer: Self-pay | Admitting: Hematology

## 2019-04-16 LAB — ESTRADIOL: Estradiol: <5 pg/mL

## 2019-04-16 NOTE — Telephone Encounter (Signed)
Scheduled appt per 5/28 los.  Spoke with patient and patient aware of appt date and time.

## 2019-04-18 ENCOUNTER — Other Ambulatory Visit: Payer: Self-pay | Admitting: Hematology

## 2019-04-18 DIAGNOSIS — C50411 Malignant neoplasm of upper-outer quadrant of right female breast: Secondary | ICD-10-CM

## 2019-04-18 DIAGNOSIS — Z170 Estrogen receptor positive status [ER+]: Secondary | ICD-10-CM

## 2019-04-18 MED ORDER — POTASSIUM CHLORIDE CRYS ER 20 MEQ PO TBCR: 20.0000 meq | EXTENDED_RELEASE_TABLET | Freq: Every day | ORAL | 1 refills | Status: DC

## 2019-04-18 NOTE — Progress Notes (Unsigned)
referral

## 2019-04-19 ENCOUNTER — Inpatient Hospital Stay: Payer: BC Managed Care – PPO | Admitting: Nurse Practitioner

## 2019-04-19 ENCOUNTER — Telehealth: Payer: Self-pay

## 2019-04-19 NOTE — Telephone Encounter (Signed)
-----   Message from [name], MD sent at 04/18/2019 11:08 PM EDT ----- Malachy Mood, please let pt know her lab results, estradiol level is in postmenopausal range. I will refer [facility] to [facility] if she agrees (I will put referral order, please give her their office number too), for her postmenopausal vaginal bleeding, and future BSO. Her K is low, I will call in KCL 72meq daily, thanks   [name]  04/18/2019

## 2019-04-19 NOTE — Telephone Encounter (Signed)
Spoke with patient regarding lab results.  Per Dr. Burr Medico Estradiol level is in postmenopausal range.  She would like to refer her to [facility], she agreed to plan.  She as given their phone number as well.  Also her potassium is a little low, Dr. Burr Medico has sent in KCL 20 meq to take one daily.  She verbalized an understanding.

## 2019-04-23 ENCOUNTER — Other Ambulatory Visit: Payer: BLUE CROSS/BLUE SHIELD

## 2019-04-23 ENCOUNTER — Ambulatory Visit: Payer: BLUE CROSS/BLUE SHIELD | Admitting: Hematology

## 2019-04-23 ENCOUNTER — Ambulatory Visit: Payer: BLUE CROSS/BLUE SHIELD

## 2019-04-26 ENCOUNTER — Ambulatory Visit: Payer: BLUE CROSS/BLUE SHIELD | Admitting: Nurse Practitioner

## 2019-04-26 ENCOUNTER — Telehealth: Payer: Self-pay | Admitting: *Deleted

## 2019-04-26 NOTE — Telephone Encounter (Signed)
MR faxed to [employer]) - Release:  [id number]

## 2019-04-28 NOTE — Progress Notes (Signed)
[place]   [phone] [phone]) [phone]   Clinic Follow up Note   Patient Care Team: [name], MD as PCP - General (Family Medicine) [name] (Family Medicine) [name], MD as Consulting Physician (General Surgery) [name], MD as Consulting Physician (Hematology) Kyung Rudd, MD as Consulting Physician (Radiation Oncology) 04/29/2019   I connected with Suan Halter on 29/93/71 at 1:25 PM EDT by telephone visit and verified that I am speaking with the correct person using two identifiers.   I discussed the limitations, risks, security and privacy concerns of performing an evaluation and management service by telemedicine and the availability of in-person appointments. I also discussed with the patient that there may be a patient responsible charge related to this service. The patient expressed understanding and agreed to proceed.   Other persons participating in the visit and their role in the encounter: none  Patients location: patient's home Providers location: [place] office   CHIEF COMPLAINT: f/u breast cancer    SUMMARY OF ONCOLOGIC HISTORY: Oncology History Overview Note   Cancer Staging Malignant neoplasm of upper-outer quadrant of right breast in female, estrogen receptor positive ([place]) Staging form: Breast, AJCC 8th Edition - Clinical stage from 06/11/2017: Stage IB (cT3, cN0, cM0, G2, ER: Positive, PR: Positive, HER2: Positive) - Signed by [name], MD on 06/16/2017 - Pathologic stage from 07/22/2017: Stage IB (pT3(m), pN2a(sn), cM0, G2, ER: Positive, PR: Positive, HER2: Positive) - Signed by [name], NP on 08/04/2017    Malignant neoplasm of upper-outer quadrant of right breast in female, estrogen receptor positive ([place])  06/11/2017 Initial Biopsy   Diagnosis 06/11/17 Breast, right, needle core biopsy, 11:00 o'clock - INVASIVE DUCTAL CARCINOMA, G2   06/11/2017 Mammogram   [name] and [facility] Diagnostic Breast  Tomo Bilateral 06/11/17 IMPRESSION: 1. Highly suspicious mass within the retroareolar right breast, extending from the 8:00 to 11:00 axes, anterior to posterior depth, extending anteriorly to the nipple with associated nipple retraction, measuring at least 5.5 cm by ultrasound  2. No sonographic evidence of metastatic lymphadenopathy in the right axilla. 3. No evidence of malignancy within the left breast.    06/11/2017 Initial Diagnosis   Malignant neoplasm of upper-outer quadrant of right breast in female, estrogen receptor positive ([place])   06/11/2017 Receptors her2   Estrogen Receptor: 100%, POSITIVE, STRONG STAINING INTENSITY Progesterone Receptor: 100%, POSITIVE, STRONG STAINING INTENSITY Proliferation Marker Ki67: 12%Proliferation Marker Ki67: 12%  HER2 - **POSITIVE** RATIO OF HER2/CEP17 SIGNALS 2.55 AVERAGE HER2 COPY NUMBER PER CELL 4.85   06/25/2017 Genetic Testing   Negative genetic testing on the 9 gene STAT panel.  The STAT Breast cancer panel offered by [facility] includes sequencing and rearrangement analysis for the following 9 genes:  ATM, BRCA1, BRCA2, CDH1, CHEK2, PALB2, PTEN, STK11 and TP53.   The report date is June 25, 2017.  Negative genetic testing on the common hereditary cancer panel.  The Hereditary Gene Panel offered by [facility] includes sequencing and/or deletion duplication testing of the following 46 genes: APC, ATM, AXIN2, BARD1, BMPR1A, BRCA1, BRCA2, BRIP1, CDH1, CDKN2A (p14ARF), CDKN2A (p16INK4a), CHEK2, CTNNA1, DICER1, EPCAM (Deletion/duplication testing only), GREM1 (promoter region deletion/duplication testing only), KIT, MEN1, MLH1, MSH2, MSH3, MSH6, MUTYH, NBN, NF1, NHTL1, PALB2, PDGFRA, PMS2, POLD1, POLE, PTEN, RAD50, RAD51C, RAD51D, SDHB, SDHC, SDHD, SMAD4, SMARCA4. STK11, TP53, TSC1, TSC2, and VHL.  The following genes were evaluated for sequence changes only: SDHA and HOXB13 c.251G>A variant only.  The report date is June 25, 2017.  07/02/2017 Imaging    CT cAP 07/02/17 IMPRESSION: 1. Subareolar right breast mass. No compelling findings of nodal or metastatic involvement. 2. Old granulomatous disease. 3. There is a 7 mm enhancing or hyperdense focus posteriorly in segment 7 of the liver in the subcapsular region. By virtue of its small size this lesion is technically nonspecific although statistically likely to be a small benign lesions such as flash filling hemangioma. This may warrant surveillance. 4.  Prominent stool throughout the colon favors constipation. 5. Degenerative disc disease and spondylosis at L5-S1 likely causing mild impingement.   07/02/2017 Imaging   Bone scan 07/02/17 IMPRESSION: Today' s exam is negative. I ascribed the tiny focus of activity just proximal to the left antecubital region to injection site.   07/22/2017 Surgery   RIGHT TOTAL MASTECTOMY WITH RIGHT AXILLARY SENTINEL LYMPH NODE BIOPSY, LEFT PROPHYLACTIC MASTECTOM and INSERTION PORT-A-CATH WITH [name] by [name] and [facility]    07/22/2017 Pathology Results   Diagnosis 1. Breast, simple mastectomy, Left - FIBROADENOMA. - NO MALIGNANCY IDENTIFIED. 2. 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 - ADDITIONAL FOCUS OF LOBULAR CARCINOMA, GRADE 2, SPANNING 2.2 CM. - INTERMEDIATE GRADE DUCTAL CARCINOMA IN SITU. - INVASIVE CARCINOMA COMES TO WITHIN 0.2 TO 0.3 CM OF THE DEEP MARGIN, FOCALLY. - TUMOR FOCALLY INVOLVES EPIDERMIS. - LYMPHOVASCULAR INVASION OF DERMAL LYMPHATICS. - ONE OF ONE LYMPH NODES NEGATIVE FOR CARCINOMA (0/1). - SEE ONCOLOGY TABLE. 3. Lymph node, sentinel, biopsy, Right axillary - ONE LYMPH NODE WITH ISOLATED TUMOR CELLS (0/1). 4. Lymph node, sentinel, biopsy, Right - METASTATIC CARCINOMA IN ONE OF ONE LYMPH NODES (1/1). 5. Lymph node, sentinel, biopsy, Right - ONE OF ONE LYMPH NODES NEGATIVE FOR CARCINOMA (0/1). 6. Lymph node, sentinel, biopsy, Right - METASTATIC CARCINOMA  IN ONE OF ONE LYMPH NODES (1/1). - EXTRACAPSULAR EXTENSION. 7. Lymph node, sentinel, biopsy, Right - METASTATIC CARCINOMA IN ONE OF ONE LYMPH NODES (1/1). - EXTRACAPSULAR EXTENSION. 8. Lymph node, sentinel, biopsy, Right - ONE OF ONE LYMPH NODES NEGATIVE FOR CARCINOMA (0/1). 9. Lymph node, sentinel, biopsy, Right - ONE OF ONE LYMPH NODES NEGATIVE FOR CARCINOMA (0/1). 10. Lymph node, sentinel, biopsy, Right - METASTATIC CARCINOMA IN ONE OF ONE LYMPH NODES (1/1). - EXTRACAPSULAR EXTENSION. 11. Lymph node, biopsy, Right axillary - ONE OF ONE LYMPH NODES NEGATIVE FOR CARCINOMA (0/1).    07/22/2017 Receptors her2   Ductal carcinoma in primary breast tumor and node metastasis are ER 95%, PR 95% strongly positive, and HER2 + The lobular component in primary breast tumor is ER 95% positive, PR 95% positive, strong staining, HER-2 negative.   Original biopsy [id number]: Ductal morphology, Her2 FISH positive. Current specimen: Main focus is 75% ductal morphology, 25% lobular morphology. Smaller focus is lobular morphology. #2A Lobular morphology, Her IHC negative. #2E Lobular morphology, Her2 FISH negative, Her2 IHC negative. #4 50% lobular and 50% ductal morphology, Her2 FISH positive, Her2 IHC negative. #6 Ductal morphology #7 Ductal morphology #10 50% lobular, 50% ductal morphology   08/25/2017 Imaging   MRI abdomen [facility]:  Enhancing structure measing 5 mm is compatible with a flash filling hemangioma. No specific findings to suggest metastatic cancer   08/25/2017 Imaging   MRI Abdomen at [facility] 08/25/17  IMPRESSION: 1. Lesion in segment 7 of the liver has signal and enhancement characteristics compatible with a flash filling hemangioma. No specific findings identified to suggest metastatic disease.   08/29/2017 - 12/12/2017 Chemotherapy   adjuvant TCHP every  3 weeks for 6 cycles starting 08/29/17. Postponed and decreased Carbo to 6570m and added Onpro with cycle 2 on 09/19/17  due to neutropenic fever. Carbplatin further reduced to 5012mstarting with cycle 3 due to thrombocytopenia. Herceptin was held for Cycle 4 and 5 due to worsening global longitudinal strain found on ECHO from 11/07/17. Completed 6 cycles on 12/12/17      09/04/2017 - 09/09/2017 Hospital Admission   Admit date: 09/04/2017 Discharge date: 09/09/2017  DISCHARGE DIAGNOSES:  Principal Problem:   [facility]is ([facility] Problems:   Malignant neoplasm of upper-outer quadrant of right breast in female, estrogen receptor positive ([place] [place]  Port-A-Cath in place   Neutropenic fever ([facility]  Hyponatremia   Malignant neoplasm of breast in female, estrogen receptor positive ([facility]   12/28/2017 Imaging   CT CHEST IMPRESSION: 1. 2.3 x 2.7 x 1.2 cm irregular masslike opacity within the right middle lobe worrisome for malignancy/metastasis. 2. 3 new very small sclerotic lesions, within the sternum and the body of T4, worrisome for new metastases.   12/28/2017 - 01/05/2018 Hospital Admission   Admit date: 12/28/17-01/05/18 Admission diagnosis: multifocal pneumonia, influenza A, sepsis, hypoxia Additional comments: imaging during hospitalization notable for new irregular masslike opacity in the right middle lobe worrisome for metastasis and new very small sclerotic lesion within the sternum and the body of T4, worrisome for metastasis   12/29/2017 Imaging   CT ABD/PELVIS IMPRESSION: No definite acute intra-abdominal or intrapelvic abnormalities.  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   12/30/2017 Imaging   CT HEAD IMPRESSION: No acute intracranial abnormality or enhancing lesion.   01/16/2018 - 01/22/2019 Chemotherapy   Maintence Herceptin and Perjeta every 3 weeks for 6-12 months starting 01/16/18. Held 03/2018-07/17/18 due to low EF. Completed on 01/22/19    02/04/2018 PET scan   IMPRESSION: 1. The previous consolidation in the right middle lobe currently has a more  nodular and reticular appearance with some associated mild atelectasis along its anterior margin. This demonstrates low-grade metabolic activity with maximum SUV 3.2 (background blood pool activity is 2.7). Although conceivably related to low-grade residuum from the patient's malignancy, the presence of calcified right hilar lymph nodes and small calcifications along the nodularity raise suspicion for a low-grade active granulomatous process or atypical infectious process. Surveillance is recommended. 2. Scattered deposits of hypermetabolic but benign metabolically active brown fat.   02/23/2018 - 04/09/2018 Radiation Therapy   Radiation treatment dates:   02/23/2018 - 04/09/2018  Site/dose:   The patient initially received a dose of 50.4 Gy in 28 fractions to the right chest wall and supraclavicular region. This was delivered using a 3-D conformal, 4 field technique. The patient then received a boost to the mastectomy scar. This delivered an additional 10 Gy in 5 fractions using an en face electron field. The total dose was 60.4 Gy.  Narrative: The patient tolerated radiation treatment relatively well.   The patient had some expected skin irritation as she progressed during treatment. Moist desquamation was not present at the end of treatment.   03/20/2018 -  Anti-estrogen oral therapy   -Monthly Zoladex injection starting 03/20/18  -Letrozole 2.70m44mnce dialy starting 05/03/18  -Switched to Anastrozole starting 07/2018 Due to her worsening back pain.   04/06/2018 Imaging   CT Chest wo contrast IMPRESSION: 1. Stable linear nodular thickening in the RIGHT middle lobe is favored post infectious or inflammatory scarring. 2. Calcified RIGHT hilar lymph node is consistent post infectious process. 3.  Stable small LEFT upper lobe nodule. 4. Stable sclerotic lesion in the manubrium. Sclerotic lesion at T3 is more prominent (4 mm). Sclerotic lesion at [name] 1 is new from 07/02/2017.   04/06/2018  Imaging   04/06/2018 CT Chest IMPRESSION: 1. Stable linear nodular thickening in the RIGHT middle lobe is favored post infectious or inflammatory scarring. 2. Calcified RIGHT hilar lymph node is consistent post infectious process. 3. Stable small LEFT upper lobe nodule. 4. Stable sclerotic lesion in the manubrium. Sclerotic lesion at T3 is more prominent (4 mm). Sclerotic lesion at [name] 1 is new from 07/02/2017.   04/10/2018 Echocardiogram   LV EF 45%-50%   05/25/2018 Echocardiogram   LV EF 45%-50%   07/13/2018 Echocardiogram   Normal LV size with EF 55-60%   07/31/2018 Imaging   07/31/2018 Bone Scan IMPRESSION: No evidence of skeletal metastatic disease.   07/31/2018 Imaging   07/31/2018 CT CAP IMPRESSION: Residual linear and nodular peribronchovascular opacities in the right middle lobe, with other scattered areas of ground-glass opacities in the right middle, right upper and right lower lobes, which are located within the previously demonstrated bulky airspace consolidation. The findings are improved from the chest CT dated 12/30/2017, and relatively stable from PET-CT dated 02/04/2018. These may represent post infectious/post inflammatory changes or residual malignancy.  Stable partially calcified right hilar lymphadenopathy.  Interval development of nodular soft tissue thickening within the anterior mediastinum in the area of the thymus, new from chest CT dated 12/30/2017. This may represent reactive thymic tissue, thymoma or lymphadenopathy.  1.8 cm probably physiologic left ovarian cyst. Borderline thickening of the endometrium measuring 15.6 mm. Further evaluation with pelvic ultrasound may be considered.  No new or suspicious findings within the abdomen.   09/15/2018 Echocardiogram   EF 55-60%   01/28/2019 Imaging   MRI Lumbar spie 01/28/19  IMPRESSION: 1. Subcentimeter lesions in the L1 and L2 vertebral bodies, indeterminate for metastases. 2. Mild lower  lumbar disc degeneration without stenosis.   01/28/2019 Imaging   CT chest 01/28/19  IMPRESSION: Right lung post radiation changes. No active lung disease or other acute findings.  No evidence of recurrent or metastatic carcinoma within the thorax.     CURRENT THERAPY:  -Monthly Zoladex injections starting 03/20/18  -Letrozole 2.47m once daily starting 05/03/18, changed to anastrozole in 07/2018 -Neratinib 240 mg daily, starting 03/15/19  INTERVAL HISTORY: Ms. LChristianis doing OK. She missed neratinib dose on 6/8 AM and took it late in the night, then woke up 6/9 and took regular morning dose. The short interval caused her to have increased diarrhea on 6/9 and 6/10, 3-5 episodes per day. Did not improve much with lomotil. She has not had BM yet today however. Nausea also increased this week but controlled with compazine. No vomiting. Appetite is fair. She is drinking adequately, mouth remains dry. Vaginal bleeding stopped on 5/29, and only bled when she went to bathroom. She does not have appt yet with [facility]. Hot flashes are intermittent and stable. Back pain is bothering her more. She had some soreness during chemo, then again on letrozole. She stopped that and back pain improved, then switched to anastrozole and pain has increased over time. Pain is moderate to severe at times limiting activity. Flexeril helps. Otherwise, she denies fever, chills, cough, chest pain, dyspnea.    MEDICAL HISTORY:  Past Medical History:  Diagnosis Date   Anxiety    Cancer ([facility]    right breast    [facility] [place]  Family history of breast cancer    Family history of colon cancer    GERD (gastroesophageal reflux disease)    Headache    couple of migraines in the past   History of kidney stones    Vitamin D deficiency     SURGICAL HISTORY: Past Surgical History:  Procedure Laterality Date   egg donation     kidney stone removal     MASTECTOMY W/ SENTINEL NODE BIOPSY  Bilateral 07/22/2017   Procedure: RIGHT TOTAL MASTECTOMY WITH RIGHT AXILLARY SENTINEL LYMPH NODE BIOPSY, LEFT PROPHYLACTIC MASTECTOMY;  Surgeon: [name], MD;  Location: [place];  Service: General;  Laterality: Bilateral;   PORTACATH PLACEMENT Right 07/22/2017   Procedure: INSERTION PORT-A-CATH WITH [name];  Surgeon: [name], MD;  Location: [place];  Service: General;  Laterality: Right;    I have reviewed the social history and family history with the patient and they are unchanged from previous note.  ALLERGIES:  has No Known Allergies.  MEDICATIONS:  Current Outpatient Medications  Medication Sig Dispense Refill   anastrozole (ARIMIDEX) 1 MG tablet Take 1 tablet (1 mg total) by mouth daily. 30 tablet 11   B Complex-C (B-COMPLEX WITH VITAMIN C) tablet Take 1 tablet by mouth daily.     Biotin 1000 MCG tablet Take 1,000 mcg by mouth 3 (three) times daily.     Budesonide ER 9 MG TB24 TAKE 1 TABLET (9 MG) BY MOUTH DAILY. ALONG WITH NERATINIB FOR THE FIRST 1-2 MONTHS 30 tablet 2   carvedilol (COREG) 3.125 MG tablet Take 1 tablet (3.125 mg total) by mouth 2 (two) times daily. 180 tablet 3   Cholecalciferol (VITAMIN D) 2000 units tablet Take 2,000 Units by mouth daily.     cyclobenzaprine (FLEXERIL) 5 MG tablet Take 1 tablet (5 mg total) by mouth 2 (two) times daily as needed for muscle spasms. 30 tablet 0   diphenoxylate-atropine (LOMOTIL) 2.5-0.025 MG tablet Take 1-2 tablets by mouth 4 (four) times daily as needed for diarrhea or loose stools. 30 tablet 3   levothyroxine (SYNTHROID, LEVOTHROID) 25 MCG tablet Take 25 mcg by mouth daily before breakfast.     loperamide (IMODIUM) 2 MG capsule Take 1-2 capsules (2-4 mg total) by mouth 3 (three) times daily as needed for diarrhea or loose stools. 120 capsule 0   losartan (COZAAR) 25 MG tablet Take 0.5 tablets (12.5 mg total) by mouth at bedtime. 15 tablet 5   Melatonin 10 MG TABS Take 0.5 tablets by mouth.      naproxen sodium  (ANAPROX) 220 MG tablet Take 220 mg 2 (two) times daily as needed by mouth (pain).      Neratinib Maleate (NERLYNX) 40 MG tablet Take 6 tabs (230m) once daily. Take w food 180 tablet 2   potassium chloride SA (K-DUR) 20 MEQ tablet Take 1 tablet (20 mEq total) by mouth daily. 30 tablet 1   prochlorperazine (COMPAZINE) 10 MG tablet Take 1 tablet (10 mg total) by mouth every 6 (six) hours as needed (Nausea or vomiting). 45 tablet 3   venlafaxine XR (EFFEXOR-XR) 75 MG 24 hr capsule Take 3 capsules (225 mg total) by mouth daily with breakfast. 90 capsule 5   Current Facility-Administered Medications  Medication Dose Route Frequency Provider Last Rate Last Dose   gi cocktail (Maalox,Lidocaine,Donnatal)  30 mL Oral Once THarle Stanford, PA-C        PHYSICAL EXAMINATION: Patient appears well over the phone. Speech is normal, non-pressured   LABORATORY DATA:  None for this visit   RADIOGRAPHIC STUDIES: None for this visit   ASSESSMENT & PLAN: Akyla Vavrek Lichtis a 43 y.o.femalewith   1. Malignant neoplasm of upper-outer quadrant of right breast in female, mixed invasive ductal and lobular carcinoma, pT3(m)N2aM0, stage 1b, ductal carcinoma triple positive, lobular carcinoma ER+/PR+/HER2-, Grade 2 2. Diarrhea secondary to Neratinib  3. Insomnia  4. Hot flash- stable 5. Bone Health 6. DiffuseBack pain, knee joint pain - worsening 7. Lung lesions -seen on CT 12/2017 felt to be related to infection; nearly resolved on repeat CT 01/2019 8. Vaginal bleeding on zoladex, estradiol <5.0 confirming postmenopausal    Ms. Masley is doing well. She continues daily Neratinib, anastrozole, and monthly Zoladex. She took neratinib doses close together this week and GI symptoms increased, now back to baseline. Symptoms usually well controlled on lomotil and compazine.   Vaginal bleeding stopped after 4 days. Labs confirmed she is postmenopausal. She received her last zoladex inj 1 week early on  04/15/19. She has been referred to GYN at [facility] for postmenopausal bleeding and BSO, no appointment yet, I'll try to facilitate.   Her back pain has worsened and interferes with activity and mobility. Flexeril helps, which suggests muscular component. I suggest to hold AI for 2 weeks, until next f/u with Dr. Burr Medico. If pain improves off AI, she can discuss possibly changing to something such as exemestane, or re-challenge anastrozole. She tried letrozole in the past. If pain does not improve, this suggests other origin and I would recommend to restart anastrozole. She may need ortho referral. She agrees with the plan. She will return in 2 weeks for lab, f/u with Dr. Burr Medico, and zoladex injection.   PLAN: -Reviewed missed dose guidelines  -Hold anastrozole for 2 weeks for back pain -Sent schedule message re: GYN referral  -Continue Neratinib -F/u and zoladex injection in 2 weeks   All questions were answered. The patient knows to call the clinic with any problems, questions or concerns. No barriers to learning was detected. I provided 10 minutes of non face-to-face telephone visit time during this encounter, and > 50% was spent counseling as documented under my assessment & plan.    [name], NP 04/29/19

## 2019-04-29 ENCOUNTER — Inpatient Hospital Stay: Payer: BC Managed Care – PPO | Attending: Nurse Practitioner | Admitting: Nurse Practitioner

## 2019-04-29 ENCOUNTER — Encounter: Payer: Self-pay | Admitting: Nurse Practitioner

## 2019-04-29 DIAGNOSIS — Z79811 Long term (current) use of aromatase inhibitors: Secondary | ICD-10-CM

## 2019-04-29 DIAGNOSIS — R232 Flushing: Secondary | ICD-10-CM | POA: Insufficient documentation

## 2019-04-29 DIAGNOSIS — C50411 Malignant neoplasm of upper-outer quadrant of right female breast: Secondary | ICD-10-CM | POA: Insufficient documentation

## 2019-04-29 DIAGNOSIS — Z923 Personal history of irradiation: Secondary | ICD-10-CM | POA: Insufficient documentation

## 2019-04-29 DIAGNOSIS — G4700 Insomnia, unspecified: Secondary | ICD-10-CM | POA: Insufficient documentation

## 2019-04-29 DIAGNOSIS — Z79899 Other long term (current) drug therapy: Secondary | ICD-10-CM | POA: Insufficient documentation

## 2019-04-29 DIAGNOSIS — Z170 Estrogen receptor positive status [ER+]: Secondary | ICD-10-CM | POA: Diagnosis not present

## 2019-04-29 DIAGNOSIS — M549 Dorsalgia, unspecified: Secondary | ICD-10-CM | POA: Insufficient documentation

## 2019-04-29 DIAGNOSIS — Z9221 Personal history of antineoplastic chemotherapy: Secondary | ICD-10-CM | POA: Diagnosis not present

## 2019-04-29 DIAGNOSIS — R918 Other nonspecific abnormal finding of lung field: Secondary | ICD-10-CM | POA: Insufficient documentation

## 2019-04-30 ENCOUNTER — Telehealth: Payer: Self-pay | Admitting: Nurse Practitioner

## 2019-04-30 ENCOUNTER — Telehealth: Payer: Self-pay | Admitting: Hematology

## 2019-04-30 NOTE — Telephone Encounter (Signed)
Referral to [facility] sent via [facility]. Patient aware and will f/u with office if she does not hear from them. Not other orders per 6/11 schedule message.

## 2019-04-30 NOTE — Telephone Encounter (Signed)
No los per 6/11 

## 2019-05-07 NOTE — Progress Notes (Signed)
[place]   [phone] Fax:([phone]) [phone]   Clinic Follow up Note   Patient Care Team: [name], MD as PCP - General (Family Medicine) [name] (Family Medicine) [name], MD as Consulting Physician (General Surgery) [name], MD as Consulting Physician (Hematology) Kyung Rudd, MD as Consulting Physician (Radiation Oncology)  Date of Service:  05/13/2019  CHIEF COMPLAINT: F/u of right breast cancerand menstrual period  SUMMARY OF ONCOLOGIC HISTORY: Oncology History Overview Note   Cancer Staging Malignant neoplasm of upper-outer quadrant of right breast in female, estrogen receptor positive ([place]) Staging form: Breast, AJCC 8th Edition - Clinical stage from 06/11/2017: Stage IB (cT3, cN0, cM0, G2, ER: Positive, PR: Positive, HER2: Positive) - Signed by [name], MD on 06/16/2017 - Pathologic stage from 07/22/2017: Stage IB (pT3(m), pN2a(sn), cM0, G2, ER: Positive, PR: Positive, HER2: Positive) - Signed by [name], NP on 08/04/2017    Malignant neoplasm of upper-outer quadrant of right breast in female, estrogen receptor positive ([place])  06/11/2017 Initial Biopsy   Diagnosis 06/11/17 Breast, right, needle core biopsy, 11:00 o'clock - INVASIVE DUCTAL CARCINOMA, G2   06/11/2017 Mammogram   [name] and MM Diagnostic Breast Tomo Bilateral 06/11/17 IMPRESSION: 1. Highly suspicious mass within the retroareolar right breast, extending from the 8:00 to 11:00 axes, anterior to posterior depth, extending anteriorly to the nipple with associated nipple retraction, measuring at least 5.5 cm by ultrasound  2. No sonographic evidence of metastatic lymphadenopathy in the right axilla. 3. No evidence of malignancy within the left breast.    06/11/2017 Initial Diagnosis   Malignant neoplasm of upper-outer quadrant of right breast in female, estrogen receptor positive ([place])   06/11/2017 Receptors her2   Estrogen Receptor: 100%, POSITIVE,  STRONG STAINING INTENSITY Progesterone Receptor: 100%, POSITIVE, STRONG STAINING INTENSITY Proliferation Marker Ki67: 12%Proliferation Marker Ki67: 12%  HER2 - **POSITIVE** RATIO OF HER2/CEP17 SIGNALS 2.55 AVERAGE HER2 COPY NUMBER PER CELL 4.85   06/25/2017 Genetic Testing   Negative genetic testing on the 9 gene STAT panel.  The STAT Breast cancer panel offered by [facility] includes sequencing and rearrangement analysis for the following 9 genes:  ATM, BRCA1, BRCA2, CDH1, CHEK2, PALB2, PTEN, STK11 and TP53.   The report date is June 25, 2017.  Negative genetic testing on the common hereditary cancer panel.  The Hereditary Gene Panel offered by [facility] includes sequencing and/or deletion duplication testing of the following 46 genes: APC, ATM, AXIN2, BARD1, BMPR1A, BRCA1, BRCA2, BRIP1, CDH1, CDKN2A (p14ARF), CDKN2A (p16INK4a), CHEK2, CTNNA1, DICER1, EPCAM (Deletion/duplication testing only), GREM1 (promoter region deletion/duplication testing only), KIT, MEN1, MLH1, MSH2, MSH3, MSH6, MUTYH, NBN, NF1, NHTL1, PALB2, PDGFRA, PMS2, POLD1, POLE, PTEN, RAD50, RAD51C, RAD51D, SDHB, SDHC, SDHD, SMAD4, SMARCA4. STK11, TP53, TSC1, TSC2, and VHL.  The following genes were evaluated for sequence changes only: SDHA and HOXB13 c.251G>A variant only.  The report date is June 25, 2017.    07/02/2017 Imaging   CT cAP 07/02/17 IMPRESSION: 1. Subareolar right breast mass. No compelling findings of nodal or metastatic involvement. 2. Old granulomatous disease. 3. There is a 7 mm enhancing or hyperdense focus posteriorly in segment 7 of the liver in the subcapsular region. By virtue of its small size this lesion is technically nonspecific although statistically likely to be a small benign lesions such as flash filling hemangioma. This may warrant surveillance. 4.  Prominent stool throughout the colon favors constipation. 5. Degenerative disc disease and spondylosis at L5-S1 likely causing mild impingement.  07/02/2017 Imaging   Bone scan 07/02/17 IMPRESSION: Today' s exam is negative. I ascribed the tiny focus of activity just proximal to the left antecubital region to injection site.   07/22/2017 Surgery   RIGHT TOTAL MASTECTOMY WITH RIGHT AXILLARY SENTINEL LYMPH NODE BIOPSY, LEFT PROPHYLACTIC MASTECTOM and INSERTION PORT-A-CATH WITH [name] by [name] and [facility]    07/22/2017 Pathology Results   Diagnosis 1. Breast, simple mastectomy, Left - FIBROADENOMA. - NO MALIGNANCY IDENTIFIED. 2. 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 - ADDITIONAL FOCUS OF LOBULAR CARCINOMA, GRADE 2, SPANNING 2.2 CM. - INTERMEDIATE GRADE DUCTAL CARCINOMA IN SITU. - INVASIVE CARCINOMA COMES TO WITHIN 0.2 TO 0.3 CM OF THE DEEP MARGIN, FOCALLY. - TUMOR FOCALLY INVOLVES EPIDERMIS. - LYMPHOVASCULAR INVASION OF DERMAL LYMPHATICS. - ONE OF ONE LYMPH NODES NEGATIVE FOR CARCINOMA (0/1). - SEE ONCOLOGY TABLE. 3. Lymph node, sentinel, biopsy, Right axillary - ONE LYMPH NODE WITH ISOLATED TUMOR CELLS (0/1). 4. Lymph node, sentinel, biopsy, Right - METASTATIC CARCINOMA IN ONE OF ONE LYMPH NODES (1/1). 5. Lymph node, sentinel, biopsy, Right - ONE OF ONE LYMPH NODES NEGATIVE FOR CARCINOMA (0/1). 6. Lymph node, sentinel, biopsy, Right - METASTATIC CARCINOMA IN ONE OF ONE LYMPH NODES (1/1). - EXTRACAPSULAR EXTENSION. 7. Lymph node, sentinel, biopsy, Right - METASTATIC CARCINOMA IN ONE OF ONE LYMPH NODES (1/1). - EXTRACAPSULAR EXTENSION. 8. Lymph node, sentinel, biopsy, Right - ONE OF ONE LYMPH NODES NEGATIVE FOR CARCINOMA (0/1). 9. Lymph node, sentinel, biopsy, Right - ONE OF ONE LYMPH NODES NEGATIVE FOR CARCINOMA (0/1). 10. Lymph node, sentinel, biopsy, Right - METASTATIC CARCINOMA IN ONE OF ONE LYMPH NODES (1/1). - EXTRACAPSULAR EXTENSION. 11. Lymph node, biopsy, Right axillary - ONE OF ONE LYMPH NODES NEGATIVE FOR CARCINOMA (0/1).    07/22/2017  Receptors her2   Ductal carcinoma in primary breast tumor and node metastasis are ER 95%, PR 95% strongly positive, and HER2 + The lobular component in primary breast tumor is ER 95% positive, PR 95% positive, strong staining, HER-2 negative.   Original biopsy [id number]: Ductal morphology, Her2 FISH positive. Current specimen: Main focus is 75% ductal morphology, 25% lobular morphology. Smaller focus is lobular morphology. #2A Lobular morphology, Her IHC negative. #2E Lobular morphology, Her2 FISH negative, Her2 IHC negative. #4 50% lobular and 50% ductal morphology, Her2 FISH positive, Her2 IHC negative. #6 Ductal morphology #7 Ductal morphology #10 50% lobular, 50% ductal morphology   08/25/2017 Imaging   MRI abdomen done at [facility]:  Enhancing structure measing 5 mm is compatible with a flash filling hemangioma. No specific findings to suggest metastatic cancer   08/25/2017 Imaging   MRI Abdomen at [facility] 08/25/17  IMPRESSION: 1. Lesion in segment 7 of the liver has signal and enhancement characteristics compatible with a flash filling hemangioma. No specific findings identified to suggest metastatic disease.   08/29/2017 - 12/12/2017 Chemotherapy   adjuvant TCHP every 3 weeks for 6 cycles starting 08/29/17. Postponed and decreased Carbo to 649m and added Onpro with cycle 2 on 09/19/17 due to neutropenic fever. Carbplatin further reduced to 5074mstarting with cycle 3 due to thrombocytopenia. Herceptin was held for Cycle 4 and 5 due to worsening global longitudinal strain found on ECHO from 11/07/17. Completed 6 cycles on 12/12/17      09/04/2017 - 09/09/2017 Hospital Admission   Admit date: 09/04/2017 Discharge date: 09/09/2017  DISCHARGE DIAGNOSES:  Principal Problem:   [facility]:   Malignant neoplasm of upper-outer quadrant of  right breast in female, estrogen receptor positive ([place])   Port-A-Cath in place   Neutropenic fever ([place])   Hyponatremia    Malignant neoplasm of breast in female, estrogen receptor positive ([place])    12/28/2017 Imaging   CT CHEST IMPRESSION: 1. 2.3 x 2.7 x 1.2 cm irregular masslike opacity within the right middle lobe worrisome for malignancy/metastasis. 2. 3 new very small sclerotic lesions, within the sternum and the body of T4, worrisome for new metastases.   12/28/2017 - 01/05/2018 Hospital Admission   Admit date: 12/28/17-01/05/18 Admission diagnosis: multifocal pneumonia, influenza A, sepsis, hypoxia Additional comments: imaging during hospitalization notable for new irregular masslike opacity in the right middle lobe worrisome for metastasis and new very small sclerotic lesion within the sternum and the body of T4, worrisome for metastasis   12/29/2017 Imaging   CT ABD/PELVIS IMPRESSION: No definite acute intra-abdominal or intrapelvic abnormalities.  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   12/30/2017 Imaging   CT HEAD IMPRESSION: No acute intracranial abnormality or enhancing lesion.   01/16/2018 - 01/22/2019 Chemotherapy   Maintence Herceptin and Perjeta every 3 weeks for 6-12 months starting 01/16/18. Held 03/2018-07/17/18 due to low EF. Completed on 01/22/19    02/04/2018 PET scan   IMPRESSION: 1. The previous consolidation in the right middle lobe currently has a more nodular and reticular appearance with some associated mild atelectasis along its anterior margin. This demonstrates low-grade metabolic activity with maximum SUV 3.2 (background blood pool activity is 2.7). Although conceivably related to low-grade residuum from the patient's malignancy, the presence of calcified right hilar lymph nodes and small calcifications along the nodularity raise suspicion for a low-grade active granulomatous process or atypical infectious process. Surveillance is recommended. 2. Scattered deposits of hypermetabolic but benign metabolically active brown fat.   02/23/2018 -  04/09/2018 Radiation Therapy   Radiation treatment dates:   02/23/2018 - 04/09/2018  Site/dose:   The patient initially received a dose of 50.4 Gy in 28 fractions to the right chest wall and supraclavicular region. This was delivered using a 3-D conformal, 4 field technique. The patient then received a boost to the mastectomy scar. This delivered an additional 10 Gy in 5 fractions using an en face electron field. The total dose was 60.4 Gy.  Narrative: The patient tolerated radiation treatment relatively well.   The patient had some expected skin irritation as she progressed during treatment. Moist desquamation was not present at the end of treatment.   03/20/2018 -  Anti-estrogen oral therapy   -Monthly Zoladex injection starting 03/20/18  -Letrozole 2.25m once dialy starting 05/03/18  -Switched to Anastrozole starting 07/2018 Due to her worsening back pain.   04/06/2018 Imaging   CT Chest wo contrast IMPRESSION: 1. Stable linear nodular thickening in the RIGHT middle lobe is favored post infectious or inflammatory scarring. 2. Calcified RIGHT hilar lymph node is consistent post infectious process. 3. Stable small LEFT upper lobe nodule. 4. Stable sclerotic lesion in the manubrium. Sclerotic lesion at T3 is more prominent (4 mm). Sclerotic lesion at L 1 is new from 07/02/2017.   04/06/2018 Imaging   04/06/2018 CT Chest IMPRESSION: 1. Stable linear nodular thickening in the RIGHT middle lobe is favored post infectious or inflammatory scarring. 2. Calcified RIGHT hilar lymph node is consistent post infectious process. 3. Stable small LEFT upper lobe nodule. 4. Stable sclerotic lesion in the manubrium. Sclerotic lesion at T3 is more prominent (4 mm). Sclerotic lesion at L 1 is new  from 07/02/2017.   04/10/2018 Echocardiogram   LV EF 45%-50%   05/25/2018 Echocardiogram   LV EF 45%-50%   07/13/2018 Echocardiogram   Normal LV size with EF 55-60%   07/31/2018 Imaging   07/31/2018 Bone Scan  IMPRESSION: No evidence of skeletal metastatic disease.   07/31/2018 Imaging   07/31/2018 CT CAP IMPRESSION: Residual linear and nodular peribronchovascular opacities in the right middle lobe, with other scattered areas of ground-glass opacities in the right middle, right upper and right lower lobes, which are located within the previously demonstrated bulky airspace consolidation. The findings are improved from the chest CT dated 12/30/2017, and relatively stable from PET-CT dated 02/04/2018. These may represent post infectious/post inflammatory changes or residual malignancy.  Stable partially calcified right hilar lymphadenopathy.  Interval development of nodular soft tissue thickening within the anterior mediastinum in the area of the thymus, new from chest CT dated 12/30/2017. This may represent reactive thymic tissue, thymoma or lymphadenopathy.  1.8 cm probably physiologic left ovarian cyst. Borderline thickening of the endometrium measuring 15.6 mm. Further evaluation with pelvic ultrasound may be considered.  No new or suspicious findings within the abdomen.   09/15/2018 Echocardiogram   EF 55-60%   01/28/2019 Imaging   MRI Lumbar spie 01/28/19  IMPRESSION: 1. Subcentimeter lesions in the L1 and L2 vertebral bodies, indeterminate for metastases. 2. Mild lower lumbar disc degeneration without stenosis.   01/28/2019 Imaging   CT chest 01/28/19  IMPRESSION: Right lung post radiation changes. No active lung disease or other acute findings.  No evidence of recurrent or metastatic carcinoma within the thorax.      CURRENT THERAPY:  -Monthly Zoladex injections starting 03/20/18  -Letrozole 2.17m once daily starting 05/03/18, changed to anastrozole in 07/2018. Changed to Exemestane in 04/2019 due to back pain exacerbation.  -Neratinib 240 mg daily, starting 03/15/19  INTERVAL HISTORY:  ASmantha Boakyeis here for a follow up and injection. She presents to the  clinic alone. She notes she is still tolerating Neratinib well. She rarely has diarrhea now. She is still taking Budesonide. She tried to stop it but she had withdrawals symptoms after stopping for 3 days so she is still on it. She notes her vaginal bleeding has not returned. She is still trying to be seen by her Gyn.  She stopped anastrozole and her back pain improved back to 4/10. She is willing to try another AI before Tamoxifen.   REVIEW OF SYSTEMS:   Constitutional: Denies fevers, chills or abnormal weight loss Eyes: Denies blurriness of vision Ears, nose, mouth, throat, and face: Denies mucositis or sore throat Respiratory: Denies cough, dyspnea or wheezes Cardiovascular: Denies palpitation, chest discomfort or lower extremity swelling Gastrointestinal:  Denies nausea, heartburn or change in bowel habits Skin: Denies abnormal skin rashes MSK: (+) Back pain improved.  Lymphatics: Denies new lymphadenopathy or easy bruising Neurological:Denies numbness, tingling or new weaknesses Behavioral/Psych: Mood is stable, no new changes  All other systems were reviewed with the patient and are negative.  MEDICAL HISTORY:  Past Medical History:  Diagnosis Date  . Anxiety   . Cancer ([facility]    right breast   . Cardiomyopathy ([facility]   . Family history of breast cancer   . Family history of colon cancer   . GERD (gastroesophageal reflux disease)   . Headache    couple of migraines in the past  . History of kidney stones   . Vitamin D deficiency     SURGICAL HISTORY: Past Surgical History:  Procedure Laterality Date  . egg donation    . kidney stone removal    . MASTECTOMY W/ SENTINEL NODE BIOPSY Bilateral 07/22/2017   Procedure: RIGHT TOTAL MASTECTOMY WITH RIGHT AXILLARY SENTINEL LYMPH NODE BIOPSY, LEFT PROPHYLACTIC MASTECTOMY;  Surgeon: [name], MD;  Location: [place];  Service: General;  Laterality: Bilateral;  . PORTACATH PLACEMENT Right 07/22/2017   Procedure: INSERTION  PORT-A-CATH WITH [name];  Surgeon: [name], MD;  Location: [place];  Service: General;  Laterality: Right;    I have reviewed the social history and family history with the patient and they are unchanged from previous note.  ALLERGIES:  has No Known Allergies.  MEDICATIONS:  Current Outpatient Medications  Medication Sig Dispense Refill  . anastrozole (ARIMIDEX) 1 MG tablet Take 1 tablet (1 mg total) by mouth daily. 30 tablet 11  . B Complex-C (B-COMPLEX WITH VITAMIN C) tablet Take 1 tablet by mouth daily.    . Biotin 1000 MCG tablet Take 1,000 mcg by mouth 3 (three) times daily.    . budesonide (ENTOCORT EC) 3 MG 24 hr capsule Take 1 capsule (3 mg total) by mouth daily. 20 capsule 0  . carvedilol (COREG) 3.125 MG tablet Take 1 tablet (3.125 mg total) by mouth 2 (two) times daily. 180 tablet 3  . Cholecalciferol (VITAMIN D) 2000 units tablet Take 2,000 Units by mouth daily.    . cyclobenzaprine (FLEXERIL) 5 MG tablet Take 1 tablet (5 mg total) by mouth 2 (two) times daily as needed for muscle spasms. 30 tablet 0  . diphenoxylate-atropine (LOMOTIL) 2.5-0.025 MG tablet Take 1-2 tablets by mouth 4 (four) times daily as needed for diarrhea or loose stools. 30 tablet 3  . exemestane (AROMASIN) 25 MG tablet Take 1 tablet (25 mg total) by mouth daily after breakfast. 30 tablet 3  . levothyroxine (SYNTHROID, LEVOTHROID) 25 MCG tablet Take 25 mcg by mouth daily before breakfast.    . loperamide (IMODIUM) 2 MG capsule Take 1-2 capsules (2-4 mg total) by mouth 3 (three) times daily as needed for diarrhea or loose stools. 120 capsule 0  . losartan (COZAAR) 25 MG tablet Take 0.5 tablets (12.5 mg total) by mouth at bedtime. 15 tablet 5  . Melatonin 10 MG TABS Take 0.5 tablets by mouth.     . naproxen sodium (ANAPROX) 220 MG tablet Take 220 mg 2 (two) times daily as needed by mouth (pain).     . Neratinib Maleate (NERLYNX) 40 MG tablet Take 6 tabs (271m) once daily. Take w food 180 tablet 2  .  potassium chloride SA (K-DUR) 20 MEQ tablet Take 1 tablet (20 mEq total) by mouth daily. 30 tablet 1  . prochlorperazine (COMPAZINE) 10 MG tablet Take 1 tablet (10 mg total) by mouth every 6 (six) hours as needed (Nausea or vomiting). 45 tablet 3  . venlafaxine XR (EFFEXOR-XR) 75 MG 24 hr capsule Take 3 capsules (225 mg total) by mouth daily with breakfast. 90 capsule 5   Current Facility-Administered Medications  Medication Dose Route Frequency Provider Last Rate Last Dose  . gi cocktail (Maalox,Lidocaine,Donnatal)  30 mL Oral Once THarle Stanford, PA-C        PHYSICAL EXAMINATION: ECOG PERFORMANCE STATUS: 1 - Symptomatic but completely ambulatory  Vitals:   05/13/19 1406  BP: 101/61  Pulse: 95  Resp: 18  Temp: 97.6 F (36.4 C)  SpO2: 98%   Filed Weights   05/13/19 1406  Weight: 169 lb (76.7 kg)    GENERAL:alert, no distress  and comfortable SKIN: skin color, texture, turgor are normal, no rashes or significant lesions EYES: normal, Conjunctiva are pink and non-injected, sclera clear  NECK: supple, thyroid normal size, non-tender, without nodularity LYMPH:  no palpable lymphadenopathy in the cervical, axillary  LUNGS: clear to auscultation and percussion with normal breathing effort HEART: regular rate & rhythm and no murmurs and no lower extremity edema ABDOMEN:abdomen soft, non-tender and normal bowel sounds Musculoskeletal:no cyanosis of digits and no clubbing  NEURO: alert & oriented x 3 with fluent speech, no focal motor/sensory deficits  LABORATORY DATA:  I have reviewed the data as listed CBC Latest Ref Rng & Units 05/13/2019 04/15/2019 03/25/2019  WBC 4.0 - 10.5 K/uL 3.5(L) 4.4 4.7  Hemoglobin 12.0 - 15.0 g/dL 11.6(L) 10.7(L) 11.0(L)  Hematocrit 36.0 - 46.0 % 35.9(L) 33.0(L) 33.4(L)  Platelets 150 - 400 K/uL 189 163 178     CMP Latest Ref Rng & Units 05/13/2019 04/15/2019 03/25/2019  Glucose 70 - 99 mg/dL 69(L) 122(H) 106(H)  BUN 6 - 20 mg/dL _0 Creatinine 0.44  - 1.00 mg/dL 1.09(H) 1.08(H) 1.10(H)  Sodium 135 - 145 mmol/L 140 139 141  Potassium 3.5 - 5.1 mmol/L 4.2 3.2(L) 3.5  Chloride 98 - 111 mmol/L 109 106 107  CO2 22 - 32 mmol/L _1 Calcium 8.9 - 10.3 mg/dL 8.4(L) 8.4(L) 8.5(L)  Total Protein 6.5 - 8.1 g/dL 6.9 6.6 7.0  Total Bilirubin 0.3 - 1.2 mg/dL 0.2(L) 0.3 0.3  Alkaline Phos 38 - 126 U/L 80 87 100  AST 15 - 41 U/L 11(L) 10(L) 9(L)  ALT 0 - 44 U/L _2 RADIOGRAPHIC STUDIES: I have personally reviewed the radiological images as listed and agreed with the findings in the report. No results found.   ASSESSMENT & PLAN:  Sheila [name] is a 43 y.o. female with   1. Malignant neoplasm of upper-outer quadrant of right breast in female, mixed invasive ductal and lobular carcinoma, pT3(m)N2aM0, stage 1b, ductal carcinoma triple positive, lobular carcinoma ER+/PR+/HER2-, Grade 2 -She was diagnosed in 05/2017. She is s/pb/lmastectomy, adjuvant TCHP and radiationandmaintenanceHerceptin/Perjeta.  -Her genetic testing was negative. -She is currentlyon adjuvant anastrozole with monthlyZoladexinjections, tolerating well. -She has cancelled her BSO for now,and wants to have the surgerylater this year. Shewill continue Zoladex injectionsmonthly -She started Neratinib 2234m daily on 03/15/19. She is tolerating well with no more diarrhea. She has tried to stop Budesonide but had withdrawals when off for 3 days. I will refill to start 644mdaily for 5 days then 34m94maily for 5 days then off.  -Due to worsening back pain (to 8-9/10) she held Anastrozole for 2 weeks. Her back pain has improved to 4-5/10 off Anastrozole. I discussed switching to Tamoxifen as this causes least joint pain or she can try Exemestane. She opted to try Exemestane and continue Zoladex injections. I called in today  -Labs reviewed, CBC and CMP WNL except WBC 3.5, Hg 11.6, ANC 1.6, calcium 8.4. Will proceed with Zoladex injection today   2. Vaginal  bleeding   -Started 04/12/19 with cramping, light to moderate flow, fist time since she started Zoladex injections on 03/20/18. She has been on this for year.  -I checked her estradiol level was less than 5, in postmenopausal range.  -Her vaginal bleeding stopped after 4 days and has not recurred.  -We discussed other possibility of postmenopausal bleeding, may need a vaginal US.[place] is still trying to  find Gyn  to follow up with.  -She still plans to wait until end of 2020 to proceed with BSO  3. Insomnia  -She has history of insomnia failed multiple agents such as lunesta, trazodone, tylenol pm, and xanax and only had mild relief with Remeron. -She previously was able to ween off Ativan andAmbienwhich improved her sleep. -She currently takeshigh doseMelatonin to help her sleep, is not very effective. -Continues to improve.Not mentioned again today, likely resolved.  4. Hot flash -Now mild and tolerable. ContinueEffexor255m daily.  -Will monitor on exemestane   5. Bone Health -Continue Vitamin D supplement and Vitamin C rich food -We will order a bone density scan in 2020   6. DiffuseBack pain -Her CT Chest on 04/06/18 showed sclerotic lesions of spine in T3 and L1, buther bone scan in 07/2018 was negative  -Letrozole was changed to anastrozole in September 2019due to arthralgia -MRI lumbar from 01/28/19 shows lesions of L1 and L2indeterminate, will continue follow up -She will continueFlexeril and aleve for pain.  -Back pain did improve off Anastrozole. Will switch her to Exemestane (04/23/19)  7. Lung lesions  -She was found to have masslike opacity in the right middle lobe when she was admitted for neutropenic fever in February 2019, it got progressively worse during the hospital stay, felt to be related to pneumonia, on subsequent follow-up scan, this has much improved, but still has some residual nodule opacity and groundglass opacity in the right lung on the  last CT scan in September 2019. -CT Chestfrom 3/12/20showed overall much improved/near resolved, no concern for metastatic disease or newpulmonary nodules.I do not plan to repeat more CT chest for f/u.   PLAN: -I called in Exemestane today -I refilled Budesonide 372mpill today and she will wean it off in the next 10-14 days -Proceed with Zoladex today and continue monthly  -port flush every 2 months  -Continue Neratinib daily.  -F/u in 2 months    No problem-specific Assessment & Plan notes found for this encounter.   No orders of the defined types were placed in this encounter.  All questions were answered. The patient knows to call the clinic with any problems, questions or concerns. No barriers to learning was detected. I spent 20 minutes counseling the patient face to face. The total time spent in the appointment was 25 minutes and more than 50% was on counseling and review of test results     YaTruitt MerleMD 05/13/2019   I, AmJoslyn Devonam acting as scribe for YaTruitt MerleMD.   I have reviewed the above documentation for accuracy and completeness, and I agree with the above.

## 2019-05-11 ENCOUNTER — Other Ambulatory Visit: Payer: Self-pay | Admitting: Hematology

## 2019-05-13 ENCOUNTER — Other Ambulatory Visit: Payer: Self-pay

## 2019-05-13 ENCOUNTER — Inpatient Hospital Stay: Payer: BC Managed Care – PPO

## 2019-05-13 ENCOUNTER — Inpatient Hospital Stay (HOSPITAL_BASED_OUTPATIENT_CLINIC_OR_DEPARTMENT_OTHER): Payer: BC Managed Care – PPO | Admitting: Hematology

## 2019-05-13 ENCOUNTER — Encounter: Payer: Self-pay | Admitting: Hematology

## 2019-05-13 ENCOUNTER — Telehealth: Payer: Self-pay | Admitting: Hematology

## 2019-05-13 VITALS — BP 104/60 | HR 94 | Temp 97.6°F | Resp 18

## 2019-05-13 VITALS — BP 101/61 | HR 95 | Temp 97.6°F | Resp 18 | Ht 66.0 in | Wt 169.0 lb

## 2019-05-13 DIAGNOSIS — M549 Dorsalgia, unspecified: Secondary | ICD-10-CM | POA: Diagnosis not present

## 2019-05-13 DIAGNOSIS — Z923 Personal history of irradiation: Secondary | ICD-10-CM | POA: Diagnosis not present

## 2019-05-13 DIAGNOSIS — R232 Flushing: Secondary | ICD-10-CM | POA: Diagnosis not present

## 2019-05-13 DIAGNOSIS — C50411 Malignant neoplasm of upper-outer quadrant of right female breast: Secondary | ICD-10-CM

## 2019-05-13 DIAGNOSIS — G4700 Insomnia, unspecified: Secondary | ICD-10-CM

## 2019-05-13 DIAGNOSIS — Z170 Estrogen receptor positive status [ER+]: Secondary | ICD-10-CM | POA: Diagnosis not present

## 2019-05-13 DIAGNOSIS — Z79899 Other long term (current) drug therapy: Secondary | ICD-10-CM | POA: Diagnosis not present

## 2019-05-13 DIAGNOSIS — Z95828 Presence of other vascular implants and grafts: Secondary | ICD-10-CM

## 2019-05-13 DIAGNOSIS — Z79811 Long term (current) use of aromatase inhibitors: Secondary | ICD-10-CM

## 2019-05-13 DIAGNOSIS — R918 Other nonspecific abnormal finding of lung field: Secondary | ICD-10-CM

## 2019-05-13 LAB — CBC WITH DIFFERENTIAL/PLATELET
Abs Immature Granulocytes: 0.01 10*3/uL (ref 0.00–0.07)
Basophils Absolute: 0 10*3/uL (ref 0.0–0.1)
Basophils Relative: 1 %
Eosinophils Absolute: 0.1 10*3/uL (ref 0.0–0.5)
Eosinophils Relative: 1 %
HCT: 35.9 % — ABNORMAL LOW (ref 36.0–46.0)
Hemoglobin: 11.6 g/dL — ABNORMAL LOW (ref 12.0–15.0)
Immature Granulocytes: 0 %
Lymphocytes Relative: 40 %
Lymphs Abs: 1.4 10*3/uL (ref 0.7–4.0)
MCH: 30.4 pg (ref 26.0–34.0)
MCHC: 32.3 g/dL (ref 30.0–36.0)
MCV: 94.2 fL (ref 80.0–100.0)
Monocytes Absolute: 0.4 10*3/uL (ref 0.1–1.0)
Monocytes Relative: 11 %
Neutro Abs: 1.6 10*3/uL — ABNORMAL LOW (ref 1.7–7.7)
Neutrophils Relative %: 47 %
Platelets: 189 10*3/uL (ref 150–400)
RBC: 3.81 MIL/uL — ABNORMAL LOW (ref 3.87–5.11)
RDW: 13.4 % (ref 11.5–15.5)
WBC: 3.5 10*3/uL — ABNORMAL LOW (ref 4.0–10.5)
nRBC: 0 % (ref 0.0–0.2)

## 2019-05-13 LAB — COMPREHENSIVE METABOLIC PANEL
ALT: 16 U/L (ref 0–44)
AST: 11 U/L — ABNORMAL LOW (ref 15–41)
Albumin: 3.4 g/dL — ABNORMAL LOW (ref 3.5–5.0)
Alkaline Phosphatase: 80 U/L (ref 38–126)
Anion gap: 6 (ref 5–15)
BUN: 11 mg/dL (ref 6–20)
CO2: 25 mmol/L (ref 22–32)
Calcium: 8.4 mg/dL — ABNORMAL LOW (ref 8.9–10.3)
Chloride: 109 mmol/L (ref 98–111)
Creatinine, Ser: 1.09 mg/dL — ABNORMAL HIGH (ref 0.44–1.00)
GFR calc Af Amer: >60 mL/min (ref >60)
GFR calc non Af Amer: >60 mL/min (ref >60)
Glucose, Bld: 69 mg/dL — ABNORMAL LOW (ref 70–99)
Potassium: 4.2 mmol/L (ref 3.5–5.1)
Sodium: 140 mmol/L (ref 135–145)
Total Bilirubin: 0.2 mg/dL — ABNORMAL LOW (ref 0.3–1.2)
Total Protein: 6.9 g/dL (ref 6.5–8.1)

## 2019-05-13 MED ORDER — BUDESONIDE 3 MG PO CPEP: 3.0000 mg | ORAL_CAPSULE | Freq: Every day | ORAL | 0 refills | Status: DC

## 2019-05-13 MED ORDER — GOSERELIN ACETATE 3.6 MG ~~LOC~~ IMPL
3.6000 mg | DRUG_IMPLANT | Freq: Once | SUBCUTANEOUS | Status: AC
  Administered 2019-05-13: 3.6 mg via SUBCUTANEOUS

## 2019-05-13 MED ORDER — GOSERELIN ACETATE 3.6 MG ~~LOC~~ IMPL
DRUG_IMPLANT | SUBCUTANEOUS | Status: AC
  Filled 2019-05-13: qty 3.6

## 2019-05-13 MED ORDER — EXEMESTANE 25 MG PO TABS: 25.0000 mg | ORAL_TABLET | Freq: Every day | ORAL | 3 refills | Status: DC

## 2019-05-13 NOTE — Telephone Encounter (Signed)
Refill request

## 2019-05-13 NOTE — Patient Instructions (Signed)
Goserelin injection What is this medicine? GOSERELIN (GOE se rel in) is similar to a hormone found in the body. It lowers the amount of sex hormones that the body makes. Men will have lower testosterone levels and women will have lower estrogen levels while taking this medicine. In men, this medicine is used to treat prostate cancer; the injection is either given once per month or once every 12 weeks. A once per month injection (only) is used to treat women with endometriosis, dysfunctional uterine bleeding, or advanced breast cancer. This medicine may be used for other purposes; ask your health care provider or pharmacist if you have questions. COMMON BRAND NAME(S): Zoladex What should I tell my health care provider before I take this medicine? They need to know if you have any of these conditions (some only apply to women): -diabetes -heart disease or previous heart attack -high blood pressure -high cholesterol -kidney disease -osteoporosis or low bone density -problems passing urine -spinal cord injury -stroke -tobacco smoker -an unusual or allergic reaction to goserelin, hormone therapy, other medicines, foods, dyes, or preservatives -pregnant or trying to get pregnant -breast-feeding How should I use this medicine? This medicine is for injection under the skin. It is given by a health care professional in a hospital or clinic setting. Men receive this injection once every 4 weeks or once every 12 weeks. Women will only receive the once every 4 weeks injection. Talk to your pediatrician regarding the use of this medicine in children. Special care may be needed. Overdosage: If you think you have taken too much of this medicine contact a poison control center or emergency room at once. NOTE: This medicine is only for you. Do not share this medicine with others. What if I miss a dose? It is important not to miss your dose. Call your doctor or health care professional if you are unable to  keep an appointment. What may interact with this medicine? -female hormones like estrogen -herbal or dietary supplements like black cohosh, chasteberry, or DHEA -female hormones like testosterone -prasterone This list may not describe all possible interactions. Give your health care provider a list of all the medicines, herbs, non-prescription drugs, or dietary supplements you use. Also tell them if you smoke, drink alcohol, or use illegal drugs. Some items may interact with your medicine. What should I watch for while using this medicine? Visit your doctor or health care professional for regular checks on your progress. Your symptoms may appear to get worse during the first weeks of this therapy. Tell your doctor or healthcare professional if your symptoms do not start to get better or if they get worse after this time. Your bones may get weaker if you take this medicine for a long time. If you smoke or frequently drink alcohol you may increase your risk of bone loss. A family history of osteoporosis, chronic use of drugs for seizures (convulsions), or corticosteroids can also increase your risk of bone loss. Talk to your doctor about how to keep your bones strong. This medicine should stop regular monthly menstration in women. Tell your doctor if you continue to menstrate. Women should not become pregnant while taking this medicine or for 12 weeks after stopping this medicine. Women should inform their doctor if they wish to become pregnant or think they might be pregnant. There is a potential for serious side effects to an unborn child. Talk to your health care professional or pharmacist for more information. Do not breast-feed an infant while taking   this medicine. Men should inform their doctors if they wish to father a child. This medicine may lower sperm counts. Talk to your health care professional or pharmacist for more information. What side effects may I notice from receiving this  medicine? Side effects that you should report to your doctor or health care professional as soon as possible: -allergic reactions like skin rash, itching or hives, swelling of the face, lips, or tongue -bone pain -breathing problems -changes in vision -chest pain -feeling faint or lightheaded, falls -fever, chills -pain, swelling, warmth in the leg -pain, tingling, numbness in the hands or feet -signs and symptoms of low blood pressure like dizziness; feeling faint or lightheaded, falls; unusually weak or tired -stomach pain -swelling of the ankles, feet, hands -trouble passing urine or change in the amount of urine -unusually high or low blood pressure -unusually weak or tired Side effects that usually do not require medical attention (report to your doctor or health care professional if they continue or are bothersome): -change in sex drive or performance -changes in breast size in both males and females -changes in emotions or moods -headache -hot flashes -irritation at site where injected -loss of appetite -skin problems like acne, dry skin -vaginal dryness This list may not describe all possible side effects. Call your doctor for medical advice about side effects. You may report side effects to FDA at 1-800-FDA-1088. Where should I keep my medicine? This drug is given in a hospital or clinic and will not be stored at home. NOTE: This sheet is a summary. It may not cover all possible information. If you have questions about this medicine, talk to your doctor, pharmacist, or health care provider.  2019 Elsevier/Gold Standard (2014-01-11 11:10:35)  

## 2019-05-13 NOTE — Telephone Encounter (Signed)
Scheduled appt per 6/25 los. Spoke with patient and she is aware of her appt date and time.

## 2019-05-24 ENCOUNTER — Ambulatory Visit: Payer: BLUE CROSS/BLUE SHIELD

## 2019-05-24 ENCOUNTER — Other Ambulatory Visit: Payer: BLUE CROSS/BLUE SHIELD

## 2019-05-24 ENCOUNTER — Ambulatory Visit: Payer: BLUE CROSS/BLUE SHIELD | Admitting: Nurse Practitioner

## 2019-06-07 DIAGNOSIS — H60391 Other infective otitis externa, right ear: Secondary | ICD-10-CM | POA: Diagnosis not present

## 2019-06-07 DIAGNOSIS — H6121 Impacted cerumen, right ear: Secondary | ICD-10-CM | POA: Diagnosis not present

## 2019-06-10 ENCOUNTER — Inpatient Hospital Stay: Payer: BC Managed Care – PPO

## 2019-06-10 ENCOUNTER — Inpatient Hospital Stay: Payer: BC Managed Care – PPO | Attending: Nurse Practitioner

## 2019-06-10 ENCOUNTER — Other Ambulatory Visit: Payer: Self-pay

## 2019-06-10 VITALS — BP 128/62 | HR 87 | Temp 98.2°F | Resp 17

## 2019-06-10 DIAGNOSIS — Z9013 Acquired absence of bilateral breasts and nipples: Secondary | ICD-10-CM | POA: Diagnosis not present

## 2019-06-10 DIAGNOSIS — Z452 Encounter for adjustment and management of vascular access device: Secondary | ICD-10-CM | POA: Diagnosis not present

## 2019-06-10 DIAGNOSIS — Z95828 Presence of other vascular implants and grafts: Secondary | ICD-10-CM

## 2019-06-10 DIAGNOSIS — Z923 Personal history of irradiation: Secondary | ICD-10-CM | POA: Diagnosis not present

## 2019-06-10 DIAGNOSIS — Z79811 Long term (current) use of aromatase inhibitors: Secondary | ICD-10-CM | POA: Diagnosis not present

## 2019-06-10 DIAGNOSIS — C50411 Malignant neoplasm of upper-outer quadrant of right female breast: Secondary | ICD-10-CM | POA: Diagnosis not present

## 2019-06-10 DIAGNOSIS — Z170 Estrogen receptor positive status [ER+]: Secondary | ICD-10-CM | POA: Diagnosis not present

## 2019-06-10 DIAGNOSIS — Z9221 Personal history of antineoplastic chemotherapy: Secondary | ICD-10-CM | POA: Diagnosis not present

## 2019-06-10 MED ORDER — GOSERELIN ACETATE 3.6 MG ~~LOC~~ IMPL
3.6000 mg | DRUG_IMPLANT | Freq: Once | SUBCUTANEOUS | Status: AC
  Administered 2019-06-10: 3.6 mg via SUBCUTANEOUS

## 2019-06-10 MED ORDER — SODIUM CHLORIDE 0.9% FLUSH
10.0000 mL | Freq: Once | INTRAVENOUS | Status: AC
  Administered 2019-06-10: 10 mL
  Filled 2019-06-10: qty 10

## 2019-06-10 MED ORDER — HEPARIN SOD (PORK) LOCK FLUSH 100 UNIT/ML IV SOLN
500.0000 [IU] | Freq: Once | INTRAVENOUS | Status: AC
  Administered 2019-06-10: 500 [IU]
  Filled 2019-06-10: qty 5

## 2019-06-10 MED ORDER — GOSERELIN ACETATE 3.6 MG ~~LOC~~ IMPL
DRUG_IMPLANT | SUBCUTANEOUS | Status: AC
  Filled 2019-06-10: qty 3.6

## 2019-06-10 NOTE — Patient Instructions (Signed)
Goserelin injection What is this medicine? GOSERELIN (GOE se rel in) is similar to a hormone found in the body. It lowers the amount of sex hormones that the body makes. Men will have lower testosterone levels and women will have lower estrogen levels while taking this medicine. In men, this medicine is used to treat prostate cancer; the injection is either given once per month or once every 12 weeks. A once per month injection (only) is used to treat women with endometriosis, dysfunctional uterine bleeding, or advanced breast cancer. This medicine may be used for other purposes; ask your health care provider or pharmacist if you have questions. COMMON BRAND NAME(S): Zoladex What should I tell my health care provider before I take this medicine? They need to know if you have any of these conditions (some only apply to women): -diabetes -heart disease or previous heart attack -high blood pressure -high cholesterol -kidney disease -osteoporosis or low bone density -problems passing urine -spinal cord injury -stroke -tobacco smoker -an unusual or allergic reaction to goserelin, hormone therapy, other medicines, foods, dyes, or preservatives -pregnant or trying to get pregnant -breast-feeding How should I use this medicine? This medicine is for injection under the skin. It is given by a health care professional in a hospital or clinic setting. Men receive this injection once every 4 weeks or once every 12 weeks. Women will only receive the once every 4 weeks injection. Talk to your pediatrician regarding the use of this medicine in children. Special care may be needed. Overdosage: If you think you have taken too much of this medicine contact a poison control center or emergency room at once. NOTE: This medicine is only for you. Do not share this medicine with others. What if I miss a dose? It is important not to miss your dose. Call your doctor or health care professional if you are unable to  keep an appointment. What may interact with this medicine? -female hormones like estrogen -herbal or dietary supplements like black cohosh, chasteberry, or DHEA -female hormones like testosterone -prasterone This list may not describe all possible interactions. Give your health care provider a list of all the medicines, herbs, non-prescription drugs, or dietary supplements you use. Also tell them if you smoke, drink alcohol, or use illegal drugs. Some items may interact with your medicine. What should I watch for while using this medicine? Visit your doctor or health care professional for regular checks on your progress. Your symptoms may appear to get worse during the first weeks of this therapy. Tell your doctor or healthcare professional if your symptoms do not start to get better or if they get worse after this time. Your bones may get weaker if you take this medicine for a long time. If you smoke or frequently drink alcohol you may increase your risk of bone loss. A family history of osteoporosis, chronic use of drugs for seizures (convulsions), or corticosteroids can also increase your risk of bone loss. Talk to your doctor about how to keep your bones strong. This medicine should stop regular monthly menstration in women. Tell your doctor if you continue to menstrate. Women should not become pregnant while taking this medicine or for 12 weeks after stopping this medicine. Women should inform their doctor if they wish to become pregnant or think they might be pregnant. There is a potential for serious side effects to an unborn child. Talk to your health care professional or pharmacist for more information. Do not breast-feed an infant while taking   this medicine. Men should inform their doctors if they wish to father a child. This medicine may lower sperm counts. Talk to your health care professional or pharmacist for more information. What side effects may I notice from receiving this  medicine? Side effects that you should report to your doctor or health care professional as soon as possible: -allergic reactions like skin rash, itching or hives, swelling of the face, lips, or tongue -bone pain -breathing problems -changes in vision -chest pain -feeling faint or lightheaded, falls -fever, chills -pain, swelling, warmth in the leg -pain, tingling, numbness in the hands or feet -signs and symptoms of low blood pressure like dizziness; feeling faint or lightheaded, falls; unusually weak or tired -stomach pain -swelling of the ankles, feet, hands -trouble passing urine or change in the amount of urine -unusually high or low blood pressure -unusually weak or tired Side effects that usually do not require medical attention (report to your doctor or health care professional if they continue or are bothersome): -change in sex drive or performance -changes in breast size in both males and females -changes in emotions or moods -headache -hot flashes -irritation at site where injected -loss of appetite -skin problems like acne, dry skin -vaginal dryness This list may not describe all possible side effects. Call your doctor for medical advice about side effects. You may report side effects to FDA at 1-800-FDA-1088. Where should I keep my medicine? This drug is given in a hospital or clinic and will not be stored at home. NOTE: This sheet is a summary. It may not cover all possible information. If you have questions about this medicine, talk to your doctor, pharmacist, or health care provider.  2019 Elsevier/Gold Standard (2014-01-11 11:10:35)  

## 2019-06-18 ENCOUNTER — Telehealth: Payer: Self-pay | Admitting: Hematology

## 2019-06-18 DIAGNOSIS — E7800 Pure hypercholesterolemia, unspecified: Secondary | ICD-10-CM | POA: Diagnosis not present

## 2019-06-18 DIAGNOSIS — E039 Hypothyroidism, unspecified: Secondary | ICD-10-CM | POA: Diagnosis not present

## 2019-06-18 DIAGNOSIS — E559 Vitamin D deficiency, unspecified: Secondary | ICD-10-CM | POA: Diagnosis not present

## 2019-06-18 DIAGNOSIS — Z8659 Personal history of other mental and behavioral disorders: Secondary | ICD-10-CM | POA: Diagnosis not present

## 2019-06-18 NOTE — Telephone Encounter (Signed)
Scheduled appt per 7/31 sch message - pt is aware of new appt date and time

## 2019-06-21 ENCOUNTER — Other Ambulatory Visit: Payer: Self-pay

## 2019-06-21 DIAGNOSIS — Z170 Estrogen receptor positive status [ER+]: Secondary | ICD-10-CM

## 2019-06-21 DIAGNOSIS — C50411 Malignant neoplasm of upper-outer quadrant of right female breast: Secondary | ICD-10-CM

## 2019-07-05 ENCOUNTER — Other Ambulatory Visit: Payer: Self-pay | Admitting: Hematology

## 2019-07-07 NOTE — Progress Notes (Signed)
[place]   [phone] [phone]   Clinic Follow up Note   Patient Care Team: [name], MD as PCP - General (Family Medicine) [name] (Family Medicine) [name], MD as Consulting Physician (General Surgery) [name], MD as Consulting Physician (Hematology) Kyung Rudd, MD as Consulting Physician (Radiation Oncology)  Date of Service:  07/08/2019  CHIEF COMPLAINT: F/u of right breast cancer, intermittent dizziness  SUMMARY OF ONCOLOGIC HISTORY: Oncology History Overview Note   Cancer Staging Malignant neoplasm of upper-outer quadrant of right breast in female, estrogen receptor positive ([place]) Staging form: Breast, AJCC 8th Edition - Clinical stage from 06/11/2017: Stage IB (cT3, cN0, cM0, G2, ER: Positive, PR: Positive, HER2: Positive) - Signed by [name], MD on 06/16/2017 - Pathologic stage from 07/22/2017: Stage IB (pT3(m), pN2a(sn), cM0, G2, ER: Positive, PR: Positive, HER2: Positive) - Signed by [name], NP on 08/04/2017    Malignant neoplasm of upper-outer quadrant of right breast in female, estrogen receptor positive ([place])  06/11/2017 Initial Biopsy   Diagnosis 06/11/17 Breast, right, needle core biopsy, 11:00 o'clock - INVASIVE DUCTAL CARCINOMA, G2   06/11/2017 Mammogram   [name] and MM Diagnostic Breast Tomo Bilateral 06/11/17 IMPRESSION: 1. Highly suspicious mass within the retroareolar right breast, extending from the 8:00 to 11:00 axes, anterior to posterior depth, extending anteriorly to the nipple with associated nipple retraction, measuring at least 5.5 cm by ultrasound  2. No sonographic evidence of metastatic lymphadenopathy in the right axilla. 3. No evidence of malignancy within the left breast.    06/11/2017 Initial Diagnosis   Malignant neoplasm of upper-outer quadrant of right breast in female, estrogen receptor positive ([place])   06/11/2017 Receptors her2   Estrogen Receptor: 100%,  POSITIVE, STRONG STAINING INTENSITY Progesterone Receptor: 100%, POSITIVE, STRONG STAINING INTENSITY Proliferation Marker Ki67: 12%Proliferation Marker Ki67: 12%  HER2 - **POSITIVE** RATIO OF HER2/CEP17 SIGNALS 2.55 AVERAGE HER2 COPY NUMBER PER CELL 4.85   06/25/2017 Genetic Testing   Negative genetic testing on the 9 gene STAT panel.  The STAT Breast cancer panel offered by [facility] includes sequencing and rearrangement analysis for the following 9 genes:  ATM, BRCA1, BRCA2, CDH1, CHEK2, PALB2, PTEN, STK11 and TP53.   The report date is June 25, 2017.  Negative genetic testing on the common hereditary cancer panel.  The Hereditary Gene Panel offered by [facility] includes sequencing and/or deletion duplication testing of the following 46 genes: APC, ATM, AXIN2, BARD1, BMPR1A, BRCA1, BRCA2, BRIP1, CDH1, CDKN2A (p14ARF), CDKN2A (p16INK4a), CHEK2, CTNNA1, DICER1, EPCAM (Deletion/duplication testing only), GREM1 (promoter region deletion/duplication testing only), KIT, MEN1, MLH1, MSH2, MSH3, MSH6, MUTYH, NBN, NF1, NHTL1, PALB2, PDGFRA, PMS2, POLD1, POLE, PTEN, RAD50, RAD51C, RAD51D, SDHB, SDHC, SDHD, SMAD4, SMARCA4. STK11, TP53, TSC1, TSC2, and VHL.  The following genes were evaluated for sequence changes only: SDHA and HOXB13 c.251G>A variant only.  The report date is June 25, 2017.    07/02/2017 Imaging   CT cAP 07/02/17 IMPRESSION: 1. Subareolar right breast mass. No compelling findings of nodal or metastatic involvement. 2. Old granulomatous disease. 3. There is a 7 mm enhancing or hyperdense focus posteriorly in segment 7 of the liver in the subcapsular region. By virtue of its small size this lesion is technically nonspecific although statistically likely to be a small benign lesions such as flash filling hemangioma. This may warrant surveillance. 4.  Prominent stool throughout the colon favors constipation. 5. Degenerative disc disease and spondylosis at L5-S1 likely causing mild  impingement.  07/02/2017 Imaging   Bone scan 07/02/17 IMPRESSION: Today' s exam is negative. I ascribed the tiny focus of activity just proximal to the left antecubital region to injection site.   07/22/2017 Surgery   RIGHT TOTAL MASTECTOMY WITH RIGHT AXILLARY SENTINEL LYMPH NODE BIOPSY, LEFT PROPHYLACTIC MASTECTOM and INSERTION PORT-A-CATH WITH [name] by [name] and [facility]    07/22/2017 Pathology Results   Diagnosis 1. Breast, simple mastectomy, Left - FIBROADENOMA. - NO MALIGNANCY IDENTIFIED. 2. 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 - ADDITIONAL FOCUS OF LOBULAR CARCINOMA, GRADE 2, SPANNING 2.2 CM. - INTERMEDIATE GRADE DUCTAL CARCINOMA IN SITU. - INVASIVE CARCINOMA COMES TO WITHIN 0.2 TO 0.3 CM OF THE DEEP MARGIN, FOCALLY. - TUMOR FOCALLY INVOLVES EPIDERMIS. - LYMPHOVASCULAR INVASION OF DERMAL LYMPHATICS. - ONE OF ONE LYMPH NODES NEGATIVE FOR CARCINOMA (0/1). - SEE ONCOLOGY TABLE. 3. Lymph node, sentinel, biopsy, Right axillary - ONE LYMPH NODE WITH ISOLATED TUMOR CELLS (0/1). 4. Lymph node, sentinel, biopsy, Right - METASTATIC CARCINOMA IN ONE OF ONE LYMPH NODES (1/1). 5. Lymph node, sentinel, biopsy, Right - ONE OF ONE LYMPH NODES NEGATIVE FOR CARCINOMA (0/1). 6. Lymph node, sentinel, biopsy, Right - METASTATIC CARCINOMA IN ONE OF ONE LYMPH NODES (1/1). - EXTRACAPSULAR EXTENSION. 7. Lymph node, sentinel, biopsy, Right - METASTATIC CARCINOMA IN ONE OF ONE LYMPH NODES (1/1). - EXTRACAPSULAR EXTENSION. 8. Lymph node, sentinel, biopsy, Right - ONE OF ONE LYMPH NODES NEGATIVE FOR CARCINOMA (0/1). 9. Lymph node, sentinel, biopsy, Right - ONE OF ONE LYMPH NODES NEGATIVE FOR CARCINOMA (0/1). 10. Lymph node, sentinel, biopsy, Right - METASTATIC CARCINOMA IN ONE OF ONE LYMPH NODES (1/1). - EXTRACAPSULAR EXTENSION. 11. Lymph node, biopsy, Right axillary - ONE OF ONE LYMPH NODES NEGATIVE FOR CARCINOMA (0/1).     07/22/2017 Receptors her2   Ductal carcinoma in primary breast tumor and node metastasis are ER 95%, PR 95% strongly positive, and HER2 + The lobular component in primary breast tumor is ER 95% positive, PR 95% positive, strong staining, HER-2 negative.   Original biopsy [id number]: Ductal morphology, Her2 FISH positive. Current specimen: Main focus is 75% ductal morphology, 25% lobular morphology. Smaller focus is lobular morphology. #2A Lobular morphology, Her IHC negative. #2E Lobular morphology, Her2 FISH negative, Her2 IHC negative. #4 50% lobular and 50% ductal morphology, Her2 FISH positive, Her2 IHC negative. #6 Ductal morphology #7 Ductal morphology #10 50% lobular, 50% ductal morphology   08/25/2017 Imaging   MRI abdomen done at [facility]:  Enhancing structure measing 5 mm is compatible with a flash filling hemangioma. No specific findings to suggest metastatic cancer   08/25/2017 Imaging   MRI Abdomen at [facility] 08/25/17  IMPRESSION: 1. Lesion in segment 7 of the liver has signal and enhancement characteristics compatible with a flash filling hemangioma. No specific findings identified to suggest metastatic disease.   08/29/2017 - 12/12/2017 Chemotherapy   adjuvant TCHP every 3 weeks for 6 cycles starting 08/29/17. Postponed and decreased Carbo to 621m and added Onpro with cycle 2 on 09/19/17 due to neutropenic fever. Carbplatin further reduced to 5067mstarting with cycle 3 due to thrombocytopenia. Herceptin was held for Cycle 4 and 5 due to worsening global longitudinal strain found on ECHO from 11/07/17. Completed 6 cycles on 12/12/17      09/04/2017 - 09/09/2017 Hospital Admission   Admit date: 09/04/2017 Discharge date: 09/09/2017  DISCHARGE DIAGNOSES:  Principal Problem:   [facility]:   Malignant neoplasm of upper-outer quadrant of  right breast in female, estrogen receptor positive ([place])   Port-A-Cath in place   Neutropenic fever ([place])    Hyponatremia   Malignant neoplasm of breast in female, estrogen receptor positive ([place])    12/28/2017 Imaging   CT CHEST IMPRESSION: 1. 2.3 x 2.7 x 1.2 cm irregular masslike opacity within the right middle lobe worrisome for malignancy/metastasis. 2. 3 new very small sclerotic lesions, within the sternum and the body of T4, worrisome for new metastases.   12/28/2017 - 01/05/2018 Hospital Admission   Admit date: 12/28/17-01/05/18 Admission diagnosis: multifocal pneumonia, influenza A, sepsis, hypoxia Additional comments: imaging during hospitalization notable for new irregular masslike opacity in the right middle lobe worrisome for metastasis and new very small sclerotic lesion within the sternum and the body of T4, worrisome for metastasis   12/29/2017 Imaging   CT ABD/PELVIS IMPRESSION: No definite acute intra-abdominal or intrapelvic abnormalities.  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   12/30/2017 Imaging   CT HEAD IMPRESSION: No acute intracranial abnormality or enhancing lesion.   01/16/2018 - 01/22/2019 Chemotherapy   Maintence Herceptin and Perjeta every 3 weeks for 6-12 months starting 01/16/18. Held 03/2018-07/17/18 due to low EF. Completed on 01/22/19    02/04/2018 PET scan   IMPRESSION: 1. The previous consolidation in the right middle lobe currently has a more nodular and reticular appearance with some associated mild atelectasis along its anterior margin. This demonstrates low-grade metabolic activity with maximum SUV 3.2 (background blood pool activity is 2.7). Although conceivably related to low-grade residuum from the patient's malignancy, the presence of calcified right hilar lymph nodes and small calcifications along the nodularity raise suspicion for a low-grade active granulomatous process or atypical infectious process. Surveillance is recommended. 2. Scattered deposits of hypermetabolic but benign metabolically active brown  fat.   02/23/2018 - 04/09/2018 Radiation Therapy   Radiation treatment dates:   02/23/2018 - 04/09/2018  Site/dose:   The patient initially received a dose of 50.4 Gy in 28 fractions to the right chest wall and supraclavicular region. This was delivered using a 3-D conformal, 4 field technique. The patient then received a boost to the mastectomy scar. This delivered an additional 10 Gy in 5 fractions using an en face electron field. The total dose was 60.4 Gy.  Narrative: The patient tolerated radiation treatment relatively well.   The patient had some expected skin irritation as she progressed during treatment. Moist desquamation was not present at the end of treatment.   03/20/2018 -  Anti-estrogen oral therapy   -Monthly Zoladex injection starting 03/20/18  -Letrozole 2.76m once dialy starting 05/03/18  -Switched to Anastrozole starting 07/2018 Due to her worsening back pain.   04/06/2018 Imaging   CT Chest wo contrast IMPRESSION: 1. Stable linear nodular thickening in the RIGHT middle lobe is favored post infectious or inflammatory scarring. 2. Calcified RIGHT hilar lymph node is consistent post infectious process. 3. Stable small LEFT upper lobe nodule. 4. Stable sclerotic lesion in the manubrium. Sclerotic lesion at T3 is more prominent (4 mm). Sclerotic lesion at L 1 is new from 07/02/2017.   04/06/2018 Imaging   04/06/2018 CT Chest IMPRESSION: 1. Stable linear nodular thickening in the RIGHT middle lobe is favored post infectious or inflammatory scarring. 2. Calcified RIGHT hilar lymph node is consistent post infectious process. 3. Stable small LEFT upper lobe nodule. 4. Stable sclerotic lesion in the manubrium. Sclerotic lesion at T3 is more prominent (4 mm). Sclerotic lesion at L 1 is new  from 07/02/2017.   04/10/2018 Echocardiogram   LV EF 45%-50%   05/25/2018 Echocardiogram   LV EF 45%-50%   07/13/2018 Echocardiogram   Normal LV size with EF 55-60%   07/31/2018 Imaging    07/31/2018 Bone Scan IMPRESSION: No evidence of skeletal metastatic disease.   07/31/2018 Imaging   07/31/2018 CT CAP IMPRESSION: Residual linear and nodular peribronchovascular opacities in the right middle lobe, with other scattered areas of ground-glass opacities in the right middle, right upper and right lower lobes, which are located within the previously demonstrated bulky airspace consolidation. The findings are improved from the chest CT dated 12/30/2017, and relatively stable from PET-CT dated 02/04/2018. These may represent post infectious/post inflammatory changes or residual malignancy.  Stable partially calcified right hilar lymphadenopathy.  Interval development of nodular soft tissue thickening within the anterior mediastinum in the area of the thymus, new from chest CT dated 12/30/2017. This may represent reactive thymic tissue, thymoma or lymphadenopathy.  1.8 cm probably physiologic left ovarian cyst. Borderline thickening of the endometrium measuring 15.6 mm. Further evaluation with pelvic ultrasound may be considered.  No new or suspicious findings within the abdomen.   09/15/2018 Echocardiogram   EF 55-60%   01/28/2019 Imaging   MRI Lumbar spie 01/28/19  IMPRESSION: 1. Subcentimeter lesions in the L1 and L2 vertebral bodies, indeterminate for metastases. 2. Mild lower lumbar disc degeneration without stenosis.   01/28/2019 Imaging   CT chest 01/28/19  IMPRESSION: Right lung post radiation changes. No active lung disease or other acute findings.  No evidence of recurrent or metastatic carcinoma within the thorax.      CURRENT THERAPY:  -Monthly Zoladex injections starting 03/20/18  -Letrozole 2.29m once daily starting 05/03/18, changed to anastrozole in 07/2018. Changed to Exemestane in 04/2019 due to back pain exacerbation.  -Neratinib 240 mg daily, starting 03/15/19  INTERVAL HISTORY:  Sheila [name] here for a follow up of ongoing  treatment. She presents to the clinic alone. She notes she fell 3 weeks ago after getting lightheaded and dizzy. He feels it might be from getting up too fast or dehydration. She notes she occasionally feels lightheaded but not near syncope like before. She also notes she had an ear infection 3 weeks ago.  She notes her BM are mostly controlled.     REVIEW OF SYSTEMS:   Constitutional: Denies fevers, chills or abnormal weight loss (+) occasional lightheadedness/dizziness.  Eyes: Denies blurriness of vision Ears, nose, mouth, throat, and face: Denies mucositis or sore throat Respiratory: Denies cough, dyspnea or wheezes Cardiovascular: Denies palpitation, chest discomfort or lower extremity swelling Gastrointestinal:  Denies nausea, heartburn or change in bowel habits Skin: Denies abnormal skin rashes Lymphatics: Denies new lymphadenopathy or easy bruising Neurological:Denies numbness, tingling or new weaknesses Behavioral/Psych: Mood is stable, no new changes  All other systems were reviewed with the patient and are negative.  MEDICAL HISTORY:  Past Medical History:  Diagnosis Date   Anxiety    Cancer ([place]    right breast    Cardiomyopathy ([place]    Family history of breast cancer    Family history of colon cancer    GERD (gastroesophageal reflux disease)    Headache    couple of migraines in the past   History of kidney stones    Vitamin D deficiency     SURGICAL HISTORY: Past Surgical History:  Procedure Laterality Date   egg donation     kidney stone removal     MASTECTOMY W/ SENTINEL NODE  BIOPSY Bilateral 07/22/2017   Procedure: RIGHT TOTAL MASTECTOMY WITH RIGHT AXILLARY SENTINEL LYMPH NODE BIOPSY, LEFT PROPHYLACTIC MASTECTOMY;  Surgeon: [name], MD;  Location: [facility];  Service: General;  Laterality: Bilateral;   PORTACATH PLACEMENT Right 07/22/2017   Procedure: INSERTION PORT-A-CATH WITH [name];  Surgeon: [name], MD;  Location: [place];   Service: General;  Laterality: Right;    I have reviewed the social history and family history with the patient and they are unchanged from previous note.  ALLERGIES:  has No Known Allergies.  MEDICATIONS:  Current Outpatient Medications  Medication Sig Dispense Refill   B Complex-C (B-COMPLEX WITH VITAMIN C) tablet Take 1 tablet by mouth daily.     carvedilol (COREG) 3.125 MG tablet Take 1 tablet (3.125 mg total) by mouth 2 (two) times daily. 180 tablet 3   Cholecalciferol (VITAMIN D) 2000 units tablet Take 2,000 Units by mouth daily.     cyclobenzaprine (FLEXERIL) 5 MG tablet Take 1 tablet (5 mg total) by mouth 2 (two) times daily as needed for muscle spasms. 30 tablet 0   diphenoxylate-atropine (LOMOTIL) 2.5-0.025 MG tablet Take 1-2 tablets by mouth 4 (four) times daily as needed for diarrhea or loose stools. 30 tablet 3   exemestane (AROMASIN) 25 MG tablet Take 1 tablet (25 mg total) by mouth daily after breakfast. 30 tablet 3   levothyroxine (SYNTHROID, LEVOTHROID) 25 MCG tablet Take 25 mcg by mouth daily before breakfast.     loperamide (IMODIUM) 2 MG capsule Take 1-2 capsules (2-4 mg total) by mouth 3 (three) times daily as needed for diarrhea or loose stools. 120 capsule 0   losartan (COZAAR) 25 MG tablet Take 0.5 tablets (12.5 mg total) by mouth at bedtime. 15 tablet 5   Melatonin 10 MG TABS Take 0.5 tablets by mouth.      naproxen sodium (ANAPROX) 220 MG tablet Take 220 mg 2 (two) times daily as needed by mouth (pain).      Neratinib Maleate (NERLYNX) 40 MG tablet Take 6 tabs (270m) once daily. Take w food 180 tablet 2   prochlorperazine (COMPAZINE) 10 MG tablet Take 1 tablet (10 mg total) by mouth every 6 (six) hours as needed (Nausea or vomiting). 45 tablet 3   venlafaxine XR (EFFEXOR-XR) 75 MG 24 hr capsule TAKE 3 CAPSULES BY MOUTH EVERY DAY WITH BREAKFAST 90 capsule 5   budesonide (ENTOCORT EC) 3 MG 24 hr capsule Take 1 capsule (3 mg total) by mouth daily. 20  capsule 0   Current Facility-Administered Medications  Medication Dose Route Frequency Provider Last Rate Last Dose   gi cocktail (Maalox,Lidocaine,Donnatal)  30 mL Oral Once THarle Stanford, PA-C        PHYSICAL EXAMINATION: ECOG PERFORMANCE STATUS: 1 - Symptomatic but completely ambulatory  Vitals:   07/08/19 1500 07/08/19 1501  BP:    Pulse:    Resp: 16 17  Temp:    SpO2: 100% 100%   Filed Weights   07/08/19 1420  Weight: 178 lb 3.2 oz (80.8 kg)    GENERAL:alert, no distress and comfortable SKIN: skin color, texture, turgor are normal, no rashes or significant lesions EYES: normal, Conjunctiva are pink and non-injected, sclera clear  NECK: supple, thyroid normal size, non-tender, without nodularity LYMPH:  no palpable lymphadenopathy in the cervical, axillary  LUNGS: clear to auscultation and percussion with normal breathing effort HEART: regular rate & rhythm and no murmurs and no lower extremity edema ABDOMEN:abdomen soft, non-tender and normal bowel sounds Musculoskeletal:no cyanosis of  digits and no clubbing  NEURO: alert & oriented x 3 with fluent speech, no focal motor/sensory deficits  LABORATORY DATA:  I have reviewed the data as listed CBC Latest Ref Rng & Units 07/08/2019 05/13/2019 04/15/2019  WBC 4.0 - 10.5 K/uL 4.2 3.5(L) 4.4  Hemoglobin 12.0 - 15.0 g/dL 11.1(L) 11.6(L) 10.7(L)  Hematocrit 36.0 - 46.0 % 33.9(L) 35.9(L) 33.0(L)  Platelets 150 - 400 K/uL 206 189 163     CMP Latest Ref Rng & Units 07/08/2019 05/13/2019 04/15/2019  Glucose 70 - 99 mg/dL 86 69(L) 122(H)  BUN 6 - 20 mg/dL _0 Creatinine 0.44 - 1.00 mg/dL 1.08(H) 1.09(H) 1.08(H)  Sodium 135 - 145 mmol/L 139 140 139  Potassium 3.5 - 5.1 mmol/L 4.2 4.2 3.2(L)  Chloride 98 - 111 mmol/L 106 109 106  CO2 22 - 32 mmol/L _1 Calcium 8.9 - 10.3 mg/dL 8.6(L) 8.4(L) 8.4(L)  Total Protein 6.5 - 8.1 g/dL 7.0 6.9 6.6  Total Bilirubin 0.3 - 1.2 mg/dL 0.3 0.2(L) 0.3  Alkaline Phos 38 - 126 U/L  100 80 87  AST 15 - 41 U/L 14(L) 11(L) 10(L)  ALT 0 - 44 U/L _2 RADIOGRAPHIC STUDIES: I have personally reviewed the radiological images as listed and agreed with the findings in the report. No results found.   ASSESSMENT & PLAN:  Sheila [name] is a 43 y.o. female with   1. Malignant neoplasm of upper-outer quadrant of right breast in female, mixed invasive ductal and lobular carcinoma, pT3(m)N2aM0, stage 1b, ductal carcinoma triple positive, lobular carcinoma ER+/PR+/HER2-, Grade 2 -She was diagnosed in 05/2017. She is s/pb/lmastectomy, adjuvant TCHP and radiationandmaintenanceHerceptin/Perjeta.  -Her genetic testing was negative. -She started adjuvant letrozole with monthlyZoladexinjections in 04/2018. Letrozole was subsequently changed to anastrozole, then exemestane due to some joint and back pain.  She tolerates exemestane well lately. -She has cancelled her BSO for now,and wants to have the surgeryin 2021. Shewill continue Zoladex injectionsmonthly -She started Neratinib 224m daily on 03/15/19.She is tolerating well with no more diarrhea.  -Labs reviewed, CBC and CMP WNL except Hg 11.1, Cr 1.08, Ca 8.6, AST 14. TSH, Lipid panel, Vit D and CA 27.29 still pending. Will proceed with Zoladex injection today  -I encouraged her to start low dose calcium twice a week. She is agreeable.  She had a history of kidney stone. -[facility] I will refer back to Dr. WDonne Hazel  -Continue Exemestane and Neratinib -f/u in 4 months   2.Vaginal bleeding -Started 04/12/19 with cramping, light to moderate flow, fist time since she started Zoladex injections on 03/20/18. She has been on this for year. -I checked her estradiol level was less than 5, in postmenopausal range.  -Her vaginal bleeding stopped after 4 days and has not recurred.  -We discussed other possibility of postmenopausal bleeding, may need a vaginal [place]She is still trying to  find  Gyn to follow up with.  -She still plans to wait until 2021 to proceed with BSO  3. Insomnia  -She has history of insomnia failed multiple agents such as lunesta, trazodone, tylenol pm, and xanax and only had mild relief with Remeron. -She previously was able to ween off Ativan andAmbienwhich improved her sleep. -She currently takeshigh doseMelatonin to help her sleep, is not very effective. -Continues to improve.Not mentioned again today, likely resolved.  4. Hot flash -Now mild and tolerable. ContinueEffexor2265mdaily. -Will monitor on exemestane  5. Bone Health -Continue Vitamin D supplement and Vitamin C rich food -We will order a bone density scan in 2021  6. DiffuseBack pain -Her CT Chest on 04/06/18 showed sclerotic lesions of spine in T3 and L1, buther bone scan in 07/2018 was negative  -Letrozole was changed to anastrozole in September 2019due to arthralgia -MRI lumbar from 01/28/19 shows lesions of L1 and L2indeterminate, will continue follow up -She will continueFlexeril and aleve for pain.  -Given her back pain did improve off Anastrozole, I switched her to Exemestane (04/23/19)  7. Lung lesions  -She was found to have masslike opacity in the right middle lobe when she was admitted for neutropenic fever in February 2019, it got progressively worse during the hospital stay, felt to be related to pneumonia, on subsequent follow-up scan, this has much improved, but still has some residual nodule opacity and groundglass opacity in the right lung on the last CT scan in September 2019. -CT Chestfrom 3/12/20showed overall much improved/near resolved, no concern for metastatic disease or newpulmonary nodules.I do not plan to repeat more CT chest for f/u.  8. Orthostatic hypotension -She had syncopal episode with lightheadedness and dizziness 3 weeks ago. She occasionally has lightheadedness upon standing.  -Her BP today is 87/63 (07/08/19). Her  orthostatic BP today was positive,  -She is on Coreg, Losartan for her heart condition from prior Herceptin treatment. Her last ECHO showed normalized EF. I hold pt to hold on Losartan and will send message to Dr. Aundra Dubin for her to follow up with her and adjust her cardiac meds -I also encouraged her to drink plenty of water.    PLAN: -Refer her back to surgeon for [facility] removal  -Will contact Dr. Aundra Dubin about medicatoin adjustment.  -Proceed with Zoladex today  -Continue Monthly Zoladex injections -Lab and f/u in 4 months  -Continue Exemestane and Neratinib  No problem-specific Assessment & Plan notes found for this encounter.   No orders of the defined types were placed in this encounter.  All questions were answered. The patient knows to call the clinic with any problems, questions or concerns. No barriers to learning was detected. I spent 20 minutes counseling the patient face to face. The total time spent in the appointment was 30 minutes and more than 50% was on counseling and review of test results     [name], MD 07/08/2019   I, Sheila [name], am acting as scribe for [name], MD.   I have reviewed the above documentation for accuracy and completeness, and I agree with the above.

## 2019-07-08 ENCOUNTER — Inpatient Hospital Stay: Payer: BC Managed Care – PPO | Attending: Nurse Practitioner

## 2019-07-08 ENCOUNTER — Encounter: Payer: Self-pay | Admitting: Hematology

## 2019-07-08 ENCOUNTER — Inpatient Hospital Stay: Payer: BC Managed Care – PPO

## 2019-07-08 ENCOUNTER — Other Ambulatory Visit: Payer: Self-pay

## 2019-07-08 ENCOUNTER — Inpatient Hospital Stay (HOSPITAL_BASED_OUTPATIENT_CLINIC_OR_DEPARTMENT_OTHER): Payer: BC Managed Care – PPO | Admitting: Hematology

## 2019-07-08 VITALS — BP 87/63 | HR 96 | Temp 98.7°F | Resp 17 | Ht 66.0 in | Wt 178.2 lb

## 2019-07-08 DIAGNOSIS — R232 Flushing: Secondary | ICD-10-CM | POA: Insufficient documentation

## 2019-07-08 DIAGNOSIS — I951 Orthostatic hypotension: Secondary | ICD-10-CM | POA: Diagnosis not present

## 2019-07-08 DIAGNOSIS — C50411 Malignant neoplasm of upper-outer quadrant of right female breast: Secondary | ICD-10-CM

## 2019-07-08 DIAGNOSIS — G4700 Insomnia, unspecified: Secondary | ICD-10-CM | POA: Insufficient documentation

## 2019-07-08 DIAGNOSIS — Z170 Estrogen receptor positive status [ER+]: Secondary | ICD-10-CM

## 2019-07-08 DIAGNOSIS — Z79811 Long term (current) use of aromatase inhibitors: Secondary | ICD-10-CM | POA: Insufficient documentation

## 2019-07-08 DIAGNOSIS — Z9221 Personal history of antineoplastic chemotherapy: Secondary | ICD-10-CM | POA: Insufficient documentation

## 2019-07-08 DIAGNOSIS — M549 Dorsalgia, unspecified: Secondary | ICD-10-CM | POA: Diagnosis not present

## 2019-07-08 DIAGNOSIS — F419 Anxiety disorder, unspecified: Secondary | ICD-10-CM | POA: Insufficient documentation

## 2019-07-08 DIAGNOSIS — Z95828 Presence of other vascular implants and grafts: Secondary | ICD-10-CM

## 2019-07-08 DIAGNOSIS — Z923 Personal history of irradiation: Secondary | ICD-10-CM | POA: Diagnosis not present

## 2019-07-08 DIAGNOSIS — Z452 Encounter for adjustment and management of vascular access device: Secondary | ICD-10-CM | POA: Insufficient documentation

## 2019-07-08 DIAGNOSIS — Z87442 Personal history of urinary calculi: Secondary | ICD-10-CM | POA: Diagnosis not present

## 2019-07-08 DIAGNOSIS — R42 Dizziness and giddiness: Secondary | ICD-10-CM | POA: Diagnosis not present

## 2019-07-08 DIAGNOSIS — Z79899 Other long term (current) drug therapy: Secondary | ICD-10-CM | POA: Insufficient documentation

## 2019-07-08 LAB — COMPREHENSIVE METABOLIC PANEL
ALT: 12 U/L (ref 0–44)
AST: 14 U/L — ABNORMAL LOW (ref 15–41)
Albumin: 3.7 g/dL (ref 3.5–5.0)
Alkaline Phosphatase: 100 U/L (ref 38–126)
Anion gap: 8 (ref 5–15)
BUN: 8 mg/dL (ref 6–20)
CO2: 25 mmol/L (ref 22–32)
Calcium: 8.6 mg/dL — ABNORMAL LOW (ref 8.9–10.3)
Chloride: 106 mmol/L (ref 98–111)
Creatinine, Ser: 1.08 mg/dL — ABNORMAL HIGH (ref 0.44–1.00)
GFR calc Af Amer: >60 mL/min (ref >60)
GFR calc non Af Amer: >60 mL/min (ref >60)
Glucose, Bld: 86 mg/dL (ref 70–99)
Potassium: 4.2 mmol/L (ref 3.5–5.1)
Sodium: 139 mmol/L (ref 135–145)
Total Bilirubin: 0.3 mg/dL (ref 0.3–1.2)
Total Protein: 7 g/dL (ref 6.5–8.1)

## 2019-07-08 LAB — CBC WITH DIFFERENTIAL/PLATELET
Abs Immature Granulocytes: 0.01 10*3/uL (ref 0.00–0.07)
Basophils Absolute: 0 10*3/uL (ref 0.0–0.1)
Basophils Relative: 1 %
Eosinophils Absolute: 0.2 10*3/uL (ref 0.0–0.5)
Eosinophils Relative: 4 %
HCT: 33.9 % — ABNORMAL LOW (ref 36.0–46.0)
Hemoglobin: 11.1 g/dL — ABNORMAL LOW (ref 12.0–15.0)
Immature Granulocytes: 0 %
Lymphocytes Relative: 26 %
Lymphs Abs: 1.1 10*3/uL (ref 0.7–4.0)
MCH: 30.2 pg (ref 26.0–34.0)
MCHC: 32.7 g/dL (ref 30.0–36.0)
MCV: 92.4 fL (ref 80.0–100.0)
Monocytes Absolute: 0.3 10*3/uL (ref 0.1–1.0)
Monocytes Relative: 8 %
Neutro Abs: 2.5 10*3/uL (ref 1.7–7.7)
Neutrophils Relative %: 61 %
Platelets: 206 10*3/uL (ref 150–400)
RBC: 3.67 MIL/uL — ABNORMAL LOW (ref 3.87–5.11)
RDW: 12.9 % (ref 11.5–15.5)
WBC: 4.2 10*3/uL (ref 4.0–10.5)
nRBC: 0 % (ref 0.0–0.2)

## 2019-07-08 LAB — LIPID PANEL
Cholesterol: 207 mg/dL — ABNORMAL HIGH (ref 0–200)
HDL: 44 mg/dL (ref >40)
LDL Cholesterol: 129 mg/dL — ABNORMAL HIGH (ref 0–99)
Total CHOL/HDL Ratio: 4.7 RATIO
Triglycerides: 168 mg/dL — ABNORMAL HIGH (ref <150)
VLDL: 34 mg/dL (ref 0–40)

## 2019-07-08 LAB — TSH: TSH: 4.237 u[IU]/mL — ABNORMAL HIGH (ref 0.308–3.960)

## 2019-07-08 MED ORDER — GOSERELIN ACETATE 3.6 MG ~~LOC~~ IMPL
3.6000 mg | DRUG_IMPLANT | Freq: Once | SUBCUTANEOUS | Status: AC
  Administered 2019-07-08: 15:00:00 3.6 mg via SUBCUTANEOUS

## 2019-07-08 MED ORDER — SODIUM CHLORIDE 0.9% FLUSH
10.0000 mL | Freq: Once | INTRAVENOUS | Status: AC
  Administered 2019-07-08: 10 mL
  Filled 2019-07-08: qty 10

## 2019-07-08 MED ORDER — HEPARIN SOD (PORK) LOCK FLUSH 100 UNIT/ML IV SOLN
500.0000 [IU] | Freq: Once | INTRAVENOUS | Status: AC
  Administered 2019-07-08: 500 [IU]
  Filled 2019-07-08: qty 5

## 2019-07-08 MED ORDER — GOSERELIN ACETATE 3.6 MG ~~LOC~~ IMPL
DRUG_IMPLANT | SUBCUTANEOUS | Status: AC
  Filled 2019-07-08: qty 3.6

## 2019-07-08 NOTE — Patient Instructions (Signed)

## 2019-07-08 NOTE — Patient Instructions (Signed)

## 2019-07-09 ENCOUNTER — Telehealth: Payer: Self-pay

## 2019-07-09 ENCOUNTER — Telehealth: Payer: Self-pay | Admitting: Hematology

## 2019-07-09 LAB — CANCER ANTIGEN 27.29: CA 27.29: 8.2 U/mL (ref 0.0–38.6)

## 2019-07-09 LAB — VITAMIN D 25 HYDROXY (VIT D DEFICIENCY, FRACTURES): Vit D, 25-Hydroxy: 27.1 ng/mL — ABNORMAL LOW (ref 30.0–100.0)

## 2019-07-09 NOTE — Telephone Encounter (Signed)
Faxed lab results from 07/08/2019 to Dr. Drema Dallas at [facility]   Fax [phone]

## 2019-07-09 NOTE — Telephone Encounter (Signed)
-----   Message from [name], MD sent at 07/09/2019  9:17 AM EDT ----- Thank you very much Dalton.  Malachy Mood, please call her and let her stop both Coreg and Losartan due to her low BP.   Thanks   Krista Blue  ----- Message ----- From: Larey Dresser, MD Sent: 07/09/2019   8:55 AM EDT To: Jolaine Artist, MD, [name], MD  I'm just on vacation.  Her EF is back to normal, she can stop both Coreg and losartan if she is getting orthostatic.  ----- Message ----- From: [name], MD Sent: 07/08/2019   9:03 PM EDT To: Jolaine Artist, MD, Larey Dresser, MD, #  Dan,  I heard Kirk Ruths is on PAL This pt has been on coreg and losartan for low EF from Herceptin last year, she has completed Herceptin. She has been having intermittent lightheadedness lately, and she passed out once a few weeks ago.  She was orthostatic, with SBP in 80's in my office today, asymptomatic. I told her her cardiac meds needs to be adjusted. Could you let your nurse give her a call and adjust her meds, and schedule her back to Dr. Aundra Dubin?   Thanks  Krista Blue

## 2019-07-09 NOTE — Telephone Encounter (Signed)
-----   Message from Jonnie Finner, RN sent at 06/21/2019  2:51 PM EDT ----- Regarding: Lipid panel, tsh, vitamin D fax results to Dr. Drema Dallas at [facility] [phone]

## 2019-07-09 NOTE — Telephone Encounter (Signed)
Scheduled appt per 8/20 los.  Left a voice message of appt date and times.

## 2019-07-09 NOTE — Telephone Encounter (Signed)
Spoke with patient to let her know that Dr. Burr Medico spoke with cardiologist and they want her to stop both the Coreg and Losartan due to her low BP.  She verbalized an understanding and states she will do so.

## 2019-07-12 ENCOUNTER — Telehealth: Payer: Self-pay

## 2019-07-12 NOTE — Telephone Encounter (Signed)
Contacted patient, made aware of results and to increase the Vit D. Patient stated that the 2000 units is a capsule so recommended that she increase to 2 tablets daily for total of 4000 units. Patient labs were also faxed to Dr. Drema Dallas office for follow up.

## 2019-07-12 NOTE — Telephone Encounter (Signed)
-----   Message from [name], MD sent at 07/11/2019 11:25 PM EDT ----- My nurse, please fax her lab results to her PCP, especially her TSH and lipid panel results, I will let Dr. Drema Dallas to manage these issues. Let pt know also, and encourage her to increase vitD intake from 2000u to 5000u daily, tumor marker was normal, no other concerns,  thanks   [name]  07/11/2019

## 2019-07-19 ENCOUNTER — Telehealth: Payer: Self-pay | Admitting: Hematology

## 2019-07-19 NOTE — Telephone Encounter (Signed)
Faxed medical records from 04/19/19 through present to [facility] @ [phone]

## 2019-07-22 ENCOUNTER — Telehealth: Payer: Self-pay

## 2019-07-22 NOTE — Telephone Encounter (Signed)
Faxed script for Nerlynx to [facility] [phone], sent to HIM for scan to chart.

## 2019-07-29 DIAGNOSIS — C50111 Malignant neoplasm of central portion of right female breast: Secondary | ICD-10-CM | POA: Diagnosis not present

## 2019-07-31 ENCOUNTER — Other Ambulatory Visit: Payer: Self-pay | Admitting: Hematology

## 2019-08-03 ENCOUNTER — Other Ambulatory Visit: Payer: Self-pay | Admitting: General Surgery

## 2019-08-05 ENCOUNTER — Other Ambulatory Visit: Payer: Self-pay

## 2019-08-05 ENCOUNTER — Inpatient Hospital Stay: Payer: BC Managed Care – PPO | Attending: Nurse Practitioner

## 2019-08-05 ENCOUNTER — Inpatient Hospital Stay: Payer: BC Managed Care – PPO

## 2019-08-05 DIAGNOSIS — Z79818 Long term (current) use of other agents affecting estrogen receptors and estrogen levels: Secondary | ICD-10-CM | POA: Diagnosis not present

## 2019-08-05 DIAGNOSIS — Z170 Estrogen receptor positive status [ER+]: Secondary | ICD-10-CM | POA: Insufficient documentation

## 2019-08-05 DIAGNOSIS — C50411 Malignant neoplasm of upper-outer quadrant of right female breast: Secondary | ICD-10-CM | POA: Diagnosis not present

## 2019-08-05 DIAGNOSIS — Z95828 Presence of other vascular implants and grafts: Secondary | ICD-10-CM

## 2019-08-05 MED ORDER — GOSERELIN ACETATE 3.6 MG ~~LOC~~ IMPL
3.6000 mg | DRUG_IMPLANT | Freq: Once | SUBCUTANEOUS | Status: AC
  Administered 2019-08-05: 3.6 mg via SUBCUTANEOUS

## 2019-08-05 MED ORDER — SODIUM CHLORIDE 0.9% FLUSH
10.0000 mL | Freq: Once | INTRAVENOUS | Status: DC
  Filled 2019-08-05: qty 10

## 2019-08-05 MED ORDER — GOSERELIN ACETATE 3.6 MG ~~LOC~~ IMPL
DRUG_IMPLANT | SUBCUTANEOUS | Status: AC
  Filled 2019-08-05: qty 3.6

## 2019-08-05 MED ORDER — HEPARIN SOD (PORK) LOCK FLUSH 100 UNIT/ML IV SOLN
500.0000 [IU] | Freq: Once | INTRAVENOUS | Status: DC
  Filled 2019-08-05: qty 5

## 2019-08-05 NOTE — Progress Notes (Signed)
Pt stated that she did not want flush because she is getting her port removed Oct 1st. Only zoladex INJ today

## 2019-08-10 ENCOUNTER — Other Ambulatory Visit: Payer: Self-pay

## 2019-08-10 ENCOUNTER — Encounter (HOSPITAL_BASED_OUTPATIENT_CLINIC_OR_DEPARTMENT_OTHER): Payer: Self-pay | Admitting: *Deleted

## 2019-08-12 ENCOUNTER — Other Ambulatory Visit: Payer: Self-pay

## 2019-08-12 ENCOUNTER — Telehealth: Payer: Self-pay

## 2019-08-12 DIAGNOSIS — C50411 Malignant neoplasm of upper-outer quadrant of right female breast: Secondary | ICD-10-CM

## 2019-08-12 NOTE — Progress Notes (Signed)

## 2019-08-12 NOTE — Telephone Encounter (Signed)
Received a voicemail from [facility] with Dr. Drema Dallas office at [facility] asking if Dr. Burr Medico would mind drawing some additional labs on this patient at her visit in December to save the patient from having to be stuck again.   Called Anderson Malta back at [phone] to tell her that it was no problem to add those labs in and our office could fax them over [phone] when they resulted. Anderson Malta requested: lipid panel with reflex, total vitamin D, ALT/AST, and TSH. Orders placed and reminder set to send results to [facility].

## 2019-08-16 ENCOUNTER — Other Ambulatory Visit (HOSPITAL_COMMUNITY)
Admission: RE | Admit: 2019-08-16 | Discharge: 2019-08-16 | Disposition: A | Discharge status: Home / Self Care | Payer: BC Managed Care – PPO | Source: Ambulatory Visit | Attending: General Surgery | Admitting: General Surgery

## 2019-08-16 DIAGNOSIS — Z01812 Encounter for preprocedural laboratory examination: Secondary | ICD-10-CM | POA: Diagnosis not present

## 2019-08-16 DIAGNOSIS — Z20828 Contact with and (suspected) exposure to other viral communicable diseases: Secondary | ICD-10-CM | POA: Insufficient documentation

## 2019-08-16 DIAGNOSIS — C50911 Malignant neoplasm of unspecified site of right female breast: Secondary | ICD-10-CM | POA: Diagnosis not present

## 2019-08-17 LAB — NOVEL CORONAVIRUS, NAA (HOSP ORDER, SEND-OUT TO REF LAB; TAT 18-24 HRS): SARS-CoV-2, NAA: NOT DETECTED

## 2019-08-19 ENCOUNTER — Encounter (HOSPITAL_BASED_OUTPATIENT_CLINIC_OR_DEPARTMENT_OTHER)
Admission: RE | Disposition: A | Discharge status: Home / Self Care | Payer: Self-pay | Source: Home / Self Care | Attending: General Surgery

## 2019-08-19 ENCOUNTER — Other Ambulatory Visit: Payer: Self-pay

## 2019-08-19 ENCOUNTER — Ambulatory Visit (HOSPITAL_BASED_OUTPATIENT_CLINIC_OR_DEPARTMENT_OTHER): Payer: BC Managed Care – PPO | Admitting: Anesthesiology

## 2019-08-19 ENCOUNTER — Encounter (HOSPITAL_BASED_OUTPATIENT_CLINIC_OR_DEPARTMENT_OTHER): Payer: Self-pay | Admitting: *Deleted

## 2019-08-19 ENCOUNTER — Ambulatory Visit (HOSPITAL_BASED_OUTPATIENT_CLINIC_OR_DEPARTMENT_OTHER)
Admission: RE | Admit: 2019-08-19 | Discharge: 2019-08-19 | Disposition: A | Discharge status: Home / Self Care | Payer: BC Managed Care – PPO | Attending: General Surgery | Admitting: General Surgery

## 2019-08-19 DIAGNOSIS — Z452 Encounter for adjustment and management of vascular access device: Secondary | ICD-10-CM | POA: Insufficient documentation

## 2019-08-19 DIAGNOSIS — Z79890 Hormone replacement therapy (postmenopausal): Secondary | ICD-10-CM | POA: Insufficient documentation

## 2019-08-19 DIAGNOSIS — K219 Gastro-esophageal reflux disease without esophagitis: Secondary | ICD-10-CM | POA: Insufficient documentation

## 2019-08-19 DIAGNOSIS — C50911 Malignant neoplasm of unspecified site of right female breast: Secondary | ICD-10-CM | POA: Insufficient documentation

## 2019-08-19 DIAGNOSIS — F419 Anxiety disorder, unspecified: Secondary | ICD-10-CM | POA: Diagnosis not present

## 2019-08-19 DIAGNOSIS — I429 Cardiomyopathy, unspecified: Secondary | ICD-10-CM | POA: Diagnosis not present

## 2019-08-19 DIAGNOSIS — Z170 Estrogen receptor positive status [ER+]: Secondary | ICD-10-CM | POA: Diagnosis not present

## 2019-08-19 DIAGNOSIS — E559 Vitamin D deficiency, unspecified: Secondary | ICD-10-CM | POA: Insufficient documentation

## 2019-08-19 DIAGNOSIS — Z803 Family history of malignant neoplasm of breast: Secondary | ICD-10-CM | POA: Insufficient documentation

## 2019-08-19 DIAGNOSIS — Z79899 Other long term (current) drug therapy: Secondary | ICD-10-CM | POA: Insufficient documentation

## 2019-08-19 DIAGNOSIS — E039 Hypothyroidism, unspecified: Secondary | ICD-10-CM | POA: Diagnosis not present

## 2019-08-19 DIAGNOSIS — E872 Acidosis: Secondary | ICD-10-CM | POA: Diagnosis not present

## 2019-08-19 DIAGNOSIS — L905 Scar conditions and fibrosis of skin: Secondary | ICD-10-CM | POA: Diagnosis not present

## 2019-08-19 DIAGNOSIS — Z87891 Personal history of nicotine dependence: Secondary | ICD-10-CM | POA: Insufficient documentation

## 2019-08-19 DIAGNOSIS — C50411 Malignant neoplasm of upper-outer quadrant of right female breast: Secondary | ICD-10-CM | POA: Diagnosis not present

## 2019-08-19 DIAGNOSIS — A419 Sepsis, unspecified organism: Secondary | ICD-10-CM | POA: Diagnosis not present

## 2019-08-19 HISTORY — PX: PORT-A-CATH REMOVAL: SHX5289

## 2019-08-19 HISTORY — PX: MASS EXCISION: SHX2000

## 2019-08-19 SURGERY — REMOVAL PORT-A-CATH
Anesthesia: General | Site: Chest | Laterality: Right

## 2019-08-19 MED ORDER — CEFAZOLIN SODIUM-DEXTROSE 2-4 GM/100ML-% IV SOLN
2.0000 g | INTRAVENOUS | Status: AC
  Administered 2019-08-19: 2 g via INTRAVENOUS

## 2019-08-19 MED ORDER — PHENYLEPHRINE 40 MCG/ML (10ML) SYRINGE FOR IV PUSH (FOR BLOOD PRESSURE SUPPORT)
PREFILLED_SYRINGE | INTRAVENOUS | Status: AC
  Filled 2019-08-19: qty 10

## 2019-08-19 MED ORDER — GABAPENTIN 100 MG PO CAPS
100.0000 mg | ORAL_CAPSULE | ORAL | Status: AC
  Administered 2019-08-19: 100 mg via ORAL

## 2019-08-19 MED ORDER — ACETAMINOPHEN 500 MG PO TABS
ORAL_TABLET | ORAL | Status: AC
  Filled 2019-08-19: qty 2

## 2019-08-19 MED ORDER — BUPIVACAINE HCL (PF) 0.25 % IJ SOLN
INTRAMUSCULAR | Status: DC | PRN
  Administered 2019-08-19: 10 mL

## 2019-08-19 MED ORDER — LIDOCAINE 2% (20 MG/ML) 5 ML SYRINGE
INTRAMUSCULAR | Status: AC
  Filled 2019-08-19: qty 5

## 2019-08-19 MED ORDER — MIDAZOLAM HCL 2 MG/2ML IJ SOLN
1.0000 mg | INTRAMUSCULAR | Status: DC | PRN
  Administered 2019-08-19: 2 mg via INTRAVENOUS

## 2019-08-19 MED ORDER — ENSURE PRE-SURGERY PO LIQD: 296.0000 mL | Freq: Once | ORAL | Status: DC

## 2019-08-19 MED ORDER — FENTANYL CITRATE (PF) 100 MCG/2ML IJ SOLN
INTRAMUSCULAR | Status: AC
  Filled 2019-08-19: qty 2

## 2019-08-19 MED ORDER — ONDANSETRON HCL 4 MG/2ML IJ SOLN
INTRAMUSCULAR | Status: DC | PRN
  Administered 2019-08-19: 4 mg via INTRAVENOUS

## 2019-08-19 MED ORDER — KETOROLAC TROMETHAMINE 15 MG/ML IJ SOLN
15.0000 mg | INTRAMUSCULAR | Status: AC
  Administered 2019-08-19: 15 mg via INTRAVENOUS

## 2019-08-19 MED ORDER — ONDANSETRON HCL 4 MG/2ML IJ SOLN
INTRAMUSCULAR | Status: AC
  Filled 2019-08-19: qty 2

## 2019-08-19 MED ORDER — CEFAZOLIN SODIUM-DEXTROSE 2-4 GM/100ML-% IV SOLN
INTRAVENOUS | Status: AC
  Filled 2019-08-19: qty 100

## 2019-08-19 MED ORDER — PHENYLEPHRINE HCL (PRESSORS) 10 MG/ML IV SOLN
INTRAVENOUS | Status: DC | PRN
  Administered 2019-08-19: 200 ug via INTRAVENOUS

## 2019-08-19 MED ORDER — KETOROLAC TROMETHAMINE 15 MG/ML IJ SOLN
INTRAMUSCULAR | Status: AC
  Filled 2019-08-19: qty 1

## 2019-08-19 MED ORDER — FENTANYL CITRATE (PF) 100 MCG/2ML IJ SOLN
25.0000 ug | INTRAMUSCULAR | Status: DC | PRN
  Administered 2019-08-19: 50 ug via INTRAVENOUS

## 2019-08-19 MED ORDER — PROPOFOL 10 MG/ML IV BOLUS
INTRAVENOUS | Status: DC | PRN
  Administered 2019-08-19: 200 mg via INTRAVENOUS
  Administered 2019-08-19: 100 mg via INTRAVENOUS

## 2019-08-19 MED ORDER — ACETAMINOPHEN 500 MG PO TABS
1000.0000 mg | ORAL_TABLET | ORAL | Status: AC
  Administered 2019-08-19: 1000 mg via ORAL

## 2019-08-19 MED ORDER — DEXAMETHASONE SODIUM PHOSPHATE 10 MG/ML IJ SOLN
INTRAMUSCULAR | Status: AC
  Filled 2019-08-19: qty 1

## 2019-08-19 MED ORDER — LIDOCAINE 2% (20 MG/ML) 5 ML SYRINGE
INTRAMUSCULAR | Status: DC | PRN
  Administered 2019-08-19: 60 mg via INTRAVENOUS

## 2019-08-19 MED ORDER — DEXAMETHASONE SODIUM PHOSPHATE 4 MG/ML IJ SOLN
INTRAMUSCULAR | Status: DC | PRN
  Administered 2019-08-19: 10 mg via INTRAVENOUS

## 2019-08-19 MED ORDER — MIDAZOLAM HCL 2 MG/2ML IJ SOLN
INTRAMUSCULAR | Status: AC
  Filled 2019-08-19: qty 2

## 2019-08-19 MED ORDER — FENTANYL CITRATE (PF) 100 MCG/2ML IJ SOLN
50.0000 ug | INTRAMUSCULAR | Status: DC | PRN
  Administered 2019-08-19: 50 ug via INTRAVENOUS

## 2019-08-19 MED ORDER — TRAMADOL HCL 50 MG PO TABS: 50.0000 mg | ORAL_TABLET | Freq: Four times a day (QID) | ORAL | 0 refills | Status: AC | PRN

## 2019-08-19 MED ORDER — GABAPENTIN 100 MG PO CAPS
ORAL_CAPSULE | ORAL | Status: AC
  Filled 2019-08-19: qty 1

## 2019-08-19 MED ORDER — LACTATED RINGERS IV SOLN
INTRAVENOUS | Status: DC
  Administered 2019-08-19: 11:00:00 via INTRAVENOUS

## 2019-08-19 SURGICAL SUPPLY — 51 items
BLADE CLIPPER SURG (BLADE) IMPLANT
BLADE SURG 15 STRL LF DISP TIS (BLADE) ×2 IMPLANT
BLADE SURG 15 STRL SS (BLADE) ×1
CANISTER SUCT 1200ML W/VALVE (MISCELLANEOUS) IMPLANT
CHLORAPREP W/TINT 26 (MISCELLANEOUS) ×3 IMPLANT
CLSR STERI-STRIP ANTIMIC 1/2X4 (GAUZE/BANDAGES/DRESSINGS) ×3 IMPLANT
COVER BACK TABLE REUSABLE LG (DRAPES) ×3 IMPLANT
COVER MAYO STAND REUSABLE (DRAPES) ×3 IMPLANT
COVER WAND RF STERILE (DRAPES) IMPLANT
DECANTER SPIKE VIAL GLASS SM (MISCELLANEOUS) ×3 IMPLANT
DERMABOND ADVANCED (GAUZE/BANDAGES/DRESSINGS) ×1
DERMABOND ADVANCED .7 DNX12 (GAUZE/BANDAGES/DRESSINGS) ×2 IMPLANT
DRAPE LAPAROTOMY 100X72 PEDS (DRAPES) ×3 IMPLANT
DRAPE UTILITY XL STRL (DRAPES) ×3 IMPLANT
DRSG TEGADERM 4X4.75 (GAUZE/BANDAGES/DRESSINGS) IMPLANT
ELECT COATED BLADE 2.86 ST (ELECTRODE) ×3 IMPLANT
ELECT REM PT RETURN 9FT ADLT (ELECTROSURGICAL) ×3
ELECTRODE REM PT RTRN 9FT ADLT (ELECTROSURGICAL) ×2 IMPLANT
GAUZE PACKING IODOFORM 1/4X15 (GAUZE/BANDAGES/DRESSINGS) IMPLANT
GAUZE SPONGE 4X4 12PLY STRL LF (GAUZE/BANDAGES/DRESSINGS) IMPLANT
GLOVE BIO SURGEON STRL SZ 6.5 (GLOVE) ×3 IMPLANT
GLOVE BIO SURGEON STRL SZ7 (GLOVE) ×9 IMPLANT
GLOVE BIOGEL PI IND STRL 7.0 (GLOVE) ×6 IMPLANT
GLOVE BIOGEL PI IND STRL 7.5 (GLOVE) ×2 IMPLANT
GLOVE BIOGEL PI INDICATOR 7.0 (GLOVE) ×3
GLOVE BIOGEL PI INDICATOR 7.5 (GLOVE) ×1
GOWN STRL REUS W/ TWL LRG LVL3 (GOWN DISPOSABLE) ×6 IMPLANT
GOWN STRL REUS W/TWL LRG LVL3 (GOWN DISPOSABLE) ×3
KIT MARKER MARGIN INK (KITS) IMPLANT
NEEDLE HYPO 25X1 1.5 SAFETY (NEEDLE) ×3 IMPLANT
NS IRRIG 1000ML POUR BTL (IV SOLUTION) IMPLANT
PACK BASIN DAY SURGERY FS (CUSTOM PROCEDURE TRAY) ×3 IMPLANT
PENCIL SMOKE EVACUATOR (MISCELLANEOUS) ×3 IMPLANT
SLEEVE SCD COMPRESS KNEE MED (MISCELLANEOUS) ×3 IMPLANT
SPONGE LAP 4X18 RFD (DISPOSABLE) IMPLANT
SUT ETHILON 2 0 FS 18 (SUTURE) IMPLANT
SUT MNCRL AB 4-0 PS2 18 (SUTURE) ×6 IMPLANT
SUT SILK 2 0 SH (SUTURE) ×3 IMPLANT
SUT VIC AB 2-0 SH 27 (SUTURE) ×1
SUT VIC AB 2-0 SH 27XBRD (SUTURE) ×2 IMPLANT
SUT VIC AB 3-0 SH 27 (SUTURE) ×1
SUT VIC AB 3-0 SH 27X BRD (SUTURE) ×2 IMPLANT
SUT VICRYL 3-0 CR8 SH (SUTURE) ×3 IMPLANT
SUT VICRYL 4-0 PS2 18IN ABS (SUTURE) ×3 IMPLANT
SWAB COLLECTION DEVICE MRSA (MISCELLANEOUS) IMPLANT
SWAB CULTURE ESWAB REG 1ML (MISCELLANEOUS) IMPLANT
SYR CONTROL 10ML LL (SYRINGE) ×3 IMPLANT
TOWEL GREEN STERILE FF (TOWEL DISPOSABLE) ×3 IMPLANT
TUBE CONNECTING 20X1/4 (TUBING) IMPLANT
UNDERPAD 30X36 HEAVY ABSORB (UNDERPADS AND DIAPERS) IMPLANT
YANKAUER SUCT BULB TIP NO VENT (SUCTIONS) ×3 IMPLANT

## 2019-08-19 NOTE — Anesthesia Postprocedure Evaluation (Signed)
Anesthesia Post Note  Patient: Sheila [name]  Procedure(s) Performed: REMOVAL PORT-A-CATH (Right Chest) REMOVE EXCESS TISSUE FROM RIGHT LATERAL MASTECTOMY SCAR (Right Breast)     Patient location during evaluation: PACU Anesthesia Type: General Level of consciousness: awake and alert Pain management: pain level controlled Vital Signs Assessment: post-procedure vital signs reviewed and stable Respiratory status: spontaneous breathing, nonlabored ventilation, respiratory function stable and patient connected to nasal cannula oxygen Cardiovascular status: blood pressure returned to baseline and stable Postop Assessment: no apparent nausea or vomiting Anesthetic complications: no    Last Vitals:  Vitals:   08/19/19 1315 08/19/19 1330  BP: 107/71 99/62  Pulse: 84 88  Resp: 10 12  Temp:    SpO2: 100% 100%    Last Pain:  Vitals:   08/19/19 1330  TempSrc:   PainSc: 3                  Christalynn Boise L Aarian Cleaver

## 2019-08-19 NOTE — Anesthesia Procedure Notes (Signed)
Procedure Name: LMA Insertion Performed by: Geneva Barrero W, CRNA Pre-anesthesia Checklist: Patient identified, Emergency Drugs available, Suction available and Patient being monitored Patient Re-evaluated:Patient Re-evaluated prior to induction Oxygen Delivery Method: Circle system utilized Preoxygenation: Pre-oxygenation with 100% oxygen Induction Type: IV induction Ventilation: Mask ventilation without difficulty LMA: LMA inserted LMA Size: 4.0 Number of attempts: 1 Placement Confirmation: positive ETCO2 Tube secured with: Tape Dental Injury: Teeth and Oropharynx as per pre-operative assessment        

## 2019-08-19 NOTE — Op Note (Signed)
Preoperative diagnosis: mastectomy scar excess tissue, breast cancer no longer needs venous access Postoperative diagnosis: same as above Procedure:  1. Port removal 2. Excision of excess tissue right mastectomy scar, 6x2x4 cm Surgeon: Dr Serita Grammes EBL: minimal Complications none Anesthesia general Specimens: right mastectomy scar revision long lateral short superior Drains none Sponge and needle count correct dispo to recovery stable  Indications This is a 23 yof who is s/p right mastectomy with some excess tissue laterally. She has also completed therapy and no longer needs the port.  Procedure: After informed consent obtained she was taken to OR. She was given antibiotics and SCDs were on. She was placed under general anesthesia without complication. She was prepped and draped in standard sterile surgical fashion. Timeout performed.  I infiltrated marcaine at site of port. I then reentered incision. I removed the port, line and suture material in their entirety.  Hemostasis observed. I closed with 3-0 vicryl, 4-0 monocryl and glue.  I then removed a 6x2x4 cm area of skin and soft tissue laterally that I had marked with her standing.  This was marked and sent to pathology. I then closed this to the chest wall with 2-0 vicryl.  I closed the skin and subq with 3-0 vicryl and 4-0 monocryl. Glue and steristrips applied. She tolerated well and was transferred to recovery stable

## 2019-08-19 NOTE — Discharge Instructions (Signed)
° ° °  May take next dose of Tylenol at 4:00 PM on 08/19/2019 if needed.  May take next dose of Ibuprofen at 6:00 PM on 08/19/2019 if needed.      Post Anesthesia Home Care Instructions  Activity: Get plenty of rest for the remainder of the day. A responsible individual must stay with you for 24 hours following the procedure.  For the next 24 hours, DO NOT: -Drive a car -Paediatric nurse -Drink alcoholic beverages -Take any medication unless instructed by your physician -Make any legal decisions or sign important papers.  Meals: Start with liquid foods such as gelatin or soup. Progress to regular foods as tolerated. Avoid greasy, spicy, heavy foods. If nausea and/or vomiting occur, drink only clear liquids until the nausea and/or vomiting subsides. Call your physician if vomiting continues.  Special Instructions/Symptoms: Your throat may feel dry or sore from the anesthesia or the breathing tube placed in your throat during surgery. If this causes discomfort, gargle with warm salt water. The discomfort should disappear within 24 hours.  If you had a scopolamine patch placed behind your ear for the management of post- operative nausea and/or vomiting:  1. The medication in the patch is effective for 72 hours, after which it should be removed.  Wrap patch in a tissue and discard in the trash. Wash hands thoroughly with soap and water. 2. You may remove the patch earlier than 72 hours if you experience unpleasant side effects which may include dry mouth, dizziness or visual disturbances. 3. Avoid touching the patch. Wash your hands with soap and water after contact with the patch.

## 2019-08-19 NOTE — H&P (Signed)
Sheila [name] is an 43 y.o. female.   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 she is doing well. just sore. left path is fa and otherwise negative. 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 the margins are clear. there is involvement of the skin and dermal lymphatics but this was not an inflammatory cancer clinically. it was just locally invasive. she has 4/10 nodes positive (one additional had itcs). repeat prognostic panel is her 2 negative, er/pr positive. she is on antiestrogen and doing zoladex injections. has had number of issues but doing well now. she has completed all therapy now and is doing well. has some excess tissue at right lateral mastectomy scar that is bothering her and irritating with clothes  Past Medical History:  Diagnosis Date  . Anxiety   . Cancer [facility])    right breast   . Cardiomyopathy ([place])   . Family history of breast cancer   . Family history of colon cancer   . GERD (gastroesophageal reflux disease)   . Headache    couple of migraines in the past  . History of kidney stones   . Vitamin D deficiency     Past Surgical History:  Procedure Laterality Date  . egg donation    . kidney stone removal    . MASTECTOMY W/ SENTINEL NODE BIOPSY Bilateral 07/22/2017   Procedure: RIGHT TOTAL MASTECTOMY WITH RIGHT AXILLARY SENTINEL LYMPH NODE BIOPSY, LEFT PROPHYLACTIC MASTECTOMY;  Surgeon: [name], MD;  Location: [place];  Service: General;  Laterality: Bilateral;  . PORTACATH PLACEMENT Right 07/22/2017   Procedure: INSERTION PORT-A-CATH WITH [name];  Surgeon: [name], MD;  Location: [place];  Service: General;  Laterality: Right;    Family History  Problem Relation Age of Onset  . Breast cancer Paternal Grandmother 32  . Colon cancer Paternal Grandmother 64  .  Breast cancer Mother 59  . Osteoporosis Mother   . Prostate cancer Paternal Grandfather        dx in his 29s  . Leukemia Paternal Grandfather   . Lung cancer Maternal Uncle        heavy smoker  . Other Paternal Uncle        farm accident  . Stroke Maternal Grandfather    Social History:  reports that she has never smoked. She has quit using smokeless tobacco. She reports current alcohol use. She reports that she does not use drugs.  Allergies: No Known Allergies  Facility-Administered Medications Prior to Admission  Medication Dose Route Frequency Provider Last Rate Last Dose  . gi cocktail (Maalox,Lidocaine,Donnatal)  30 mL Oral Once Harle Stanford., PA-C       Medications Prior to Admission  Medication Sig Dispense Refill  . B Complex-C (B-COMPLEX WITH VITAMIN C) tablet Take 1 tablet by mouth daily.    . calcium-vitamin D (OSCAL WITH D) 250-125 MG-UNIT tablet Take 1 tablet by mouth daily.    . carvedilol (COREG) 3.125 MG tablet Take 1 tablet (3.125 mg total) by mouth 2 (two) times daily. 180 tablet 3  . Cholecalciferol (VITAMIN D) 2000 units tablet Take 2,000 Units by mouth daily.    . cyclobenzaprine (FLEXERIL) 5 MG tablet Take 1 tablet (5 mg total) by mouth 2 (two) times daily as needed for muscle spasms. [place]  tablet 0  . exemestane (AROMASIN) 25 MG tablet TAKE 1 TABLET (25 MG TOTAL) BY MOUTH DAILY AFTER BREAKFAST. 30 tablet 3  . fluticasone (FLONASE) 50 MCG/ACT nasal spray Place into both nostrils daily.    Marland Kitchen levothyroxine (SYNTHROID, LEVOTHROID) 25 MCG tablet Take 25 mcg by mouth daily before breakfast.    . losartan (COZAAR) 25 MG tablet Take 0.5 tablets (12.5 mg total) by mouth at bedtime. 15 tablet 5  . Melatonin 10 MG TABS Take 0.5 tablets by mouth.     . naproxen sodium (ANAPROX) 220 MG tablet Take 220 mg 2 (two) times daily as needed by mouth (pain).     . Neratinib Maleate (NERLYNX) 40 MG tablet Take 6 tabs (240mg ) once daily. Take w food 180 tablet 2  . prochlorperazine  (COMPAZINE) 10 MG tablet Take 1 tablet (10 mg total) by mouth every 6 (six) hours as needed (Nausea or vomiting). 45 tablet 3  . venlafaxine XR (EFFEXOR-XR) 75 MG 24 hr capsule TAKE 3 CAPSULES BY MOUTH EVERY DAY WITH BREAKFAST 90 capsule 5  . budesonide (ENTOCORT EC) 3 MG 24 hr capsule Take 1 capsule (3 mg total) by mouth daily. 20 capsule 0  . diphenoxylate-atropine (LOMOTIL) 2.5-0.025 MG tablet Take 1-2 tablets by mouth 4 (four) times daily as needed for diarrhea or loose stools. 30 tablet 3  . loperamide (IMODIUM) 2 MG capsule Take 1-2 capsules (2-4 mg total) by mouth 3 (three) times daily as needed for diarrhea or loose stools. 120 capsule 0    No results found for this or any previous visit (from the past 48 hour(s)). No results found.  Review of Systems  All other systems reviewed and are negative.   Blood pressure 107/71, pulse 99, temperature 98.4 F (36.9 C), temperature source Oral, resp. rate 16, height 5\' 6"  (1.676 m), weight 82.3 kg, SpO2 98 %. 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 discussed risks and recovery  [name], MD 08/19/2019, 10:21 AM

## 2019-08-19 NOTE — Transfer of Care (Signed)
Immediate Anesthesia Transfer of Care Note  Patient: Sheila [name]  Procedure(s) Performed: REMOVAL PORT-A-CATH (Right Chest) REMOVE EXCESS TISSUE FROM RIGHT LATERAL MASTECTOMY SCAR (Right Breast)  Patient Location: PACU  Anesthesia Type:General  Level of Consciousness: awake and sedated  Airway & Oxygen Therapy: Patient Spontanous Breathing and Patient connected to nasal cannula oxygen  Post-op Assessment: Report given to RN and Post -op Vital signs reviewed and stable  Post vital signs: Reviewed and stable  Last Vitals:  Vitals Value Taken Time  BP 109/55 08/19/19 1231  Temp    Pulse 80 08/19/19 1235  Resp 14 08/19/19 1235  SpO2 100 % 08/19/19 1235  Vitals shown include unvalidated device data.  Last Pain:  Vitals:   08/19/19 1232  TempSrc:   PainSc: (P) Asleep         Complications: No apparent anesthesia complications

## 2019-08-19 NOTE — Anesthesia Preprocedure Evaluation (Addendum)
Anesthesia Evaluation  Patient identified by MRN, date of birth, ID band Patient awake    Reviewed: Allergy & Precautions, NPO status , Patient's Chart, lab work & pertinent test results, reviewed documented beta blocker date and time   Airway Mallampati: III  TM Distance: >3 FB Neck ROM: Full  Mouth opening: Limited Mouth Opening  Dental no notable dental hx. (+) Teeth Intact, Dental Advisory Given   Pulmonary neg pulmonary ROS,    Pulmonary exam normal breath sounds clear to auscultation       Cardiovascular negative cardio ROS Normal cardiovascular exam Rhythm:Regular Rate:Normal  TTE 2020 normal   Neuro/Psych  Headaches, PSYCHIATRIC DISORDERS Anxiety    GI/Hepatic Neg liver ROS, GERD  Controlled and Medicated,  Endo/Other  Hypothyroidism   Renal/GU negative Renal ROS  negative genitourinary   Musculoskeletal negative musculoskeletal ROS (+)   Abdominal   Peds  Hematology negative hematology ROS (+)   Anesthesia Other Findings Right breast cancer  Reproductive/Obstetrics                            Anesthesia Physical Anesthesia Plan  ASA: II  Anesthesia Plan: General   Post-op Pain Management:    Induction: Intravenous  PONV Risk Score and Plan: 3 and Midazolam, Dexamethasone and Ondansetron  Airway Management Planned: LMA  Additional Equipment:   Intra-op Plan:   Post-operative Plan: Extubation in OR  Informed Consent: I have reviewed the patients History and Physical, chart, labs and discussed the procedure including the risks, benefits and alternatives for the proposed anesthesia with the patient or authorized representative who has indicated his/her understanding and acceptance.     Dental advisory given  Plan Discussed with: CRNA  Anesthesia Plan Comments:        Anesthesia Quick Evaluation

## 2019-08-19 NOTE — Interval H&P Note (Signed)
History and Physical Interval Note:  08/19/2019 11:27 AM  Sheila [name]  has presented today for surgery, with the diagnosis of RIGHT BREAST CANCER.  The various methods of treatment have been discussed with the patient and family. After consideration of risks, benefits and other options for treatment, the patient has consented to  Procedure(s): REMOVAL PORT-A-CATH (N/A) REMOVE EXCESS TISSUE FROM RIGHT LATERAL MASTECTOMY SCAR (N/A) as a surgical intervention.  The patient's history has been reviewed, patient examined, no change in status, stable for surgery.  I have reviewed the patient's chart and labs.  Questions were answered to the patient's satisfaction.     [name]

## 2019-08-20 ENCOUNTER — Encounter (HOSPITAL_BASED_OUTPATIENT_CLINIC_OR_DEPARTMENT_OTHER): Payer: Self-pay | Admitting: General Surgery

## 2019-08-20 LAB — SURGICAL PATHOLOGY

## 2019-08-23 ENCOUNTER — Telehealth: Payer: Self-pay

## 2019-08-23 NOTE — Telephone Encounter (Signed)
Faxed script for Nerlynx 40 mg to [facility] [phone], sent to HIM for scan to chart.

## 2019-09-09 ENCOUNTER — Inpatient Hospital Stay: Payer: BC Managed Care – PPO | Attending: Nurse Practitioner

## 2019-09-09 ENCOUNTER — Other Ambulatory Visit: Payer: Self-pay

## 2019-09-09 VITALS — BP 112/67 | Temp 98.0°F | Resp 18

## 2019-09-09 DIAGNOSIS — Z79818 Long term (current) use of other agents affecting estrogen receptors and estrogen levels: Secondary | ICD-10-CM | POA: Insufficient documentation

## 2019-09-09 DIAGNOSIS — Z170 Estrogen receptor positive status [ER+]: Secondary | ICD-10-CM | POA: Diagnosis not present

## 2019-09-09 DIAGNOSIS — C50411 Malignant neoplasm of upper-outer quadrant of right female breast: Secondary | ICD-10-CM | POA: Diagnosis not present

## 2019-09-09 DIAGNOSIS — Z95828 Presence of other vascular implants and grafts: Secondary | ICD-10-CM

## 2019-09-09 MED ORDER — GOSERELIN ACETATE 3.6 MG ~~LOC~~ IMPL
DRUG_IMPLANT | SUBCUTANEOUS | Status: AC
  Filled 2019-09-09: qty 3.6

## 2019-09-09 MED ORDER — GOSERELIN ACETATE 3.6 MG ~~LOC~~ IMPL
3.6000 mg | DRUG_IMPLANT | Freq: Once | SUBCUTANEOUS | Status: AC
  Administered 2019-09-09: 3.6 mg via SUBCUTANEOUS

## 2019-09-09 NOTE — Patient Instructions (Signed)

## 2019-10-07 ENCOUNTER — Other Ambulatory Visit: Payer: Self-pay

## 2019-10-07 ENCOUNTER — Inpatient Hospital Stay: Payer: BC Managed Care – PPO | Attending: Nurse Practitioner

## 2019-10-07 VITALS — BP 116/79 | HR 100 | Temp 98.0°F | Resp 18

## 2019-10-07 DIAGNOSIS — Z95828 Presence of other vascular implants and grafts: Secondary | ICD-10-CM

## 2019-10-07 DIAGNOSIS — Z9221 Personal history of antineoplastic chemotherapy: Secondary | ICD-10-CM | POA: Insufficient documentation

## 2019-10-07 DIAGNOSIS — Z79818 Long term (current) use of other agents affecting estrogen receptors and estrogen levels: Secondary | ICD-10-CM | POA: Diagnosis not present

## 2019-10-07 DIAGNOSIS — Z170 Estrogen receptor positive status [ER+]: Secondary | ICD-10-CM | POA: Diagnosis not present

## 2019-10-07 DIAGNOSIS — C50411 Malignant neoplasm of upper-outer quadrant of right female breast: Secondary | ICD-10-CM | POA: Diagnosis not present

## 2019-10-07 DIAGNOSIS — Z923 Personal history of irradiation: Secondary | ICD-10-CM | POA: Diagnosis not present

## 2019-10-07 MED ORDER — GOSERELIN ACETATE 3.6 MG ~~LOC~~ IMPL
DRUG_IMPLANT | SUBCUTANEOUS | Status: AC
  Filled 2019-10-07: qty 3.6

## 2019-10-07 MED ORDER — GOSERELIN ACETATE 3.6 MG ~~LOC~~ IMPL
3.6000 mg | DRUG_IMPLANT | Freq: Once | SUBCUTANEOUS | Status: AC
  Administered 2019-10-07: 14:00:00 3.6 mg via SUBCUTANEOUS

## 2019-10-07 NOTE — Patient Instructions (Signed)

## 2019-11-03 NOTE — Progress Notes (Signed)
[employer]   [phone] [phone]   Clinic Follow up Note   Patient Care Team: [name], MD as PCP - General (Family Medicine) [name] (Family Medicine) [name], MD as Consulting Physician (General Surgery) [name], MD as Consulting Physician (Hematology) Kyung Rudd, MD as Consulting Physician (Radiation Oncology)  Date of Service:  11/04/2019  CHIEF COMPLAINT: F/u of right breast cancer, intermittent dizziness  SUMMARY OF ONCOLOGIC HISTORY: Oncology History Overview Note   Cancer Staging Malignant neoplasm of upper-outer quadrant of right breast in female, estrogen receptor positive ([place]) Staging form: Breast, AJCC 8th Edition - Clinical stage from 06/11/2017: Stage IB (cT3, cN0, cM0, G2, ER: Positive, PR: Positive, HER2: Positive) - Signed by [name], MD on 06/16/2017 - Pathologic stage from 07/22/2017: Stage IB (pT3(m), pN2a(sn), cM0, G2, ER: Positive, PR: Positive, HER2: Positive) - Signed by [name], NP on 08/04/2017    Malignant neoplasm of upper-outer quadrant of right breast in female, estrogen receptor positive ([place])  06/11/2017 Initial Biopsy   Diagnosis 06/11/17 Breast, right, needle core biopsy, 11:00 o'clock - INVASIVE DUCTAL CARCINOMA, G2   06/11/2017 Mammogram   [name] and MM Diagnostic Breast Tomo Bilateral 06/11/17 IMPRESSION: 1. Highly suspicious mass within the retroareolar right breast, extending from the 8:00 to 11:00 axes, anterior to posterior depth, extending anteriorly to the nipple with associated nipple retraction, measuring at least 5.5 cm by ultrasound  2. No sonographic evidence of metastatic lymphadenopathy in the right axilla. 3. No evidence of malignancy within the left breast.    06/11/2017 Initial Diagnosis   Malignant neoplasm of upper-outer quadrant of right breast in female, estrogen receptor positive ([place])   06/11/2017 Receptors her2   Estrogen Receptor: 100%,  POSITIVE, STRONG STAINING INTENSITY Progesterone Receptor: 100%, POSITIVE, STRONG STAINING INTENSITY Proliferation Marker Ki67: 12%Proliferation Marker Ki67: 12%  HER2 - **POSITIVE** RATIO OF HER2/CEP17 SIGNALS 2.55 AVERAGE HER2 COPY NUMBER PER CELL 4.85   06/25/2017 Genetic Testing   Negative genetic testing on the 9 gene STAT panel.  The STAT Breast cancer panel offered by [facility] includes sequencing and rearrangement analysis for the following 9 genes:  ATM, BRCA1, BRCA2, CDH1, CHEK2, PALB2, PTEN, STK11 and TP53.   The report date is June 25, 2017.  Negative genetic testing on the common hereditary cancer panel.  The Hereditary Gene Panel offered by [facility] includes sequencing and/or deletion duplication testing of the following 46 genes: APC, ATM, AXIN2, BARD1, BMPR1A, BRCA1, BRCA2, BRIP1, CDH1, CDKN2A (p14ARF), CDKN2A (p16INK4a), CHEK2, CTNNA1, DICER1, EPCAM (Deletion/duplication testing only), GREM1 (promoter region deletion/duplication testing only), KIT, MEN1, MLH1, MSH2, MSH3, MSH6, MUTYH, NBN, NF1, NHTL1, PALB2, PDGFRA, PMS2, POLD1, POLE, PTEN, RAD50, RAD51C, RAD51D, SDHB, SDHC, SDHD, SMAD4, SMARCA4. STK11, TP53, TSC1, TSC2, and VHL.  The following genes were evaluated for sequence changes only: SDHA and HOXB13 c.251G>A variant only.  The report date is June 25, 2017.    07/02/2017 Imaging   CT cAP 07/02/17 IMPRESSION: 1. Subareolar right breast mass. No compelling findings of nodal or metastatic involvement. 2. Old granulomatous disease. 3. There is a 7 mm enhancing or hyperdense focus posteriorly in segment 7 of the liver in the subcapsular region. By virtue of its small size this lesion is technically nonspecific although statistically likely to be a small benign lesions such as flash filling hemangioma. This may warrant surveillance. 4.  Prominent stool throughout the colon favors constipation. 5. Degenerative disc disease and spondylosis at L5-S1 likely causing mild  impingement.  07/02/2017 Imaging   Bone scan 07/02/17 IMPRESSION: Today' s exam is negative. I ascribed the tiny focus of activity just proximal to the left antecubital region to injection site.   07/22/2017 Surgery   RIGHT TOTAL MASTECTOMY WITH RIGHT AXILLARY SENTINEL LYMPH NODE BIOPSY, LEFT PROPHYLACTIC MASTECTOM and INSERTION PORT-A-CATH WITH [name] by [name] and [facility]    07/22/2017 Pathology Results   Diagnosis 1. Breast, simple mastectomy, Left - FIBROADENOMA. - NO MALIGNANCY IDENTIFIED. 2. 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 - ADDITIONAL FOCUS OF LOBULAR CARCINOMA, GRADE 2, SPANNING 2.2 CM. - INTERMEDIATE GRADE DUCTAL CARCINOMA IN SITU. - INVASIVE CARCINOMA COMES TO WITHIN 0.2 TO 0.3 CM OF THE DEEP MARGIN, FOCALLY. - TUMOR FOCALLY INVOLVES EPIDERMIS. - LYMPHOVASCULAR INVASION OF DERMAL LYMPHATICS. - ONE OF ONE LYMPH NODES NEGATIVE FOR CARCINOMA (0/1). - SEE ONCOLOGY TABLE. 3. Lymph node, sentinel, biopsy, Right axillary - ONE LYMPH NODE WITH ISOLATED TUMOR CELLS (0/1). 4. Lymph node, sentinel, biopsy, Right - METASTATIC CARCINOMA IN ONE OF ONE LYMPH NODES (1/1). 5. Lymph node, sentinel, biopsy, Right - ONE OF ONE LYMPH NODES NEGATIVE FOR CARCINOMA (0/1). 6. Lymph node, sentinel, biopsy, Right - METASTATIC CARCINOMA IN ONE OF ONE LYMPH NODES (1/1). - EXTRACAPSULAR EXTENSION. 7. Lymph node, sentinel, biopsy, Right - METASTATIC CARCINOMA IN ONE OF ONE LYMPH NODES (1/1). - EXTRACAPSULAR EXTENSION. 8. Lymph node, sentinel, biopsy, Right - ONE OF ONE LYMPH NODES NEGATIVE FOR CARCINOMA (0/1). 9. Lymph node, sentinel, biopsy, Right - ONE OF ONE LYMPH NODES NEGATIVE FOR CARCINOMA (0/1). 10. Lymph node, sentinel, biopsy, Right - METASTATIC CARCINOMA IN ONE OF ONE LYMPH NODES (1/1). - EXTRACAPSULAR EXTENSION. 11. Lymph node, biopsy, Right axillary - ONE OF ONE LYMPH NODES NEGATIVE FOR CARCINOMA (0/1).     07/22/2017 Receptors her2   Ductal carcinoma in primary breast tumor and node metastasis are ER 95%, PR 95% strongly positive, and HER2 + The lobular component in primary breast tumor is ER 95% positive, PR 95% positive, strong staining, HER-2 negative.   Original biopsy [id number]: Ductal morphology, Her2 FISH positive. Current specimen: Main focus is 75% ductal morphology, 25% lobular morphology. Smaller focus is lobular morphology. #2A Lobular morphology, Her IHC negative. #2E Lobular morphology, Her2 FISH negative, Her2 IHC negative. #4 50% lobular and 50% ductal morphology, Her2 FISH positive, Her2 IHC negative. #6 Ductal morphology #7 Ductal morphology #10 50% lobular, 50% ductal morphology   08/25/2017 Imaging   MRI abdomen done at [facility]:  Enhancing structure measing 5 mm is compatible with a flash filling hemangioma. No specific findings to suggest metastatic cancer   08/25/2017 Imaging   MRI Abdomen at [facility] 08/25/17  IMPRESSION: 1. Lesion in segment 7 of the liver has signal and enhancement characteristics compatible with a flash filling hemangioma. No specific findings identified to suggest metastatic disease.   08/29/2017 - 12/12/2017 Chemotherapy   adjuvant TCHP every 3 weeks for 6 cycles starting 08/29/17. Postponed and decreased Carbo to '650mg'$  and added Onpro with cycle 2 on 09/19/17 due to neutropenic fever. Carbplatin further reduced to '500mg'$  starting with cycle 3 due to thrombocytopenia. Herceptin was held for Cycle 4 and 5 due to worsening global longitudinal strain found on ECHO from 11/07/17. Completed 6 cycles on 12/12/17      09/04/2017 - 09/09/2017 Hospital Admission   Admit date: 09/04/2017 Discharge date: 09/09/2017  DISCHARGE DIAGNOSES:  Principal Problem:   Sepsis [facility]) Active Problems:   Malignant neoplasm of upper-outer quadrant of  right breast in female, estrogen receptor positive ([place])   Port-A-Cath in place   Neutropenic fever ([place])    Hyponatremia   Malignant neoplasm of breast in female, estrogen receptor positive ([place])    12/28/2017 Imaging   CT CHEST IMPRESSION: 1. 2.3 x 2.7 x 1.2 cm irregular masslike opacity within the right middle lobe worrisome for malignancy/metastasis. 2. 3 new very small sclerotic lesions, within the sternum and the body of T4, worrisome for new metastases.   12/28/2017 - 01/05/2018 Hospital Admission   Admit date: 12/28/17-01/05/18 Admission diagnosis: multifocal pneumonia, influenza A, sepsis, hypoxia Additional comments: imaging during hospitalization notable for new irregular masslike opacity in the right middle lobe worrisome for metastasis and new very small sclerotic lesion within the sternum and the body of T4, worrisome for metastasis   12/29/2017 Imaging   CT ABD/PELVIS IMPRESSION: No definite acute intra-abdominal or intrapelvic abnormalities.  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   12/30/2017 Imaging   CT HEAD IMPRESSION: No acute intracranial abnormality or enhancing lesion.   01/16/2018 - 01/22/2019 Chemotherapy   Maintence Herceptin and Perjeta every 3 weeks for 6-12 months starting 01/16/18. Held 03/2018-07/17/18 due to low EF. Completed on 01/22/19    02/04/2018 PET scan   IMPRESSION: 1. The previous consolidation in the right middle lobe currently has a more nodular and reticular appearance with some associated mild atelectasis along its anterior margin. This demonstrates low-grade metabolic activity with maximum SUV 3.2 (background blood pool activity is 2.7). Although conceivably related to low-grade residuum from the patient's malignancy, the presence of calcified right hilar lymph nodes and small calcifications along the nodularity raise suspicion for a low-grade active granulomatous process or atypical infectious process. Surveillance is recommended. 2. Scattered deposits of hypermetabolic but benign metabolically active brown fat.    02/23/2018 - 04/09/2018 Radiation Therapy   Radiation treatment dates:   02/23/2018 - 04/09/2018  Site/dose:   The patient initially received a dose of 50.4 Gy in 28 fractions to the right chest wall and supraclavicular region. This was delivered using a 3-D conformal, 4 field technique. The patient then received a boost to the mastectomy scar. This delivered an additional 10 Gy in 5 fractions using an en face electron field. The total dose was 60.4 Gy.  Narrative: The patient tolerated radiation treatment relatively well.   The patient had some expected skin irritation as she progressed during treatment. Moist desquamation was not present at the end of treatment.   03/20/2018 -  Anti-estrogen oral therapy   -Monthly Zoladex injection starting 03/20/18  -Letrozole 2.'5mg'$  once dialy starting 05/03/18  -Switched to Anastrozole starting 07/2018 Due to her worsening back pain.   04/06/2018 Imaging   CT Chest wo contrast IMPRESSION: 1. Stable linear nodular thickening in the RIGHT middle lobe is favored post infectious or inflammatory scarring. 2. Calcified RIGHT hilar lymph node is consistent post infectious process. 3. Stable small LEFT upper lobe nodule. 4. Stable sclerotic lesion in the manubrium. Sclerotic lesion at T3 is more prominent (4 mm). Sclerotic lesion at L 1 is new from 07/02/2017.   04/06/2018 Imaging   04/06/2018 CT Chest IMPRESSION: 1. Stable linear nodular thickening in the RIGHT middle lobe is favored post infectious or inflammatory scarring. 2. Calcified RIGHT hilar lymph node is consistent post infectious process. 3. Stable small LEFT upper lobe nodule. 4. Stable sclerotic lesion in the manubrium. Sclerotic lesion at T3 is more prominent (4 mm). Sclerotic lesion at L 1 is new  from 07/02/2017.   04/10/2018 Echocardiogram   LV EF 45%-50%   05/25/2018 Echocardiogram   LV EF 45%-50%   07/13/2018 Echocardiogram   Normal LV size with EF 55-60%   07/31/2018 Imaging    07/31/2018 Bone Scan IMPRESSION: No evidence of skeletal metastatic disease.   07/31/2018 Imaging   07/31/2018 CT CAP IMPRESSION: Residual linear and nodular peribronchovascular opacities in the right middle lobe, with other scattered areas of ground-glass opacities in the right middle, right upper and right lower lobes, which are located within the previously demonstrated bulky airspace consolidation. The findings are improved from the chest CT dated 12/30/2017, and relatively stable from PET-CT dated 02/04/2018. These may represent post infectious/post inflammatory changes or residual malignancy.  Stable partially calcified right hilar lymphadenopathy.  Interval development of nodular soft tissue thickening within the anterior mediastinum in the area of the thymus, new from chest CT dated 12/30/2017. This may represent reactive thymic tissue, thymoma or lymphadenopathy.  1.8 cm probably physiologic left ovarian cyst. Borderline thickening of the endometrium measuring 15.6 mm. Further evaluation with pelvic ultrasound may be considered.  No new or suspicious findings within the abdomen.   09/15/2018 Echocardiogram   EF 55-60%   01/28/2019 Imaging   MRI Lumbar spie 01/28/19  IMPRESSION: 1. Subcentimeter lesions in the L1 and L2 vertebral bodies, indeterminate for metastases. 2. Mild lower lumbar disc degeneration without stenosis.   01/28/2019 Imaging   CT chest 01/28/19  IMPRESSION: Right lung post radiation changes. No active lung disease or other acute findings.  No evidence of recurrent or metastatic carcinoma within the thorax.      CURRENT THERAPY:  -Monthly Zoladex injections starting 03/20/18  -Letrozole 2.39m once daily starting 05/03/18, changed to anastrozole in 07/2018. Changed to Exemestane in 04/2019 due to back pain exacerbation. -Neratinib 240 mg daily, starting 03/15/19  INTERVAL HISTORY:  Sheila Estabrooksis here for a follow up of ongoing  treatment. She presents to the clinic alone. She notes she is doing well. She denies any new changes since last visit. She notes she has been tolerating Zoladex injections and Exemestane. She has notes most times she managed her hot flashes. It can effects her sleep. She takes Effexor TID. Overall she tolerated exemestane batter and her back pain has improved. She is also tolerating Neratinib well, no more diarrhea. She has not talked to a new Gyn since her old one left about her possible BSO. She notes no more vaginal bleeding. She noes her BP has been normal lately but elevated today.  She notes her mother has CMarked Treediagnosed 1 week ago and is managing at home. She would like to have a flu shot today.    REVIEW OF SYSTEMS:   Constitutional: Denies fevers, chills or abnormal weight loss (+) Moderate hot flashes.  Eyes: Denies blurriness of vision Ears, nose, mouth, throat, and face: Denies mucositis or sore throat Respiratory: Denies cough, dyspnea or wheezes Cardiovascular: Denies palpitation, chest discomfort or lower extremity swelling Gastrointestinal:  Denies nausea, heartburn or change in bowel habits Skin: Denies abnormal skin rashes MSK: (+) Improved back pain Lymphatics: Denies new lymphadenopathy or easy bruising Neurological:Denies numbness, tingling or new weaknesses Behavioral/Psych: Mood is stable, no new changes  All other systems were reviewed with the patient and are negative.  MEDICAL HISTORY:  Past Medical History:  Diagnosis Date  . Anxiety   . Cancer ([facility]    right breast   . Cardiomyopathy ([place]   . Family history of breast cancer   .  Family history of colon cancer   . GERD (gastroesophageal reflux disease)   . Headache    couple of migraines in the past  . History of kidney stones   . Vitamin D deficiency     SURGICAL HISTORY: Past Surgical History:  Procedure Laterality Date  . egg donation    . kidney stone removal    . MASS EXCISION Right 08/19/2019    Procedure: REMOVE EXCESS TISSUE FROM RIGHT LATERAL MASTECTOMY SCAR;  Surgeon: [name], MD;  Location: [place];  Service: General;  Laterality: Right;  . MASTECTOMY W/ SENTINEL NODE BIOPSY Bilateral 07/22/2017   Procedure: RIGHT TOTAL MASTECTOMY WITH RIGHT AXILLARY SENTINEL LYMPH NODE BIOPSY, LEFT PROPHYLACTIC MASTECTOMY;  Surgeon: [name], MD;  Location: [place];  Service: General;  Laterality: Bilateral;  . PORT-A-CATH REMOVAL Right 08/19/2019   Procedure: REMOVAL PORT-A-CATH;  Surgeon: [name], MD;  Location: [place];  Service: General;  Laterality: Right;  . PORTACATH PLACEMENT Right 07/22/2017   Procedure: INSERTION PORT-A-CATH WITH [name];  Surgeon: [name], MD;  Location: [place];  Service: General;  Laterality: Right;    I have reviewed the social history and family history with the patient and they are unchanged from previous note.  ALLERGIES:  has No Known Allergies.  MEDICATIONS:  Current Outpatient Medications  Medication Sig Dispense Refill  . B Complex-C (B-COMPLEX WITH VITAMIN C) tablet Take 1 tablet by mouth daily.    . calcium-vitamin D (OSCAL WITH D) 250-125 MG-UNIT tablet Take 1 tablet by mouth daily.    . Cholecalciferol (VITAMIN D) 2000 units tablet Take 2,000 Units by mouth daily.    . cyclobenzaprine (FLEXERIL) 5 MG tablet Take 1 tablet (5 mg total) by mouth 2 (two) times daily as needed for muscle spasms. 30 tablet 0  . diphenoxylate-atropine (LOMOTIL) 2.5-0.025 MG tablet Take 1-2 tablets by mouth 4 (four) times daily as needed for diarrhea or loose stools. 30 tablet 3  . exemestane (AROMASIN) 25 MG tablet TAKE 1 TABLET (25 MG TOTAL) BY MOUTH DAILY AFTER BREAKFAST. 90 tablet 1  . fluticasone (FLONASE) 50 MCG/ACT nasal spray Place into both nostrils daily.    Marland Kitchen levothyroxine (SYNTHROID, LEVOTHROID) 25 MCG tablet Take 25 mcg by mouth daily before breakfast.    . loperamide (IMODIUM) 2 MG capsule Take 1-2  capsules (2-4 mg total) by mouth 3 (three) times daily as needed for diarrhea or loose stools. 120 capsule 0  . Melatonin 10 MG TABS Take 0.5 tablets by mouth.     . naproxen sodium (ANAPROX) 220 MG tablet Take 220 mg 2 (two) times daily as needed by mouth (pain).     . Neratinib Maleate (NERLYNX) 40 MG tablet Take 6 tabs (240m) once daily. Take w food 180 tablet 2  . prochlorperazine (COMPAZINE) 10 MG tablet Take 1 tablet (10 mg total) by mouth every 6 (six) hours as needed (Nausea or vomiting). 45 tablet 3  . venlafaxine XR (EFFEXOR-XR) 75 MG 24 hr capsule TAKE 3 CAPSULES BY MOUTH EVERY DAY WITH BREAKFAST 90 capsule 5   Current Facility-Administered Medications  Medication Dose Route Frequency Provider Last Rate Last Admin  . gi cocktail (Maalox,Lidocaine,Donnatal)  30 mL Oral Once THarle Stanford, PA-C        PHYSICAL EXAMINATION: ECOG PERFORMANCE STATUS: 0 - Asymptomatic  Vitals:   11/04/19 1428  BP: 97/61  Pulse: (!) 130  Resp: 18  Temp: 98.2 F (36.8 C)  SpO2: 100%   Filed  Weights   11/04/19 1428  Weight: 183 lb 3.2 oz (83.1 kg)    GENERAL:alert, no distress and comfortable SKIN: skin color, texture, turgor are normal, no rashes or significant lesions EYES: normal, Conjunctiva are pink and non-injected, sclera clear  NECK: supple, thyroid normal size, non-tender, without nodularity LYMPH:  no palpable lymphadenopathy in the cervical, axillary  LUNGS: clear to auscultation and percussion with normal breathing effort HEART: regular rate & rhythm and no murmurs and no lower extremity edema ABDOMEN:abdomen soft, non-tender and normal bowel sounds Musculoskeletal:no cyanosis of digits and no clubbing  NEURO: alert & oriented x 3 with fluent speech, no focal motor/sensory deficits BREAST: S/p B/l mastectomy: surgical incisions healed well. No palpable mass, nodules or adenopathy bilaterally. Breast exam benign.   LABORATORY DATA:  I have reviewed the data as listed CBC  Latest Ref Rng & Units 11/04/2019 07/08/2019 05/13/2019  WBC 4.0 - 10.5 K/uL 5.2 4.2 3.5(L)  Hemoglobin 12.0 - 15.0 g/dL 12.3 11.1(L) 11.6(L)  Hematocrit 36.0 - 46.0 % 37.6 33.9(L) 35.9(L)  Platelets 150 - 400 K/uL 236 206 189     CMP Latest Ref Rng & Units 11/04/2019 07/08/2019 05/13/2019  Glucose 70 - 99 mg/dL 172(H) 86 69(L)  BUN 6 - 20 mg/dL _0 Creatinine 0.44 - 1.00 mg/dL 1.26(H) 1.08(H) 1.09(H)  Sodium 135 - 145 mmol/L 139 139 140  Potassium 3.5 - 5.1 mmol/L 4.0 4.2 4.2  Chloride 98 - 111 mmol/L 106 106 109  CO2 22 - 32 mmol/L _1 Calcium 8.9 - 10.3 mg/dL 8.7(L) 8.6(L) 8.4(L)  Total Protein 6.5 - 8.1 g/dL 7.1 7.0 6.9  Total Bilirubin 0.3 - 1.2 mg/dL 0.5 0.3 0.2(L)  Alkaline Phos 38 - 126 U/L 108 100 80  AST 15 - 41 U/L 18 14(L) 11(L)  ALT 0 - 44 U/L _2 RADIOGRAPHIC STUDIES: I have personally reviewed the radiological images as listed and agreed with the findings in the report. No results found.   ASSESSMENT & PLAN:  Sheila [name] is a 43 y.o. female with   1. Malignant neoplasm of upper-outer quadrant of right breast in female, mixed invasive ductal and lobular carcinoma, pT3(m)N2aM0, stage 1b, ductal carcinoma triple positive, lobular carcinoma ER+/PR+/HER2-, Grade 2 -She was diagnosed in 05/2017. She is s/pb/lmastectomy, adjuvant TCHP and radiationandmaintenanceHerceptin/Perjeta.  -Her genetic testing was negative. -She started adjuvant letrozole with monthlyZoladexinjections in 04/2018. Letrozole was subsequently changed to anastrozole, then exemestane due to some joint and back pain. She tolerates exemestane well lately. -She has cancelled her BSO for now,and wants to have the surgeryin 2021. Shewill continue Zoladex injectionsmonthly. -She started Neratinib 229m daily on 03/15/19.plan for one year   -She is clinically doing well and stable. She only has residual tingling in hands occasionally and improved chronic back pain. She  still has moderate hot flashes from AI and Injection, continue Effexor. She tolerated Neratinib well. Physical exam unremarkable. Labs reviewed, BG 172, Ca 8.7, Cr 1.26 otherwise WNL. Will proceed with Zoladex injection today  -I encouraged her to start low dose calcium twice a week. She is agreeable.  She had a history of kidney stone. -port removed in 08/2019 -Continue Exemestane and Neratinib -f/u in 3 months  -She will proceed with flu shot today.   2.history of vaginal bleeding -Started 04/12/19 with cramping, light to moderate flow, fist time since she started Zoladex injections on 03/20/18.Has not recurred.  -I checked her estradiol levelwas less  than 5,inpostmenopausalrange.  -We previously discussed other possibility of postmenopausal bleeding, may need a vaginal [name].Sheis still trying tofind Gyn to follow up with.  -She still plans to wait until 2021 to proceed with BSO. -Her Gyn has left practice. I recommend our Gyn here such as Dr. Denman George once she is ready to proceed with surgery or if she has any concerns.   3. Hot flash -Mostly tolerable. ContinueEffexor262m daily. -Will monitor on exemestane  4. Bone Health -Continue Vitamin D supplement and Vitamin C rich food -We will order a bone density scan in 2021  5. DiffuseBack pain -Her CT Chest on 04/06/18 showed sclerotic lesions of spine in T3 and L1, buther bone scan in 07/2018 was negative  -Letrozole was changed to anastrozole in September 2019due to arthralgia. Given her back pain did improve off Anastrozole, I switched her to Exemestane (04/23/19) -MRI lumbar from 01/28/19 shows lesions of L1 and L2indeterminate, will continue follow up -She will continueFlexeril and aleve for pain.Pain much improved lately and manageable.       PLAN: -Proceed with Zoladex today and continue monthly  -Lab and f/u in 3 months  -Continue Exemestane and Neratinib -she will get flu shot today    No  problem-specific Assessment & Plan notes found for this encounter.   No orders of the defined types were placed in this encounter.  All questions were answered. The patient knows to call the clinic with any problems, questions or concerns. No barriers to learning was detected. I spent 15 minutes counseling the patient face to face. The total time spent in the appointment was 20 minutes and more than 50% was on counseling and review of test results     YTruitt Merle MD 11/04/2019   I, AJoslyn Devon am acting as scribe for YTruitt Merle MD.   I have reviewed the above documentation for accuracy and completeness, and I agree with the above.

## 2019-11-04 ENCOUNTER — Inpatient Hospital Stay: Payer: BC Managed Care – PPO

## 2019-11-04 ENCOUNTER — Inpatient Hospital Stay: Payer: BC Managed Care – PPO | Attending: Nurse Practitioner

## 2019-11-04 ENCOUNTER — Inpatient Hospital Stay (HOSPITAL_BASED_OUTPATIENT_CLINIC_OR_DEPARTMENT_OTHER): Payer: BC Managed Care – PPO | Admitting: Hematology

## 2019-11-04 ENCOUNTER — Other Ambulatory Visit: Payer: Self-pay | Admitting: Hematology

## 2019-11-04 ENCOUNTER — Encounter: Payer: Self-pay | Admitting: Hematology

## 2019-11-04 ENCOUNTER — Other Ambulatory Visit: Payer: Self-pay

## 2019-11-04 VITALS — BP 97/61 | HR 130 | Temp 98.2°F | Resp 18 | Ht 66.0 in | Wt 183.2 lb

## 2019-11-04 DIAGNOSIS — Z923 Personal history of irradiation: Secondary | ICD-10-CM | POA: Diagnosis not present

## 2019-11-04 DIAGNOSIS — Z9221 Personal history of antineoplastic chemotherapy: Secondary | ICD-10-CM | POA: Diagnosis not present

## 2019-11-04 DIAGNOSIS — Z79811 Long term (current) use of aromatase inhibitors: Secondary | ICD-10-CM | POA: Diagnosis not present

## 2019-11-04 DIAGNOSIS — Z79899 Other long term (current) drug therapy: Secondary | ICD-10-CM | POA: Diagnosis not present

## 2019-11-04 DIAGNOSIS — Z23 Encounter for immunization: Secondary | ICD-10-CM | POA: Diagnosis not present

## 2019-11-04 DIAGNOSIS — M549 Dorsalgia, unspecified: Secondary | ICD-10-CM | POA: Insufficient documentation

## 2019-11-04 DIAGNOSIS — Z95828 Presence of other vascular implants and grafts: Secondary | ICD-10-CM

## 2019-11-04 DIAGNOSIS — I429 Cardiomyopathy, unspecified: Secondary | ICD-10-CM | POA: Insufficient documentation

## 2019-11-04 DIAGNOSIS — Z170 Estrogen receptor positive status [ER+]: Secondary | ICD-10-CM | POA: Diagnosis not present

## 2019-11-04 DIAGNOSIS — R232 Flushing: Secondary | ICD-10-CM | POA: Insufficient documentation

## 2019-11-04 DIAGNOSIS — C50411 Malignant neoplasm of upper-outer quadrant of right female breast: Secondary | ICD-10-CM

## 2019-11-04 DIAGNOSIS — R42 Dizziness and giddiness: Secondary | ICD-10-CM | POA: Diagnosis not present

## 2019-11-04 LAB — COMPREHENSIVE METABOLIC PANEL
ALT: 19 U/L (ref 0–44)
AST: 18 U/L (ref 15–41)
Albumin: 3.9 g/dL (ref 3.5–5.0)
Alkaline Phosphatase: 108 U/L (ref 38–126)
Anion gap: 11 (ref 5–15)
BUN: 10 mg/dL (ref 6–20)
CO2: 22 mmol/L (ref 22–32)
Calcium: 8.7 mg/dL — ABNORMAL LOW (ref 8.9–10.3)
Chloride: 106 mmol/L (ref 98–111)
Creatinine, Ser: 1.26 mg/dL — ABNORMAL HIGH (ref 0.44–1.00)
GFR calc Af Amer: >60 mL/min (ref >60)
GFR calc non Af Amer: 53 mL/min — ABNORMAL LOW (ref >60)
Glucose, Bld: 172 mg/dL — ABNORMAL HIGH (ref 70–99)
Potassium: 4 mmol/L (ref 3.5–5.1)
Sodium: 139 mmol/L (ref 135–145)
Total Bilirubin: 0.5 mg/dL (ref 0.3–1.2)
Total Protein: 7.1 g/dL (ref 6.5–8.1)

## 2019-11-04 LAB — CBC WITH DIFFERENTIAL/PLATELET
Abs Immature Granulocytes: 0.01 10*3/uL (ref 0.00–0.07)
Basophils Absolute: 0.1 10*3/uL (ref 0.0–0.1)
Basophils Relative: 1 %
Eosinophils Absolute: 0.2 10*3/uL (ref 0.0–0.5)
Eosinophils Relative: 3 %
HCT: 37.6 % (ref 36.0–46.0)
Hemoglobin: 12.3 g/dL (ref 12.0–15.0)
Immature Granulocytes: 0 %
Lymphocytes Relative: 24 %
Lymphs Abs: 1.2 10*3/uL (ref 0.7–4.0)
MCH: 28.6 pg (ref 26.0–34.0)
MCHC: 32.7 g/dL (ref 30.0–36.0)
MCV: 87.4 fL (ref 80.0–100.0)
Monocytes Absolute: 0.3 10*3/uL (ref 0.1–1.0)
Monocytes Relative: 6 %
Neutro Abs: 3.5 10*3/uL (ref 1.7–7.7)
Neutrophils Relative %: 66 %
Platelets: 236 10*3/uL (ref 150–400)
RBC: 4.3 MIL/uL (ref 3.87–5.11)
RDW: 13.4 % (ref 11.5–15.5)
WBC: 5.2 10*3/uL (ref 4.0–10.5)
nRBC: 0 % (ref 0.0–0.2)

## 2019-11-04 LAB — TSH: TSH: 4.155 u[IU]/mL — ABNORMAL HIGH (ref 0.308–3.960)

## 2019-11-04 MED ORDER — GOSERELIN ACETATE 3.6 MG ~~LOC~~ IMPL
DRUG_IMPLANT | SUBCUTANEOUS | Status: AC
  Filled 2019-11-04: qty 3.6

## 2019-11-04 MED ORDER — GOSERELIN ACETATE 3.6 MG ~~LOC~~ IMPL
3.6000 mg | DRUG_IMPLANT | Freq: Once | SUBCUTANEOUS | Status: AC
  Administered 2019-11-04: 3.6 mg via SUBCUTANEOUS

## 2019-11-04 MED ORDER — INFLUENZA VAC SPLIT QUAD 0.5 ML IM SUSY
PREFILLED_SYRINGE | INTRAMUSCULAR | Status: AC
  Filled 2019-11-04: qty 0.5

## 2019-11-04 MED ORDER — INFLUENZA VAC SPLIT QUAD 0.5 ML IM SUSY
0.5000 mL | PREFILLED_SYRINGE | Freq: Once | INTRAMUSCULAR | Status: AC
  Administered 2019-11-04: 0.5 mL via INTRAMUSCULAR

## 2019-11-05 ENCOUNTER — Telehealth: Payer: Self-pay | Admitting: Hematology

## 2019-11-05 LAB — CANCER ANTIGEN 27.29: CA 27.29: 9.7 U/mL (ref 0.0–38.6)

## 2019-11-05 NOTE — Telephone Encounter (Signed)
Scheduled appt per 12/17 los.  Spoke with pt and she is aware of the appt date and time 

## 2019-11-09 ENCOUNTER — Telehealth: Payer: Self-pay | Admitting: Hematology

## 2019-11-09 NOTE — Telephone Encounter (Signed)
Scheduled appt per 12/21 sch message - unable to reach pt . Left message with appt date and time   

## 2019-12-06 ENCOUNTER — Inpatient Hospital Stay: Payer: BC Managed Care – PPO | Attending: Nurse Practitioner

## 2019-12-06 ENCOUNTER — Other Ambulatory Visit: Payer: Self-pay

## 2019-12-06 ENCOUNTER — Inpatient Hospital Stay: Payer: BC Managed Care – PPO

## 2019-12-06 VITALS — BP 94/40 | HR 119 | Temp 98.5°F | Resp 18

## 2019-12-06 DIAGNOSIS — Z9221 Personal history of antineoplastic chemotherapy: Secondary | ICD-10-CM | POA: Diagnosis not present

## 2019-12-06 DIAGNOSIS — Z170 Estrogen receptor positive status [ER+]: Secondary | ICD-10-CM | POA: Diagnosis not present

## 2019-12-06 DIAGNOSIS — Z79811 Long term (current) use of aromatase inhibitors: Secondary | ICD-10-CM | POA: Insufficient documentation

## 2019-12-06 DIAGNOSIS — C50411 Malignant neoplasm of upper-outer quadrant of right female breast: Secondary | ICD-10-CM | POA: Diagnosis not present

## 2019-12-06 DIAGNOSIS — Z95828 Presence of other vascular implants and grafts: Secondary | ICD-10-CM

## 2019-12-06 DIAGNOSIS — Z923 Personal history of irradiation: Secondary | ICD-10-CM | POA: Diagnosis not present

## 2019-12-06 LAB — COMPREHENSIVE METABOLIC PANEL
ALT: 19 U/L (ref 0–44)
AST: 21 U/L (ref 15–41)
Albumin: 4.1 g/dL (ref 3.5–5.0)
Alkaline Phosphatase: 113 U/L (ref 38–126)
Anion gap: 9 (ref 5–15)
BUN: 7 mg/dL (ref 6–20)
CO2: 24 mmol/L (ref 22–32)
Calcium: 8.9 mg/dL (ref 8.9–10.3)
Chloride: 104 mmol/L (ref 98–111)
Creatinine, Ser: 1.36 mg/dL — ABNORMAL HIGH (ref 0.44–1.00)
GFR calc Af Amer: 56 mL/min — ABNORMAL LOW (ref >60)
GFR calc non Af Amer: 48 mL/min — ABNORMAL LOW (ref >60)
Glucose, Bld: 187 mg/dL — ABNORMAL HIGH (ref 70–99)
Potassium: 4.4 mmol/L (ref 3.5–5.1)
Sodium: 137 mmol/L (ref 135–145)
Total Bilirubin: 0.4 mg/dL (ref 0.3–1.2)
Total Protein: 7.5 g/dL (ref 6.5–8.1)

## 2019-12-06 LAB — CBC WITH DIFFERENTIAL/PLATELET
Abs Immature Granulocytes: 0.01 10*3/uL (ref 0.00–0.07)
Basophils Absolute: 0 10*3/uL (ref 0.0–0.1)
Basophils Relative: 1 %
Eosinophils Absolute: 0.1 10*3/uL (ref 0.0–0.5)
Eosinophils Relative: 3 %
HCT: 37.1 % (ref 36.0–46.0)
Hemoglobin: 12.1 g/dL (ref 12.0–15.0)
Immature Granulocytes: 0 %
Lymphocytes Relative: 27 %
Lymphs Abs: 1.1 10*3/uL (ref 0.7–4.0)
MCH: 28.5 pg (ref 26.0–34.0)
MCHC: 32.6 g/dL (ref 30.0–36.0)
MCV: 87.3 fL (ref 80.0–100.0)
Monocytes Absolute: 0.3 10*3/uL (ref 0.1–1.0)
Monocytes Relative: 6 %
Neutro Abs: 2.7 10*3/uL (ref 1.7–7.7)
Neutrophils Relative %: 63 %
Platelets: 235 10*3/uL (ref 150–400)
RBC: 4.25 MIL/uL (ref 3.87–5.11)
RDW: 13.6 % (ref 11.5–15.5)
WBC: 4.3 10*3/uL (ref 4.0–10.5)
nRBC: 0 % (ref 0.0–0.2)

## 2019-12-06 MED ORDER — GOSERELIN ACETATE 3.6 MG ~~LOC~~ IMPL
3.6000 mg | DRUG_IMPLANT | Freq: Once | SUBCUTANEOUS | Status: AC
  Administered 2019-12-06: 3.6 mg via SUBCUTANEOUS

## 2019-12-06 MED ORDER — GOSERELIN ACETATE 3.6 MG ~~LOC~~ IMPL
DRUG_IMPLANT | SUBCUTANEOUS | Status: AC
  Filled 2019-12-06: qty 3.6

## 2019-12-06 NOTE — Patient Instructions (Signed)

## 2019-12-07 ENCOUNTER — Encounter: Payer: Self-pay | Admitting: Hematology

## 2019-12-07 LAB — CANCER ANTIGEN 27.29: CA 27.29: 9.2 U/mL (ref 0.0–38.6)

## 2019-12-08 ENCOUNTER — Other Ambulatory Visit: Payer: Self-pay | Admitting: Hematology

## 2020-01-04 ENCOUNTER — Other Ambulatory Visit: Payer: Self-pay | Admitting: *Deleted

## 2020-01-04 DIAGNOSIS — C50411 Malignant neoplasm of upper-outer quadrant of right female breast: Secondary | ICD-10-CM

## 2020-01-04 MED ORDER — NERATINIB MALEATE 40 MG PO TABS: ORAL_TABLET | ORAL | 1 refills | Status: DC

## 2020-01-06 ENCOUNTER — Inpatient Hospital Stay: Payer: BC Managed Care – PPO | Attending: Nurse Practitioner

## 2020-01-06 ENCOUNTER — Other Ambulatory Visit: Payer: Self-pay

## 2020-01-06 VITALS — BP 112/72 | HR 112 | Temp 98.2°F | Resp 18

## 2020-01-06 DIAGNOSIS — Z170 Estrogen receptor positive status [ER+]: Secondary | ICD-10-CM | POA: Diagnosis not present

## 2020-01-06 DIAGNOSIS — C50411 Malignant neoplasm of upper-outer quadrant of right female breast: Secondary | ICD-10-CM | POA: Diagnosis not present

## 2020-01-06 DIAGNOSIS — Z79818 Long term (current) use of other agents affecting estrogen receptors and estrogen levels: Secondary | ICD-10-CM | POA: Diagnosis not present

## 2020-01-06 DIAGNOSIS — Z923 Personal history of irradiation: Secondary | ICD-10-CM | POA: Diagnosis not present

## 2020-01-06 DIAGNOSIS — Z9221 Personal history of antineoplastic chemotherapy: Secondary | ICD-10-CM | POA: Insufficient documentation

## 2020-01-06 DIAGNOSIS — Z95828 Presence of other vascular implants and grafts: Secondary | ICD-10-CM

## 2020-01-06 MED ORDER — GOSERELIN ACETATE 3.6 MG ~~LOC~~ IMPL
DRUG_IMPLANT | SUBCUTANEOUS | Status: AC
  Filled 2020-01-06: qty 3.6

## 2020-01-06 MED ORDER — GOSERELIN ACETATE 3.6 MG ~~LOC~~ IMPL
3.6000 mg | DRUG_IMPLANT | Freq: Once | SUBCUTANEOUS | Status: AC
  Administered 2020-01-06: 14:00:00 3.6 mg via SUBCUTANEOUS

## 2020-01-06 NOTE — Patient Instructions (Signed)

## 2020-01-16 DIAGNOSIS — Z20828 Contact with and (suspected) exposure to other viral communicable diseases: Secondary | ICD-10-CM | POA: Diagnosis not present

## 2020-01-16 DIAGNOSIS — Z03818 Encounter for observation for suspected exposure to other biological agents ruled out: Secondary | ICD-10-CM | POA: Diagnosis not present

## 2020-01-26 NOTE — Progress Notes (Signed)
Sheila [name]   [phone] [phone]   Clinic Follow up Note   Patient Care Team: [name], MD as PCP - General (Family Medicine) [name] (Family Medicine) [name], MD as Consulting Physician (General Surgery) [name], MD as Consulting Physician (Hematology) Kyung Rudd, MD as Consulting Physician (Radiation Oncology)  Date of Service:  02/03/2020  CHIEF COMPLAINT: F/u of right breast cancer  SUMMARY OF ONCOLOGIC HISTORY: Oncology History Overview Note   Cancer Staging Malignant neoplasm of upper-outer quadrant of right breast in female, estrogen receptor positive ([place]) Staging form: Breast, AJCC 8th Edition - Clinical stage from 06/11/2017: Stage IB (cT3, cN0, cM0, G2, ER: Positive, PR: Positive, HER2: Positive) - Signed by [name], MD on 06/16/2017 - Pathologic stage from 07/22/2017: Stage IB (pT3(m), pN2a(sn), cM0, G2, ER: Positive, PR: Positive, HER2: Positive) - Signed by [name], NP on 08/04/2017    Malignant neoplasm of upper-outer quadrant of right breast in female, estrogen receptor positive ([place])  06/11/2017 Initial Biopsy   Diagnosis 06/11/17 Breast, right, needle core biopsy, 11:00 o'clock - INVASIVE DUCTAL CARCINOMA, G2   06/11/2017 Mammogram   [name] and MM Diagnostic Breast Tomo Bilateral 06/11/17 IMPRESSION: 1. Highly suspicious mass within the retroareolar right breast, extending from the 8:00 to 11:00 axes, anterior to posterior depth, extending anteriorly to the nipple with associated nipple retraction, measuring at least 5.5 cm by ultrasound  2. No sonographic evidence of metastatic lymphadenopathy in the right axilla. 3. No evidence of malignancy within the left breast.    06/11/2017 Initial Diagnosis   Malignant neoplasm of upper-outer quadrant of right breast in female, estrogen receptor positive ([place])   06/11/2017 Receptors her2   Estrogen Receptor: 100%, POSITIVE, STRONG STAINING  INTENSITY Progesterone Receptor: 100%, POSITIVE, STRONG STAINING INTENSITY Proliferation Marker Ki67: 12%Proliferation Marker Ki67: 12%  HER2 - **POSITIVE** RATIO OF HER2/CEP17 SIGNALS 2.55 AVERAGE HER2 COPY NUMBER PER CELL 4.85   06/25/2017 Genetic Testing   Negative genetic testing on the 9 gene STAT panel.  The STAT Breast cancer panel offered by [facility] includes sequencing and rearrangement analysis for the following 9 genes:  ATM, BRCA1, BRCA2, CDH1, CHEK2, PALB2, PTEN, STK11 and TP53.   The report date is June 25, 2017.  Negative genetic testing on the common hereditary cancer panel.  The Hereditary Gene Panel offered by [facility] includes sequencing and/or deletion duplication testing of the following 46 genes: APC, ATM, AXIN2, BARD1, BMPR1A, BRCA1, BRCA2, BRIP1, CDH1, CDKN2A (p14ARF), CDKN2A (p16INK4a), CHEK2, CTNNA1, DICER1, EPCAM (Deletion/duplication testing only), GREM1 (promoter region deletion/duplication testing only), KIT, MEN1, MLH1, MSH2, MSH3, MSH6, MUTYH, NBN, NF1, NHTL1, PALB2, PDGFRA, PMS2, POLD1, POLE, PTEN, RAD50, RAD51C, RAD51D, SDHB, SDHC, SDHD, SMAD4, SMARCA4. STK11, TP53, TSC1, TSC2, and VHL.  The following genes were evaluated for sequence changes only: SDHA and HOXB13 c.251G>A variant only.  The report date is June 25, 2017.    07/02/2017 Imaging   CT cAP 07/02/17 IMPRESSION: 1. Subareolar right breast mass. No compelling findings of nodal or metastatic involvement. 2. Old granulomatous disease. 3. There is a 7 mm enhancing or hyperdense focus posteriorly in segment 7 of the liver in the subcapsular region. By virtue of its small size this lesion is technically nonspecific although statistically likely to be a small benign lesions such as flash filling hemangioma. This may warrant surveillance. 4.  Prominent stool throughout the colon favors constipation. 5. Degenerative disc disease and spondylosis at L5-S1 likely causing mild impingement.   07/02/2017 Imaging  Bone scan 07/02/17 IMPRESSION: Today' s exam is negative. I ascribed the tiny focus of activity just proximal to the left antecubital region to injection site.   07/22/2017 Surgery   RIGHT TOTAL MASTECTOMY WITH RIGHT AXILLARY SENTINEL LYMPH NODE BIOPSY, LEFT PROPHYLACTIC MASTECTOM and INSERTION PORT-A-CATH WITH [name] by [name] and [facility]    07/22/2017 Pathology Results   Diagnosis 1. Breast, simple mastectomy, Left - FIBROADENOMA. - NO MALIGNANCY IDENTIFIED. 2. 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 - ADDITIONAL FOCUS OF LOBULAR CARCINOMA, GRADE 2, SPANNING 2.2 CM. - INTERMEDIATE GRADE DUCTAL CARCINOMA IN SITU. - INVASIVE CARCINOMA COMES TO WITHIN 0.2 TO 0.3 CM OF THE DEEP MARGIN, FOCALLY. - TUMOR FOCALLY INVOLVES EPIDERMIS. - LYMPHOVASCULAR INVASION OF DERMAL LYMPHATICS. - ONE OF ONE LYMPH NODES NEGATIVE FOR CARCINOMA (0/1). - SEE ONCOLOGY TABLE. 3. Lymph node, sentinel, biopsy, Right axillary - ONE LYMPH NODE WITH ISOLATED TUMOR CELLS (0/1). 4. Lymph node, sentinel, biopsy, Right - METASTATIC CARCINOMA IN ONE OF ONE LYMPH NODES (1/1). 5. Lymph node, sentinel, biopsy, Right - ONE OF ONE LYMPH NODES NEGATIVE FOR CARCINOMA (0/1). 6. Lymph node, sentinel, biopsy, Right - METASTATIC CARCINOMA IN ONE OF ONE LYMPH NODES (1/1). - EXTRACAPSULAR EXTENSION. 7. Lymph node, sentinel, biopsy, Right - METASTATIC CARCINOMA IN ONE OF ONE LYMPH NODES (1/1). - EXTRACAPSULAR EXTENSION. 8. Lymph node, sentinel, biopsy, Right - ONE OF ONE LYMPH NODES NEGATIVE FOR CARCINOMA (0/1). 9. Lymph node, sentinel, biopsy, Right - ONE OF ONE LYMPH NODES NEGATIVE FOR CARCINOMA (0/1). 10. Lymph node, sentinel, biopsy, Right - METASTATIC CARCINOMA IN ONE OF ONE LYMPH NODES (1/1). - EXTRACAPSULAR EXTENSION. 11. Lymph node, biopsy, Right axillary - ONE OF ONE LYMPH NODES NEGATIVE FOR CARCINOMA (0/1).    07/22/2017 Receptors her2    Ductal carcinoma in primary breast tumor and node metastasis are ER 95%, PR 95% strongly positive, and HER2 + The lobular component in primary breast tumor is ER 95% positive, PR 95% positive, strong staining, HER-2 negative.   Original biopsy [id number]: Ductal morphology, Her2 FISH positive. Current specimen: Main focus is 75% ductal morphology, 25% lobular morphology. Smaller focus is lobular morphology. #2A Lobular morphology, Her IHC negative. #2E Lobular morphology, Her2 FISH negative, Her2 IHC negative. #4 50% lobular and 50% ductal morphology, Her2 FISH positive, Her2 IHC negative. #6 Ductal morphology #7 Ductal morphology #10 50% lobular, 50% ductal morphology   08/25/2017 Imaging   [facility] abdomen [facility]:  Enhancing structure measing 5 mm is compatible with a flash filling hemangioma. No specific findings to suggest metastatic cancer   08/25/2017 Imaging   MRI Abdomen at [facility] 08/25/17  IMPRESSION: 1. Lesion in segment 7 of the liver has signal and enhancement characteristics compatible with a flash filling hemangioma. No specific findings identified to suggest metastatic disease.   08/29/2017 - 12/12/2017 Chemotherapy   adjuvant TCHP every 3 weeks for 6 cycles starting 08/29/17. Postponed and decreased Carbo to 622m and added Onpro with cycle 2 on 09/19/17 due to neutropenic fever. Carbplatin further reduced to 5087mstarting with cycle 3 due to thrombocytopenia. Herceptin was held for Cycle 4 and 5 due to worsening global longitudinal strain found on ECHO from 11/07/17. Completed 6 cycles on 12/12/17      09/04/2017 - 09/09/2017 Hospital Admission   Admit date: 09/04/2017 Discharge date: 09/09/2017  DISCHARGE DIAGNOSES:  Principal Problem:   [facility]:   Malignant neoplasm of upper-outer quadrant of right breast in female,  estrogen receptor positive ([place])   Port-A-Cath in place   Neutropenic fever ([place])   Hyponatremia   Malignant neoplasm of  breast in female, estrogen receptor positive ([place])    12/28/2017 Imaging   CT CHEST IMPRESSION: 1. 2.3 x 2.7 x 1.2 cm irregular masslike opacity within the right middle lobe worrisome for malignancy/metastasis. 2. 3 new very small sclerotic lesions, within the sternum and the body of T4, worrisome for new metastases.   12/28/2017 - 01/05/2018 Hospital Admission   Admit date: 12/28/17-01/05/18 Admission diagnosis: multifocal pneumonia, influenza A, sepsis, hypoxia Additional comments: imaging during hospitalization notable for new irregular masslike opacity in the right middle lobe worrisome for metastasis and new very small sclerotic lesion within the sternum and the body of T4, worrisome for metastasis   12/29/2017 Imaging   CT ABD/PELVIS IMPRESSION: No definite acute intra-abdominal or intrapelvic abnormalities.  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   12/30/2017 Imaging   CT HEAD IMPRESSION: No acute intracranial abnormality or enhancing lesion.   01/16/2018 - 01/22/2019 Chemotherapy   Maintence Herceptin and Perjeta every 3 weeks for 6-12 months starting 01/16/18. Held 03/2018-07/17/18 due to low EF. Completed on 01/22/19    02/04/2018 PET scan   IMPRESSION: 1. The previous consolidation in the right middle lobe currently has a more nodular and reticular appearance with some associated mild atelectasis along its anterior margin. This demonstrates low-grade metabolic activity with maximum SUV 3.2 (background blood pool activity is 2.7). Although conceivably related to low-grade residuum from the patient's malignancy, the presence of calcified right hilar lymph nodes and small calcifications along the nodularity raise suspicion for a low-grade active granulomatous process or atypical infectious process. Surveillance is recommended. 2. Scattered deposits of hypermetabolic but benign metabolically active brown fat.   02/23/2018 - 04/09/2018 Radiation  Therapy   Radiation treatment dates:   02/23/2018 - 04/09/2018  Site/dose:   The patient initially received a dose of 50.4 Gy in 28 fractions to the right chest wall and supraclavicular region. This was delivered using a 3-D conformal, 4 field technique. The patient then received a boost to the mastectomy scar. This delivered an additional 10 Gy in 5 fractions using an en face electron field. The total dose was 60.4 Gy.  Narrative: The patient tolerated radiation treatment relatively well.   The patient had some expected skin irritation as she progressed during treatment. Moist desquamation was not present at the end of treatment.   03/20/2018 -  Anti-estrogen oral therapy   -Monthly Zoladex injection starting 03/20/18  -Letrozole 2.82m once dialy starting 05/03/18  -Switched to Anastrozole starting 07/2018 Due to her worsening back pain. -Changed to Exemestane in 04/2019 due to back pain exacerbation.    04/06/2018 Imaging   CT Chest wo contrast IMPRESSION: 1. Stable linear nodular thickening in the RIGHT middle lobe is favored post infectious or inflammatory scarring. 2. Calcified RIGHT hilar lymph node is consistent post infectious process. 3. Stable small LEFT upper lobe nodule. 4. Stable sclerotic lesion in the manubrium. Sclerotic lesion at T3 is more prominent (4 mm). Sclerotic lesion at L 1 is new from 07/02/2017.   04/06/2018 Imaging   04/06/2018 CT Chest IMPRESSION: 1. Stable linear nodular thickening in the RIGHT middle lobe is favored post infectious or inflammatory scarring. 2. Calcified RIGHT hilar lymph node is consistent post infectious process. 3. Stable small LEFT upper lobe nodule. 4. Stable sclerotic lesion in the manubrium. Sclerotic lesion at T3 is more prominent (4 mm).  Sclerotic lesion at L 1 is new from 07/02/2017.   04/10/2018 Echocardiogram   LV EF 45%-50%   05/25/2018 Echocardiogram   LV EF 45%-50%   07/13/2018 Echocardiogram   Normal LV size with EF  55-60%   07/31/2018 Imaging   07/31/2018 Bone Scan IMPRESSION: No evidence of skeletal metastatic disease.   07/31/2018 Imaging   07/31/2018 CT CAP IMPRESSION: Residual linear and nodular peribronchovascular opacities in the right middle lobe, with other scattered areas of ground-glass opacities in the right middle, right upper and right lower lobes, which are located within the previously demonstrated bulky airspace consolidation. The findings are improved from the chest CT dated 12/30/2017, and relatively stable from PET-CT dated 02/04/2018. These may represent post infectious/post inflammatory changes or residual malignancy.  Stable partially calcified right hilar lymphadenopathy.  Interval development of nodular soft tissue thickening within the anterior mediastinum in the area of the thymus, new from chest CT dated 12/30/2017. This may represent reactive thymic tissue, thymoma or lymphadenopathy.  1.8 cm probably physiologic left ovarian cyst. Borderline thickening of the endometrium measuring 15.6 mm. Further evaluation with pelvic ultrasound may be considered.  No new or suspicious findings within the abdomen.   09/15/2018 Echocardiogram   EF 55-60%   01/28/2019 Imaging   MRI Lumbar spie 01/28/19  IMPRESSION: 1. Subcentimeter lesions in the L1 and L2 vertebral bodies, indeterminate for metastases. 2. Mild lower lumbar disc degeneration without stenosis.   01/28/2019 Imaging   CT chest 01/28/19  IMPRESSION: Right lung post radiation changes. No active lung disease or other acute findings.  No evidence of recurrent or metastatic carcinoma within the thorax.   03/15/2019 - 02/2020 Chemotherapy   Anti-HER2 Neratinib 240 mg daily, starting 03/15/19. Plan to complete in 02/2020      CURRENT THERAPY:  -Monthly Zoladex injections starting 03/20/18  -Letrozole 2.23m once daily starting 05/03/18, changed to anastrozole in 07/2018. Changed to Exemestane in 04/2019 due to  back pain exacerbation. -Neratinib 240 mg daily, starting 03/15/19. Plan to complete in 02/2020   INTERVAL HISTORY:  Sheila [name] here for a follow up of ongoing treatment. She presents to the clinic alone. She notes she is doing well. 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 She notes she occasionally will feel dizzy but notes she is drinking enough water. She notes her body, mainly her knees feel week. She notes she has not been exercising as much as she should. She notes she is tolerating Neratinib and exemestane well. Her back pain is manageable. She denies recent bleeding.    REVIEW OF SYSTEMS:   Constitutional: Denies fevers, chills or abnormal weight loss (+) Weakness  Eyes: Denies blurriness of vision Ears, nose, mouth, throat, and face: Denies mucositis or sore throat Respiratory: Denies cough, dyspnea or wheezes Cardiovascular: Denies palpitation, chest discomfort or lower extremity swelling Gastrointestinal:  Denies nausea, heartburn or change in bowel habits Skin: Denies abnormal skin rashes MSK: (+) manageable back pain and weakness in legs Lymphatics: Denies new lymphadenopathy or easy bruising Neurological:Denies numbness, tingling or new weaknesses Behavioral/Psych: Mood is stable, no new changes  All other systems were reviewed with the patient and are negative.  MEDICAL HISTORY:  Past Medical History:  Diagnosis Date  . Anxiety   . Cancer ([facility]    right breast   . Cardiomyopathy ([place]   . Family history of breast cancer   . Family history of colon cancer   . GERD (gastroesophageal reflux disease)   . Headache  couple of migraines in the past  . History of kidney stones   . Vitamin D deficiency     SURGICAL HISTORY: Past Surgical History:  Procedure Laterality Date  . egg donation    . kidney stone removal    . MASS EXCISION Right 08/19/2019   Procedure: REMOVE EXCESS TISSUE FROM RIGHT LATERAL MASTECTOMY SCAR;  Surgeon: [name],  MD;  Location: [place];  Service: General;  Laterality: Right;  . MASTECTOMY W/ SENTINEL NODE BIOPSY Bilateral 07/22/2017   Procedure: RIGHT TOTAL MASTECTOMY WITH RIGHT AXILLARY SENTINEL LYMPH NODE BIOPSY, LEFT PROPHYLACTIC MASTECTOMY;  Surgeon: [name], MD;  Location: [place];  Service: General;  Laterality: Bilateral;  . PORT-A-CATH REMOVAL Right 08/19/2019   Procedure: REMOVAL PORT-A-CATH;  Surgeon: [name], MD;  Location: [place];  Service: General;  Laterality: Right;  . PORTACATH PLACEMENT Right 07/22/2017   Procedure: INSERTION PORT-A-CATH WITH [name];  Surgeon: [name], MD;  Location: [place];  Service: General;  Laterality: Right;    I have reviewed the social history and family history with the patient and they are unchanged from previous note.  ALLERGIES:  has No Known Allergies.  MEDICATIONS:  Current Outpatient Medications  Medication Sig Dispense Refill  . B Complex-C (B-COMPLEX WITH VITAMIN C) tablet Take 1 tablet by mouth daily.    . calcium-vitamin D (OSCAL WITH D) 250-125 MG-UNIT tablet Take 1 tablet by mouth daily.    . Cholecalciferol (VITAMIN D) 2000 units tablet Take 2,000 Units by mouth daily.    . cyclobenzaprine (FLEXERIL) 5 MG tablet Take 1 tablet (5 mg total) by mouth 2 (two) times daily as needed for muscle spasms. 30 tablet 0  . diphenoxylate-atropine (LOMOTIL) 2.5-0.025 MG tablet Take 1-2 tablets by mouth 4 (four) times daily as needed for diarrhea or loose stools. 30 tablet 3  . exemestane (AROMASIN) 25 MG tablet TAKE 1 TABLET (25 MG TOTAL) BY MOUTH DAILY AFTER BREAKFAST. 90 tablet 1  . fluticasone (FLONASE) 50 MCG/ACT nasal spray Place into both nostrils daily.    Marland Kitchen levothyroxine (SYNTHROID, LEVOTHROID) 25 MCG tablet Take 25 mcg by mouth daily before breakfast.    . loperamide (IMODIUM) 2 MG capsule Take 1-2 capsules (2-4 mg total) by mouth 3 (three) times daily as needed for diarrhea or loose stools. 120  capsule 0  . Melatonin 10 MG TABS Take 0.5 tablets by mouth.     . naproxen sodium (ANAPROX) 220 MG tablet Take 220 mg 2 (two) times daily as needed by mouth (pain).     . Neratinib Maleate (NERLYNX) 40 MG tablet Take 6 tabs (256m) once daily. Take w food 180 tablet 1  . prochlorperazine (COMPAZINE) 10 MG tablet Take 1 tablet (10 mg total) by mouth every 6 (six) hours as needed (Nausea or vomiting). 45 tablet 3  . venlafaxine XR (EFFEXOR-XR) 75 MG 24 hr capsule TAKE 3 CAPSULES BY MOUTH EVERY DAY WITH BREAKFAST 90 capsule 5   Current Facility-Administered Medications  Medication Dose Route Frequency Provider Last Rate Last Admin  . gi cocktail (Maalox,Lidocaine,Donnatal)  30 mL Oral Once THarle Stanford, PA-C        PHYSICAL EXAMINATION: ECOG PERFORMANCE STATUS: 1 - Symptomatic but completely ambulatory  Vitals:   02/03/20 1406  BP: 121/83  Pulse: 99  Resp: 18  Temp: 98.5 F (36.9 C)  SpO2: 100%   Filed Weights   02/03/20 1406  Weight: 185 lb 3.2 oz (84 kg)   GENERAL:alert, no  distress and comfortable SKIN: skin color, texture, turgor are normal, no rashes or significant lesions LEG: no edema  NEURO: alert & oriented x 3 with fluent speech, no focal motor/sensory deficits  LABORATORY DATA:  I have reviewed the data as listed CBC Latest Ref Rng & Units 02/03/2020 12/06/2019 11/04/2019  WBC 4.0 - 10.5 K/uL 4.1 4.3 5.2  Hemoglobin 12.0 - 15.0 g/dL 10.9(L) 12.1 12.3  Hematocrit 36.0 - 46.0 % 34.2(L) 37.1 37.6  Platelets 150 - 400 K/uL 232 235 236     CMP Latest Ref Rng & Units 02/03/2020 12/06/2019 11/04/2019  Glucose 70 - 99 mg/dL 133(H) 187(H) 172(H)  BUN 6 - 20 mg/dL '10 7 10  ' Creatinine 0.44 - 1.00 mg/dL 1.19(H) 1.36(H) 1.26(H)  Sodium 135 - 145 mmol/L 140 137 139  Potassium 3.5 - 5.1 mmol/L 3.9 4.4 4.0  Chloride 98 - 111 mmol/L 106 104 106  CO2 22 - 32 mmol/L '24 24 22  ' Calcium 8.9 - 10.3 mg/dL 8.8(L) 8.9 8.7(L)  Total Protein 6.5 - 8.1 g/dL 7.2 7.5 7.1  Total Bilirubin  0.3 - 1.2 mg/dL 0.3 0.4 0.5  Alkaline Phos 38 - 126 U/L 98 113 108  AST 15 - 41 U/L '17 21 18  ' ALT 0 - 44 U/L '16 19 19      ' RADIOGRAPHIC STUDIES: I have personally reviewed the radiological images as listed and agreed with the findings in the report. No results found.   ASSESSMENT & PLAN:  Sheila [name] is a 44 y.o. female with    1. Malignant neoplasm of upper-outer quadrant of right breast in female, mixed invasive ductal and lobular carcinoma, pT3(m)N2aM0, stage 1b, ductal carcinoma triple positive, lobular carcinoma ER+/PR+/HER2-, Grade 2 -She was diagnosed in 05/2017. She is s/pb/lmastectomy, adjuvant TCHP and radiationandmaintenanceHerceptin/Perjeta.  -Her genetic testing was negative. -Shestarted adjuvant letrozolewith monthlyZoladexinjectionsin 04/2018.Letrozole was subsequently changed to anastrozole, then exemestane due to some joint and back pain. She toleratesexemestane well lately. -She has cancelled her BSO for now,and wants to have the surgeryin 2021. Shewill continue Zoladex injectionsmonthly. -She started Neratinib 243m daily on 4/27/20plan for one year   -She had PAC removed.  -She is clinically stable. She has been experiencing weakness and occasional dizziness. 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 Her back pain is manageable. She is tolerating Neratinib and Exemestane well.  -Labs reviewed,  CBC and CMP WNL except Hg 10.9, BG 133, Cr 1.19, Ca 8.8. She can start pre-natal vitamins and continue to drink plenty of water. Will check her iron panel and TSH level at next visit. Overall adequate to continue Exemestane with monthly Zoladex injections and Neratinib. Plan to complete Neratinib in by the end of April 2021 -F/u in 3 months then 4-6 months.   2.H/o vaginal bleeding -Started 04/12/19 with cramping, light to moderate flow, fist time since she started Zoladex injections on 03/20/18.Has not recurred.  -I  checked her estradiol levelwas less than 5,inpostmenopausalrange.  -We previously discussed other possibility of postmenopausal bleeding, may need a vaginal [place] still trying tofind Gyn to follow up with. I recommended our Gyn here such as Dr. RDenman Georgeonce she is ready to proceed with surgery or if she has any concerns.  -She is still considering proceeding with BSO.  3. Hot flash -Mostly tolerable. ContinueEffexor2222mdaily. -Will monitor on exemestane  4. Bone Health -Continue Vitamin D supplement and Vitamin C rich food -We will order a bone density scan in 2021  5.  Back pain -Her CT Chest on 04/06/18 showed sclerotic lesions of spine in T3 and L1, buther bone scan in 07/2018 was negative  -MRI lumbar from 01/28/19 shows lesions of L1 and L2indeterminate, will continue follow up -She will continueFlexeril and aleve for pain. -Pain much improved lately and manageable on Exemestane   6. Anemia  -Hg 10.9 today, new, MCV normal -No clinical signs of bleeding -We will check iron study, B12, folate, and TSH on next visit -I recommended her to try multivitamins including iron   PLAN: -Proceed with Zoladex today and continue monthly  -Lab and f/u in 3 months  -Continue Exemestane and Neratinib, complete Neratinib by the end of April 2021   No problem-specific Assessment & Plan notes found for this encounter.   Orders Placed This Encounter  Procedures  . TSH    Standing Status:   Future    Standing Expiration Date:   02/02/2021  . Iron and TIBC    Standing Status:   Future    Standing Expiration Date:   02/02/2021  . Ferritin    Standing Status:   Future    Standing Expiration Date:   02/02/2021  . Folate RBC    Standing Status:   Future    Standing Expiration Date:   02/02/2021  . Vitamin B12    Standing Status:   Future    Standing Expiration Date:   02/02/2021   All questions were answered. The patient knows to call the clinic with any problems,  questions or concerns. No barriers to learning was detected.     [name], MD 02/03/2020   I, Joslyn Devon, am acting as scribe for [name], MD.   I have reviewed the above documentation for accuracy and completeness, and I agree with the above.

## 2020-02-03 ENCOUNTER — Encounter: Payer: Self-pay | Admitting: Hematology

## 2020-02-03 ENCOUNTER — Inpatient Hospital Stay: Payer: BC Managed Care – PPO | Attending: Nurse Practitioner

## 2020-02-03 ENCOUNTER — Inpatient Hospital Stay (HOSPITAL_BASED_OUTPATIENT_CLINIC_OR_DEPARTMENT_OTHER): Payer: BC Managed Care – PPO | Admitting: Hematology

## 2020-02-03 ENCOUNTER — Inpatient Hospital Stay: Payer: BC Managed Care – PPO

## 2020-02-03 ENCOUNTER — Other Ambulatory Visit: Payer: Self-pay

## 2020-02-03 VITALS — BP 121/83 | HR 99 | Temp 98.5°F | Resp 18 | Ht 66.0 in | Wt 185.2 lb

## 2020-02-03 DIAGNOSIS — Z79818 Long term (current) use of other agents affecting estrogen receptors and estrogen levels: Secondary | ICD-10-CM | POA: Insufficient documentation

## 2020-02-03 DIAGNOSIS — C50411 Malignant neoplasm of upper-outer quadrant of right female breast: Secondary | ICD-10-CM

## 2020-02-03 DIAGNOSIS — M549 Dorsalgia, unspecified: Secondary | ICD-10-CM | POA: Diagnosis not present

## 2020-02-03 DIAGNOSIS — Z170 Estrogen receptor positive status [ER+]: Secondary | ICD-10-CM | POA: Insufficient documentation

## 2020-02-03 DIAGNOSIS — Z923 Personal history of irradiation: Secondary | ICD-10-CM | POA: Diagnosis not present

## 2020-02-03 DIAGNOSIS — R531 Weakness: Secondary | ICD-10-CM | POA: Diagnosis not present

## 2020-02-03 DIAGNOSIS — D649 Anemia, unspecified: Secondary | ICD-10-CM | POA: Insufficient documentation

## 2020-02-03 DIAGNOSIS — Z95828 Presence of other vascular implants and grafts: Secondary | ICD-10-CM

## 2020-02-03 DIAGNOSIS — Z9013 Acquired absence of bilateral breasts and nipples: Secondary | ICD-10-CM | POA: Diagnosis not present

## 2020-02-03 DIAGNOSIS — F419 Anxiety disorder, unspecified: Secondary | ICD-10-CM | POA: Insufficient documentation

## 2020-02-03 DIAGNOSIS — Z9221 Personal history of antineoplastic chemotherapy: Secondary | ICD-10-CM | POA: Diagnosis not present

## 2020-02-03 DIAGNOSIS — R42 Dizziness and giddiness: Secondary | ICD-10-CM | POA: Insufficient documentation

## 2020-02-03 DIAGNOSIS — M47817 Spondylosis without myelopathy or radiculopathy, lumbosacral region: Secondary | ICD-10-CM | POA: Diagnosis not present

## 2020-02-03 DIAGNOSIS — R232 Flushing: Secondary | ICD-10-CM | POA: Diagnosis not present

## 2020-02-03 DIAGNOSIS — Z79899 Other long term (current) drug therapy: Secondary | ICD-10-CM | POA: Diagnosis not present

## 2020-02-03 LAB — CBC WITH DIFFERENTIAL/PLATELET
Abs Immature Granulocytes: 0 10*3/uL (ref 0.00–0.07)
Basophils Absolute: 0 10*3/uL (ref 0.0–0.1)
Basophils Relative: 1 %
Eosinophils Absolute: 0.2 10*3/uL (ref 0.0–0.5)
Eosinophils Relative: 4 %
HCT: 34.2 % — ABNORMAL LOW (ref 36.0–46.0)
Hemoglobin: 10.9 g/dL — ABNORMAL LOW (ref 12.0–15.0)
Immature Granulocytes: 0 %
Lymphocytes Relative: 32 %
Lymphs Abs: 1.3 10*3/uL (ref 0.7–4.0)
MCH: 27.3 pg (ref 26.0–34.0)
MCHC: 31.9 g/dL (ref 30.0–36.0)
MCV: 85.7 fL (ref 80.0–100.0)
Monocytes Absolute: 0.3 10*3/uL (ref 0.1–1.0)
Monocytes Relative: 8 %
Neutro Abs: 2.3 10*3/uL (ref 1.7–7.7)
Neutrophils Relative %: 55 %
Platelets: 232 10*3/uL (ref 150–400)
RBC: 3.99 MIL/uL (ref 3.87–5.11)
RDW: 13.9 % (ref 11.5–15.5)
WBC: 4.1 10*3/uL (ref 4.0–10.5)
nRBC: 0 % (ref 0.0–0.2)

## 2020-02-03 LAB — COMPREHENSIVE METABOLIC PANEL
ALT: 16 U/L (ref 0–44)
AST: 17 U/L (ref 15–41)
Albumin: 3.8 g/dL (ref 3.5–5.0)
Alkaline Phosphatase: 98 U/L (ref 38–126)
Anion gap: 10 (ref 5–15)
BUN: 10 mg/dL (ref 6–20)
CO2: 24 mmol/L (ref 22–32)
Calcium: 8.8 mg/dL — ABNORMAL LOW (ref 8.9–10.3)
Chloride: 106 mmol/L (ref 98–111)
Creatinine, Ser: 1.19 mg/dL — ABNORMAL HIGH (ref 0.44–1.00)
GFR calc Af Amer: >60 mL/min (ref >60)
GFR calc non Af Amer: 56 mL/min — ABNORMAL LOW (ref >60)
Glucose, Bld: 133 mg/dL — ABNORMAL HIGH (ref 70–99)
Potassium: 3.9 mmol/L (ref 3.5–5.1)
Sodium: 140 mmol/L (ref 135–145)
Total Bilirubin: 0.3 mg/dL (ref 0.3–1.2)
Total Protein: 7.2 g/dL (ref 6.5–8.1)

## 2020-02-03 MED ORDER — GOSERELIN ACETATE 3.6 MG ~~LOC~~ IMPL
DRUG_IMPLANT | SUBCUTANEOUS | Status: AC
  Filled 2020-02-03: qty 3.6

## 2020-02-03 MED ORDER — GOSERELIN ACETATE 3.6 MG ~~LOC~~ IMPL
3.6000 mg | DRUG_IMPLANT | Freq: Once | SUBCUTANEOUS | Status: AC
  Administered 2020-02-03: 3.6 mg via SUBCUTANEOUS

## 2020-02-03 NOTE — Patient Instructions (Signed)

## 2020-02-04 ENCOUNTER — Telehealth: Payer: Self-pay | Admitting: Hematology

## 2020-02-04 NOTE — Telephone Encounter (Signed)
Scheduled appt per 3/18 los.  Spoke with pt and she is aware of the appt date and time.

## 2020-03-06 ENCOUNTER — Other Ambulatory Visit: Payer: Self-pay

## 2020-03-06 ENCOUNTER — Inpatient Hospital Stay: Payer: BC Managed Care – PPO | Attending: Nurse Practitioner

## 2020-03-06 VITALS — BP 118/79 | HR 110 | Temp 98.5°F | Resp 18

## 2020-03-06 DIAGNOSIS — Z170 Estrogen receptor positive status [ER+]: Secondary | ICD-10-CM | POA: Insufficient documentation

## 2020-03-06 DIAGNOSIS — Z79818 Long term (current) use of other agents affecting estrogen receptors and estrogen levels: Secondary | ICD-10-CM | POA: Insufficient documentation

## 2020-03-06 DIAGNOSIS — C50411 Malignant neoplasm of upper-outer quadrant of right female breast: Secondary | ICD-10-CM | POA: Diagnosis not present

## 2020-03-06 DIAGNOSIS — Z95828 Presence of other vascular implants and grafts: Secondary | ICD-10-CM

## 2020-03-06 MED ORDER — GOSERELIN ACETATE 3.6 MG ~~LOC~~ IMPL
3.6000 mg | DRUG_IMPLANT | Freq: Once | SUBCUTANEOUS | Status: AC
  Administered 2020-03-06: 3.6 mg via SUBCUTANEOUS

## 2020-03-06 MED ORDER — GOSERELIN ACETATE 3.6 MG ~~LOC~~ IMPL
DRUG_IMPLANT | SUBCUTANEOUS | Status: AC
  Filled 2020-03-06: qty 3.6

## 2020-03-06 NOTE — Patient Instructions (Signed)

## 2020-04-05 ENCOUNTER — Other Ambulatory Visit: Payer: Self-pay

## 2020-04-05 ENCOUNTER — Inpatient Hospital Stay: Payer: BC Managed Care – PPO | Attending: Nurse Practitioner

## 2020-04-05 VITALS — BP 100/64 | HR 98 | Temp 97.9°F | Resp 18

## 2020-04-05 DIAGNOSIS — Z79818 Long term (current) use of other agents affecting estrogen receptors and estrogen levels: Secondary | ICD-10-CM | POA: Insufficient documentation

## 2020-04-05 DIAGNOSIS — Z170 Estrogen receptor positive status [ER+]: Secondary | ICD-10-CM | POA: Diagnosis not present

## 2020-04-05 DIAGNOSIS — Z95828 Presence of other vascular implants and grafts: Secondary | ICD-10-CM

## 2020-04-05 DIAGNOSIS — C50411 Malignant neoplasm of upper-outer quadrant of right female breast: Secondary | ICD-10-CM

## 2020-04-05 MED ORDER — GOSERELIN ACETATE 3.6 MG ~~LOC~~ IMPL
3.6000 mg | DRUG_IMPLANT | Freq: Once | SUBCUTANEOUS | Status: AC
  Administered 2020-04-05: 3.6 mg via SUBCUTANEOUS

## 2020-04-05 NOTE — Patient Instructions (Signed)

## 2020-04-09 ENCOUNTER — Other Ambulatory Visit: Payer: Self-pay | Admitting: Hematology

## 2020-05-02 NOTE — Progress Notes (Signed)
Sheila [name]   [phone] [phone]   Clinic Follow up Note   Patient Care Team: [name], MD as PCP - General (Family Medicine) [name] (Family Medicine) [name], MD as Consulting Physician (General Surgery) [name], MD as Consulting Physician (Hematology) Kyung Rudd, MD as Consulting Physician (Radiation Oncology)  Date of Service:  05/05/2020  CHIEF COMPLAINT: F/u of right breast cancer  SUMMARY OF ONCOLOGIC HISTORY: Oncology History Overview Note   Cancer Staging Malignant neoplasm of upper-outer quadrant of right breast in female, estrogen receptor positive ([place]) Staging form: Breast, AJCC 8th Edition - Clinical stage from 06/11/2017: Stage IB (cT3, cN0, cM0, G2, ER: Positive, PR: Positive, HER2: Positive) - Signed by [name], MD on 06/16/2017 - Pathologic stage from 07/22/2017: Stage IB (pT3(m), pN2a(sn), cM0, G2, ER: Positive, PR: Positive, HER2: Positive) - Signed by [name], NP on 08/04/2017    Malignant neoplasm of upper-outer quadrant of right breast in female, estrogen receptor positive ([place])  06/11/2017 Initial Biopsy   Diagnosis 06/11/17 Breast, right, needle core biopsy, 11:00 o'clock - INVASIVE DUCTAL CARCINOMA, G2   06/11/2017 Mammogram   [name] and MM Diagnostic Breast Tomo Bilateral 06/11/17 IMPRESSION: 1. Highly suspicious mass within the retroareolar right breast, extending from the 8:00 to 11:00 axes, anterior to posterior depth, extending anteriorly to the nipple with associated nipple retraction, measuring at least 5.5 cm by ultrasound  2. No sonographic evidence of metastatic lymphadenopathy in the right axilla. 3. No evidence of malignancy within the left breast.    06/11/2017 Initial Diagnosis   Malignant neoplasm of upper-outer quadrant of right breast in female, estrogen receptor positive ([place])   06/11/2017 Receptors her2   Estrogen Receptor: 100%, POSITIVE, STRONG STAINING  INTENSITY Progesterone Receptor: 100%, POSITIVE, STRONG STAINING INTENSITY Proliferation Marker Ki67: 12%Proliferation Marker Ki67: 12%  HER2 - **POSITIVE** RATIO OF HER2/CEP17 SIGNALS 2.55 AVERAGE HER2 COPY NUMBER PER CELL 4.85   06/25/2017 Genetic Testing   Negative genetic testing on the 9 gene STAT panel.  The STAT Breast cancer panel offered by [facility] includes sequencing and rearrangement analysis for the following 9 genes:  ATM, BRCA1, BRCA2, CDH1, CHEK2, PALB2, PTEN, STK11 and TP53.   The report date is June 25, 2017.  Negative genetic testing on the common hereditary cancer panel.  The Hereditary Gene Panel offered by [facility] includes sequencing and/or deletion duplication testing of the following 46 genes: APC, ATM, AXIN2, BARD1, BMPR1A, BRCA1, BRCA2, BRIP1, CDH1, CDKN2A (p14ARF), CDKN2A (p16INK4a), CHEK2, CTNNA1, DICER1, EPCAM (Deletion/duplication testing only), GREM1 (promoter region deletion/duplication testing only), KIT, MEN1, MLH1, MSH2, MSH3, MSH6, MUTYH, NBN, NF1, NHTL1, PALB2, PDGFRA, PMS2, POLD1, POLE, PTEN, RAD50, RAD51C, RAD51D, SDHB, SDHC, SDHD, SMAD4, SMARCA4. STK11, TP53, TSC1, TSC2, and VHL.  The following genes were evaluated for sequence changes only: SDHA and HOXB13 c.251G>Sheila variant only.  The report date is June 25, 2017.    07/02/2017 Imaging   CT cAP 07/02/17 IMPRESSION: 1. Subareolar right breast mass. No compelling findings of nodal or metastatic involvement. 2. Old granulomatous disease. 3. There is Sheila 7 mm enhancing or hyperdense focus posteriorly in segment 7 of the liver in the subcapsular region. By virtue of its small size this lesion is technically nonspecific although statistically likely to be Sheila small benign lesions such as flash filling hemangioma. This may warrant surveillance. 4.  Prominent stool throughout the colon favors constipation. 5. Degenerative disc disease and spondylosis at L5-S1 likely causing mild impingement.   07/02/2017 Imaging  Bone scan 07/02/17 IMPRESSION: Today' s exam is negative. I ascribed the tiny focus of activity just proximal to the left antecubital region to injection site.   07/22/2017 Surgery   RIGHT TOTAL MASTECTOMY WITH RIGHT AXILLARY SENTINEL LYMPH NODE BIOPSY, LEFT PROPHYLACTIC MASTECTOM and INSERTION PORT-Sheila-CATH WITH [name] by [name] and [facility]    07/22/2017 Pathology Results   Diagnosis 1. Breast, simple mastectomy, Left - FIBROADENOMA. - NO MALIGNANCY IDENTIFIED. 2. 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 - ADDITIONAL FOCUS OF LOBULAR CARCINOMA, GRADE 2, SPANNING 2.2 CM. - INTERMEDIATE GRADE DUCTAL CARCINOMA IN SITU. - INVASIVE CARCINOMA COMES TO WITHIN 0.2 TO 0.3 CM OF THE DEEP MARGIN, FOCALLY. - TUMOR FOCALLY INVOLVES EPIDERMIS. - LYMPHOVASCULAR INVASION OF DERMAL LYMPHATICS. - ONE OF ONE LYMPH NODES NEGATIVE FOR CARCINOMA (0/1). - SEE ONCOLOGY TABLE. 3. Lymph node, sentinel, biopsy, Right axillary - ONE LYMPH NODE WITH ISOLATED TUMOR CELLS (0/1). 4. Lymph node, sentinel, biopsy, Right - METASTATIC CARCINOMA IN ONE OF ONE LYMPH NODES (1/1). 5. Lymph node, sentinel, biopsy, Right - ONE OF ONE LYMPH NODES NEGATIVE FOR CARCINOMA (0/1). 6. Lymph node, sentinel, biopsy, Right - METASTATIC CARCINOMA IN ONE OF ONE LYMPH NODES (1/1). - EXTRACAPSULAR EXTENSION. 7. Lymph node, sentinel, biopsy, Right - METASTATIC CARCINOMA IN ONE OF ONE LYMPH NODES (1/1). - EXTRACAPSULAR EXTENSION. 8. Lymph node, sentinel, biopsy, Right - ONE OF ONE LYMPH NODES NEGATIVE FOR CARCINOMA (0/1). 9. Lymph node, sentinel, biopsy, Right - ONE OF ONE LYMPH NODES NEGATIVE FOR CARCINOMA (0/1). 10. Lymph node, sentinel, biopsy, Right - METASTATIC CARCINOMA IN ONE OF ONE LYMPH NODES (1/1). - EXTRACAPSULAR EXTENSION. 11. Lymph node, biopsy, Right axillary - ONE OF ONE LYMPH NODES NEGATIVE FOR CARCINOMA (0/1).    07/22/2017 Receptors her2    Ductal carcinoma in primary breast tumor and node metastasis are ER 95%, PR 95% strongly positive, and HER2 + The lobular component in primary breast tumor is ER 95% positive, PR 95% positive, strong staining, HER-2 negative.   Original biopsy [id number]: Ductal morphology, Her2 FISH positive. Current specimen: Main focus is 75% ductal morphology, 25% lobular morphology. Smaller focus is lobular morphology. #2A Lobular morphology, Her IHC negative. #2E Lobular morphology, Her2 FISH negative, Her2 IHC negative. #4 50% lobular and 50% ductal morphology, Her2 FISH positive, Her2 IHC negative. #6 Ductal morphology #7 Ductal morphology #10 50% lobular, 50% ductal morphology   08/25/2017 Imaging   [facility] abdomen [facility]:  Enhancing structure measing 5 mm is compatible with Sheila flash filling hemangioma. No specific findings to suggest metastatic cancer   08/25/2017 Imaging   MRI Abdomen at [facility] 08/25/17  IMPRESSION: 1. Lesion in segment 7 of the liver has signal and enhancement characteristics compatible with Sheila flash filling hemangioma. No specific findings identified to suggest metastatic disease.   08/29/2017 - 12/12/2017 Chemotherapy   adjuvant TCHP every 3 weeks for 6 cycles starting 08/29/17. Postponed and decreased Carbo to 625m and added Onpro with cycle 2 on 09/19/17 due to neutropenic fever. Carbplatin further reduced to 5020mstarting with cycle 3 due to thrombocytopenia. Herceptin was held for Cycle 4 and 5 due to worsening global longitudinal strain found on ECHO from 11/07/17. Completed 6 cycles on 12/12/17      09/04/2017 - 09/09/2017 Hospital Admission   Admit date: 09/04/2017 Discharge date: 09/09/2017  DISCHARGE DIAGNOSES:  Principal Problem:   [facility] Problems:   Malignant neoplasm of upper-outer quadrant of right breast in female,  estrogen receptor positive ([place])   Port-Sheila-Cath in place   Neutropenic fever ([place])   Hyponatremia   Malignant neoplasm of  breast in female, estrogen receptor positive ([place])    12/28/2017 Imaging   CT CHEST IMPRESSION: 1. 2.3 x 2.7 x 1.2 cm irregular masslike opacity within the right middle lobe worrisome for malignancy/metastasis. 2. 3 new very small sclerotic lesions, within the sternum and the body of T4, worrisome for new metastases.   12/28/2017 - 01/05/2018 Hospital Admission   Admit date: 12/28/17-01/05/18 Admission diagnosis: multifocal pneumonia, influenza Sheila, sepsis, hypoxia Additional comments: imaging during hospitalization notable for new irregular masslike opacity in the right middle lobe worrisome for metastasis and new very small sclerotic lesion within the sternum and the body of T4, worrisome for metastasis   12/29/2017 Imaging   CT ABD/PELVIS IMPRESSION: No definite acute intra-abdominal or intrapelvic abnormalities.  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   12/30/2017 Imaging   CT HEAD IMPRESSION: No acute intracranial abnormality or enhancing lesion.   01/16/2018 - 01/22/2019 Chemotherapy   Maintence Herceptin and Perjeta every 3 weeks for 6-12 months starting 01/16/18. Held 03/2018-07/17/18 due to low EF. Completed on 01/22/19    02/04/2018 PET scan   IMPRESSION: 1. The previous consolidation in the right middle lobe currently has Sheila more nodular and reticular appearance with some associated mild atelectasis along its anterior margin. This demonstrates low-grade metabolic activity with maximum SUV 3.2 (background blood pool activity is 2.7). Although conceivably related to low-grade residuum from the patient's malignancy, the presence of calcified right hilar lymph nodes and small calcifications along the nodularity raise suspicion for Sheila low-grade active granulomatous process or atypical infectious process. Surveillance is recommended. 2. Scattered deposits of hypermetabolic but benign metabolically active brown fat.   02/23/2018 - 04/09/2018 Radiation  Therapy   Radiation treatment dates:   02/23/2018 - 04/09/2018  Site/dose:   The patient initially received Sheila dose of 50.4 Gy in 28 fractions to the right chest wall and supraclavicular region. This was delivered using Sheila 3-D conformal, 4 field technique. The patient then received Sheila boost to the mastectomy scar. This delivered an additional 10 Gy in 5 fractions using an en face electron field. The total dose was 60.4 Gy.  Narrative: The patient tolerated radiation treatment relatively well.   The patient had some expected skin irritation as she progressed during treatment. Moist desquamation was not present at the end of treatment.   03/20/2018 -  Anti-estrogen oral therapy   -Monthly Zoladex injection starting 03/20/18  -Letrozole 2.22m once dialy starting 05/03/18  -Switched to Anastrozole starting 07/2018 Due to her worsening back pain. -Changed to Exemestane in 04/2019 due to back pain exacerbation.    04/06/2018 Imaging   CT Chest wo contrast IMPRESSION: 1. Stable linear nodular thickening in the RIGHT middle lobe is favored post infectious or inflammatory scarring. 2. Calcified RIGHT hilar lymph node is consistent post infectious process. 3. Stable small LEFT upper lobe nodule. 4. Stable sclerotic lesion in the manubrium. Sclerotic lesion at T3 is more prominent (4 mm). Sclerotic lesion at L 1 is new from 07/02/2017.   04/06/2018 Imaging   04/06/2018 CT Chest IMPRESSION: 1. Stable linear nodular thickening in the RIGHT middle lobe is favored post infectious or inflammatory scarring. 2. Calcified RIGHT hilar lymph node is consistent post infectious process. 3. Stable small LEFT upper lobe nodule. 4. Stable sclerotic lesion in the manubrium. Sclerotic lesion at T3 is more prominent (4 mm).  Sclerotic lesion at L 1 is new from 07/02/2017.   04/10/2018 Echocardiogram   LV EF 45%-50%   05/25/2018 Echocardiogram   LV EF 45%-50%   07/13/2018 Echocardiogram   Normal LV size with EF  55-60%   07/31/2018 Imaging   07/31/2018 Bone Scan IMPRESSION: No evidence of skeletal metastatic disease.   07/31/2018 Imaging   07/31/2018 CT CAP IMPRESSION: Residual linear and nodular peribronchovascular opacities in the right middle lobe, with other scattered areas of ground-glass opacities in the right middle, right upper and right lower lobes, which are located within the previously demonstrated bulky airspace consolidation. The findings are improved from the chest CT dated 12/30/2017, and relatively stable from PET-CT dated 02/04/2018. These may represent post infectious/post inflammatory changes or residual malignancy.  Stable partially calcified right hilar lymphadenopathy.  Interval development of nodular soft tissue thickening within the anterior mediastinum in the area of the thymus, new from chest CT dated 12/30/2017. This may represent reactive thymic tissue, thymoma or lymphadenopathy.  1.8 cm probably physiologic left ovarian cyst. Borderline thickening of the endometrium measuring 15.6 mm. Further evaluation with pelvic ultrasound may be considered.  No new or suspicious findings within the abdomen.   09/15/2018 Echocardiogram   EF 55-60%   01/28/2019 Imaging   MRI Lumbar spie 01/28/19  IMPRESSION: 1. Subcentimeter lesions in the L1 and L2 vertebral bodies, indeterminate for metastases. 2. Mild lower lumbar disc degeneration without stenosis.   01/28/2019 Imaging   CT chest 01/28/19  IMPRESSION: Right lung post radiation changes. No active lung disease or other acute findings.  No evidence of recurrent or metastatic carcinoma within the thorax.   03/15/2019 - 02/2020 Chemotherapy   Anti-HER2 Neratinib 240 mg daily, starting 03/15/19. She completed 1 year of treatment in 02/2020      CURRENT THERAPY:  -Monthly Zoladex injections starting 03/20/18  -Letrozole 2.57m once daily starting 05/03/18, changed to anastrozole in 07/2018. Changed to Exemestane in  04/2019 due to back pain exacerbation.  INTERVAL HISTORY:  Sheila [name] here for Sheila follow up of ongoing treatment. She presents to the clinic alone.  She notes she is doing well. She stopped Neratinib and no changes since she has been off. Her BM are mostly normal. She will be constipated occasionally, manageable. She notes knee pain mostly when she goes up and down stairs. She notes back related pain is how she sleeps. She notes she is tolerating Exemestane well with hot flashes. She has been managing them but can wake her up at night. She denies worsened headaches. She is managing trying to find Sheila job. She notes she was able to lose some weight.   REVIEW OF SYSTEMS:   Constitutional: Denies fevers, chills or abnormal weight loss (+) hot flashes Eyes: Denies blurriness of vision Ears, nose, mouth, throat, and face: Denies mucositis or sore throat Respiratory: Denies cough, dyspnea or wheezes Cardiovascular: Denies palpitation, chest discomfort or lower extremity swelling Gastrointestinal:  Denies nausea, heartburn (+) Occasional constipation Skin: Denies abnormal skin rashes MSK: (+) Knee pain  Lymphatics: Denies new lymphadenopathy or easy bruising Neurological:Denies numbness, tingling or new weaknesses Behavioral/Psych: Mood is stable, no new changes  All other systems were reviewed with the patient and are negative.  MEDICAL HISTORY:  Past Medical History:  Diagnosis Date  . Anxiety   . Cancer ([facility]    right breast   . Cardiomyopathy ([place]   . Family history of breast cancer   . Family history of colon cancer   .  GERD (gastroesophageal reflux disease)   . Headache    couple of migraines in the past  . History of kidney stones   . Vitamin D deficiency     SURGICAL HISTORY: Past Surgical History:  Procedure Laterality Date  . egg donation    . kidney stone removal    . MASS EXCISION Right 08/19/2019   Procedure: REMOVE EXCESS TISSUE FROM RIGHT LATERAL MASTECTOMY  SCAR;  Surgeon: [name], MD;  Location: [place];  Service: General;  Laterality: Right;  . MASTECTOMY W/ SENTINEL NODE BIOPSY Bilateral 07/22/2017   Procedure: RIGHT TOTAL MASTECTOMY WITH RIGHT AXILLARY SENTINEL LYMPH NODE BIOPSY, LEFT PROPHYLACTIC MASTECTOMY;  Surgeon: [name], MD;  Location: [place];  Service: General;  Laterality: Bilateral;  . PORT-Sheila-CATH REMOVAL Right 08/19/2019   Procedure: REMOVAL PORT-Sheila-CATH;  Surgeon: [name], MD;  Location: [place];  Service: General;  Laterality: Right;  . PORTACATH PLACEMENT Right 07/22/2017   Procedure: INSERTION PORT-Sheila-CATH WITH [name];  Surgeon: [name], MD;  Location: [place];  Service: General;  Laterality: Right;    I have reviewed the social history and family history with the patient and they are unchanged from previous note.  ALLERGIES:  has No Known Allergies.  MEDICATIONS:  Current Outpatient Medications  Medication Sig Dispense Refill  . B Complex-C (B-COMPLEX WITH VITAMIN C) tablet Take 1 tablet by mouth daily.    . calcium-vitamin D (OSCAL WITH D) 250-125 MG-UNIT tablet Take 1 tablet by mouth daily.    . Cholecalciferol (VITAMIN D) 2000 units tablet Take 2,000 Units by mouth daily.    . cyclobenzaprine (FLEXERIL) 5 MG tablet Take 1 tablet (5 mg total) by mouth 2 (two) times daily as needed for muscle spasms. 30 tablet 0  . diphenoxylate-atropine (LOMOTIL) 2.5-0.025 MG tablet Take 1-2 tablets by mouth 4 (four) times daily as needed for diarrhea or loose stools. 30 tablet 3  . exemestane (AROMASIN) 25 MG tablet TAKE 1 TABLET (25 MG TOTAL) BY MOUTH DAILY AFTER BREAKFAST. 30 tablet 5  . fluticasone (FLONASE) 50 MCG/ACT nasal spray Place into both nostrils daily.    Marland Kitchen levothyroxine (SYNTHROID, LEVOTHROID) 25 MCG tablet Take 25 mcg by mouth daily before breakfast.    . loperamide (IMODIUM) 2 MG capsule Take 1-2 capsules (2-4 mg total) by mouth 3 (three) times daily as  needed for diarrhea or loose stools. 120 capsule 0  . Melatonin 10 MG TABS Take 0.5 tablets by mouth.     . naproxen sodium (ANAPROX) 220 MG tablet Take 220 mg 2 (two) times daily as needed by mouth (pain).     . prochlorperazine (COMPAZINE) 10 MG tablet Take 1 tablet (10 mg total) by mouth every 6 (six) hours as needed (Nausea or vomiting). 45 tablet 3  . venlafaxine XR (EFFEXOR-XR) 75 MG 24 hr capsule TAKE 3 CAPSULES BY MOUTH EVERY DAY WITH BREAKFAST 90 capsule 5   Current Facility-Administered Medications  Medication Dose Route Frequency Provider Last Rate Last Admin  . gi cocktail (Maalox,Lidocaine,Donnatal)  30 mL Oral Once Harle Stanford., PA-C        PHYSICAL EXAMINATION: ECOG PERFORMANCE STATUS: 1 - Symptomatic but completely ambulatory  Vitals:   05/05/20 1507  BP: 101/68  Pulse: 99  Resp: 20  Temp: 97.9 F (36.6 C)  SpO2: 98%   Filed Weights   05/05/20 1507  Weight: 176 lb 9.6 oz (80.1 kg)    GENERAL:alert, no distress and comfortable SKIN: skin color, texture, turgor  are normal, no rashes or significant lesions EYES: normal, Conjunctiva are pink and non-injected, sclera clear  NECK: supple, thyroid normal size, non-tender, without nodularity LYMPH:  no palpable lymphadenopathy in the cervical, axillary  LUNGS: clear to auscultation and percussion with normal breathing effort HEART: regular rate & rhythm and no murmurs and no lower extremity edema ABDOMEN:abdomen soft, non-tender and normal bowel sounds Musculoskeletal:no cyanosis of digits and no clubbing  (+) right clavicle tenderness  NEURO: alert & oriented x 3 with fluent speech, no focal motor/sensory deficits BREAST: S/p b/l mastectomy with breasts surgically absent: Surgical incision healed well. No palpable mass, nodules or adenopathy bilaterally. Breast exam benign.   LABORATORY DATA:  I have reviewed the data as listed CBC Latest Ref Rng & Units 05/05/2020 02/03/2020 12/06/2019  WBC 4.0 - 10.5 K/uL 5.1 4.1  4.3  Hemoglobin 12.0 - 15.0 g/dL 11.4(L) 10.9(L) 12.1  Hematocrit 36 - 46 % 36.6 34.2(L) 37.1  Platelets 150 - 400 K/uL 251 232 235     CMP Latest Ref Rng & Units 05/05/2020 02/03/2020 12/06/2019  Glucose 70 - 99 mg/dL 102(H) 133(H) 187(H)  BUN 6 - 20 mg/dL '9 10 7  ' Creatinine 0.44 - 1.00 mg/dL 1.16(H) 1.19(H) 1.36(H)  Sodium 135 - 145 mmol/L 139 140 137  Potassium 3.5 - 5.1 mmol/L 4.0 3.9 4.4  Chloride 98 - 111 mmol/L 106 106 104  CO2 22 - 32 mmol/L '25 24 24  ' Calcium 8.9 - 10.3 mg/dL 9.6 8.8(L) 8.9  Total Protein 6.5 - 8.1 g/dL 7.7 7.2 7.5  Total Bilirubin 0.3 - 1.2 mg/dL 0.4 0.3 0.4  Alkaline Phos 38 - 126 U/L 120 98 113  AST 15 - 41 U/L 12(L) 17 21  ALT 0 - 44 U/L '11 16 19      ' RADIOGRAPHIC STUDIES: I have personally reviewed the radiological images as listed and agreed with the findings in the report. No results found.   ASSESSMENT & PLAN:  Azzie Thiem is Sheila 44 y.o. female with   1. Malignant neoplasm of upper-outer quadrant of right breast in female, mixed invasive ductal and lobular carcinoma, pT3(m)N2aM0, stage 1b, ductal carcinoma triple positive, lobular carcinoma ER+/PR+/HER2-, Grade 2 -She was diagnosed in 05/2017. She is s/pb/lmastectomy, adjuvant TCHP and radiationandmaintenanceHerceptin/Perjeta.  -Her genetic testing was negative. -Shestarted adjuvant letrozolewith monthlyZoladexinjectionsin 04/2018.Letrozole was subsequently changed to anastrozole, then exemestane due to some joint and back pain. She toleratesexemestane well with hot flashes, tolerable.  -She has cancelled her BSO for now. She may proceed in 2022. Shewill continue Zoladex injectionsmonthly. -She completed 1 year of Neratinib 257m daily. Tolerated well.  -She had PAC removed on 08/19/19.  -She is clinically doing well. She notes mild joint pain in knee and stale lower back pain. Her occasional constipation is manageable.  -Physical unremarkable except right clavicle tenderness.  Labs reviewed, CBC WNL except mild anemia. There rest of labs still pending.  -Continue Exemestane daily with monthly Zoladex injections.  -F/u in 4 months   2.H/o vaginal bleeding -Started 04/12/19 with cramping, light to moderate flow, fist time since she started Zoladex injections on 03/20/18.Has not recurred. -I checked her estradiol levelwas less than 5,inpostmenopausalrange.  -Wepreviouslydiscussed other possibility of postmenopausal bleeding, may need Sheila vaginal [place] still trying tofind Gyn to follow up with. I recommended our Gyn here such as Dr. RDenman Georgeonce she is ready to proceed with surgery or if she has any concerns. -She is still considering proceeding with BSO.  3. Hot flash -Mostlytolerable. ContinueEffexor2235m  daily. -Will monitor on exemestane  4. Bone Health -Continue Vitamin D supplement and Vitamin C rich foodand do weight bearing exercise.  -We will order Sheila bone density scan in the next 3-4 months.   5. Back pain, knee pain  -Her CT Chest on 04/06/18 showed sclerotic lesions of spine in T3 and L1, buther bone scan in 07/2018 was negative  -MRI lumbar from 01/28/19 shows lesions of L1 and L2indeterminate, will continue follow up -She will continueFlexeril and aleve for pain. -She has knee pain when taking the stairs. Manageable overall on AI. Her dull lower back pain is stable. -I encouraged her to remain active.   6. Anemia  -New anemia with Hg 10.9 on 02/03/20, MCV normal -No clinical signs of bleeding -I recommended her to try multivitamins including iron or prenatal vitamin.  -Hg at 11.4 today (05/05/20). Her iron panel, TSH and B12 still pending.   PLAN: -Proceed with Zoladex todayand continue monthly -Continue Exemestane  -Lab and f/u in4 months  -DEXA in 3-4 months before next visit    No problem-specific Assessment & Plan notes found for this encounter.   Orders Placed This Encounter  Procedures  . DG Bone  Density    Standing Status:   Future    Standing Expiration Date:   05/05/2021    Order Specific Question:   Reason for Exam (SYMPTOM  OR DIAGNOSIS REQUIRED)    Answer:   screening    Order Specific Question:   Is the patient pregnant?    Answer:   No    Order Specific Question:   Preferred imaging location?    Answer:   [facility]   All questions were answered. The patient knows to call the clinic with any problems, questions or concerns. No barriers to learning was detected. The total time spent in the appointment was 30 minutes.     [name], MD 05/05/2020   I, Joslyn Devon, am acting as scribe for [name], MD.   I have reviewed the above documentation for accuracy and completeness, and I agree with the above.

## 2020-05-05 ENCOUNTER — Encounter: Payer: Self-pay | Admitting: Hematology

## 2020-05-05 ENCOUNTER — Inpatient Hospital Stay: Payer: BC Managed Care – PPO | Attending: Nurse Practitioner

## 2020-05-05 ENCOUNTER — Inpatient Hospital Stay: Payer: BC Managed Care – PPO | Admitting: Hematology

## 2020-05-05 ENCOUNTER — Other Ambulatory Visit: Payer: Self-pay

## 2020-05-05 ENCOUNTER — Inpatient Hospital Stay: Payer: BC Managed Care – PPO

## 2020-05-05 VITALS — BP 101/68 | HR 99 | Temp 97.9°F | Resp 20 | Wt 176.6 lb

## 2020-05-05 DIAGNOSIS — Z9011 Acquired absence of right breast and nipple: Secondary | ICD-10-CM | POA: Insufficient documentation

## 2020-05-05 DIAGNOSIS — Z170 Estrogen receptor positive status [ER+]: Secondary | ICD-10-CM | POA: Diagnosis not present

## 2020-05-05 DIAGNOSIS — N951 Menopausal and female climacteric states: Secondary | ICD-10-CM | POA: Diagnosis not present

## 2020-05-05 DIAGNOSIS — F419 Anxiety disorder, unspecified: Secondary | ICD-10-CM | POA: Insufficient documentation

## 2020-05-05 DIAGNOSIS — Z9221 Personal history of antineoplastic chemotherapy: Secondary | ICD-10-CM | POA: Diagnosis not present

## 2020-05-05 DIAGNOSIS — C50411 Malignant neoplasm of upper-outer quadrant of right female breast: Secondary | ICD-10-CM

## 2020-05-05 DIAGNOSIS — Z79811 Long term (current) use of aromatase inhibitors: Secondary | ICD-10-CM | POA: Diagnosis not present

## 2020-05-05 DIAGNOSIS — Z79899 Other long term (current) drug therapy: Secondary | ICD-10-CM | POA: Insufficient documentation

## 2020-05-05 DIAGNOSIS — Z95828 Presence of other vascular implants and grafts: Secondary | ICD-10-CM

## 2020-05-05 DIAGNOSIS — E611 Iron deficiency: Secondary | ICD-10-CM | POA: Insufficient documentation

## 2020-05-05 DIAGNOSIS — Z9013 Acquired absence of bilateral breasts and nipples: Secondary | ICD-10-CM | POA: Diagnosis not present

## 2020-05-05 DIAGNOSIS — I429 Cardiomyopathy, unspecified: Secondary | ICD-10-CM | POA: Diagnosis not present

## 2020-05-05 DIAGNOSIS — M25569 Pain in unspecified knee: Secondary | ICD-10-CM | POA: Insufficient documentation

## 2020-05-05 DIAGNOSIS — K5900 Constipation, unspecified: Secondary | ICD-10-CM | POA: Insufficient documentation

## 2020-05-05 DIAGNOSIS — R232 Flushing: Secondary | ICD-10-CM | POA: Insufficient documentation

## 2020-05-05 DIAGNOSIS — Z923 Personal history of irradiation: Secondary | ICD-10-CM | POA: Insufficient documentation

## 2020-05-05 DIAGNOSIS — D649 Anemia, unspecified: Secondary | ICD-10-CM

## 2020-05-05 LAB — VITAMIN B12: Vitamin B-12: 201 pg/mL (ref 180–914)

## 2020-05-05 LAB — CBC WITH DIFFERENTIAL/PLATELET
Abs Immature Granulocytes: 0.01 10*3/uL (ref 0.00–0.07)
Basophils Absolute: 0.1 10*3/uL (ref 0.0–0.1)
Basophils Relative: 1 %
Eosinophils Absolute: 0.2 10*3/uL (ref 0.0–0.5)
Eosinophils Relative: 3 %
HCT: 36.6 % (ref 36.0–46.0)
Hemoglobin: 11.4 g/dL — ABNORMAL LOW (ref 12.0–15.0)
Immature Granulocytes: 0 %
Lymphocytes Relative: 33 %
Lymphs Abs: 1.7 10*3/uL (ref 0.7–4.0)
MCH: 26.2 pg (ref 26.0–34.0)
MCHC: 31.1 g/dL (ref 30.0–36.0)
MCV: 84.1 fL (ref 80.0–100.0)
Monocytes Absolute: 0.4 10*3/uL (ref 0.1–1.0)
Monocytes Relative: 9 %
Neutro Abs: 2.7 10*3/uL (ref 1.7–7.7)
Neutrophils Relative %: 54 %
Platelets: 251 10*3/uL (ref 150–400)
RBC: 4.35 MIL/uL (ref 3.87–5.11)
RDW: 14.3 % (ref 11.5–15.5)
WBC: 5.1 10*3/uL (ref 4.0–10.5)
nRBC: 0 % (ref 0.0–0.2)

## 2020-05-05 LAB — COMPREHENSIVE METABOLIC PANEL
ALT: 11 U/L (ref 0–44)
AST: 12 U/L — ABNORMAL LOW (ref 15–41)
Albumin: 4 g/dL (ref 3.5–5.0)
Alkaline Phosphatase: 120 U/L (ref 38–126)
Anion gap: 8 (ref 5–15)
BUN: 9 mg/dL (ref 6–20)
CO2: 25 mmol/L (ref 22–32)
Calcium: 9.6 mg/dL (ref 8.9–10.3)
Chloride: 106 mmol/L (ref 98–111)
Creatinine, Ser: 1.16 mg/dL — ABNORMAL HIGH (ref 0.44–1.00)
GFR calc Af Amer: >60 mL/min (ref >60)
GFR calc non Af Amer: 58 mL/min — ABNORMAL LOW (ref >60)
Glucose, Bld: 102 mg/dL — ABNORMAL HIGH (ref 70–99)
Potassium: 4 mmol/L (ref 3.5–5.1)
Sodium: 139 mmol/L (ref 135–145)
Total Bilirubin: 0.4 mg/dL (ref 0.3–1.2)
Total Protein: 7.7 g/dL (ref 6.5–8.1)

## 2020-05-05 MED ORDER — GOSERELIN ACETATE 3.6 MG ~~LOC~~ IMPL
DRUG_IMPLANT | SUBCUTANEOUS | Status: AC
  Filled 2020-05-05: qty 3.6

## 2020-05-05 MED ORDER — GOSERELIN ACETATE 3.6 MG ~~LOC~~ IMPL
3.6000 mg | DRUG_IMPLANT | Freq: Once | SUBCUTANEOUS | Status: AC
  Administered 2020-05-05: 3.6 mg via SUBCUTANEOUS

## 2020-05-06 LAB — CANCER ANTIGEN 27.29: CA 27.29: 7.9 U/mL (ref 0.0–38.6)

## 2020-05-07 LAB — FOLATE RBC
Folate, Hemolysate: 326 ng/mL
Folate, RBC: 908 ng/mL (ref >498)
Hematocrit: 35.9 % (ref 34.0–46.6)

## 2020-05-08 ENCOUNTER — Telehealth: Payer: Self-pay | Admitting: Hematology

## 2020-05-08 LAB — IRON AND TIBC
Iron: 55 ug/dL (ref 41–142)
Saturation Ratios: 11 % — ABNORMAL LOW (ref 21–57)
TIBC: 519 ug/dL — ABNORMAL HIGH (ref 236–444)
UIBC: 464 ug/dL — ABNORMAL HIGH (ref 120–384)

## 2020-05-08 LAB — FERRITIN: Ferritin: 4 ng/mL — ABNORMAL LOW (ref 11–307)

## 2020-05-08 LAB — TSH: TSH: 2.046 u[IU]/mL (ref 0.308–3.960)

## 2020-05-08 NOTE — Telephone Encounter (Signed)
Scheduled per 6/18 los. Pt is aware of appt times and dates.

## 2020-05-17 DIAGNOSIS — H9313 Tinnitus, bilateral: Secondary | ICD-10-CM | POA: Diagnosis not present

## 2020-05-17 DIAGNOSIS — D509 Iron deficiency anemia, unspecified: Secondary | ICD-10-CM | POA: Diagnosis not present

## 2020-05-17 DIAGNOSIS — E039 Hypothyroidism, unspecified: Secondary | ICD-10-CM | POA: Diagnosis not present

## 2020-05-17 DIAGNOSIS — E559 Vitamin D deficiency, unspecified: Secondary | ICD-10-CM | POA: Diagnosis not present

## 2020-05-30 ENCOUNTER — Telehealth: Payer: Self-pay | Admitting: *Deleted

## 2020-05-30 NOTE — Telephone Encounter (Signed)
-----   Message from [name], MD sent at 05/27/2020 11:28 AM EDT ----- Please let pt know that her iron level was low on last lab 05/05/2020, and I recommend her to take OTC ferrous sulfate 1-2 tabd daily, with oral juice or vitC, watch for constipation. Thanks  [name]  05/27/2020

## 2020-05-30 NOTE — Telephone Encounter (Signed)
Contacted patient with iron level result message as per Dr. Burr Medico. Patient verbalized understanding and reports that she had appointment with PCP and was started on Ferrous Sulfate 325 mg by her PCP.

## 2020-06-01 ENCOUNTER — Other Ambulatory Visit: Payer: Self-pay | Admitting: Hematology

## 2020-06-05 ENCOUNTER — Other Ambulatory Visit: Payer: Self-pay

## 2020-06-05 ENCOUNTER — Inpatient Hospital Stay: Payer: BC Managed Care – PPO | Attending: Nurse Practitioner

## 2020-06-05 VITALS — BP 113/78 | HR 99 | Temp 99.0°F | Resp 18

## 2020-06-05 DIAGNOSIS — Z95828 Presence of other vascular implants and grafts: Secondary | ICD-10-CM

## 2020-06-05 DIAGNOSIS — C50411 Malignant neoplasm of upper-outer quadrant of right female breast: Secondary | ICD-10-CM | POA: Diagnosis not present

## 2020-06-05 DIAGNOSIS — Z79818 Long term (current) use of other agents affecting estrogen receptors and estrogen levels: Secondary | ICD-10-CM | POA: Diagnosis not present

## 2020-06-05 DIAGNOSIS — Z170 Estrogen receptor positive status [ER+]: Secondary | ICD-10-CM | POA: Insufficient documentation

## 2020-06-05 MED ORDER — GOSERELIN ACETATE 3.6 MG ~~LOC~~ IMPL
3.6000 mg | DRUG_IMPLANT | Freq: Once | SUBCUTANEOUS | Status: AC
  Administered 2020-06-05: 3.6 mg via SUBCUTANEOUS

## 2020-06-05 MED ORDER — GOSERELIN ACETATE 3.6 MG ~~LOC~~ IMPL
DRUG_IMPLANT | SUBCUTANEOUS | Status: AC
  Filled 2020-06-05: qty 3.6

## 2020-06-05 NOTE — Patient Instructions (Signed)

## 2020-07-05 ENCOUNTER — Other Ambulatory Visit: Payer: Self-pay

## 2020-07-05 ENCOUNTER — Inpatient Hospital Stay: Payer: BC Managed Care – PPO | Attending: Nurse Practitioner

## 2020-07-05 VITALS — BP 100/68 | Resp 18

## 2020-07-05 DIAGNOSIS — C50411 Malignant neoplasm of upper-outer quadrant of right female breast: Secondary | ICD-10-CM | POA: Diagnosis not present

## 2020-07-05 DIAGNOSIS — Z9221 Personal history of antineoplastic chemotherapy: Secondary | ICD-10-CM | POA: Diagnosis not present

## 2020-07-05 DIAGNOSIS — Z95828 Presence of other vascular implants and grafts: Secondary | ICD-10-CM

## 2020-07-05 DIAGNOSIS — Z170 Estrogen receptor positive status [ER+]: Secondary | ICD-10-CM | POA: Insufficient documentation

## 2020-07-05 DIAGNOSIS — Z923 Personal history of irradiation: Secondary | ICD-10-CM | POA: Diagnosis not present

## 2020-07-05 DIAGNOSIS — Z5111 Encounter for antineoplastic chemotherapy: Secondary | ICD-10-CM | POA: Insufficient documentation

## 2020-07-05 MED ORDER — GOSERELIN ACETATE 3.6 MG ~~LOC~~ IMPL
DRUG_IMPLANT | SUBCUTANEOUS | Status: AC
  Filled 2020-07-05: qty 3.6

## 2020-07-05 MED ORDER — GOSERELIN ACETATE 3.6 MG ~~LOC~~ IMPL
3.6000 mg | DRUG_IMPLANT | Freq: Once | SUBCUTANEOUS | Status: AC
  Administered 2020-07-05: 3.6 mg via SUBCUTANEOUS

## 2020-07-05 NOTE — Patient Instructions (Signed)

## 2020-08-04 ENCOUNTER — Inpatient Hospital Stay: Payer: BC Managed Care – PPO | Attending: Nurse Practitioner

## 2020-08-04 ENCOUNTER — Other Ambulatory Visit: Payer: Self-pay

## 2020-08-04 DIAGNOSIS — Z5111 Encounter for antineoplastic chemotherapy: Secondary | ICD-10-CM | POA: Diagnosis not present

## 2020-08-04 DIAGNOSIS — C50411 Malignant neoplasm of upper-outer quadrant of right female breast: Secondary | ICD-10-CM

## 2020-08-04 DIAGNOSIS — Z170 Estrogen receptor positive status [ER+]: Secondary | ICD-10-CM | POA: Diagnosis not present

## 2020-08-04 DIAGNOSIS — Z95828 Presence of other vascular implants and grafts: Secondary | ICD-10-CM

## 2020-08-04 MED ORDER — GOSERELIN ACETATE 3.6 MG ~~LOC~~ IMPL
3.6000 mg | DRUG_IMPLANT | Freq: Once | SUBCUTANEOUS | Status: AC
  Administered 2020-08-04: 3.6 mg via SUBCUTANEOUS

## 2020-08-04 MED ORDER — GOSERELIN ACETATE 3.6 MG ~~LOC~~ IMPL
DRUG_IMPLANT | SUBCUTANEOUS | Status: AC
  Filled 2020-08-04: qty 3.6

## 2020-08-04 NOTE — Patient Instructions (Signed)

## 2020-08-31 ENCOUNTER — Other Ambulatory Visit: Payer: Self-pay

## 2020-08-31 ENCOUNTER — Ambulatory Visit
Admission: RE | Admit: 2020-08-31 | Discharge: 2020-08-31 | Disposition: A | Discharge status: Home / Self Care | Payer: BC Managed Care – PPO | Source: Ambulatory Visit | Attending: Hematology | Admitting: Hematology

## 2020-08-31 DIAGNOSIS — Z13820 Encounter for screening for osteoporosis: Secondary | ICD-10-CM | POA: Diagnosis not present

## 2020-08-31 DIAGNOSIS — Z780 Asymptomatic menopausal state: Secondary | ICD-10-CM | POA: Diagnosis not present

## 2020-08-31 DIAGNOSIS — Z170 Estrogen receptor positive status [ER+]: Secondary | ICD-10-CM

## 2020-08-31 DIAGNOSIS — C50411 Malignant neoplasm of upper-outer quadrant of right female breast: Secondary | ICD-10-CM

## 2020-09-04 NOTE — Progress Notes (Signed)
[facility]   [phone] Fax:(336) [phone]   Clinic Follow up Note   Patient Care Team: Juluis Rainier, MD as PCP - General (Family Medicine) Delano Metz MD (Family Medicine) Emelia Loron, MD as Consulting Physician (General Surgery) Malachy Mood, MD as Consulting Physician (Hematology) Dorothy Puffer, MD as Consulting Physician (Radiation Oncology)  Date of Service:  09/07/2020  CHIEF COMPLAINT:  F/u of right breast cancer  SUMMARY OF ONCOLOGIC HISTORY: Oncology History Overview Note   Cancer Staging Malignant neoplasm of upper-outer quadrant of right breast in female, estrogen receptor positive (HCC) Staging form: Breast, AJCC 8th Edition - Clinical stage from 06/11/2017: Stage IB (cT3, cN0, cM0, G2, ER: Positive, PR: Positive, HER2: Positive) - Signed by Malachy Mood, MD on 06/16/2017 - Pathologic stage from 07/22/2017: Stage IB (pT3(m), pN2a(sn), cM0, G2, ER: Positive, PR: Positive, HER2: Positive) - Signed by Pollyann Samples, NP on 08/04/2017    Malignant neoplasm of upper-outer quadrant of right breast in female, estrogen receptor positive (HCC)  06/11/2017 Initial Biopsy   Diagnosis 06/11/17 Breast, right, needle core biopsy, 11:00 o'clock - INVASIVE DUCTAL CARCINOMA, G2   06/11/2017 Mammogram   [name] and [facility] Diagnostic Breast Tomo Bilateral 06/11/17 IMPRESSION: 1. Highly suspicious mass within the retroareolar right breast, extending from the 8:00 to 11:00 axes, anterior to posterior depth, extending anteriorly to the nipple with associated nipple retraction, measuring at least 5.5 cm by ultrasound  2. No sonographic evidence of metastatic lymphadenopathy in the right axilla. 3. No evidence of malignancy within the left breast.    06/11/2017 Initial Diagnosis   Malignant neoplasm of upper-outer quadrant of right breast in female, estrogen receptor positive (HCC)   06/11/2017 Receptors her2   Estrogen Receptor: 100%, POSITIVE, STRONG STAINING  INTENSITY Progesterone Receptor: 100%, POSITIVE, STRONG STAINING INTENSITY Proliferation Marker Ki67: 12%Proliferation Marker Ki67: 12%  HER2 - **POSITIVE** RATIO OF HER2/CEP17 SIGNALS 2.55 AVERAGE HER2 COPY NUMBER PER CELL 4.85   06/25/2017 Genetic Testing   Negative genetic testing on the 9 gene STAT panel.  The STAT Breast cancer panel offered by [facility] includes sequencing and rearrangement analysis for the following 9 genes:  ATM, BRCA1, BRCA2, CDH1, CHEK2, PALB2, PTEN, STK11 and TP53.   The report date is June 25, 2017.  Negative genetic testing on the common hereditary cancer panel.  The Hereditary Gene Panel offered by [facility] includes sequencing and/or deletion duplication testing of the following 46 genes: APC, ATM, AXIN2, BARD1, BMPR1A, BRCA1, BRCA2, BRIP1, CDH1, CDKN2A (p14ARF), CDKN2A (p16INK4a), CHEK2, CTNNA1, DICER1, EPCAM (Deletion/duplication testing only), GREM1 (promoter region deletion/duplication testing only), KIT, MEN1, MLH1, MSH2, MSH3, MSH6, MUTYH, NBN, NF1, NHTL1, PALB2, PDGFRA, PMS2, POLD1, POLE, PTEN, RAD50, RAD51C, RAD51D, SDHB, SDHC, SDHD, SMAD4, SMARCA4. STK11, TP53, TSC1, TSC2, and VHL.  The following genes were evaluated for sequence changes only: SDHA and HOXB13 c.251G>A variant only.  The report date is June 25, 2017.    07/02/2017 Imaging   CT cAP 07/02/17 IMPRESSION: 1. Subareolar right breast mass. No compelling findings of nodal or metastatic involvement. 2. Old granulomatous disease. 3. There is a 7 mm enhancing or hyperdense focus posteriorly in segment 7 of the liver in the subcapsular region. By virtue of its small size this lesion is technically nonspecific although statistically likely to be a small benign lesions such as flash filling hemangioma. This may warrant surveillance. 4.  Prominent stool throughout the colon favors constipation. 5. Degenerative disc disease and spondylosis at L5-S1 likely causing mild impingement.   07/02/2017 Imaging  Bone scan 07/02/17 IMPRESSION: Today' s exam is negative. I ascribed the tiny focus of activity just proximal to the left antecubital region to injection site.   07/22/2017 Surgery   RIGHT TOTAL MASTECTOMY WITH RIGHT AXILLARY SENTINEL LYMPH NODE BIOPSY, LEFT PROPHYLACTIC MASTECTOM and INSERTION PORT-A-CATH WITH [name] by [name] and [facility]    07/22/2017 Pathology Results   Diagnosis 1. Breast, simple mastectomy, Left - FIBROADENOMA. - NO MALIGNANCY IDENTIFIED. 2. 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 - ADDITIONAL FOCUS OF LOBULAR CARCINOMA, GRADE 2, SPANNING 2.2 CM. - INTERMEDIATE GRADE DUCTAL CARCINOMA IN SITU. - INVASIVE CARCINOMA COMES TO WITHIN 0.2 TO 0.3 CM OF THE DEEP MARGIN, FOCALLY. - TUMOR FOCALLY INVOLVES EPIDERMIS. - LYMPHOVASCULAR INVASION OF DERMAL LYMPHATICS. - ONE OF ONE LYMPH NODES NEGATIVE FOR CARCINOMA (0/1). - SEE ONCOLOGY TABLE. 3. Lymph node, sentinel, biopsy, Right axillary - ONE LYMPH NODE WITH ISOLATED TUMOR CELLS (0/1). 4. Lymph node, sentinel, biopsy, Right - METASTATIC CARCINOMA IN ONE OF ONE LYMPH NODES (1/1). 5. Lymph node, sentinel, biopsy, Right - ONE OF ONE LYMPH NODES NEGATIVE FOR CARCINOMA (0/1). 6. Lymph node, sentinel, biopsy, Right - METASTATIC CARCINOMA IN ONE OF ONE LYMPH NODES (1/1). - EXTRACAPSULAR EXTENSION. 7. Lymph node, sentinel, biopsy, Right - METASTATIC CARCINOMA IN ONE OF ONE LYMPH NODES (1/1). - EXTRACAPSULAR EXTENSION. 8. Lymph node, sentinel, biopsy, Right - ONE OF ONE LYMPH NODES NEGATIVE FOR CARCINOMA (0/1). 9. Lymph node, sentinel, biopsy, Right - ONE OF ONE LYMPH NODES NEGATIVE FOR CARCINOMA (0/1). 10. Lymph node, sentinel, biopsy, Right - METASTATIC CARCINOMA IN ONE OF ONE LYMPH NODES (1/1). - EXTRACAPSULAR EXTENSION. 11. Lymph node, biopsy, Right axillary - ONE OF ONE LYMPH NODES NEGATIVE FOR CARCINOMA (0/1).    07/22/2017 Receptors her2    Ductal carcinoma in primary breast tumor and node metastasis are ER 95%, PR 95% strongly positive, and HER2 + The lobular component in primary breast tumor is ER 95% positive, PR 95% positive, strong staining, HER-2 negative.   Original biopsy [id number]: Ductal morphology, Her2 FISH positive. Current specimen: Main focus is 75% ductal morphology, 25% lobular morphology. Smaller focus is lobular morphology. #2A Lobular morphology, Her IHC negative. #2E Lobular morphology, Her2 FISH negative, Her2 IHC negative. #4 50% lobular and 50% ductal morphology, Her2 FISH positive, Her2 IHC negative. #6 Ductal morphology #7 Ductal morphology #10 50% lobular, 50% ductal morphology   08/25/2017 Imaging   MRI abdomen done at [facility]:  Enhancing structure measing 5 mm is compatible with a flash filling hemangioma. No specific findings to suggest metastatic cancer   08/25/2017 Imaging   MRI Abdomen at [facility] 08/25/17  IMPRESSION: 1. Lesion in segment 7 of the liver has signal and enhancement characteristics compatible with a flash filling hemangioma. No specific findings identified to suggest metastatic disease.   08/29/2017 - 12/12/2017 Chemotherapy   adjuvant TCHP every 3 weeks for 6 cycles starting 08/29/17. Postponed and decreased Carbo to $Remove'650mg'iLiPxxB$  and added Onpro with cycle 2 on 09/19/17 due to neutropenic fever. Carbplatin further reduced to $RemoveBe'500mg'ISAmKlttD$  starting with cycle 3 due to thrombocytopenia. Herceptin was held for Cycle 4 and 5 due to worsening global longitudinal strain found on ECHO from 11/07/17. Completed 6 cycles on 12/12/17      09/04/2017 - 09/09/2017 Hospital Admission   Admit date: 09/04/2017 Discharge date: 09/09/2017  DISCHARGE DIAGNOSES:  Principal Problem:   Sepsis [facility]) Active Problems:   Malignant neoplasm of upper-outer quadrant of right breast in female,  estrogen receptor positive ([place])   Port-A-Cath in place   Neutropenic fever ([place])   Hyponatremia   Malignant neoplasm of  breast in female, estrogen receptor positive ([place])    12/28/2017 Imaging   CT CHEST IMPRESSION: 1. 2.3 x 2.7 x 1.2 cm irregular masslike opacity within the right middle lobe worrisome for malignancy/metastasis. 2. 3 new very small sclerotic lesions, within the sternum and the body of T4, worrisome for new metastases.   12/28/2017 - 01/05/2018 Hospital Admission   Admit date: 12/28/17-01/05/18 Admission diagnosis: multifocal pneumonia, influenza A, sepsis, hypoxia Additional comments: imaging during hospitalization notable for new irregular masslike opacity in the right middle lobe worrisome for metastasis and new very small sclerotic lesion within the sternum and the body of T4, worrisome for metastasis   12/29/2017 Imaging   CT ABD/PELVIS IMPRESSION: No definite acute intra-abdominal or intrapelvic abnormalities.  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   12/30/2017 Imaging   CT HEAD IMPRESSION: No acute intracranial abnormality or enhancing lesion.   01/16/2018 - 01/22/2019 Chemotherapy   Maintence Herceptin and Perjeta every 3 weeks for 6-12 months starting 01/16/18. Held 03/2018-07/17/18 due to low EF. Completed on 01/22/19    02/04/2018 PET scan   IMPRESSION: 1. The previous consolidation in the right middle lobe currently has a more nodular and reticular appearance with some associated mild atelectasis along its anterior margin. This demonstrates low-grade metabolic activity with maximum SUV 3.2 (background blood pool activity is 2.7). Although conceivably related to low-grade residuum from the patient's malignancy, the presence of calcified right hilar lymph nodes and small calcifications along the nodularity raise suspicion for a low-grade active granulomatous process or atypical infectious process. Surveillance is recommended. 2. Scattered deposits of hypermetabolic but benign metabolically active brown fat.   02/23/2018 - 04/09/2018 Radiation  Therapy   Radiation treatment dates:   02/23/2018 - 04/09/2018  Site/dose:   The patient initially received a dose of 50.4 Gy in 28 fractions to the right chest wall and supraclavicular region. This was delivered using a 3-D conformal, 4 field technique. The patient then received a boost to the mastectomy scar. This delivered an additional 10 Gy in 5 fractions using an en face electron field. The total dose was 60.4 Gy.  Narrative: The patient tolerated radiation treatment relatively well.   The patient had some expected skin irritation as she progressed during treatment. Moist desquamation was not present at the end of treatment.   03/20/2018 -  Anti-estrogen oral therapy   -Monthly Zoladex injection starting 03/20/18  -Letrozole 2.$RemoveBefor'5mg'xufadMfaJZRP$  once dialy starting 05/03/18  -Switched to Anastrozole starting 07/2018 Due to her worsening back pain. -Changed to Exemestane in 04/2019 due to back pain exacerbation.    04/06/2018 Imaging   CT Chest wo contrast IMPRESSION: 1. Stable linear nodular thickening in the RIGHT middle lobe is favored post infectious or inflammatory scarring. 2. Calcified RIGHT hilar lymph node is consistent post infectious process. 3. Stable small LEFT upper lobe nodule. 4. Stable sclerotic lesion in the manubrium. Sclerotic lesion at T3 is more prominent (4 mm). Sclerotic lesion at L 1 is new from 07/02/2017.   04/06/2018 Imaging   04/06/2018 CT Chest IMPRESSION: 1. Stable linear nodular thickening in the RIGHT middle lobe is favored post infectious or inflammatory scarring. 2. Calcified RIGHT hilar lymph node is consistent post infectious process. 3. Stable small LEFT upper lobe nodule. 4. Stable sclerotic lesion in the manubrium. Sclerotic lesion at T3 is more prominent (4 mm).  Sclerotic lesion at L 1 is new from 07/02/2017.   04/10/2018 Echocardiogram   LV EF 45%-50%   05/25/2018 Echocardiogram   LV EF 45%-50%   07/13/2018 Echocardiogram   Normal LV size with EF  55-60%   07/31/2018 Imaging   07/31/2018 Bone Scan IMPRESSION: No evidence of skeletal metastatic disease.   07/31/2018 Imaging   07/31/2018 CT CAP IMPRESSION: Residual linear and nodular peribronchovascular opacities in the right middle lobe, with other scattered areas of ground-glass opacities in the right middle, right upper and right lower lobes, which are located within the previously demonstrated bulky airspace consolidation. The findings are improved from the chest CT dated 12/30/2017, and relatively stable from PET-CT dated 02/04/2018. These may represent post infectious/post inflammatory changes or residual malignancy.  Stable partially calcified right hilar lymphadenopathy.  Interval development of nodular soft tissue thickening within the anterior mediastinum in the area of the thymus, new from chest CT dated 12/30/2017. This may represent reactive thymic tissue, thymoma or lymphadenopathy.  1.8 cm probably physiologic left ovarian cyst. Borderline thickening of the endometrium measuring 15.6 mm. Further evaluation with pelvic ultrasound may be considered.  No new or suspicious findings within the abdomen.   09/15/2018 Echocardiogram   EF 55-60%   01/28/2019 Imaging   MRI Lumbar spie 01/28/19  IMPRESSION: 1. Subcentimeter lesions in the L1 and L2 vertebral bodies, indeterminate for metastases. 2. Mild lower lumbar disc degeneration without stenosis.   01/28/2019 Imaging   CT chest 01/28/19  IMPRESSION: Right lung post radiation changes. No active lung disease or other acute findings.  No evidence of recurrent or metastatic carcinoma within the thorax.   03/15/2019 - 02/2020 Chemotherapy   Anti-HER2 Neratinib 240 mg daily, starting 03/15/19. She completed 1 year of treatment in 02/2020      CURRENT THERAPY:  -Monthly Zoladex injections starting 03/20/18  -Letrozole 2.29m once daily starting 05/03/18, changed to anastrozole in 07/2018. Changed to Exemestane in  04/2019 due to back pain exacerbation.  INTERVAL HISTORY:  Sheila [name] here for a follow up of right breast cancer. She presents to the clinic alone.  Doing well overall, now back to work full time Mild hot flushes, occasional leg cramps and back cramps  No new pain or GI symptoms   All other systems were reviewed with the patient and are negative.  MEDICAL HISTORY:  Past Medical History:  Diagnosis Date   Anxiety    [place]    [place]    [place]    Family history of breast cancer    Family history of colon cancer    GERD (gastroesophageal reflux disease)    Headache    couple of migraines in the past   History of kidney stones    Vitamin D deficiency     SURGICAL HISTORY: Past Surgical History:  Procedure Laterality Date   egg donation     kidney stone removal     MASS EXCISION Right 08/19/2019   Procedure: REMOVE EXCESS TISSUE FROM RIGHT LATERAL MASTECTOMY SCAR;  Surgeon: WRolm Bookbinder MD;  [facility]  Service: General;  Laterality: Right;   MASTECTOMY W/ SENTINEL NODE BIOPSY Bilateral 07/22/2017   Procedure: RIGHT TOTAL MASTECTOMY WITH RIGHT AXILLARY SENTINEL LYMPH NODE BIOPSY, LEFT PROPHYLACTIC MASTECTOMY;  Surgeon: WRolm Bookbinder MD;  [facility]  Service: General;  Laterality: Bilateral;   PORT-A-CATH REMOVAL Right 08/19/2019   Procedure: REMOVAL PORT-A-CATH;  Surgeon: WRolm Bookbinder MD;  [facility]  Service: General;  Laterality: Right;   PORTACATH PLACEMENT Right 07/22/2017   Procedure: INSERTION PORT-A-CATH WITH [name];  Surgeon: [name], MD;  Location: [facility];  Service: General;  Laterality: Right;    I have reviewed the social history and family history with the patient and they are unchanged from previous note.  ALLERGIES:  has No Known Allergies.  MEDICATIONS:  Current Outpatient Medications  Medication Sig Dispense Refill   B Complex-C  (B-COMPLEX WITH VITAMIN C) tablet Take 1 tablet by mouth daily.     calcium-vitamin D (OSCAL WITH D) 250-125 MG-UNIT tablet Take 1 tablet by mouth daily.     Cholecalciferol (VITAMIN D) 2000 units tablet Take 2,000 Units by mouth daily.     cyclobenzaprine (FLEXERIL) 5 MG tablet Take 1 tablet (5 mg total) by mouth 2 (two) times daily as needed for muscle spasms. 30 tablet 0   diphenoxylate-atropine (LOMOTIL) 2.5-0.025 MG tablet Take 1-2 tablets by mouth 4 (four) times daily as needed for diarrhea or loose stools. 30 tablet 3   exemestane (AROMASIN) 25 MG tablet TAKE 1 TABLET (25 MG TOTAL) BY MOUTH DAILY AFTER BREAKFAST. 30 tablet 5   fluticasone (FLONASE) 50 MCG/ACT nasal spray Place into both nostrils daily.     levothyroxine (SYNTHROID, LEVOTHROID) 25 MCG tablet Take 25 mcg by mouth daily before breakfast.     loperamide (IMODIUM) 2 MG capsule Take 1-2 capsules (2-4 mg total) by mouth 3 (three) times daily as needed for diarrhea or loose stools. 120 capsule 0   Melatonin 10 MG TABS Take 0.5 tablets by mouth.      naproxen sodium (ANAPROX) 220 MG tablet Take 220 mg 2 (two) times daily as needed by mouth (pain).      prochlorperazine (COMPAZINE) 10 MG tablet Take 1 tablet (10 mg total) by mouth every 6 (six) hours as needed (Nausea or vomiting). 45 tablet 3   venlafaxine XR (EFFEXOR-XR) 75 MG 24 hr capsule TAKE 3 CAPSULES BY MOUTH EVERY DAY WITH BREAKFAST 90 capsule 5   Current Facility-Administered Medications  Medication Dose Route Frequency Provider Last Rate Last Admin   gi cocktail (Maalox,Lidocaine,Donnatal)  30 mL Oral Once Harle Stanford., PA-C        PHYSICAL EXAMINATION: ECOG PERFORMANCE STATUS: 1 - Symptomatic but completely ambulatory  Vitals:   09/07/20 1408  BP: (!) 90/59  Pulse: (!) 108  Resp: 18  Temp: 98.2 F (36.8 C)  SpO2: 100%   Filed Weights   09/07/20 1408  Weight: 180 lb 8 oz (81.9 kg)    GENERAL:alert, no distress and comfortable SKIN: skin  color, texture, turgor are normal, no rashes or significant lesions EYES: normal, Conjunctiva are pink and non-injected, sclera clear NECK: supple, thyroid normal size, non-tender, without nodularity LYMPH:  no palpable lymphadenopathy in the cervical, axillary  LUNGS: clear to auscultation and percussion with normal breathing effort HEART: regular rate & rhythm and no murmurs and no lower extremity edema ABDOMEN:abdomen soft, non-tender and normal bowel sounds Musculoskeletal:no cyanosis of digits and no clubbing  NEURO: alert & oriented x 3 with fluent speech, no focal motor/sensory deficits Breasts: Breast inspection showed status post bilateral mastectomy.  Incisions are well-healed, no palpable masses on the chest wall.. Palpation of bilateral axilla revealed no obvious mass that I could appreciate.   LABORATORY DATA:  I have reviewed the data as listed CBC Latest Ref Rng & Units 09/07/2020 05/05/2020 05/05/2020  WBC 4.0 - 10.5 K/uL 6.2 5.1 -  Hemoglobin 12.0 -  15.0 g/dL 13.3 11.4(L) -  Hematocrit 36 - 46 % 40.1 36.6 35.9  Platelets 150 - 400 K/uL 233 251 -     CMP Latest Ref Rng & Units 05/05/2020 02/03/2020 12/06/2019  Glucose 70 - 99 mg/dL 102(H) 133(H) 187(H)  BUN 6 - 20 mg/dL _0 Creatinine 0.44 - 1.00 mg/dL 1.16(H) 1.19(H) 1.36(H)  Sodium 135 - 145 mmol/L 139 140 137  Potassium 3.5 - 5.1 mmol/L 4.0 3.9 4.4  Chloride 98 - 111 mmol/L 106 106 104  CO2 22 - 32 mmol/L _1 Calcium 8.9 - 10.3 mg/dL 9.6 8.8(L) 8.9  Total Protein 6.5 - 8.1 g/dL 7.7 7.2 7.5  Total Bilirubin 0.3 - 1.2 mg/dL 0.4 0.3 0.4  Alkaline Phos 38 - 126 U/L 120 98 113  AST 15 - 41 U/L 12(L) 17 21  ALT 0 - 44 U/L _2 RADIOGRAPHIC STUDIES: I have personally reviewed the radiological images as listed and agreed with the findings in the report. No results found.   ASSESSMENT & PLAN:  Sheila [name] is a 44 y.o. female with    1. Malignant neoplasm of upper-outer quadrant of right  breast in female, mixed invasive ductal and lobular carcinoma, pT3(m)N2aM0, stage 1b, ductal carcinoma triple positive, lobular carcinoma ER+/PR+/HER2-, Grade 2 -She was diagnosed in 05/2017. She is s/pb/lmastectomy, adjuvant TCHP and radiation and maintenance Herceptin/Perjeta and 1 year of Neratinib.  -She had PAC removed on 08/19/19. Her genetic testing was negative. -Shestarted adjuvant letrozolewith monthlyZoladexinjectionsin 04/2018.Letrozole was subsequently changed to anastrozole, then exemestane due to some joint and back pain. She toleratesexemestane well with hot flashes, tolerable. She has cancelled her BSO for now. She may proceed in 2022. Shewill continue Zoladex injectionsmonthly. -She is clinically doing well, exam unremarkable. Lab reviewed.  We will proceed with Zoladex injection today and continue every 4 weeks -Continue exemestane, she is tolerating well -Follow-up in 5 months  2.H/ovaginal bleeding -Started 04/12/19 with cramping, light to moderate flow, fist time since she started Zoladex injections on 03/20/18.Has not recurred. -I checked her estradiol levelwas less than 5,inpostmenopausalrange. Irecommendedour Gyn here such as Dr. Denman George once she is ready to proceed with surgery or if she has any concerns. -Sheis still considering proceeding withBSO.  3. Hot flash -Mostlytolerable. ContinueEffexor235m daily. -Will monitor on exemestane  4. Bone Health -Continue Vitamin D supplement and Vitamin C rich foodand do weight bearing exercise.  -Her 08/2020 baseline DEXA was normal with lowest T-score -0.6 at left hip.    5. Back pain, knee pain  -Her CT Chest on 04/06/18 showed sclerotic lesions of spine in T3 and L1, buther bone scan in 07/2018 was negative  -MRI lumbar from 01/28/19 shows lesions of L1 and L2indeterminate, will continue follow up -She will continueFlexeril and aleve for pain.Manageable on AI  6.  Iron deficient  anemia  -New anemia with Hg 10.9 on 02/03/20, MCV normal. No clinical signs of bleeding, ferritin was low in July 2021 -She has been taking over-the-counter iron pill, and multivitamins, not very consistent -Anemia resolved now. -I will give her stool card for OB, to see if she needs a colonoscopy.  PLAN: -Proceed with Zoladex todayand continue every 4 weeks -Continue Exemestane  -Lab and f/u in5 months -stool card for OBX3   No problem-specific Assessment & Plan notes found for this encounter.   No orders of the defined types were placed in this encounter.  All questions were answered. The  patient knows to call the clinic with any problems, questions or concerns. No barriers to learning was detected. The total time spent in the appointment was 30 minutes.     [name], MD 09/07/2020   I, Joslyn Devon, am acting as scribe for [name], MD.   I have reviewed the above documentation for accuracy and completeness, and I agree with the above.

## 2020-09-07 ENCOUNTER — Inpatient Hospital Stay (HOSPITAL_BASED_OUTPATIENT_CLINIC_OR_DEPARTMENT_OTHER): Payer: BC Managed Care – PPO | Admitting: Hematology

## 2020-09-07 ENCOUNTER — Inpatient Hospital Stay: Payer: BC Managed Care – PPO

## 2020-09-07 ENCOUNTER — Inpatient Hospital Stay: Payer: BC Managed Care – PPO | Attending: Nurse Practitioner

## 2020-09-07 ENCOUNTER — Other Ambulatory Visit: Payer: Self-pay

## 2020-09-07 ENCOUNTER — Encounter: Payer: Self-pay | Admitting: Hematology

## 2020-09-07 VITALS — BP 90/59 | HR 108 | Temp 98.2°F | Resp 18 | Ht 66.0 in | Wt 180.5 lb

## 2020-09-07 DIAGNOSIS — Z923 Personal history of irradiation: Secondary | ICD-10-CM | POA: Diagnosis not present

## 2020-09-07 DIAGNOSIS — D509 Iron deficiency anemia, unspecified: Secondary | ICD-10-CM | POA: Diagnosis not present

## 2020-09-07 DIAGNOSIS — Z79811 Long term (current) use of aromatase inhibitors: Secondary | ICD-10-CM | POA: Diagnosis not present

## 2020-09-07 DIAGNOSIS — Z9010 Acquired absence of unspecified breast and nipple: Secondary | ICD-10-CM | POA: Insufficient documentation

## 2020-09-07 DIAGNOSIS — C50411 Malignant neoplasm of upper-outer quadrant of right female breast: Secondary | ICD-10-CM | POA: Insufficient documentation

## 2020-09-07 DIAGNOSIS — Z23 Encounter for immunization: Secondary | ICD-10-CM | POA: Diagnosis not present

## 2020-09-07 DIAGNOSIS — Z170 Estrogen receptor positive status [ER+]: Secondary | ICD-10-CM

## 2020-09-07 DIAGNOSIS — Z95828 Presence of other vascular implants and grafts: Secondary | ICD-10-CM

## 2020-09-07 LAB — CBC WITH DIFFERENTIAL/PLATELET
Abs Immature Granulocytes: 0.02 10*3/uL (ref 0.00–0.07)
Basophils Absolute: 0.1 10*3/uL (ref 0.0–0.1)
Basophils Relative: 1 %
Eosinophils Absolute: 0.2 10*3/uL (ref 0.0–0.5)
Eosinophils Relative: 3 %
HCT: 40.1 % (ref 36.0–46.0)
Hemoglobin: 13.3 g/dL (ref 12.0–15.0)
Immature Granulocytes: 0 %
Lymphocytes Relative: 24 %
Lymphs Abs: 1.5 10*3/uL (ref 0.7–4.0)
MCH: 30 pg (ref 26.0–34.0)
MCHC: 33.2 g/dL (ref 30.0–36.0)
MCV: 90.5 fL (ref 80.0–100.0)
Monocytes Absolute: 0.4 10*3/uL (ref 0.1–1.0)
Monocytes Relative: 6 %
Neutro Abs: 4.1 10*3/uL (ref 1.7–7.7)
Neutrophils Relative %: 66 %
Platelets: 233 10*3/uL (ref 150–400)
RBC: 4.43 MIL/uL (ref 3.87–5.11)
RDW: 13.8 % (ref 11.5–15.5)
WBC: 6.2 10*3/uL (ref 4.0–10.5)
nRBC: 0 % (ref 0.0–0.2)

## 2020-09-07 LAB — COMPREHENSIVE METABOLIC PANEL
ALT: 14 U/L (ref 0–44)
AST: 15 U/L (ref 15–41)
Albumin: 4.1 g/dL (ref 3.5–5.0)
Alkaline Phosphatase: 115 U/L (ref 38–126)
Anion gap: 8 (ref 5–15)
BUN: 10 mg/dL (ref 6–20)
CO2: 30 mmol/L (ref 22–32)
Calcium: 9.8 mg/dL (ref 8.9–10.3)
Chloride: 104 mmol/L (ref 98–111)
Creatinine, Ser: 1.12 mg/dL — ABNORMAL HIGH (ref 0.44–1.00)
GFR, Estimated: >60 mL/min (ref >60)
Glucose, Bld: 113 mg/dL — ABNORMAL HIGH (ref 70–99)
Potassium: 4.2 mmol/L (ref 3.5–5.1)
Sodium: 142 mmol/L (ref 135–145)
Total Bilirubin: 0.3 mg/dL (ref 0.3–1.2)
Total Protein: 7.6 g/dL (ref 6.5–8.1)

## 2020-09-07 MED ORDER — INFLUENZA VAC SPLIT QUAD 0.5 ML IM SUSY
0.5000 mL | PREFILLED_SYRINGE | Freq: Once | INTRAMUSCULAR | Status: AC
  Administered 2020-09-07: 0.5 mL via INTRAMUSCULAR

## 2020-09-07 MED ORDER — INFLUENZA VAC SPLIT QUAD 0.5 ML IM SUSY
PREFILLED_SYRINGE | INTRAMUSCULAR | Status: AC
  Filled 2020-09-07: qty 0.5

## 2020-09-07 MED ORDER — GOSERELIN ACETATE 3.6 MG ~~LOC~~ IMPL
DRUG_IMPLANT | SUBCUTANEOUS | Status: AC
  Filled 2020-09-07: qty 3.6

## 2020-09-07 MED ORDER — GOSERELIN ACETATE 3.6 MG ~~LOC~~ IMPL
3.6000 mg | DRUG_IMPLANT | Freq: Once | SUBCUTANEOUS | Status: AC
  Administered 2020-09-07: 3.6 mg via SUBCUTANEOUS

## 2020-09-07 NOTE — Patient Instructions (Signed)
Influenza Virus Vaccine injection (Fluarix) What is this medicine? INFLUENZA VIRUS VACCINE (in floo EN zuh VAHY ruhs vak SEEN) helps to reduce the risk of getting influenza also known as the flu. This medicine may be used for other purposes; ask your health care provider or pharmacist if you have questions. COMMON BRAND NAME(S): Fluarix, Fluzone What should I tell my health care provider before I take this medicine? They need to know if you have any of these conditions:  bleeding disorder like hemophilia  fever or infection  Guillain-Barre syndrome or other neurological problems  immune system problems  infection with the human immunodeficiency virus (HIV) or AIDS  low blood platelet counts  multiple sclerosis  an unusual or allergic reaction to influenza virus vaccine, eggs, chicken proteins, latex, gentamicin, other medicines, foods, dyes or preservatives  pregnant or trying to get pregnant  breast-feeding How should I use this medicine? This vaccine is for injection into a muscle. It is given by a health care professional. A copy of Vaccine Information Statements will be given before each vaccination. Read this sheet carefully each time. The sheet may change frequently. Talk to your pediatrician regarding the use of this medicine in children. Special care may be needed. Overdosage: If you think you have taken too much of this medicine contact a poison control center or emergency room at once. NOTE: This medicine is only for you. Do not share this medicine with others. What if I miss a dose? This does not apply. What may interact with this medicine?  chemotherapy or radiation therapy  medicines that lower your immune system like etanercept, anakinra, infliximab, and adalimumab  medicines that treat or prevent blood clots like warfarin  phenytoin  steroid medicines like prednisone or cortisone  theophylline  vaccines This list may not describe all possible  interactions. Give your health care provider a list of all the medicines, herbs, non-prescription drugs, or dietary supplements you use. Also tell them if you smoke, drink alcohol, or use illegal drugs. Some items may interact with your medicine. What should I watch for while using this medicine? Report any side effects that do not go away within 3 days to your doctor or health care professional. Call your health care provider if any unusual symptoms occur within 6 weeks of receiving this vaccine. You may still catch the flu, but the illness is not usually as bad. You cannot get the flu from the vaccine. The vaccine will not protect against colds or other illnesses that may cause fever. The vaccine is needed every year. What side effects may I notice from receiving this medicine? Side effects that you should report to your doctor or health care professional as soon as possible:  allergic reactions like skin rash, itching or hives, swelling of the face, lips, or tongue Side effects that usually do not require medical attention (report to your doctor or health care professional if they continue or are bothersome):  fever  headache  muscle aches and pains  pain, tenderness, redness, or swelling at site where injected  weak or tired This list may not describe all possible side effects. Call your doctor for medical advice about side effects. You may report side effects to FDA at 1-800-FDA-1088. Where should I keep my medicine? This vaccine is only given in a clinic, pharmacy, doctor's office, or other health care setting and will not be stored at home. NOTE: This sheet is a summary. It may not cover all possible information. If you have questions   about this medicine, talk to your doctor, pharmacist, or health care provider.  2020 [employer] (2008-06-01 09:30:40)  

## 2020-09-08 ENCOUNTER — Telehealth: Payer: Self-pay | Admitting: Hematology

## 2020-09-08 LAB — CANCER ANTIGEN 27.29: CA 27.29: 5.3 U/mL (ref 0.0–38.6)

## 2020-09-08 NOTE — Telephone Encounter (Signed)
Scheduled appointment per 10/21 los. Spoke to patient who is aware of appointments dates and times.

## 2020-10-05 ENCOUNTER — Other Ambulatory Visit: Payer: Self-pay

## 2020-10-05 ENCOUNTER — Inpatient Hospital Stay: Payer: BC Managed Care – PPO | Attending: Nurse Practitioner

## 2020-10-05 ENCOUNTER — Ambulatory Visit: Payer: BC Managed Care – PPO

## 2020-10-05 VITALS — BP 109/83 | HR 105 | Resp 18

## 2020-10-05 DIAGNOSIS — Z79818 Long term (current) use of other agents affecting estrogen receptors and estrogen levels: Secondary | ICD-10-CM | POA: Insufficient documentation

## 2020-10-05 DIAGNOSIS — Z170 Estrogen receptor positive status [ER+]: Secondary | ICD-10-CM

## 2020-10-05 DIAGNOSIS — Z95828 Presence of other vascular implants and grafts: Secondary | ICD-10-CM

## 2020-10-05 DIAGNOSIS — C50411 Malignant neoplasm of upper-outer quadrant of right female breast: Secondary | ICD-10-CM | POA: Diagnosis not present

## 2020-10-05 MED ORDER — FULVESTRANT 250 MG/5ML IM SOLN
INTRAMUSCULAR | Status: AC
  Filled 2020-10-05: qty 5

## 2020-10-05 MED ORDER — GOSERELIN ACETATE 3.6 MG ~~LOC~~ IMPL
3.6000 mg | DRUG_IMPLANT | Freq: Once | SUBCUTANEOUS | Status: AC
  Administered 2020-10-05: 3.6 mg via SUBCUTANEOUS

## 2020-10-05 MED ORDER — GOSERELIN ACETATE 3.6 MG ~~LOC~~ IMPL
DRUG_IMPLANT | SUBCUTANEOUS | Status: AC
  Filled 2020-10-05: qty 3.6

## 2020-10-05 NOTE — Patient Instructions (Signed)

## 2020-10-07 ENCOUNTER — Other Ambulatory Visit: Payer: Self-pay | Admitting: Hematology

## 2020-11-02 ENCOUNTER — Inpatient Hospital Stay: Payer: BC Managed Care – PPO | Attending: Nurse Practitioner

## 2020-11-02 ENCOUNTER — Other Ambulatory Visit: Payer: Self-pay

## 2020-11-02 ENCOUNTER — Ambulatory Visit: Payer: BC Managed Care – PPO

## 2020-11-02 VITALS — BP 112/78 | HR 89 | Temp 98.2°F | Resp 18

## 2020-11-02 DIAGNOSIS — Z79818 Long term (current) use of other agents affecting estrogen receptors and estrogen levels: Secondary | ICD-10-CM | POA: Insufficient documentation

## 2020-11-02 DIAGNOSIS — Z170 Estrogen receptor positive status [ER+]: Secondary | ICD-10-CM | POA: Insufficient documentation

## 2020-11-02 DIAGNOSIS — C50411 Malignant neoplasm of upper-outer quadrant of right female breast: Secondary | ICD-10-CM | POA: Diagnosis not present

## 2020-11-02 DIAGNOSIS — Z95828 Presence of other vascular implants and grafts: Secondary | ICD-10-CM

## 2020-11-02 MED ORDER — GOSERELIN ACETATE 3.6 MG ~~LOC~~ IMPL
3.6000 mg | DRUG_IMPLANT | Freq: Once | SUBCUTANEOUS | Status: AC
  Administered 2020-11-02: 3.6 mg via SUBCUTANEOUS

## 2020-11-02 MED ORDER — GOSERELIN ACETATE 3.6 MG ~~LOC~~ IMPL
DRUG_IMPLANT | SUBCUTANEOUS | Status: AC
  Filled 2020-11-02: qty 3.6

## 2020-11-02 NOTE — Patient Instructions (Signed)

## 2020-11-27 ENCOUNTER — Other Ambulatory Visit: Payer: Self-pay | Admitting: Hematology

## 2020-11-27 ENCOUNTER — Other Ambulatory Visit: Payer: Self-pay

## 2020-11-27 DIAGNOSIS — C50411 Malignant neoplasm of upper-outer quadrant of right female breast: Secondary | ICD-10-CM

## 2020-11-27 MED ORDER — VENLAFAXINE HCL ER 75 MG PO CP24: ORAL_CAPSULE | ORAL | 5 refills | Status: DC

## 2020-11-30 ENCOUNTER — Inpatient Hospital Stay: Payer: BC Managed Care – PPO | Attending: Nurse Practitioner

## 2020-11-30 ENCOUNTER — Ambulatory Visit: Payer: BC Managed Care – PPO

## 2020-11-30 ENCOUNTER — Other Ambulatory Visit: Payer: Self-pay

## 2020-11-30 VITALS — BP 97/72 | HR 102 | Temp 98.6°F | Resp 17 | Ht 66.0 in | Wt 184.1 lb

## 2020-11-30 DIAGNOSIS — C50411 Malignant neoplasm of upper-outer quadrant of right female breast: Secondary | ICD-10-CM | POA: Diagnosis not present

## 2020-11-30 DIAGNOSIS — Z79811 Long term (current) use of aromatase inhibitors: Secondary | ICD-10-CM | POA: Diagnosis not present

## 2020-11-30 DIAGNOSIS — Z5111 Encounter for antineoplastic chemotherapy: Secondary | ICD-10-CM | POA: Diagnosis not present

## 2020-11-30 DIAGNOSIS — Z95828 Presence of other vascular implants and grafts: Secondary | ICD-10-CM

## 2020-11-30 DIAGNOSIS — Z170 Estrogen receptor positive status [ER+]: Secondary | ICD-10-CM | POA: Diagnosis not present

## 2020-11-30 MED ORDER — GOSERELIN ACETATE 3.6 MG ~~LOC~~ IMPL
3.6000 mg | DRUG_IMPLANT | Freq: Once | SUBCUTANEOUS | Status: AC
  Administered 2020-11-30: 3.6 mg via SUBCUTANEOUS

## 2020-11-30 MED ORDER — GOSERELIN ACETATE 3.6 MG ~~LOC~~ IMPL
DRUG_IMPLANT | SUBCUTANEOUS | Status: AC
  Filled 2020-11-30: qty 3.6

## 2020-12-28 ENCOUNTER — Inpatient Hospital Stay: Payer: BC Managed Care – PPO | Attending: Nurse Practitioner

## 2020-12-28 ENCOUNTER — Ambulatory Visit: Payer: BC Managed Care – PPO

## 2020-12-28 ENCOUNTER — Other Ambulatory Visit: Payer: Self-pay

## 2020-12-28 VITALS — BP 109/69 | HR 97 | Resp 18

## 2020-12-28 DIAGNOSIS — C50411 Malignant neoplasm of upper-outer quadrant of right female breast: Secondary | ICD-10-CM | POA: Diagnosis not present

## 2020-12-28 DIAGNOSIS — Z79818 Long term (current) use of other agents affecting estrogen receptors and estrogen levels: Secondary | ICD-10-CM | POA: Diagnosis not present

## 2020-12-28 DIAGNOSIS — Z170 Estrogen receptor positive status [ER+]: Secondary | ICD-10-CM | POA: Insufficient documentation

## 2020-12-28 DIAGNOSIS — Z95828 Presence of other vascular implants and grafts: Secondary | ICD-10-CM

## 2020-12-28 MED ORDER — GOSERELIN ACETATE 3.6 MG ~~LOC~~ IMPL
DRUG_IMPLANT | SUBCUTANEOUS | Status: AC
  Filled 2020-12-28: qty 3.6

## 2020-12-28 MED ORDER — GOSERELIN ACETATE 3.6 MG ~~LOC~~ IMPL
3.6000 mg | DRUG_IMPLANT | Freq: Once | SUBCUTANEOUS | Status: AC
  Administered 2020-12-28: 3.6 mg via SUBCUTANEOUS

## 2021-01-04 ENCOUNTER — Other Ambulatory Visit: Payer: Self-pay | Admitting: Hematology

## 2021-01-04 DIAGNOSIS — Z170 Estrogen receptor positive status [ER+]: Secondary | ICD-10-CM

## 2021-01-04 DIAGNOSIS — C50411 Malignant neoplasm of upper-outer quadrant of right female breast: Secondary | ICD-10-CM

## 2021-01-22 NOTE — Progress Notes (Signed)
[place]   [phone] [phone]   Clinic Follow up Note   Patient Care Team: [name], MD as PCP - General (Family Medicine) [name] (Family Medicine) [name], MD as Consulting Physician (General Surgery) [name], MD as Consulting Physician (Hematology) Kyung Rudd, MD as Consulting Physician (Radiation Oncology)  Date of Service:  01/25/2021  CHIEF COMPLAINT: F/u of right breast cancer  SUMMARY OF ONCOLOGIC HISTORY: Oncology History Overview Note   Cancer Staging Malignant neoplasm of upper-outer quadrant of right breast in female, estrogen receptor positive ([name]) Staging form: Breast, AJCC 8th Edition - Clinical stage from 06/11/2017: Stage IB (cT3, cN0, cM0, G2, ER: Positive, PR: Positive, HER2: Positive) - Signed by [name], MD on 06/16/2017 - Pathologic stage from 07/22/2017: Stage IB (pT3(m), pN2a(sn), cM0, G2, ER: Positive, PR: Positive, HER2: Positive) - Signed by [name], NP on 08/04/2017    Malignant neoplasm of upper-outer quadrant of right breast in female, estrogen receptor positive ([name])  06/11/2017 Initial Biopsy   Diagnosis 06/11/17 Breast, right, needle core biopsy, 11:00 o'clock - INVASIVE DUCTAL CARCINOMA, G2   06/11/2017 Mammogram   [name] and MM Diagnostic Breast Tomo Bilateral 06/11/17 IMPRESSION: 1. Highly suspicious mass within the retroareolar right breast, extending from the 8:00 to 11:00 axes, anterior to posterior depth, extending anteriorly to the nipple with associated nipple retraction, measuring at least 5.5 cm by ultrasound  2. No sonographic evidence of metastatic lymphadenopathy in the right axilla. 3. No evidence of malignancy within the left breast.    06/11/2017 Initial Diagnosis   Malignant neoplasm of upper-outer quadrant of right breast in female, estrogen receptor positive ([name])   06/11/2017 Receptors her2   Estrogen Receptor: 100%, POSITIVE, STRONG STAINING  INTENSITY Progesterone Receptor: 100%, POSITIVE, STRONG STAINING INTENSITY Proliferation Marker Ki67: 12%Proliferation Marker Ki67: 12%  HER2 - **POSITIVE** RATIO OF HER2/CEP17 SIGNALS 2.55 AVERAGE HER2 COPY NUMBER PER CELL 4.85   06/25/2017 Genetic Testing   Negative genetic testing on the 9 gene STAT panel.  The STAT Breast cancer panel offered by [facility] includes sequencing and rearrangement analysis for the following 9 genes:  ATM, BRCA1, BRCA2, CDH1, CHEK2, PALB2, PTEN, STK11 and TP53.   The report date is June 25, 2017.  Negative genetic testing on the common hereditary cancer panel.  The Hereditary Gene Panel offered by [facility] includes sequencing and/or deletion duplication testing of the following 46 genes: APC, ATM, AXIN2, BARD1, BMPR1A, BRCA1, BRCA2, BRIP1, CDH1, CDKN2A (p14ARF), CDKN2A (p16INK4a), CHEK2, CTNNA1, DICER1, EPCAM (Deletion/duplication testing only), GREM1 (promoter region deletion/duplication testing only), KIT, MEN1, MLH1, MSH2, MSH3, MSH6, MUTYH, NBN, NF1, NHTL1, PALB2, PDGFRA, PMS2, POLD1, POLE, PTEN, RAD50, RAD51C, RAD51D, SDHB, SDHC, SDHD, SMAD4, SMARCA4. STK11, TP53, TSC1, TSC2, and VHL.  The following genes were evaluated for sequence changes only: SDHA and HOXB13 c.251G>A variant only.  The report date is June 25, 2017.    07/02/2017 Imaging   CT cAP 07/02/17 IMPRESSION: 1. Subareolar right breast mass. No compelling findings of nodal or metastatic involvement. 2. Old granulomatous disease. 3. There is a 7 mm enhancing or hyperdense focus posteriorly in segment 7 of the liver in the subcapsular region. By virtue of its small size this lesion is technically nonspecific although statistically likely to be a small benign lesions such as flash filling hemangioma. This may warrant surveillance. 4.  Prominent stool throughout the colon favors constipation. 5. Degenerative disc disease and spondylosis at L5-S1 likely causing mild impingement.   07/02/2017 Imaging  Bone scan 07/02/17 IMPRESSION: Today' s exam is negative. I ascribed the tiny focus of activity just proximal to the left antecubital region to injection site.   07/22/2017 Surgery   RIGHT TOTAL MASTECTOMY WITH RIGHT AXILLARY SENTINEL LYMPH NODE BIOPSY, LEFT PROPHYLACTIC MASTECTOM and INSERTION PORT-A-CATH WITH [name] by [name] and [facility]    07/22/2017 Pathology Results   Diagnosis 1. Breast, simple mastectomy, Left - FIBROADENOMA. - NO MALIGNANCY IDENTIFIED. 2. 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 - ADDITIONAL FOCUS OF LOBULAR CARCINOMA, GRADE 2, SPANNING 2.2 CM. - INTERMEDIATE GRADE DUCTAL CARCINOMA IN SITU. - INVASIVE CARCINOMA COMES TO WITHIN 0.2 TO 0.3 CM OF THE DEEP MARGIN, FOCALLY. - TUMOR FOCALLY INVOLVES EPIDERMIS. - LYMPHOVASCULAR INVASION OF DERMAL LYMPHATICS. - ONE OF ONE LYMPH NODES NEGATIVE FOR CARCINOMA (0/1). - SEE ONCOLOGY TABLE. 3. Lymph node, sentinel, biopsy, Right axillary - ONE LYMPH NODE WITH ISOLATED TUMOR CELLS (0/1). 4. Lymph node, sentinel, biopsy, Right - METASTATIC CARCINOMA IN ONE OF ONE LYMPH NODES (1/1). 5. Lymph node, sentinel, biopsy, Right - ONE OF ONE LYMPH NODES NEGATIVE FOR CARCINOMA (0/1). 6. Lymph node, sentinel, biopsy, Right - METASTATIC CARCINOMA IN ONE OF ONE LYMPH NODES (1/1). - EXTRACAPSULAR EXTENSION. 7. Lymph node, sentinel, biopsy, Right - METASTATIC CARCINOMA IN ONE OF ONE LYMPH NODES (1/1). - EXTRACAPSULAR EXTENSION. 8. Lymph node, sentinel, biopsy, Right - ONE OF ONE LYMPH NODES NEGATIVE FOR CARCINOMA (0/1). 9. Lymph node, sentinel, biopsy, Right - ONE OF ONE LYMPH NODES NEGATIVE FOR CARCINOMA (0/1). 10. Lymph node, sentinel, biopsy, Right - METASTATIC CARCINOMA IN ONE OF ONE LYMPH NODES (1/1). - EXTRACAPSULAR EXTENSION. 11. Lymph node, biopsy, Right axillary - ONE OF ONE LYMPH NODES NEGATIVE FOR CARCINOMA (0/1).    07/22/2017 Receptors her2    Ductal carcinoma in primary breast tumor and node metastasis are ER 95%, PR 95% strongly positive, and HER2 + The lobular component in primary breast tumor is ER 95% positive, PR 95% positive, strong staining, HER-2 negative.   Original biopsy [id number]: Ductal morphology, Her2 FISH positive. Current specimen: Main focus is 75% ductal morphology, 25% lobular morphology. Smaller focus is lobular morphology. #2A Lobular morphology, Her IHC negative. #2E Lobular morphology, Her2 FISH negative, Her2 IHC negative. #4 50% lobular and 50% ductal morphology, Her2 FISH positive, Her2 IHC negative. #6 Ductal morphology #7 Ductal morphology #10 50% lobular, 50% ductal morphology   08/25/2017 Imaging   MRI abdomen done at [facility]:  Enhancing structure measing 5 mm is compatible with a flash filling hemangioma. No specific findings to suggest metastatic cancer   08/25/2017 Imaging   MRI Abdomen at [facility] 08/25/17  IMPRESSION: 1. Lesion in segment 7 of the liver has signal and enhancement characteristics compatible with a flash filling hemangioma. No specific findings identified to suggest metastatic disease.   08/29/2017 - 12/12/2017 Chemotherapy   adjuvant TCHP every 3 weeks for 6 cycles starting 08/29/17. Postponed and decreased Carbo to 657m and added Onpro with cycle 2 on 09/19/17 due to neutropenic fever. Carbplatin further reduced to 5050mstarting with cycle 3 due to thrombocytopenia. Herceptin was held for Cycle 4 and 5 due to worsening global longitudinal strain found on ECHO from 11/07/17. Completed 6 cycles on 12/12/17      09/04/2017 - 09/09/2017 Hospital Admission   Admit date: 09/04/2017 Discharge date: 09/09/2017  DISCHARGE DIAGNOSES:  Principal Problem:   [facility]is ([facility]:   Malignant neoplasm of upper-outer quadrant of right breast in female,  estrogen receptor positive ([place])   Port-A-Cath in place   Neutropenic fever ([place])   Hyponatremia   Malignant neoplasm of  breast in female, estrogen receptor positive ([place])    12/28/2017 Imaging   CT CHEST IMPRESSION: 1. 2.3 x 2.7 x 1.2 cm irregular masslike opacity within the right middle lobe worrisome for malignancy/metastasis. 2. 3 new very small sclerotic lesions, within the sternum and the body of T4, worrisome for new metastases.   12/28/2017 - 01/05/2018 Hospital Admission   Admit date: 12/28/17-01/05/18 Admission diagnosis: multifocal pneumonia, influenza A, sepsis, hypoxia Additional comments: imaging during hospitalization notable for new irregular masslike opacity in the right middle lobe worrisome for metastasis and new very small sclerotic lesion within the sternum and the body of T4, worrisome for metastasis   12/29/2017 Imaging   CT ABD/PELVIS IMPRESSION: No definite acute intra-abdominal or intrapelvic abnormalities.  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   12/30/2017 Imaging   CT HEAD IMPRESSION: No acute intracranial abnormality or enhancing lesion.   01/16/2018 - 01/22/2019 Chemotherapy   Maintence Herceptin and Perjeta every 3 weeks for 6-12 months starting 01/16/18. Held 03/2018-07/17/18 due to low EF. Completed on 01/22/19    02/04/2018 PET scan   IMPRESSION: 1. The previous consolidation in the right middle lobe currently has a more nodular and reticular appearance with some associated mild atelectasis along its anterior margin. This demonstrates low-grade metabolic activity with maximum SUV 3.2 (background blood pool activity is 2.7). Although conceivably related to low-grade residuum from the patient's malignancy, the presence of calcified right hilar lymph nodes and small calcifications along the nodularity raise suspicion for a low-grade active granulomatous process or atypical infectious process. Surveillance is recommended. 2. Scattered deposits of hypermetabolic but benign metabolically active brown fat.   02/23/2018 - 04/09/2018 Radiation  Therapy   Radiation treatment dates:   02/23/2018 - 04/09/2018  Site/dose:   The patient initially received a dose of 50.4 Gy in 28 fractions to the right chest wall and supraclavicular region. This was delivered using a 3-D conformal, 4 field technique. The patient then received a boost to the mastectomy scar. This delivered an additional 10 Gy in 5 fractions using an en face electron field. The total dose was 60.4 Gy.  Narrative: The patient tolerated radiation treatment relatively well.   The patient had some expected skin irritation as she progressed during treatment. Moist desquamation was not present at the end of treatment.   03/20/2018 -  Anti-estrogen oral therapy   -Monthly Zoladex injection starting 03/20/18  -Letrozole 2.79m once dialy starting 05/03/18  -Switched to Anastrozole starting 07/2018 Due to her worsening back pain. -Changed to Exemestane in 04/2019 due to back pain exacerbation.    04/06/2018 Imaging   CT Chest wo contrast IMPRESSION: 1. Stable linear nodular thickening in the RIGHT middle lobe is favored post infectious or inflammatory scarring. 2. Calcified RIGHT hilar lymph node is consistent post infectious process. 3. Stable small LEFT upper lobe nodule. 4. Stable sclerotic lesion in the manubrium. Sclerotic lesion at T3 is more prominent (4 mm). Sclerotic lesion at L 1 is new from 07/02/2017.   04/06/2018 Imaging   04/06/2018 CT Chest IMPRESSION: 1. Stable linear nodular thickening in the RIGHT middle lobe is favored post infectious or inflammatory scarring. 2. Calcified RIGHT hilar lymph node is consistent post infectious process. 3. Stable small LEFT upper lobe nodule. 4. Stable sclerotic lesion in the manubrium. Sclerotic lesion at T3 is more prominent (4 mm).  Sclerotic lesion at L 1 is new from 07/02/2017.   04/10/2018 Echocardiogram   LV EF 45%-50%   05/25/2018 Echocardiogram   LV EF 45%-50%   07/13/2018 Echocardiogram   Normal LV size with EF  55-60%   07/31/2018 Imaging   07/31/2018 Bone Scan IMPRESSION: No evidence of skeletal metastatic disease.   07/31/2018 Imaging   07/31/2018 CT CAP IMPRESSION: Residual linear and nodular peribronchovascular opacities in the right middle lobe, with other scattered areas of ground-glass opacities in the right middle, right upper and right lower lobes, which are located within the previously demonstrated bulky airspace consolidation. The findings are improved from the chest CT dated 12/30/2017, and relatively stable from PET-CT dated 02/04/2018. These may represent post infectious/post inflammatory changes or residual malignancy.  Stable partially calcified right hilar lymphadenopathy.  Interval development of nodular soft tissue thickening within the anterior mediastinum in the area of the thymus, new from chest CT dated 12/30/2017. This may represent reactive thymic tissue, thymoma or lymphadenopathy.  1.8 cm probably physiologic left ovarian cyst. Borderline thickening of the endometrium measuring 15.6 mm. Further evaluation with pelvic ultrasound may be considered.  No new or suspicious findings within the abdomen.   09/15/2018 Echocardiogram   EF 55-60%   01/28/2019 Imaging   MRI Lumbar spie 01/28/19  IMPRESSION: 1. Subcentimeter lesions in the L1 and L2 vertebral bodies, indeterminate for metastases. 2. Mild lower lumbar disc degeneration without stenosis.   01/28/2019 Imaging   CT chest 01/28/19  IMPRESSION: Right lung post radiation changes. No active lung disease or other acute findings.  No evidence of recurrent or metastatic carcinoma within the thorax.   03/15/2019 - 02/2020 Chemotherapy   Anti-HER2 Neratinib 240 mg daily, starting 03/15/19. She completed 1 year of treatment in 02/2020      CURRENT THERAPY:  -Monthly Zoladex injections starting 03/20/18  -Letrozole 2.76m once daily starting 05/03/18, changed to anastrozole in 07/2018. Changed to Exemestane in  04/2019 due to back pain exacerbation.  INTERVAL HISTORY:  AZurri Ruddenis here for a follow up of right breast cancer. She presents to the clinic alone. She is doing well, mild fatigue, she has mild join pain in knees, but it does not limit walking etc. She has not been exercise.  No new pain, appetite is good.  Mild hot flash is tolerable, no vaginal dryness, weight is stable. She has been working full time since last year.   All other systems were reviewed with the patient and are negative.  MEDICAL HISTORY:  Past Medical History:  Diagnosis Date  . Anxiety   . Cancer ([facility]    right breast   . Cardiomyopathy ([place]   . Family history of breast cancer   . Family history of colon cancer   . GERD (gastroesophageal reflux disease)   . Headache    couple of migraines in the past  . History of kidney stones   . Vitamin D deficiency     SURGICAL HISTORY: Past Surgical History:  Procedure Laterality Date  . egg donation    . kidney stone removal    . MASS EXCISION Right 08/19/2019   Procedure: REMOVE EXCESS TISSUE FROM RIGHT LATERAL MASTECTOMY SCAR;  Surgeon: WRolm Bookbinder MD;  [place] [facility]  Service: General;  Laterality: Right;  . MASTECTOMY W/ SENTINEL NODE BIOPSY Bilateral 07/22/2017   Procedure: RIGHT TOTAL MASTECTOMY WITH RIGHT AXILLARY SENTINEL LYMPH NODE BIOPSY, LEFT PROPHYLACTIC MASTECTOMY;  Surgeon: WRolm Bookbinder MD;  [place]  Service:  General;  Laterality: Bilateral;  . PORT-A-CATH REMOVAL Right 08/19/2019   Procedure: REMOVAL PORT-A-CATH;  Surgeon: [name], MD;  Location: [place];  Service: General;  Laterality: Right;  . PORTACATH PLACEMENT Right 07/22/2017   Procedure: INSERTION PORT-A-CATH WITH [name];  Surgeon: [name], MD;  Location: [place];  Service: General;  Laterality: Right;    I have reviewed the social history and family history with the patient and they are unchanged from previous  note.  ALLERGIES:  is allergic to eszopiclone.  MEDICATIONS:  Current Outpatient Medications  Medication Sig Dispense Refill  . B Complex-C (B-COMPLEX WITH VITAMIN C) tablet Take 1 tablet by mouth daily.    . calcium-vitamin D (OSCAL WITH D) 250-125 MG-UNIT tablet Take 1 tablet by mouth daily.    . Cholecalciferol (VITAMIN D) 2000 units tablet Take 2,000 Units by mouth daily.    . cyclobenzaprine (FLEXERIL) 5 MG tablet Take 1 tablet (5 mg total) by mouth 2 (two) times daily as needed for muscle spasms. 30 tablet 0  . diphenoxylate-atropine (LOMOTIL) 2.5-0.025 MG tablet Take 1-2 tablets by mouth 4 (four) times daily as needed for diarrhea or loose stools. 30 tablet 3  . exemestane (AROMASIN) 25 MG tablet TAKE 1 TABLET (25 MG TOTAL) BY MOUTH DAILY AFTER BREAKFAST. 30 tablet 5  . fluticasone (FLONASE) 50 MCG/ACT nasal spray Place into both nostrils daily.    Marland Kitchen levothyroxine (SYNTHROID, LEVOTHROID) 25 MCG tablet Take 25 mcg by mouth daily before breakfast.    . loperamide (IMODIUM) 2 MG capsule Take 1-2 capsules (2-4 mg total) by mouth 3 (three) times daily as needed for diarrhea or loose stools. 120 capsule 0  . Melatonin 10 MG TABS Take 0.5 tablets by mouth.     . naproxen sodium (ANAPROX) 220 MG tablet Take 220 mg 2 (two) times daily as needed by mouth (pain).     . prochlorperazine (COMPAZINE) 10 MG tablet Take 1 tablet (10 mg total) by mouth every 6 (six) hours as needed (Nausea or vomiting). 45 tablet 3  . venlafaxine XR (EFFEXOR-XR) 75 MG 24 hr capsule TAKE 3 CAPSULES BY MOUTH EVERY DAY WITH BREAKFAST 90 capsule 5   Current Facility-Administered Medications  Medication Dose Route Frequency Provider Last Rate Last Admin  . gi cocktail (Maalox,Lidocaine,Donnatal)  30 mL Oral Once Harle Stanford., PA-C        PHYSICAL EXAMINATION: ECOG PERFORMANCE STATUS: 1 - Symptomatic but completely ambulatory  Vitals:   01/25/21 1344  BP: 128/75  Pulse: (!) 102  Resp: 20  Temp: 98.7 F (37.1 C)   SpO2: 100%   Filed Weights   01/25/21 1344  Weight: 183 lb 11.2 oz (83.3 kg)    GENERAL:alert, no distress and comfortable SKIN: skin color, texture, turgor are normal, no rashes or significant lesions EYES: normal, Conjunctiva are pink and non-injected, sclera clear NECK: supple, thyroid normal size, non-tender, without nodularity LYMPH:  no palpable lymphadenopathy in the cervical, axillary  LUNGS: clear to auscultation and percussion with normal breathing effort HEART: regular rate & rhythm and no murmurs and no lower extremity edema ABDOMEN:abdomen soft, non-tender and normal bowel sounds Musculoskeletal:no cyanosis of digits and no clubbing  NEURO: alert & oriented x 3 with fluent speech, no focal motor/sensory deficits BREAST: Status post bilateral mastectomy, surgical incisions well-healed, no palpable nodules, skin change, or tenderness, no palpable axillary adenopathy.  LABORATORY DATA:  I have reviewed the data as listed CBC Latest Ref Rng & Units 01/25/2021 09/07/2020 05/05/2020  WBC 4.0 - 10.5 K/uL 5.7 6.2 5.1  Hemoglobin 12.0 - 15.0 g/dL 12.6 13.3 11.4(L)  Hematocrit 36.0 - 46.0 % 38.3 40.1 36.6  Platelets 150 - 400 K/uL 211 233 251     CMP Latest Ref Rng & Units 09/07/2020 05/05/2020 02/03/2020  Glucose 70 - 99 mg/dL 113(H) 102(H) 133(H)  BUN 6 - 20 mg/dL '10 9 10  ' Creatinine 0.44 - 1.00 mg/dL 1.12(H) 1.16(H) 1.19(H)  Sodium 135 - 145 mmol/L 142 139 140  Potassium 3.5 - 5.1 mmol/L 4.2 4.0 3.9  Chloride 98 - 111 mmol/L 104 106 106  CO2 22 - 32 mmol/L '30 25 24  ' Calcium 8.9 - 10.3 mg/dL 9.8 9.6 8.8(L)  Total Protein 6.5 - 8.1 g/dL 7.6 7.7 7.2  Total Bilirubin 0.3 - 1.2 mg/dL 0.3 0.4 0.3  Alkaline Phos 38 - 126 U/L 115 120 98  AST 15 - 41 U/L 15 12(L) 17  ALT 0 - 44 U/L '14 11 16      ' RADIOGRAPHIC STUDIES: I have personally reviewed the radiological images as listed and agreed with the findings in the report. No results found.   ASSESSMENT & PLAN:  Sheila [name] is a 45 y.o. female with   1. Malignant neoplasm of upper-outer quadrant of right breast in female, mixed invasive ductal and lobular carcinoma, pT3(m)N2aM0, stage 1b, ductal carcinoma triple positive, lobular carcinoma ER+/PR+/HER2-, Grade 2 -She was diagnosed in 05/2017. She is s/pb/lmastectomy, adjuvant TCHP and radiation and maintenance Herceptin/Perjeta and 1 year of Neratinib.  -She had PAC removedon 08/19/19. Her genetic testing was negative. -Shestarted adjuvant letrozolewith monthlyZoladexinjectionsin 04/2018.Letrozole was subsequently changed to anastrozole, then exemestane due to some joint and back pain. She toleratesexemestane well with mild hot flashes, tolerable.She has not decided when to have her BSO yet, maybe later this year. Shewill continue Zoladex injectionsmonthly. -She is clinically doing very well, with mild hot flashes and occasional arthralgia, overall tolerating exemestane well.  Lab reviewed, exam was unremarkable, no clinical concern for recurrence. -We will continue Zoladex injection every 4 weeks, follow-up in 16 weeks    2.H/ovaginal bleeding -Started 04/12/19 with cramping, light to moderate flow, fist time since she started Zoladex injections on 03/20/18.Has not recurred. -I checked her estradiol levelwas less than 5,inpostmenopausalrange. Irecommendedour Gyn here such as Dr. Denman George once she is ready to proceed with surgery or if she has any concerns. -Sheis still considering proceeding withBSO.  3. Hot flash -Mostlytolerable. ContinueEffexor267m daily. -Will monitor on exemestane  4. Bone Health -Continue Vitamin D supplement and Vitamin C rich foodand do weight bearing exercise. -Her 08/2020 baseline DEXA was normal with lowest T-score -0.6 at left hip.   5. Back pain, knee pain -Her CT Chest on 04/06/18 showed sclerotic lesions of spine in T3 and L1, buther bone scan in 07/2018 was negative  -MRI lumbar  from 01/28/19 shows lesions of L1 and L2indeterminate, will continue follow up -She will continueFlexeril and aleve for pain.Manageable on AI -I encouraged her to exercise  6.  Iron deficient anemia  -New anemia withHg 10.9on 02/03/20, MCV normal. No clinical signs of bleeding, ferritin was low in July 2021 -She has been taking over-the-counter iron pill, and multivitamins, not very consistent -Anemia resolved now. -I gave her stool card for OB, to see if she needs a colonoscopy. She will mail it back   PLAN: -Proceed with Zoladex todayand continue every 4 weeks -Continue Exemestane -Lab and f/u with Lacie in4 months -I gave her stool card today for  OBX3   No problem-specific Assessment & Plan notes found for this encounter.   No orders of the defined types were placed in this encounter.  All questions were answered. The patient knows to call the clinic with any problems, questions or concerns. No barriers to learning was detected. The total time spent in the appointment was 30 minutes.     [name], MD 01/25/2021   I, Joslyn Devon, am acting as scribe for [name], MD.   I have reviewed the above documentation for accuracy and completeness, and I agree with the above.

## 2021-01-25 ENCOUNTER — Other Ambulatory Visit: Payer: Self-pay

## 2021-01-25 ENCOUNTER — Inpatient Hospital Stay: Payer: BC Managed Care – PPO

## 2021-01-25 ENCOUNTER — Inpatient Hospital Stay (HOSPITAL_BASED_OUTPATIENT_CLINIC_OR_DEPARTMENT_OTHER): Payer: BC Managed Care – PPO | Admitting: Hematology

## 2021-01-25 ENCOUNTER — Encounter: Payer: Self-pay | Admitting: Hematology

## 2021-01-25 ENCOUNTER — Inpatient Hospital Stay: Payer: BC Managed Care – PPO | Attending: Nurse Practitioner

## 2021-01-25 VITALS — BP 128/75 | HR 102 | Temp 98.7°F | Resp 20 | Ht 66.0 in | Wt 183.7 lb

## 2021-01-25 DIAGNOSIS — M25569 Pain in unspecified knee: Secondary | ICD-10-CM | POA: Diagnosis not present

## 2021-01-25 DIAGNOSIS — C50411 Malignant neoplasm of upper-outer quadrant of right female breast: Secondary | ICD-10-CM

## 2021-01-25 DIAGNOSIS — Z9011 Acquired absence of right breast and nipple: Secondary | ICD-10-CM | POA: Diagnosis not present

## 2021-01-25 DIAGNOSIS — Z170 Estrogen receptor positive status [ER+]: Secondary | ICD-10-CM | POA: Insufficient documentation

## 2021-01-25 DIAGNOSIS — Z79899 Other long term (current) drug therapy: Secondary | ICD-10-CM | POA: Insufficient documentation

## 2021-01-25 DIAGNOSIS — Z9221 Personal history of antineoplastic chemotherapy: Secondary | ICD-10-CM | POA: Insufficient documentation

## 2021-01-25 DIAGNOSIS — Z95828 Presence of other vascular implants and grafts: Secondary | ICD-10-CM

## 2021-01-25 DIAGNOSIS — Z79811 Long term (current) use of aromatase inhibitors: Secondary | ICD-10-CM | POA: Diagnosis not present

## 2021-01-25 DIAGNOSIS — R232 Flushing: Secondary | ICD-10-CM | POA: Diagnosis not present

## 2021-01-25 DIAGNOSIS — Z803 Family history of malignant neoplasm of breast: Secondary | ICD-10-CM | POA: Diagnosis not present

## 2021-01-25 DIAGNOSIS — D509 Iron deficiency anemia, unspecified: Secondary | ICD-10-CM | POA: Insufficient documentation

## 2021-01-25 DIAGNOSIS — Z923 Personal history of irradiation: Secondary | ICD-10-CM | POA: Insufficient documentation

## 2021-01-25 DIAGNOSIS — M549 Dorsalgia, unspecified: Secondary | ICD-10-CM | POA: Diagnosis not present

## 2021-01-25 LAB — CBC WITH DIFFERENTIAL/PLATELET
Abs Immature Granulocytes: 0.01 10*3/uL (ref 0.00–0.07)
Basophils Absolute: 0.1 10*3/uL (ref 0.0–0.1)
Basophils Relative: 1 %
Eosinophils Absolute: 0.2 10*3/uL (ref 0.0–0.5)
Eosinophils Relative: 3 %
HCT: 38.3 % (ref 36.0–46.0)
Hemoglobin: 12.6 g/dL (ref 12.0–15.0)
Immature Granulocytes: 0 %
Lymphocytes Relative: 34 %
Lymphs Abs: 2 10*3/uL (ref 0.7–4.0)
MCH: 29.2 pg (ref 26.0–34.0)
MCHC: 32.9 g/dL (ref 30.0–36.0)
MCV: 88.7 fL (ref 80.0–100.0)
Monocytes Absolute: 0.3 10*3/uL (ref 0.1–1.0)
Monocytes Relative: 6 %
Neutro Abs: 3.2 10*3/uL (ref 1.7–7.7)
Neutrophils Relative %: 56 %
Platelets: 211 10*3/uL (ref 150–400)
RBC: 4.32 MIL/uL (ref 3.87–5.11)
RDW: 12.7 % (ref 11.5–15.5)
WBC: 5.7 10*3/uL (ref 4.0–10.5)
nRBC: 0 % (ref 0.0–0.2)

## 2021-01-25 LAB — COMPREHENSIVE METABOLIC PANEL
ALT: 15 U/L (ref 0–44)
AST: 14 U/L — ABNORMAL LOW (ref 15–41)
Albumin: 3.9 g/dL (ref 3.5–5.0)
Alkaline Phosphatase: 118 U/L (ref 38–126)
Anion gap: 5 (ref 5–15)
BUN: 10 mg/dL (ref 6–20)
CO2: 28 mmol/L (ref 22–32)
Calcium: 9.3 mg/dL (ref 8.9–10.3)
Chloride: 106 mmol/L (ref 98–111)
Creatinine, Ser: 1.02 mg/dL — ABNORMAL HIGH (ref 0.44–1.00)
GFR, Estimated: >60 mL/min (ref >60)
Glucose, Bld: 116 mg/dL — ABNORMAL HIGH (ref 70–99)
Potassium: 3.8 mmol/L (ref 3.5–5.1)
Sodium: 139 mmol/L (ref 135–145)
Total Bilirubin: 0.3 mg/dL (ref 0.3–1.2)
Total Protein: 7.4 g/dL (ref 6.5–8.1)

## 2021-01-25 MED ORDER — VENLAFAXINE HCL ER 75 MG PO CP24: 225.0000 mg | ORAL_CAPSULE | Freq: Every day | ORAL | 1 refills | Status: DC

## 2021-01-25 MED ORDER — GOSERELIN ACETATE 3.6 MG ~~LOC~~ IMPL
3.6000 mg | DRUG_IMPLANT | Freq: Once | SUBCUTANEOUS | Status: AC
Start: 2021-01-25 — End: 2021-01-25
  Administered 2021-01-25: 3.6 mg via SUBCUTANEOUS

## 2021-01-25 MED ORDER — GOSERELIN ACETATE 3.6 MG ~~LOC~~ IMPL
DRUG_IMPLANT | SUBCUTANEOUS | Status: AC
  Filled 2021-01-25: qty 3.6

## 2021-01-25 NOTE — Patient Instructions (Signed)

## 2021-01-26 ENCOUNTER — Encounter: Payer: Self-pay | Admitting: Hematology

## 2021-01-26 LAB — CANCER ANTIGEN 27.29: CA 27.29: 8.5 U/mL (ref 0.0–38.6)

## 2021-02-22 ENCOUNTER — Inpatient Hospital Stay: Payer: BC Managed Care – PPO | Attending: Nurse Practitioner

## 2021-02-22 ENCOUNTER — Other Ambulatory Visit: Payer: Self-pay

## 2021-02-22 VITALS — BP 108/75 | HR 101 | Resp 16

## 2021-02-22 DIAGNOSIS — C50411 Malignant neoplasm of upper-outer quadrant of right female breast: Secondary | ICD-10-CM | POA: Diagnosis not present

## 2021-02-22 DIAGNOSIS — Z79818 Long term (current) use of other agents affecting estrogen receptors and estrogen levels: Secondary | ICD-10-CM | POA: Diagnosis not present

## 2021-02-22 DIAGNOSIS — Z170 Estrogen receptor positive status [ER+]: Secondary | ICD-10-CM | POA: Insufficient documentation

## 2021-02-22 DIAGNOSIS — Z95828 Presence of other vascular implants and grafts: Secondary | ICD-10-CM

## 2021-02-22 MED ORDER — GOSERELIN ACETATE 3.6 MG ~~LOC~~ IMPL
3.6000 mg | DRUG_IMPLANT | Freq: Once | SUBCUTANEOUS | Status: AC
  Administered 2021-02-22: 3.6 mg via SUBCUTANEOUS

## 2021-02-22 MED ORDER — GOSERELIN ACETATE 3.6 MG ~~LOC~~ IMPL
DRUG_IMPLANT | SUBCUTANEOUS | Status: AC
  Filled 2021-02-22: qty 3.6

## 2021-02-22 NOTE — Patient Instructions (Signed)

## 2021-03-22 ENCOUNTER — Inpatient Hospital Stay: Payer: BC Managed Care – PPO | Attending: Nurse Practitioner

## 2021-03-26 ENCOUNTER — Telehealth: Payer: Self-pay | Admitting: Hematology

## 2021-03-26 NOTE — Telephone Encounter (Signed)
R/s appt per 5/9 sch msg. Pt aware.  

## 2021-03-27 ENCOUNTER — Other Ambulatory Visit: Payer: Self-pay

## 2021-03-27 ENCOUNTER — Inpatient Hospital Stay: Payer: BC Managed Care – PPO | Attending: Nurse Practitioner

## 2021-03-27 ENCOUNTER — Other Ambulatory Visit: Payer: Self-pay | Admitting: Hematology

## 2021-03-27 VITALS — BP 115/75 | HR 102 | Temp 98.5°F

## 2021-03-27 DIAGNOSIS — C50411 Malignant neoplasm of upper-outer quadrant of right female breast: Secondary | ICD-10-CM | POA: Insufficient documentation

## 2021-03-27 DIAGNOSIS — Z95828 Presence of other vascular implants and grafts: Secondary | ICD-10-CM

## 2021-03-27 DIAGNOSIS — Z170 Estrogen receptor positive status [ER+]: Secondary | ICD-10-CM | POA: Diagnosis not present

## 2021-03-27 DIAGNOSIS — Z79818 Long term (current) use of other agents affecting estrogen receptors and estrogen levels: Secondary | ICD-10-CM | POA: Diagnosis not present

## 2021-03-27 MED ORDER — GOSERELIN ACETATE 3.6 MG ~~LOC~~ IMPL
3.6000 mg | DRUG_IMPLANT | Freq: Once | SUBCUTANEOUS | Status: AC
Start: 2021-03-27 — End: 2021-03-27
  Administered 2021-03-27: 3.6 mg via SUBCUTANEOUS

## 2021-03-27 MED ORDER — GOSERELIN ACETATE 3.6 MG ~~LOC~~ IMPL
DRUG_IMPLANT | SUBCUTANEOUS | Status: AC
  Filled 2021-03-27: qty 3.6

## 2021-04-19 ENCOUNTER — Encounter: Payer: Self-pay | Admitting: Hematology

## 2021-04-19 ENCOUNTER — Inpatient Hospital Stay: Payer: BC Managed Care – PPO | Attending: Nurse Practitioner

## 2021-04-19 ENCOUNTER — Other Ambulatory Visit: Payer: Self-pay

## 2021-04-19 VITALS — BP 100/74 | HR 84 | Temp 98.4°F | Resp 16

## 2021-04-19 DIAGNOSIS — Z79811 Long term (current) use of aromatase inhibitors: Secondary | ICD-10-CM | POA: Insufficient documentation

## 2021-04-19 DIAGNOSIS — Z170 Estrogen receptor positive status [ER+]: Secondary | ICD-10-CM | POA: Diagnosis not present

## 2021-04-19 DIAGNOSIS — Z9013 Acquired absence of bilateral breasts and nipples: Secondary | ICD-10-CM | POA: Diagnosis not present

## 2021-04-19 DIAGNOSIS — C50411 Malignant neoplasm of upper-outer quadrant of right female breast: Secondary | ICD-10-CM | POA: Diagnosis not present

## 2021-04-19 DIAGNOSIS — Z95828 Presence of other vascular implants and grafts: Secondary | ICD-10-CM

## 2021-04-19 MED ORDER — GOSERELIN ACETATE 3.6 MG ~~LOC~~ IMPL
3.6000 mg | DRUG_IMPLANT | Freq: Once | SUBCUTANEOUS | Status: AC
  Administered 2021-04-19: 3.6 mg via SUBCUTANEOUS

## 2021-04-19 MED ORDER — GOSERELIN ACETATE 3.6 MG ~~LOC~~ IMPL
DRUG_IMPLANT | SUBCUTANEOUS | Status: AC
  Filled 2021-04-19: qty 3.6

## 2021-04-19 NOTE — Patient Instructions (Signed)

## 2021-04-20 ENCOUNTER — Encounter: Payer: Self-pay | Admitting: Hematology

## 2021-05-02 ENCOUNTER — Encounter: Payer: Self-pay | Admitting: Hematology

## 2021-05-16 ENCOUNTER — Telehealth: Payer: Self-pay | Admitting: Hematology

## 2021-05-16 NOTE — Progress Notes (Deleted)
[place]   [phone] [phone]   Clinic Follow up Note   Patient Care Team: [name], MD as PCP - General (Family Medicine) [name] (Family Medicine) [name], MD as Consulting Physician (General Surgery) [name], MD as Consulting Physician (Hematology) Kyung Rudd, MD as Consulting Physician (Radiation Oncology) 05/16/2021  CHIEF COMPLAINT: Follow-up right breast cancer  SUMMARY OF ONCOLOGIC HISTORY: Oncology History Overview Note   Cancer Staging Malignant neoplasm of upper-outer quadrant of right breast in female, estrogen receptor positive ([place]) Staging form: Breast, AJCC 8th Edition - Clinical stage from 06/11/2017: Stage IB (cT3, cN0, cM0, G2, ER: Positive, PR: Positive, HER2: Positive) - Signed by [name], MD on 06/16/2017 - Pathologic stage from 07/22/2017: Stage IB (pT3(m), pN2a(sn), cM0, G2, ER: Positive, PR: Positive, HER2: Positive) - Signed by [name], NP on 08/04/2017     Malignant neoplasm of upper-outer quadrant of right breast in female, estrogen receptor positive ([place])  06/11/2017 Initial Biopsy   Diagnosis 06/11/17 Breast, right, needle core biopsy, 11:00 o'clock - INVASIVE DUCTAL CARCINOMA, G2    06/11/2017 Mammogram   [name] and MM Diagnostic Breast Tomo Bilateral 06/11/17 IMPRESSION: 1. Highly suspicious mass within the retroareolar right breast, extending from the 8:00 to 11:00 axes, anterior to posterior depth, extending anteriorly to the nipple with associated nipple retraction, measuring at least 5.5 cm by ultrasound   2. No sonographic evidence of metastatic lymphadenopathy in the right axilla.  3. No evidence of malignancy within the left breast.     06/11/2017 Initial Diagnosis   Malignant neoplasm of upper-outer quadrant of right breast in female, estrogen receptor positive ([place])    06/11/2017 Receptors her2   Estrogen Receptor: 100%, POSITIVE, STRONG STAINING  INTENSITY Progesterone Receptor: 100%, POSITIVE, STRONG STAINING INTENSITY Proliferation Marker Ki67: 12%Proliferation Marker Ki67: 12%  HER2 - **POSITIVE** RATIO OF HER2/CEP17 SIGNALS 2.55 AVERAGE HER2 COPY NUMBER PER CELL 4.85    06/25/2017 Genetic Testing   Negative genetic testing on the 9 gene STAT panel.  The STAT Breast cancer panel offered by [facility] includes sequencing and rearrangement analysis for the following 9 genes:  ATM, BRCA1, BRCA2, CDH1, CHEK2, PALB2, PTEN, STK11 and TP53.   The report date is June 25, 2017.  Negative genetic testing on the common hereditary cancer panel.  The Hereditary Gene Panel offered by [facility] includes sequencing and/or deletion duplication testing of the following 46 genes: APC, ATM, AXIN2, BARD1, BMPR1A, BRCA1, BRCA2, BRIP1, CDH1, CDKN2A (p14ARF), CDKN2A (p16INK4a), CHEK2, CTNNA1, DICER1, EPCAM (Deletion/duplication testing only), GREM1 (promoter region deletion/duplication testing only), KIT, MEN1, MLH1, MSH2, MSH3, MSH6, MUTYH, NBN, NF1, NHTL1, PALB2, PDGFRA, PMS2, POLD1, POLE, PTEN, RAD50, RAD51C, RAD51D, SDHB, SDHC, SDHD, SMAD4, SMARCA4. STK11, TP53, TSC1, TSC2, and VHL.  The following genes were evaluated for sequence changes only: SDHA and HOXB13 c.251G>A variant only.  The report date is June 25, 2017.     07/02/2017 Imaging   CT cAP 07/02/17 IMPRESSION: 1. Subareolar right breast mass. No compelling findings of nodal or metastatic involvement. 2. Old granulomatous disease. 3. There is a 7 mm enhancing or hyperdense focus posteriorly in segment 7 of the liver in the subcapsular region. By virtue of its small size this lesion is technically nonspecific although statistically likely to be a small benign lesions such as flash filling hemangioma. This may warrant surveillance. 4.  Prominent stool throughout the colon favors constipation. 5. Degenerative disc disease and spondylosis at L5-S1 likely causing mild impingement.  07/02/2017  Imaging   Bone scan 07/02/17 IMPRESSION: Today' s exam is negative. I ascribed the tiny focus of activity just proximal to the left antecubital region to injection site.    07/22/2017 Surgery   RIGHT TOTAL MASTECTOMY WITH RIGHT AXILLARY SENTINEL LYMPH NODE BIOPSY, LEFT PROPHYLACTIC MASTECTOM and INSERTION PORT-A-CATH WITH [name] by [name] and [facility]     07/22/2017 Pathology Results   Diagnosis 1. Breast, simple mastectomy, Left - FIBROADENOMA. - NO MALIGNANCY IDENTIFIED. 2. 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 - ADDITIONAL FOCUS OF LOBULAR CARCINOMA, GRADE 2, SPANNING 2.2 CM. - INTERMEDIATE GRADE DUCTAL CARCINOMA IN SITU. - INVASIVE CARCINOMA COMES TO WITHIN 0.2 TO 0.3 CM OF THE DEEP MARGIN, FOCALLY. - TUMOR FOCALLY INVOLVES EPIDERMIS. - LYMPHOVASCULAR INVASION OF DERMAL LYMPHATICS. - ONE OF ONE LYMPH NODES NEGATIVE FOR CARCINOMA (0/1). - SEE ONCOLOGY TABLE. 3. Lymph node, sentinel, biopsy, Right axillary - ONE LYMPH NODE WITH ISOLATED TUMOR CELLS (0/1). 4. Lymph node, sentinel, biopsy, Right - METASTATIC CARCINOMA IN ONE OF ONE LYMPH NODES (1/1). 5. Lymph node, sentinel, biopsy, Right - ONE OF ONE LYMPH NODES NEGATIVE FOR CARCINOMA (0/1). 6. Lymph node, sentinel, biopsy, Right - METASTATIC CARCINOMA IN ONE OF ONE LYMPH NODES (1/1). - EXTRACAPSULAR EXTENSION. 7. Lymph node, sentinel, biopsy, Right - METASTATIC CARCINOMA IN ONE OF ONE LYMPH NODES (1/1). - EXTRACAPSULAR EXTENSION. 8. Lymph node, sentinel, biopsy, Right - ONE OF ONE LYMPH NODES NEGATIVE FOR CARCINOMA (0/1). 9. Lymph node, sentinel, biopsy, Right - ONE OF ONE LYMPH NODES NEGATIVE FOR CARCINOMA (0/1). 10. Lymph node, sentinel, biopsy, Right - METASTATIC CARCINOMA IN ONE OF ONE LYMPH NODES (1/1). - EXTRACAPSULAR EXTENSION. 11. Lymph node, biopsy, Right axillary - ONE OF ONE LYMPH NODES NEGATIVE FOR CARCINOMA (0/1).     07/22/2017 Receptors  her2   Ductal carcinoma in primary breast tumor and node metastasis are ER 95%, PR 95% strongly positive, and HER2 + The lobular component in primary breast tumor is ER 95% positive, PR 95% positive, strong staining, HER-2 negative.   Original biopsy [id number]: Ductal morphology, Her2 FISH positive. Current specimen: Main focus is 75% ductal morphology, 25% lobular morphology. Smaller focus is lobular morphology. #2A Lobular morphology, Her IHC negative. #2E Lobular morphology, Her2 FISH negative, Her2 IHC negative. #4 50% lobular and 50% ductal morphology, Her2 FISH positive, Her2 IHC negative. #6 Ductal morphology #7 Ductal morphology #10 50% lobular, 50% ductal morphology    08/25/2017 Imaging   MRI abdomen done at [facility]:  Enhancing structure measing 5 mm is compatible with a flash filling hemangioma. No specific findings to suggest metastatic cancer    08/25/2017 Imaging   MRI Abdomen at [facility] 08/25/17  IMPRESSION: 1. Lesion in segment 7 of the liver has signal and enhancement characteristics compatible with a flash filling hemangioma. No specific findings identified to suggest metastatic disease.   08/29/2017 - 12/12/2017 Chemotherapy   adjuvant TCHP every 3 weeks for 6 cycles starting 08/29/17. Postponed and decreased Carbo to 624m and added Onpro with cycle 2 on 09/19/17 due to neutropenic fever. Carbplatin further reduced to 5036mstarting with cycle 3 due to thrombocytopenia. Herceptin was held for Cycle 4 and 5 due to worsening global longitudinal strain found on ECHO from 11/07/17. Completed 6 cycles on 12/12/17       09/04/2017 - 09/09/2017 Hospital Admission   Admit date: 09/04/2017 Discharge date: 09/09/2017   DISCHARGE DIAGNOSES:  Principal Problem:   [facility]:  Malignant neoplasm of upper-outer quadrant of right breast in female, estrogen receptor positive ([place])   Port-A-Cath in place   Neutropenic fever ([place])   Hyponatremia    Malignant neoplasm of breast in female, estrogen receptor positive ([place])      12/28/2017 Imaging   CT CHEST IMPRESSION: 1. 2.3 x 2.7 x 1.2 cm irregular masslike opacity within the right middle lobe worrisome for malignancy/metastasis. 2. 3 new very small sclerotic lesions, within the sternum and the body of T4, worrisome for new metastases.    12/28/2017 - 01/05/2018 Hospital Admission   Admit date: 12/28/17-01/05/18 Admission diagnosis: multifocal pneumonia, influenza A, sepsis, hypoxia Additional comments: imaging during hospitalization notable for new irregular masslike opacity in the right middle lobe worrisome for metastasis and new very small sclerotic lesion within the sternum and the body of T4, worrisome for metastasis    12/29/2017 Imaging   CT ABD/PELVIS IMPRESSION: No definite acute intra-abdominal or intrapelvic abnormalities.   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    12/30/2017 Imaging   CT HEAD IMPRESSION: No acute intracranial abnormality or enhancing lesion.    01/16/2018 - 01/22/2019 Chemotherapy   Maintence Herceptin and Perjeta every 3 weeks for 6-12 months starting 01/16/18. Held 03/2018-07/17/18 due to low EF. Completed on 01/22/19    02/04/2018 PET scan   IMPRESSION: 1. The previous consolidation in the right middle lobe currently has a more nodular and reticular appearance with some associated mild atelectasis along its anterior margin. This demonstrates low-grade metabolic activity with maximum SUV 3.2 (background blood pool activity is 2.7). Although conceivably related to low-grade residuum from the patient's malignancy, the presence of calcified right hilar lymph nodes and small calcifications along the nodularity raise suspicion for a low-grade active granulomatous process or atypical infectious process. Surveillance is recommended. 2. Scattered deposits of hypermetabolic but benign metabolically active brown fat.     02/23/2018 - 04/09/2018 Radiation Therapy   Radiation treatment dates:   02/23/2018 - 04/09/2018   Site/dose:   The patient initially received a dose of 50.4 Gy in 28 fractions to the right chest wall and supraclavicular region. This was delivered using a 3-D conformal, 4 field technique. The patient then received a boost to the mastectomy scar. This delivered an additional 10 Gy in 5 fractions using an en face electron field. The total dose was 60.4 Gy.   Narrative: The patient tolerated radiation treatment relatively well.   The patient had some expected skin irritation as she progressed during treatment. Moist desquamation was not present at the end of treatment.    03/20/2018 -  Anti-estrogen oral therapy   -Monthly Zoladex injection starting 03/20/18  -Letrozole 2.83m once dialy starting 05/03/18  -Switched to Anastrozole starting 07/2018 Due to her worsening back pain. -Changed to Exemestane in 04/2019 due to back pain exacerbation.    04/06/2018 Imaging   CT Chest wo contrast IMPRESSION: 1. Stable linear nodular thickening in the RIGHT middle lobe is favored post infectious or inflammatory scarring. 2. Calcified RIGHT hilar lymph node is consistent post infectious process. 3. Stable small LEFT upper lobe nodule. 4. Stable sclerotic lesion in the manubrium. Sclerotic lesion at T3 is more prominent (4 mm). Sclerotic lesion at L 1 is new from 07/02/2017.    04/06/2018 Imaging   04/06/2018 CT Chest IMPRESSION: 1. Stable linear nodular thickening in the RIGHT middle lobe is favored post infectious or inflammatory scarring. 2. Calcified RIGHT hilar lymph node is consistent post infectious process. 3.  Stable small LEFT upper lobe nodule. 4. Stable sclerotic lesion in the manubrium. Sclerotic lesion at T3 is more prominent (4 mm). Sclerotic lesion at L 1 is new from 07/02/2017.    04/10/2018 Echocardiogram   LV EF 45%-50%    05/25/2018 Echocardiogram   LV EF 45%-50%    07/13/2018  Echocardiogram   Normal LV size with EF 55-60%   07/31/2018 Imaging   07/31/2018 Bone Scan IMPRESSION: No evidence of skeletal metastatic disease.    07/31/2018 Imaging   07/31/2018 CT CAP IMPRESSION: Residual linear and nodular peribronchovascular opacities in the right middle lobe, with other scattered areas of ground-glass opacities in the right middle, right upper and right lower lobes, which are located within the previously demonstrated bulky airspace consolidation. The findings are improved from the chest CT dated 12/30/2017, and relatively stable from PET-CT dated 02/04/2018. These may represent post infectious/post inflammatory changes or residual malignancy.   Stable partially calcified right hilar lymphadenopathy.   Interval development of nodular soft tissue thickening within the anterior mediastinum in the area of the thymus, new from chest CT dated 12/30/2017. This may represent reactive thymic tissue, thymoma or lymphadenopathy.   1.8 cm probably physiologic left ovarian cyst. Borderline thickening of the endometrium measuring 15.6 mm. Further evaluation with pelvic ultrasound may be considered.   No new or suspicious findings within the abdomen.    09/15/2018 Echocardiogram   EF 55-60%    01/28/2019 Imaging   MRI Lumbar spie 01/28/19  IMPRESSION: 1. Subcentimeter lesions in the L1 and L2 vertebral bodies, indeterminate for metastases. 2. Mild lower lumbar disc degeneration without stenosis.    01/28/2019 Imaging   CT chest 01/28/19  IMPRESSION: Right lung post radiation changes. No active lung disease or other acute findings.   No evidence of recurrent or metastatic carcinoma within the thorax.    03/15/2019 - 02/2020 Chemotherapy   Anti-HER2 Neratinib 240 mg daily, starting 03/15/19. She completed 1 year of treatment in 02/2020     CURRENT THERAPY:  -Monthly Zoladex injections starting 03/20/18 -Letrozole 2.84m once daily starting 05/03/18, changed to  anastrozole in 07/2018. Changed to Exemestane in 04/2019 due to back pain exacerbation.   INTERVAL HISTORY: Ms. LCockerellreturns for follow-up as scheduled.  She was last seen by Dr. FBurr Medico3/2022, continues Zoladex monthly and exemestane.   REVIEW OF SYSTEMS:   Constitutional: Denies fevers, chills or abnormal weight loss Eyes: Denies blurriness of vision Ears, nose, mouth, throat, and face: Denies mucositis or sore throat Respiratory: Denies cough, dyspnea or wheezes Cardiovascular: Denies palpitation, chest discomfort or lower extremity swelling Gastrointestinal:  Denies nausea, heartburn or change in bowel habits Skin: Denies abnormal skin rashes Lymphatics: Denies new lymphadenopathy or easy bruising Neurological:Denies numbness, tingling or new weaknesses Behavioral/Psych: Mood is stable, no new changes  All other systems were reviewed with the patient and are negative.  MEDICAL HISTORY:  Past Medical History:  Diagnosis Date   Anxiety    [place]cer ([place]    right breast    Cardiomyopathy ([place]    Family history of breast cancer    Family history of colon cancer    GERD (gastroesophageal reflux disease)    Headache    couple of migraines in the past   History of kidney stones    Vitamin D deficiency     SURGICAL HISTORY: Past Surgical History:  Procedure Laterality Date   egg donation     kidney stone removal     MASS EXCISION Right 08/19/2019  Procedure: REMOVE EXCESS TISSUE FROM RIGHT LATERAL MASTECTOMY SCAR;  Surgeon: [name], MD;  Location: [place];  Service: General;  Laterality: Right;   MASTECTOMY W/ SENTINEL NODE BIOPSY Bilateral 07/22/2017   Procedure: RIGHT TOTAL MASTECTOMY WITH RIGHT AXILLARY SENTINEL LYMPH NODE BIOPSY, LEFT PROPHYLACTIC MASTECTOMY;  Surgeon: [name], MD;  Location: [place];  Service: General;  Laterality: Bilateral;   PORT-A-CATH REMOVAL Right 08/19/2019   Procedure: REMOVAL PORT-A-CATH;  Surgeon: [name], MD;  Location: [place];  Service: General;  Laterality: Right;   PORTACATH PLACEMENT Right 07/22/2017   Procedure: INSERTION PORT-A-CATH WITH [name];  Surgeon: [name], MD;  Location: [place];  Service: General;  Laterality: Right;    I have reviewed the social history and family history with the patient and they are unchanged from previous note.  ALLERGIES:  is allergic to eszopiclone.  MEDICATIONS:  Current Outpatient Medications  Medication Sig Dispense Refill   B Complex-C (B-COMPLEX WITH VITAMIN C) tablet Take 1 tablet by mouth daily.     calcium-vitamin D (OSCAL WITH D) 250-125 MG-UNIT tablet Take 1 tablet by mouth daily.     Cholecalciferol (VITAMIN D) 2000 units tablet Take 2,000 Units by mouth daily.     exemestane (AROMASIN) 25 MG tablet TAKE 1 TABLET BY MOUTH EVERY DAY AFTER BREAKFAST 90 tablet 3   fluticasone (FLONASE) 50 MCG/ACT nasal spray Place into both nostrils daily.     levothyroxine (SYNTHROID, LEVOTHROID) 25 MCG tablet Take 25 mcg by mouth daily before breakfast.     Melatonin 10 MG TABS Take 0.5 tablets by mouth.      naproxen sodium (ANAPROX) 220 MG tablet Take 220 mg 2 (two) times daily as needed by mouth (pain).      venlafaxine XR (EFFEXOR-XR) 75 MG 24 hr capsule Take 3 capsules (225 mg total) by mouth daily with breakfast. 180 capsule 1   Current Facility-Administered Medications  Medication Dose Route Frequency Provider Last Rate Last Admin   gi cocktail (Maalox,Lidocaine,Donnatal)  30 mL Oral Once Harle Stanford., PA-C        PHYSICAL EXAMINATION: ECOG PERFORMANCE STATUS: {CHL ONC ECOG EH:2122482500}  There were no vitals filed for this visit. There were no vitals filed for this visit.  GENERAL:alert, no distress and comfortable SKIN: skin color, texture, turgor are normal, no rashes or significant lesions EYES: normal, Conjunctiva are pink and non-injected, sclera clear OROPHARYNX:no exudate, no erythema and lips, buccal  mucosa, and tongue normal  NECK: supple, thyroid normal size, non-tender, without nodularity LYMPH:  no palpable lymphadenopathy in the cervical, axillary or inguinal LUNGS: clear to auscultation and percussion with normal breathing effort HEART: regular rate & rhythm and no murmurs and no lower extremity edema ABDOMEN:abdomen soft, non-tender and normal bowel sounds Musculoskeletal:no cyanosis of digits and no clubbing  NEURO: alert & oriented x 3 with fluent speech, no focal motor/sensory deficits  LABORATORY DATA:  I have reviewed the data as listed CBC Latest Ref Rng & Units 01/25/2021 09/07/2020 05/05/2020  WBC 4.0 - 10.5 K/uL 5.7 6.2 5.1  Hemoglobin 12.0 - 15.0 g/dL 12.6 13.3 11.4(L)  Hematocrit 36.0 - 46.0 % 38.3 40.1 36.6  Platelets 150 - 400 K/uL 211 233 251     CMP Latest Ref Rng & Units 01/25/2021 09/07/2020 05/05/2020  Glucose 70 - 99 mg/dL 116(H) 113(H) 102(H)  BUN 6 - 20 mg/dL '10 10 9  ' Creatinine 0.44 - 1.00 mg/dL 1.02(H) 1.12(H) 1.16(H)  Sodium 135 - 145  mmol/L 139 142 139  Potassium 3.5 - 5.1 mmol/L 3.8 4.2 4.0  Chloride 98 - 111 mmol/L 106 104 106  CO2 22 - 32 mmol/L '28 30 25  ' Calcium 8.9 - 10.3 mg/dL 9.3 9.8 9.6  Total Protein 6.5 - 8.1 g/dL 7.4 7.6 7.7  Total Bilirubin 0.3 - 1.2 mg/dL 0.3 0.3 0.4  Alkaline Phos 38 - 126 U/L 118 115 120  AST 15 - 41 U/L 14(L) 15 12(L)  ALT 0 - 44 U/L '15 14 11      ' RADIOGRAPHIC STUDIES: I have personally reviewed the radiological images as listed and agreed with the findings in the report. No results found.   ASSESSMENT & PLAN:  No problem-specific Assessment & Plan notes found for this encounter.   No orders of the defined types were placed in this encounter.  All questions were answered. The patient knows to call the clinic with any problems, questions or concerns. No barriers to learning was detected. I spent {CHL ONC TIME VISIT - OJJKK:[id number]} counseling the patient face to face. The total time spent in the  appointment was {CHL ONC TIME VISIT - JIRCV:[id number]} and more than 50% was on counseling and review of test results     [name], NP 05/16/21

## 2021-05-16 NOTE — Telephone Encounter (Signed)
Rescheduled upcoming appointment due to provider not in office. Patient is aware of changes. 

## 2021-05-17 ENCOUNTER — Ambulatory Visit: Payer: BC Managed Care – PPO

## 2021-05-17 ENCOUNTER — Other Ambulatory Visit: Payer: BC Managed Care – PPO

## 2021-05-17 ENCOUNTER — Ambulatory Visit: Payer: BC Managed Care – PPO | Admitting: Nurse Practitioner

## 2021-05-18 ENCOUNTER — Other Ambulatory Visit: Payer: Self-pay

## 2021-05-18 ENCOUNTER — Inpatient Hospital Stay (HOSPITAL_BASED_OUTPATIENT_CLINIC_OR_DEPARTMENT_OTHER): Payer: BC Managed Care – PPO | Admitting: Hematology

## 2021-05-18 ENCOUNTER — Inpatient Hospital Stay: Payer: BC Managed Care – PPO

## 2021-05-18 ENCOUNTER — Encounter: Payer: Self-pay | Admitting: Hematology

## 2021-05-18 ENCOUNTER — Inpatient Hospital Stay: Payer: BC Managed Care – PPO | Attending: Nurse Practitioner

## 2021-05-18 VITALS — BP 117/78 | HR 110 | Temp 98.0°F | Resp 18 | Wt 185.5 lb

## 2021-05-18 DIAGNOSIS — Z95828 Presence of other vascular implants and grafts: Secondary | ICD-10-CM

## 2021-05-18 DIAGNOSIS — N951 Menopausal and female climacteric states: Secondary | ICD-10-CM | POA: Insufficient documentation

## 2021-05-18 DIAGNOSIS — Z780 Asymptomatic menopausal state: Secondary | ICD-10-CM | POA: Insufficient documentation

## 2021-05-18 DIAGNOSIS — M549 Dorsalgia, unspecified: Secondary | ICD-10-CM | POA: Insufficient documentation

## 2021-05-18 DIAGNOSIS — Z9013 Acquired absence of bilateral breasts and nipples: Secondary | ICD-10-CM | POA: Diagnosis not present

## 2021-05-18 DIAGNOSIS — C50411 Malignant neoplasm of upper-outer quadrant of right female breast: Secondary | ICD-10-CM

## 2021-05-18 DIAGNOSIS — Z170 Estrogen receptor positive status [ER+]: Secondary | ICD-10-CM | POA: Insufficient documentation

## 2021-05-18 DIAGNOSIS — Z79811 Long term (current) use of aromatase inhibitors: Secondary | ICD-10-CM | POA: Insufficient documentation

## 2021-05-18 DIAGNOSIS — Z5111 Encounter for antineoplastic chemotherapy: Secondary | ICD-10-CM | POA: Insufficient documentation

## 2021-05-18 DIAGNOSIS — M8580 Other specified disorders of bone density and structure, unspecified site: Secondary | ICD-10-CM | POA: Insufficient documentation

## 2021-05-18 DIAGNOSIS — N939 Abnormal uterine and vaginal bleeding, unspecified: Secondary | ICD-10-CM | POA: Diagnosis not present

## 2021-05-18 DIAGNOSIS — M25569 Pain in unspecified knee: Secondary | ICD-10-CM | POA: Insufficient documentation

## 2021-05-18 LAB — CBC WITH DIFFERENTIAL/PLATELET
Abs Immature Granulocytes: 0 10*3/uL (ref 0.00–0.07)
Basophils Absolute: 0.1 10*3/uL (ref 0.0–0.1)
Basophils Relative: 1 %
Eosinophils Absolute: 0.2 10*3/uL (ref 0.0–0.5)
Eosinophils Relative: 4 %
HCT: 39.3 % (ref 36.0–46.0)
Hemoglobin: 13 g/dL (ref 12.0–15.0)
Immature Granulocytes: 0 %
Lymphocytes Relative: 35 %
Lymphs Abs: 1.8 10*3/uL (ref 0.7–4.0)
MCH: 29.1 pg (ref 26.0–34.0)
MCHC: 33.1 g/dL (ref 30.0–36.0)
MCV: 88.1 fL (ref 80.0–100.0)
Monocytes Absolute: 0.4 10*3/uL (ref 0.1–1.0)
Monocytes Relative: 9 %
Neutro Abs: 2.7 10*3/uL (ref 1.7–7.7)
Neutrophils Relative %: 51 %
Platelets: 233 10*3/uL (ref 150–400)
RBC: 4.46 MIL/uL (ref 3.87–5.11)
RDW: 13.3 % (ref 11.5–15.5)
WBC: 5.2 10*3/uL (ref 4.0–10.5)
nRBC: 0 % (ref 0.0–0.2)

## 2021-05-18 LAB — COMPREHENSIVE METABOLIC PANEL
ALT: 13 U/L (ref 0–44)
AST: 15 U/L (ref 15–41)
Albumin: 3.9 g/dL (ref 3.5–5.0)
Alkaline Phosphatase: 119 U/L (ref 38–126)
Anion gap: 12 (ref 5–15)
BUN: 12 mg/dL (ref 6–20)
CO2: 25 mmol/L (ref 22–32)
Calcium: 9.5 mg/dL (ref 8.9–10.3)
Chloride: 106 mmol/L (ref 98–111)
Creatinine, Ser: 1.07 mg/dL — ABNORMAL HIGH (ref 0.44–1.00)
GFR, Estimated: >60 mL/min (ref >60)
Glucose, Bld: 108 mg/dL — ABNORMAL HIGH (ref 70–99)
Potassium: 3.8 mmol/L (ref 3.5–5.1)
Sodium: 143 mmol/L (ref 135–145)
Total Bilirubin: 0.4 mg/dL (ref 0.3–1.2)
Total Protein: 7.6 g/dL (ref 6.5–8.1)

## 2021-05-18 MED ORDER — GOSERELIN ACETATE 3.6 MG ~~LOC~~ IMPL
DRUG_IMPLANT | SUBCUTANEOUS | Status: AC
  Filled 2021-05-18: qty 3.6

## 2021-05-18 MED ORDER — GOSERELIN ACETATE 3.6 MG ~~LOC~~ IMPL
3.6000 mg | DRUG_IMPLANT | Freq: Once | SUBCUTANEOUS | Status: AC
  Administered 2021-05-18: 3.6 mg via SUBCUTANEOUS

## 2021-05-18 NOTE — Progress Notes (Signed)
[place]   Telephone:([phone]) [phone] [phone]) [phone]   Clinic Follow up Note   Patient Care Team: Kristen Loader, FNP as PCP - General (Family Medicine) [name] (Family Medicine) [name], MD as Consulting Physician (General Surgery) [name], MD as Consulting Physician (Hematology) Kyung Rudd, MD as Consulting Physician (Radiation Oncology)  Date of Service:  05/18/2021  CHIEF COMPLAINT: f/u of right breast cancer  SUMMARY OF ONCOLOGIC HISTORY: Oncology History Overview Note   Cancer Staging Malignant neoplasm of upper-outer quadrant of right breast in female, estrogen receptor positive ([place]) Staging form: Breast, AJCC 8th Edition - Clinical stage from 06/11/2017: Stage IB (cT3, cN0, cM0, G2, ER: Positive, PR: Positive, HER2: Positive) - Signed by [name], MD on 06/16/2017 - Pathologic stage from 07/22/2017: Stage IB (pT3(m), pN2a(sn), cM0, G2, ER: Positive, PR: Positive, HER2: Positive) - Signed by [name], NP on 08/04/2017     Malignant neoplasm of upper-outer quadrant of right breast in female, estrogen receptor positive ([place])  06/11/2017 Initial Biopsy   Diagnosis 06/11/17 Breast, right, needle core biopsy, 11:00 o'clock - INVASIVE DUCTAL CARCINOMA, G2    06/11/2017 Mammogram   [name] and MM Diagnostic Breast Tomo Bilateral 06/11/17 IMPRESSION: 1. Highly suspicious mass within the retroareolar right breast, extending from the 8:00 to 11:00 axes, anterior to posterior depth, extending anteriorly to the nipple with associated nipple retraction, measuring at least 5.5 cm by ultrasound   2. No sonographic evidence of metastatic lymphadenopathy in the right axilla.  3. No evidence of malignancy within the left breast.     06/11/2017 Initial Diagnosis   Malignant neoplasm of upper-outer quadrant of right breast in female, estrogen receptor positive ([place])    06/11/2017 Receptors her2   Estrogen Receptor: 100%, POSITIVE, STRONG STAINING  INTENSITY Progesterone Receptor: 100%, POSITIVE, STRONG STAINING INTENSITY Proliferation Marker Ki67: 12%Proliferation Marker Ki67: 12%  HER2 - **POSITIVE** RATIO OF HER2/CEP17 SIGNALS 2.55 AVERAGE HER2 COPY NUMBER PER CELL 4.85    06/25/2017 Genetic Testing   Negative genetic testing on the 9 gene STAT panel.  The STAT Breast cancer panel offered by [facility] includes sequencing and rearrangement analysis for the following 9 genes:  ATM, BRCA1, BRCA2, CDH1, CHEK2, PALB2, PTEN, STK11 and TP53.   The report date is June 25, 2017.  Negative genetic testing on the common hereditary cancer panel.  The Hereditary Gene Panel offered by [facility] includes sequencing and/or deletion duplication testing of the following 46 genes: APC, ATM, AXIN2, BARD1, BMPR1A, BRCA1, BRCA2, BRIP1, CDH1, CDKN2A (p14ARF), CDKN2A (p16INK4a), CHEK2, CTNNA1, DICER1, EPCAM (Deletion/duplication testing only), GREM1 (promoter region deletion/duplication testing only), KIT, MEN1, MLH1, MSH2, MSH3, MSH6, MUTYH, NBN, NF1, NHTL1, PALB2, PDGFRA, PMS2, POLD1, POLE, PTEN, RAD50, RAD51C, RAD51D, SDHB, SDHC, SDHD, SMAD4, SMARCA4. STK11, TP53, TSC1, TSC2, and VHL.  The following genes were evaluated for sequence changes only: SDHA and HOXB13 c.251G>A variant only.  The report date is June 25, 2017.     07/02/2017 Imaging   CT cAP 07/02/17 IMPRESSION: 1. Subareolar right breast mass. No compelling findings of nodal or metastatic involvement. 2. Old granulomatous disease. 3. There is a 7 mm enhancing or hyperdense focus posteriorly in segment 7 of the liver in the subcapsular region. By virtue of its small size this lesion is technically nonspecific although statistically likely to be a small benign lesions such as flash filling hemangioma. This may warrant surveillance. 4.  Prominent stool throughout the colon favors constipation. 5. Degenerative disc disease and spondylosis at L5-S1  likely causing mild impingement.    07/02/2017  Imaging   Bone scan 07/02/17 IMPRESSION: Today' s exam is negative. I ascribed the tiny focus of activity just proximal to the left antecubital region to injection site.    07/22/2017 Surgery   RIGHT TOTAL MASTECTOMY WITH RIGHT AXILLARY SENTINEL LYMPH NODE BIOPSY, LEFT PROPHYLACTIC MASTECTOM and INSERTION PORT-A-CATH WITH [name] by [name] and [facility]     07/22/2017 Pathology Results   Diagnosis 1. Breast, simple mastectomy, Left - FIBROADENOMA. - NO MALIGNANCY IDENTIFIED. 2. 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 - ADDITIONAL FOCUS OF LOBULAR CARCINOMA, GRADE 2, SPANNING 2.2 CM. - INTERMEDIATE GRADE DUCTAL CARCINOMA IN SITU. - INVASIVE CARCINOMA COMES TO WITHIN 0.2 TO 0.3 CM OF THE DEEP MARGIN, FOCALLY. - TUMOR FOCALLY INVOLVES EPIDERMIS. - LYMPHOVASCULAR INVASION OF DERMAL LYMPHATICS. - ONE OF ONE LYMPH NODES NEGATIVE FOR CARCINOMA (0/1). - SEE ONCOLOGY TABLE. 3. Lymph node, sentinel, biopsy, Right axillary - ONE LYMPH NODE WITH ISOLATED TUMOR CELLS (0/1). 4. Lymph node, sentinel, biopsy, Right - METASTATIC CARCINOMA IN ONE OF ONE LYMPH NODES (1/1). 5. Lymph node, sentinel, biopsy, Right - ONE OF ONE LYMPH NODES NEGATIVE FOR CARCINOMA (0/1). 6. Lymph node, sentinel, biopsy, Right - METASTATIC CARCINOMA IN ONE OF ONE LYMPH NODES (1/1). - EXTRACAPSULAR EXTENSION. 7. Lymph node, sentinel, biopsy, Right - METASTATIC CARCINOMA IN ONE OF ONE LYMPH NODES (1/1). - EXTRACAPSULAR EXTENSION. 8. Lymph node, sentinel, biopsy, Right - ONE OF ONE LYMPH NODES NEGATIVE FOR CARCINOMA (0/1). 9. Lymph node, sentinel, biopsy, Right - ONE OF ONE LYMPH NODES NEGATIVE FOR CARCINOMA (0/1). 10. Lymph node, sentinel, biopsy, Right - METASTATIC CARCINOMA IN ONE OF ONE LYMPH NODES (1/1). - EXTRACAPSULAR EXTENSION. 11. Lymph node, biopsy, Right axillary - ONE OF ONE LYMPH NODES NEGATIVE FOR CARCINOMA (0/1).     07/22/2017 Receptors  her2   Ductal carcinoma in primary breast tumor and node metastasis are ER 95%, PR 95% strongly positive, and HER2 + The lobular component in primary breast tumor is ER 95% positive, PR 95% positive, strong staining, HER-2 negative.   Original biopsy [id number]: Ductal morphology, Her2 FISH positive. Current specimen: Main focus is 75% ductal morphology, 25% lobular morphology. Smaller focus is lobular morphology. #2A Lobular morphology, Her IHC negative. #2E Lobular morphology, Her2 FISH negative, Her2 IHC negative. #4 50% lobular and 50% ductal morphology, Her2 FISH positive, Her2 IHC negative. #6 Ductal morphology #7 Ductal morphology #10 50% lobular, 50% ductal morphology    08/25/2017 Imaging   MRI abdomen done at [facility]:  Enhancing structure measing 5 mm is compatible with a flash filling hemangioma. No specific findings to suggest metastatic cancer    08/25/2017 Imaging   MRI Abdomen at [facility] 08/25/17  IMPRESSION: 1. Lesion in segment 7 of the liver has signal and enhancement characteristics compatible with a flash filling hemangioma. No specific findings identified to suggest metastatic disease.   08/29/2017 - 12/12/2017 Chemotherapy   adjuvant TCHP every 3 weeks for 6 cycles starting 08/29/17. Postponed and decreased Carbo to 632m and added Onpro with cycle 2 on 09/19/17 due to neutropenic fever. Carbplatin further reduced to 5045mstarting with cycle 3 due to thrombocytopenia. Herceptin was held for Cycle 4 and 5 due to worsening global longitudinal strain found on ECHO from 11/07/17. Completed 6 cycles on 12/12/17       09/04/2017 - 09/09/2017 Hospital Admission   Admit date: 09/04/2017 Discharge date: 09/09/2017   DISCHARGE DIAGNOSES:  Principal  Problem:   Sepsis ([place]) Active Problems:   Malignant neoplasm of upper-outer quadrant of right breast in female, estrogen receptor positive ([place])   Port-A-Cath in place   Neutropenic fever ([place])   Hyponatremia    Malignant neoplasm of breast in female, estrogen receptor positive ([place])      12/28/2017 Imaging   CT CHEST IMPRESSION: 1. 2.3 x 2.7 x 1.2 cm irregular masslike opacity within the right middle lobe worrisome for malignancy/metastasis. 2. 3 new very small sclerotic lesions, within the sternum and the body of T4, worrisome for new metastases.    12/28/2017 - 01/05/2018 Hospital Admission   Admit date: 12/28/17-01/05/18 Admission diagnosis: multifocal pneumonia, influenza A, sepsis, hypoxia Additional comments: imaging during hospitalization notable for new irregular masslike opacity in the right middle lobe worrisome for metastasis and new very small sclerotic lesion within the sternum and the body of T4, worrisome for metastasis    12/29/2017 Imaging   CT ABD/PELVIS IMPRESSION: No definite acute intra-abdominal or intrapelvic abnormalities.   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    12/30/2017 Imaging   CT HEAD IMPRESSION: No acute intracranial abnormality or enhancing lesion.    01/16/2018 - 01/22/2019 Chemotherapy   Maintence Herceptin and Perjeta every 3 weeks for 6-12 months starting 01/16/18. Held 03/2018-07/17/18 due to low EF. Completed on 01/22/19    02/04/2018 PET scan   IMPRESSION: 1. The previous consolidation in the right middle lobe currently has a more nodular and reticular appearance with some associated mild atelectasis along its anterior margin. This demonstrates low-grade metabolic activity with maximum SUV 3.2 (background blood pool activity is 2.7). Although conceivably related to low-grade residuum from the patient's malignancy, the presence of calcified right hilar lymph nodes and small calcifications along the nodularity raise suspicion for a low-grade active granulomatous process or atypical infectious process. Surveillance is recommended. 2. Scattered deposits of hypermetabolic but benign metabolically active brown fat.     02/23/2018 - 04/09/2018 Radiation Therapy   Radiation treatment dates:   02/23/2018 - 04/09/2018   Site/dose:   The patient initially received a dose of 50.4 Gy in 28 fractions to the right chest wall and supraclavicular region. This was delivered using a 3-D conformal, 4 field technique. The patient then received a boost to the mastectomy scar. This delivered an additional 10 Gy in 5 fractions using an en face electron field. The total dose was 60.4 Gy.   Narrative: The patient tolerated radiation treatment relatively well.   The patient had some expected skin irritation as she progressed during treatment. Moist desquamation was not present at the end of treatment.    03/20/2018 -  Anti-estrogen oral therapy   -Monthly Zoladex injection starting 03/20/18  -Letrozole 2.77m once dialy starting 05/03/18  -Switched to Anastrozole starting 07/2018 Due to her worsening back pain. -Changed to Exemestane in 04/2019 due to back pain exacerbation.    04/06/2018 Imaging   CT Chest wo contrast IMPRESSION: 1. Stable linear nodular thickening in the RIGHT middle lobe is favored post infectious or inflammatory scarring. 2. Calcified RIGHT hilar lymph node is consistent post infectious process. 3. Stable small LEFT upper lobe nodule. 4. Stable sclerotic lesion in the manubrium. Sclerotic lesion at T3 is more prominent (4 mm). Sclerotic lesion at L 1 is new from 07/02/2017.    04/06/2018 Imaging   04/06/2018 CT Chest IMPRESSION: 1. Stable linear nodular thickening in the RIGHT middle lobe is favored post infectious or inflammatory scarring. 2. Calcified RIGHT  hilar lymph node is consistent post infectious process. 3. Stable small LEFT upper lobe nodule. 4. Stable sclerotic lesion in the manubrium. Sclerotic lesion at T3 is more prominent (4 mm). Sclerotic lesion at L 1 is new from 07/02/2017.    04/10/2018 Echocardiogram   LV EF 45%-50%    05/25/2018 Echocardiogram   LV EF 45%-50%    07/13/2018  Echocardiogram   Normal LV size with EF 55-60%   07/31/2018 Imaging   07/31/2018 Bone Scan IMPRESSION: No evidence of skeletal metastatic disease.    07/31/2018 Imaging   07/31/2018 CT CAP IMPRESSION: Residual linear and nodular peribronchovascular opacities in the right middle lobe, with other scattered areas of ground-glass opacities in the right middle, right upper and right lower lobes, which are located within the previously demonstrated bulky airspace consolidation. The findings are improved from the chest CT dated 12/30/2017, and relatively stable from PET-CT dated 02/04/2018. These may represent post infectious/post inflammatory changes or residual malignancy.   Stable partially calcified right hilar lymphadenopathy.   Interval development of nodular soft tissue thickening within the anterior mediastinum in the area of the thymus, new from chest CT dated 12/30/2017. This may represent reactive thymic tissue, thymoma or lymphadenopathy.   1.8 cm probably physiologic left ovarian cyst. Borderline thickening of the endometrium measuring 15.6 mm. Further evaluation with pelvic ultrasound may be considered.   No new or suspicious findings within the abdomen.    09/15/2018 Echocardiogram   EF 55-60%    01/28/2019 Imaging   MRI Lumbar spie 01/28/19  IMPRESSION: 1. Subcentimeter lesions in the L1 and L2 vertebral bodies, indeterminate for metastases. 2. Mild lower lumbar disc degeneration without stenosis.    01/28/2019 Imaging   CT chest 01/28/19  IMPRESSION: Right lung post radiation changes. No active lung disease or other acute findings.   No evidence of recurrent or metastatic carcinoma within the thorax.    03/15/2019 - 02/2020 Chemotherapy   Anti-HER2 Neratinib 240 mg daily, starting 03/15/19. She completed 1 year of treatment in 02/2020      CURRENT THERAPY:  -Monthly Zoladex injections starting 03/20/18 -Letrozole 2.45m once daily starting 05/03/18, changed  to anastrozole in 07/2018. Changed to Exemestane in 04/2019 due to back pain exacerbation.   INTERVAL HISTORY:  AAkela [name] here for a follow up of breast cancer. She was last seen by me on 01/25/21. She presents to the clinic alone. Since her last visit, she changed jobs. She notes her new job is very busy and tiring. This is delaying her plan for BSO. She continues to have hot flashes, but they are tolerable. She denies vaginal dryness. She was given stool cards at her last visit. She notes she forgot about them and has not completed that.  All other systems were reviewed with the patient and are negative.  MEDICAL HISTORY:  Past Medical History:  Diagnosis Date   Anxiety    [place]    [place] breast    Cardiomyopathy ([place]    Family history of breast cancer    Family history of colon cancer    GERD (gastroesophageal reflux disease)    Headache    couple of migraines in the past   History of kidney stones    Vitamin D deficiency     SURGICAL HISTORY: Past Surgical History:  Procedure Laterality Date   egg donation     kidney stone removal     MASS EXCISION Right 08/19/2019   Procedure: REMOVE EXCESS TISSUE FROM RIGHT LATERAL  MASTECTOMY SCAR;  Surgeon: [name], MD;  Location: [facility];  Service: General;  Laterality: Right;   MASTECTOMY W/ SENTINEL NODE BIOPSY Bilateral 07/22/2017   Procedure: RIGHT TOTAL MASTECTOMY WITH RIGHT AXILLARY SENTINEL LYMPH NODE BIOPSY, LEFT PROPHYLACTIC MASTECTOMY;  Surgeon: [name], MD;  Location: [place];  Service: General;  Laterality: Bilateral;   PORT-A-CATH REMOVAL Right 08/19/2019   Procedure: REMOVAL PORT-A-CATH;  Surgeon: [name], MD;  Location: [place];  Service: General;  Laterality: Right;   PORTACATH PLACEMENT Right 07/22/2017   Procedure: INSERTION PORT-A-CATH WITH [name];  Surgeon: [name], MD;  Location: [place];  Service: General;  Laterality: Right;    I  have reviewed the social history and family history with the patient and they are unchanged from previous note.  ALLERGIES:  is allergic to eszopiclone.  MEDICATIONS:  Current Outpatient Medications  Medication Sig Dispense Refill   B Complex-C (B-COMPLEX WITH VITAMIN C) tablet Take 1 tablet by mouth daily.     calcium-vitamin D (OSCAL WITH D) 250-125 MG-UNIT tablet Take 1 tablet by mouth daily.     Cholecalciferol (VITAMIN D) 2000 units tablet Take 2,000 Units by mouth daily.     exemestane (AROMASIN) 25 MG tablet TAKE 1 TABLET BY MOUTH EVERY DAY AFTER BREAKFAST 90 tablet 3   fluticasone (FLONASE) 50 MCG/ACT nasal spray Place into both nostrils daily.     levothyroxine (SYNTHROID, LEVOTHROID) 25 MCG tablet Take 25 mcg by mouth daily before breakfast.     venlafaxine XR (EFFEXOR-XR) 75 MG 24 hr capsule Take 3 capsules (225 mg total) by mouth daily with breakfast. 180 capsule 1   Current Facility-Administered Medications  Medication Dose Route Frequency Provider Last Rate Last Admin   gi cocktail (Maalox,Lidocaine,Donnatal)  30 mL Oral Once Harle Stanford., PA-C        PHYSICAL EXAMINATION: ECOG PERFORMANCE STATUS: 0 - Asymptomatic  Vitals:   05/18/21 1507  BP: 117/78  Pulse: (!) 110  Resp: 18  Temp: 98 F (36.7 C)  SpO2: 98%   Filed Weights   05/18/21 1507  Weight: 185 lb 8 oz (84.1 kg)    GENERAL:alert, no distress and comfortable SKIN: skin color, texture, turgor are normal, no rashes or significant lesions EYES: normal, Conjunctiva are pink and non-injected, sclera clear  NECK: supple, thyroid normal size, non-tender, without nodularity LYMPH:  no palpable lymphadenopathy in the cervical, axillary  LUNGS: clear to auscultation and percussion with normal breathing effort HEART: regular rate & rhythm and no murmurs and no lower extremity edema ABDOMEN:abdomen soft, non-tender and normal bowel sounds Musculoskeletal:no cyanosis of digits and no clubbing  NEURO: alert &  oriented x 3 with fluent speech, no focal motor/sensory deficits BREAST: No palpable mass, nodules or adenopathy bilaterally. Breast exam benign.   LABORATORY DATA:  I have reviewed the data as listed CBC Latest Ref Rng & Units 05/18/2021 01/25/2021 09/07/2020  WBC 4.0 - 10.5 K/uL 5.2 5.7 6.2  Hemoglobin 12.0 - 15.0 g/dL 13.0 12.6 13.3  Hematocrit 36.0 - 46.0 % 39.3 38.3 40.1  Platelets 150 - 400 K/uL 233 211 233     CMP Latest Ref Rng & Units 05/18/2021 01/25/2021 09/07/2020  Glucose 70 - 99 mg/dL 108(H) 116(H) 113(H)  BUN 6 - 20 mg/dL _0 Creatinine 0.44 - 1.00 mg/dL 1.07(H) 1.02(H) 1.12(H)  Sodium 135 - 145 mmol/L 143 139 142  Potassium 3.5 - 5.1 mmol/L 3.8 3.8 4.2  Chloride 98 - 111 mmol/L  106 106 104  CO2 22 - 32 mmol/L _0 Calcium 8.9 - 10.3 mg/dL 9.5 9.3 9.8  Total Protein 6.5 - 8.1 g/dL 7.6 7.4 7.6  Total Bilirubin 0.3 - 1.2 mg/dL 0.4 0.3 0.3  Alkaline Phos 38 - 126 U/L 119 118 115  AST 15 - 41 U/L 15 14(L) 15  ALT 0 - 44 U/L _1 RADIOGRAPHIC STUDIES: I have personally reviewed the radiological images as listed and agreed with the findings in the report. No results found.   ASSESSMENT & PLAN:  Sheila [name] is a 45 y.o. female with   1. Malignant neoplasm of upper-outer quadrant of right breast in female, mixed invasive ductal and lobular carcinoma, pT3(m)N2aM0, stage 1b, ductal carcinoma triple positive, lobular carcinoma ER+/PR+/HER2-,  Grade 2 -She was diagnosed in 05/2017. She is s/p b/l mastectomy, adjuvant TCHP and radiation and maintenance Herceptin/Perjeta and 1 year of Neratinib.  -Her genetic testing was negative.  -She started adjuvant letrozole with monthly Zoladex injections in 04/2018.  Letrozole was subsequently changed to anastrozole, then exemestane due to some joint and back pain. She tolerates exemestane well with mild hot flashes, tolerable.  -She is clinically doing very well, with tolerable hot flashes, overall tolerating  exemestane well.  Lab reviewed, exam was unremarkable, no clinical concern for recurrence. -She is still planning to have BSO, but her job is keeping her very busy. We will continue Zoladex injection every 4 weeks    2. H/o vaginal bleeding   -Started 04/12/19 with cramping, light to moderate flow, fist time since she started Zoladex injections on 03/20/18. Has not recurred.  -I checked her estradiol level was less than 5, in postmenopausal range. -She does not have an OBGYN. I can refer her to a doctor here if she has questions or when she is ready to proceed with BSO    3. Hot flash  -Manageable. Continue Effexor 270m daily.  -Will monitor on exemestane    4. Bone Health  -Continue Vitamin D supplement and Vitamin C rich food and do weight bearing exercise.  -Her 08/2020 baseline DEXA was normal with lowest T-score -0.6 at left hip.     5. Back pain, knee pain  -Her CT Chest on 04/06/18 showed sclerotic lesions of spine in T3 and L1, but her bone scan in 07/2018 was negative -MRI lumbar from 01/28/19 shows lesions of L1 and L2 indeterminate, will continue follow up   -She takes tylenol for pain. Manageable on AI  6. Health maintenance -she has FOBT cards. She will complete these soon to determine if she needs colonoscopy     PLAN:  -Proceed with Zoladex today and every 4 weeks -Continue Exemestane  -Lab and f/u in 4 months -complete FOBT card, can return at next visit    No problem-specific Assessment & Plan notes found for this encounter.   No orders of the defined types were placed in this encounter.  All questions were answered. The patient knows to call the clinic with any problems, questions or concerns. No barriers to learning was detected.      YTruitt Merle MD 05/18/2021   I, KWilburn Mylar am acting as scribe for YTruitt Merle MD.   I have reviewed the above documentation for accuracy and completeness, and I agree with the above.

## 2021-05-18 NOTE — Patient Instructions (Signed)
Goserelin injection What is this medication? GOSERELIN (GOE se rel in) is similar to a hormone found in the body. It lowers the amount of sex hormones that the body makes. Men will have lower testosterone levels and women will have lower estrogen levels while taking this medicine. In men, this medicine is used to treat prostate cancer; the injection is either given once per month or once every 12 weeks. A once per month injection (only) is used to treat women with endometriosis, dysfunctional uterine bleeding, or advanced breast cancer. This medicine may be used for other purposes; ask your health care provider or pharmacist if you have questions. COMMON BRAND NAME(S): Zoladex What should I tell my care team before I take this medication? They need to know if you have any of these conditions: bone problems diabetes heart disease history of irregular heartbeat an unusual or allergic reaction to goserelin, other medicines, foods, dyes, or preservatives pregnant or trying to get pregnant breast-feeding How should I use this medication? This medicine is for injection under the skin. It is given by a health care professional in a hospital or clinic setting. Talk to your pediatrician regarding the use of this medicine in children. Special care may be needed. Overdosage: If you think you have taken too much of this medicine contact a poison control center or emergency room at once. NOTE: This medicine is only for you. Do not share this medicine with others. What if I miss a dose? It is important not to miss your dose. Call your doctor or health care professional if you are unable to keep an appointment. What may interact with this medication? Do not take this medicine with any of the following medications: cisapride dronedarone pimozide thioridazine This medicine may also interact with the following medications: other medicines that prolong the QT interval (an abnormal heart rhythm) This list  may not describe all possible interactions. Give your health care provider a list of all the medicines, herbs, non-prescription drugs, or dietary supplements you use. Also tell them if you smoke, drink alcohol, or use illegal drugs. Some items may interact with your medicine. What should I watch for while using this medication? Visit your doctor or health care provider for regular checks on your progress. Your symptoms may appear to get worse during the first weeks of this therapy. Tell your doctor or healthcare provider if your symptoms do not start to get better or if they get worse after this time. Your bones may get weaker if you take this medicine for a long time. If you smoke or frequently drink alcohol you may increase your risk of bone loss. A family history of osteoporosis, chronic use of drugs for seizures (convulsions), or corticosteroids can also increase your risk of bone loss. Talk to your doctor about how to keep your bones strong. This medicine should stop regular monthly menstruation in women. Tell your doctor if you continue to menstruate. Women should not become pregnant while taking this medicine or for 12 weeks after stopping this medicine. Women should inform their doctor if they wish to become pregnant or think they might be pregnant. There is a potential for serious side effects to an unborn child. Talk to your health care professional or pharmacist for more information. Do not breast-feed an infant while taking this medicine. Men should inform their doctors if they wish to father a child. This medicine may lower sperm counts. Talk to your health care professional or pharmacist for more information. This medicine may   increase blood sugar. Ask your healthcare provider if changes in diet or medicines are needed if you have diabetes. What side effects may I notice from receiving this medication? Side effects that you should report to your doctor or health care professional as soon as  possible: allergic reactions like skin rash, itching or hives, swelling of the face, lips, or tongue bone pain breathing problems changes in vision chest pain feeling faint or lightheaded, falls fever, chills pain, swelling, warmth in the leg pain, tingling, numbness in the hands or feet signs and symptoms of high blood sugar such as being more thirsty or hungry or having to urinate more than normal. You may also feel very tired or have blurry vision signs and symptoms of low blood pressure like dizziness; feeling faint or lightheaded, falls; unusually weak or tired stomach pain swelling of the ankles, feet, hands trouble passing urine or change in the amount of urine unusually high or low blood pressure unusually weak or tired Side effects that usually do not require medical attention (report to your doctor or health care professional if they continue or are bothersome): change in sex drive or performance changes in breast size in both males and females changes in emotions or moods headache hot flashes irritation at site where injected loss of appetite skin problems like acne, dry skin vaginal dryness This list may not describe all possible side effects. Call your doctor for medical advice about side effects. You may report side effects to FDA at 1-800-FDA-1088. Where should I keep my medication? This drug is given in a hospital or clinic and will not be stored at home. NOTE: This sheet is a summary. It may not cover all possible information. If you have questions about this medicine, talk to your doctor, pharmacist, or health care provider.  2022 [employer] (2019-02-22 14:05:56)  

## 2021-05-19 LAB — CANCER ANTIGEN 27.29: CA 27.29: 5.8 U/mL (ref 0.0–38.6)

## 2021-05-22 ENCOUNTER — Telehealth: Payer: Self-pay | Admitting: Hematology

## 2021-05-22 NOTE — Telephone Encounter (Signed)
Left message with follow-up appointments per 7/1 los. 

## 2021-05-30 ENCOUNTER — Other Ambulatory Visit: Payer: Self-pay | Admitting: Hematology

## 2021-05-31 DIAGNOSIS — E559 Vitamin D deficiency, unspecified: Secondary | ICD-10-CM | POA: Diagnosis not present

## 2021-05-31 DIAGNOSIS — E039 Hypothyroidism, unspecified: Secondary | ICD-10-CM | POA: Diagnosis not present

## 2021-05-31 DIAGNOSIS — Z0000 Encounter for general adult medical examination without abnormal findings: Secondary | ICD-10-CM | POA: Diagnosis not present

## 2021-05-31 DIAGNOSIS — R739 Hyperglycemia, unspecified: Secondary | ICD-10-CM | POA: Diagnosis not present

## 2021-06-14 ENCOUNTER — Other Ambulatory Visit: Payer: Self-pay

## 2021-06-14 ENCOUNTER — Inpatient Hospital Stay: Payer: BC Managed Care – PPO

## 2021-06-14 VITALS — BP 105/70 | HR 99 | Temp 98.2°F | Resp 20

## 2021-06-14 DIAGNOSIS — Z780 Asymptomatic menopausal state: Secondary | ICD-10-CM | POA: Diagnosis not present

## 2021-06-14 DIAGNOSIS — M8580 Other specified disorders of bone density and structure, unspecified site: Secondary | ICD-10-CM | POA: Diagnosis not present

## 2021-06-14 DIAGNOSIS — Z79811 Long term (current) use of aromatase inhibitors: Secondary | ICD-10-CM | POA: Diagnosis not present

## 2021-06-14 DIAGNOSIS — C50411 Malignant neoplasm of upper-outer quadrant of right female breast: Secondary | ICD-10-CM | POA: Diagnosis not present

## 2021-06-14 DIAGNOSIS — N939 Abnormal uterine and vaginal bleeding, unspecified: Secondary | ICD-10-CM | POA: Diagnosis not present

## 2021-06-14 DIAGNOSIS — M25569 Pain in unspecified knee: Secondary | ICD-10-CM | POA: Diagnosis not present

## 2021-06-14 DIAGNOSIS — M549 Dorsalgia, unspecified: Secondary | ICD-10-CM | POA: Diagnosis not present

## 2021-06-14 DIAGNOSIS — Z170 Estrogen receptor positive status [ER+]: Secondary | ICD-10-CM | POA: Diagnosis not present

## 2021-06-14 DIAGNOSIS — Z9013 Acquired absence of bilateral breasts and nipples: Secondary | ICD-10-CM | POA: Diagnosis not present

## 2021-06-14 DIAGNOSIS — Z95828 Presence of other vascular implants and grafts: Secondary | ICD-10-CM

## 2021-06-14 DIAGNOSIS — N951 Menopausal and female climacteric states: Secondary | ICD-10-CM | POA: Diagnosis not present

## 2021-06-14 MED ORDER — GOSERELIN ACETATE 3.6 MG ~~LOC~~ IMPL
3.6000 mg | DRUG_IMPLANT | Freq: Once | SUBCUTANEOUS | Status: AC
  Administered 2021-06-14: 3.6 mg via SUBCUTANEOUS

## 2021-06-14 MED ORDER — GOSERELIN ACETATE 3.6 MG ~~LOC~~ IMPL
DRUG_IMPLANT | SUBCUTANEOUS | Status: AC
  Filled 2021-06-14: qty 3.6

## 2021-07-12 ENCOUNTER — Inpatient Hospital Stay: Payer: BC Managed Care – PPO | Attending: Nurse Practitioner

## 2021-07-12 ENCOUNTER — Other Ambulatory Visit: Payer: Self-pay

## 2021-07-12 VITALS — BP 105/70 | HR 107 | Temp 98.5°F | Resp 18

## 2021-07-12 DIAGNOSIS — Z170 Estrogen receptor positive status [ER+]: Secondary | ICD-10-CM | POA: Diagnosis not present

## 2021-07-12 DIAGNOSIS — Z9013 Acquired absence of bilateral breasts and nipples: Secondary | ICD-10-CM | POA: Insufficient documentation

## 2021-07-12 DIAGNOSIS — C50411 Malignant neoplasm of upper-outer quadrant of right female breast: Secondary | ICD-10-CM | POA: Diagnosis not present

## 2021-07-12 DIAGNOSIS — Z79811 Long term (current) use of aromatase inhibitors: Secondary | ICD-10-CM | POA: Insufficient documentation

## 2021-07-12 DIAGNOSIS — Z95828 Presence of other vascular implants and grafts: Secondary | ICD-10-CM

## 2021-07-12 MED ORDER — GOSERELIN ACETATE 3.6 MG ~~LOC~~ IMPL
3.6000 mg | DRUG_IMPLANT | Freq: Once | SUBCUTANEOUS | Status: AC
  Administered 2021-07-12: 3.6 mg via SUBCUTANEOUS
  Filled 2021-07-12: qty 3.6

## 2021-08-09 ENCOUNTER — Other Ambulatory Visit: Payer: Self-pay

## 2021-08-09 ENCOUNTER — Inpatient Hospital Stay: Payer: BC Managed Care – PPO | Attending: Nurse Practitioner

## 2021-08-09 VITALS — BP 112/61 | HR 101 | Temp 99.1°F | Resp 20

## 2021-08-09 DIAGNOSIS — C50411 Malignant neoplasm of upper-outer quadrant of right female breast: Secondary | ICD-10-CM | POA: Diagnosis not present

## 2021-08-09 DIAGNOSIS — Z170 Estrogen receptor positive status [ER+]: Secondary | ICD-10-CM | POA: Diagnosis not present

## 2021-08-09 DIAGNOSIS — Z95828 Presence of other vascular implants and grafts: Secondary | ICD-10-CM

## 2021-08-09 MED ORDER — GOSERELIN ACETATE 3.6 MG ~~LOC~~ IMPL
3.6000 mg | DRUG_IMPLANT | Freq: Once | SUBCUTANEOUS | Status: AC
  Administered 2021-08-09: 3.6 mg via SUBCUTANEOUS
  Filled 2021-08-09: qty 3.6

## 2021-08-22 ENCOUNTER — Other Ambulatory Visit: Payer: Self-pay | Admitting: Hematology

## 2021-09-06 ENCOUNTER — Inpatient Hospital Stay: Payer: BC Managed Care – PPO | Attending: Nurse Practitioner

## 2021-09-06 ENCOUNTER — Encounter: Payer: Self-pay | Admitting: Hematology

## 2021-09-06 ENCOUNTER — Inpatient Hospital Stay (HOSPITAL_BASED_OUTPATIENT_CLINIC_OR_DEPARTMENT_OTHER): Payer: BC Managed Care – PPO | Admitting: Hematology

## 2021-09-06 ENCOUNTER — Inpatient Hospital Stay: Payer: BC Managed Care – PPO

## 2021-09-06 ENCOUNTER — Other Ambulatory Visit: Payer: Self-pay

## 2021-09-06 VITALS — BP 110/69 | HR 97 | Temp 98.5°F | Resp 19 | Ht 66.0 in | Wt 182.0 lb

## 2021-09-06 DIAGNOSIS — Z170 Estrogen receptor positive status [ER+]: Secondary | ICD-10-CM | POA: Insufficient documentation

## 2021-09-06 DIAGNOSIS — C50411 Malignant neoplasm of upper-outer quadrant of right female breast: Secondary | ICD-10-CM

## 2021-09-06 DIAGNOSIS — Z95828 Presence of other vascular implants and grafts: Secondary | ICD-10-CM

## 2021-09-06 LAB — CBC WITH DIFFERENTIAL/PLATELET
Abs Immature Granulocytes: 0.01 10*3/uL (ref 0.00–0.07)
Basophils Absolute: 0.1 10*3/uL (ref 0.0–0.1)
Basophils Relative: 1 %
Eosinophils Absolute: 0.1 10*3/uL (ref 0.0–0.5)
Eosinophils Relative: 3 %
HCT: 38.1 % (ref 36.0–46.0)
Hemoglobin: 12.6 g/dL (ref 12.0–15.0)
Immature Granulocytes: 0 %
Lymphocytes Relative: 31 %
Lymphs Abs: 1.7 10*3/uL (ref 0.7–4.0)
MCH: 28.9 pg (ref 26.0–34.0)
MCHC: 33.1 g/dL (ref 30.0–36.0)
MCV: 87.4 fL (ref 80.0–100.0)
Monocytes Absolute: 0.5 10*3/uL (ref 0.1–1.0)
Monocytes Relative: 8 %
Neutro Abs: 3.2 10*3/uL (ref 1.7–7.7)
Neutrophils Relative %: 57 %
Platelets: 222 10*3/uL (ref 150–400)
RBC: 4.36 MIL/uL (ref 3.87–5.11)
RDW: 13.4 % (ref 11.5–15.5)
WBC: 5.6 10*3/uL (ref 4.0–10.5)
nRBC: 0 % (ref 0.0–0.2)

## 2021-09-06 LAB — CMP (CANCER CENTER ONLY)
ALT: 14 U/L (ref 0–44)
AST: 18 U/L (ref 15–41)
Albumin: 4.6 g/dL (ref 3.5–5.0)
Alkaline Phosphatase: 100 U/L (ref 38–126)
Anion gap: 8 (ref 5–15)
BUN: 16 mg/dL (ref 6–20)
CO2: 28 mmol/L (ref 22–32)
Calcium: 9.6 mg/dL (ref 8.9–10.3)
Chloride: 104 mmol/L (ref 98–111)
Creatinine: 0.97 mg/dL (ref 0.44–1.00)
GFR, Estimated: >60 mL/min (ref >60)
Glucose, Bld: 94 mg/dL (ref 70–99)
Potassium: 4.3 mmol/L (ref 3.5–5.1)
Sodium: 140 mmol/L (ref 135–145)
Total Bilirubin: 0.3 mg/dL (ref 0.3–1.2)
Total Protein: 8.1 g/dL (ref 6.5–8.1)

## 2021-09-06 MED ORDER — GOSERELIN ACETATE 3.6 MG ~~LOC~~ IMPL
3.6000 mg | DRUG_IMPLANT | Freq: Once | SUBCUTANEOUS | Status: AC
Start: 2021-09-06 — End: 2021-09-06
  Administered 2021-09-06: 3.6 mg via SUBCUTANEOUS
  Filled 2021-09-06: qty 3.6

## 2021-09-06 NOTE — Progress Notes (Signed)
[place]   [phone] [phone]   Clinic Follow up Note   Patient Care Team: Kristen Loader, FNP as PCP - General (Family Medicine) [name] (Family Medicine) [name], MD as Consulting Physician (General Surgery) [name], MD as Consulting Physician (Hematology) Kyung Rudd, MD as Consulting Physician (Radiation Oncology)  Date of Service:  09/06/2021  CHIEF COMPLAINT: f/u of right breast cancer  CURRENT THERAPY:  -Monthly Zoladex injections starting 03/20/18 -Letrozole 2.71m once daily starting 05/03/18, changed to anastrozole in 07/2018. Changed to Exemestane in 04/2019 due to back pain exacerbation.   ASSESSMENT & PLAN:  ALynzie [name] a 45y.o. female with   1. Malignant neoplasm of upper-outer quadrant of right breast in female, mixed invasive ductal and lobular carcinoma, pT3(m)N2aM0, stage 1b, ductal carcinoma triple positive, lobular carcinoma ER+/PR+/HER2-,  Grade 2 -She was diagnosed in 05/2017. She is s/p b/l mastectomy, adjuvant TCHP and radiation and maintenance Herceptin/Perjeta and 1 year of Neratinib.  -Her genetic testing was negative.  -She started adjuvant letrozole with monthly Zoladex injections in 04/2018.  Letrozole was subsequently changed to anastrozole, then exemestane due to some joint and back pain. She tolerates exemestane well with mild hot flashes, tolerable.  -She is clinically doing very well, with tolerable hot flashes, overall tolerating exemestane well.  Lab reviewed, exam was unremarkable, no clinical concern for recurrence. -She is still considering BSO, which will be after she finishes school in 10/2021. We will continue Zoladex injection every 4 weeks until BSO  -lab in 3 months, f/u in 6 months   2. H/o vaginal bleeding   -Started 04/12/19 with cramping, light to moderate flow, fist time since she started Zoladex injections on 03/20/18. Has not recurred.  -I checked her estradiol level  03/2019, was less than 5, in postmenopausal range. -She does not have an OBGYN. I can refer her to a doctor here if she has questions or when she is ready to proceed with BSO    3. Hot flash  -Manageable. Continue Effexor 2239mdaily.  -Will monitor on exemestane    4. Bone Health  -Continue Vitamin D supplement and Vitamin C rich food and do weight bearing exercise.  -Her 08/2020 baseline DEXA was normal with lowest T-score -0.6 at left hip.     5. Back pain, knee pain  -Her CT Chest on 04/06/18 showed sclerotic lesions of spine in T3 and L1, but her bone scan in 07/2018 was negative -MRI lumbar from 01/28/19 shows lesions of L1 and L2 indeterminate, will continue follow up   -She takes tylenol for pain. Manageable on AI   6. Health maintenance -she has FOBT cards. She will complete these soon to determine if she needs colonoscopy     PLAN:  -Proceed with Zoladex today and every 4 weeks -Continue Exemestane  -Lab in 3 months -lab and f/u in 6 months   No problem-specific Assessment & Plan notes found for this encounter.   SUMMARY OF ONCOLOGIC HISTORY: Oncology History Overview Note   Cancer Staging Malignant neoplasm of upper-outer quadrant of right breast in female, estrogen receptor positive (HCAllisoniaStaging form: Breast, AJCC 8th Edition - Clinical stage from 06/11/2017: Stage IB (cT3, cN0, cM0, G2, ER: Positive, PR: Positive, HER2: Positive) - Signed by FeTruitt MerleMD on 06/16/2017 - Pathologic stage from 07/22/2017: Stage IB (pT3(m), pN2a(sn), cM0, G2, ER: Positive, PR: Positive, HER2: Positive) - Signed by BuAlla FeelingNP on 08/04/2017    Malignant  neoplasm of upper-outer quadrant of right breast in female, estrogen receptor positive ([place])  06/11/2017 Initial Biopsy   Diagnosis 06/11/17 Breast, right, needle core biopsy, 11:00 o'clock - INVASIVE DUCTAL CARCINOMA, G2   06/11/2017 Mammogram   [name] and [facility] Diagnostic Breast Tomo Bilateral 06/11/17 IMPRESSION: 1. Highly suspicious  mass within the retroareolar right breast, extending from the 8:00 to 11:00 axes, anterior to posterior depth, extending anteriorly to the nipple with associated nipple retraction, measuring at least 5.5 cm by ultrasound   2. No sonographic evidence of metastatic lymphadenopathy in the right axilla.  3. No evidence of malignancy within the left breast.    06/11/2017 Initial Diagnosis   Malignant neoplasm of upper-outer quadrant of right breast in female, estrogen receptor positive ([place])   06/11/2017 Receptors her2   Estrogen Receptor: 100%, POSITIVE, STRONG STAINING INTENSITY Progesterone Receptor: 100%, POSITIVE, STRONG STAINING INTENSITY Proliferation Marker Ki67: 12%Proliferation Marker Ki67: 12%  HER2 - **POSITIVE** RATIO OF HER2/CEP17 SIGNALS 2.55 AVERAGE HER2 COPY NUMBER PER CELL 4.85   06/25/2017 Genetic Testing   Negative genetic testing on the 9 gene STAT panel.  The STAT Breast cancer panel offered by [facility] includes sequencing and rearrangement analysis for the following 9 genes:  ATM, BRCA1, BRCA2, CDH1, CHEK2, PALB2, PTEN, STK11 and TP53.   The report date is June 25, 2017.  Negative genetic testing on the common hereditary cancer panel.  The Hereditary Gene Panel offered by [facility] includes sequencing and/or deletion duplication testing of the following 46 genes: APC, ATM, AXIN2, BARD1, BMPR1A, BRCA1, BRCA2, BRIP1, CDH1, CDKN2A (p14ARF), CDKN2A (p16INK4a), CHEK2, CTNNA1, DICER1, EPCAM (Deletion/duplication testing only), GREM1 (promoter region deletion/duplication testing only), KIT, MEN1, MLH1, MSH2, MSH3, MSH6, MUTYH, NBN, NF1, NHTL1, PALB2, PDGFRA, PMS2, POLD1, POLE, PTEN, RAD50, RAD51C, RAD51D, SDHB, SDHC, SDHD, SMAD4, SMARCA4. STK11, TP53, TSC1, TSC2, and VHL.  The following genes were evaluated for sequence changes only: SDHA and HOXB13 c.251G>A variant only.  The report date is June 25, 2017.    07/02/2017 Imaging   CT cAP 07/02/17 IMPRESSION: 1. Subareolar right breast  mass. No compelling findings of nodal or metastatic involvement. 2. Old granulomatous disease. 3. There is a 7 mm enhancing or hyperdense focus posteriorly in segment 7 of the liver in the subcapsular region. By virtue of its small size this lesion is technically nonspecific although statistically likely to be a small benign lesions such as flash filling hemangioma. This may warrant surveillance. 4.  Prominent stool throughout the colon favors constipation. 5. Degenerative disc disease and spondylosis at L5-S1 likely causing mild impingement.   07/02/2017 Imaging   Bone scan 07/02/17 IMPRESSION: Today' s exam is negative. I ascribed the tiny focus of activity just proximal to the left antecubital region to injection site.   07/22/2017 Surgery   RIGHT TOTAL MASTECTOMY WITH RIGHT AXILLARY SENTINEL LYMPH NODE BIOPSY, LEFT PROPHYLACTIC MASTECTOM and INSERTION PORT-A-CATH WITH [name] by [name] and [facility]    07/22/2017 Pathology Results   Diagnosis 1. Breast, simple mastectomy, Left - FIBROADENOMA. - NO MALIGNANCY IDENTIFIED. 2. 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 - ADDITIONAL FOCUS OF LOBULAR CARCINOMA, GRADE 2, SPANNING 2.2 CM. - INTERMEDIATE GRADE DUCTAL CARCINOMA IN SITU. - INVASIVE CARCINOMA COMES TO WITHIN 0.2 TO 0.3 CM OF THE DEEP MARGIN, FOCALLY. - TUMOR FOCALLY INVOLVES EPIDERMIS. - LYMPHOVASCULAR INVASION OF DERMAL LYMPHATICS. - ONE OF ONE LYMPH NODES NEGATIVE FOR CARCINOMA (0/1). - SEE ONCOLOGY TABLE. 3. Lymph node, sentinel, biopsy, Right  axillary - ONE LYMPH NODE WITH ISOLATED TUMOR CELLS (0/1). 4. Lymph node, sentinel, biopsy, Right - METASTATIC CARCINOMA IN ONE OF ONE LYMPH NODES (1/1). 5. Lymph node, sentinel, biopsy, Right - ONE OF ONE LYMPH NODES NEGATIVE FOR CARCINOMA (0/1). 6. Lymph node, sentinel, biopsy, Right - METASTATIC CARCINOMA IN ONE OF ONE LYMPH NODES (1/1). - EXTRACAPSULAR  EXTENSION. 7. Lymph node, sentinel, biopsy, Right - METASTATIC CARCINOMA IN ONE OF ONE LYMPH NODES (1/1). - EXTRACAPSULAR EXTENSION. 8. Lymph node, sentinel, biopsy, Right - ONE OF ONE LYMPH NODES NEGATIVE FOR CARCINOMA (0/1). 9. Lymph node, sentinel, biopsy, Right - ONE OF ONE LYMPH NODES NEGATIVE FOR CARCINOMA (0/1). 10. Lymph node, sentinel, biopsy, Right - METASTATIC CARCINOMA IN ONE OF ONE LYMPH NODES (1/1). - EXTRACAPSULAR EXTENSION. 11. Lymph node, biopsy, Right axillary - ONE OF ONE LYMPH NODES NEGATIVE FOR CARCINOMA (0/1).    07/22/2017 Receptors her2   Ductal carcinoma in primary breast tumor and node metastasis are ER 95%, PR 95% strongly positive, and HER2 + The lobular component in primary breast tumor is ER 95% positive, PR 95% positive, strong staining, HER-2 negative.   Original biopsy [id number]: Ductal morphology, Her2 FISH positive. Current specimen: Main focus is 75% ductal morphology, 25% lobular morphology. Smaller focus is lobular morphology. #2A Lobular morphology, Her IHC negative. #2E Lobular morphology, Her2 FISH negative, Her2 IHC negative. #4 50% lobular and 50% ductal morphology, Her2 FISH positive, Her2 IHC negative. #6 Ductal morphology #7 Ductal morphology #10 50% lobular, 50% ductal morphology   08/25/2017 Imaging   MRI abdomen done at [facility]:  Enhancing structure measing 5 mm is compatible with a flash filling hemangioma. No specific findings to suggest metastatic cancer   08/25/2017 Imaging   MRI Abdomen at [facility] 08/25/17  IMPRESSION: 1. Lesion in segment 7 of the liver has signal and enhancement characteristics compatible with a flash filling hemangioma. No specific findings identified to suggest metastatic disease.   08/29/2017 - 12/12/2017 Chemotherapy   adjuvant TCHP every 3 weeks for 6 cycles starting 08/29/17. Postponed and decreased Carbo to 638m and added Onpro with cycle 2 on 09/19/17 due to neutropenic fever. Carbplatin further  reduced to 5073mstarting with cycle 3 due to thrombocytopenia. Herceptin was held for Cycle 4 and 5 due to worsening global longitudinal strain found on ECHO from 11/07/17. Completed 6 cycles on 12/12/17      09/04/2017 - 09/09/2017 Hospital Admission   Admit date: 09/04/2017 Discharge date: 09/09/2017   DISCHARGE DIAGNOSES:  Principal Problem:   Sepsis ([facility] Problems:   Malignant neoplasm of upper-outer quadrant of right breast in female, estrogen receptor positive (HCD[place]  Port-A-Cath in place   Neutropenic fever ([facility]  Hyponatremia   Malignant neoplasm of breast in female, estrogen receptor positive ([facility]    12/28/2017 Imaging   CT CHEST IMPRESSION: 1. 2.3 x 2.7 x 1.2 cm irregular masslike opacity within the right middle lobe worrisome for malignancy/metastasis. 2. 3 new very small sclerotic lesions, within the sternum and the body of T4, worrisome for new metastases.   12/28/2017 - 01/05/2018 Hospital Admission   Admit date: 12/28/17-01/05/18 Admission diagnosis: multifocal pneumonia, influenza A, sepsis, hypoxia Additional comments: imaging during hospitalization notable for new irregular masslike opacity in the right middle lobe worrisome for metastasis and new very small sclerotic lesion within the sternum and the body of T4, worrisome for metastasis   12/29/2017 Imaging   CT ABD/PELVIS IMPRESSION: No definite acute intra-abdominal or intrapelvic abnormalities.   RIGHT paracentral disc  herniation at L5-S1 abutting the RIGHT S1 nerve root.   12/30/2017 Imaging   CT HEAD IMPRESSION: No acute intracranial abnormality or enhancing lesion.   01/16/2018 - 01/22/2019 Chemotherapy   Maintence Herceptin and Perjeta every 3 weeks for 6-12 months starting 01/16/18. Held 03/2018-07/17/18 due to low EF. Completed on 01/22/19    02/04/2018 PET scan   IMPRESSION: 1. The previous consolidation in the right middle lobe currently has a more nodular and reticular appearance with some  associated mild atelectasis along its anterior margin. This demonstrates low-grade metabolic activity with maximum SUV 3.2 (background blood pool activity is 2.7). Although conceivably related to low-grade residuum from the patient's malignancy, the presence of calcified right hilar lymph nodes and small calcifications along the nodularity raise suspicion for a low-grade active granulomatous process or atypical infectious process. Surveillance is recommended. 2. Scattered deposits of hypermetabolic but benign metabolically active brown fat.   02/23/2018 - 04/09/2018 Radiation Therapy   Radiation treatment dates:   02/23/2018 - 04/09/2018   Site/dose:   The patient initially received a dose of 50.4 Gy in 28 fractions to the right chest wall and supraclavicular region. This was delivered using a 3-D conformal, 4 field technique. The patient then received a boost to the mastectomy scar. This delivered an additional 10 Gy in 5 fractions using an en face electron field. The total dose was 60.4 Gy.   Narrative: The patient tolerated radiation treatment relatively well.   The patient had some expected skin irritation as she progressed during treatment. Moist desquamation was not present at the end of treatment.   03/20/2018 -  Anti-estrogen oral therapy   -Monthly Zoladex injection starting 03/20/18  -Letrozole 2.87m once dialy starting 05/03/18  -Switched to Anastrozole starting 07/2018 Due to her worsening back pain. -Changed to Exemestane in 04/2019 due to back pain exacerbation.    04/06/2018 Imaging   CT Chest wo contrast IMPRESSION: 1. Stable linear nodular thickening in the RIGHT middle lobe is favored post infectious or inflammatory scarring. 2. Calcified RIGHT hilar lymph node is consistent post infectious process. 3. Stable small LEFT upper lobe nodule. 4. Stable sclerotic lesion in the manubrium. Sclerotic lesion at T3 is more prominent (4 mm). Sclerotic lesion at L 1 is new  from 07/02/2017.   04/06/2018 Imaging   04/06/2018 CT Chest IMPRESSION: 1. Stable linear nodular thickening in the RIGHT middle lobe is favored post infectious or inflammatory scarring. 2. Calcified RIGHT hilar lymph node is consistent post infectious process. 3. Stable small LEFT upper lobe nodule. 4. Stable sclerotic lesion in the manubrium. Sclerotic lesion at T3 is more prominent (4 mm). Sclerotic lesion at L 1 is new from 07/02/2017.   04/10/2018 Echocardiogram   LV EF 45%-50%   05/25/2018 Echocardiogram   LV EF 45%-50%   07/13/2018 Echocardiogram   Normal LV size with EF 55-60%   07/31/2018 Imaging   07/31/2018 Bone Scan IMPRESSION: No evidence of skeletal metastatic disease.   07/31/2018 Imaging   07/31/2018 CT CAP IMPRESSION: Residual linear and nodular peribronchovascular opacities in the right middle lobe, with other scattered areas of ground-glass opacities in the right middle, right upper and right lower lobes, which are located within the previously demonstrated bulky airspace consolidation. The findings are improved from the chest CT dated 12/30/2017, and relatively stable from PET-CT dated 02/04/2018. These may represent post infectious/post inflammatory changes or residual malignancy.   Stable partially calcified right hilar lymphadenopathy.   Interval development of nodular soft tissue thickening within the anterior  mediastinum in the area of the thymus, new from chest CT dated 12/30/2017. This may represent reactive thymic tissue, thymoma or lymphadenopathy.   1.8 cm probably physiologic left ovarian cyst. Borderline thickening of the endometrium measuring 15.6 mm. Further evaluation with pelvic ultrasound may be considered.   No new or suspicious findings within the abdomen.   09/15/2018 Echocardiogram   EF 55-60%   01/28/2019 Imaging   MRI Lumbar spie 01/28/19  IMPRESSION: 1. Subcentimeter lesions in the L1 and L2 vertebral bodies, indeterminate  for metastases. 2. Mild lower lumbar disc degeneration without stenosis.   01/28/2019 Imaging   CT chest 01/28/19  IMPRESSION: Right lung post radiation changes. No active lung disease or other acute findings.   No evidence of recurrent or metastatic carcinoma within the thorax.   03/15/2019 - 02/2020 Chemotherapy   Anti-HER2 Neratinib 240 mg daily, starting 03/15/19. She completed 1 year of treatment in 02/2020      INTERVAL HISTORY:  Sheila [name] is here for a follow up of breast cancer. She was last seen by me on 05/18/21. She presents to the clinic alone. She denies any pain or new concerns from her last visit. She reports she still has hot flashes, which are still manageable. She notes her energy level is not great, and she usually needs a nap after work. She continues to work and go to school. She will finish school this winter. Because of these, she does not currently plan to move forward with BSO. She notes she is trying to diet because of her A1c going up.   All other systems were reviewed with the patient and are negative.  MEDICAL HISTORY:  Past Medical History:  Diagnosis Date   Anxiety    Cancer ([place])    right breast    Cardiomyopathy ([place])    Family history of breast cancer    Family history of colon cancer    GERD (gastroesophageal reflux disease)    Headache    couple of migraines in the past   History of kidney stones    Vitamin D deficiency     SURGICAL HISTORY: Past Surgical History:  Procedure Laterality Date   egg donation     kidney stone removal     MASS EXCISION Right 08/19/2019   Procedure: REMOVE EXCESS TISSUE FROM RIGHT LATERAL MASTECTOMY SCAR;  Surgeon: [name], MD;  Location: [place];  Service: General;  Laterality: Right;   MASTECTOMY W/ SENTINEL NODE BIOPSY Bilateral 07/22/2017   Procedure: RIGHT TOTAL MASTECTOMY WITH RIGHT AXILLARY SENTINEL LYMPH NODE BIOPSY, LEFT PROPHYLACTIC MASTECTOMY;  Surgeon: [name], MD;  Location: [place];  Service: General;  Laterality: Bilateral;   PORT-A-CATH REMOVAL Right 08/19/2019   Procedure: REMOVAL PORT-A-CATH;  Surgeon: [name], MD;  Location: [place];  Service: General;  Laterality: Right;   PORTACATH PLACEMENT Right 07/22/2017   Procedure: INSERTION PORT-A-CATH WITH [name];  Surgeon: [name], MD;  Location: [place];  Service: General;  Laterality: Right;    I have reviewed the social history and family history with the patient and they are unchanged from previous note.  ALLERGIES:  is allergic to eszopiclone.  MEDICATIONS:  Current Outpatient Medications  Medication Sig Dispense Refill   B Complex-C (B-COMPLEX WITH VITAMIN C) tablet Take 1 tablet by mouth daily.     calcium-vitamin D (OSCAL WITH D) 250-125 MG-UNIT tablet Take 1 tablet by mouth daily.     Cholecalciferol (VITAMIN D) 2000 units tablet Take  2,000 Units by mouth daily.     exemestane (AROMASIN) 25 MG tablet TAKE 1 TABLET BY MOUTH EVERY DAY AFTER BREAKFAST 90 tablet 3   fluticasone (FLONASE) 50 MCG/ACT nasal spray Place into both nostrils daily.     venlafaxine XR (EFFEXOR-XR) 75 MG 24 hr capsule TAKE 3 CAPSULES BY MOUTH EVERY DAY WITH BREAKFAST 90 capsule 0   Current Facility-Administered Medications  Medication Dose Route Frequency Provider Last Rate Last Admin   gi cocktail (Maalox,Lidocaine,Donnatal)  30 mL Oral Once Harle Stanford., PA-C        PHYSICAL EXAMINATION: ECOG PERFORMANCE STATUS: 0 - Asymptomatic  Vitals:   09/06/21 1423  BP: 110/69  Pulse: 97  Resp: 19  Temp: 98.5 F (36.9 C)  SpO2: 97%   Wt Readings from Last 3 Encounters:  09/06/21 182 lb (82.6 kg)  05/18/21 185 lb 8 oz (84.1 kg)  01/25/21 183 lb 11.2 oz (83.3 kg)     GENERAL:alert, no distress and comfortable SKIN: skin color, texture, turgor are normal, no rashes or significant lesions EYES: normal, Conjunctiva are pink and non-injected, sclera clear  NECK: supple,  thyroid normal size, non-tender, without nodularity LYMPH:  no palpable lymphadenopathy in the cervical, axillary  LUNGS: clear to auscultation and percussion with normal breathing effort HEART: regular rate & rhythm and no murmurs and no lower extremity edema ABDOMEN:abdomen soft, non-tender and normal bowel sounds Musculoskeletal:no cyanosis of digits and no clubbing  NEURO: alert & oriented x 3 with fluent speech, no focal motor/sensory deficits BREAST: No palpable mass, nodules or adenopathy bilaterally. Breast exam benign.   LABORATORY DATA:  I have reviewed the data as listed CBC Latest Ref Rng & Units 09/06/2021 05/18/2021 01/25/2021  WBC 4.0 - 10.5 K/uL 5.6 5.2 5.7  Hemoglobin 12.0 - 15.0 g/dL 12.6 13.0 12.6  Hematocrit 36.0 - 46.0 % 38.1 39.3 38.3  Platelets 150 - 400 K/uL 222 233 211     CMP Latest Ref Rng & Units 09/06/2021 05/18/2021 01/25/2021  Glucose 70 - 99 mg/dL 94 108(H) 116(H)  BUN 6 - 20 mg/dL _0 Creatinine 0.44 - 1.00 mg/dL 0.97 1.07(H) 1.02(H)  Sodium 135 - 145 mmol/L 140 143 139  Potassium 3.5 - 5.1 mmol/L 4.3 3.8 3.8  Chloride 98 - 111 mmol/L 104 106 106  CO2 22 - 32 mmol/L _1 Calcium 8.9 - 10.3 mg/dL 9.6 9.5 9.3  Total Protein 6.5 - 8.1 g/dL 8.1 7.6 7.4  Total Bilirubin 0.3 - 1.2 mg/dL 0.3 0.4 0.3  Alkaline Phos 38 - 126 U/L 100 119 118  AST 15 - 41 U/L 18 15 14(L)  ALT 0 - 44 U/L _2 RADIOGRAPHIC STUDIES: I have personally reviewed the radiological images as listed and agreed with the findings in the report. No results found.    No orders of the defined types were placed in this encounter.  All questions were answered. The patient knows to call the clinic with any problems, questions or concerns. No barriers to learning was detected. The total time spent in the appointment was 30 minutes.     [name], MD 09/06/2021   I, Wilburn Mylar, am acting as scribe for [name], MD.   I have reviewed the above documentation  for accuracy and completeness, and I agree with the above.

## 2021-09-07 LAB — CANCER ANTIGEN 27.29: CA 27.29: 13.5 U/mL (ref 0.0–38.6)

## 2021-09-21 ENCOUNTER — Other Ambulatory Visit: Payer: Self-pay | Admitting: Hematology

## 2021-10-04 ENCOUNTER — Other Ambulatory Visit: Payer: Self-pay

## 2021-10-04 ENCOUNTER — Inpatient Hospital Stay: Payer: BC Managed Care – PPO | Attending: Nurse Practitioner

## 2021-10-04 VITALS — BP 104/62 | HR 90 | Temp 98.1°F | Resp 18

## 2021-10-04 DIAGNOSIS — C50411 Malignant neoplasm of upper-outer quadrant of right female breast: Secondary | ICD-10-CM | POA: Insufficient documentation

## 2021-10-04 DIAGNOSIS — Z170 Estrogen receptor positive status [ER+]: Secondary | ICD-10-CM | POA: Insufficient documentation

## 2021-10-04 DIAGNOSIS — Z95828 Presence of other vascular implants and grafts: Secondary | ICD-10-CM

## 2021-10-04 MED ORDER — GOSERELIN ACETATE 3.6 MG ~~LOC~~ IMPL
3.6000 mg | DRUG_IMPLANT | Freq: Once | SUBCUTANEOUS | Status: AC
  Administered 2021-10-04: 16:00:00 3.6 mg via SUBCUTANEOUS
  Filled 2021-10-04: qty 3.6

## 2021-10-04 NOTE — Patient Instructions (Signed)
Goserelin injection °What is this medication? °GOSERELIN (GOE se rel in) is similar to a hormone found in the body. It lowers the amount of sex hormones that the body makes. Men will have lower testosterone levels and women will have lower estrogen levels while taking this medicine. In men, this medicine is used to treat prostate cancer; the injection is either given once per month or once every 12 weeks. A once per month injection (only) is used to treat women with endometriosis, dysfunctional uterine bleeding, or advanced breast cancer. °This medicine may be used for other purposes; ask your health care provider or pharmacist if you have questions. °COMMON BRAND NAME(S): Zoladex, Zoladex 3-Month °What should I tell my care team before I take this medication? °They need to know if you have any of these conditions: °bone problems °diabetes °heart disease °history of irregular heartbeat °an unusual or allergic reaction to goserelin, other medicines, foods, dyes, or preservatives °pregnant or trying to get pregnant °breast-feeding °How should I use this medication? °This medicine is for injection under the skin. It is given by a health care professional in a hospital or clinic setting. °Talk to your pediatrician regarding the use of this medicine in children. Special care may be needed. °Overdosage: If you think you have taken too much of this medicine contact a poison control center or emergency room at once. °NOTE: This medicine is only for you. Do not share this medicine with others. °What if I miss a dose? °It is important not to miss your dose. Call your doctor or health care professional if you are unable to keep an appointment. °What may interact with this medication? °Do not take this medicine with any of the following medications: °cisapride °dronedarone °pimozide °thioridazine °This medicine may also interact with the following medications: °other medicines that prolong the QT interval (an abnormal heart  rhythm) °This list may not describe all possible interactions. Give your health care provider a list of all the medicines, herbs, non-prescription drugs, or dietary supplements you use. Also tell them if you smoke, drink alcohol, or use illegal drugs. Some items may interact with your medicine. °What should I watch for while using this medication? °Visit your doctor or health care provider for regular checks on your progress. Your symptoms may appear to get worse during the first weeks of this therapy. Tell your doctor or healthcare provider if your symptoms do not start to get better or if they get worse after this time. °Your bones may get weaker if you take this medicine for a long time. If you smoke or frequently drink alcohol you may increase your risk of bone loss. A family history of osteoporosis, chronic use of drugs for seizures (convulsions), or corticosteroids can also increase your risk of bone loss. Talk to your doctor about how to keep your bones strong. °This medicine should stop regular monthly menstruation in women. Tell your doctor if you continue to menstruate. °Women should not become pregnant while taking this medicine or for 12 weeks after stopping this medicine. Women should inform their doctor if they wish to become pregnant or think they might be pregnant. There is a potential for serious side effects to an unborn child. Talk to your health care professional or pharmacist for more information. Do not breast-feed an infant while taking this medicine. °Men should inform their doctors if they wish to father a child. This medicine may lower sperm counts. Talk to your health care professional or pharmacist for more information. °This   medicine may increase blood sugar. Ask your healthcare provider if changes in diet or medicines are needed if you have diabetes. °What side effects may I notice from receiving this medication? °Side effects that you should report to your doctor or health care  professional as soon as possible: °allergic reactions like skin rash, itching or hives, swelling of the face, lips, or tongue °bone pain °breathing problems °changes in vision °chest pain °feeling faint or lightheaded, falls °fever, chills °pain, swelling, warmth in the leg °pain, tingling, numbness in the hands or feet °signs and symptoms of high blood sugar such as being more thirsty or hungry or having to urinate more than normal. You may also feel very tired or have blurry vision °signs and symptoms of low blood pressure like dizziness; feeling faint or lightheaded, falls; unusually weak or tired °stomach pain °swelling of the ankles, feet, hands °trouble passing urine or change in the amount of urine °unusually high or low blood pressure °unusually weak or tired °Side effects that usually do not require medical attention (report to your doctor or health care professional if they continue or are bothersome): °change in sex drive or performance °changes in breast size in both males and females °changes in emotions or moods °headache °hot flashes °irritation at site where injected °loss of appetite °skin problems like acne, dry skin °vaginal dryness °This list may not describe all possible side effects. Call your doctor for medical advice about side effects. You may report side effects to FDA at 1-800-FDA-1088. °Where should I keep my medication? °This drug is given in a hospital or clinic and will not be stored at home. °NOTE: This sheet is a summary. It may not cover all possible information. If you have questions about this medicine, talk to your doctor, pharmacist, or health care provider. °© 2022 [employer]/Gold Standard (2019-03-05 00:00:00) ° °

## 2021-10-21 ENCOUNTER — Other Ambulatory Visit: Payer: Self-pay | Admitting: Hematology

## 2021-11-01 ENCOUNTER — Other Ambulatory Visit: Payer: Self-pay

## 2021-11-01 ENCOUNTER — Inpatient Hospital Stay: Payer: BC Managed Care – PPO | Attending: Nurse Practitioner

## 2021-11-01 VITALS — BP 116/81 | HR 104 | Temp 98.4°F | Resp 17

## 2021-11-01 DIAGNOSIS — C50411 Malignant neoplasm of upper-outer quadrant of right female breast: Secondary | ICD-10-CM | POA: Diagnosis not present

## 2021-11-01 DIAGNOSIS — Z170 Estrogen receptor positive status [ER+]: Secondary | ICD-10-CM | POA: Diagnosis not present

## 2021-11-01 DIAGNOSIS — Z95828 Presence of other vascular implants and grafts: Secondary | ICD-10-CM

## 2021-11-01 MED ORDER — GOSERELIN ACETATE 3.6 MG ~~LOC~~ IMPL
3.6000 mg | DRUG_IMPLANT | Freq: Once | SUBCUTANEOUS | Status: AC
  Administered 2021-11-01: 3.6 mg via SUBCUTANEOUS
  Filled 2021-11-01: qty 3.6

## 2021-11-01 NOTE — Patient Instructions (Signed)
Goserelin injection °What is this medication? °GOSERELIN (GOE se rel in) is similar to a hormone found in the body. It lowers the amount of sex hormones that the body makes. Men will have lower testosterone levels and women will have lower estrogen levels while taking this medicine. In men, this medicine is used to treat prostate cancer; the injection is either given once per month or once every 12 weeks. A once per month injection (only) is used to treat women with endometriosis, dysfunctional uterine bleeding, or advanced breast cancer. °This medicine may be used for other purposes; ask your health care provider or pharmacist if you have questions. °COMMON BRAND NAME(S): Zoladex, Zoladex 3-Month °What should I tell my care team before I take this medication? °They need to know if you have any of these conditions: °bone problems °diabetes °heart disease °history of irregular heartbeat °an unusual or allergic reaction to goserelin, other medicines, foods, dyes, or preservatives °pregnant or trying to get pregnant °breast-feeding °How should I use this medication? °This medicine is for injection under the skin. It is given by a health care professional in a hospital or clinic setting. °Talk to your pediatrician regarding the use of this medicine in children. Special care may be needed. °Overdosage: If you think you have taken too much of this medicine contact a poison control center or emergency room at once. °NOTE: This medicine is only for you. Do not share this medicine with others. °What if I miss a dose? °It is important not to miss your dose. Call your doctor or health care professional if you are unable to keep an appointment. °What may interact with this medication? °Do not take this medicine with any of the following medications: °cisapride °dronedarone °pimozide °thioridazine °This medicine may also interact with the following medications: °other medicines that prolong the QT interval (an abnormal heart  rhythm) °This list may not describe all possible interactions. Give your health care provider a list of all the medicines, herbs, non-prescription drugs, or dietary supplements you use. Also tell them if you smoke, drink alcohol, or use illegal drugs. Some items may interact with your medicine. °What should I watch for while using this medication? °Visit your doctor or health care provider for regular checks on your progress. Your symptoms may appear to get worse during the first weeks of this therapy. Tell your doctor or healthcare provider if your symptoms do not start to get better or if they get worse after this time. °Your bones may get weaker if you take this medicine for a long time. If you smoke or frequently drink alcohol you may increase your risk of bone loss. A family history of osteoporosis, chronic use of drugs for seizures (convulsions), or corticosteroids can also increase your risk of bone loss. Talk to your doctor about how to keep your bones strong. °This medicine should stop regular monthly menstruation in women. Tell your doctor if you continue to menstruate. °Women should not become pregnant while taking this medicine or for 12 weeks after stopping this medicine. Women should inform their doctor if they wish to become pregnant or think they might be pregnant. There is a potential for serious side effects to an unborn child. Talk to your health care professional or pharmacist for more information. Do not breast-feed an infant while taking this medicine. °Men should inform their doctors if they wish to father a child. This medicine may lower sperm counts. Talk to your health care professional or pharmacist for more information. °This   medicine may increase blood sugar. Ask your healthcare provider if changes in diet or medicines are needed if you have diabetes. °What side effects may I notice from receiving this medication? °Side effects that you should report to your doctor or health care  professional as soon as possible: °allergic reactions like skin rash, itching or hives, swelling of the face, lips, or tongue °bone pain °breathing problems °changes in vision °chest pain °feeling faint or lightheaded, falls °fever, chills °pain, swelling, warmth in the leg °pain, tingling, numbness in the hands or feet °signs and symptoms of high blood sugar such as being more thirsty or hungry or having to urinate more than normal. You may also feel very tired or have blurry vision °signs and symptoms of low blood pressure like dizziness; feeling faint or lightheaded, falls; unusually weak or tired °stomach pain °swelling of the ankles, feet, hands °trouble passing urine or change in the amount of urine °unusually high or low blood pressure °unusually weak or tired °Side effects that usually do not require medical attention (report to your doctor or health care professional if they continue or are bothersome): °change in sex drive or performance °changes in breast size in both males and females °changes in emotions or moods °headache °hot flashes °irritation at site where injected °loss of appetite °skin problems like acne, dry skin °vaginal dryness °This list may not describe all possible side effects. Call your doctor for medical advice about side effects. You may report side effects to FDA at 1-800-FDA-1088. °Where should I keep my medication? °This drug is given in a hospital or clinic and will not be stored at home. °NOTE: This sheet is a summary. It may not cover all possible information. If you have questions about this medicine, talk to your doctor, pharmacist, or health care provider. °© 2022 [employer]/Gold Standard (2019-03-05 00:00:00) ° °

## 2021-11-20 ENCOUNTER — Other Ambulatory Visit: Payer: Self-pay | Admitting: Hematology

## 2021-11-20 NOTE — Telephone Encounter (Signed)
Per last clinic note, refill is appropriate. Gardiner Rhyme, RN

## 2021-11-29 ENCOUNTER — Inpatient Hospital Stay: Payer: BC Managed Care – PPO | Attending: Nurse Practitioner

## 2021-11-29 ENCOUNTER — Inpatient Hospital Stay: Payer: BC Managed Care – PPO

## 2021-11-29 ENCOUNTER — Other Ambulatory Visit: Payer: Self-pay

## 2021-11-29 VITALS — BP 105/79 | HR 99 | Temp 98.9°F | Resp 18

## 2021-11-29 DIAGNOSIS — Z170 Estrogen receptor positive status [ER+]: Secondary | ICD-10-CM | POA: Insufficient documentation

## 2021-11-29 DIAGNOSIS — C50411 Malignant neoplasm of upper-outer quadrant of right female breast: Secondary | ICD-10-CM

## 2021-11-29 DIAGNOSIS — Z95828 Presence of other vascular implants and grafts: Secondary | ICD-10-CM

## 2021-11-29 LAB — CBC WITH DIFFERENTIAL/PLATELET
Abs Immature Granulocytes: 0.01 10*3/uL (ref 0.00–0.07)
Basophils Absolute: 0 10*3/uL (ref 0.0–0.1)
Basophils Relative: 1 %
Eosinophils Absolute: 0.1 10*3/uL (ref 0.0–0.5)
Eosinophils Relative: 2 %
HCT: 38.5 % (ref 36.0–46.0)
Hemoglobin: 12.8 g/dL (ref 12.0–15.0)
Immature Granulocytes: 0 %
Lymphocytes Relative: 28 %
Lymphs Abs: 1.6 10*3/uL (ref 0.7–4.0)
MCH: 29 pg (ref 26.0–34.0)
MCHC: 33.2 g/dL (ref 30.0–36.0)
MCV: 87.3 fL (ref 80.0–100.0)
Monocytes Absolute: 0.4 10*3/uL (ref 0.1–1.0)
Monocytes Relative: 7 %
Neutro Abs: 3.5 10*3/uL (ref 1.7–7.7)
Neutrophils Relative %: 62 %
Platelets: 239 10*3/uL (ref 150–400)
RBC: 4.41 MIL/uL (ref 3.87–5.11)
RDW: 13.3 % (ref 11.5–15.5)
WBC: 5.6 10*3/uL (ref 4.0–10.5)
nRBC: 0 % (ref 0.0–0.2)

## 2021-11-29 LAB — COMPREHENSIVE METABOLIC PANEL
ALT: 14 U/L (ref 0–44)
AST: 17 U/L (ref 15–41)
Albumin: 4.6 g/dL (ref 3.5–5.0)
Alkaline Phosphatase: 120 U/L (ref 38–126)
Anion gap: 6 (ref 5–15)
BUN: 11 mg/dL (ref 6–20)
CO2: 29 mmol/L (ref 22–32)
Calcium: 10 mg/dL (ref 8.9–10.3)
Chloride: 103 mmol/L (ref 98–111)
Creatinine, Ser: 1.11 mg/dL — ABNORMAL HIGH (ref 0.44–1.00)
GFR, Estimated: >60 mL/min (ref >60)
Glucose, Bld: 94 mg/dL (ref 70–99)
Potassium: 4.8 mmol/L (ref 3.5–5.1)
Sodium: 138 mmol/L (ref 135–145)
Total Bilirubin: 0.6 mg/dL (ref 0.3–1.2)
Total Protein: 7.8 g/dL (ref 6.5–8.1)

## 2021-11-29 MED ORDER — GOSERELIN ACETATE 3.6 MG ~~LOC~~ IMPL
3.6000 mg | DRUG_IMPLANT | Freq: Once | SUBCUTANEOUS | Status: AC
  Administered 2021-11-29: 3.6 mg via SUBCUTANEOUS
  Filled 2021-11-29: qty 3.6

## 2021-11-30 LAB — CANCER ANTIGEN 27.29: CA 27.29: 5.4 U/mL (ref 0.0–38.6)

## 2021-12-20 ENCOUNTER — Other Ambulatory Visit: Payer: Self-pay | Admitting: Hematology

## 2021-12-27 ENCOUNTER — Inpatient Hospital Stay: Payer: BC Managed Care – PPO | Attending: Nurse Practitioner

## 2021-12-27 ENCOUNTER — Other Ambulatory Visit: Payer: Self-pay

## 2021-12-27 VITALS — BP 115/73 | HR 92 | Temp 99.1°F | Resp 18

## 2021-12-27 DIAGNOSIS — Z95828 Presence of other vascular implants and grafts: Secondary | ICD-10-CM

## 2021-12-27 DIAGNOSIS — Z170 Estrogen receptor positive status [ER+]: Secondary | ICD-10-CM | POA: Diagnosis not present

## 2021-12-27 DIAGNOSIS — C50411 Malignant neoplasm of upper-outer quadrant of right female breast: Secondary | ICD-10-CM | POA: Insufficient documentation

## 2021-12-27 MED ORDER — GOSERELIN ACETATE 3.6 MG ~~LOC~~ IMPL
3.6000 mg | DRUG_IMPLANT | Freq: Once | SUBCUTANEOUS | Status: AC
  Administered 2021-12-27: 3.6 mg via SUBCUTANEOUS
  Filled 2021-12-27: qty 3.6

## 2021-12-27 NOTE — Patient Instructions (Signed)
Goserelin injection °What is this medication? °GOSERELIN (GOE se rel in) is similar to a hormone found in the body. It lowers the amount of sex hormones that the body makes. Men will have lower testosterone levels and women will have lower estrogen levels while taking this medicine. In men, this medicine is used to treat prostate cancer; the injection is either given once per month or once every 12 weeks. A once per month injection (only) is used to treat women with endometriosis, dysfunctional uterine bleeding, or advanced breast cancer. °This medicine may be used for other purposes; ask your health care provider or pharmacist if you have questions. °COMMON BRAND NAME(S): Zoladex, Zoladex 3-Month °What should I tell my care team before I take this medication? °They need to know if you have any of these conditions: °bone problems °diabetes °heart disease °history of irregular heartbeat °an unusual or allergic reaction to goserelin, other medicines, foods, dyes, or preservatives °pregnant or trying to get pregnant °breast-feeding °How should I use this medication? °This medicine is for injection under the skin. It is given by a health care professional in a hospital or clinic setting. °Talk to your pediatrician regarding the use of this medicine in children. Special care may be needed. °Overdosage: If you think you have taken too much of this medicine contact a poison control center or emergency room at once. °NOTE: This medicine is only for you. Do not share this medicine with others. °What if I miss a dose? °It is important not to miss your dose. Call your doctor or health care professional if you are unable to keep an appointment. °What may interact with this medication? °Do not take this medicine with any of the following medications: °cisapride °dronedarone °pimozide °thioridazine °This medicine may also interact with the following medications: °other medicines that prolong the QT interval (an abnormal heart  rhythm) °This list may not describe all possible interactions. Give your health care provider a list of all the medicines, herbs, non-prescription drugs, or dietary supplements you use. Also tell them if you smoke, drink alcohol, or use illegal drugs. Some items may interact with your medicine. °What should I watch for while using this medication? °Visit your doctor or health care provider for regular checks on your progress. Your symptoms may appear to get worse during the first weeks of this therapy. Tell your doctor or healthcare provider if your symptoms do not start to get better or if they get worse after this time. °Your bones may get weaker if you take this medicine for a long time. If you smoke or frequently drink alcohol you may increase your risk of bone loss. A family history of osteoporosis, chronic use of drugs for seizures (convulsions), or corticosteroids can also increase your risk of bone loss. Talk to your doctor about how to keep your bones strong. °This medicine should stop regular monthly menstruation in women. Tell your doctor if you continue to menstruate. °Women should not become pregnant while taking this medicine or for 12 weeks after stopping this medicine. Women should inform their doctor if they wish to become pregnant or think they might be pregnant. There is a potential for serious side effects to an unborn child. Talk to your health care professional or pharmacist for more information. Do not breast-feed an infant while taking this medicine. °Men should inform their doctors if they wish to father a child. This medicine may lower sperm counts. Talk to your health care professional or pharmacist for more information. °This   medicine may increase blood sugar. Ask your healthcare provider if changes in diet or medicines are needed if you have diabetes. °What side effects may I notice from receiving this medication? °Side effects that you should report to your doctor or health care  professional as soon as possible: °allergic reactions like skin rash, itching or hives, swelling of the face, lips, or tongue °bone pain °breathing problems °changes in vision °chest pain °feeling faint or lightheaded, falls °fever, chills °pain, swelling, warmth in the leg °pain, tingling, numbness in the hands or feet °signs and symptoms of high blood sugar such as being more thirsty or hungry or having to urinate more than normal. You may also feel very tired or have blurry vision °signs and symptoms of low blood pressure like dizziness; feeling faint or lightheaded, falls; unusually weak or tired °stomach pain °swelling of the ankles, feet, hands °trouble passing urine or change in the amount of urine °unusually high or low blood pressure °unusually weak or tired °Side effects that usually do not require medical attention (report to your doctor or health care professional if they continue or are bothersome): °change in sex drive or performance °changes in breast size in both males and females °changes in emotions or moods °headache °hot flashes °irritation at site where injected °loss of appetite °skin problems like acne, dry skin °vaginal dryness °This list may not describe all possible side effects. Call your doctor for medical advice about side effects. You may report side effects to FDA at 1-800-FDA-1088. °Where should I keep my medication? °This drug is given in a hospital or clinic and will not be stored at home. °NOTE: This sheet is a summary. It may not cover all possible information. If you have questions about this medicine, talk to your doctor, pharmacist, or health care provider. °© 2022 [employer]/Gold Standard (2019-03-05 00:00:00) ° °

## 2022-01-14 ENCOUNTER — Other Ambulatory Visit: Payer: Self-pay | Admitting: Hematology

## 2022-01-24 ENCOUNTER — Other Ambulatory Visit: Payer: Self-pay

## 2022-01-24 ENCOUNTER — Inpatient Hospital Stay: Payer: BC Managed Care – PPO | Attending: Nurse Practitioner

## 2022-01-24 VITALS — BP 96/51 | HR 116 | Temp 98.7°F | Resp 18

## 2022-01-24 DIAGNOSIS — Z5111 Encounter for antineoplastic chemotherapy: Secondary | ICD-10-CM | POA: Diagnosis not present

## 2022-01-24 DIAGNOSIS — C50411 Malignant neoplasm of upper-outer quadrant of right female breast: Secondary | ICD-10-CM | POA: Diagnosis not present

## 2022-01-24 DIAGNOSIS — Z170 Estrogen receptor positive status [ER+]: Secondary | ICD-10-CM

## 2022-01-24 DIAGNOSIS — Z95828 Presence of other vascular implants and grafts: Secondary | ICD-10-CM

## 2022-01-24 MED ORDER — GOSERELIN ACETATE 3.6 MG ~~LOC~~ IMPL
3.6000 mg | DRUG_IMPLANT | Freq: Once | SUBCUTANEOUS | Status: AC
  Administered 2022-01-24: 16:00:00 3.6 mg via SUBCUTANEOUS
  Filled 2022-01-24: qty 3.6

## 2022-01-24 NOTE — Patient Instructions (Signed)
Goserelin injection °What is this medication? °GOSERELIN (GOE se rel in) is similar to a hormone found in the body. It lowers the amount of sex hormones that the body makes. Men will have lower testosterone levels and women will have lower estrogen levels while taking this medicine. In men, this medicine is used to treat prostate cancer; the injection is either given once per month or once every 12 weeks. A once per month injection (only) is used to treat women with endometriosis, dysfunctional uterine bleeding, or advanced breast cancer. °This medicine may be used for other purposes; ask your health care provider or pharmacist if you have questions. °COMMON BRAND NAME(S): Zoladex, Zoladex 3-Month °What should I tell my care team before I take this medication? °They need to know if you have any of these conditions: °bone problems °diabetes °heart disease °history of irregular heartbeat °an unusual or allergic reaction to goserelin, other medicines, foods, dyes, or preservatives °pregnant or trying to get pregnant °breast-feeding °How should I use this medication? °This medicine is for injection under the skin. It is given by a health care professional in a hospital or clinic setting. °Talk to your pediatrician regarding the use of this medicine in children. Special care may be needed. °Overdosage: If you think you have taken too much of this medicine contact a poison control center or emergency room at once. °NOTE: This medicine is only for you. Do not share this medicine with others. °What if I miss a dose? °It is important not to miss your dose. Call your doctor or health care professional if you are unable to keep an appointment. °What may interact with this medication? °Do not take this medicine with any of the following medications: °cisapride °dronedarone °pimozide °thioridazine °This medicine may also interact with the following medications: °other medicines that prolong the QT interval (an abnormal heart  rhythm) °This list may not describe all possible interactions. Give your health care provider a list of all the medicines, herbs, non-prescription drugs, or dietary supplements you use. Also tell them if you smoke, drink alcohol, or use illegal drugs. Some items may interact with your medicine. °What should I watch for while using this medication? °Visit your doctor or health care provider for regular checks on your progress. Your symptoms may appear to get worse during the first weeks of this therapy. Tell your doctor or healthcare provider if your symptoms do not start to get better or if they get worse after this time. °Your bones may get weaker if you take this medicine for a long time. If you smoke or frequently drink alcohol you may increase your risk of bone loss. A family history of osteoporosis, chronic use of drugs for seizures (convulsions), or corticosteroids can also increase your risk of bone loss. Talk to your doctor about how to keep your bones strong. °This medicine should stop regular monthly menstruation in women. Tell your doctor if you continue to menstruate. °Women should not become pregnant while taking this medicine or for 12 weeks after stopping this medicine. Women should inform their doctor if they wish to become pregnant or think they might be pregnant. There is a potential for serious side effects to an unborn child. Talk to your health care professional or pharmacist for more information. Do not breast-feed an infant while taking this medicine. °Men should inform their doctors if they wish to father a child. This medicine may lower sperm counts. Talk to your health care professional or pharmacist for more information. °This   medicine may increase blood sugar. Ask your healthcare provider if changes in diet or medicines are needed if you have diabetes. °What side effects may I notice from receiving this medication? °Side effects that you should report to your doctor or health care  professional as soon as possible: °allergic reactions like skin rash, itching or hives, swelling of the face, lips, or tongue °bone pain °breathing problems °changes in vision °chest pain °feeling faint or lightheaded, falls °fever, chills °pain, swelling, warmth in the leg °pain, tingling, numbness in the hands or feet °signs and symptoms of high blood sugar such as being more thirsty or hungry or having to urinate more than normal. You may also feel very tired or have blurry vision °signs and symptoms of low blood pressure like dizziness; feeling faint or lightheaded, falls; unusually weak or tired °stomach pain °swelling of the ankles, feet, hands °trouble passing urine or change in the amount of urine °unusually high or low blood pressure °unusually weak or tired °Side effects that usually do not require medical attention (report to your doctor or health care professional if they continue or are bothersome): °change in sex drive or performance °changes in breast size in both males and females °changes in emotions or moods °headache °hot flashes °irritation at site where injected °loss of appetite °skin problems like acne, dry skin °vaginal dryness °This list may not describe all possible side effects. Call your doctor for medical advice about side effects. You may report side effects to FDA at 1-800-FDA-1088. °Where should I keep my medication? °This drug is given in a hospital or clinic and will not be stored at home. °NOTE: This sheet is a summary. It may not cover all possible information. If you have questions about this medicine, talk to your doctor, pharmacist, or health care provider. °© 2022 [employer]/Gold Standard (2019-03-05 00:00:00) ° °

## 2022-02-21 ENCOUNTER — Other Ambulatory Visit: Payer: Self-pay | Admitting: Hematology

## 2022-02-22 ENCOUNTER — Inpatient Hospital Stay (HOSPITAL_BASED_OUTPATIENT_CLINIC_OR_DEPARTMENT_OTHER): Payer: BC Managed Care – PPO | Admitting: Hematology

## 2022-02-22 ENCOUNTER — Encounter: Payer: Self-pay | Admitting: Hematology

## 2022-02-22 ENCOUNTER — Inpatient Hospital Stay: Payer: BC Managed Care – PPO

## 2022-02-22 ENCOUNTER — Inpatient Hospital Stay: Payer: BC Managed Care – PPO | Attending: Nurse Practitioner

## 2022-02-22 ENCOUNTER — Other Ambulatory Visit: Payer: Self-pay

## 2022-02-22 VITALS — BP 103/77 | HR 112 | Temp 99.0°F | Resp 17 | Wt 168.6 lb

## 2022-02-22 DIAGNOSIS — Z5111 Encounter for antineoplastic chemotherapy: Secondary | ICD-10-CM | POA: Insufficient documentation

## 2022-02-22 DIAGNOSIS — Z170 Estrogen receptor positive status [ER+]: Secondary | ICD-10-CM

## 2022-02-22 DIAGNOSIS — C50411 Malignant neoplasm of upper-outer quadrant of right female breast: Secondary | ICD-10-CM | POA: Insufficient documentation

## 2022-02-22 DIAGNOSIS — Z95828 Presence of other vascular implants and grafts: Secondary | ICD-10-CM

## 2022-02-22 LAB — CBC WITH DIFFERENTIAL/PLATELET
Abs Immature Granulocytes: 0.01 10*3/uL (ref 0.00–0.07)
Basophils Absolute: 0.1 10*3/uL (ref 0.0–0.1)
Basophils Relative: 1 %
Eosinophils Absolute: 0.1 10*3/uL (ref 0.0–0.5)
Eosinophils Relative: 2 %
HCT: 38.2 % (ref 36.0–46.0)
Hemoglobin: 12.6 g/dL (ref 12.0–15.0)
Immature Granulocytes: 0 %
Lymphocytes Relative: 26 %
Lymphs Abs: 1.4 10*3/uL (ref 0.7–4.0)
MCH: 28.7 pg (ref 26.0–34.0)
MCHC: 33 g/dL (ref 30.0–36.0)
MCV: 87 fL (ref 80.0–100.0)
Monocytes Absolute: 0.4 10*3/uL (ref 0.1–1.0)
Monocytes Relative: 7 %
Neutro Abs: 3.4 10*3/uL (ref 1.7–7.7)
Neutrophils Relative %: 64 %
Platelets: 224 10*3/uL (ref 150–400)
RBC: 4.39 MIL/uL (ref 3.87–5.11)
RDW: 13.3 % (ref 11.5–15.5)
WBC: 5.4 10*3/uL (ref 4.0–10.5)
nRBC: 0 % (ref 0.0–0.2)

## 2022-02-22 LAB — COMPREHENSIVE METABOLIC PANEL
ALT: 10 U/L (ref 0–44)
AST: 14 U/L — ABNORMAL LOW (ref 15–41)
Albumin: 4.6 g/dL (ref 3.5–5.0)
Alkaline Phosphatase: 117 U/L (ref 38–126)
Anion gap: 9 (ref 5–15)
BUN: 11 mg/dL (ref 6–20)
CO2: 26 mmol/L (ref 22–32)
Calcium: 9.9 mg/dL (ref 8.9–10.3)
Chloride: 105 mmol/L (ref 98–111)
Creatinine, Ser: 1.09 mg/dL — ABNORMAL HIGH (ref 0.44–1.00)
GFR, Estimated: >60 mL/min (ref >60)
Glucose, Bld: 135 mg/dL — ABNORMAL HIGH (ref 70–99)
Potassium: 4.3 mmol/L (ref 3.5–5.1)
Sodium: 140 mmol/L (ref 135–145)
Total Bilirubin: 0.6 mg/dL (ref 0.3–1.2)
Total Protein: 7.9 g/dL (ref 6.5–8.1)

## 2022-02-22 MED ORDER — GOSERELIN ACETATE 3.6 MG ~~LOC~~ IMPL
3.6000 mg | DRUG_IMPLANT | Freq: Once | SUBCUTANEOUS | Status: AC
  Administered 2022-02-22: 3.6 mg via SUBCUTANEOUS
  Filled 2022-02-22: qty 3.6

## 2022-02-22 MED ORDER — EXEMESTANE 25 MG PO TABS: ORAL_TABLET | ORAL | 3 refills | Status: DC

## 2022-02-22 NOTE — Patient Instructions (Signed)
Goserelin injection °What is this medication? °GOSERELIN (GOE se rel in) is similar to a hormone found in the body. It lowers the amount of sex hormones that the body makes. Men will have lower testosterone levels and women will have lower estrogen levels while taking this medicine. In men, this medicine is used to treat prostate cancer; the injection is either given once per month or once every 12 weeks. A once per month injection (only) is used to treat women with endometriosis, dysfunctional uterine bleeding, or advanced breast cancer. °This medicine may be used for other purposes; ask your health care provider or pharmacist if you have questions. °COMMON BRAND NAME(S): Zoladex, Zoladex 3-Month °What should I tell my care team before I take this medication? °They need to know if you have any of these conditions: °bone problems °diabetes °heart disease °history of irregular heartbeat °an unusual or allergic reaction to goserelin, other medicines, foods, dyes, or preservatives °pregnant or trying to get pregnant °breast-feeding °How should I use this medication? °This medicine is for injection under the skin. It is given by a health care professional in a hospital or clinic setting. °Talk to your pediatrician regarding the use of this medicine in children. Special care may be needed. °Overdosage: If you think you have taken too much of this medicine contact a poison control center or emergency room at once. °NOTE: This medicine is only for you. Do not share this medicine with others. °What if I miss a dose? °It is important not to miss your dose. Call your doctor or health care professional if you are unable to keep an appointment. °What may interact with this medication? °Do not take this medicine with any of the following medications: °cisapride °dronedarone °pimozide °thioridazine °This medicine may also interact with the following medications: °other medicines that prolong the QT interval (an abnormal heart  rhythm) °This list may not describe all possible interactions. Give your health care provider a list of all the medicines, herbs, non-prescription drugs, or dietary supplements you use. Also tell them if you smoke, drink alcohol, or use illegal drugs. Some items may interact with your medicine. °What should I watch for while using this medication? °Visit your doctor or health care provider for regular checks on your progress. Your symptoms may appear to get worse during the first weeks of this therapy. Tell your doctor or healthcare provider if your symptoms do not start to get better or if they get worse after this time. °Your bones may get weaker if you take this medicine for a long time. If you smoke or frequently drink alcohol you may increase your risk of bone loss. A family history of osteoporosis, chronic use of drugs for seizures (convulsions), or corticosteroids can also increase your risk of bone loss. Talk to your doctor about how to keep your bones strong. °This medicine should stop regular monthly menstruation in women. Tell your doctor if you continue to menstruate. °Women should not become pregnant while taking this medicine or for 12 weeks after stopping this medicine. Women should inform their doctor if they wish to become pregnant or think they might be pregnant. There is a potential for serious side effects to an unborn child. Talk to your health care professional or pharmacist for more information. Do not breast-feed an infant while taking this medicine. °Men should inform their doctors if they wish to father a child. This medicine may lower sperm counts. Talk to your health care professional or pharmacist for more information. °This   medicine may increase blood sugar. Ask your healthcare provider if changes in diet or medicines are needed if you have diabetes. °What side effects may I notice from receiving this medication? °Side effects that you should report to your doctor or health care  professional as soon as possible: °allergic reactions like skin rash, itching or hives, swelling of the face, lips, or tongue °bone pain °breathing problems °changes in vision °chest pain °feeling faint or lightheaded, falls °fever, chills °pain, swelling, warmth in the leg °pain, tingling, numbness in the hands or feet °signs and symptoms of high blood sugar such as being more thirsty or hungry or having to urinate more than normal. You may also feel very tired or have blurry vision °signs and symptoms of low blood pressure like dizziness; feeling faint or lightheaded, falls; unusually weak or tired °stomach pain °swelling of the ankles, feet, hands °trouble passing urine or change in the amount of urine °unusually high or low blood pressure °unusually weak or tired °Side effects that usually do not require medical attention (report to your doctor or health care professional if they continue or are bothersome): °change in sex drive or performance °changes in breast size in both males and females °changes in emotions or moods °headache °hot flashes °irritation at site where injected °loss of appetite °skin problems like acne, dry skin °vaginal dryness °This list may not describe all possible side effects. Call your doctor for medical advice about side effects. You may report side effects to FDA at 1-800-FDA-1088. °Where should I keep my medication? °This drug is given in a hospital or clinic and will not be stored at home. °NOTE: This sheet is a summary. It may not cover all possible information. If you have questions about this medicine, talk to your doctor, pharmacist, or health care provider. °© 2022 [employer]/Gold Standard (2019-03-05 00:00:00) ° °

## 2022-02-22 NOTE — Progress Notes (Signed)
?[facility]   ?[phone] [phone]   ?Clinic Follow up Note  ? ?Patient Care Team: ?Kristen Loader, FNP as PCP - General (Family Medicine) ?[name] (Family Medicine) ?[name], MD as Consulting Physician (General Surgery) ?[name], MD as Consulting Physician (Hematology) ?Kyung Rudd, MD as Consulting Physician (Radiation Oncology) ? ?Date of Service:  02/22/2022 ? ?CHIEF COMPLAINT: f/u of right breast cancer ? ?CURRENT THERAPY:  ?-Monthly Zoladex injections starting 03/20/18 ?-antiestrogen therapy since 05/03/18, currently exemestane since 04/2019 ? ?ASSESSMENT & PLAN:  ?Sheila [name] is a 46 y.o. female with  ? ?1. Malignant neoplasm of upper-outer quadrant of right breast in female, mixed invasive ductal and lobular carcinoma, pT3(m)N2aM0, stage 1b, ductal carcinoma triple positive, lobular carcinoma ER+/PR+/HER2-,  Grade 2 ?-She was diagnosed in 05/2017. She is s/p b/l mastectomy, adjuvant TCHP, radiation, maintenance Herceptin/Perjeta, and 1 year of Neratinib.  ?-She started adjuvant letrozole with monthly Zoladex injections in 04/2018.  Letrozole was subsequently changed to anastrozole, then exemestane due to some joint and back pain. She tolerates exemestane well with mild hot flashes, tolerable.  ?-She is clinically doing very well.  Lab reviewed, all WNL. Physical exam was unremarkable, there is no clinical concern for recurrence. ?-She is still considering BSO, likely the end of 2023 I advised I can refer her to GYN whenever she is ready. Otherwise, we will continue Zoladex injection every 4 weeks. ?-f/u in 6 months ?   ?2. Hot flash  ?-Manageable. Continue Effexor 224m daily.  ?-Will monitor on exemestane  ?  ?3. Bone Health  ?-Continue Vitamin D supplement and weight bearing exercise.  ?-Her 08/2020 baseline DEXA was normal with lowest T-score -0.6 at left hip.   ?  ?4. Back pain, knee pain  ?-Her CT Chest on 04/06/18 showed sclerotic lesions of  spine in T3 and L1, but her bone scan in 07/2018 was negative ?-MRI lumbar from 01/28/19 shows indeterminate lesions of L1 and L2. ?-She takes tylenol for pain. Manageable on AI ? ?  ?PLAN:  ?-Proceed with Zoladex today and every 4 weeks ?-Continue Exemestane  ?-lab and f/u in 6 months ? ? ?No problem-specific Assessment & Plan notes found for this encounter. ? ? ?SUMMARY OF ONCOLOGIC HISTORY: ?Oncology History Overview Note  ? ?Cancer Staging ?Malignant neoplasm of upper-outer quadrant of right breast in female, estrogen receptor positive ([facility] ?Staging form: Breast, AJCC 8th Edition ?- Clinical stage from 06/11/2017: Stage IB (cT3, cN0, cM0, G2, ER: Positive, PR: Positive, HER2: Positive) - Signed by FTruitt Merle MD on 06/16/2017 ?- Pathologic stage from 07/22/2017: Stage IB (pT3(m), pN2a(sn), cM0, G2, ER: Positive, PR: Positive, HER2: Positive) - Signed by BAlla Feeling NP on 08/04/2017 ? ?  ?Malignant neoplasm of upper-outer quadrant of right breast in female, estrogen receptor positive ([facility]  ?06/11/2017 Initial Biopsy  ? Diagnosis 06/11/17 ?Breast, right, needle core biopsy, 11:00 o'clock ?- INVASIVE DUCTAL CARCINOMA, G2 ?  ?06/11/2017 Mammogram  ? [facility] Diagnostic Breast Tomo Bilateral 06/11/17 ?IMPRESSION: ?1. Highly suspicious mass within the retroareolar right breast, extending from the 8:00 to 11:00 axes, anterior to posterior depth, extending anteriorly to the nipple with associated nipple retraction, measuring at least 5.5 cm by ultrasound  ? 2. No sonographic evidence of metastatic lymphadenopathy in the right axilla. ? 3. No evidence of malignancy within the left breast. ? ?  ?06/11/2017 Initial Diagnosis  ? Malignant neoplasm of upper-outer quadrant of right breast in female, estrogen receptor positive ([facility] ?  ?  06/11/2017 Receptors her2  ? Estrogen Receptor: 100%, POSITIVE, STRONG STAINING INTENSITY ?Progesterone Receptor: 100%, POSITIVE, STRONG STAINING INTENSITY ?Proliferation Marker Ki67:  12%Proliferation Marker Ki67: 12% ? ?HER2 - **POSITIVE** ?RATIO OF HER2/CEP17 SIGNALS 2.55 ?AVERAGE HER2 COPY NUMBER PER CELL 4.85 ?  ?06/25/2017 Genetic Testing  ? Negative genetic testing on the 9 gene STAT panel.  The STAT Breast cancer panel offered by [facility] includes sequencing and rearrangement analysis for the following 9 genes:  ATM, BRCA1, BRCA2, CDH1, CHEK2, PALB2, PTEN, STK11 and TP53.   The report date is June 25, 2017. ? ?Negative genetic testing on the common hereditary cancer panel.  The Hereditary Gene Panel offered by [facility] includes sequencing and/or deletion duplication testing of the following 46 genes: APC, ATM, AXIN2, BARD1, BMPR1A, BRCA1, BRCA2, BRIP1, CDH1, CDKN2A (p14ARF), CDKN2A (p16INK4a), CHEK2, CTNNA1, DICER1, EPCAM (Deletion/duplication testing only), GREM1 (promoter region deletion/duplication testing only), KIT, MEN1, MLH1, MSH2, MSH3, MSH6, MUTYH, NBN, NF1, NHTL1, PALB2, PDGFRA, PMS2, POLD1, POLE, PTEN, RAD50, RAD51C, RAD51D, SDHB, SDHC, SDHD, SMAD4, SMARCA4. STK11, TP53, TSC1, TSC2, and VHL.  The following genes were evaluated for sequence changes only: SDHA and HOXB13 c.251G>A variant only.  The report date is June 25, 2017. ? ?  ?07/02/2017 Imaging  ? CT cAP 07/02/17 ?IMPRESSION: ?1. Subareolar right breast mass. No compelling findings of nodal or ?metastatic involvement. ?2. Old granulomatous disease. ?3. There is a 7 mm enhancing or hyperdense focus posteriorly in ?segment 7 of the liver in the subcapsular region. By virtue of its ?small size this lesion is technically nonspecific although ?statistically likely to be a small benign lesions such as flash ?filling hemangioma. This may warrant surveillance. ?4.  Prominent stool throughout the colon favors constipation. ?5. 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 ?  ?07/02/2017 Imaging  ? Bone scan 07/02/17 ?IMPRESSION: ?Today' s exam is negative. I ascribed the tiny focus of activity ?just  proximal to the left antecubital region to injection site. ?  ?07/22/2017 Surgery  ? RIGHT TOTAL MASTECTOMY WITH RIGHT AXILLARY SENTINEL LYMPH NODE BIOPSY, LEFT PROPHYLACTIC MASTECTOM and INSERTION PORT-A-CATH WITH [name] by [name] and [place] ? ?  ?07/22/2017 Pathology Results  ? Diagnosis ?1. Breast, simple mastectomy, Left ?- FIBROADENOMA. ?- NO MALIGNANCY IDENTIFIED. ?2. Breast, simple mastectomy, Right ?- MIXED INVASIVE LOBULAR AND DUCTAL CARCINOMA, GRADE 2, SPANNING 5.3 CM. ?- ADDITIONAL FOCUS OF LOBULAR CARCINOMA, GRADE 2, SPANNING 2.2 CM. ?- INTERMEDIATE GRADE DUCTAL CARCINOMA IN SITU. ?- INVASIVE CARCINOMA COMES TO WITHIN 0.2 TO 0.3 CM OF THE DEEP MARGIN, FOCALLY. ?- TUMOR FOCALLY INVOLVES EPIDERMIS. ?- LYMPHOVASCULAR INVASION OF DERMAL LYMPHATICS. ?- ONE OF ONE LYMPH NODES NEGATIVE FOR CARCINOMA (0/1). ?- SEE ONCOLOGY TABLE. ?3. Lymph node, sentinel, biopsy, Right axillary ?- ONE LYMPH NODE WITH ISOLATED TUMOR CELLS (0/1). ?4. Lymph node, sentinel, biopsy, Right ?- METASTATIC CARCINOMA IN ONE OF ONE LYMPH NODES (1/1). ?5. Lymph node, sentinel, biopsy, Right ?- ONE OF ONE LYMPH NODES NEGATIVE FOR CARCINOMA (0/1). ?6. Lymph node, sentinel, biopsy, Right ?- METASTATIC CARCINOMA IN ONE OF ONE LYMPH NODES (1/1). ?- EXTRACAPSULAR EXTENSION. ?7. Lymph node, sentinel, biopsy, Right ?- METASTATIC CARCINOMA IN ONE OF ONE LYMPH NODES (1/1). ?- EXTRACAPSULAR EXTENSION. ?8. Lymph node, sentinel, biopsy, Right ?- ONE OF ONE LYMPH NODES NEGATIVE FOR CARCINOMA (0/1). ?9. Lymph node, sentinel, biopsy, Right ?- ONE OF ONE LYMPH NODES NEGATIVE FOR CARCINOMA (0/1). ?10. Lymph node, sentinel, biopsy, Right ?- METASTATIC CARCINOMA IN ONE OF ONE LYMPH NODES (1/1). ?- EXTRACAPSULAR EXTENSION. ?  11. Lymph node, biopsy, Right axillary ?- ONE OF ONE LYMPH NODES NEGATIVE FOR CARCINOMA (0/1). ? ?  ?07/22/2017 Receptors her2  ? Ductal carcinoma in primary breast tumor and node metastasis are ER 95%, PR 95% strongly positive, and HER2  + ?The lobular component in primary breast tumor is ER 95% positive, PR 95% positive, strong staining, HER-2 negative. ? ? ?Original biopsy [id number]: Ductal morphology, Her2 FISH positive. ?Current specimen: ?Main f

## 2022-02-23 LAB — CANCER ANTIGEN 27.29: CA 27.29: <9 U/mL (ref 0.0–38.6)

## 2022-02-26 ENCOUNTER — Telehealth: Payer: Self-pay | Admitting: Hematology

## 2022-02-26 NOTE — Telephone Encounter (Signed)
Left message with follow-up appointments per 4/7 los. ?

## 2022-03-22 ENCOUNTER — Other Ambulatory Visit: Payer: Self-pay

## 2022-03-22 ENCOUNTER — Inpatient Hospital Stay: Payer: BC Managed Care – PPO | Attending: Nurse Practitioner

## 2022-03-22 VITALS — BP 108/71 | HR 105 | Temp 98.5°F | Resp 18

## 2022-03-22 DIAGNOSIS — C50411 Malignant neoplasm of upper-outer quadrant of right female breast: Secondary | ICD-10-CM | POA: Insufficient documentation

## 2022-03-22 DIAGNOSIS — Z5111 Encounter for antineoplastic chemotherapy: Secondary | ICD-10-CM | POA: Insufficient documentation

## 2022-03-22 DIAGNOSIS — Z95828 Presence of other vascular implants and grafts: Secondary | ICD-10-CM

## 2022-03-22 DIAGNOSIS — Z170 Estrogen receptor positive status [ER+]: Secondary | ICD-10-CM | POA: Diagnosis not present

## 2022-03-22 MED ORDER — GOSERELIN ACETATE 3.6 MG ~~LOC~~ IMPL
3.6000 mg | DRUG_IMPLANT | Freq: Once | SUBCUTANEOUS | Status: AC
  Administered 2022-03-22: 3.6 mg via SUBCUTANEOUS
  Filled 2022-03-22: qty 3.6

## 2022-03-22 NOTE — Patient Instructions (Signed)
Goserelin injection ?What is this medication? ?GOSERELIN (GOE se rel in) is similar to a hormone found in the body. It lowers the amount of sex hormones that the body makes. Men will have lower testosterone levels and women will have lower estrogen levels while taking this medicine. In men, this medicine is used to treat prostate cancer; the injection is either given once per month or once every 12 weeks. A once per month injection (only) is used to treat women with endometriosis, dysfunctional uterine bleeding, or advanced breast cancer. ?This medicine may be used for other purposes; ask your health care provider or pharmacist if you have questions. ?COMMON BRAND NAME(S): Zoladex, Zoladex 59-Month ?What should I tell my care team before I take this medication? ?They need to know if you have any of these conditions: ?bone problems ?diabetes ?heart disease ?history of irregular heartbeat ?an unusual or allergic reaction to goserelin, other medicines, foods, dyes, or preservatives ?pregnant or trying to get pregnant ?breast-feeding ?How should I use this medication? ?This medicine is for injection under the skin. It is given by a health care professional in a hospital or clinic setting. ?Talk to your pediatrician regarding the use of this medicine in children. Special care may be needed. ?Overdosage: If you think you have taken too much of this medicine contact a poison control center or emergency room at once. ?NOTE: This medicine is only for you. Do not share this medicine with others. ?What if I miss a dose? ?It is important not to miss your dose. Call your doctor or health care professional if you are unable to keep an appointment. ?What may interact with this medication? ?Do not take this medicine with any of the following medications: ?cisapride ?dronedarone ?pimozide ?thioridazine ?This medicine may also interact with the following medications: ?other medicines that prolong the QT interval (an abnormal heart  rhythm) ?This list may not describe all possible interactions. Give your health care provider a list of all the medicines, herbs, non-prescription drugs, or dietary supplements you use. Also tell them if you smoke, drink alcohol, or use illegal drugs. Some items may interact with your medicine. ?What should I watch for while using this medication? ?Visit your doctor or health care provider for regular checks on your progress. Your symptoms may appear to get worse during the first weeks of this therapy. Tell your doctor or healthcare provider if your symptoms do not start to get better or if they get worse after this time. ?Your bones may get weaker if you take this medicine for a long time. If you smoke or frequently drink alcohol you may increase your risk of bone loss. A family history of osteoporosis, chronic use of drugs for seizures (convulsions), or corticosteroids can also increase your risk of bone loss. Talk to your doctor about how to keep your bones strong. ?This medicine should stop regular monthly menstruation in women. Tell your doctor if you continue to menstruate. ?Women should not become pregnant while taking this medicine or for 12 weeks after stopping this medicine. Women should inform their doctor if they wish to become pregnant or think they might be pregnant. There is a potential for serious side effects to an unborn child. Talk to your health care professional or pharmacist for more information. Do not breast-feed an infant while taking this medicine. ?Men should inform their doctors if they wish to father a child. This medicine may lower sperm counts. Talk to your health care professional or pharmacist for more information. ?This  medicine may increase blood sugar. Ask your healthcare provider if changes in diet or medicines are needed if you have diabetes. What side effects may I notice from receiving this medication? Side effects that you should report to your doctor or health care  professional as soon as possible: allergic reactions like skin rash, itching or hives, swelling of the face, lips, or tongue bone pain breathing problems changes in vision chest pain feeling faint or lightheaded, falls fever, chills pain, swelling, warmth in the leg pain, tingling, numbness in the hands or feet signs and symptoms of high blood sugar such as being more thirsty or hungry or having to urinate more than normal. You may also feel very tired or have blurry vision signs and symptoms of low blood pressure like dizziness; feeling faint or lightheaded, falls; unusually weak or tired stomach pain swelling of the ankles, feet, hands trouble passing urine or change in the amount of urine unusually high or low blood pressure unusually weak or tired Side effects that usually do not require medical attention (report to your doctor or health care professional if they continue or are bothersome): change in sex drive or performance changes in breast size in both males and females changes in emotions or moods headache hot flashes irritation at site where injected loss of appetite skin problems like acne, dry skin vaginal dryness This list may not describe all possible side effects. Call your doctor for medical advice about side effects. You may report side effects to FDA at 1-800-FDA-1088. Where should I keep my medication? This drug is given in a hospital or clinic and will not be stored at home. NOTE: This sheet is a summary. It may not cover all possible information. If you have questions about this medicine, talk to your doctor, pharmacist, or health care provider.  2023 [employer]vier/Gold Standard (2019-03-05 00:00:00)  

## 2022-03-28 ENCOUNTER — Other Ambulatory Visit: Payer: Self-pay | Admitting: Hematology

## 2022-04-19 ENCOUNTER — Other Ambulatory Visit: Payer: Self-pay

## 2022-04-19 ENCOUNTER — Inpatient Hospital Stay: Payer: BC Managed Care – PPO | Attending: Nurse Practitioner

## 2022-04-19 VITALS — BP 113/73 | HR 98 | Temp 97.7°F | Resp 18

## 2022-04-19 DIAGNOSIS — Z5111 Encounter for antineoplastic chemotherapy: Secondary | ICD-10-CM | POA: Diagnosis not present

## 2022-04-19 DIAGNOSIS — Z95828 Presence of other vascular implants and grafts: Secondary | ICD-10-CM

## 2022-04-19 DIAGNOSIS — C50411 Malignant neoplasm of upper-outer quadrant of right female breast: Secondary | ICD-10-CM | POA: Insufficient documentation

## 2022-04-19 DIAGNOSIS — Z170 Estrogen receptor positive status [ER+]: Secondary | ICD-10-CM | POA: Diagnosis not present

## 2022-04-19 MED ORDER — GOSERELIN ACETATE 3.6 MG ~~LOC~~ IMPL
3.6000 mg | DRUG_IMPLANT | Freq: Once | SUBCUTANEOUS | Status: AC
  Administered 2022-04-19: 3.6 mg via SUBCUTANEOUS
  Filled 2022-04-19: qty 3.6

## 2022-04-19 NOTE — Patient Instructions (Signed)
Goserelin injection What is this medication? GOSERELIN (GOE se rel in) is similar to a hormone found in the body. It lowers the amount of sex hormones that the body makes. Men will have lower testosterone levels and women will have lower estrogen levels while taking this medicine. In men, this medicine is used to treat prostate cancer; the injection is either given once per month or once every 12 weeks. A once per month injection (only) is used to treat women with endometriosis, dysfunctional uterine bleeding, or advanced breast cancer. This medicine may be used for other purposes; ask your health care provider or pharmacist if you have questions. COMMON BRAND NAME(S): Zoladex, Zoladex 37-Month What should I tell my care team before I take this medication? They need to know if you have any of these conditions: bone problems diabetes heart disease history of irregular heartbeat an unusual or allergic reaction to goserelin, other medicines, foods, dyes, or preservatives pregnant or trying to get pregnant breast-feeding How should I use this medication? This medicine is for injection under the skin. It is given by a health care professional in a hospital or clinic setting. Talk to your pediatrician regarding the use of this medicine in children. Special care may be needed. Overdosage: If you think you have taken too much of this medicine contact a poison control center or emergency room at once. NOTE: This medicine is only for you. Do not share this medicine with others. What if I miss a dose? It is important not to miss your dose. Call your doctor or health care professional if you are unable to keep an appointment. What may interact with this medication? Do not take this medicine with any of the following medications: cisapride dronedarone pimozide thioridazine This medicine may also interact with the following medications: other medicines that prolong the QT interval (an abnormal heart  rhythm) This list may not describe all possible interactions. Give your health care provider a list of all the medicines, herbs, non-prescription drugs, or dietary supplements you use. Also tell them if you smoke, drink alcohol, or use illegal drugs. Some items may interact with your medicine. What should I watch for while using this medication? Visit your doctor or health care provider for regular checks on your progress. Your symptoms may appear to get worse during the first weeks of this therapy. Tell your doctor or healthcare provider if your symptoms do not start to get better or if they get worse after this time. Your bones may get weaker if you take this medicine for a long time. If you smoke or frequently drink alcohol you may increase your risk of bone loss. A family history of osteoporosis, chronic use of drugs for seizures (convulsions), or corticosteroids can also increase your risk of bone loss. Talk to your doctor about how to keep your bones strong. This medicine should stop regular monthly menstruation in women. Tell your doctor if you continue to menstruate. Women should not become pregnant while taking this medicine or for 12 weeks after stopping this medicine. Women should inform their doctor if they wish to become pregnant or think they might be pregnant. There is a potential for serious side effects to an unborn child. Talk to your health care professional or pharmacist for more information. Do not breast-feed an infant while taking this medicine. Men should inform their doctors if they wish to father a child. This medicine may lower sperm counts. Talk to your health care professional or pharmacist for more information. This  medicine may increase blood sugar. Ask your healthcare provider if changes in diet or medicines are needed if you have diabetes. What side effects may I notice from receiving this medication? Side effects that you should report to your doctor or health care  professional as soon as possible: allergic reactions like skin rash, itching or hives, swelling of the face, lips, or tongue bone pain breathing problems changes in vision chest pain feeling faint or lightheaded, falls fever, chills pain, swelling, warmth in the leg pain, tingling, numbness in the hands or feet signs and symptoms of high blood sugar such as being more thirsty or hungry or having to urinate more than normal. You may also feel very tired or have blurry vision signs and symptoms of low blood pressure like dizziness; feeling faint or lightheaded, falls; unusually weak or tired stomach pain swelling of the ankles, feet, hands trouble passing urine or change in the amount of urine unusually high or low blood pressure unusually weak or tired Side effects that usually do not require medical attention (report to your doctor or health care professional if they continue or are bothersome): change in sex drive or performance changes in breast size in both males and females changes in emotions or moods headache hot flashes irritation at site where injected loss of appetite skin problems like acne, dry skin vaginal dryness This list may not describe all possible side effects. Call your doctor for medical advice about side effects. You may report side effects to FDA at 1-800-FDA-1088. Where should I keep my medication? This drug is given in a hospital or clinic and will not be stored at home. NOTE: This sheet is a summary. It may not cover all possible information. If you have questions about this medicine, talk to your doctor, pharmacist, or health care provider.  2023 [employer] Standard (2019-03-05 00:00:00)  

## 2022-04-27 ENCOUNTER — Other Ambulatory Visit: Payer: Self-pay | Admitting: Hematology

## 2022-04-28 ENCOUNTER — Encounter: Payer: Self-pay | Admitting: Hematology

## 2022-05-17 ENCOUNTER — Inpatient Hospital Stay: Payer: BC Managed Care – PPO

## 2022-05-17 ENCOUNTER — Other Ambulatory Visit: Payer: Self-pay

## 2022-05-17 VITALS — BP 96/64 | HR 104 | Temp 99.2°F | Resp 18

## 2022-05-17 DIAGNOSIS — Z5111 Encounter for antineoplastic chemotherapy: Secondary | ICD-10-CM | POA: Diagnosis not present

## 2022-05-17 DIAGNOSIS — Z170 Estrogen receptor positive status [ER+]: Secondary | ICD-10-CM | POA: Diagnosis not present

## 2022-05-17 DIAGNOSIS — Z95828 Presence of other vascular implants and grafts: Secondary | ICD-10-CM

## 2022-05-17 DIAGNOSIS — C50411 Malignant neoplasm of upper-outer quadrant of right female breast: Secondary | ICD-10-CM

## 2022-05-17 MED ORDER — GOSERELIN ACETATE 3.6 MG ~~LOC~~ IMPL
3.6000 mg | DRUG_IMPLANT | Freq: Once | SUBCUTANEOUS | Status: AC
  Administered 2022-05-17: 3.6 mg via SUBCUTANEOUS
  Filled 2022-05-17: qty 3.6

## 2022-05-17 NOTE — Patient Instructions (Signed)
Goserelin injection What is this medication? GOSERELIN (GOE se rel in) is similar to a hormone found in the body. It lowers the amount of sex hormones that the body makes. Men will have lower testosterone levels and women will have lower estrogen levels while taking this medicine. In men, this medicine is used to treat prostate cancer; the injection is either given once per month or once every 12 weeks. A once per month injection (only) is used to treat women with endometriosis, dysfunctional uterine bleeding, or advanced breast cancer. This medicine may be used for other purposes; ask your health care provider or pharmacist if you have questions. COMMON BRAND NAME(S): Zoladex, Zoladex 37-Month What should I tell my care team before I take this medication? They need to know if you have any of these conditions: bone problems diabetes heart disease history of irregular heartbeat an unusual or allergic reaction to goserelin, other medicines, foods, dyes, or preservatives pregnant or trying to get pregnant breast-feeding How should I use this medication? This medicine is for injection under the skin. It is given by a health care professional in a hospital or clinic setting. Talk to your pediatrician regarding the use of this medicine in children. Special care may be needed. Overdosage: If you think you have taken too much of this medicine contact a poison control center or emergency room at once. NOTE: This medicine is only for you. Do not share this medicine with others. What if I miss a dose? It is important not to miss your dose. Call your doctor or health care professional if you are unable to keep an appointment. What may interact with this medication? Do not take this medicine with any of the following medications: cisapride dronedarone pimozide thioridazine This medicine may also interact with the following medications: other medicines that prolong the QT interval (an abnormal heart  rhythm) This list may not describe all possible interactions. Give your health care provider a list of all the medicines, herbs, non-prescription drugs, or dietary supplements you use. Also tell them if you smoke, drink alcohol, or use illegal drugs. Some items may interact with your medicine. What should I watch for while using this medication? Visit your doctor or health care provider for regular checks on your progress. Your symptoms may appear to get worse during the first weeks of this therapy. Tell your doctor or healthcare provider if your symptoms do not start to get better or if they get worse after this time. Your bones may get weaker if you take this medicine for a long time. If you smoke or frequently drink alcohol you may increase your risk of bone loss. A family history of osteoporosis, chronic use of drugs for seizures (convulsions), or corticosteroids can also increase your risk of bone loss. Talk to your doctor about how to keep your bones strong. This medicine should stop regular monthly menstruation in women. Tell your doctor if you continue to menstruate. Women should not become pregnant while taking this medicine or for 12 weeks after stopping this medicine. Women should inform their doctor if they wish to become pregnant or think they might be pregnant. There is a potential for serious side effects to an unborn child. Talk to your health care professional or pharmacist for more information. Do not breast-feed an infant while taking this medicine. Men should inform their doctors if they wish to father a child. This medicine may lower sperm counts. Talk to your health care professional or pharmacist for more information. This  medicine may increase blood sugar. Ask your healthcare provider if changes in diet or medicines are needed if you have diabetes. What side effects may I notice from receiving this medication? Side effects that you should report to your doctor or health care  professional as soon as possible: allergic reactions like skin rash, itching or hives, swelling of the face, lips, or tongue bone pain breathing problems changes in vision chest pain feeling faint or lightheaded, falls fever, chills pain, swelling, warmth in the leg pain, tingling, numbness in the hands or feet signs and symptoms of high blood sugar such as being more thirsty or hungry or having to urinate more than normal. You may also feel very tired or have blurry vision signs and symptoms of low blood pressure like dizziness; feeling faint or lightheaded, falls; unusually weak or tired stomach pain swelling of the ankles, feet, hands trouble passing urine or change in the amount of urine unusually high or low blood pressure unusually weak or tired Side effects that usually do not require medical attention (report to your doctor or health care professional if they continue or are bothersome): change in sex drive or performance changes in breast size in both males and females changes in emotions or moods headache hot flashes irritation at site where injected loss of appetite skin problems like acne, dry skin vaginal dryness This list may not describe all possible side effects. Call your doctor for medical advice about side effects. You may report side effects to FDA at 1-800-FDA-1088. Where should I keep my medication? This drug is given in a hospital or clinic and will not be stored at home. NOTE: This sheet is a summary. It may not cover all possible information. If you have questions about this medicine, talk to your doctor, pharmacist, or health care provider.  2023 [employer] Standard (2019-03-05 00:00:00)

## 2022-05-27 ENCOUNTER — Other Ambulatory Visit: Payer: Self-pay | Admitting: Nurse Practitioner

## 2022-06-14 ENCOUNTER — Other Ambulatory Visit: Payer: Self-pay

## 2022-06-14 ENCOUNTER — Inpatient Hospital Stay: Payer: BC Managed Care – PPO | Attending: Nurse Practitioner

## 2022-06-14 VITALS — BP 100/69 | HR 99 | Temp 99.2°F | Resp 16

## 2022-06-14 DIAGNOSIS — Z5111 Encounter for antineoplastic chemotherapy: Secondary | ICD-10-CM | POA: Insufficient documentation

## 2022-06-14 DIAGNOSIS — Z170 Estrogen receptor positive status [ER+]: Secondary | ICD-10-CM | POA: Insufficient documentation

## 2022-06-14 DIAGNOSIS — C50411 Malignant neoplasm of upper-outer quadrant of right female breast: Secondary | ICD-10-CM | POA: Insufficient documentation

## 2022-06-14 DIAGNOSIS — Z95828 Presence of other vascular implants and grafts: Secondary | ICD-10-CM

## 2022-06-14 MED ORDER — GOSERELIN ACETATE 3.6 MG ~~LOC~~ IMPL
3.6000 mg | DRUG_IMPLANT | Freq: Once | SUBCUTANEOUS | Status: AC
  Administered 2022-06-14: 3.6 mg via SUBCUTANEOUS
  Filled 2022-06-14: qty 3.6

## 2022-06-14 NOTE — Patient Instructions (Signed)
Goserelin injection What is this medication? GOSERELIN (GOE se rel in) is similar to a hormone found in the body. It lowers the amount of sex hormones that the body makes. Men will have lower testosterone levels and women will have lower estrogen levels while taking this medicine. In men, this medicine is used to treat prostate cancer; the injection is either given once per month or once every 12 weeks. A once per month injection (only) is used to treat women with endometriosis, dysfunctional uterine bleeding, or advanced breast cancer. This medicine may be used for other purposes; ask your health care provider or pharmacist if you have questions. COMMON BRAND NAME(S): Zoladex, Zoladex 37-Month What should I tell my care team before I take this medication? They need to know if you have any of these conditions: bone problems diabetes heart disease history of irregular heartbeat an unusual or allergic reaction to goserelin, other medicines, foods, dyes, or preservatives pregnant or trying to get pregnant breast-feeding How should I use this medication? This medicine is for injection under the skin. It is given by a health care professional in a hospital or clinic setting. Talk to your pediatrician regarding the use of this medicine in children. Special care may be needed. Overdosage: If you think you have taken too much of this medicine contact a poison control center or emergency room at once. NOTE: This medicine is only for you. Do not share this medicine with others. What if I miss a dose? It is important not to miss your dose. Call your doctor or health care professional if you are unable to keep an appointment. What may interact with this medication? Do not take this medicine with any of the following medications: cisapride dronedarone pimozide thioridazine This medicine may also interact with the following medications: other medicines that prolong the QT interval (an abnormal heart  rhythm) This list may not describe all possible interactions. Give your health care provider a list of all the medicines, herbs, non-prescription drugs, or dietary supplements you use. Also tell them if you smoke, drink alcohol, or use illegal drugs. Some items may interact with your medicine. What should I watch for while using this medication? Visit your doctor or health care provider for regular checks on your progress. Your symptoms may appear to get worse during the first weeks of this therapy. Tell your doctor or healthcare provider if your symptoms do not start to get better or if they get worse after this time. Your bones may get weaker if you take this medicine for a long time. If you smoke or frequently drink alcohol you may increase your risk of bone loss. A family history of osteoporosis, chronic use of drugs for seizures (convulsions), or corticosteroids can also increase your risk of bone loss. Talk to your doctor about how to keep your bones strong. This medicine should stop regular monthly menstruation in women. Tell your doctor if you continue to menstruate. Women should not become pregnant while taking this medicine or for 12 weeks after stopping this medicine. Women should inform their doctor if they wish to become pregnant or think they might be pregnant. There is a potential for serious side effects to an unborn child. Talk to your health care professional or pharmacist for more information. Do not breast-feed an infant while taking this medicine. Men should inform their doctors if they wish to father a child. This medicine may lower sperm counts. Talk to your health care professional or pharmacist for more information. This  medicine may increase blood sugar. Ask your healthcare provider if changes in diet or medicines are needed if you have diabetes. What side effects may I notice from receiving this medication? Side effects that you should report to your doctor or health care  professional as soon as possible: allergic reactions like skin rash, itching or hives, swelling of the face, lips, or tongue bone pain breathing problems changes in vision chest pain feeling faint or lightheaded, falls fever, chills pain, swelling, warmth in the leg pain, tingling, numbness in the hands or feet signs and symptoms of high blood sugar such as being more thirsty or hungry or having to urinate more than normal. You may also feel very tired or have blurry vision signs and symptoms of low blood pressure like dizziness; feeling faint or lightheaded, falls; unusually weak or tired stomach pain swelling of the ankles, feet, hands trouble passing urine or change in the amount of urine unusually high or low blood pressure unusually weak or tired Side effects that usually do not require medical attention (report to your doctor or health care professional if they continue or are bothersome): change in sex drive or performance changes in breast size in both males and females changes in emotions or moods headache hot flashes irritation at site where injected loss of appetite skin problems like acne, dry skin vaginal dryness This list may not describe all possible side effects. Call your doctor for medical advice about side effects. You may report side effects to FDA at 1-800-FDA-1088. Where should I keep my medication? This drug is given in a hospital or clinic and will not be stored at home. NOTE: This sheet is a summary. It may not cover all possible information. If you have questions about this medicine, talk to your doctor, pharmacist, or health care provider.  2023 [employer] Standard (2019-03-05 00:00:00)

## 2022-06-26 DIAGNOSIS — Z0000 Encounter for general adult medical examination without abnormal findings: Secondary | ICD-10-CM | POA: Diagnosis not present

## 2022-06-26 DIAGNOSIS — R7303 Prediabetes: Secondary | ICD-10-CM | POA: Diagnosis not present

## 2022-06-26 DIAGNOSIS — E039 Hypothyroidism, unspecified: Secondary | ICD-10-CM | POA: Diagnosis not present

## 2022-06-26 DIAGNOSIS — E559 Vitamin D deficiency, unspecified: Secondary | ICD-10-CM | POA: Diagnosis not present

## 2022-07-01 ENCOUNTER — Other Ambulatory Visit: Payer: Self-pay | Admitting: Nurse Practitioner

## 2022-07-12 ENCOUNTER — Inpatient Hospital Stay: Payer: BC Managed Care – PPO | Attending: Nurse Practitioner

## 2022-07-12 VITALS — BP 96/52 | HR 110 | Temp 99.3°F | Resp 18

## 2022-07-12 DIAGNOSIS — Z170 Estrogen receptor positive status [ER+]: Secondary | ICD-10-CM | POA: Diagnosis not present

## 2022-07-12 DIAGNOSIS — C50411 Malignant neoplasm of upper-outer quadrant of right female breast: Secondary | ICD-10-CM | POA: Diagnosis not present

## 2022-07-12 DIAGNOSIS — Z95828 Presence of other vascular implants and grafts: Secondary | ICD-10-CM

## 2022-07-12 DIAGNOSIS — Z5111 Encounter for antineoplastic chemotherapy: Secondary | ICD-10-CM | POA: Insufficient documentation

## 2022-07-12 MED ORDER — GOSERELIN ACETATE 3.6 MG ~~LOC~~ IMPL
3.6000 mg | DRUG_IMPLANT | Freq: Once | SUBCUTANEOUS | Status: AC
  Administered 2022-07-12: 3.6 mg via SUBCUTANEOUS
  Filled 2022-07-12: qty 3.6

## 2022-07-12 NOTE — Patient Instructions (Signed)
Goserelin Implant What is this medication? GOSERELIN (GOE se rel in) treats prostate cancer and breast cancer. It works by decreasing levels of the hormones testosterone and estrogen in the body. This prevents prostate and breast cancer cells from spreading or growing. It may also be used to treat endometriosis. This is a condition where the tissue that lines the uterus grows outside the uterus. It works by decreasing the amount of estrogen your body makes, which reduces heavy bleeding and pain. It can also be used to help thin the lining of the uterus before a surgery used to prevent or reduce heavy periods. This medicine may be used for other purposes; ask your health care provider or pharmacist if you have questions. COMMON BRAND NAME(S): Zoladex, Zoladex 3-Month What should I tell my care team before I take this medication? They need to know if you have any of these conditions: Bone problems Diabetes Heart disease History of irregular heartbeat or rhythm An unusual or allergic reaction to goserelin, other medications, foods, dyes, or preservatives Pregnant or trying to get pregnant Breastfeeding How should I use this medication? This medication is injected under the skin. It is given by your care team in a hospital or clinic setting. Talk to your care team about the use of this medication in children. Special care may be needed. Overdosage: If you think you have taken too much of this medicine contact a poison control center or emergency room at once. NOTE: This medicine is only for you. Do not share this medicine with others. What if I miss a dose? Keep appointments for follow-up doses. It is important not to miss your dose. Call your care team if you are unable to keep an appointment. What may interact with this medication? Do not take this medication with any of the following: Cisapride Dronedarone Pimozide Thioridazine This medication may also interact with the following: Other  medications that cause heart rhythm changes This list may not describe all possible interactions. Give your health care provider a list of all the medicines, herbs, non-prescription drugs, or dietary supplements you use. Also tell them if you smoke, drink alcohol, or use illegal drugs. Some items may interact with your medicine. What should I watch for while using this medication? Visit your care team for regular checks on your progress. Your symptoms may appear to get worse during the first weeks of this therapy. Tell your care team if your symptoms do not start to get better or if they get worse after this time. Using this medication for a long time may weaken your bones. If you smoke or frequently drink alcohol you may increase your risk of bone loss. A family history of osteoporosis, chronic use of medications for seizures (convulsions), or corticosteroids can also increase your risk of bone loss. The risk of bone fractures may be increased. Talk to your care team about your bone health. This medication may increase blood sugar. The risk may be higher in patients who already have diabetes. Ask your care team what you can do to lower your risk of diabetes while taking this medication. This medication should stop regular monthly menstruation in women. Tell your care team if you continue to menstruate. Talk to your care team if you wish to become pregnant or think you might be pregnant. This medication can cause serious birth defects if taken during pregnancy or for 12 weeks after stopping treatment. Talk to your care team about reliable forms of contraception. Do not breastfeed while taking this   medication. This medication may cause infertility. Talk to your care team if you are concerned about your fertility. What side effects may I notice from receiving this medication? Side effects that you should report to your care team as soon as possible: Allergic reactions--skin rash, itching, hives, swelling  of the face, lips, tongue, or throat Change in the amount of urine Heart attack--pain or tightness in the chest, shoulders, arms, or jaw, nausea, shortness of breath, cold or clammy skin, feeling faint or lightheaded Heart rhythm changes--fast or irregular heartbeat, dizziness, feeling faint or lightheaded, chest pain, trouble breathing High blood sugar (hyperglycemia)--increased thirst or amount of urine, unusual weakness or fatigue, blurry vision High calcium level--increased thirst or amount of urine, nausea, vomiting, confusion, unusual weakness or fatigue, bone pain Pain, redness, irritation, or bruising at the injection site Severe back pain, numbness or weakness of the hands, arms, legs, or feet, loss of coordination, loss of bowel or bladder control Stroke--sudden numbness or weakness of the face, arm, or leg, trouble speaking, confusion, trouble walking, loss of balance or coordination, dizziness, severe headache, change in vision Swelling and pain of the tumor site or lymph nodes Trouble passing urine Side effects that usually do not require medical attention (report to your care team if they continue or are bothersome): Change in sex drive or performance Headache Hot flashes Rapid or extreme change in emotion or mood Sweating Swelling of the ankles, hands, or feet Unusual vaginal discharge, itching, or odor This list may not describe all possible side effects. Call your doctor for medical advice about side effects. You may report side effects to FDA at 1-800-FDA-1088. Where should I keep my medication? This medication is given in a hospital or clinic. It will not be stored at home. NOTE: This sheet is a summary. It may not cover all possible information. If you have questions about this medicine, talk to your doctor, pharmacist, or health care provider.  2023 [employer] Standard (2022-03-20 00:00:00)  

## 2022-07-24 ENCOUNTER — Encounter: Payer: Self-pay | Admitting: Hematology

## 2022-07-26 ENCOUNTER — Other Ambulatory Visit: Payer: Self-pay | Admitting: Hematology

## 2022-07-29 ENCOUNTER — Other Ambulatory Visit: Payer: Self-pay | Admitting: Gynecologic Oncology

## 2022-07-29 ENCOUNTER — Other Ambulatory Visit: Payer: Self-pay

## 2022-07-29 DIAGNOSIS — Z170 Estrogen receptor positive status [ER+]: Secondary | ICD-10-CM

## 2022-07-31 ENCOUNTER — Telehealth: Payer: Self-pay | Admitting: *Deleted

## 2022-07-31 NOTE — Telephone Encounter (Signed)
Attempted to return patient's call .Marland Kitchen Left message

## 2022-07-31 NOTE — Telephone Encounter (Signed)
Called patient and left message to setup new patient appt with Dr Berline Lopes for 10/10.

## 2022-07-31 NOTE — Telephone Encounter (Signed)
Spoke with the patient regarding the referral to GYN oncology. Patient scheduled as new patient with Dr Berline Lopes on 10/10 @ 9:00am. Patient given an arrival time of 8:30am.  Explained to the patient the the doctor will perform a pelvic exam at this visit. Patient given the policy that no visitors under the 16 yrs are allowed in the [place]. Patient given the address/phone number for the clinic and that the center offers free valet service.

## 2022-08-02 ENCOUNTER — Telehealth: Payer: Self-pay | Admitting: *Deleted

## 2022-08-02 NOTE — Telephone Encounter (Signed)
Called and spoke with the patient, moved appt from 10/10 to 9/25

## 2022-08-07 ENCOUNTER — Telehealth: Payer: Self-pay

## 2022-08-07 NOTE — Telephone Encounter (Signed)
Called and left message for patient to call our office.  There is a scheduling conflict for the day of her appointment and wanted to see if she could switch time from 9:45am to 10:30am.

## 2022-08-07 NOTE — Telephone Encounter (Signed)
Returned patients call and had appointment rescheduled from 9:45am to 10:30am.  Patient confirmed and understood.

## 2022-08-09 ENCOUNTER — Inpatient Hospital Stay: Payer: BC Managed Care – PPO

## 2022-08-09 ENCOUNTER — Other Ambulatory Visit: Payer: Self-pay

## 2022-08-09 ENCOUNTER — Inpatient Hospital Stay: Payer: BC Managed Care – PPO | Attending: Nurse Practitioner | Admitting: Hematology

## 2022-08-09 ENCOUNTER — Encounter: Payer: Self-pay | Admitting: Psychiatry

## 2022-08-09 VITALS — BP 106/72 | HR 96 | Temp 98.5°F | Resp 18 | Ht 66.0 in | Wt 162.5 lb

## 2022-08-09 DIAGNOSIS — Z170 Estrogen receptor positive status [ER+]: Secondary | ICD-10-CM | POA: Diagnosis not present

## 2022-08-09 DIAGNOSIS — Z79811 Long term (current) use of aromatase inhibitors: Secondary | ICD-10-CM | POA: Insufficient documentation

## 2022-08-09 DIAGNOSIS — Z95828 Presence of other vascular implants and grafts: Secondary | ICD-10-CM

## 2022-08-09 DIAGNOSIS — C50411 Malignant neoplasm of upper-outer quadrant of right female breast: Secondary | ICD-10-CM | POA: Insufficient documentation

## 2022-08-09 DIAGNOSIS — Z5111 Encounter for antineoplastic chemotherapy: Secondary | ICD-10-CM | POA: Diagnosis not present

## 2022-08-09 LAB — COMPREHENSIVE METABOLIC PANEL
ALT: 13 U/L (ref 0–44)
AST: 14 U/L — ABNORMAL LOW (ref 15–41)
Albumin: 4.6 g/dL (ref 3.5–5.0)
Alkaline Phosphatase: 114 U/L (ref 38–126)
Anion gap: 5 (ref 5–15)
BUN: 9 mg/dL (ref 6–20)
CO2: 30 mmol/L (ref 22–32)
Calcium: 9.6 mg/dL (ref 8.9–10.3)
Chloride: 106 mmol/L (ref 98–111)
Creatinine, Ser: 1.1 mg/dL — ABNORMAL HIGH (ref 0.44–1.00)
GFR, Estimated: >60 mL/min (ref >60)
Glucose, Bld: 109 mg/dL — ABNORMAL HIGH (ref 70–99)
Potassium: 4.3 mmol/L (ref 3.5–5.1)
Sodium: 141 mmol/L (ref 135–145)
Total Bilirubin: 0.4 mg/dL (ref 0.3–1.2)
Total Protein: 7.5 g/dL (ref 6.5–8.1)

## 2022-08-09 LAB — CBC WITH DIFFERENTIAL/PLATELET
Abs Immature Granulocytes: 0.02 10*3/uL (ref 0.00–0.07)
Basophils Absolute: 0.1 10*3/uL (ref 0.0–0.1)
Basophils Relative: 1 %
Eosinophils Absolute: 0.2 10*3/uL (ref 0.0–0.5)
Eosinophils Relative: 4 %
HCT: 38.1 % (ref 36.0–46.0)
Hemoglobin: 12.7 g/dL (ref 12.0–15.0)
Immature Granulocytes: 0 %
Lymphocytes Relative: 30 %
Lymphs Abs: 1.8 10*3/uL (ref 0.7–4.0)
MCH: 29.2 pg (ref 26.0–34.0)
MCHC: 33.3 g/dL (ref 30.0–36.0)
MCV: 87.6 fL (ref 80.0–100.0)
Monocytes Absolute: 0.5 10*3/uL (ref 0.1–1.0)
Monocytes Relative: 8 %
Neutro Abs: 3.4 10*3/uL (ref 1.7–7.7)
Neutrophils Relative %: 57 %
Platelets: 241 10*3/uL (ref 150–400)
RBC: 4.35 MIL/uL (ref 3.87–5.11)
RDW: 13.2 % (ref 11.5–15.5)
WBC: 5.9 10*3/uL (ref 4.0–10.5)
nRBC: 0 % (ref 0.0–0.2)

## 2022-08-09 MED ORDER — GOSERELIN ACETATE 3.6 MG ~~LOC~~ IMPL
3.6000 mg | DRUG_IMPLANT | Freq: Once | SUBCUTANEOUS | Status: AC
  Administered 2022-08-09: 3.6 mg via SUBCUTANEOUS
  Filled 2022-08-09: qty 3.6

## 2022-08-09 NOTE — Progress Notes (Signed)
[place]   [phone] Fax:([phone]) [phone]   Clinic Follow up Note   Patient Care Team: Kristen Loader, FNP as PCP - General (Family Medicine) [name] (Family Medicine) [name], MD as Consulting Physician (General Surgery) [name], MD as Consulting Physician (Hematology) Kyung Rudd, MD as Consulting Physician (Radiation Oncology)  Date of Service:  08/09/2022  CHIEF COMPLAINT: f/u of right breast cancer  CURRENT THERAPY:  -Monthly Zoladex injections starting 03/20/18 -antiestrogen therapy since 05/03/18, currently exemestane since 04/2019  ASSESSMENT & PLAN:  Sheila [name] is a 46 y.o. pre-menopausal female with   1. Malignant neoplasm of upper-outer quadrant of right breast in female, mixed invasive ductal and lobular carcinoma, pT3(m)N2aM0, stage 1b, ductal carcinoma triple positive, lobular carcinoma ER+/PR+/HER2-,  Grade 2 -diagnosed in 05/2017. S/p b/l mastectomy, adjuvant TCHP, radiation, maintenance Herceptin/Perjeta, and 1 year of Neratinib.  -She started adjuvant letrozole with monthly Zoladex injections in 04/2018.  Letrozole was subsequently changed to anastrozole, then exemestane due to some joint and back pain. She tolerates exemestane well with mild hot flashes, tolerable.  -she is scheduled to meet Dr. Ernestina Patches in gyn onc on Monday, 9/25, to discuss BSO. -She is clinically doing very well.  Lab reviewed, all WNL. Physical exam was unremarkable, there is no clinical concern for recurrence. -f/u in 6 months    2. Hot flash  -Manageable. Continue Effexor 251m daily.  -Will monitor on exemestane    3. Bone Health  -Continue Vitamin D supplement and weight bearing exercise.  -Her 08/2020 baseline DEXA was normal with lowest T-score -0.6 at left hip.     4. Back pain, knee pain  -Her CT Chest on 04/06/18 showed sclerotic lesions of spine in T3 and L1, but her bone scan in 07/2018 was negative -MRI lumbar from 01/28/19  shows indeterminate lesions of L1 and L2. -She takes tylenol for pain. Manageable on AI     PLAN:  -Proceed with Zoladex today and continue monthly  -She plans to have BSO before the end of this year  -Continue Exemestane  -consult with GYN Dr. NErnestina Patches9/25 to discuss BSO -lab and f/u in 6 months   No problem-specific Assessment & Plan notes found for this encounter.   SUMMARY OF ONCOLOGIC HISTORY: Oncology History Overview Note   Cancer Staging Malignant neoplasm of upper-outer quadrant of right breast in female, estrogen receptor positive ([place] Staging form: Breast, AJCC 8th Edition - Clinical stage from 06/11/2017: Stage IB (cT3, cN0, cM0, G2, ER: Positive, PR: Positive, HER2: Positive) - Signed by FTruitt Merle MD on 06/16/2017 - Pathologic stage from 07/22/2017: Stage IB (pT3(m), pN2a(sn), cM0, G2, ER: Positive, PR: Positive, HER2: Positive) - Signed by BAlla Feeling NP on 08/04/2017    Malignant neoplasm of upper-outer quadrant of right breast in female, estrogen receptor positive ([place]  06/11/2017 Initial Biopsy   Diagnosis 06/11/17 Breast, right, needle core biopsy, 11:00 o'clock - INVASIVE DUCTAL CARCINOMA, G2   06/11/2017 Mammogram   [place] Diagnostic Breast Tomo Bilateral 06/11/17 IMPRESSION: 1. Highly suspicious mass within the retroareolar right breast, extending from the 8:00 to 11:00 axes, anterior to posterior depth, extending anteriorly to the nipple with associated nipple retraction, measuring at least 5.5 cm by ultrasound   2. No sonographic evidence of metastatic lymphadenopathy in the right axilla.  3. No evidence of malignancy within the left breast.    06/11/2017 Initial Diagnosis   Malignant neoplasm of upper-outer quadrant of right breast in female,  estrogen receptor positive ([place])   06/11/2017 Receptors her2   Estrogen Receptor: 100%, POSITIVE, STRONG STAINING INTENSITY Progesterone Receptor: 100%, POSITIVE, STRONG STAINING INTENSITY Proliferation Marker  Ki67: 12%Proliferation Marker Ki67: 12%  HER2 - **POSITIVE** RATIO OF HER2/CEP17 SIGNALS 2.55 AVERAGE HER2 COPY NUMBER PER CELL 4.85   06/25/2017 Genetic Testing   Negative genetic testing on the 9 gene STAT panel.  The STAT Breast cancer panel offered by [facility] includes sequencing and rearrangement analysis for the following 9 genes:  ATM, BRCA1, BRCA2, CDH1, CHEK2, PALB2, PTEN, STK11 and TP53.   The report date is June 25, 2017.  Negative genetic testing on the common hereditary cancer panel.  The Hereditary Gene Panel offered by [facility] includes sequencing and/or deletion duplication testing of the following 46 genes: APC, ATM, AXIN2, BARD1, BMPR1A, BRCA1, BRCA2, BRIP1, CDH1, CDKN2A (p14ARF), CDKN2A (p16INK4a), CHEK2, CTNNA1, DICER1, EPCAM (Deletion/duplication testing only), GREM1 (promoter region deletion/duplication testing only), KIT, MEN1, MLH1, MSH2, MSH3, MSH6, MUTYH, NBN, NF1, NHTL1, PALB2, PDGFRA, PMS2, POLD1, POLE, PTEN, RAD50, RAD51C, RAD51D, SDHB, SDHC, SDHD, SMAD4, SMARCA4. STK11, TP53, TSC1, TSC2, and VHL.  The following genes were evaluated for sequence changes only: SDHA and HOXB13 c.251G>A variant only.  The report date is June 25, 2017.    07/02/2017 Imaging   CT cAP 07/02/17 IMPRESSION: 1. Subareolar right breast mass. No compelling findings of nodal or metastatic involvement. 2. Old granulomatous disease. 3. There is a 7 mm enhancing or hyperdense focus posteriorly in segment 7 of the liver in the subcapsular region. By virtue of its small size this lesion is technically nonspecific although statistically likely to be a small benign lesions such as flash filling hemangioma. This may warrant surveillance. 4.  Prominent stool throughout the colon favors constipation. 5. Degenerative disc disease and spondylosis at L5-S1 likely causing mild impingement.   07/02/2017 Imaging   Bone scan 07/02/17 IMPRESSION: Today' s exam is negative. I ascribed the tiny focus of  activity just proximal to the left antecubital region to injection site.   07/22/2017 Surgery   RIGHT TOTAL MASTECTOMY WITH RIGHT AXILLARY SENTINEL LYMPH NODE BIOPSY, LEFT PROPHYLACTIC MASTECTOM and INSERTION PORT-A-CATH WITH [name] by [name] and [facility]    07/22/2017 Pathology Results   Diagnosis 1. Breast, simple mastectomy, Left - FIBROADENOMA. - NO MALIGNANCY IDENTIFIED. 2. 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 - ADDITIONAL FOCUS OF LOBULAR CARCINOMA, GRADE 2, SPANNING 2.2 CM. - INTERMEDIATE GRADE DUCTAL CARCINOMA IN SITU. - INVASIVE CARCINOMA COMES TO WITHIN 0.2 TO 0.3 CM OF THE DEEP MARGIN, FOCALLY. - TUMOR FOCALLY INVOLVES EPIDERMIS. - LYMPHOVASCULAR INVASION OF DERMAL LYMPHATICS. - ONE OF ONE LYMPH NODES NEGATIVE FOR CARCINOMA (0/1). - SEE ONCOLOGY TABLE. 3. Lymph node, sentinel, biopsy, Right axillary - ONE LYMPH NODE WITH ISOLATED TUMOR CELLS (0/1). 4. Lymph node, sentinel, biopsy, Right - METASTATIC CARCINOMA IN ONE OF ONE LYMPH NODES (1/1). 5. Lymph node, sentinel, biopsy, Right - ONE OF ONE LYMPH NODES NEGATIVE FOR CARCINOMA (0/1). 6. Lymph node, sentinel, biopsy, Right - METASTATIC CARCINOMA IN ONE OF ONE LYMPH NODES (1/1). - EXTRACAPSULAR EXTENSION. 7. Lymph node, sentinel, biopsy, Right - METASTATIC CARCINOMA IN ONE OF ONE LYMPH NODES (1/1). - EXTRACAPSULAR EXTENSION. 8. Lymph node, sentinel, biopsy, Right - ONE OF ONE LYMPH NODES NEGATIVE FOR CARCINOMA (0/1). 9. Lymph node, sentinel, biopsy, Right - ONE OF ONE LYMPH NODES NEGATIVE FOR CARCINOMA (0/1). 10. Lymph node, sentinel, biopsy, Right - METASTATIC CARCINOMA IN ONE OF ONE  LYMPH NODES (1/1). - EXTRACAPSULAR EXTENSION. 11. Lymph node, biopsy, Right axillary - ONE OF ONE LYMPH NODES NEGATIVE FOR CARCINOMA (0/1).    07/22/2017 Receptors her2   Ductal carcinoma in primary breast tumor and node metastasis are ER 95%, PR 95% strongly  positive, and HER2 + The lobular component in primary breast tumor is ER 95% positive, PR 95% positive, strong staining, HER-2 negative.   Original biopsy [id number]: Ductal morphology, Her2 FISH positive. Current specimen: Main focus is 75% ductal morphology, 25% lobular morphology. Smaller focus is lobular morphology. #2A Lobular morphology, Her IHC negative. #2E Lobular morphology, Her2 FISH negative, Her2 IHC negative. #4 50% lobular and 50% ductal morphology, Her2 FISH positive, Her2 IHC negative. #6 Ductal morphology #7 Ductal morphology #10 50% lobular, 50% ductal morphology   08/25/2017 Imaging   MRI abdomen done at [facility]:  Enhancing structure measing 5 mm is compatible with a flash filling hemangioma. No specific findings to suggest metastatic cancer   08/25/2017 Imaging   MRI Abdomen at [facility] 08/25/17  IMPRESSION: 1. Lesion in segment 7 of the liver has signal and enhancement characteristics compatible with a flash filling hemangioma. No specific findings identified to suggest metastatic disease.   08/29/2017 - 12/12/2017 Chemotherapy   adjuvant TCHP every 3 weeks for 6 cycles starting 08/29/17. Postponed and decreased Carbo to 660m and added Onpro with cycle 2 on 09/19/17 due to neutropenic fever. Carbplatin further reduced to 5034mstarting with cycle 3 due to thrombocytopenia. Herceptin was held for Cycle 4 and 5 due to worsening global longitudinal strain found on ECHO from 11/07/17. Completed 6 cycles on 12/12/17      09/04/2017 - 09/09/2017 Hospital Admission   Admit date: 09/04/2017 Discharge date: 09/09/2017   DISCHARGE DIAGNOSES:  Principal Problem:   Sepsis ([facility]:   Malignant neoplasm of upper-outer quadrant of right breast in female, estrogen receptor positive ([facility]  Port-A-Cath in place   Neutropenic fever ([facility]  Hyponatremia   Malignant neoplasm of breast in female, estrogen receptor positive ([facility]    12/28/2017 Imaging   CT CHEST  IMPRESSION: 1. 2.3 x 2.7 x 1.2 cm irregular masslike opacity within the right middle lobe worrisome for malignancy/metastasis. 2. 3 new very small sclerotic lesions, within the sternum and the body of T4, worrisome for new metastases.   12/28/2017 - 01/05/2018 Hospital Admission   Admit date: 12/28/17-01/05/18 Admission diagnosis: multifocal pneumonia, influenza A, sepsis, hypoxia Additional comments: imaging during hospitalization notable for new irregular masslike opacity in the right middle lobe worrisome for metastasis and new very small sclerotic lesion within the sternum and the body of T4, worrisome for metastasis   12/29/2017 Imaging   CT ABD/PELVIS IMPRESSION: No definite acute intra-abdominal or intrapelvic abnormalities.   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   12/30/2017 Imaging   CT HEAD IMPRESSION: No acute intracranial abnormality or enhancing lesion.   01/16/2018 - 01/22/2019 Chemotherapy   Maintence Herceptin and Perjeta every 3 weeks for 6-12 months starting 01/16/18. Held 03/2018-07/17/18 due to low EF. Completed on 01/22/19    02/04/2018 PET scan   IMPRESSION: 1. The previous consolidation in the right middle lobe currently has a more nodular and reticular appearance with some associated mild atelectasis along its anterior margin. This demonstrates low-grade metabolic activity with maximum SUV 3.2 (background blood pool activity is 2.7). Although conceivably related to low-grade residuum from the patient's malignancy, the presence of calcified right hilar lymph nodes and small calcifications  along the nodularity raise suspicion for a low-grade active granulomatous process or atypical infectious process. Surveillance is recommended. 2. Scattered deposits of hypermetabolic but benign metabolically active brown fat.   02/23/2018 - 04/09/2018 Radiation Therapy   Radiation treatment dates:   02/23/2018 - 04/09/2018   Site/dose:   The  patient initially received a dose of 50.4 Gy in 28 fractions to the right chest wall and supraclavicular region. This was delivered using a 3-D conformal, 4 field technique. The patient then received a boost to the mastectomy scar. This delivered an additional 10 Gy in 5 fractions using an en face electron field. The total dose was 60.4 Gy.   Narrative: The patient tolerated radiation treatment relatively well.   The patient had some expected skin irritation as she progressed during treatment. Moist desquamation was not present at the end of treatment.   03/20/2018 -  Anti-estrogen oral therapy   -Monthly Zoladex injection starting 03/20/18  -Letrozole 2.37m once dialy starting 05/03/18  -Switched to Anastrozole starting 07/2018 Due to her worsening back pain. -Changed to Exemestane in 04/2019 due to back pain exacerbation.    04/06/2018 Imaging   CT Chest wo contrast IMPRESSION: 1. Stable linear nodular thickening in the RIGHT middle lobe is favored post infectious or inflammatory scarring. 2. Calcified RIGHT hilar lymph node is consistent post infectious process. 3. Stable small LEFT upper lobe nodule. 4. Stable sclerotic lesion in the manubrium. Sclerotic lesion at T3 is more prominent (4 mm). Sclerotic lesion at L 1 is new from 07/02/2017.   04/06/2018 Imaging   04/06/2018 CT Chest IMPRESSION: 1. Stable linear nodular thickening in the RIGHT middle lobe is favored post infectious or inflammatory scarring. 2. Calcified RIGHT hilar lymph node is consistent post infectious process. 3. Stable small LEFT upper lobe nodule. 4. Stable sclerotic lesion in the manubrium. Sclerotic lesion at T3 is more prominent (4 mm). Sclerotic lesion at L 1 is new from 07/02/2017.   04/10/2018 Echocardiogram   LV EF 45%-50%   05/25/2018 Echocardiogram   LV EF 45%-50%   07/13/2018 Echocardiogram   Normal LV size with EF 55-60%   07/31/2018 Imaging   07/31/2018 Bone Scan IMPRESSION: No evidence of skeletal  metastatic disease.   07/31/2018 Imaging   07/31/2018 CT CAP IMPRESSION: Residual linear and nodular peribronchovascular opacities in the right middle lobe, with other scattered areas of ground-glass opacities in the right middle, right upper and right lower lobes, which are located within the previously demonstrated bulky airspace consolidation. The findings are improved from the chest CT dated 12/30/2017, and relatively stable from PET-CT dated 02/04/2018. These may represent post infectious/post inflammatory changes or residual malignancy.   Stable partially calcified right hilar lymphadenopathy.   Interval development of nodular soft tissue thickening within the anterior mediastinum in the area of the thymus, new from chest CT dated 12/30/2017. This may represent reactive thymic tissue, thymoma or lymphadenopathy.   1.8 cm probably physiologic left ovarian cyst. Borderline thickening of the endometrium measuring 15.6 mm. Further evaluation with pelvic ultrasound may be considered.   No new or suspicious findings within the abdomen.   09/15/2018 Echocardiogram   EF 55-60%   01/28/2019 Imaging   MRI Lumbar spie 01/28/19  IMPRESSION: 1. Subcentimeter lesions in the L1 and L2 vertebral bodies, indeterminate for metastases. 2. Mild lower lumbar disc degeneration without stenosis.   01/28/2019 Imaging   CT chest 01/28/19  IMPRESSION: Right lung post radiation changes. No active lung disease or other acute findings.   No  evidence of recurrent or metastatic carcinoma within the thorax.   03/15/2019 - 02/2020 Chemotherapy   Anti-HER2 Neratinib 240 mg daily, starting 03/15/19. She completed 1 year of treatment in 02/2020      INTERVAL HISTORY:  Sheila [name] is here for a follow up of breast cancer. She was last seen by me on 02/22/22. She presents to the clinic alone.  She is clinically doing well, denies any pain, or other discomfort.  Tolerating exemestane well.    All  other systems were reviewed with the patient and are negative.  MEDICAL HISTORY:  Past Medical History:  Diagnosis Date   Anxiety    Cancer ([place])    right breast    Cardiomyopathy ([place])    Family history of breast cancer    Family history of colon cancer    GERD (gastroesophageal reflux disease)    Headache    couple of migraines in the past   History of kidney stones    Vitamin D deficiency     SURGICAL HISTORY: Past Surgical History:  Procedure Laterality Date   egg donation     kidney stone removal     MASS EXCISION Right 08/19/2019   Procedure: REMOVE EXCESS TISSUE FROM RIGHT LATERAL MASTECTOMY SCAR;  Surgeon: [name], MD;  Location: [place];  Service: General;  Laterality: Right;   MASTECTOMY W/ SENTINEL NODE BIOPSY Bilateral 07/22/2017   Procedure: RIGHT TOTAL MASTECTOMY WITH RIGHT AXILLARY SENTINEL LYMPH NODE BIOPSY, LEFT PROPHYLACTIC MASTECTOMY;  Surgeon: [name], MD;  Location: [place];  Service: General;  Laterality: Bilateral;   PORT-A-CATH REMOVAL Right 08/19/2019   Procedure: REMOVAL PORT-A-CATH;  Surgeon: [name], MD;  Location: [place];  Service: General;  Laterality: Right;   PORTACATH PLACEMENT Right 07/22/2017   Procedure: INSERTION PORT-A-CATH WITH [name];  Surgeon: [name], MD;  Location: [place];  Service: General;  Laterality: Right;    I have reviewed the social history and family history with the patient and they are unchanged from previous note.  ALLERGIES:  is allergic to eszopiclone.  MEDICATIONS:  Current Outpatient Medications  Medication Sig Dispense Refill   B Complex-C (B-COMPLEX WITH VITAMIN C) tablet Take 1 tablet by mouth daily.     calcium-vitamin D (OSCAL WITH D) 250-125 MG-UNIT tablet Take 1 tablet by mouth daily.     cetirizine (ZYRTEC) 10 MG tablet Take 10 mg by mouth daily.     Cholecalciferol (VITAMIN D) 2000 units tablet Take 2,000 Units by mouth daily.      exemestane (AROMASIN) 25 MG tablet TAKE 1 TABLET BY MOUTH EVERY DAY AFTER BREAKFAST 90 tablet 3   fluticasone (FLONASE) 50 MCG/ACT nasal spray Place into both nostrils daily.     venlafaxine XR (EFFEXOR-XR) 75 MG 24 hr capsule TAKE 3 CAPSULES BY MOUTH EVERY DAY WITH BREAKFAST 90 capsule 0   Current Facility-Administered Medications  Medication Dose Route Frequency Provider Last Rate Last Admin   gi cocktail (Maalox,Lidocaine,Donnatal)  30 mL Oral Once Harle Stanford., PA-C        PHYSICAL EXAMINATION: ECOG PERFORMANCE STATUS: 0 - Asymptomatic  Vitals:   08/09/22 1509  BP: 106/72  Pulse: 96  Resp: 18  Temp: 98.5 F (36.9 C)  SpO2: 100%   Wt Readings from Last 3 Encounters:  08/09/22 162 lb 8 oz (73.7 kg)  02/22/22 168 lb 9 oz (76.5 kg)  09/06/21 182 lb (82.6 kg)     GENERAL:alert, no distress and comfortable SKIN:  skin color, texture, turgor are normal, no rashes or significant lesions EYES: normal, Conjunctiva are pink and non-injected, sclera clear NECK: supple, thyroid normal size, non-tender, without nodularity LYMPH:  no palpable lymphadenopathy in the cervical, axillary LUNGS: clear to auscultation and percussion with normal breathing effort HEART: regular rate & rhythm and no murmurs and no lower extremity edema ABDOMEN:abdomen soft, non-tender and normal bowel sounds Musculoskeletal:no cyanosis of digits and no clubbing  NEURO: alert & oriented x 3 with fluent speech, no focal motor/sensory deficits BREAST: Status post bilateral mastectomy.  No palpable mass, nodules or adenopathy bilaterally. Breast exam benign.   LABORATORY DATA:  I have reviewed the data as listed    Latest Ref Rng & Units 08/09/2022    2:54 PM 02/22/2022    2:50 PM 11/29/2021    3:26 PM  CBC  WBC 4.0 - 10.5 K/uL 5.9  5.4  5.6   Hemoglobin 12.0 - 15.0 g/dL 12.7  12.6  12.8   Hematocrit 36.0 - 46.0 % 38.1  38.2  38.5   Platelets 150 - 400 K/uL 241  224  239         Latest Ref Rng & Units  08/09/2022    2:54 PM 02/22/2022    2:50 PM 11/29/2021    3:26 PM  CMP  Glucose 70 - 99 mg/dL 109  135  94   BUN 6 - 20 mg/dL '9  11  11   ' Creatinine 0.44 - 1.00 mg/dL 1.10  1.09  1.11   Sodium 135 - 145 mmol/L 141  140  138   Potassium 3.5 - 5.1 mmol/L 4.3  4.3  4.8   Chloride 98 - 111 mmol/L 106  105  103   CO2 22 - 32 mmol/L '30  26  29   ' Calcium 8.9 - 10.3 mg/dL 9.6  9.9  10.0   Total Protein 6.5 - 8.1 g/dL 7.5  7.9  7.8   Total Bilirubin 0.3 - 1.2 mg/dL 0.4  0.6  0.6   Alkaline Phos 38 - 126 U/L 114  117  120   AST 15 - 41 U/L '14  14  17   ' ALT 0 - 44 U/L '13  10  14       ' RADIOGRAPHIC STUDIES: I have personally reviewed the radiological images as listed and agreed with the findings in the report. No results found.    No orders of the defined types were placed in this encounter.  All questions were answered. The patient knows to call the clinic with any problems, questions or concerns. No barriers to learning was detected. The total time spent in the appointment was 30 minutes.     [name], MD 08/09/2022   I, Wilburn Mylar, am acting as scribe for [name], MD.   I have reviewed the above documentation for accuracy and completeness, and I agree with the above.

## 2022-08-10 LAB — CANCER ANTIGEN 27.29: CA 27.29: <9 U/mL (ref 0.0–38.6)

## 2022-08-12 ENCOUNTER — Inpatient Hospital Stay (HOSPITAL_BASED_OUTPATIENT_CLINIC_OR_DEPARTMENT_OTHER): Payer: BC Managed Care – PPO | Admitting: Gynecologic Oncology

## 2022-08-12 ENCOUNTER — Other Ambulatory Visit: Payer: Self-pay

## 2022-08-12 ENCOUNTER — Ambulatory Visit: Payer: BC Managed Care – PPO | Admitting: Psychiatry

## 2022-08-12 ENCOUNTER — Inpatient Hospital Stay (HOSPITAL_BASED_OUTPATIENT_CLINIC_OR_DEPARTMENT_OTHER): Payer: BC Managed Care – PPO | Admitting: Psychiatry

## 2022-08-12 ENCOUNTER — Encounter: Payer: Self-pay | Admitting: Psychiatry

## 2022-08-12 VITALS — BP 104/63 | HR 108 | Temp 98.4°F | Resp 18 | Ht 65.75 in | Wt 166.0 lb

## 2022-08-12 DIAGNOSIS — Z170 Estrogen receptor positive status [ER+]: Secondary | ICD-10-CM

## 2022-08-12 DIAGNOSIS — C50411 Malignant neoplasm of upper-outer quadrant of right female breast: Secondary | ICD-10-CM | POA: Diagnosis not present

## 2022-08-12 DIAGNOSIS — R9389 Abnormal findings on diagnostic imaging of other specified body structures: Secondary | ICD-10-CM

## 2022-08-12 MED ORDER — SENNOSIDES-DOCUSATE SODIUM 8.6-50 MG PO TABS: 2.0000 | ORAL_TABLET | Freq: Every day | ORAL | 0 refills | Status: DC

## 2022-08-12 MED ORDER — OXYCODONE HCL 5 MG PO TABS: 5.0000 mg | ORAL_TABLET | ORAL | 0 refills | Status: DC | PRN

## 2022-08-12 NOTE — H&P (View-Only) (Signed)
GYNECOLOGIC ONCOLOGY NEW PATIENT CONSULTATION  Date of Service: 08/12/2022 Referring Provider: [name], MD   ASSESSMENT AND PLAN: Sheila [name] is a 46 y.o. woman with hormone receptor positive breast cancer.  A discussion was held with the patient regarding the rationale for therapeutic removal of the ovaries. She has an estrogen receptor positive breast cancer and her medical oncologist has advised surgical ablation of ovarian function, which is currently being accomplished medically.  She is currently undergoing treatment with Zoladex injections and exemestane.  Patient wishes to be able to discontinue injections.  She understands that surgical menopause is permanent.  She endorses understanding and wishes to proceed with BSO.  Patient was consented for: Robotic assisted bilateral salpingo-oophorectomy on 09/03/2022.  The risks of surgery were discussed in detail and she understands these to including but not limited to bleeding requiring a blood transfusion, infection, injury to adjacent organs (including but not limited to the bowels, bladder, ureters, nerves, blood vessels), wound separation, hernia, unforseen complication, and possible need for re-exploration.  If the patient experiences any of these events, she understands that her hospitalization or recovery may be prolonged and that she may need to take additional medications for a prolonged period. The patient will receive DVT and antibiotic prophylaxis as indicated. She voiced a clear understanding. She had the opportunity to ask questions and written informed consent was obtained today. She wishes to proceed.  We will have the patient undergo repeat ultrasound prior to surgery given the indeterminant endometrial stripe in 2019 at a time at which patient was clinically postmenopausal. She does not require preoperative clearance. Her METs are >4.    A copy of this note was sent to the patient's referring provider.  Bernadene Bell, MD Gynecologic Oncology   Medical Decision Making I personally spent  TOTAL 40 minutes face-to-face and non-face-to-face in the care of this patient, which includes all pre, intra, and post visit time on the date of service.  5 minutes spent reviewing records prior to the visit 30 Minutes in patient contact 5 minutes charting , conferring with consultants etc.   ------------  CC: Hormone receptor positive breast cancer  HISTORY OF PRESENT ILLNESS:  Sheila [name] is a 46 y.o. woman who is seen in consultation at the request of [name], MD for evaluation of consideration of BSO in the setting of hormone receptor positive breast cancer.  She was diagnosed in July 2018 with pT3(m)N2aM0, stage 1b, ductal carcinoma triple positive, lobular carcinoma ER+/PR+/HER2-,  Grade 2.  She underwent adjuvant chemotherapy, radiation, maintenance Herceptin/Perjeta and 1 year of Neratinib.  She was started on adjuvant letrozole and monthly Zoladex injections in 04/2018. Letrozole was subsequently changed to anastrozole, then exemestane due to some joint and back pain.  Today presents today with her husband.  She reports that she is overall in good health.  She reports that she has had genetic testing done previously which was negative.  Since completing her treatment and while on hormonal therapy, patient reports that she has had menopausal symptoms with hot flashes and ceased menses.  She does report rare light spotting.  Of note, patient previously underwent an ultrasound on 08/14/2018 at which time patient reports she had already had postmenopausal symptoms.  Her endometrial stripe was noted to be 8 mm with normal-appearing ovaries with a 2 cm follicle on the left and 1 cm follicle in the right.   TREATMENT HISTORY: Oncology History Overview Note   Cancer Staging Malignant neoplasm of upper-outer quadrant  of right breast in female, estrogen receptor positive ([place]) Staging form: Breast, AJCC  8th Edition - Clinical stage from 06/11/2017: Stage IB (cT3, cN0, cM0, G2, ER: Positive, PR: Positive, HER2: Positive) - Signed by [name], MD on 06/16/2017 - Pathologic stage from 07/22/2017: Stage IB (pT3(m), pN2a(sn), cM0, G2, ER: Positive, PR: Positive, HER2: Positive) - Signed by [name], NP on 08/04/2017    Malignant neoplasm of upper-outer quadrant of right breast in female, estrogen receptor positive ([place])  06/11/2017 Initial Biopsy   Diagnosis 06/11/17 Breast, right, needle core biopsy, 11:00 o'clock - INVASIVE DUCTAL CARCINOMA, G2   06/11/2017 Mammogram   [name] and [facility] Diagnostic Breast Tomo Bilateral 06/11/17 IMPRESSION: 1. Highly suspicious mass within the retroareolar right breast, extending from the 8:00 to 11:00 axes, anterior to posterior depth, extending anteriorly to the nipple with associated nipple retraction, measuring at least 5.5 cm by ultrasound   2. No sonographic evidence of metastatic lymphadenopathy in the right axilla.  3. No evidence of malignancy within the left breast.    06/11/2017 Initial Diagnosis   Malignant neoplasm of upper-outer quadrant of right breast in female, estrogen receptor positive ([place])   06/11/2017 Receptors her2   Estrogen Receptor: 100%, POSITIVE, STRONG STAINING INTENSITY Progesterone Receptor: 100%, POSITIVE, STRONG STAINING INTENSITY Proliferation Marker Ki67: 12%Proliferation Marker Ki67: 12%  HER2 - **POSITIVE** RATIO OF HER2/CEP17 SIGNALS 2.55 AVERAGE HER2 COPY NUMBER PER CELL 4.85   06/25/2017 Genetic Testing   Negative genetic testing on the 9 gene STAT panel.  The STAT Breast cancer panel offered by [facility] includes sequencing and rearrangement analysis for the following 9 genes:  ATM, BRCA1, BRCA2, CDH1, CHEK2, PALB2, PTEN, STK11 and TP53.   The report date is June 25, 2017.  Negative genetic testing on the common hereditary cancer panel.  The Hereditary Gene Panel offered by [facility] includes sequencing and/or deletion  duplication testing of the following 46 genes: APC, ATM, AXIN2, BARD1, BMPR1A, BRCA1, BRCA2, BRIP1, CDH1, CDKN2A (p14ARF), CDKN2A (p16INK4a), CHEK2, CTNNA1, DICER1, EPCAM (Deletion/duplication testing only), GREM1 (promoter region deletion/duplication testing only), KIT, MEN1, MLH1, MSH2, MSH3, MSH6, MUTYH, NBN, NF1, NHTL1, PALB2, PDGFRA, PMS2, POLD1, POLE, PTEN, RAD50, RAD51C, RAD51D, SDHB, SDHC, SDHD, SMAD4, SMARCA4. STK11, TP53, TSC1, TSC2, and VHL.  The following genes were evaluated for sequence changes only: SDHA and HOXB13 c.251G>A variant only.  The report date is June 25, 2017.    07/02/2017 Imaging   CT cAP 07/02/17 IMPRESSION: 1. Subareolar right breast mass. No compelling findings of nodal or metastatic involvement. 2. Old granulomatous disease. 3. There is a 7 mm enhancing or hyperdense focus posteriorly in segment 7 of the liver in the subcapsular region. By virtue of its small size this lesion is technically nonspecific although statistically likely to be a small benign lesions such as flash filling hemangioma. This may warrant surveillance. 4.  Prominent stool throughout the colon favors constipation. 5. Degenerative disc disease and spondylosis at L5-S1 likely causing mild impingement.   07/02/2017 Imaging   Bone scan 07/02/17 IMPRESSION: Today' s exam is negative. I ascribed the tiny focus of activity just proximal to the left antecubital region to injection site.   07/22/2017 Surgery   RIGHT TOTAL MASTECTOMY WITH RIGHT AXILLARY SENTINEL LYMPH NODE BIOPSY, LEFT PROPHYLACTIC MASTECTOM and INSERTION PORT-A-CATH WITH [name] by [name] and [facility]    07/22/2017 Pathology Results   Diagnosis 1. Breast, simple mastectomy, Left - FIBROADENOMA. - NO MALIGNANCY IDENTIFIED. 2. Breast, simple mastectomy, Right - MIXED INVASIVE LOBULAR AND DUCTAL  CARCINOMA, GRADE 2, SPANNING 5.3 CM. - ADDITIONAL FOCUS OF LOBULAR CARCINOMA, GRADE 2, SPANNING 2.2 CM. - INTERMEDIATE GRADE DUCTAL  CARCINOMA IN SITU. - INVASIVE CARCINOMA COMES TO WITHIN 0.2 TO 0.3 CM OF THE DEEP MARGIN, FOCALLY. - TUMOR FOCALLY INVOLVES EPIDERMIS. - LYMPHOVASCULAR INVASION OF DERMAL LYMPHATICS. - ONE OF ONE LYMPH NODES NEGATIVE FOR CARCINOMA (0/1). - SEE ONCOLOGY TABLE. 3. Lymph node, sentinel, biopsy, Right axillary - ONE LYMPH NODE WITH ISOLATED TUMOR CELLS (0/1). 4. Lymph node, sentinel, biopsy, Right - METASTATIC CARCINOMA IN ONE OF ONE LYMPH NODES (1/1). 5. Lymph node, sentinel, biopsy, Right - ONE OF ONE LYMPH NODES NEGATIVE FOR CARCINOMA (0/1). 6. Lymph node, sentinel, biopsy, Right - METASTATIC CARCINOMA IN ONE OF ONE LYMPH NODES (1/1). - EXTRACAPSULAR EXTENSION. 7. Lymph node, sentinel, biopsy, Right - METASTATIC CARCINOMA IN ONE OF ONE LYMPH NODES (1/1). - EXTRACAPSULAR EXTENSION. 8. Lymph node, sentinel, biopsy, Right - ONE OF ONE LYMPH NODES NEGATIVE FOR CARCINOMA (0/1). 9. Lymph node, sentinel, biopsy, Right - ONE OF ONE LYMPH NODES NEGATIVE FOR CARCINOMA (0/1). 10. Lymph node, sentinel, biopsy, Right - METASTATIC CARCINOMA IN ONE OF ONE LYMPH NODES (1/1). - EXTRACAPSULAR EXTENSION. 11. Lymph node, biopsy, Right axillary - ONE OF ONE LYMPH NODES NEGATIVE FOR CARCINOMA (0/1).    07/22/2017 Receptors her2   Ductal carcinoma in primary breast tumor and node metastasis are ER 95%, PR 95% strongly positive, and HER2 + The lobular component in primary breast tumor is ER 95% positive, PR 95% positive, strong staining, HER-2 negative.   Original biopsy [id number]: Ductal morphology, Her2 FISH positive. Current specimen: Main focus is 75% ductal morphology, 25% lobular morphology. Smaller focus is lobular morphology. #2A Lobular morphology, Her IHC negative. #2E Lobular morphology, Her2 FISH negative, Her2 IHC negative. #4 50% lobular and 50% ductal morphology, Her2 FISH positive, Her2 IHC negative. #6 Ductal morphology #7 Ductal morphology #10 50% lobular, 50% ductal morphology    08/25/2017 Imaging   MRI abdomen done at [facility]:  Enhancing structure measing 5 mm is compatible with a flash filling hemangioma. No specific findings to suggest metastatic cancer   08/25/2017 Imaging   MRI Abdomen at [facility] 08/25/17  IMPRESSION: 1. Lesion in segment 7 of the liver has signal and enhancement characteristics compatible with a flash filling hemangioma. No specific findings identified to suggest metastatic disease.   08/29/2017 - 12/12/2017 Chemotherapy   adjuvant TCHP every 3 weeks for 6 cycles starting 08/29/17. Postponed and decreased Carbo to 665m and added Onpro with cycle 2 on 09/19/17 due to neutropenic fever. Carbplatin further reduced to 5034mstarting with cycle 3 due to thrombocytopenia. Herceptin was held for Cycle 4 and 5 due to worsening global longitudinal strain found on ECHO from 11/07/17. Completed 6 cycles on 12/12/17      09/04/2017 - 09/09/2017 Hospital Admission   Admit date: 09/04/2017 Discharge date: 09/09/2017   DISCHARGE DIAGNOSES:  Principal Problem:   [facility] [place] Problems:   Malignant neoplasm of upper-outer quadrant of right breast in female, estrogen receptor positive ([place]  Port-A-Cath in place   Neutropenic fever ([facility]  Hyponatremia   Malignant neoplasm of breast in female, estrogen receptor positive ([place]    12/28/2017 Imaging   CT CHEST IMPRESSION: 1. 2.3 x 2.7 x 1.2 cm irregular masslike opacity within the right middle lobe worrisome for malignancy/metastasis. 2. 3 new very small sclerotic lesions, within the sternum and the body of T4, worrisome for new metastases.   12/28/2017 - 01/05/2018 Hospital Admission  Admit date: 12/28/17-01/05/18 Admission diagnosis: multifocal pneumonia, influenza A, sepsis, hypoxia Additional comments: imaging during hospitalization notable for new irregular masslike opacity in the right middle lobe worrisome for metastasis and new very small sclerotic lesion within the sternum and the body of  T4, worrisome for metastasis   12/29/2017 Imaging   CT ABD/PELVIS IMPRESSION: No definite acute intra-abdominal or intrapelvic abnormalities.   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   12/30/2017 Imaging   CT HEAD IMPRESSION: No acute intracranial abnormality or enhancing lesion.   01/16/2018 - 01/22/2019 Chemotherapy   Maintence Herceptin and Perjeta every 3 weeks for 6-12 months starting 01/16/18. Held 03/2018-07/17/18 due to low EF. Completed on 01/22/19    02/04/2018 PET scan   IMPRESSION: 1. The previous consolidation in the right middle lobe currently has a more nodular and reticular appearance with some associated mild atelectasis along its anterior margin. This demonstrates low-grade metabolic activity with maximum SUV 3.2 (background blood pool activity is 2.7). Although conceivably related to low-grade residuum from the patient's malignancy, the presence of calcified right hilar lymph nodes and small calcifications along the nodularity raise suspicion for a low-grade active granulomatous process or atypical infectious process. Surveillance is recommended. 2. Scattered deposits of hypermetabolic but benign metabolically active brown fat.   02/23/2018 - 04/09/2018 Radiation Therapy   Radiation treatment dates:   02/23/2018 - 04/09/2018   Site/dose:   The patient initially received a dose of 50.4 Gy in 28 fractions to the right chest wall and supraclavicular region. This was delivered using a 3-D conformal, 4 field technique. The patient then received a boost to the mastectomy scar. This delivered an additional 10 Gy in 5 fractions using an en face electron field. The total dose was 60.4 Gy.   Narrative: The patient tolerated radiation treatment relatively well.   The patient had some expected skin irritation as she progressed during treatment. Moist desquamation was not present at the end of treatment.   03/20/2018 -  Anti-estrogen oral therapy    -Monthly Zoladex injection starting 03/20/18  -Letrozole 2.4m once dialy starting 05/03/18  -Switched to Anastrozole starting 07/2018 Due to her worsening back pain. -Changed to Exemestane in 04/2019 due to back pain exacerbation.    04/06/2018 Imaging   CT Chest wo contrast IMPRESSION: 1. Stable linear nodular thickening in the RIGHT middle lobe is favored post infectious or inflammatory scarring. 2. Calcified RIGHT hilar lymph node is consistent post infectious process. 3. Stable small LEFT upper lobe nodule. 4. Stable sclerotic lesion in the manubrium. Sclerotic lesion at T3 is more prominent (4 mm). Sclerotic lesion at L 1 is new from 07/02/2017.   04/06/2018 Imaging   04/06/2018 CT Chest IMPRESSION: 1. Stable linear nodular thickening in the RIGHT middle lobe is favored post infectious or inflammatory scarring. 2. Calcified RIGHT hilar lymph node is consistent post infectious process. 3. Stable small LEFT upper lobe nodule. 4. Stable sclerotic lesion in the manubrium. Sclerotic lesion at T3 is more prominent (4 mm). Sclerotic lesion at L 1 is new from 07/02/2017.   04/10/2018 Echocardiogram   LV EF 45%-50%   05/25/2018 Echocardiogram   LV EF 45%-50%   07/13/2018 Echocardiogram   Normal LV size with EF 55-60%   07/31/2018 Imaging   07/31/2018 Bone Scan IMPRESSION: No evidence of skeletal metastatic disease.   07/31/2018 Imaging   07/31/2018 CT CAP IMPRESSION: Residual linear and nodular peribronchovascular opacities in the right middle lobe, with other scattered areas of ground-glass  opacities in the right middle, right upper and right lower lobes, which are located within the previously demonstrated bulky airspace consolidation. The findings are improved from the chest CT dated 12/30/2017, and relatively stable from PET-CT dated 02/04/2018. These may represent post infectious/post inflammatory changes or residual malignancy.   Stable partially calcified right hilar  lymphadenopathy.   Interval development of nodular soft tissue thickening within the anterior mediastinum in the area of the thymus, new from chest CT dated 12/30/2017. This may represent reactive thymic tissue, thymoma or lymphadenopathy.   1.8 cm probably physiologic left ovarian cyst. Borderline thickening of the endometrium measuring 15.6 mm. Further evaluation with pelvic ultrasound may be considered.   No new or suspicious findings within the abdomen.   09/15/2018 Echocardiogram   EF 55-60%   01/28/2019 Imaging   MRI Lumbar spie 01/28/19  IMPRESSION: 1. Subcentimeter lesions in the L1 and L2 vertebral bodies, indeterminate for metastases. 2. Mild lower lumbar disc degeneration without stenosis.   01/28/2019 Imaging   CT chest 01/28/19  IMPRESSION: Right lung post radiation changes. No active lung disease or other acute findings.   No evidence of recurrent or metastatic carcinoma within the thorax.   03/15/2019 - 02/2020 Chemotherapy   Anti-HER2 Neratinib 240 mg daily, starting 03/15/19. She completed 1 year of treatment in 02/2020     PAST MEDICAL HISTORY: Past Medical History:  Diagnosis Date   Anxiety    Cancer ([place])    right breast    Cardiomyopathy ([place])    Family history of breast cancer    Family history of colon cancer    GERD (gastroesophageal reflux disease)    Headache    couple of migraines in the past   History of kidney stones    Vitamin D deficiency     PAST SURGICAL HISTORY: Past Surgical History:  Procedure Laterality Date   egg donation     kidney stone removal     MASS EXCISION Right 08/19/2019   Procedure: REMOVE EXCESS TISSUE FROM RIGHT LATERAL MASTECTOMY SCAR;  Surgeon: [name], MD;  Location: [place];  Service: General;  Laterality: Right;   MASTECTOMY W/ SENTINEL NODE BIOPSY Bilateral 07/22/2017   Procedure: RIGHT TOTAL MASTECTOMY WITH RIGHT AXILLARY SENTINEL LYMPH NODE BIOPSY, LEFT PROPHYLACTIC MASTECTOMY;   Surgeon: [name], MD;  Location: [place];  Service: General;  Laterality: Bilateral;   PORT-A-CATH REMOVAL Right 08/19/2019   Procedure: REMOVAL PORT-A-CATH;  Surgeon: [name], MD;  Location: [place];  Service: General;  Laterality: Right;   PORTACATH PLACEMENT Right 07/22/2017   Procedure: INSERTION PORT-A-CATH WITH [name];  Surgeon: [name], MD;  Location: [place];  Service: General;  Laterality: Right;    OB/GYN HISTORY: OB History  Gravida Para Term Preterm AB Living  0 0 0 0 0 0  SAB IAB Ectopic Multiple Live Births  0 0 0 0 0      Age at menarche: 15 Age at menopause: 65 or 63 secondary to treatment of her breast cancer Hx of HRT: None; on hormone therapy for breast cancer Hx of STI: None Last pap: 2018 History of abnormal pap smears: Reports an abnormal Pap smear 20 years ago that was repeated 1 week later and normal (suspect this was an adequate sample); all normal since  SCREENING STUDIES:  Last mammogram: 06/2017 Last colonoscopy: None  MEDICATIONS:  Current Outpatient Medications:    B Complex-C (B-COMPLEX WITH VITAMIN C) tablet, Take 1 tablet by mouth daily., Disp: ,  Rfl:    calcium-vitamin D (OSCAL WITH D) 250-125 MG-UNIT tablet, Take 1 tablet by mouth daily., Disp: , Rfl:    cetirizine (ZYRTEC) 10 MG tablet, Take 10 mg by mouth daily., Disp: , Rfl:    Cholecalciferol (VITAMIN D) 2000 units tablet, Take 2,000 Units by mouth daily., Disp: , Rfl:    exemestane (AROMASIN) 25 MG tablet, TAKE 1 TABLET BY MOUTH EVERY DAY AFTER BREAKFAST, Disp: 90 tablet, Rfl: 3   fluticasone (FLONASE) 50 MCG/ACT nasal spray, Place into both nostrils daily., Disp: , Rfl:    venlafaxine XR (EFFEXOR-XR) 75 MG 24 hr capsule, TAKE 3 CAPSULES BY MOUTH EVERY DAY WITH BREAKFAST, Disp: 90 capsule, Rfl: 0  Current Facility-Administered Medications:    gi cocktail (Maalox,Lidocaine,Donnatal), 30 mL, Oral, Once, Tanner, Lyndon Code., PA-C  ALLERGIES: Allergies   Allergen Reactions   Eszopiclone     Other reaction(s): felt like a zombie    FAMILY HISTORY: Family History  Problem Relation Age of Onset   Breast cancer Mother 2   Osteoporosis Mother    Stroke Maternal Grandfather    Breast cancer Paternal Grandmother 65   Colon cancer Paternal Grandmother 62   Prostate cancer Paternal Grandfather        dx in his 98s   Leukemia Paternal Grandfather    Lung cancer Maternal Uncle        heavy smoker   Other Paternal Uncle        farm accident   Ovarian cancer Neg Hx     SOCIAL HISTORY: Social History   Socioeconomic History   Marital status: Single    Spouse name: Not on file   Number of children: Not on file   Years of education: Not on file   Highest education level: Not on file  Occupational History   Not on file  Tobacco Use   Smoking status: Never   Smokeless tobacco: Former  Scientific laboratory technician Use: Never used  Substance and Sexual Activity   Alcohol use: Yes    Comment: once a year   Drug use: No   Sexual activity: Yes  Other Topics Concern   Not on file  Social History Narrative   Not on file   Social Determinants of Health   Financial Resource Strain: Not on file  Food Insecurity: Not on file  Transportation Needs: Not on file  Physical Activity: Not on file  Stress: Not on file  Social Connections: Not on file  Intimate Partner Violence: Not on file    REVIEW OF SYSTEMS: New patient intake form was reviewed.  Complete 10-system review is negative except for the following: Hot flashes  PHYSICAL EXAM: BP 104/63 (BP Location: Left Arm, Patient Position: Sitting)   Pulse (!) 108   Temp 98.4 F (36.9 C) (Oral)   Resp 18   Ht 5' 5.75" (1.67 m)   Wt 166 lb (75.3 kg)   SpO2 100%   BMI 27.00 kg/m  Constitutional: No acute distress. Neuro/Psych: Alert, oriented.  Head and Neck: Normocephalic, atraumatic. Neck symmetric without masses. Sclera anicteric.  Respiratory: Normal work of breathing. Clear to  auscultation bilaterally. Cardiovascular: Regular rate and rhythm, no murmurs, rubs, or gallops. Abdomen: Normoactive bowel sounds. Soft, non-distended, non-tender to palpation. No masses or hepatosplenomegaly appreciated. No evidence of hernia. No palpable fluid wave.  Extremities: Grossly normal range of motion. Warm, well perfused. No edema bilaterally. Skin: No rashes or lesions. Lymphatic: No cervical, supraclavicular, or inguinal adenopathy. Genitourinary: External genitalia  without lesions. Urethral meatus without lesions or prolapse. On speculum exam, atrophic vagina and cervix without lesions. Bimanual exam reveals normal cervix, small mobile uterus and no adnexal masses or tenderness. Exam chaperoned by Joylene John, NP   LABORATORY AND RADIOLOGIC DATA: Outside medical records were reviewed to synthesize the above history, along with the history and physical obtained during the visit.  Outside laboratory and imaging reports were reviewed, with pertinent results below.  I personally reviewed the outside images.  WBC  Date Value Ref Range Status  08/09/2022 5.9 4.0 - 10.5 K/uL Final   Hemoglobin  Date Value Ref Range Status  08/09/2022 12.7 12.0 - 15.0 g/dL Final   HGB  Date Value Ref Range Status  11/21/2017 9.1 (L) 11.6 - 15.9 g/dL Final   HCT  Date Value Ref Range Status  08/09/2022 38.1 36.0 - 46.0 % Final  11/21/2017 27.5 (L) 34.8 - 46.6 % Final   Hematocrit  Date Value Ref Range Status  05/05/2020 35.9 34.0 - 46.6 % Final   Platelets  Date Value Ref Range Status  08/09/2022 241 150 - 400 K/uL Final  11/21/2017 162 145 - 400 10e3/uL Final   Magnesium  Date Value Ref Range Status  01/05/2018 1.9 1.7 - 2.4 mg/dL Final    Comment:    Performed at [facility], [place] [address], [place], [place] [address]   Creatinine  Date Value Ref Range Status  09/06/2021 0.97 0.44 - 1.00 mg/dL Final  11/21/2017 0.9 0.6 - 1.1 mg/dL Final   Creatinine, Ser   Date Value Ref Range Status  08/09/2022 1.10 (H) 0.44 - 1.00 mg/dL Final   AST  Date Value Ref Range Status  08/09/2022 14 (L) 15 - 41 U/L Final  09/06/2021 18 15 - 41 U/L Final  11/21/2017 23 5 - 34 U/L Final   ALT  Date Value Ref Range Status  08/09/2022 13 0 - 44 U/L Final  09/06/2021 14 0 - 44 U/L Final  11/21/2017 31 0 - 55 U/L Final    Lab Results  Component Value Date   CA2729 <9.0 08/09/2022   CA2729 <9.0 02/22/2022   CA2729 5.4 11/29/2021      US PELVIC COMPLETE WITH TRANSVAGINAL 08/13/2018  Narrative CLINICAL DATA:  Follow-up left ovarian cyst and endometrium from CT  EXAM: TRANSABDOMINAL AND TRANSVAGINAL ULTRASOUND OF PELVIS  TECHNIQUE: Both transabdominal and transvaginal ultrasound examinations of the pelvis were performed. Transabdominal technique was performed for global imaging of the pelvis including uterus, ovaries, adnexal regions, and pelvic cul-de-sac. It was necessary to proceed with endovaginal exam following the transabdominal exam to visualize the ovaries.  COMPARISON:  CT 07/31/2018  FINDINGS: Uterus  Measurements: 6.8 x 3.4 x 3.6 cm. No fibroids or other mass visualized.  Endometrium  Thickness: 8 mm in thickness.  No focal abnormality visualized.  Right ovary  Measurements: 3.4 x 1.7 x 2.1 cm. Normal appearance. Small follicle measures up to 1 cm  Left ovary  Measurements: 4.3 x 2.1 x 2.6 cm. Normal appearance. Small follicle measures up to 2 cm.  Other findings  No abnormal free fluid.  IMPRESSION: Normal pelvic ultrasound.   Electronically Signed By: Rolm Baptise M.D. On: 08/14/2018 09:20

## 2022-08-12 NOTE — Patient Instructions (Addendum)
Preparing for your Surgery  Plan for surgery on September 03, 2022 with Dr. Bernadene Bell at [facility] will be scheduled for robotic assisted laparoscopic bilateral salpingo-oophorectomy (removal of both ovaries and fallopian tubes), possible endometrial biopsy (biopsy from the lining of the uterus), possible dilation and curettage (scraping/sampling from the endometrium (lining of the uterus)).   Pre-operative Testing -You will receive a phone call from presurgical testing at [facility] to arrange for a pre-operative appointment and lab work.  -Bring your insurance card, copy of an advanced directive if applicable, medication list  -At that visit, you will be asked to sign a consent for a possible blood transfusion in case a transfusion becomes necessary during surgery.  The need for a blood transfusion is rare but having consent is a necessary part of your care.     -You should not be taking blood thinners or aspirin at least ten days prior to surgery unless instructed by your surgeon.  -Do not take supplements such as fish oil (omega 3), red yeast rice, turmeric before your surgery. You want to avoid medications with aspirin in them including headache powders such as BC or Goody's), Excedrin migraine.  Day Before Surgery at [place] will be asked to take in a light diet the day before surgery. You will be advised you can have clear liquids up until 3 hours before your surgery.    Eat a light diet the day before surgery.  Examples including soups, broths, toast, yogurt, mashed potatoes.  AVOID GAS PRODUCING FOODS. Things to avoid include carbonated beverages (fizzy beverages, sodas), raw fruits and raw vegetables (uncooked), or beans.   If your bowels are filled with gas, your surgeon will have difficulty visualizing your pelvic organs which increases your surgical risks.  Your role in recovery Your role is to become active as soon as directed by your doctor, while  still giving yourself time to heal.  Rest when you feel tired. You will be asked to do the following in order to speed your recovery:  - Cough and breathe deeply. This helps to clear and expand your lungs and can prevent pneumonia after surgery.  - [place]. Do mild physical activity. Walking or moving your legs help your circulation and body functions return to normal. Do not try to get up or walk alone the first time after surgery.   -If you develop swelling on one leg or the other, pain in the back of your leg, redness/warmth in one of your legs, please call the office or go to the Emergency Room to have a doppler to rule out a blood clot. For shortness of breath, chest pain-seek care in the Emergency Room as soon as possible. - Actively manage your pain. Managing your pain lets you move in comfort. We will ask you to rate your pain on a scale of zero to 10. It is your responsibility to tell your doctor or nurse where and how much you hurt so your pain can be treated.  Special Considerations -If you are diabetic, you may be placed on insulin after surgery to have closer control over your blood sugars to promote healing and recovery.  This does not mean that you will be discharged on insulin.  If applicable, your oral antidiabetics will be resumed when you are tolerating a solid diet.  -Your final pathology results from surgery should be available around one week after surgery and the results will be relayed to  you when available.  -Dr. Lahoma Crocker is the surgeon that assists your GYN Oncologist with surgery.  If you end up staying the night, the next day after your surgery you will either see Dr. Ernestina Patches, Dr. Berline Lopes, or Dr. Lahoma Crocker.  -FMLA forms can be faxed to [phone] and please allow 5-7 business days for completion.  Pain Management After Surgery -You have been prescribed your pain medication and bowel regimen medications before surgery so that you  can have these available when you are discharged from the hospital. The pain medication is for use ONLY AFTER surgery and a new prescription will not be given.   -Make sure that you have Tylenol and Ibuprofen IF YOU ARE ABLE TO TAKE THESE MEDICATIONS at home to use on a regular basis after surgery for pain control. We recommend alternating the medications every hour to six hours since they work differently and are processed in the body differently for pain relief.  -Review the attached handout on narcotic use and their risks and side effects.   Bowel Regimen -You have been prescribed Sennakot-S to take nightly to prevent constipation especially if you are taking the narcotic pain medication intermittently.  It is important to prevent constipation and drink adequate amounts of liquids. You can stop taking this medication when you are not taking pain medication and you are back on your normal bowel routine.  Risks of Surgery Risks of surgery are low but include bleeding, infection, damage to surrounding structures, re-operation, blood clots, and very rarely death.   Blood Transfusion Information (For the consent to be signed before surgery)  We will be checking your blood type before surgery so in case of emergencies, we will know what type of blood you would need.                                            WHAT IS A BLOOD TRANSFUSION?  A transfusion is the replacement of blood or some of its parts. Blood is made up of multiple cells which provide different functions. Red blood cells carry oxygen and are used for blood loss replacement. White blood cells fight against infection. Platelets control bleeding. Plasma helps clot blood. Other blood products are available for specialized needs, such as hemophilia or other clotting disorders. BEFORE THE TRANSFUSION  Who gives blood for transfusions?  You may be able to donate blood to be used at a later date on yourself (autologous  donation). Relatives can be asked to donate blood. This is generally not any safer than if you have received blood from a stranger. The same precautions are taken to ensure safety when a relative's blood is donated. Healthy volunteers who are fully evaluated to make sure their blood is safe. This is blood bank blood. Transfusion therapy is the safest it has ever been in the practice of medicine. Before blood is taken from a donor, a complete history is taken to make sure that person has no history of diseases nor engages in risky social behavior (examples are intravenous drug use or sexual activity with multiple partners). The donor's travel history is screened to minimize risk of transmitting infections, such as malaria. The donated blood is tested for signs of infectious diseases, such as HIV and hepatitis. The blood is then tested to be sure it is compatible with you in order to minimize the chance of a transfusion  reaction. If you or a relative donates blood, this is often done in anticipation of surgery and is not appropriate for emergency situations. It takes many days to process the donated blood. RISKS AND COMPLICATIONS Although transfusion therapy is very safe and saves many lives, the main dangers of transfusion include:  Getting an infectious disease. Developing a transfusion reaction. This is an allergic reaction to something in the blood you were given. Every precaution is taken to prevent this. The decision to have a blood transfusion has been considered carefully by your caregiver before blood is given. Blood is not given unless the benefits outweigh the risks.  AFTER SURGERY INSTRUCTIONS  Return to work: 4-6 weeks if applicable  Activity: 1. Be up and out of the bed during the day.  Take a nap if needed.  You may walk up steps but be careful and use the hand rail.  Stair climbing will tire you more than you think, you may need to stop part way and rest.   2. No lifting or straining  for 6 weeks over 10 pounds. No pushing, pulling, straining for 6 weeks.  3. No driving for around 1 week(s).  Do not drive if you are taking narcotic pain medicine and make sure that your reaction time has returned.   4. You can shower as soon as the next day after surgery. Shower daily.  Use your regular soap and water (not directly on the incision) and pat your incision(s) dry afterwards; don't rub.  No tub baths or submerging your body in water until cleared by your surgeon. If you have the soap that was given to you by pre-surgical testing that was used before surgery, you do not need to use it afterwards because this can irritate your incisions.   5. No sexual activity and nothing in the vagina for 4 weeks.  6. You may experience a small amount of clear drainage from your incisions, which is normal.  If the drainage persists, increases, or changes color please call the office.  7. Do not use creams, lotions, or ointments such as neosporin on your incisions after surgery until advised by your surgeon because they can cause removal of the dermabond glue on your incisions.    8. You may experience vaginal spotting after surgery.  The spotting is normal but if you experience heavy bleeding, call our office.  9. Take Tylenol or ibuprofen first for pain if you are able to take these medications and only use narcotic pain medication for severe pain not relieved by the Tylenol or Ibuprofen.  Monitor your Tylenol intake to a max of 4,000 mg in a 24 hour period. You can alternate these medications after surgery.  Diet: 1. Low sodium Heart Healthy Diet is recommended but you are cleared to resume your normal (before surgery) diet after your procedure.  2. It is safe to use a laxative, such as Miralax or Colace, if you have difficulty moving your bowels. You have been prescribed Sennakot-S to take at bedtime every evening after surgery to keep bowel movements regular and to prevent constipation.     Wound Care: 1. Keep clean and dry.  Shower daily.  Reasons to call the Doctor: Fever - Oral temperature greater than 100.4 degrees Fahrenheit Foul-smelling vaginal discharge Difficulty urinating Nausea and vomiting Increased pain at the site of the incision that is unrelieved with pain medicine. Difficulty breathing with or without chest pain New calf pain especially if only on one side Sudden, continuing increased  vaginal bleeding with or without clots.   Contacts: For questions or concerns you should contact:  Dr. Bernadene Bell at [facility], NP at [phone]  After Hours: call [phone] and have the GYN Oncologist paged/contacted (after 5 pm or on the weekends).  Messages sent via mychart are for non-urgent matters and are not responded to after hours so for urgent needs, please call the after hours number.

## 2022-08-12 NOTE — Progress Notes (Unsigned)
GYNECOLOGIC ONCOLOGY NEW PATIENT CONSULTATION  Date of Service: 08/12/2022 Referring Provider: [name], MD   ASSESSMENT AND PLAN: Sheila [name] is a 46 y.o. woman with hormone receptor positive breast cancer.  A discussion was held with the patient regarding the rationale for therapeutic removal of the ovaries. She has an estrogen receptor positive breast cancer and her medical oncologist has advised surgical ablation of ovarian function, which is currently being accomplished medically.  She is currently undergoing treatment with Zoladex injections and exemestane.  Patient wishes to be able to discontinue injections.  She understands that surgical menopause is permanent.  She endorses understanding and wishes to proceed with BSO.  Patient was consented for: Robotic assisted bilateral salpingo-oophorectomy on 09/03/2022.  The risks of surgery were discussed in detail and she understands these to including but not limited to bleeding requiring a blood transfusion, infection, injury to adjacent organs (including but not limited to the bowels, bladder, ureters, nerves, blood vessels), wound separation, hernia, unforseen complication, and possible need for re-exploration.  If the patient experiences any of these events, she understands that her hospitalization or recovery may be prolonged and that she may need to take additional medications for a prolonged period. The patient will receive DVT and antibiotic prophylaxis as indicated. She voiced a clear understanding. She had the opportunity to ask questions and written informed consent was obtained today. She wishes to proceed.  We will have the patient undergo repeat ultrasound prior to surgery given the indeterminant endometrial stripe in 2019 at a time at which patient was clinically postmenopausal. She does not require preoperative clearance. Her METs are >4.    A copy of this note was sent to the patient's referring provider.  Bernadene Bell, MD Gynecologic Oncology   Medical Decision Making I personally spent  TOTAL 40 minutes face-to-face and non-face-to-face in the care of this patient, which includes all pre, intra, and post visit time on the date of service.  5 minutes spent reviewing records prior to the visit 30 Minutes in patient contact 5 minutes charting , conferring with consultants etc.   ------------  CC: Hormone receptor positive breast cancer  HISTORY OF PRESENT ILLNESS:  Sheila [name] is a 46 y.o. woman who is seen in consultation at the request of [name], MD for evaluation of consideration of BSO in the setting of hormone receptor positive breast cancer.  She was diagnosed in July 2018 with pT3(m)N2aM0, stage 1b, ductal carcinoma triple positive, lobular carcinoma ER+/PR+/HER2-,  Grade 2.  She underwent adjuvant chemotherapy, radiation, maintenance Herceptin/Perjeta and 1 year of Neratinib.  She was started on adjuvant letrozole and monthly Zoladex injections in 04/2018. Letrozole was subsequently changed to anastrozole, then exemestane due to some joint and back pain.  Today presents today with her husband.  She reports that she is overall in good health.  She reports that she has had genetic testing done previously which was negative.  Since completing her treatment and while on hormonal therapy, patient reports that she has had menopausal symptoms with hot flashes and ceased menses.  She does report rare light spotting.  Of note, patient previously underwent an ultrasound on 08/14/2018 at which time patient reports she had already had postmenopausal symptoms.  Her endometrial stripe was noted to be 8 mm with normal-appearing ovaries with a 2 cm follicle on the left and 1 cm follicle in the right.   TREATMENT HISTORY: Oncology History Overview Note   Cancer Staging Malignant neoplasm of upper-outer quadrant  of right breast in female, estrogen receptor positive ([place]) Staging form: Breast, AJCC  8th Edition - Clinical stage from 06/11/2017: Stage IB (cT3, cN0, cM0, G2, ER: Positive, PR: Positive, HER2: Positive) - Signed by [name], MD on 06/16/2017 - Pathologic stage from 07/22/2017: Stage IB (pT3(m), pN2a(sn), cM0, G2, ER: Positive, PR: Positive, HER2: Positive) - Signed by [name], NP on 08/04/2017    Malignant neoplasm of upper-outer quadrant of right breast in female, estrogen receptor positive ([place])  06/11/2017 Initial Biopsy   Diagnosis 06/11/17 Breast, right, needle core biopsy, 11:00 o'clock - INVASIVE DUCTAL CARCINOMA, G2   06/11/2017 Mammogram   [name] and [facility] Diagnostic Breast Tomo Bilateral 06/11/17 IMPRESSION: 1. Highly suspicious mass within the retroareolar right breast, extending from the 8:00 to 11:00 axes, anterior to posterior depth, extending anteriorly to the nipple with associated nipple retraction, measuring at least 5.5 cm by ultrasound   2. No sonographic evidence of metastatic lymphadenopathy in the right axilla.  3. No evidence of malignancy within the left breast.    06/11/2017 Initial Diagnosis   Malignant neoplasm of upper-outer quadrant of right breast in female, estrogen receptor positive ([place])   06/11/2017 Receptors her2   Estrogen Receptor: 100%, POSITIVE, STRONG STAINING INTENSITY Progesterone Receptor: 100%, POSITIVE, STRONG STAINING INTENSITY Proliferation Marker Ki67: 12%Proliferation Marker Ki67: 12%  HER2 - **POSITIVE** RATIO OF HER2/CEP17 SIGNALS 2.55 AVERAGE HER2 COPY NUMBER PER CELL 4.85   06/25/2017 Genetic Testing   Negative genetic testing on the 9 gene STAT panel.  The STAT Breast cancer panel offered by [facility] includes sequencing and rearrangement analysis for the following 9 genes:  ATM, BRCA1, BRCA2, CDH1, CHEK2, PALB2, PTEN, STK11 and TP53.   The report date is June 25, 2017.  Negative genetic testing on the common hereditary cancer panel.  The Hereditary Gene Panel offered by [facility] includes sequencing and/or deletion  duplication testing of the following 46 genes: APC, ATM, AXIN2, BARD1, BMPR1A, BRCA1, BRCA2, BRIP1, CDH1, CDKN2A (p14ARF), CDKN2A (p16INK4a), CHEK2, CTNNA1, DICER1, EPCAM (Deletion/duplication testing only), GREM1 (promoter region deletion/duplication testing only), KIT, MEN1, MLH1, MSH2, MSH3, MSH6, MUTYH, NBN, NF1, NHTL1, PALB2, PDGFRA, PMS2, POLD1, POLE, PTEN, RAD50, RAD51C, RAD51D, SDHB, SDHC, SDHD, SMAD4, SMARCA4. STK11, TP53, TSC1, TSC2, and VHL.  The following genes were evaluated for sequence changes only: SDHA and HOXB13 c.251G>A variant only.  The report date is June 25, 2017.    07/02/2017 Imaging   CT cAP 07/02/17 IMPRESSION: 1. Subareolar right breast mass. No compelling findings of nodal or metastatic involvement. 2. Old granulomatous disease. 3. There is a 7 mm enhancing or hyperdense focus posteriorly in segment 7 of the liver in the subcapsular region. By virtue of its small size this lesion is technically nonspecific although statistically likely to be a small benign lesions such as flash filling hemangioma. This may warrant surveillance. 4.  Prominent stool throughout the colon favors constipation. 5. Degenerative disc disease and spondylosis at L5-S1 likely causing mild impingement.   07/02/2017 Imaging   Bone scan 07/02/17 IMPRESSION: Today' s exam is negative. I ascribed the tiny focus of activity just proximal to the left antecubital region to injection site.   07/22/2017 Surgery   RIGHT TOTAL MASTECTOMY WITH RIGHT AXILLARY SENTINEL LYMPH NODE BIOPSY, LEFT PROPHYLACTIC MASTECTOM and INSERTION PORT-A-CATH WITH [name] by [name] and [facility]    07/22/2017 Pathology Results   Diagnosis 1. Breast, simple mastectomy, Left - FIBROADENOMA. - NO MALIGNANCY IDENTIFIED. 2. Breast, simple mastectomy, Right - MIXED INVASIVE LOBULAR AND DUCTAL  CARCINOMA, GRADE 2, SPANNING 5.3 CM. - ADDITIONAL FOCUS OF LOBULAR CARCINOMA, GRADE 2, SPANNING 2.2 CM. - INTERMEDIATE GRADE DUCTAL  CARCINOMA IN SITU. - INVASIVE CARCINOMA COMES TO WITHIN 0.2 TO 0.3 CM OF THE DEEP MARGIN, FOCALLY. - TUMOR FOCALLY INVOLVES EPIDERMIS. - LYMPHOVASCULAR INVASION OF DERMAL LYMPHATICS. - ONE OF ONE LYMPH NODES NEGATIVE FOR CARCINOMA (0/1). - SEE ONCOLOGY TABLE. 3. Lymph node, sentinel, biopsy, Right axillary - ONE LYMPH NODE WITH ISOLATED TUMOR CELLS (0/1). 4. Lymph node, sentinel, biopsy, Right - METASTATIC CARCINOMA IN ONE OF ONE LYMPH NODES (1/1). 5. Lymph node, sentinel, biopsy, Right - ONE OF ONE LYMPH NODES NEGATIVE FOR CARCINOMA (0/1). 6. Lymph node, sentinel, biopsy, Right - METASTATIC CARCINOMA IN ONE OF ONE LYMPH NODES (1/1). - EXTRACAPSULAR EXTENSION. 7. Lymph node, sentinel, biopsy, Right - METASTATIC CARCINOMA IN ONE OF ONE LYMPH NODES (1/1). - EXTRACAPSULAR EXTENSION. 8. Lymph node, sentinel, biopsy, Right - ONE OF ONE LYMPH NODES NEGATIVE FOR CARCINOMA (0/1). 9. Lymph node, sentinel, biopsy, Right - ONE OF ONE LYMPH NODES NEGATIVE FOR CARCINOMA (0/1). 10. Lymph node, sentinel, biopsy, Right - METASTATIC CARCINOMA IN ONE OF ONE LYMPH NODES (1/1). - EXTRACAPSULAR EXTENSION. 11. Lymph node, biopsy, Right axillary - ONE OF ONE LYMPH NODES NEGATIVE FOR CARCINOMA (0/1).    07/22/2017 Receptors her2   Ductal carcinoma in primary breast tumor and node metastasis are ER 95%, PR 95% strongly positive, and HER2 + The lobular component in primary breast tumor is ER 95% positive, PR 95% positive, strong staining, HER-2 negative.   Original biopsy [id number]: Ductal morphology, Her2 FISH positive. Current specimen: Main focus is 75% ductal morphology, 25% lobular morphology. Smaller focus is lobular morphology. #2A Lobular morphology, Her IHC negative. #2E Lobular morphology, Her2 FISH negative, Her2 IHC negative. #4 50% lobular and 50% ductal morphology, Her2 FISH positive, Her2 IHC negative. #6 Ductal morphology #7 Ductal morphology #10 50% lobular, 50% ductal morphology    08/25/2017 Imaging   MRI abdomen done at [facility]:  Enhancing structure measing 5 mm is compatible with a flash filling hemangioma. No specific findings to suggest metastatic cancer   08/25/2017 Imaging   MRI Abdomen at [facility] 08/25/17  IMPRESSION: 1. Lesion in segment 7 of the liver has signal and enhancement characteristics compatible with a flash filling hemangioma. No specific findings identified to suggest metastatic disease.   08/29/2017 - 12/12/2017 Chemotherapy   adjuvant TCHP every 3 weeks for 6 cycles starting 08/29/17. Postponed and decreased Carbo to 665m and added Onpro with cycle 2 on 09/19/17 due to neutropenic fever. Carbplatin further reduced to 5034mstarting with cycle 3 due to thrombocytopenia. Herceptin was held for Cycle 4 and 5 due to worsening global longitudinal strain found on ECHO from 11/07/17. Completed 6 cycles on 12/12/17      09/04/2017 - 09/09/2017 Hospital Admission   Admit date: 09/04/2017 Discharge date: 09/09/2017   DISCHARGE DIAGNOSES:  Principal Problem:   [facility] [place] Problems:   Malignant neoplasm of upper-outer quadrant of right breast in female, estrogen receptor positive ([place]  Port-A-Cath in place   Neutropenic fever ([facility]  Hyponatremia   Malignant neoplasm of breast in female, estrogen receptor positive ([place]    12/28/2017 Imaging   CT CHEST IMPRESSION: 1. 2.3 x 2.7 x 1.2 cm irregular masslike opacity within the right middle lobe worrisome for malignancy/metastasis. 2. 3 new very small sclerotic lesions, within the sternum and the body of T4, worrisome for new metastases.   12/28/2017 - 01/05/2018 Hospital Admission  Admit date: 12/28/17-01/05/18 Admission diagnosis: multifocal pneumonia, influenza A, sepsis, hypoxia Additional comments: imaging during hospitalization notable for new irregular masslike opacity in the right middle lobe worrisome for metastasis and new very small sclerotic lesion within the sternum and the body of  T4, worrisome for metastasis   12/29/2017 Imaging   CT ABD/PELVIS IMPRESSION: No definite acute intra-abdominal or intrapelvic abnormalities.   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   12/30/2017 Imaging   CT HEAD IMPRESSION: No acute intracranial abnormality or enhancing lesion.   01/16/2018 - 01/22/2019 Chemotherapy   Maintence Herceptin and Perjeta every 3 weeks for 6-12 months starting 01/16/18. Held 03/2018-07/17/18 due to low EF. Completed on 01/22/19    02/04/2018 PET scan   IMPRESSION: 1. The previous consolidation in the right middle lobe currently has a more nodular and reticular appearance with some associated mild atelectasis along its anterior margin. This demonstrates low-grade metabolic activity with maximum SUV 3.2 (background blood pool activity is 2.7). Although conceivably related to low-grade residuum from the patient's malignancy, the presence of calcified right hilar lymph nodes and small calcifications along the nodularity raise suspicion for a low-grade active granulomatous process or atypical infectious process. Surveillance is recommended. 2. Scattered deposits of hypermetabolic but benign metabolically active brown fat.   02/23/2018 - 04/09/2018 Radiation Therapy   Radiation treatment dates:   02/23/2018 - 04/09/2018   Site/dose:   The patient initially received a dose of 50.4 Gy in 28 fractions to the right chest wall and supraclavicular region. This was delivered using a 3-D conformal, 4 field technique. The patient then received a boost to the mastectomy scar. This delivered an additional 10 Gy in 5 fractions using an en face electron field. The total dose was 60.4 Gy.   Narrative: The patient tolerated radiation treatment relatively well.   The patient had some expected skin irritation as she progressed during treatment. Moist desquamation was not present at the end of treatment.   03/20/2018 -  Anti-estrogen oral therapy    -Monthly Zoladex injection starting 03/20/18  -Letrozole 2.4m once dialy starting 05/03/18  -Switched to Anastrozole starting 07/2018 Due to her worsening back pain. -Changed to Exemestane in 04/2019 due to back pain exacerbation.    04/06/2018 Imaging   CT Chest wo contrast IMPRESSION: 1. Stable linear nodular thickening in the RIGHT middle lobe is favored post infectious or inflammatory scarring. 2. Calcified RIGHT hilar lymph node is consistent post infectious process. 3. Stable small LEFT upper lobe nodule. 4. Stable sclerotic lesion in the manubrium. Sclerotic lesion at T3 is more prominent (4 mm). Sclerotic lesion at L 1 is new from 07/02/2017.   04/06/2018 Imaging   04/06/2018 CT Chest IMPRESSION: 1. Stable linear nodular thickening in the RIGHT middle lobe is favored post infectious or inflammatory scarring. 2. Calcified RIGHT hilar lymph node is consistent post infectious process. 3. Stable small LEFT upper lobe nodule. 4. Stable sclerotic lesion in the manubrium. Sclerotic lesion at T3 is more prominent (4 mm). Sclerotic lesion at L 1 is new from 07/02/2017.   04/10/2018 Echocardiogram   LV EF 45%-50%   05/25/2018 Echocardiogram   LV EF 45%-50%   07/13/2018 Echocardiogram   Normal LV size with EF 55-60%   07/31/2018 Imaging   07/31/2018 Bone Scan IMPRESSION: No evidence of skeletal metastatic disease.   07/31/2018 Imaging   07/31/2018 CT CAP IMPRESSION: Residual linear and nodular peribronchovascular opacities in the right middle lobe, with other scattered areas of ground-glass  opacities in the right middle, right upper and right lower lobes, which are located within the previously demonstrated bulky airspace consolidation. The findings are improved from the chest CT dated 12/30/2017, and relatively stable from PET-CT dated 02/04/2018. These may represent post infectious/post inflammatory changes or residual malignancy.   Stable partially calcified right hilar  lymphadenopathy.   Interval development of nodular soft tissue thickening within the anterior mediastinum in the area of the thymus, new from chest CT dated 12/30/2017. This may represent reactive thymic tissue, thymoma or lymphadenopathy.   1.8 cm probably physiologic left ovarian cyst. Borderline thickening of the endometrium measuring 15.6 mm. Further evaluation with pelvic ultrasound may be considered.   No new or suspicious findings within the abdomen.   09/15/2018 Echocardiogram   EF 55-60%   01/28/2019 Imaging   MRI Lumbar spie 01/28/19  IMPRESSION: 1. Subcentimeter lesions in the L1 and L2 vertebral bodies, indeterminate for metastases. 2. Mild lower lumbar disc degeneration without stenosis.   01/28/2019 Imaging   CT chest 01/28/19  IMPRESSION: Right lung post radiation changes. No active lung disease or other acute findings.   No evidence of recurrent or metastatic carcinoma within the thorax.   03/15/2019 - 02/2020 Chemotherapy   Anti-HER2 Neratinib 240 mg daily, starting 03/15/19. She completed 1 year of treatment in 02/2020     PAST MEDICAL HISTORY: Past Medical History:  Diagnosis Date   Anxiety    Cancer ([place])    right breast    Cardiomyopathy ([place])    Family history of breast cancer    Family history of colon cancer    GERD (gastroesophageal reflux disease)    Headache    couple of migraines in the past   History of kidney stones    Vitamin D deficiency     PAST SURGICAL HISTORY: Past Surgical History:  Procedure Laterality Date   egg donation     kidney stone removal     MASS EXCISION Right 08/19/2019   Procedure: REMOVE EXCESS TISSUE FROM RIGHT LATERAL MASTECTOMY SCAR;  Surgeon: [name], MD;  Location: [place];  Service: General;  Laterality: Right;   MASTECTOMY W/ SENTINEL NODE BIOPSY Bilateral 07/22/2017   Procedure: RIGHT TOTAL MASTECTOMY WITH RIGHT AXILLARY SENTINEL LYMPH NODE BIOPSY, LEFT PROPHYLACTIC MASTECTOMY;   Surgeon: [name], MD;  Location: [place];  Service: General;  Laterality: Bilateral;   PORT-A-CATH REMOVAL Right 08/19/2019   Procedure: REMOVAL PORT-A-CATH;  Surgeon: [name], MD;  Location: [place];  Service: General;  Laterality: Right;   PORTACATH PLACEMENT Right 07/22/2017   Procedure: INSERTION PORT-A-CATH WITH [name];  Surgeon: [name], MD;  Location: [place];  Service: General;  Laterality: Right;    OB/GYN HISTORY: OB History  Gravida Para Term Preterm AB Living  0 0 0 0 0 0  SAB IAB Ectopic Multiple Live Births  0 0 0 0 0      Age at menarche: 15 Age at menopause: 65 or 63 secondary to treatment of her breast cancer Hx of HRT: None; on hormone therapy for breast cancer Hx of STI: None Last pap: 2018 History of abnormal pap smears: Reports an abnormal Pap smear 20 years ago that was repeated 1 week later and normal (suspect this was an adequate sample); all normal since  SCREENING STUDIES:  Last mammogram: 06/2017 Last colonoscopy: None  MEDICATIONS:  Current Outpatient Medications:    B Complex-C (B-COMPLEX WITH VITAMIN C) tablet, Take 1 tablet by mouth daily., Disp: ,  Rfl:    calcium-vitamin D (OSCAL WITH D) 250-125 MG-UNIT tablet, Take 1 tablet by mouth daily., Disp: , Rfl:    cetirizine (ZYRTEC) 10 MG tablet, Take 10 mg by mouth daily., Disp: , Rfl:    Cholecalciferol (VITAMIN D) 2000 units tablet, Take 2,000 Units by mouth daily., Disp: , Rfl:    exemestane (AROMASIN) 25 MG tablet, TAKE 1 TABLET BY MOUTH EVERY DAY AFTER BREAKFAST, Disp: 90 tablet, Rfl: 3   fluticasone (FLONASE) 50 MCG/ACT nasal spray, Place into both nostrils daily., Disp: , Rfl:    venlafaxine XR (EFFEXOR-XR) 75 MG 24 hr capsule, TAKE 3 CAPSULES BY MOUTH EVERY DAY WITH BREAKFAST, Disp: 90 capsule, Rfl: 0  Current Facility-Administered Medications:    gi cocktail (Maalox,Lidocaine,Donnatal), 30 mL, Oral, Once, Tanner, Lyndon Code., PA-C  ALLERGIES: Allergies   Allergen Reactions   Eszopiclone     Other reaction(s): felt like a zombie    FAMILY HISTORY: Family History  Problem Relation Age of Onset   Breast cancer Mother 2   Osteoporosis Mother    Stroke Maternal Grandfather    Breast cancer Paternal Grandmother 65   Colon cancer Paternal Grandmother 62   Prostate cancer Paternal Grandfather        dx in his 98s   Leukemia Paternal Grandfather    Lung cancer Maternal Uncle        heavy smoker   Other Paternal Uncle        farm accident   Ovarian cancer Neg Hx     SOCIAL HISTORY: Social History   Socioeconomic History   Marital status: Single    Spouse name: Not on file   Number of children: Not on file   Years of education: Not on file   Highest education level: Not on file  Occupational History   Not on file  Tobacco Use   Smoking status: Never   Smokeless tobacco: Former  Scientific laboratory technician Use: Never used  Substance and Sexual Activity   Alcohol use: Yes    Comment: once a year   Drug use: No   Sexual activity: Yes  Other Topics Concern   Not on file  Social History Narrative   Not on file   Social Determinants of Health   Financial Resource Strain: Not on file  Food Insecurity: Not on file  Transportation Needs: Not on file  Physical Activity: Not on file  Stress: Not on file  Social Connections: Not on file  Intimate Partner Violence: Not on file    REVIEW OF SYSTEMS: New patient intake form was reviewed.  Complete 10-system review is negative except for the following: Hot flashes  PHYSICAL EXAM: BP 104/63 (BP Location: Left Arm, Patient Position: Sitting)   Pulse (!) 108   Temp 98.4 F (36.9 C) (Oral)   Resp 18   Ht 5' 5.75" (1.67 m)   Wt 166 lb (75.3 kg)   SpO2 100%   BMI 27.00 kg/m  Constitutional: No acute distress. Neuro/Psych: Alert, oriented.  Head and Neck: Normocephalic, atraumatic. Neck symmetric without masses. Sclera anicteric.  Respiratory: Normal work of breathing. Clear to  auscultation bilaterally. Cardiovascular: Regular rate and rhythm, no murmurs, rubs, or gallops. Abdomen: Normoactive bowel sounds. Soft, non-distended, non-tender to palpation. No masses or hepatosplenomegaly appreciated. No evidence of hernia. No palpable fluid wave.  Extremities: Grossly normal range of motion. Warm, well perfused. No edema bilaterally. Skin: No rashes or lesions. Lymphatic: No cervical, supraclavicular, or inguinal adenopathy. Genitourinary: External genitalia  without lesions. Urethral meatus without lesions or prolapse. On speculum exam, atrophic vagina and cervix without lesions. Bimanual exam reveals normal cervix, small mobile uterus and no adnexal masses or tenderness. Exam chaperoned by Joylene John, NP   LABORATORY AND RADIOLOGIC DATA: Outside medical records were reviewed to synthesize the above history, along with the history and physical obtained during the visit.  Outside laboratory and imaging reports were reviewed, with pertinent results below.  I personally reviewed the outside images.  WBC  Date Value Ref Range Status  08/09/2022 5.9 4.0 - 10.5 K/uL Final   Hemoglobin  Date Value Ref Range Status  08/09/2022 12.7 12.0 - 15.0 g/dL Final   HGB  Date Value Ref Range Status  11/21/2017 9.1 (L) 11.6 - 15.9 g/dL Final   HCT  Date Value Ref Range Status  08/09/2022 38.1 36.0 - 46.0 % Final  11/21/2017 27.5 (L) 34.8 - 46.6 % Final   Hematocrit  Date Value Ref Range Status  05/05/2020 35.9 34.0 - 46.6 % Final   Platelets  Date Value Ref Range Status  08/09/2022 241 150 - 400 K/uL Final  11/21/2017 162 145 - 400 10e3/uL Final   Magnesium  Date Value Ref Range Status  01/05/2018 1.9 1.7 - 2.4 mg/dL Final    Comment:    Performed at [facility], [place] [address], [place], [place] [address]   Creatinine  Date Value Ref Range Status  09/06/2021 0.97 0.44 - 1.00 mg/dL Final  11/21/2017 0.9 0.6 - 1.1 mg/dL Final   Creatinine, Ser   Date Value Ref Range Status  08/09/2022 1.10 (H) 0.44 - 1.00 mg/dL Final   AST  Date Value Ref Range Status  08/09/2022 14 (L) 15 - 41 U/L Final  09/06/2021 18 15 - 41 U/L Final  11/21/2017 23 5 - 34 U/L Final   ALT  Date Value Ref Range Status  08/09/2022 13 0 - 44 U/L Final  09/06/2021 14 0 - 44 U/L Final  11/21/2017 31 0 - 55 U/L Final    Lab Results  Component Value Date   CA2729 <9.0 08/09/2022   CA2729 <9.0 02/22/2022   CA2729 5.4 11/29/2021      US PELVIC COMPLETE WITH TRANSVAGINAL 08/13/2018  Narrative CLINICAL DATA:  Follow-up left ovarian cyst and endometrium from CT  EXAM: TRANSABDOMINAL AND TRANSVAGINAL ULTRASOUND OF PELVIS  TECHNIQUE: Both transabdominal and transvaginal ultrasound examinations of the pelvis were performed. Transabdominal technique was performed for global imaging of the pelvis including uterus, ovaries, adnexal regions, and pelvic cul-de-sac. It was necessary to proceed with endovaginal exam following the transabdominal exam to visualize the ovaries.  COMPARISON:  CT 07/31/2018  FINDINGS: Uterus  Measurements: 6.8 x 3.4 x 3.6 cm. No fibroids or other mass visualized.  Endometrium  Thickness: 8 mm in thickness.  No focal abnormality visualized.  Right ovary  Measurements: 3.4 x 1.7 x 2.1 cm. Normal appearance. Small follicle measures up to 1 cm  Left ovary  Measurements: 4.3 x 2.1 x 2.6 cm. Normal appearance. Small follicle measures up to 2 cm.  Other findings  No abnormal free fluid.  IMPRESSION: Normal pelvic ultrasound.   Electronically Signed By: Rolm Baptise M.D. On: 08/14/2018 09:20

## 2022-08-13 ENCOUNTER — Encounter: Payer: Self-pay | Admitting: Hematology

## 2022-08-13 ENCOUNTER — Encounter: Payer: Self-pay | Admitting: Psychiatry

## 2022-08-14 ENCOUNTER — Ambulatory Visit (HOSPITAL_COMMUNITY)
Admission: RE | Admit: 2022-08-14 | Discharge: 2022-08-14 | Disposition: A | Discharge status: Home / Self Care | Payer: BC Managed Care – PPO | Source: Ambulatory Visit | Attending: Psychiatry | Admitting: Psychiatry

## 2022-08-14 ENCOUNTER — Telehealth: Payer: Self-pay

## 2022-08-14 ENCOUNTER — Encounter: Payer: Self-pay | Admitting: Hematology

## 2022-08-14 DIAGNOSIS — R9389 Abnormal findings on diagnostic imaging of other specified body structures: Secondary | ICD-10-CM | POA: Insufficient documentation

## 2022-08-14 NOTE — Progress Notes (Signed)
Patient here for follow up and for a pre-operative discussion prior to her scheduled surgery on September 03, 2022. She is scheduled for robotic assisted laparoscopic bilateral salpingo-oophorectomy, possible endometrial biopsy, possible dilation and curettage. The surgery was discussed in detail.  See after visit summary for additional details. Visual aids used to discuss items related to surgery.   Discussed post-op pain management in detail including the aspects of the enhanced recovery pathway.  Advised her that a new prescription would be sent in for oxycodone and it is only to be used for after her upcoming surgery.  We discussed the use of tylenol post-op and to monitor for a maximum of 4,000 mg in a 24 hour period.  Also prescribed sennakot to be used after surgery and to hold if having loose stools.  Discussed bowel regimen in detail.     Discussed the use of SCDs and measures to take at home to prevent DVT including frequent mobility.  Reportable signs and symptoms of DVT discussed. Post-operative instructions discussed and expectations for after surgery. Incisional care discussed as well including reportable signs and symptoms including erythema, drainage, wound separation.     10 minutes spent with the patient.  Verbalizing understanding of material discussed. No needs or concerns voiced at the end of the visit.   Advised patient and family to call for any needs.  Advised that her post-operative medications had been prescribed and could be picked up at any time.    This appointment is included in the global surgical bundle as pre-operative teaching and has no charge.

## 2022-08-14 NOTE — Patient Instructions (Signed)
Preparing for your Surgery   Plan for surgery on September 03, 2022 with Dr. Bernadene Bell at [place] will be scheduled for robotic assisted laparoscopic bilateral salpingo-oophorectomy (removal of both ovaries and fallopian tubes), possible endometrial biopsy (biopsy from the lining of the uterus), possible dilation and curettage (scraping/sampling from the endometrium (lining of the uterus)).    Pre-operative Testing -You will receive a phone call from presurgical testing at [facility] to arrange for a pre-operative appointment and lab work.   -Bring your insurance card, copy of an advanced directive if applicable, medication list   -At that visit, you will be asked to sign a consent for a possible blood transfusion in case a transfusion becomes necessary during surgery.  The need for a blood transfusion is rare but having consent is a necessary part of your care.      -You should not be taking blood thinners or aspirin at least ten days prior to surgery unless instructed by your surgeon.   -Do not take supplements such as fish oil (omega 3), red yeast rice, turmeric before your surgery. You want to avoid medications with aspirin in them including headache powders such as BC or Goody's), Excedrin migraine.   Day Before Surgery at [place] will be asked to take in a light diet the day before surgery. You will be advised you can have clear liquids up until 3 hours before your surgery.     Eat a light diet the day before surgery.  Examples including soups, broths, toast, yogurt, mashed potatoes.  AVOID GAS PRODUCING FOODS. Things to avoid include carbonated beverages (fizzy beverages, sodas), raw fruits and raw vegetables (uncooked), or beans.    If your bowels are filled with gas, your surgeon will have difficulty visualizing your pelvic organs which increases your surgical risks.   Your role in recovery Your role is to become active as soon as directed by your  doctor, while still giving yourself time to heal.  Rest when you feel tired. You will be asked to do the following in order to speed your recovery:   - Cough and breathe deeply. This helps to clear and expand your lungs and can prevent pneumonia after surgery.  - [name]. Do mild physical activity. Walking or moving your legs help your circulation and body functions return to normal. Do not try to get up or walk alone the first time after surgery.   -If you develop swelling on one leg or the other, pain in the back of your leg, redness/warmth in one of your legs, please call the office or go to the Emergency Room to have a doppler to rule out a blood clot. For shortness of breath, chest pain-seek care in the Emergency Room as soon as possible. - Actively manage your pain. Managing your pain lets you move in comfort. We will ask you to rate your pain on a scale of zero to 10. It is your responsibility to tell your doctor or nurse where and how much you hurt so your pain can be treated.   Special Considerations -If you are diabetic, you may be placed on insulin after surgery to have closer control over your blood sugars to promote healing and recovery.  This does not mean that you will be discharged on insulin.  If applicable, your oral antidiabetics will be resumed when you are tolerating a solid diet.   -Your final pathology results from surgery should be  available around one week after surgery and the results will be relayed to you when available.   -Dr. Lahoma Crocker is the surgeon that assists your GYN Oncologist with surgery.  If you end up staying the night, the next day after your surgery you will either see Dr. Ernestina Patches, Dr. Berline Lopes, or Dr. Lahoma Crocker.   -FMLA forms can be faxed to [phone] and please allow 5-7 business days for completion.   Pain Management After Surgery -You have been prescribed your pain medication and bowel regimen medications before  surgery so that you can have these available when you are discharged from the hospital. The pain medication is for use ONLY AFTER surgery and a new prescription will not be given.    -Make sure that you have Tylenol and Ibuprofen IF YOU ARE ABLE TO TAKE THESE MEDICATIONS at home to use on a regular basis after surgery for pain control. We recommend alternating the medications every hour to six hours since they work differently and are processed in the body differently for pain relief.   -Review the attached handout on narcotic use and their risks and side effects.    Bowel Regimen -You have been prescribed Sennakot-S to take nightly to prevent constipation especially if you are taking the narcotic pain medication intermittently.  It is important to prevent constipation and drink adequate amounts of liquids. You can stop taking this medication when you are not taking pain medication and you are back on your normal bowel routine.   Risks of Surgery Risks of surgery are low but include bleeding, infection, damage to surrounding structures, re-operation, blood clots, and very rarely death.     Blood Transfusion Information (For the consent to be signed before surgery)   We will be checking your blood type before surgery so in case of emergencies, we will know what type of blood you would need.                                             WHAT IS A BLOOD TRANSFUSION?   A transfusion is the replacement of blood or some of its parts. Blood is made up of multiple cells which provide different functions. Red blood cells carry oxygen and are used for blood loss replacement. White blood cells fight against infection. Platelets control bleeding. Plasma helps clot blood. Other blood products are available for specialized needs, such as hemophilia or other clotting disorders. BEFORE THE TRANSFUSION  Who gives blood for transfusions?  You may be able to donate blood to be used at a later date on yourself  (autologous donation). Relatives can be asked to donate blood. This is generally not any safer than if you have received blood from a stranger. The same precautions are taken to ensure safety when a relative's blood is donated. Healthy volunteers who are fully evaluated to make sure their blood is safe. This is blood bank blood. Transfusion therapy is the safest it has ever been in the practice of medicine. Before blood is taken from a donor, a complete history is taken to make sure that person has no history of diseases nor engages in risky social behavior (examples are intravenous drug use or sexual activity with multiple partners). The donor's travel history is screened to minimize risk of transmitting infections, such as malaria. The donated blood is tested for signs of infectious diseases, such as  HIV and hepatitis. The blood is then tested to be sure it is compatible with you in order to minimize the chance of a transfusion reaction. If you or a relative donates blood, this is often done in anticipation of surgery and is not appropriate for emergency situations. It takes many days to process the donated blood. RISKS AND COMPLICATIONS Although transfusion therapy is very safe and saves many lives, the main dangers of transfusion include:  Getting an infectious disease. Developing a transfusion reaction. This is an allergic reaction to something in the blood you were given. Every precaution is taken to prevent this. The decision to have a blood transfusion has been considered carefully by your caregiver before blood is given. Blood is not given unless the benefits outweigh the risks.   AFTER SURGERY INSTRUCTIONS   Return to work: 4-6 weeks if applicable   Activity: 1. Be up and out of the bed during the day.  Take a nap if needed.  You may walk up steps but be careful and use the hand rail.  Stair climbing will tire you more than you think, you may need to stop part way and rest.    2. No  lifting or straining for 6 weeks over 10 pounds. No pushing, pulling, straining for 6 weeks.   3. No driving for around 1 week(s).  Do not drive if you are taking narcotic pain medicine and make sure that your reaction time has returned.    4. You can shower as soon as the next day after surgery. Shower daily.  Use your regular soap and water (not directly on the incision) and pat your incision(s) dry afterwards; don't rub.  No tub baths or submerging your body in water until cleared by your surgeon. If you have the soap that was given to you by pre-surgical testing that was used before surgery, you do not need to use it afterwards because this can irritate your incisions.    5. No sexual activity and nothing in the vagina for 4 weeks.   6. You may experience a small amount of clear drainage from your incisions, which is normal.  If the drainage persists, increases, or changes color please call the office.   7. Do not use creams, lotions, or ointments such as neosporin on your incisions after surgery until advised by your surgeon because they can cause removal of the dermabond glue on your incisions.     8. You may experience vaginal spotting after surgery.  The spotting is normal but if you experience heavy bleeding, call our office.   9. Take Tylenol or ibuprofen first for pain if you are able to take these medications and only use narcotic pain medication for severe pain not relieved by the Tylenol or Ibuprofen.  Monitor your Tylenol intake to a max of 4,000 mg in a 24 hour period. You can alternate these medications after surgery.   Diet: 1. Low sodium Heart Healthy Diet is recommended but you are cleared to resume your normal (before surgery) diet after your procedure.   2. It is safe to use a laxative, such as Miralax or Colace, if you have difficulty moving your bowels. You have been prescribed Sennakot-S to take at bedtime every evening after surgery to keep bowel movements regular and to  prevent constipation.     Wound Care: 1. Keep clean and dry.  Shower daily.   Reasons to call the Doctor: Fever - Oral temperature greater than 100.4 degrees Fahrenheit Foul-smelling  vaginal discharge Difficulty urinating Nausea and vomiting Increased pain at the site of the incision that is unrelieved with pain medicine. Difficulty breathing with or without chest pain New calf pain especially if only on one side Sudden, continuing increased vaginal bleeding with or without clots.   Contacts: For questions or concerns you should contact:   Dr. Bernadene Bell at [place], NP at [phone]   After Hours: call [phone] and have the GYN Oncologist paged/contacted (after 5 pm or on the weekends).   Messages sent via mychart are for non-urgent matters and are not responded to after hours so for urgent needs, please call the after hours number.

## 2022-08-14 NOTE — Telephone Encounter (Signed)
Pt is aware of Dr.Fengs recommendation regarding the Aromasin. Per Burr Medico, continue medication through surgery.  Pt voices an understanding.

## 2022-08-16 ENCOUNTER — Other Ambulatory Visit: Payer: Self-pay | Admitting: Gynecologic Oncology

## 2022-08-16 DIAGNOSIS — Z170 Estrogen receptor positive status [ER+]: Secondary | ICD-10-CM

## 2022-08-23 ENCOUNTER — Telehealth: Payer: Self-pay | Admitting: Hematology

## 2022-08-23 NOTE — Patient Instructions (Signed)
DUE TO COVID-19 ONLY TWO VISITORS  (aged 44 and older)  ARE ALLOWED TO COME WITH YOU AND STAY IN THE WAITING ROOM ONLY DURING PRE OP AND PROCEDURE.   **NO VISITORS ARE ALLOWED IN THE SHORT STAY AREA OR RECOVERY ROOM!!**  IF YOU WILL BE ADMITTED INTO THE HOSPITAL YOU ARE ALLOWED ONLY FOUR SUPPORT PEOPLE DURING VISITATION HOURS ONLY (7 AM -8PM)   The support person(s) must pass our screening, gel in and out, and wear a mask at all times, including in the patient's room. Patients must also wear a mask when staff or their support person are in the room. Visitors GUEST BADGE MUST BE WORN VISIBLY  One adult visitor may remain with you overnight and MUST be in the room by 8 P.M.     Your procedure is scheduled on: 09/03/22   Report to [facility] Main Entrance    Report to admitting at : 5:15 AM   Call this number if you have problems the morning of surgery [phone]   Eat a light diet the day before surgery.  Examples including soups, broths, toast, yogurt, mashed potatoes.  Things to avoid include carbonated beverages (fizzy beverages), raw fruits and raw vegetables, or beans.   If your bowels are filled with gas, your surgeon will have difficulty visualizing your pelvic organs which increases your surgical risks.   Do not eat food :After Midnight.   After Midnight you may have the following liquids until : 4:30 AM DAY OF SURGERY  Water Black Coffee (sugar ok, NO MILK/CREAM OR CREAMERS)  Tea (sugar ok, NO MILK/CREAM OR CREAMERS) regular and decaf                             Plain Jell-O (NO RED)                                           Fruit ices (not with fruit pulp, NO RED)                                     Popsicles (NO RED)                                                                  Juice: apple, WHITE grape, WHITE cranberry Sports drinks like Gatorade (NO RED)    Oral Hygiene is also important to reduce your risk of infection.                                     Remember - BRUSH YOUR TEETH THE MORNING OF SURGERY WITH YOUR REGULAR TOOTHPASTE   Do NOT smoke after Midnight   Take these medicines the morning of surgery with A SIP OF WATER: cetirizine,exemestane.Use Flonase as usual.                              You may not have any  metal on your body including hair pins, jewelry, and body piercing             Do not wear make-up, lotions, powders, perfumes/cologne, or deodorant  Do not wear nail polish including gel and S&S, artificial/acrylic nails, or any other type of covering on natural nails including finger and toenails. If you have artificial nails, gel coating, etc. that needs to be removed by a nail salon please have this removed prior to surgery or surgery may need to be canceled/ delayed if the surgeon/ anesthesia feels like they are unable to be safely monitored.   Do not shave  48 hours prior to surgery.   Do not bring valuables to the hospital. [place].   Contacts, dentures or bridgework may not be worn into surgery.   Bring small overnight bag day of surgery.   DO NOT [place]. PHARMACY WILL DISPENSE MEDICATIONS LISTED ON YOUR MEDICATION LIST TO YOU DURING YOUR ADMISSION [place]!    Patients discharged on the day of surgery will not be allowed to drive home.  Someone NEEDS to stay with you for the first 24 hours after anesthesia.   Special Instructions: Bring a copy of your healthcare power of attorney and living will documents         the day of surgery if you haven't scanned them before.              Please read over the following fact sheets you were given: IF YOU HAVE QUESTIONS ABOUT YOUR PRE-OP INSTRUCTIONS PLEASE CALL [phone]     [facility] - Preparing for Surgery Before surgery, you can play an important role.  Because skin is not sterile, your skin needs to be as free of germs as possible.  You can reduce the number of germs  on your skin by washing with CHG (chlorahexidine gluconate) soap before surgery.  CHG is an antiseptic cleaner which kills germs and bonds with the skin to continue killing germs even after washing. Please DO NOT use if you have an allergy to CHG or antibacterial soaps.  If your skin becomes reddened/irritated stop using the CHG and inform your nurse when you arrive at Short Stay. Do not shave (including legs and underarms) for at least 48 hours prior to the first CHG shower.  You may shave your face/neck. Please follow these instructions carefully:  1.  Shower with CHG Soap the night before surgery and the  morning of Surgery.  2.  If you choose to wash your hair, wash your hair first as usual with your  normal  shampoo.  3.  After you shampoo, rinse your hair and body thoroughly to remove the  shampoo.                           4.  Use CHG as you would any other liquid soap.  You can apply chg directly  to the skin and wash                       Gently with a scrungie or clean washcloth.  5.  Apply the CHG Soap to your body ONLY FROM THE NECK DOWN.   Do not use on face/ open  Wound or open sores. Avoid contact with eyes, ears mouth and genitals (private parts).                       Wash face,  Genitals (private parts) with your normal soap.             6.  Wash thoroughly, paying special attention to the area where your surgery  will be performed.  7.  Thoroughly rinse your body with warm water from the neck down.  8.  DO NOT shower/wash with your normal soap after using and rinsing off  the CHG Soap.                9.  Pat yourself dry with a clean towel.            10.  Wear clean pajamas.            11.  Place clean sheets on your bed the night of your first shower and do not  sleep with pets. Day of Surgery : Do not apply any lotions/deodorants the morning of surgery.  Please wear clean clothes to the hospital/surgery center.  FAILURE TO FOLLOW THESE INSTRUCTIONS  MAY RESULT IN THE CANCELLATION OF YOUR SURGERY PATIENT SIGNATURE_________________________________  NURSE SIGNATURE__________________________________  ________________________________________________________________________  WHAT IS A BLOOD TRANSFUSION? Blood Transfusion Information  A transfusion is the replacement of blood or some of its parts. Blood is made up of multiple cells which provide different functions. Red blood cells carry oxygen and are used for blood loss replacement. White blood cells fight against infection. Platelets control bleeding. Plasma helps clot blood. Other blood products are available for specialized needs, such as hemophilia or other clotting disorders. BEFORE THE TRANSFUSION  Who gives blood for transfusions?  Healthy volunteers who are fully evaluated to make sure their blood is safe. This is blood bank blood. Transfusion therapy is the safest it has ever been in the practice of medicine. Before blood is taken from a donor, a complete history is taken to make sure that person has no history of diseases nor engages in risky social behavior (examples are intravenous drug use or sexual activity with multiple partners). The donor's travel history is screened to minimize risk of transmitting infections, such as malaria. The donated blood is tested for signs of infectious diseases, such as HIV and hepatitis. The blood is then tested to be sure it is compatible with you in order to minimize the chance of a transfusion reaction. If you or a relative donates blood, this is often done in anticipation of surgery and is not appropriate for emergency situations. It takes many days to process the donated blood. RISKS AND COMPLICATIONS Although transfusion therapy is very safe and saves many lives, the main dangers of transfusion include:  Getting an infectious disease. Developing a transfusion reaction. This is an allergic reaction to something in the blood you were given. Every  precaution is taken to prevent this. The decision to have a blood transfusion has been considered carefully by your caregiver before blood is given. Blood is not given unless the benefits outweigh the risks. AFTER THE TRANSFUSION Right after receiving a blood transfusion, you will usually feel much better and more energetic. This is especially true if your red blood cells have gotten low (anemic). The transfusion raises the level of the red blood cells which carry oxygen, and this usually causes an energy increase. The nurse administering the transfusion will monitor you carefully for  complications. HOME CARE INSTRUCTIONS  No special instructions are needed after a transfusion. You may find your energy is better. Speak with your caregiver about any limitations on activity for underlying diseases you may have. SEEK MEDICAL CARE IF:  Your condition is not improving after your transfusion. You develop redness or irritation at the intravenous (IV) site. SEEK IMMEDIATE MEDICAL CARE IF:  Any of the following symptoms occur over the next 12 hours: Shaking chills. You have a temperature by mouth above 102 F (38.9 C), not controlled by medicine. Chest, back, or muscle pain. People around you feel you are not acting correctly or are confused. Shortness of breath or difficulty breathing. Dizziness and fainting. You get a rash or develop hives. You have a decrease in urine output. Your urine turns a dark color or changes to pink, red, or brown. Any of the following symptoms occur over the next 10 days: You have a temperature by mouth above 102 F (38.9 C), not controlled by medicine. Shortness of breath. Weakness after normal activity. The white part of the eye turns yellow (jaundice). You have a decrease in the amount of urine or are urinating less often. Your urine turns a dark color or changes to pink, red, or brown. Document Released: 11/01/2000 Document Revised: 01/27/2012 Document Reviewed:  06/20/2008 [facility] Patient Information 2014 [place], [place].  _______________________________________________________________________

## 2022-08-23 NOTE — Telephone Encounter (Signed)
Scheduled follow-up appointment per 9/22 los. Patient is aware. Patient declined to schedule injections.

## 2022-08-25 ENCOUNTER — Other Ambulatory Visit: Payer: Self-pay | Admitting: Hematology

## 2022-08-27 ENCOUNTER — Ambulatory Visit: Payer: BC Managed Care – PPO | Admitting: Gynecologic Oncology

## 2022-08-27 ENCOUNTER — Encounter: Payer: Self-pay | Admitting: Hematology

## 2022-08-27 ENCOUNTER — Other Ambulatory Visit: Payer: Self-pay

## 2022-08-27 ENCOUNTER — Encounter (HOSPITAL_COMMUNITY)
Admission: RE | Admit: 2022-08-27 | Discharge: 2022-08-27 | Disposition: A | Discharge status: Home / Self Care | Payer: BC Managed Care – PPO | Source: Ambulatory Visit | Attending: Psychiatry | Admitting: Psychiatry

## 2022-08-27 ENCOUNTER — Encounter (HOSPITAL_COMMUNITY): Payer: Self-pay

## 2022-08-27 VITALS — BP 102/71 | HR 98 | Temp 98.2°F | Ht 66.0 in | Wt 162.0 lb

## 2022-08-27 DIAGNOSIS — R000 Tachycardia, unspecified: Secondary | ICD-10-CM | POA: Insufficient documentation

## 2022-08-27 DIAGNOSIS — Z170 Estrogen receptor positive status [ER+]: Secondary | ICD-10-CM | POA: Diagnosis not present

## 2022-08-27 DIAGNOSIS — Z01818 Encounter for other preprocedural examination: Secondary | ICD-10-CM | POA: Insufficient documentation

## 2022-08-27 DIAGNOSIS — C50411 Malignant neoplasm of upper-outer quadrant of right female breast: Secondary | ICD-10-CM | POA: Insufficient documentation

## 2022-08-27 HISTORY — DX: Depression, unspecified: F32.A

## 2022-08-27 HISTORY — DX: Pneumonia, unspecified organism: J18.9

## 2022-08-27 LAB — CBC
HCT: 37 % (ref 36.0–46.0)
Hemoglobin: 12 g/dL (ref 12.0–15.0)
MCH: 29.3 pg (ref 26.0–34.0)
MCHC: 32.4 g/dL (ref 30.0–36.0)
MCV: 90.5 fL (ref 80.0–100.0)
Platelets: 214 10*3/uL (ref 150–400)
RBC: 4.09 MIL/uL (ref 3.87–5.11)
RDW: 13.2 % (ref 11.5–15.5)
WBC: 4 10*3/uL (ref 4.0–10.5)
nRBC: 0 % (ref 0.0–0.2)

## 2022-08-27 LAB — COMPREHENSIVE METABOLIC PANEL
ALT: 13 U/L (ref 0–44)
AST: 18 U/L (ref 15–41)
Albumin: 3.9 g/dL (ref 3.5–5.0)
Alkaline Phosphatase: 88 U/L (ref 38–126)
Anion gap: 7 (ref 5–15)
BUN: 14 mg/dL (ref 6–20)
CO2: 24 mmol/L (ref 22–32)
Calcium: 9.2 mg/dL (ref 8.9–10.3)
Chloride: 108 mmol/L (ref 98–111)
Creatinine, Ser: 0.84 mg/dL (ref 0.44–1.00)
GFR, Estimated: >60 mL/min (ref >60)
Glucose, Bld: 129 mg/dL — ABNORMAL HIGH (ref 70–99)
Potassium: 3.7 mmol/L (ref 3.5–5.1)
Sodium: 139 mmol/L (ref 135–145)
Total Bilirubin: 0.6 mg/dL (ref 0.3–1.2)
Total Protein: 7.1 g/dL (ref 6.5–8.1)

## 2022-08-27 NOTE — Progress Notes (Signed)
For Short Stay: [place] appointment date: Date of COVID positive in last 62 days:  Bowel Prep reminder:   For Anesthesia: PCP - Jillyn Ledger: FNP Cardiologist - Dr. Loralie Champagne: LOV: 09/15/18  Chest x-ray -  EKG -  Stress Test -  ECHO - 03/18/19 Cardiac Cath -  Pacemaker/ICD device last checked: Pacemaker orders received: Device Rep notified:  Spinal Cord Stimulator:  Sleep Study -  CPAP -   Fasting Blood Sugar -  Checks Blood Sugar _____ times a day Date and result of last Hgb A1c-  Blood Thinner Instructions: Aspirin Instructions: Last Dose:  Activity level: Can go up a flight of stairs and activities of daily living without stopping and without chest pain and/or shortness of breath   Able to exercise without chest pain and/or shortness of breath   Unable to go up a flight of stairs without chest pain and/or shortness of breath     Anesthesia review: Hx: Cardiomyopathy.  Patient denies shortness of breath, fever, cough and chest pain at PAT appointment   Patient verbalized understanding of instructions that were given to them at the PAT appointment. Patient was also instructed that they will need to review over the PAT instructions again at home before surgery.

## 2022-08-28 ENCOUNTER — Other Ambulatory Visit: Payer: Self-pay

## 2022-08-28 MED ORDER — VENLAFAXINE HCL ER 75 MG PO CP24: ORAL_CAPSULE | ORAL | 0 refills | Status: DC

## 2022-09-02 ENCOUNTER — Encounter (HOSPITAL_COMMUNITY): Payer: Self-pay | Admitting: Psychiatry

## 2022-09-02 ENCOUNTER — Telehealth: Payer: Self-pay

## 2022-09-02 NOTE — Anesthesia Preprocedure Evaluation (Addendum)
Anesthesia Evaluation  Patient identified by MRN, date of birth, ID band Patient awake    Reviewed: Allergy & Precautions, NPO status , Patient's Chart, lab work & pertinent test results  Airway Mallampati: II  TM Distance: >3 FB Neck ROM: Full    Dental no notable dental hx. 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  Renal/GU negative Renal ROS  negative genitourinary   Musculoskeletal negative musculoskeletal ROS (+)   Abdominal   Peds  Hematology negative hematology ROS (+)   Anesthesia Other Findings   Reproductive/Obstetrics negative OB ROS                            Anesthesia Physical Anesthesia Plan  ASA: 2  Anesthesia Plan: General   Post-op Pain Management: Tylenol PO (pre-op)*, Precedex and Dilaudid IV   Induction:   PONV Risk Score and Plan: 4 or greater and Treatment may vary due to age or medical condition, Midazolam, Scopolamine patch - Pre-op, Ondansetron and Dexamethasone  Airway Management Planned: Oral ETT  Additional Equipment: None  Intra-op Plan:   Post-operative Plan: Extubation in OR  Informed Consent: I have reviewed the patients History and Physical, chart, labs and discussed the procedure including the risks, benefits and alternatives for the proposed anesthesia with the patient or authorized representative who has indicated his/her understanding and acceptance.     Dental advisory given  Plan Discussed with: CRNA and Anesthesiologist  Anesthesia Plan Comments:         Anesthesia Quick Evaluation

## 2022-09-02 NOTE — Telephone Encounter (Signed)
Telephone call to check on pre-operative status.  Patient compliant with pre-operative instructions.  Reinforced nothing to eat after midnight. Clear liquids until 0430. Patient to arrive at 0515.  No questions or concerns voiced.  Instructed to call for any needs.  ?

## 2022-09-03 ENCOUNTER — Ambulatory Visit (HOSPITAL_COMMUNITY)
Admission: RE | Admit: 2022-09-03 | Discharge: 2022-09-03 | Disposition: A | Discharge status: Home / Self Care | Payer: BC Managed Care – PPO | Attending: Psychiatry | Admitting: Psychiatry

## 2022-09-03 ENCOUNTER — Ambulatory Visit (HOSPITAL_COMMUNITY): Payer: BC Managed Care – PPO | Admitting: Certified Registered Nurse Anesthetist

## 2022-09-03 ENCOUNTER — Ambulatory Visit (HOSPITAL_COMMUNITY): Payer: BC Managed Care – PPO | Admitting: Physician Assistant

## 2022-09-03 ENCOUNTER — Encounter (HOSPITAL_COMMUNITY): Payer: Self-pay | Admitting: Psychiatry

## 2022-09-03 ENCOUNTER — Encounter (HOSPITAL_COMMUNITY)
Admission: RE | Disposition: A | Discharge status: Home / Self Care | Payer: Self-pay | Source: Home / Self Care | Attending: Psychiatry

## 2022-09-03 ENCOUNTER — Other Ambulatory Visit: Payer: Self-pay

## 2022-09-03 DIAGNOSIS — K219 Gastro-esophageal reflux disease without esophagitis: Secondary | ICD-10-CM | POA: Insufficient documentation

## 2022-09-03 DIAGNOSIS — Z170 Estrogen receptor positive status [ER+]: Secondary | ICD-10-CM | POA: Diagnosis not present

## 2022-09-03 DIAGNOSIS — Z9013 Acquired absence of bilateral breasts and nipples: Secondary | ICD-10-CM | POA: Insufficient documentation

## 2022-09-03 DIAGNOSIS — C50919 Malignant neoplasm of unspecified site of unspecified female breast: Secondary | ICD-10-CM | POA: Diagnosis not present

## 2022-09-03 DIAGNOSIS — Z4002 Encounter for prophylactic removal of ovary: Secondary | ICD-10-CM | POA: Diagnosis not present

## 2022-09-03 DIAGNOSIS — Z79811 Long term (current) use of aromatase inhibitors: Secondary | ICD-10-CM | POA: Diagnosis not present

## 2022-09-03 DIAGNOSIS — C50411 Malignant neoplasm of upper-outer quadrant of right female breast: Secondary | ICD-10-CM

## 2022-09-03 DIAGNOSIS — Z87891 Personal history of nicotine dependence: Secondary | ICD-10-CM | POA: Diagnosis not present

## 2022-09-03 HISTORY — PX: ROBOTIC ASSISTED BILATERAL SALPINGO OOPHERECTOMY: SHX6078

## 2022-09-03 LAB — TYPE AND SCREEN
ABO/RH(D): A POS
Antibody Screen: NEGATIVE

## 2022-09-03 LAB — POCT PREGNANCY, URINE: Preg Test, Ur: NEGATIVE

## 2022-09-03 SURGERY — SALPINGO-OOPHORECTOMY, BILATERAL, ROBOT-ASSISTED
Anesthesia: General | Laterality: Bilateral

## 2022-09-03 MED ORDER — AMISULPRIDE (ANTIEMETIC) 5 MG/2ML IV SOLN: 10.0000 mg | Freq: Once | INTRAVENOUS | Status: DC | PRN

## 2022-09-03 MED ORDER — FENTANYL CITRATE (PF) 100 MCG/2ML IJ SOLN
INTRAMUSCULAR | Status: DC | PRN
  Administered 2022-09-03: 100 ug via INTRAVENOUS

## 2022-09-03 MED ORDER — HYDROMORPHONE HCL 1 MG/ML IJ SOLN: 0.2500 mg | INTRAMUSCULAR | Status: DC | PRN

## 2022-09-03 MED ORDER — BUPIVACAINE HCL 0.25 % IJ SOLN
INTRAMUSCULAR | Status: DC | PRN
  Administered 2022-09-03: 15 mL

## 2022-09-03 MED ORDER — DEXAMETHASONE SODIUM PHOSPHATE 10 MG/ML IJ SOLN
INTRAMUSCULAR | Status: AC
  Filled 2022-09-03: qty 1

## 2022-09-03 MED ORDER — ONDANSETRON HCL 4 MG/2ML IJ SOLN
INTRAMUSCULAR | Status: AC
  Filled 2022-09-03: qty 2

## 2022-09-03 MED ORDER — ORAL CARE MOUTH RINSE: 15.0000 mL | Freq: Once | OROMUCOSAL | Status: AC

## 2022-09-03 MED ORDER — LIDOCAINE 2% (20 MG/ML) 5 ML SYRINGE
INTRAMUSCULAR | Status: DC | PRN
  Administered 2022-09-03: 60 mg via INTRAVENOUS

## 2022-09-03 MED ORDER — PROPOFOL 10 MG/ML IV BOLUS
INTRAVENOUS | Status: DC | PRN
  Administered 2022-09-03: 150 mg via INTRAVENOUS

## 2022-09-03 MED ORDER — MIDAZOLAM HCL 5 MG/5ML IJ SOLN
INTRAMUSCULAR | Status: DC | PRN
  Administered 2022-09-03: 2 mg via INTRAVENOUS

## 2022-09-03 MED ORDER — ONDANSETRON HCL 4 MG/2ML IJ SOLN
INTRAMUSCULAR | Status: DC | PRN
  Administered 2022-09-03: 4 mg via INTRAVENOUS

## 2022-09-03 MED ORDER — PROPOFOL 1000 MG/100ML IV EMUL
INTRAVENOUS | Status: AC
  Filled 2022-09-03: qty 100

## 2022-09-03 MED ORDER — BUPIVACAINE HCL 0.25 % IJ SOLN
INTRAMUSCULAR | Status: AC
  Filled 2022-09-03: qty 1

## 2022-09-03 MED ORDER — CELECOXIB 200 MG PO CAPS
200.0000 mg | ORAL_CAPSULE | ORAL | Status: AC
  Administered 2022-09-03: 200 mg via ORAL
  Filled 2022-09-03: qty 1

## 2022-09-03 MED ORDER — MIDAZOLAM HCL 2 MG/2ML IJ SOLN
INTRAMUSCULAR | Status: AC
  Filled 2022-09-03: qty 2

## 2022-09-03 MED ORDER — LACTATED RINGERS IR SOLN
Status: DC | PRN
  Administered 2022-09-03: 1000 mL

## 2022-09-03 MED ORDER — HEPARIN SODIUM (PORCINE) 5000 UNIT/ML IJ SOLN
5000.0000 [IU] | INTRAMUSCULAR | Status: AC
  Administered 2022-09-03: 5000 [IU] via SUBCUTANEOUS
  Filled 2022-09-03: qty 1

## 2022-09-03 MED ORDER — CHLORHEXIDINE GLUCONATE 0.12 % MT SOLN
15.0000 mL | Freq: Once | OROMUCOSAL | Status: AC
  Administered 2022-09-03: 15 mL via OROMUCOSAL

## 2022-09-03 MED ORDER — LIDOCAINE HCL (PF) 2 % IJ SOLN
INTRAMUSCULAR | Status: AC
  Filled 2022-09-03: qty 5

## 2022-09-03 MED ORDER — SUGAMMADEX SODIUM 200 MG/2ML IV SOLN
INTRAVENOUS | Status: DC | PRN
  Administered 2022-09-03: 200 mg via INTRAVENOUS

## 2022-09-03 MED ORDER — PROPOFOL 500 MG/50ML IV EMUL
INTRAVENOUS | Status: DC | PRN
  Administered 2022-09-03: 25 ug/kg/min via INTRAVENOUS

## 2022-09-03 MED ORDER — HYDROMORPHONE HCL 2 MG/ML IJ SOLN
INTRAMUSCULAR | Status: AC
  Filled 2022-09-03: qty 1

## 2022-09-03 MED ORDER — HYDROMORPHONE HCL 1 MG/ML IJ SOLN
INTRAMUSCULAR | Status: DC | PRN
  Administered 2022-09-03: 1 mg via INTRAVENOUS
  Administered 2022-09-03: .5 mg via INTRAVENOUS

## 2022-09-03 MED ORDER — ROCURONIUM BROMIDE 10 MG/ML (PF) SYRINGE
PREFILLED_SYRINGE | INTRAVENOUS | Status: DC | PRN
  Administered 2022-09-03: 70 mg via INTRAVENOUS

## 2022-09-03 MED ORDER — PHENYLEPHRINE 80 MCG/ML (10ML) SYRINGE FOR IV PUSH (FOR BLOOD PRESSURE SUPPORT)
PREFILLED_SYRINGE | INTRAVENOUS | Status: DC | PRN
  Administered 2022-09-03 (×3): 120 ug via INTRAVENOUS

## 2022-09-03 MED ORDER — DEXAMETHASONE SODIUM PHOSPHATE 4 MG/ML IJ SOLN
4.0000 mg | INTRAMUSCULAR | Status: AC
  Administered 2022-09-03: 4 mg via INTRAVENOUS

## 2022-09-03 MED ORDER — ONDANSETRON HCL 4 MG/2ML IJ SOLN: 4.0000 mg | Freq: Once | INTRAMUSCULAR | Status: DC | PRN

## 2022-09-03 MED ORDER — ROCURONIUM BROMIDE 10 MG/ML (PF) SYRINGE
PREFILLED_SYRINGE | INTRAVENOUS | Status: AC
  Filled 2022-09-03: qty 10

## 2022-09-03 MED ORDER — ACETAMINOPHEN 500 MG PO TABS
1000.0000 mg | ORAL_TABLET | ORAL | Status: AC
  Administered 2022-09-03: 1000 mg via ORAL
  Filled 2022-09-03: qty 2

## 2022-09-03 MED ORDER — OXYCODONE HCL 5 MG/5ML PO SOLN: 5.0000 mg | Freq: Once | ORAL | Status: DC | PRN

## 2022-09-03 MED ORDER — DEXMEDETOMIDINE HCL IN NACL 80 MCG/20ML IV SOLN
INTRAVENOUS | Status: DC | PRN
  Administered 2022-09-03 (×3): 4 ug via BUCCAL

## 2022-09-03 MED ORDER — FENTANYL CITRATE (PF) 100 MCG/2ML IJ SOLN
INTRAMUSCULAR | Status: AC
  Filled 2022-09-03: qty 2

## 2022-09-03 MED ORDER — OXYCODONE HCL 5 MG PO TABS: 5.0000 mg | ORAL_TABLET | Freq: Once | ORAL | Status: DC | PRN

## 2022-09-03 MED ORDER — DEXMEDETOMIDINE HCL IN NACL 80 MCG/20ML IV SOLN
INTRAVENOUS | Status: AC
  Filled 2022-09-03: qty 20

## 2022-09-03 MED ORDER — STERILE WATER FOR IRRIGATION IR SOLN
Status: DC | PRN
  Administered 2022-09-03: 1000 mL

## 2022-09-03 MED ORDER — SCOPOLAMINE 1 MG/3DAYS TD PT72
1.0000 | MEDICATED_PATCH | TRANSDERMAL | Status: DC
  Administered 2022-09-03: 1.5 mg via TRANSDERMAL
  Filled 2022-09-03: qty 1

## 2022-09-03 MED ORDER — GABAPENTIN 300 MG PO CAPS
300.0000 mg | ORAL_CAPSULE | ORAL | Status: AC
  Administered 2022-09-03: 300 mg via ORAL
  Filled 2022-09-03: qty 1

## 2022-09-03 MED ORDER — LACTATED RINGERS IV SOLN: INTRAVENOUS | Status: DC

## 2022-09-03 SURGICAL SUPPLY — 67 items
APPLICATOR SURGIFLO ENDO (HEMOSTASIS) IMPLANT
BAG LAPAROSCOPIC 12 15 PORT 16 (BASKET) IMPLANT
BAG RETRIEVAL 12/15 (BASKET)
BLADE SURG SZ10 CARB STEEL (BLADE) IMPLANT
COVER BACK TABLE 60X90IN (DRAPES) ×2 IMPLANT
COVER TIP SHEARS 8 DVNC (MISCELLANEOUS) ×2 IMPLANT
COVER TIP SHEARS 8MM DA VINCI (MISCELLANEOUS) ×2
DERMABOND ADVANCED .7 DNX12 (GAUZE/BANDAGES/DRESSINGS) ×2 IMPLANT
DRAPE ARM DVNC X/XI (DISPOSABLE) ×8 IMPLANT
DRAPE COLUMN DVNC XI (DISPOSABLE) ×2 IMPLANT
DRAPE DA VINCI XI ARM (DISPOSABLE) ×8
DRAPE DA VINCI XI COLUMN (DISPOSABLE) ×2
DRAPE SHEET LG 3/4 BI-LAMINATE (DRAPES) ×2 IMPLANT
DRAPE SURG IRRIG POUCH 19X23 (DRAPES) ×2 IMPLANT
DRSG OPSITE POSTOP 4X6 (GAUZE/BANDAGES/DRESSINGS) IMPLANT
DRSG OPSITE POSTOP 4X8 (GAUZE/BANDAGES/DRESSINGS) IMPLANT
ELECT PENCIL ROCKER SW 15FT (MISCELLANEOUS) IMPLANT
ELECT REM PT RETURN 15FT ADLT (MISCELLANEOUS) ×2 IMPLANT
GAUZE 4X4 16PLY ~~LOC~~+RFID DBL (SPONGE) ×2 IMPLANT
GLOVE BIO SURGEON STRL SZ 6 (GLOVE) ×8 IMPLANT
GLOVE BIO SURGEON STRL SZ 6.5 (GLOVE) ×2 IMPLANT
GOWN STRL REUS W/ TWL LRG LVL3 (GOWN DISPOSABLE) ×8 IMPLANT
GOWN STRL REUS W/TWL LRG LVL3 (GOWN DISPOSABLE) ×8
GRASPER SUT TROCAR 14GX15 (MISCELLANEOUS) IMPLANT
HOLDER FOLEY CATH W/STRAP (MISCELLANEOUS) IMPLANT
IRRIG SUCT STRYKERFLOW 2 WTIP (MISCELLANEOUS) ×2
IRRIGATION SUCT STRKRFLW 2 WTP (MISCELLANEOUS) ×2 IMPLANT
KIT TURNOVER KIT A (KITS) IMPLANT
LIGASURE IMPACT 36 18CM CVD LR (INSTRUMENTS) IMPLANT
MANIPULATOR ADVINCU DEL 3.0 PL (MISCELLANEOUS) IMPLANT
MANIPULATOR ADVINCU DEL 3.5 PL (MISCELLANEOUS) IMPLANT
MANIPULATOR UTERINE 4.5 ZUMI (MISCELLANEOUS) ×1 IMPLANT
NDL HYPO 21X1.5 SAFETY (NEEDLE) ×1 IMPLANT
NEEDLE HYPO 21X1.5 SAFETY (NEEDLE) ×2 IMPLANT
OBTURATOR OPTICAL STANDARD 8MM (TROCAR) ×2
OBTURATOR OPTICAL STND 8 DVNC (TROCAR) ×2
OBTURATOR OPTICALSTD 8 DVNC (TROCAR) ×2 IMPLANT
PACK ROBOT GYN CUSTOM WL (TRAY / TRAY PROCEDURE) ×2 IMPLANT
PAD POSITIONING PINK XL (MISCELLANEOUS) ×2 IMPLANT
PORT ACCESS TROCAR AIRSEAL 12 (TROCAR) ×2 IMPLANT
PORT ACCESS TROCAR AIRSEAL 5M (TROCAR) ×2
SEAL CANN UNIV 5-8 DVNC XI (MISCELLANEOUS) ×8 IMPLANT
SEAL XI 5MM-8MM UNIVERSAL (MISCELLANEOUS) ×8
SET TRI-LUMEN FLTR TB AIRSEAL (TUBING) ×2 IMPLANT
SOL PREP POV-IOD 4OZ 10% (MISCELLANEOUS) ×3 IMPLANT
SPIKE FLUID TRANSFER (MISCELLANEOUS) ×2 IMPLANT
SPONGE T-LAP 18X18 ~~LOC~~+RFID (SPONGE) IMPLANT
SURGIFLO W/THROMBIN 8M KIT (HEMOSTASIS) IMPLANT
SUT MNCRL AB 4-0 PS2 18 (SUTURE) IMPLANT
SUT PDS AB 1 TP1 96 (SUTURE) IMPLANT
SUT VIC AB 0 CT1 27 (SUTURE)
SUT VIC AB 0 CT1 27XBRD ANTBC (SUTURE) IMPLANT
SUT VIC AB 2-0 CT1 27 (SUTURE)
SUT VIC AB 2-0 CT1 TAPERPNT 27 (SUTURE) IMPLANT
SUT VIC AB 4-0 PS2 18 (SUTURE) ×4 IMPLANT
SUT VLOC 180 0 9IN  GS21 (SUTURE)
SUT VLOC 180 0 9IN GS21 (SUTURE) IMPLANT
SYS BAG RETRIEVAL 10MM (BASKET)
SYS WOUND ALEXIS 18CM MED (MISCELLANEOUS)
SYSTEM BAG RETRIEVAL 10MM (BASKET) IMPLANT
SYSTEM WOUND ALEXIS 18CM MED (MISCELLANEOUS) IMPLANT
TOWEL OR NON WOVEN STRL DISP B (DISPOSABLE) IMPLANT
TRAP SPECIMEN MUCUS 40CC (MISCELLANEOUS) IMPLANT
TRAY FOLEY MTR SLVR 16FR STAT (SET/KITS/TRAYS/PACK) ×2 IMPLANT
UNDERPAD 30X36 HEAVY ABSORB (UNDERPADS AND DIAPERS) ×4 IMPLANT
WATER STERILE IRR 1000ML POUR (IV SOLUTION) ×2 IMPLANT
YANKAUER SUCT BULB TIP 10FT TU (MISCELLANEOUS) IMPLANT

## 2022-09-03 NOTE — Transfer of Care (Signed)
Immediate Anesthesia Transfer of Care Note  Patient: Sheila [name]  Procedure(s) Performed: XI ROBOTIC ASSISTED BILATERAL SALPINGO OOPHORECTOMY (Bilateral)  Patient Location: PACU  Anesthesia Type:General  Level of Consciousness: awake and patient cooperative  Airway & Oxygen Therapy: Patient Spontanous Breathing and Patient connected to face mask  Post-op Assessment: Report given to RN and Post -op Vital signs reviewed and stable  Post vital signs: Reviewed and stable  Last Vitals:  Vitals Value Taken Time  BP 128/81 09/03/22 0915  Temp    Pulse 89 09/03/22 0917  Resp 6 09/03/22 0917  SpO2 100 % 09/03/22 0917  Vitals shown include unvalidated device data.  Last Pain:  Vitals:   09/03/22 0608  TempSrc:   PainSc: 0-No pain         Complications: No notable events documented.

## 2022-09-03 NOTE — Interval H&P Note (Signed)
History and Physical Interval Note:  09/03/2022 7:16 AM  Sheila [name]  has presented today for surgery, with the diagnosis of ER/PR + BREAST CANCER.  The various methods of treatment have been discussed with the patient and family. After consideration of risks, benefits and other options for treatment, the patient has consented to  Procedure(s): XI ROBOTIC ASSISTED BILATERAL SALPINGO OOPHORECTOMY (Bilateral) POSSIBLE ENDOMETRIAL BIOPSY POSSIBLE DILATATION AND CURETTAGE OF UTERUS (N/A) as a surgical intervention.  The patient's history has been reviewed, patient examined, no change in status, stable for surgery.  I have reviewed the patient's chart and labs.  Questions were answered to the patient's satisfaction.     Sheila [name]

## 2022-09-03 NOTE — Anesthesia Procedure Notes (Signed)
Procedure Name: Intubation Date/Time: 09/03/2022 7:30 AM  Performed by: Claudia Desanctis, CRNAPre-anesthesia Checklist: Patient identified, Emergency Drugs available, Suction available and Patient being monitored Patient Re-evaluated:Patient Re-evaluated prior to induction Oxygen Delivery Method: Circle system utilized Preoxygenation: Pre-oxygenation with 100% oxygen Induction Type: IV induction Ventilation: Mask ventilation without difficulty Laryngoscope Size: 2 and Miller Grade View: Grade I Tube type: Oral Tube size: 7.0 mm Number of attempts: 1 Airway Equipment and Method: Stylet Placement Confirmation: ETT inserted through vocal cords under direct vision, positive ETCO2 and breath sounds checked- equal and bilateral Tube secured with: Tape Dental Injury: Teeth and Oropharynx as per pre-operative assessment

## 2022-09-03 NOTE — Op Note (Addendum)
GYNECOLOGIC ONCOLOGY OPERATIVE NOTE  Date of Service: 09/03/2022  Preoperative Diagnosis: ER/PR + BREAST CANCER  Postoperative Diagnosis: Same  Procedures: [place]ell OOPHORECTOMY  Surgeon: Bernadene Bell, MD  Assistants: Lahoma Crocker, MD  Anesthesia: General  Estimated Blood Loss: 10 mL   Findings: Small mobile midplane uterus on bimanual exam. On entry to abdomen, normal upper abdominal survey with smooth diaphragm, liver, stomach and normal appearing omentum and bowel. Normal appearing uterus and bilateral fallopian tubes and ovaries.   Specimens:  ID Type Source Tests Collected by Time Destination  1 : Bilateral tubes and ovaries Tissue PATH Gyn benign resection SURGICAL PATHOLOGY Bernadene Bell, MD 30/07/2329 [id number]     Complications:  None  Indications for Procedure: Sheila [name] is a 46 y.o. woman with hormone positive breast cancer.  Prior to the procedure, all risks, benefits, and alternatives were discussed and informed surgical consent was signed.  Procedure: Patient was taken to the operating room where general anesthesia was achieved.  She was positioned in dorsal lithotomy and prepped and draped.  A foley catheter was inserted into the bladder.  The cervix was dilated and a uterine manipulator was inserted to the uterus.    A 10 mm incision was made in the left upper quadrant near Palmer's point.  The abdomen was entered with a 5 mm OptiView trocar under direct visualization.  The abdomen was insufflated, the patient placed in steep Trendelenburg, and additional trocars were placed as follows: an 58m robotic trocar superior to the umbilicus, onc 8 mm robotic trocars in the right abdomen, and one 8 mm robotic trocar in the left abdomen.  The left upper quadrant trocar was removed and replaced with a 12 mm airseal trocar.  All trocars were placed under direct visualization.  The bowels were moved into the upper abdomen.  The DaVinci  robotic surgical system was brought to the patient's bedside and docked.  The left retroperitoneum entered.  The left ureter was identified.  The left infundibulopelvic ligament was isolated, cauterized, and transected.  The mesosalpinx was taken to the uterine cornu.  The utero-ovarian ligament was isolated, cauterized, and transected.  The same procedure was performed on the contralateral side.  Both specimens were placed into an Endo-Catch bag and removed through the 12 mm trocar.  The pelvis was inspected and all operative sites were found to be hemostatic.  All instruments were removed and the robot was taken from the patient's bedside.  The abdomen was desufflated and all ports were removed.  The fascia at the 12 mm incision was closed with 0 Vicryl. The skin at all incisions was closed with 4-0 Vicryl to reapproximate the subcutaneous tissue and 4-0 monocryl in a subcuticular fashion followed by surgical glue.  Patient tolerated the procedure well. Sponge, lap, and instrument counts were correct.  No perioperative antibiotic prophylaxis was indicated for this procedure.  She was extubated and taken to the PACU in stable condition.  MBernadene Bell MD Gynecologic Oncology

## 2022-09-03 NOTE — Anesthesia Postprocedure Evaluation (Signed)
Anesthesia Post Note  Patient: Sheila [name]  Procedure(s) Performed: XI ROBOTIC ASSISTED BILATERAL SALPINGO OOPHORECTOMY (Bilateral)     Patient location during evaluation: PACU Anesthesia Type: General Level of consciousness: awake and alert and oriented Pain management: pain level controlled Vital Signs Assessment: post-procedure vital signs reviewed and stable Respiratory status: spontaneous breathing, nonlabored ventilation and respiratory function stable Cardiovascular status: blood pressure returned to baseline and stable Postop Assessment: no apparent nausea or vomiting Anesthetic complications: no   No notable events documented.  Last Vitals:  Vitals:   09/03/22 0945 09/03/22 1000  BP: 123/70 119/81  Pulse: 95 (!) 109  Resp: 10 14  Temp:    SpO2: 98% 100%    Last Pain:  Vitals:   09/03/22 0945  TempSrc:   PainSc: Asleep                 Aulden Calise A.

## 2022-09-03 NOTE — Discharge Instructions (Addendum)
  Medications:  - Take ibuprofen and tylenol first line for pain control. Take these regularly (every 6 hours) to decrease the build up of pain.  - If necessary, for severe pain not relieved by ibuprofen, take tramadol.  - While taking tramadol you should take sennakot every night to reduce the likelihood of constipation. If this causes diarrhea, stop its use.  Diet: 1. Low sodium Heart Healthy Diet is recommended.  2. It is safe to use a laxative if you have difficulty moving your bowels.   Wound Care: 1. Keep clean and dry.  Shower daily.  Reasons to call the Doctor:  Fever - Oral temperature greater than 100.4 degrees Fahrenheit Foul-smelling vaginal discharge Difficulty urinating Nausea and vomiting Increased pain at the site of the incision that is unrelieved with pain medicine. Difficulty breathing with or without chest pain New calf pain especially if only on one side Sudden, continuing increased vaginal bleeding with or without clots.   Follow-up: 1. See Meredith Newton in 3 weeks. You will have a phone visit once pathology is back.  Contacts: For questions or concerns you should contact:  Dr. Meredith Newton at [phone] After hours and on week-ends call [phone] and ask to speak to the physician on call for Gynecologic Oncology 

## 2022-09-04 ENCOUNTER — Encounter (HOSPITAL_COMMUNITY): Payer: Self-pay | Admitting: Psychiatry

## 2022-09-04 LAB — SURGICAL PATHOLOGY

## 2022-09-19 ENCOUNTER — Encounter: Payer: Self-pay | Admitting: Psychiatry

## 2022-09-23 ENCOUNTER — Inpatient Hospital Stay: Payer: BC Managed Care – PPO | Attending: Nurse Practitioner | Admitting: Psychiatry

## 2022-09-23 ENCOUNTER — Encounter: Payer: Self-pay | Admitting: Psychiatry

## 2022-09-23 ENCOUNTER — Other Ambulatory Visit: Payer: Self-pay

## 2022-09-23 VITALS — BP 107/78 | HR 100 | Resp 14 | Ht 66.0 in | Wt 159.6 lb

## 2022-09-23 DIAGNOSIS — Z170 Estrogen receptor positive status [ER+]: Secondary | ICD-10-CM

## 2022-09-23 DIAGNOSIS — C50411 Malignant neoplasm of upper-outer quadrant of right female breast: Secondary | ICD-10-CM

## 2022-09-23 DIAGNOSIS — Z90722 Acquired absence of ovaries, bilateral: Secondary | ICD-10-CM

## 2022-09-23 NOTE — Progress Notes (Signed)
Gynecologic Oncology Return Clinic Visit  Date of Service: 09/23/2022 Referring Provider: [name], MD  Assessment & Plan: Sheila [name] is a 46 y.o. woman with hormone receptor positive breast cancer who is 3 weeks s/p robotic assisted bilateral salpingo-oophorectomy on 09/03/2022.  Postop: - Pt recovering well from surgery and healing appropriately postoperatively - Intraoperative findings and pathology results reviewed: Benign - Ongoing postoperative expectations and precautions reviewed. Continue with no lifting >10lbs through 6 weeks postoperatively - Given that uterus is in situ, pt advised that she should continue with pap smear screening per routine until age 6 if she continues with negative/low grade paps.  - Reviewed that she is surgically postmenopausal, she should not have any spotting or bleeding following recovery from this procedure.  If this were to occur, she should be evaluated for postmenopausal bleeding.  RTC as needed.  Bernadene Bell, MD Gynecologic Oncology   Medical Decision Making I personally spent  TOTAL 20 minutes face-to-face and non-face-to-face in the care of this patient, which includes all pre, intra, and post visit time on the date of service.  2 minutes spent reviewing records prior to the visit 15 Minutes in patient contact 3 minutes charting , conferring with consultants etc.   ----------------------- Reason for Visit: Postop  Treatment History: Oncology History Overview Note   Cancer Staging Malignant neoplasm of upper-outer quadrant of right breast in female, estrogen receptor positive ([place]) Staging form: Breast, AJCC 8th Edition - Clinical stage from 06/11/2017: Stage IB (cT3, cN0, cM0, G2, ER: Positive, PR: Positive, HER2: Positive) - Signed by [name], MD on 06/16/2017 - Pathologic stage from 07/22/2017: Stage IB (pT3(m), pN2a(sn), cM0, G2, ER: Positive, PR: Positive, HER2: Positive) - Signed by [name], NP on 08/04/2017     Malignant neoplasm of upper-outer quadrant of right breast in female, estrogen receptor positive ([place])  06/11/2017 Initial Biopsy   Diagnosis 06/11/17 Breast, right, needle core biopsy, 11:00 o'clock - INVASIVE DUCTAL CARCINOMA, G2   06/11/2017 Mammogram   [name] and [facility] Diagnostic Breast Tomo Bilateral 06/11/17 IMPRESSION: 1. Highly suspicious mass within the retroareolar right breast, extending from the 8:00 to 11:00 axes, anterior to posterior depth, extending anteriorly to the nipple with associated nipple retraction, measuring at least 5.5 cm by ultrasound   2. No sonographic evidence of metastatic lymphadenopathy in the right axilla.  3. No evidence of malignancy within the left breast.    06/11/2017 Initial Diagnosis   Malignant neoplasm of upper-outer quadrant of right breast in female, estrogen receptor positive ([place])   06/11/2017 Receptors her2   Estrogen Receptor: 100%, POSITIVE, STRONG STAINING INTENSITY Progesterone Receptor: 100%, POSITIVE, STRONG STAINING INTENSITY Proliferation Marker Ki67: 12%Proliferation Marker Ki67: 12%  HER2 - **POSITIVE** RATIO OF HER2/CEP17 SIGNALS 2.55 AVERAGE HER2 COPY NUMBER PER CELL 4.85   06/25/2017 Genetic Testing   Negative genetic testing on the 9 gene STAT panel.  The STAT Breast cancer panel offered by [facility] includes sequencing and rearrangement analysis for the following 9 genes:  ATM, BRCA1, BRCA2, CDH1, CHEK2, PALB2, PTEN, STK11 and TP53.   The report date is June 25, 2017.  Negative genetic testing on the common hereditary cancer panel.  The Hereditary Gene Panel offered by [facility] includes sequencing and/or deletion duplication testing of the following 46 genes: APC, ATM, AXIN2, BARD1, BMPR1A, BRCA1, BRCA2, BRIP1, CDH1, CDKN2A (p14ARF), CDKN2A (p16INK4a), CHEK2, CTNNA1, DICER1, EPCAM (Deletion/duplication testing only), GREM1 (promoter region deletion/duplication testing only), KIT, MEN1, MLH1, MSH2, MSH3, MSH6, MUTYH, NBN, NF1, NHTL1,  PALB2,  PDGFRA, PMS2, POLD1, POLE, PTEN, RAD50, RAD51C, RAD51D, SDHB, SDHC, SDHD, SMAD4, SMARCA4. STK11, TP53, TSC1, TSC2, and VHL.  The following genes were evaluated for sequence changes only: SDHA and HOXB13 c.251G>A variant only.  The report date is June 25, 2017.    07/02/2017 Imaging   CT cAP 07/02/17 IMPRESSION: 1. Subareolar right breast mass. No compelling findings of nodal or metastatic involvement. 2. Old granulomatous disease. 3. There is a 7 mm enhancing or hyperdense focus posteriorly in segment 7 of the liver in the subcapsular region. By virtue of its small size this lesion is technically nonspecific although statistically likely to be a small benign lesions such as flash filling hemangioma. This may warrant surveillance. 4.  Prominent stool throughout the colon favors constipation. 5. Degenerative disc disease and spondylosis at L5-S1 likely causing mild impingement.   07/02/2017 Imaging   Bone scan 07/02/17 IMPRESSION: Today' s exam is negative. I ascribed the tiny focus of activity just proximal to the left antecubital region to injection site.   07/22/2017 Surgery   RIGHT TOTAL MASTECTOMY WITH RIGHT AXILLARY SENTINEL LYMPH NODE BIOPSY, LEFT PROPHYLACTIC MASTECTOM and INSERTION PORT-A-CATH WITH [name] by [name] and [facility]    07/22/2017 Pathology Results   Diagnosis 1. Breast, simple mastectomy, Left - FIBROADENOMA. - NO MALIGNANCY IDENTIFIED. 2. 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 - ADDITIONAL FOCUS OF LOBULAR CARCINOMA, GRADE 2, SPANNING 2.2 CM. - INTERMEDIATE GRADE DUCTAL CARCINOMA IN SITU. - INVASIVE CARCINOMA COMES TO WITHIN 0.2 TO 0.3 CM OF THE DEEP MARGIN, FOCALLY. - TUMOR FOCALLY INVOLVES EPIDERMIS. - LYMPHOVASCULAR INVASION OF DERMAL LYMPHATICS. - ONE OF ONE LYMPH NODES NEGATIVE FOR CARCINOMA (0/1). - SEE ONCOLOGY TABLE. 3. Lymph node, sentinel, biopsy, Right axillary - ONE LYMPH  NODE WITH ISOLATED TUMOR CELLS (0/1). 4. Lymph node, sentinel, biopsy, Right - METASTATIC CARCINOMA IN ONE OF ONE LYMPH NODES (1/1). 5. Lymph node, sentinel, biopsy, Right - ONE OF ONE LYMPH NODES NEGATIVE FOR CARCINOMA (0/1). 6. Lymph node, sentinel, biopsy, Right - METASTATIC CARCINOMA IN ONE OF ONE LYMPH NODES (1/1). - EXTRACAPSULAR EXTENSION. 7. Lymph node, sentinel, biopsy, Right - METASTATIC CARCINOMA IN ONE OF ONE LYMPH NODES (1/1). - EXTRACAPSULAR EXTENSION. 8. Lymph node, sentinel, biopsy, Right - ONE OF ONE LYMPH NODES NEGATIVE FOR CARCINOMA (0/1). 9. Lymph node, sentinel, biopsy, Right - ONE OF ONE LYMPH NODES NEGATIVE FOR CARCINOMA (0/1). 10. Lymph node, sentinel, biopsy, Right - METASTATIC CARCINOMA IN ONE OF ONE LYMPH NODES (1/1). - EXTRACAPSULAR EXTENSION. 11. Lymph node, biopsy, Right axillary - ONE OF ONE LYMPH NODES NEGATIVE FOR CARCINOMA (0/1).    07/22/2017 Receptors her2   Ductal carcinoma in primary breast tumor and node metastasis are ER 95%, PR 95% strongly positive, and HER2 + The lobular component in primary breast tumor is ER 95% positive, PR 95% positive, strong staining, HER-2 negative.   Original biopsy [id number]: Ductal morphology, Her2 FISH positive. Current specimen: Main focus is 75% ductal morphology, 25% lobular morphology. Smaller focus is lobular morphology. #2A Lobular morphology, Her IHC negative. #2E Lobular morphology, Her2 FISH negative, Her2 IHC negative. #4 50% lobular and 50% ductal morphology, Her2 FISH positive, Her2 IHC negative. #6 Ductal morphology #7 Ductal morphology #10 50% lobular, 50% ductal morphology   08/25/2017 Imaging   MRI abdomen done at [facility]:  Enhancing structure measing 5 mm is compatible with a flash filling hemangioma. No specific findings to suggest metastatic cancer   08/25/2017 Imaging  MRI Abdomen at [facility] 08/25/17  IMPRESSION: 1. Lesion in segment 7 of the liver has signal and  enhancement characteristics compatible with a flash filling hemangioma. No specific findings identified to suggest metastatic disease.   08/29/2017 - 12/12/2017 Chemotherapy   adjuvant TCHP every 3 weeks for 6 cycles starting 08/29/17. Postponed and decreased Carbo to 6653m and added Onpro with cycle 2 on 09/19/17 due to neutropenic fever. Carbplatin further reduced to 5049mstarting with cycle 3 due to thrombocytopenia. Herceptin was held for Cycle 4 and 5 due to worsening global longitudinal strain found on ECHO from 11/07/17. Completed 6 cycles on 12/12/17      09/04/2017 - 09/09/2017 Hospital Admission   Admit date: 09/04/2017 Discharge date: 09/09/2017   DISCHARGE DIAGNOSES:  Principal Problem:   Sepsis ([facility] Problems:   Malignant neoplasm of upper-outer quadrant of right breast in female, estrogen receptor positive [place]  Port-A-Cath in place   Neutropenic fever ([place]  Hyponatremia   Malignant neoplasm of breast in female, estrogen receptor positive ([place]    12/28/2017 Imaging   CT CHEST IMPRESSION: 1. 2.3 x 2.7 x 1.2 cm irregular masslike opacity within the right middle lobe worrisome for malignancy/metastasis. 2. 3 new very small sclerotic lesions, within the sternum and the body of T4, worrisome for new metastases.   12/28/2017 - 01/05/2018 Hospital Admission   Admit date: 12/28/17-01/05/18 Admission diagnosis: multifocal pneumonia, influenza A, sepsis, hypoxia Additional comments: imaging during hospitalization notable for new irregular masslike opacity in the right middle lobe worrisome for metastasis and new very small sclerotic lesion within the sternum and the body of T4, worrisome for metastasis   12/29/2017 Imaging   CT ABD/PELVIS IMPRESSION: No definite acute intra-abdominal or intrapelvic abnormalities.   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   12/30/2017 Imaging   CT HEAD IMPRESSION: No acute intracranial  abnormality or enhancing lesion.   01/16/2018 - 01/22/2019 Chemotherapy   Maintence Herceptin and Perjeta every 3 weeks for 6-12 months starting 01/16/18. Held 03/2018-07/17/18 due to low EF. Completed on 01/22/19    02/04/2018 PET scan   IMPRESSION: 1. The previous consolidation in the right middle lobe currently has a more nodular and reticular appearance with some associated mild atelectasis along its anterior margin. This demonstrates low-grade metabolic activity with maximum SUV 3.2 (background blood pool activity is 2.7). Although conceivably related to low-grade residuum from the patient's malignancy, the presence of calcified right hilar lymph nodes and small calcifications along the nodularity raise suspicion for a low-grade active granulomatous process or atypical infectious process. Surveillance is recommended. 2. Scattered deposits of hypermetabolic but benign metabolically active brown fat.   02/23/2018 - 04/09/2018 Radiation Therapy   Radiation treatment dates:   02/23/2018 - 04/09/2018   Site/dose:   The patient initially received a dose of 50.4 Gy in 28 fractions to the right chest wall and supraclavicular region. This was delivered using a 3-D conformal, 4 field technique. The patient then received a boost to the mastectomy scar. This delivered an additional 10 Gy in 5 fractions using an en face electron field. The total dose was 60.4 Gy.   Narrative: The patient tolerated radiation treatment relatively well.   The patient had some expected skin irritation as she progressed during treatment. Moist desquamation was not present at the end of treatment.   03/20/2018 -  Anti-estrogen oral therapy   -Monthly Zoladex injection starting 03/20/18  -Letrozole 2.53m63mnce dialy starting 05/03/18  -Switched  to Anastrozole starting 07/2018 Due to her worsening back pain. -Changed to Exemestane in 04/2019 due to back pain exacerbation.    04/06/2018 Imaging   CT Chest wo contrast IMPRESSION: 1.  Stable linear nodular thickening in the RIGHT middle lobe is favored post infectious or inflammatory scarring. 2. Calcified RIGHT hilar lymph node is consistent post infectious process. 3. Stable small LEFT upper lobe nodule. 4. Stable sclerotic lesion in the manubrium. Sclerotic lesion at T3 is more prominent (4 mm). Sclerotic lesion at L 1 is new from 07/02/2017.   04/06/2018 Imaging   04/06/2018 CT Chest IMPRESSION: 1. Stable linear nodular thickening in the RIGHT middle lobe is favored post infectious or inflammatory scarring. 2. Calcified RIGHT hilar lymph node is consistent post infectious process. 3. Stable small LEFT upper lobe nodule. 4. Stable sclerotic lesion in the manubrium. Sclerotic lesion at T3 is more prominent (4 mm). Sclerotic lesion at L 1 is new from 07/02/2017.   04/10/2018 Echocardiogram   LV EF 45%-50%   05/25/2018 Echocardiogram   LV EF 45%-50%   07/13/2018 Echocardiogram   Normal LV size with EF 55-60%   07/31/2018 Imaging   07/31/2018 Bone Scan IMPRESSION: No evidence of skeletal metastatic disease.   07/31/2018 Imaging   07/31/2018 CT CAP IMPRESSION: Residual linear and nodular peribronchovascular opacities in the right middle lobe, with other scattered areas of ground-glass opacities in the right middle, right upper and right lower lobes, which are located within the previously demonstrated bulky airspace consolidation. The findings are improved from the chest CT dated 12/30/2017, and relatively stable from PET-CT dated 02/04/2018. These may represent post infectious/post inflammatory changes or residual malignancy.   Stable partially calcified right hilar lymphadenopathy.   Interval development of nodular soft tissue thickening within the anterior mediastinum in the area of the thymus, new from chest CT dated 12/30/2017. This may represent reactive thymic tissue, thymoma or lymphadenopathy.   1.8 cm probably physiologic left ovarian cyst.  Borderline thickening of the endometrium measuring 15.6 mm. Further evaluation with pelvic ultrasound may be considered.   No new or suspicious findings within the abdomen.   09/15/2018 Echocardiogram   EF 55-60%   01/28/2019 Imaging   MRI Lumbar spie 01/28/19  IMPRESSION: 1. Subcentimeter lesions in the L1 and L2 vertebral bodies, indeterminate for metastases. 2. Mild lower lumbar disc degeneration without stenosis.   01/28/2019 Imaging   CT chest 01/28/19  IMPRESSION: Right lung post radiation changes. No active lung disease or other acute findings.   No evidence of recurrent or metastatic carcinoma within the thorax.   03/15/2019 - 02/2020 Chemotherapy   Anti-HER2 Neratinib 240 mg daily, starting 03/15/19. She completed 1 year of treatment in 02/2020     Interval History: Pt reports that she is recovering well from surgery. She is using only using Tylenol for pain.  Prior a few doses of oxycodone but not needing this anymore.  She is eating and drinking well. She is voiding without issue and having regular bowel movements.    Past Medical/Surgical History: Past Medical History:  Diagnosis Date   Anxiety    Cancer ([place])    right breast    Cardiomyopathy ([place])    Depression    Family history of breast cancer    Family history of colon cancer    GERD (gastroesophageal reflux disease)    Headache    couple of migraines in the past   History of kidney stones    Pneumonia    Vitamin D deficiency  Past Surgical History:  Procedure Laterality Date   egg donation     kidney stone removal     MASS EXCISION Right 08/19/2019   Procedure: REMOVE EXCESS TISSUE FROM RIGHT LATERAL MASTECTOMY SCAR;  Surgeon: [name], MD;  Location: [place];  Service: General;  Laterality: Right;   MASTECTOMY W/ SENTINEL NODE BIOPSY Bilateral 07/22/2017   Procedure: RIGHT TOTAL MASTECTOMY WITH RIGHT AXILLARY SENTINEL LYMPH NODE BIOPSY, LEFT PROPHYLACTIC MASTECTOMY;   Surgeon: [name], MD;  Location: [place];  Service: General;  Laterality: Bilateral;   PORT-A-CATH REMOVAL Right 08/19/2019   Procedure: REMOVAL PORT-A-CATH;  Surgeon: [name], MD;  Location: [place];  Service: General;  Laterality: Right;   PORTACATH PLACEMENT Right 07/22/2017   Procedure: INSERTION PORT-A-CATH WITH [name];  Surgeon: [name], MD;  Location: [place];  Service: General;  Laterality: Right;   ROBOTIC ASSISTED BILATERAL SALPINGO OOPHERECTOMY Bilateral 09/03/2022   Procedure: XI ROBOTIC ASSISTED BILATERAL SALPINGO OOPHORECTOMY;  Surgeon: Bernadene Bell, MD;  Location: [facility];  Service: Gynecology;  Laterality: Bilateral;    Family History  Problem Relation Age of Onset   Breast cancer Mother 37   Osteoporosis Mother    Stroke Maternal Grandfather    Breast cancer Paternal Grandmother 47   Colon cancer Paternal Grandmother 39   Prostate cancer Paternal Grandfather        dx in his 67s   Leukemia Paternal Grandfather    Lung cancer Maternal Uncle        heavy smoker   Other Paternal Uncle        farm accident   Ovarian cancer Neg Hx     Social History   Socioeconomic History   Marital status: Single    Spouse name: Not on file   Number of children: Not on file   Years of education: Not on file   Highest education level: Not on file  Occupational History   Not on file  Tobacco Use   Smoking status: Never   Smokeless tobacco: Former  Scientific laboratory technician Use: Never used  Substance and Sexual Activity   Alcohol use: Not Currently    Comment: once a year   Drug use: No   Sexual activity: Yes  Other Topics Concern   Not on file  Social History Narrative   Not on file   Social Determinants of Health   Financial Resource Strain: Not on file  Food Insecurity: Not on file  Transportation Needs: Not on file  Physical Activity: Not on file  Stress: Not on file  Social Connections: Not on file    Current  Medications:  Current Outpatient Medications:    acetaminophen (TYLENOL) 500 MG tablet, Take 1,000 mg by mouth every 6 (six) hours as needed for moderate pain., Disp: , Rfl:    B Complex-C (B-COMPLEX WITH VITAMIN C) tablet, Take 1 tablet by mouth daily., Disp: , Rfl:    calcium-vitamin D (OSCAL WITH D) 250-125 MG-UNIT tablet, Take 1 tablet by mouth daily., Disp: , Rfl:    cetirizine (ZYRTEC) 10 MG tablet, Take 10 mg by mouth daily., Disp: , Rfl:    Cholecalciferol (VITAMIN D) 2000 units tablet, Take 2,000 Units by mouth daily., Disp: , Rfl:    Cholecalciferol (VITAMIN D3) 1.25 MG (50000 UT) CAPS, Take 50,000 Units by mouth every 7 (seven) days., Disp: , Rfl:    exemestane (AROMASIN) 25 MG tablet, TAKE 1 TABLET BY MOUTH EVERY DAY AFTER BREAKFAST, Disp:  90 tablet, Rfl: 3   fluticasone (FLONASE) 50 MCG/ACT nasal spray, Place 1 spray into both nostrils daily as needed for allergies., Disp: , Rfl:    oxyCODONE (OXY IR/ROXICODONE) 5 MG immediate release tablet, Take 1 tablet (5 mg total) by mouth every 4 (four) hours as needed for severe pain. For AFTER surgery only, do not take and drive, Disp: 15 tablet, Rfl: 0   senna-docusate (SENOKOT-S) 8.6-50 MG tablet, Take 2 tablets by mouth at bedtime. For AFTER surgery, do not take if having diarrhea, Disp: 30 tablet, Rfl: 0   venlafaxine XR (EFFEXOR-XR) 75 MG 24 hr capsule, TAKE 3 CAPSULES BY MOUTH EVERY DAY WITH BREAKFAST, Disp: 90 capsule, Rfl: 0  Review of Symptoms: Complete 10-system review is negative except as above in Interval History.  Physical Exam: BP 107/78 (BP Location: Left Arm, Patient Position: Sitting)   Pulse 100   Resp 14   Ht _0  (1.676 m)   Wt 159 lb 9.6 oz (72.4 kg)   SpO2 98%   BMI 25.76 kg/m  General: Alert, oriented, no acute distress. HEENT: Normocephalic, atraumatic. Neck symmetric without masses. Sclera anicteric.  Chest: Normal work of breathing.  Abdomen: Soft, nontender.  Normoactive bowel sounds.  No masses or  hepatosplenomegaly appreciated.  Well-healing laparoscopic incisions with small amounts of surgical glue still in place. Extremities: Grossly normal range of motion.  Warm, well perfused.  No edema bilaterally. Skin: No rashes or lesions noted.   Laboratory & Radiologic Studies: Surgical pathology (09/03/22): FINAL MICROSCOPIC DIAGNOSIS:   A. FALLOPIAN TUBES AND OVARIES, BILATERAL, RESECTION:  - Benign bilateral fallopian tubes  - Benign bilateral ovaries

## 2022-09-23 NOTE — Patient Instructions (Signed)
It was a pleasure to see you in clinic today. - Everything looks great; healing well.   Thank you very much for allowing me to provide care for you today.  I appreciate your confidence in choosing our Gynecologic Oncology team at [facility].  If you have any questions about your visit today please call our office or send [name] a MyChart message and we will get back to you as soon as possible.

## 2022-10-14 ENCOUNTER — Encounter: Payer: BC Managed Care – PPO | Admitting: Psychiatry

## 2022-11-07 ENCOUNTER — Other Ambulatory Visit: Payer: Self-pay | Admitting: Hematology

## 2022-11-07 MED ORDER — VENLAFAXINE HCL ER 75 MG PO CP24: ORAL_CAPSULE | ORAL | 0 refills | Status: DC

## 2022-11-08 ENCOUNTER — Other Ambulatory Visit: Payer: Self-pay | Admitting: Hematology

## 2023-01-08 ENCOUNTER — Other Ambulatory Visit: Payer: Self-pay | Admitting: Hematology

## 2023-02-05 ENCOUNTER — Other Ambulatory Visit: Payer: Self-pay | Admitting: Hematology

## 2023-02-06 ENCOUNTER — Other Ambulatory Visit: Payer: Self-pay

## 2023-02-06 DIAGNOSIS — Z170 Estrogen receptor positive status [ER+]: Secondary | ICD-10-CM

## 2023-02-07 ENCOUNTER — Inpatient Hospital Stay: Payer: BC Managed Care – PPO | Attending: Hematology | Admitting: Hematology

## 2023-02-07 ENCOUNTER — Inpatient Hospital Stay: Payer: BC Managed Care – PPO

## 2023-02-07 NOTE — Progress Notes (Deleted)
[place]   [phone] [phone]   Clinic Follow up Note   Patient Care Team: Kristen Loader, FNP as PCP - General (Family Medicine) [name] (Family Medicine) [name], MD as Consulting Physician (General Surgery) [name], MD as Consulting Physician (Hematology) Kyung Rudd, MD as Consulting Physician (Radiation Oncology)  Date of Service:  02/07/2023  CHIEF COMPLAINT: f/u of right breast cancer   CURRENT THERAPY:   -Monthly Zoladex injections starting 03/20/18 -antiestrogen therapy since 05/03/18, currently exemestane since 04/2019  ASSESSMENT: *** Sheila [name] is a 47 y.o. female with   No problem-specific Assessment & Plan notes found for this encounter.  ***   PLAN:    SUMMARY OF ONCOLOGIC HISTORY: Oncology History Overview Note   Cancer Staging Malignant neoplasm of upper-outer quadrant of right breast in female, estrogen receptor positive ([place]) Staging form: Breast, AJCC 8th Edition - Clinical stage from 06/11/2017: Stage IB (cT3, cN0, cM0, G2, ER: Positive, PR: Positive, HER2: Positive) - Signed by [name], MD on 06/16/2017 - Pathologic stage from 07/22/2017: Stage IB (pT3(m), pN2a(sn), cM0, G2, ER: Positive, PR: Positive, HER2: Positive) - Signed by [name], NP on 08/04/2017    Malignant neoplasm of upper-outer quadrant of right breast in female, estrogen receptor positive ([place])  06/11/2017 Initial Biopsy   Diagnosis 06/11/17 Breast, right, needle core biopsy, 11:00 o'clock - INVASIVE DUCTAL CARCINOMA, G2   06/11/2017 Mammogram   [name] and MM Diagnostic Breast Tomo Bilateral 06/11/17 IMPRESSION: 1. Highly suspicious mass within the retroareolar right breast, extending from the 8:00 to 11:00 axes, anterior to posterior depth, extending anteriorly to the nipple with associated nipple retraction, measuring at least 5.5 cm by ultrasound   2. No sonographic evidence of metastatic lymphadenopathy in  the right axilla.  3. No evidence of malignancy within the left breast.    06/11/2017 Initial Diagnosis   Malignant neoplasm of upper-outer quadrant of right breast in female, estrogen receptor positive ([place])   06/11/2017 Receptors her2   Estrogen Receptor: 100%, POSITIVE, STRONG STAINING INTENSITY Progesterone Receptor: 100%, POSITIVE, STRONG STAINING INTENSITY Proliferation Marker Ki67: 12%Proliferation Marker Ki67: 12%  HER2 - **POSITIVE** RATIO OF HER2/CEP17 SIGNALS 2.55 AVERAGE HER2 COPY NUMBER PER CELL 4.85   06/25/2017 Genetic Testing   Negative genetic testing on the 9 gene STAT panel.  The STAT Breast cancer panel offered by [facility] includes sequencing and rearrangement analysis for the following 9 genes:  ATM, BRCA1, BRCA2, CDH1, CHEK2, PALB2, PTEN, STK11 and TP53.   The report date is June 25, 2017.  Negative genetic testing on the common hereditary cancer panel.  The Hereditary Gene Panel offered by [facility] includes sequencing and/or deletion duplication testing of the following 46 genes: APC, ATM, AXIN2, BARD1, BMPR1A, BRCA1, BRCA2, BRIP1, CDH1, CDKN2A (p14ARF), CDKN2A (p16INK4a), CHEK2, CTNNA1, DICER1, EPCAM (Deletion/duplication testing only), GREM1 (promoter region deletion/duplication testing only), KIT, MEN1, MLH1, MSH2, MSH3, MSH6, MUTYH, NBN, NF1, NHTL1, PALB2, PDGFRA, PMS2, POLD1, POLE, PTEN, RAD50, RAD51C, RAD51D, SDHB, SDHC, SDHD, SMAD4, SMARCA4. STK11, TP53, TSC1, TSC2, and VHL.  The following genes were evaluated for sequence changes only: SDHA and HOXB13 c.251G>A variant only.  The report date is June 25, 2017.    07/02/2017 Imaging   CT cAP 07/02/17 IMPRESSION: 1. Subareolar right breast mass. No compelling findings of nodal or metastatic involvement. 2. Old granulomatous disease. 3. There is a 7 mm enhancing or hyperdense focus posteriorly in segment 7 of the liver in the subcapsular region. By virtue  of its small size this lesion is technically nonspecific  although statistically likely to be a small benign lesions such as flash filling hemangioma. This may warrant surveillance. 4.  Prominent stool throughout the colon favors constipation. 5. Degenerative disc disease and spondylosis at L5-S1 likely causing mild impingement.   07/02/2017 Imaging   Bone scan 07/02/17 IMPRESSION: Today' s exam is negative. I ascribed the tiny focus of activity just proximal to the left antecubital region to injection site.   07/22/2017 Surgery   RIGHT TOTAL MASTECTOMY WITH RIGHT AXILLARY SENTINEL LYMPH NODE BIOPSY, LEFT PROPHYLACTIC MASTECTOM and INSERTION PORT-A-CATH WITH [name] by [name] and [facility]    07/22/2017 Pathology Results   Diagnosis 1. Breast, simple mastectomy, Left - FIBROADENOMA. - NO MALIGNANCY IDENTIFIED. 2. 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 - ADDITIONAL FOCUS OF LOBULAR CARCINOMA, GRADE 2, SPANNING 2.2 CM. - INTERMEDIATE GRADE DUCTAL CARCINOMA IN SITU. - INVASIVE CARCINOMA COMES TO WITHIN 0.2 TO 0.3 CM OF THE DEEP MARGIN, FOCALLY. - TUMOR FOCALLY INVOLVES EPIDERMIS. - LYMPHOVASCULAR INVASION OF DERMAL LYMPHATICS. - ONE OF ONE LYMPH NODES NEGATIVE FOR CARCINOMA (0/1). - SEE ONCOLOGY TABLE. 3. Lymph node, sentinel, biopsy, Right axillary - ONE LYMPH NODE WITH ISOLATED TUMOR CELLS (0/1). 4. Lymph node, sentinel, biopsy, Right - METASTATIC CARCINOMA IN ONE OF ONE LYMPH NODES (1/1). 5. Lymph node, sentinel, biopsy, Right - ONE OF ONE LYMPH NODES NEGATIVE FOR CARCINOMA (0/1). 6. Lymph node, sentinel, biopsy, Right - METASTATIC CARCINOMA IN ONE OF ONE LYMPH NODES (1/1). - EXTRACAPSULAR EXTENSION. 7. Lymph node, sentinel, biopsy, Right - METASTATIC CARCINOMA IN ONE OF ONE LYMPH NODES (1/1). - EXTRACAPSULAR EXTENSION. 8. Lymph node, sentinel, biopsy, Right - ONE OF ONE LYMPH NODES NEGATIVE FOR CARCINOMA (0/1). 9. Lymph node, sentinel, biopsy, Right - ONE OF ONE  LYMPH NODES NEGATIVE FOR CARCINOMA (0/1). 10. Lymph node, sentinel, biopsy, Right - METASTATIC CARCINOMA IN ONE OF ONE LYMPH NODES (1/1). - EXTRACAPSULAR EXTENSION. 11. Lymph node, biopsy, Right axillary - ONE OF ONE LYMPH NODES NEGATIVE FOR CARCINOMA (0/1).    07/22/2017 Receptors her2   Ductal carcinoma in primary breast tumor and node metastasis are ER 95%, PR 95% strongly positive, and HER2 + The lobular component in primary breast tumor is ER 95% positive, PR 95% positive, strong staining, HER-2 negative.   Original biopsy [id number]: Ductal morphology, Her2 FISH positive. Current specimen: Main focus is 75% ductal morphology, 25% lobular morphology. Smaller focus is lobular morphology. #2A Lobular morphology, Her IHC negative. #2E Lobular morphology, Her2 FISH negative, Her2 IHC negative. #4 50% lobular and 50% ductal morphology, Her2 FISH positive, Her2 IHC negative. #6 Ductal morphology #7 Ductal morphology #10 50% lobular, 50% ductal morphology   08/25/2017 Imaging   MRI abdomen done at [facility]:  Enhancing structure measing 5 mm is compatible with a flash filling hemangioma. No specific findings to suggest metastatic cancer   08/25/2017 Imaging   [facility] Abdomen [facility] 08/25/17  IMPRESSION: 1. Lesion in segment 7 of the liver has signal and enhancement characteristics compatible with a flash filling hemangioma. No specific findings identified to suggest metastatic disease.   08/29/2017 - 12/12/2017 Chemotherapy   adjuvant TCHP every 3 weeks for 6 cycles starting 08/29/17. Postponed and decreased Carbo to 650mg  and added Onpro with cycle 2 on 09/19/17 due to neutropenic fever. Carbplatin further reduced to 500mg  starting with cycle 3 due to thrombocytopenia. Herceptin was held for Cycle 4 and 5 due to worsening  global longitudinal strain found on ECHO from 11/07/17. Completed 6 cycles on 12/12/17      09/04/2017 - 09/09/2017 Hospital Admission   Admit date:  09/04/2017 Discharge date: 09/09/2017   DISCHARGE DIAGNOSES:  Principal Problem:   Sepsis [facility]) Active Problems:   Malignant neoplasm of upper-outer quadrant of right breast in female, estrogen receptor positive ([place])   Port-A-Cath in place   Neutropenic fever ([place])   Hyponatremia   Malignant neoplasm of breast in female, estrogen receptor positive ([place])     12/28/2017 Imaging   CT CHEST IMPRESSION: 1. 2.3 x 2.7 x 1.2 cm irregular masslike opacity within the right middle lobe worrisome for malignancy/metastasis. 2. 3 new very small sclerotic lesions, within the sternum and the body of T4, worrisome for new metastases.   12/28/2017 - 01/05/2018 Hospital Admission   Admit date: 12/28/17-01/05/18 Admission diagnosis: multifocal pneumonia, influenza A, sepsis, hypoxia Additional comments: imaging during hospitalization notable for new irregular masslike opacity in the right middle lobe worrisome for metastasis and new very small sclerotic lesion within the sternum and the body of T4, worrisome for metastasis   12/29/2017 Imaging   CT ABD/PELVIS IMPRESSION: No definite acute intra-abdominal or intrapelvic abnormalities.   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   12/30/2017 Imaging   CT HEAD IMPRESSION: No acute intracranial abnormality or enhancing lesion.   01/16/2018 - 01/22/2019 Chemotherapy   Maintence Herceptin and Perjeta every 3 weeks for 6-12 months starting 01/16/18. Held 03/2018-07/17/18 due to low EF. Completed on 01/22/19    02/04/2018 PET scan   IMPRESSION: 1. The previous consolidation in the right middle lobe currently has a more nodular and reticular appearance with some associated mild atelectasis along its anterior margin. This demonstrates low-grade metabolic activity with maximum SUV 3.2 (background blood pool activity is 2.7). Although conceivably related to low-grade residuum from the patient's malignancy, the presence of calcified  right hilar lymph nodes and small calcifications along the nodularity raise suspicion for a low-grade active granulomatous process or atypical infectious process. Surveillance is recommended. 2. Scattered deposits of hypermetabolic but benign metabolically active brown fat.   02/23/2018 - 04/09/2018 Radiation Therapy   Radiation treatment dates:   02/23/2018 - 04/09/2018   Site/dose:   The patient initially received a dose of 50.4 Gy in 28 fractions to the right chest wall and supraclavicular region. This was delivered using a 3-D conformal, 4 field technique. The patient then received a boost to the mastectomy scar. This delivered an additional 10 Gy in 5 fractions using an en face electron field. The total dose was 60.4 Gy.   Narrative: The patient tolerated radiation treatment relatively well.   The patient had some expected skin irritation as she progressed during treatment. Moist desquamation was not present at the end of treatment.   03/20/2018 -  Anti-estrogen oral therapy   -Monthly Zoladex injection starting 03/20/18  -Letrozole 2.5mg  once dialy starting 05/03/18  -Switched to Anastrozole starting 07/2018 Due to her worsening back pain. -Changed to Exemestane in 04/2019 due to back pain exacerbation.    04/06/2018 Imaging   CT Chest wo contrast IMPRESSION: 1. Stable linear nodular thickening in the RIGHT middle lobe is favored post infectious or inflammatory scarring. 2. Calcified RIGHT hilar lymph node is consistent post infectious process. 3. Stable small LEFT upper lobe nodule. 4. Stable sclerotic lesion in the manubrium. Sclerotic lesion at T3 is more prominent (4 mm). Sclerotic lesion at L 1 is new from 07/02/2017.  04/06/2018 Imaging   04/06/2018 CT Chest IMPRESSION: 1. Stable linear nodular thickening in the RIGHT middle lobe is favored post infectious or inflammatory scarring. 2. Calcified RIGHT hilar lymph node is consistent post infectious process. 3. Stable small LEFT  upper lobe nodule. 4. Stable sclerotic lesion in the manubrium. Sclerotic lesion at T3 is more prominent (4 mm). Sclerotic lesion at L 1 is new from 07/02/2017.   04/10/2018 Echocardiogram   LV EF 45%-50%   05/25/2018 Echocardiogram   LV EF 45%-50%   07/13/2018 Echocardiogram   Normal LV size with EF 55-60%   07/31/2018 Imaging   07/31/2018 Bone Scan IMPRESSION: No evidence of skeletal metastatic disease.   07/31/2018 Imaging   07/31/2018 CT CAP IMPRESSION: Residual linear and nodular peribronchovascular opacities in the right middle lobe, with other scattered areas of ground-glass opacities in the right middle, right upper and right lower lobes, which are located within the previously demonstrated bulky airspace consolidation. The findings are improved from the chest CT dated 12/30/2017, and relatively stable from PET-CT dated 02/04/2018. These may represent post infectious/post inflammatory changes or residual malignancy.   Stable partially calcified right hilar lymphadenopathy.   Interval development of nodular soft tissue thickening within the anterior mediastinum in the area of the thymus, new from chest CT dated 12/30/2017. This may represent reactive thymic tissue, thymoma or lymphadenopathy.   1.8 cm probably physiologic left ovarian cyst. Borderline thickening of the endometrium measuring 15.6 mm. Further evaluation with pelvic ultrasound may be considered.   No new or suspicious findings within the abdomen.   09/15/2018 Echocardiogram   EF 55-60%   01/28/2019 Imaging   MRI Lumbar spie 01/28/19  IMPRESSION: 1. Subcentimeter lesions in the L1 and L2 vertebral bodies, indeterminate for metastases. 2. Mild lower lumbar disc degeneration without stenosis.   01/28/2019 Imaging   CT chest 01/28/19  IMPRESSION: Right lung post radiation changes. No active lung disease or other acute findings.   No evidence of recurrent or metastatic carcinoma within the thorax.    03/15/2019 - 02/2020 Chemotherapy   Anti-HER2 Neratinib 240 mg daily, starting 03/15/19. She completed 1 year of treatment in 02/2020      INTERVAL HISTORY: *** Sheila [name] is here for a follow up of right breast cancer  She was last seen by me on 9/22/203. She presents to the clinic   All other systems were reviewed with the patient and are negative.  MEDICAL HISTORY:  Past Medical History:  Diagnosis Date   Anxiety    Cancer ([place])    right breast    Cardiomyopathy ([place])    Depression    Family history of breast cancer    Family history of colon cancer    GERD (gastroesophageal reflux disease)    Headache    couple of migraines in the past   History of kidney stones    Pneumonia    Vitamin D deficiency     SURGICAL HISTORY: Past Surgical History:  Procedure Laterality Date   egg donation     kidney stone removal     MASS EXCISION Right 08/19/2019   Procedure: REMOVE EXCESS TISSUE FROM RIGHT LATERAL MASTECTOMY SCAR;  Surgeon: [name], MD;  Location: [facility];  Service: General;  Laterality: Right;   MASTECTOMY W/ SENTINEL NODE BIOPSY Bilateral 07/22/2017   Procedure: RIGHT TOTAL MASTECTOMY WITH RIGHT AXILLARY SENTINEL LYMPH NODE BIOPSY, LEFT PROPHYLACTIC MASTECTOMY;  Surgeon: [name], MD;  Location: [place];  Service: General;  Laterality: Bilateral;  PORT-A-CATH REMOVAL Right 08/19/2019   Procedure: REMOVAL PORT-A-CATH;  Surgeon: [name], MD;  Location: [place] [facility];  Service: General;  Laterality: Right;   PORTACATH PLACEMENT Right 07/22/2017   Procedure: INSERTION PORT-A-CATH WITH [name];  Surgeon: [name], MD;  Location: [place];  Service: General;  Laterality: Right;   ROBOTIC ASSISTED BILATERAL SALPINGO OOPHERECTOMY Bilateral 09/03/2022   Procedure: XI ROBOTIC ASSISTED BILATERAL SALPINGO OOPHORECTOMY;  Surgeon: Bernadene Bell, MD;  Location: [facility];  Service: Gynecology;  Laterality: Bilateral;    I  have reviewed the social history and family history with the patient and they are unchanged from previous note.  ALLERGIES:  is allergic to eszopiclone.  MEDICATIONS:  Current Outpatient Medications  Medication Sig Dispense Refill   acetaminophen (TYLENOL) 500 MG tablet Take 1,000 mg by mouth every 6 (six) hours as needed for moderate pain.     B Complex-C (B-COMPLEX WITH VITAMIN C) tablet Take 1 tablet by mouth daily.     calcium-vitamin D (OSCAL WITH D) 250-125 MG-UNIT tablet Take 1 tablet by mouth daily.     cetirizine (ZYRTEC) 10 MG tablet Take 10 mg by mouth daily.     Cholecalciferol (VITAMIN D) 2000 units tablet Take 2,000 Units by mouth daily.     Cholecalciferol (VITAMIN D3) 1.25 MG (50000 UT) CAPS Take 50,000 Units by mouth every 7 (seven) days.     exemestane (AROMASIN) 25 MG tablet TAKE 1 TABLET BY MOUTH EVERY DAY AFTER BREAKFAST 90 tablet 3   fluticasone (FLONASE) 50 MCG/ACT nasal spray Place 1 spray into both nostrils daily as needed for allergies.     oxyCODONE (OXY IR/ROXICODONE) 5 MG immediate release tablet Take 1 tablet (5 mg total) by mouth every 4 (four) hours as needed for severe pain. For AFTER surgery only, do not take and drive 15 tablet 0   senna-docusate (SENOKOT-S) 8.6-50 MG tablet Take 2 tablets by mouth at bedtime. For AFTER surgery, do not take if having diarrhea 30 tablet 0   venlafaxine XR (EFFEXOR-XR) 75 MG 24 hr capsule TAKE 3 CAPSULES(225 MG) BY MOUTH DAILY 90 capsule 0   No current facility-administered medications for this visit.    PHYSICAL EXAMINATION: ECOG PERFORMANCE STATUS: {CHL ONC ECOG PS:[id number]}  There were no vitals filed for this visit. Wt Readings from Last 3 Encounters:  09/23/22 159 lb 9.6 oz (72.4 kg)  09/03/22 162 lb 0.6 oz (73.5 kg)  08/27/22 162 lb (73.5 kg)    {Only keep what was examined. If exam not performed, can use .CEXAM } GENERAL:alert, no distress and comfortable SKIN: skin color, texture, turgor are normal, no  rashes or significant lesions EYES: normal, Conjunctiva are pink and non-injected, sclera clear {OROPHARYNX:no exudate, no erythema and lips, buccal mucosa, and tongue normal}  NECK: supple, thyroid normal size, non-tender, without nodularity LYMPH:  no palpable lymphadenopathy in the cervical, axillary {or inguinal} LUNGS: clear to auscultation and percussion with normal breathing effort HEART: regular rate & rhythm and no murmurs and no lower extremity edema ABDOMEN:abdomen soft, non-tender and normal bowel sounds Musculoskeletal:no cyanosis of digits and no clubbing  NEURO: alert & oriented x 3 with fluent speech, no focal motor/sensory deficits  LABORATORY DATA:  I have reviewed the data as listed    Latest Ref Rng & Units 08/27/2022    8:16 AM 08/09/2022    2:54 PM 02/22/2022    2:50 PM  CBC  WBC 4.0 - 10.5 K/uL 4.0  5.9  5.4   Hemoglobin 12.0 -  15.0 g/dL 12.0  12.7  12.6   Hematocrit 36.0 - 46.0 % 37.0  38.1  38.2   Platelets 150 - 400 K/uL 214  241  224         Latest Ref Rng & Units 08/27/2022    8:16 AM 08/09/2022    2:54 PM 02/22/2022    2:50 PM  CMP  Glucose 70 - 99 mg/dL 129  109  135   BUN 6 - 20 mg/dL 14  9  11    Creatinine 0.44 - 1.00 mg/dL 0.84  1.10  1.09   Sodium 135 - 145 mmol/L 139  141  140   Potassium 3.5 - 5.1 mmol/L 3.7  4.3  4.3   Chloride 98 - 111 mmol/L 108  106  105   CO2 22 - 32 mmol/L 24  30  26    Calcium 8.9 - 10.3 mg/dL 9.2  9.6  9.9   Total Protein 6.5 - 8.1 g/dL 7.1  7.5  7.9   Total Bilirubin 0.3 - 1.2 mg/dL 0.6  0.4  0.6   Alkaline Phos 38 - 126 U/L 88  114  117   AST 15 - 41 U/L 18  14  14    ALT 0 - 44 U/L 13  13  10        RADIOGRAPHIC STUDIES: I have personally reviewed the radiological images as listed and agreed with the findings in the report. No results found.    No orders of the defined types were placed in this encounter.  All questions were answered. The patient knows to call the clinic with any problems, questions or  concerns. No barriers to learning was detected. The total time spent in the appointment was {CHL ONC TIME VISIT - [id number].     Baldemar Friday, CMA 02/07/2023   I, Audry Riles, CMA, am acting as scribe for [name], MD.   {Add scribe attestation statement}

## 2023-02-07 NOTE — Assessment & Plan Note (Deleted)
mixed invasive ductal and lobular carcinoma, pT3(m)N2aM0, stage 1b, ductal carcinoma triple positive, lobular carcinoma ER+/PR+/HER2-,  Grade 2 -diagnosed in 05/2017. S/p b/l mastectomy, adjuvant TCHP, radiation, maintenance Herceptin/Perjeta, and 1 year of Neratinib.  -She started adjuvant letrozole with monthly Zoladex injections in 04/2018.  Letrozole was subsequently changed to anastrozole, then exemestane due to some joint and back pain. She tolerates exemestane well with mild hot flashes, tolerable.  -she underwent BSO with Dr. Ernestina Patches in gyn onc on 09/03/2022 -She is clinically doing very well.  Lab reviewed, all WNL. Physical exam was unremarkable, there is no clinical concern for recurrence. -Continue exemestane -f/u in 6 months

## 2023-03-04 ENCOUNTER — Other Ambulatory Visit: Payer: Self-pay | Admitting: Hematology

## 2023-03-04 ENCOUNTER — Telehealth: Payer: Self-pay | Admitting: Hematology

## 2023-03-04 NOTE — Telephone Encounter (Signed)
Contacted patient to scheduled appointments. Patient is aware of appointments that are scheduled.   

## 2023-04-04 ENCOUNTER — Inpatient Hospital Stay: Payer: BC Managed Care – PPO

## 2023-04-04 ENCOUNTER — Other Ambulatory Visit: Payer: Self-pay

## 2023-04-04 ENCOUNTER — Encounter: Payer: Self-pay | Admitting: Hematology

## 2023-04-04 ENCOUNTER — Inpatient Hospital Stay: Payer: BC Managed Care – PPO | Attending: Hematology | Admitting: Hematology

## 2023-04-04 VITALS — BP 118/73 | HR 113 | Temp 99.0°F | Resp 18 | Ht 66.0 in | Wt 165.4 lb

## 2023-04-04 DIAGNOSIS — Z800 Family history of malignant neoplasm of digestive organs: Secondary | ICD-10-CM | POA: Diagnosis not present

## 2023-04-04 DIAGNOSIS — Z79811 Long term (current) use of aromatase inhibitors: Secondary | ICD-10-CM | POA: Insufficient documentation

## 2023-04-04 DIAGNOSIS — C50411 Malignant neoplasm of upper-outer quadrant of right female breast: Secondary | ICD-10-CM | POA: Diagnosis not present

## 2023-04-04 DIAGNOSIS — E2839 Other primary ovarian failure: Secondary | ICD-10-CM | POA: Diagnosis not present

## 2023-04-04 DIAGNOSIS — Z170 Estrogen receptor positive status [ER+]: Secondary | ICD-10-CM | POA: Insufficient documentation

## 2023-04-04 DIAGNOSIS — Z9013 Acquired absence of bilateral breasts and nipples: Secondary | ICD-10-CM | POA: Insufficient documentation

## 2023-04-04 DIAGNOSIS — Z803 Family history of malignant neoplasm of breast: Secondary | ICD-10-CM | POA: Insufficient documentation

## 2023-04-04 LAB — CBC WITH DIFFERENTIAL (CANCER CENTER ONLY)
Abs Immature Granulocytes: 0.01 10*3/uL (ref 0.00–0.07)
Basophils Absolute: 0.1 10*3/uL (ref 0.0–0.1)
Basophils Relative: 1 %
Eosinophils Absolute: 0.2 10*3/uL (ref 0.0–0.5)
Eosinophils Relative: 4 %
HCT: 35.4 % — ABNORMAL LOW (ref 36.0–46.0)
Hemoglobin: 11.7 g/dL — ABNORMAL LOW (ref 12.0–15.0)
Immature Granulocytes: 0 %
Lymphocytes Relative: 32 %
Lymphs Abs: 1.8 10*3/uL (ref 0.7–4.0)
MCH: 28.2 pg (ref 26.0–34.0)
MCHC: 33.1 g/dL (ref 30.0–36.0)
MCV: 85.3 fL (ref 80.0–100.0)
Monocytes Absolute: 0.3 10*3/uL (ref 0.1–1.0)
Monocytes Relative: 4 %
Neutro Abs: 3.4 10*3/uL (ref 1.7–7.7)
Neutrophils Relative %: 59 %
Platelet Count: 244 10*3/uL (ref 150–400)
RBC: 4.15 MIL/uL (ref 3.87–5.11)
RDW: 13.6 % (ref 11.5–15.5)
WBC Count: 5.7 10*3/uL (ref 4.0–10.5)
nRBC: 0 % (ref 0.0–0.2)

## 2023-04-04 LAB — CMP (CANCER CENTER ONLY)
ALT: 9 U/L (ref 0–44)
AST: 12 U/L — ABNORMAL LOW (ref 15–41)
Albumin: 4.4 g/dL (ref 3.5–5.0)
Alkaline Phosphatase: 106 U/L (ref 38–126)
Anion gap: 10 (ref 5–15)
BUN: 13 mg/dL (ref 6–20)
CO2: 26 mmol/L (ref 22–32)
Calcium: 9.5 mg/dL (ref 8.9–10.3)
Chloride: 105 mmol/L (ref 98–111)
Creatinine: 0.99 mg/dL (ref 0.44–1.00)
GFR, Estimated: >60 mL/min (ref >60)
Glucose, Bld: 182 mg/dL — ABNORMAL HIGH (ref 70–99)
Potassium: 3.8 mmol/L (ref 3.5–5.1)
Sodium: 141 mmol/L (ref 135–145)
Total Bilirubin: 0.3 mg/dL (ref 0.3–1.2)
Total Protein: 7.4 g/dL (ref 6.5–8.1)

## 2023-04-04 MED ORDER — VENLAFAXINE HCL ER 75 MG PO CP24: 225.0000 mg | ORAL_CAPSULE | Freq: Every day | ORAL | 5 refills | Status: DC

## 2023-04-04 MED ORDER — EXEMESTANE 25 MG PO TABS: 25.0000 mg | ORAL_TABLET | Freq: Every day | ORAL | 3 refills | Status: DC

## 2023-04-04 NOTE — Progress Notes (Signed)
[facility]   Telephone:([phone] [phone]   Clinic Follow up Note   Patient Care Team: Soundra Pilon, FNP as PCP - General (Family Medicine) Delano Metz MD (Family Medicine) Emelia Loron, MD as Consulting Physician (General Surgery) Malachy Mood, MD as Consulting Physician (Hematology) Dorothy Puffer, MD as Consulting Physician (Radiation Oncology)  Date of Service:  04/04/2023  CHIEF COMPLAINT: f/u of  right breast cancer   CURRENT THERAPY:   -Monthly Zoladex injections starting 03/20/18 -antiestrogen therapy since 05/03/18, currently exemestane since 04/2019  ASSESSMENT:  [name] is a 47 y.o. 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 S/p b/l mastectomy, adjuvant TCHP, radiation, maintenance Herceptin/Perjeta, and 1 year of Neratinib.  -She started adjuvant letrozole with monthly Zoladex injections in 04/2018.  Letrozole was subsequently changed to anastrozole, then exemestane due to some joint and back pain. She tolerates exemestane well with mild hot flashes, tolerable.  -she had BSO in 08/2022  -She is clinically doing well, asymptomatic, tolerating exemestane very well, will continue for total of 10 years. -Continue breast cancer surveillance.   PLAN: -lab reviewed -Continue Exemestane for 10 years - I refill effexor and Exemestane -I order Bone density scan 10/2023 -lab and f/I in 9 months   SUMMARY OF ONCOLOGIC HISTORY: Oncology History Overview Note   Cancer Staging Malignant neoplasm of upper-outer quadrant of right breast in female, estrogen receptor positive (HCC) Staging form: Breast, AJCC 8th Edition - Clinical stage from 06/11/2017: Stage IB (cT3, cN0, cM0, G2, ER: Positive, PR:  Positive, HER2: Positive) - Signed by Malachy Mood, MD on 06/16/2017 - Pathologic stage from 07/22/2017: Stage IB (pT3(m), pN2a(sn), cM0, G2, ER: Positive, PR: Positive, HER2: Positive) - Signed by Pollyann Samples, NP on 08/04/2017    Malignant neoplasm of upper-outer quadrant of right breast in female, estrogen receptor positive (HCC)  06/11/2017 Initial Biopsy   Diagnosis 06/11/17 Breast, right, needle core biopsy, 11:00 o'clock - INVASIVE DUCTAL CARCINOMA, G2   06/11/2017 Mammogram   [name] and [facility] Diagnostic Breast Tomo Bilateral 06/11/17 IMPRESSION: 1. Highly suspicious mass within the retroareolar right breast, extending from the 8:00 to 11:00 axes, anterior to posterior depth, extending anteriorly to the nipple with associated nipple retraction, measuring at least 5.5 cm by ultrasound   2. No sonographic evidence of metastatic lymphadenopathy in the right axilla.  3. No evidence of malignancy within the left breast.    06/11/2017 Initial Diagnosis   Malignant neoplasm of upper-outer quadrant of right breast in female, estrogen receptor positive (HCC)   06/11/2017 Receptors her2   Estrogen Receptor: 100%, POSITIVE, STRONG STAINING INTENSITY Progesterone Receptor: 100%, POSITIVE, STRONG STAINING INTENSITY Proliferation Marker Ki67: 12%Proliferation Marker Ki67: 12%  HER2 - **POSITIVE** RATIO OF HER2/CEP17 SIGNALS 2.55 AVERAGE HER2 COPY NUMBER PER CELL 4.85   06/25/2017 Genetic Testing   Negative genetic testing on the 9 gene STAT panel.  The STAT Breast cancer panel offered by [facility] includes sequencing and rearrangement analysis for the following 9 genes:  ATM, BRCA1, BRCA2, CDH1, CHEK2, PALB2, PTEN, STK11 and TP53.   The report date is June 25, 2017.  Negative genetic testing on the common hereditary cancer panel.  The Hereditary Gene Panel offered by [facility] includes sequencing and/or deletion duplication testing of the following 46 genes: APC, ATM, AXIN2, BARD1, BMPR1A,  BRCA1, BRCA2, BRIP1,  CDH1, CDKN2A (p14ARF), CDKN2A (p16INK4a), CHEK2, CTNNA1, DICER1, EPCAM (Deletion/duplication testing only), GREM1 (promoter region deletion/duplication testing only), KIT, MEN1, MLH1, MSH2, MSH3, MSH6, MUTYH, NBN, NF1, NHTL1, PALB2, PDGFRA, PMS2, POLD1, POLE, PTEN, RAD50, RAD51C, RAD51D, SDHB, SDHC, SDHD, SMAD4, SMARCA4. STK11, TP53, TSC1, TSC2, and VHL.  The following genes were evaluated for sequence changes only: SDHA and HOXB13 c.251G>A variant only.  The report date is June 25, 2017.    07/02/2017 Imaging   CT cAP 07/02/17 IMPRESSION: 1. Subareolar right breast mass. No compelling findings of nodal or metastatic involvement. 2. Old granulomatous disease. 3. There is a 7 mm enhancing or hyperdense focus posteriorly in segment 7 of the liver in the subcapsular region. By virtue of its small size this lesion is technically nonspecific although statistically likely to be a small benign lesions such as flash filling hemangioma. This may warrant surveillance. 4.  Prominent stool throughout the colon favors constipation. 5. Degenerative disc disease and spondylosis at L5-S1 likely causing mild impingement.   07/02/2017 Imaging   Bone scan 07/02/17 IMPRESSION: Today' s exam is negative. I ascribed the tiny focus of activity just proximal to the left antecubital region to injection site.   07/22/2017 Surgery   RIGHT TOTAL MASTECTOMY WITH RIGHT AXILLARY SENTINEL LYMPH NODE BIOPSY, LEFT PROPHYLACTIC MASTECTOM and INSERTION PORT-A-CATH WITH [name] by Dr. Dwain Sarna and [facility]    07/22/2017 Pathology Results   Diagnosis 1. Breast, simple mastectomy, Left - FIBROADENOMA. - NO MALIGNANCY IDENTIFIED. 2. 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 - ADDITIONAL FOCUS OF LOBULAR CARCINOMA, GRADE 2, SPANNING 2.2 CM. - INTERMEDIATE GRADE DUCTAL CARCINOMA IN SITU. - INVASIVE CARCINOMA COMES TO WITHIN 0.2 TO 0.3 CM OF THE DEEP MARGIN,  FOCALLY. - TUMOR FOCALLY INVOLVES EPIDERMIS. - LYMPHOVASCULAR INVASION OF DERMAL LYMPHATICS. - ONE OF ONE LYMPH NODES NEGATIVE FOR CARCINOMA (0/1). - SEE ONCOLOGY TABLE. 3. Lymph node, sentinel, biopsy, Right axillary - ONE LYMPH NODE WITH ISOLATED TUMOR CELLS (0/1). 4. Lymph node, sentinel, biopsy, Right - METASTATIC CARCINOMA IN ONE OF ONE LYMPH NODES (1/1). 5. Lymph node, sentinel, biopsy, Right - ONE OF ONE LYMPH NODES NEGATIVE FOR CARCINOMA (0/1). 6. Lymph node, sentinel, biopsy, Right - METASTATIC CARCINOMA IN ONE OF ONE LYMPH NODES (1/1). - EXTRACAPSULAR EXTENSION. 7. Lymph node, sentinel, biopsy, Right - METASTATIC CARCINOMA IN ONE OF ONE LYMPH NODES (1/1). - EXTRACAPSULAR EXTENSION. 8. Lymph node, sentinel, biopsy, Right - ONE OF ONE LYMPH NODES NEGATIVE FOR CARCINOMA (0/1). 9. Lymph node, sentinel, biopsy, Right - ONE OF ONE LYMPH NODES NEGATIVE FOR CARCINOMA (0/1). 10. Lymph node, sentinel, biopsy, Right - METASTATIC CARCINOMA IN ONE OF ONE LYMPH NODES (1/1). - EXTRACAPSULAR EXTENSION. 11. Lymph node, biopsy, Right axillary - ONE OF ONE LYMPH NODES NEGATIVE FOR CARCINOMA (0/1).    07/22/2017 Receptors her2   Ductal carcinoma in primary breast tumor and node metastasis are ER 95%, PR 95% strongly positive, and HER2 + The lobular component in primary breast tumor is ER 95% positive, PR 95% positive, strong staining, HER-2 negative.   Original biopsy [id number]: Ductal morphology, Her2 FISH positive. Current specimen: Main focus is 75% ductal morphology, 25% lobular morphology. Smaller focus is lobular morphology. #2A Lobular morphology, Her IHC negative. #2E Lobular morphology, Her2 FISH negative, Her2 IHC negative. #4 50% lobular and 50% ductal morphology, Her2 FISH positive, Her2 IHC negative. #6 Ductal morphology #7 Ductal morphology #10 50% lobular, 50% ductal morphology   08/25/2017 Imaging  MRI abdomen done at [facility]:  Enhancing structure measing 5 mm is  compatible with a flash filling hemangioma. No specific findings to suggest metastatic cancer   08/25/2017 Imaging   MRI Abdomen at [facility] 08/25/17  IMPRESSION: 1. Lesion in segment 7 of the liver has signal and enhancement characteristics compatible with a flash filling hemangioma. No specific findings identified to suggest metastatic disease.   08/29/2017 - 12/12/2017 Chemotherapy   adjuvant TCHP every 3 weeks for 6 cycles starting 08/29/17. Postponed and decreased Carbo to 650mg  and added Onpro with cycle 2 on 09/19/17 due to neutropenic fever. Carbplatin further reduced to 500mg  starting with cycle 3 due to thrombocytopenia. Herceptin was held for Cycle 4 and 5 due to worsening global longitudinal strain found on ECHO from 11/07/17. Completed 6 cycles on 12/12/17      09/04/2017 - 09/09/2017 Hospital Admission   Admit date: 09/04/2017 Discharge date: 09/09/2017   DISCHARGE DIAGNOSES:  Principal Problem:   Sepsis [facility]) Active Problems:   Malignant neoplasm of upper-outer quadrant of right breast in female, estrogen receptor positive (HCC)   Port-A-Cath in place   Neutropenic fever (HCC)   Hyponatremia   Malignant neoplasm of breast in female, estrogen receptor positive (HCC)     12/28/2017 Imaging   CT CHEST IMPRESSION: 1. 2.3 x 2.7 x 1.2 cm irregular masslike opacity within the right middle lobe worrisome for malignancy/metastasis. 2. 3 new very small sclerotic lesions, within the sternum and the body of T4, worrisome for new metastases.   12/28/2017 - 01/05/2018 Hospital Admission   Admit date: 12/28/17-01/05/18 Admission diagnosis: multifocal pneumonia, influenza A, sepsis, hypoxia Additional comments: imaging during hospitalization notable for new irregular masslike opacity in the right middle lobe worrisome for metastasis and new very small sclerotic lesion within the sternum and the body of T4, worrisome for metastasis   12/29/2017 Imaging   CT ABD/PELVIS IMPRESSION: No  definite acute intra-abdominal or intrapelvic abnormalities.   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   12/30/2017 Imaging   CT HEAD IMPRESSION: No acute intracranial abnormality or enhancing lesion.   01/16/2018 - 01/22/2019 Chemotherapy   Maintence Herceptin and Perjeta every 3 weeks for 6-12 months starting 01/16/18. Held 03/2018-07/17/18 due to low EF. Completed on 01/22/19    02/04/2018 PET scan   IMPRESSION: 1. The previous consolidation in the right middle lobe currently has a more nodular and reticular appearance with some associated mild atelectasis along its anterior margin. This demonstrates low-grade metabolic activity with maximum SUV 3.2 (background blood pool activity is 2.7). Although conceivably related to low-grade residuum from the patient's malignancy, the presence of calcified right hilar lymph nodes and small calcifications along the nodularity raise suspicion for a low-grade active granulomatous process or atypical infectious process. Surveillance is recommended. 2. Scattered deposits of hypermetabolic but benign metabolically active brown fat.   02/23/2018 - 04/09/2018 Radiation Therapy   Radiation treatment dates:   02/23/2018 - 04/09/2018   Site/dose:   The patient initially received a dose of 50.4 Gy in 28 fractions to the right chest wall and supraclavicular region. This was delivered using a 3-D conformal, 4 field technique. The patient then received a boost to the mastectomy scar. This delivered an additional 10 Gy in 5 fractions using an en face electron field. The total dose was 60.4 Gy.   Narrative: The patient tolerated radiation treatment relatively well.   The patient had some expected skin irritation as she progressed during treatment. Moist desquamation was  not present at the end of treatment.   03/20/2018 -  Anti-estrogen oral therapy   -Monthly Zoladex injection starting 03/20/18  -Letrozole 2.5mg  once dialy starting  05/03/18  -Switched to Anastrozole starting 07/2018 Due to her worsening back pain. -Changed to Exemestane in 04/2019 due to back pain exacerbation.    04/06/2018 Imaging   CT Chest wo contrast IMPRESSION: 1. Stable linear nodular thickening in the RIGHT middle lobe is favored post infectious or inflammatory scarring. 2. Calcified RIGHT hilar lymph node is consistent post infectious process. 3. Stable small LEFT upper lobe nodule. 4. Stable sclerotic lesion in the manubrium. Sclerotic lesion at T3 is more prominent (4 mm). Sclerotic lesion at L 1 is new from 07/02/2017.   04/06/2018 Imaging   04/06/2018 CT Chest IMPRESSION: 1. Stable linear nodular thickening in the RIGHT middle lobe is favored post infectious or inflammatory scarring. 2. Calcified RIGHT hilar lymph node is consistent post infectious process. 3. Stable small LEFT upper lobe nodule. 4. Stable sclerotic lesion in the manubrium. Sclerotic lesion at T3 is more prominent (4 mm). Sclerotic lesion at L 1 is new from 07/02/2017.   04/10/2018 Echocardiogram   LV EF 45%-50%   05/25/2018 Echocardiogram   LV EF 45%-50%   07/13/2018 Echocardiogram   Normal LV size with EF 55-60%   07/31/2018 Imaging   07/31/2018 Bone Scan IMPRESSION: No evidence of skeletal metastatic disease.   07/31/2018 Imaging   07/31/2018 CT CAP IMPRESSION: Residual linear and nodular peribronchovascular opacities in the right middle lobe, with other scattered areas of ground-glass opacities in the right middle, right upper and right lower lobes, which are located within the previously demonstrated bulky airspace consolidation. The findings are improved from the chest CT dated 12/30/2017, and relatively stable from PET-CT dated 02/04/2018. These may represent post infectious/post inflammatory changes or residual malignancy.   Stable partially calcified right hilar lymphadenopathy.   Interval development of nodular soft tissue thickening within  the anterior mediastinum in the area of the thymus, new from chest CT dated 12/30/2017. This may represent reactive thymic tissue, thymoma or lymphadenopathy.   1.8 cm probably physiologic left ovarian cyst. Borderline thickening of the endometrium measuring 15.6 mm. Further evaluation with pelvic ultrasound may be considered.   No new or suspicious findings within the abdomen.   09/15/2018 Echocardiogram   EF 55-60%   01/28/2019 Imaging   MRI Lumbar spie 01/28/19  IMPRESSION: 1. Subcentimeter lesions in the L1 and L2 vertebral bodies, indeterminate for metastases. 2. Mild lower lumbar disc degeneration without stenosis.   01/28/2019 Imaging   CT chest 01/28/19  IMPRESSION: Right lung post radiation changes. No active lung disease or other acute findings.   No evidence of recurrent or metastatic carcinoma within the thorax.   03/15/2019 - 02/2020 Chemotherapy   Anti-HER2 Neratinib 240 mg daily, starting 03/15/19. She completed 1 year of treatment in 02/2020      INTERVAL HISTORY:  Sheila [name] is here for a follow up of  right breast cancer . She was last seen by me on 08/09/2022. She presents to the clinic accompanied by husband. Pt had surgery and she is clinically doing well. Pt denied having any joint pain while on Exemestane.Pt denied having any bleeding.      All other systems were reviewed with the patient and are negative.  MEDICAL HISTORY:  Past Medical History:  Diagnosis Date   Anxiety    Cancer [facility])    right breast    Cardiomyopathy (HCC)  Depression    Family history of breast cancer    Family history of colon cancer    GERD (gastroesophageal reflux disease)    Headache    couple of migraines in the past   History of kidney stones    Pneumonia    Vitamin D deficiency     SURGICAL HISTORY: Past Surgical History:  Procedure Laterality Date   egg donation     kidney stone removal     MASS EXCISION Right 08/19/2019   Procedure: REMOVE EXCESS  TISSUE FROM RIGHT LATERAL MASTECTOMY SCAR;  Surgeon: Emelia Loron, MD;  Location: [facility];  Service: General;  Laterality: Right;   MASTECTOMY W/ SENTINEL NODE BIOPSY Bilateral 07/22/2017   Procedure: RIGHT TOTAL MASTECTOMY WITH RIGHT AXILLARY SENTINEL LYMPH NODE BIOPSY, LEFT PROPHYLACTIC MASTECTOMY;  Surgeon: Emelia Loron, MD;  Location: [facility] OR;  Service: General;  Laterality: Bilateral;   PORT-A-CATH REMOVAL Right 08/19/2019   Procedure: REMOVAL PORT-A-CATH;  Surgeon: Emelia Loron, MD;  Location: [facility];  Service: General;  Laterality: Right;   PORTACATH PLACEMENT Right 07/22/2017   Procedure: INSERTION PORT-A-CATH WITH [name];  Surgeon: Emelia Loron, MD;  Location: [facility] OR;  Service: General;  Laterality: Right;   ROBOTIC ASSISTED BILATERAL SALPINGO OOPHERECTOMY Bilateral 09/03/2022   Procedure: XI ROBOTIC ASSISTED BILATERAL SALPINGO OOPHORECTOMY;  Surgeon: Clide Cliff, MD;  Location: [facility];  Service: Gynecology;  Laterality: Bilateral;    I have reviewed the social history and family history with the patient and they are unchanged from previous note.  ALLERGIES:  is allergic to eszopiclone.  MEDICATIONS:  Current Outpatient Medications  Medication Sig Dispense Refill   acetaminophen (TYLENOL) 500 MG tablet Take 1,000 mg by mouth every 6 (six) hours as needed for moderate pain.     B Complex-C (B-COMPLEX WITH VITAMIN C) tablet Take 1 tablet by mouth daily.     calcium-vitamin D (OSCAL WITH D) 250-125 MG-UNIT tablet Take 1 tablet by mouth daily.     cetirizine (ZYRTEC) 10 MG tablet Take 10 mg by mouth daily.     Cholecalciferol (VITAMIN D) 2000 units tablet Take 2,000 Units by mouth daily.     Cholecalciferol (VITAMIN D3) 1.25 MG (50000 UT) CAPS Take 50,000 Units by mouth every 7 (seven) days.     exemestane (AROMASIN) 25 MG tablet TAKE 1 TABLET BY MOUTH EVERY DAY AFTER BREAKFAST 90 tablet 3   fluticasone (FLONASE) 50 MCG/ACT nasal  spray Place 1 spray into both nostrils daily as needed for allergies.     oxyCODONE (OXY IR/ROXICODONE) 5 MG immediate release tablet Take 1 tablet (5 mg total) by mouth every 4 (four) hours as needed for severe pain. For AFTER surgery only, do not take and drive 15 tablet 0   senna-docusate (SENOKOT-S) 8.6-50 MG tablet Take 2 tablets by mouth at bedtime. For AFTER surgery, do not take if having diarrhea 30 tablet 0   venlafaxine XR (EFFEXOR-XR) 75 MG 24 hr capsule TAKE 3 CAPSULES(225 MG) BY MOUTH DAILY 90 capsule 0   No current facility-administered medications for this visit.    PHYSICAL EXAMINATION: ECOG PERFORMANCE STATUS: 0 - Asymptomatic  Vitals:   04/04/23 1500  BP: 118/73  Pulse: (!) 113  Resp: 18  Temp: 99 F (37.2 C)  SpO2: 100%   Wt Readings from Last 3 Encounters:  04/04/23 165 lb 6.4 oz (75 kg)  09/23/22 159 lb 9.6 oz (72.4 kg)  09/03/22 162 lb 0.6 oz (73.5 kg)  GENERAL:alert, no distress and comfortable SKIN: skin color, texture, turgor are normal, no rashes or significant lesions EYES: normal, Conjunctiva are pink and non-injected, sclera clear NECK: (-)supple, thyroid normal size, non-tender, without nodularity LYMPH: (-)  no palpable lymphadenopathy in the cervical, axillary  ABDOMEN:(-)abdomen soft, (-) non-tender and normal bowel sounds RT Breast  Mastectomy no palpable mass, Lt Mastectomy no palpable mass breast exam benign. LABORATORY DATA:  I have reviewed the data as listed    Latest Ref Rng & Units 04/04/2023    2:38 PM 08/27/2022    8:16 AM 08/09/2022    2:54 PM  CBC  WBC 4.0 - 10.5 K/uL 5.7  4.0  5.9   Hemoglobin 12.0 - 15.0 g/dL 28.4  13.2  44.0   Hematocrit 36.0 - 46.0 % 35.4  37.0  38.1   Platelets 150 - 400 K/uL 244  214  241         Latest Ref Rng & Units 08/27/2022    8:16 AM 08/09/2022    2:54 PM 02/22/2022    2:50 PM  CMP  Glucose 70 - 99 mg/dL 102  725  366   BUN 6 - 20 mg/dL 14  9  11    Creatinine 0.44 - 1.00 mg/dL 4.40  3.47   4.25   Sodium 135 - 145 mmol/L 139  141  140   Potassium 3.5 - 5.1 mmol/L 3.7  4.3  4.3   Chloride 98 - 111 mmol/L 108  106  105   CO2 22 - 32 mmol/L 24  30  26    Calcium 8.9 - 10.3 mg/dL 9.2  9.6  9.9   Total Protein 6.5 - 8.1 g/dL 7.1  7.5  7.9   Total Bilirubin 0.3 - 1.2 mg/dL 0.6  0.4  0.6   Alkaline Phos 38 - 126 U/L 88  114  117   AST 15 - 41 U/L 18  14  14    ALT 0 - 44 U/L 13  13  10        RADIOGRAPHIC STUDIES: I have personally reviewed the radiological images as listed and agreed with the findings in the report. No results found.    No orders of the defined types were placed in this encounter.  All questions were answered. The patient knows to call the clinic with any problems, questions or concerns. No barriers to learning was detected. The total time spent in the appointment was 20 minutes.     Salome Holmes, CMA 04/04/2023   I, Monica Martinez, CMA, am acting as scribe for Malachy Mood, MD.   I have reviewed the above documentation for accuracy and completeness, and I agree with the above.

## 2023-04-04 NOTE — Assessment & Plan Note (Addendum)
mixed invasive ductal and lobular carcinoma, pT3(m)N2aM0, stage 1b, ductal carcinoma triple positive, lobular carcinoma ER+/PR+/HER2-,  Grade 2 -diagnosed in 05/2017. S/p b/l mastectomy, adjuvant TCHP, radiation, maintenance Herceptin/Perjeta, and 1 year of Neratinib.  -She started adjuvant letrozole with monthly Zoladex injections in 04/2018.  Letrozole was subsequently changed to anastrozole, then exemestane due to some joint and back pain. She tolerates exemestane well with mild hot flashes, tolerable.  -she had BSO in 08/2022

## 2023-04-05 LAB — CANCER ANTIGEN 27.29: CA 27.29: 12.2 U/mL (ref 0.0–38.6)

## 2023-04-06 ENCOUNTER — Other Ambulatory Visit: Payer: Self-pay | Admitting: Hematology

## 2023-04-21 ENCOUNTER — Other Ambulatory Visit: Payer: Self-pay | Admitting: Hematology

## 2023-07-09 DIAGNOSIS — J302 Other seasonal allergic rhinitis: Secondary | ICD-10-CM | POA: Diagnosis not present

## 2023-07-09 DIAGNOSIS — E7800 Pure hypercholesterolemia, unspecified: Secondary | ICD-10-CM | POA: Diagnosis not present

## 2023-07-09 DIAGNOSIS — E559 Vitamin D deficiency, unspecified: Secondary | ICD-10-CM | POA: Diagnosis not present

## 2023-07-09 DIAGNOSIS — E039 Hypothyroidism, unspecified: Secondary | ICD-10-CM | POA: Diagnosis not present

## 2023-07-09 DIAGNOSIS — Z0000 Encounter for general adult medical examination without abnormal findings: Secondary | ICD-10-CM | POA: Diagnosis not present

## 2023-07-09 DIAGNOSIS — D509 Iron deficiency anemia, unspecified: Secondary | ICD-10-CM | POA: Diagnosis not present

## 2023-07-09 DIAGNOSIS — K219 Gastro-esophageal reflux disease without esophagitis: Secondary | ICD-10-CM | POA: Diagnosis not present

## 2023-07-09 DIAGNOSIS — R7303 Prediabetes: Secondary | ICD-10-CM | POA: Diagnosis not present

## 2023-07-15 ENCOUNTER — Ambulatory Visit
Admission: RE | Admit: 2023-07-15 | Discharge: 2023-07-15 | Disposition: A | Discharge status: Home / Self Care | Payer: BC Managed Care – PPO | Source: Ambulatory Visit | Attending: Hematology | Admitting: Hematology

## 2023-07-15 DIAGNOSIS — N958 Other specified menopausal and perimenopausal disorders: Secondary | ICD-10-CM | POA: Diagnosis not present

## 2023-07-15 DIAGNOSIS — Z853 Personal history of malignant neoplasm of breast: Secondary | ICD-10-CM | POA: Diagnosis not present

## 2023-07-15 DIAGNOSIS — E2839 Other primary ovarian failure: Secondary | ICD-10-CM

## 2024-01-07 ENCOUNTER — Inpatient Hospital Stay: Payer: BC Managed Care – PPO

## 2024-01-07 ENCOUNTER — Inpatient Hospital Stay: Payer: BC Managed Care – PPO | Admitting: Hematology

## 2024-01-08 ENCOUNTER — Inpatient Hospital Stay: Payer: BC Managed Care – PPO | Attending: Hematology

## 2024-01-08 DIAGNOSIS — D649 Anemia, unspecified: Secondary | ICD-10-CM | POA: Diagnosis not present

## 2024-01-08 DIAGNOSIS — Z1732 Human epidermal growth factor receptor 2 negative status: Secondary | ICD-10-CM | POA: Insufficient documentation

## 2024-01-08 DIAGNOSIS — Z170 Estrogen receptor positive status [ER+]: Secondary | ICD-10-CM | POA: Insufficient documentation

## 2024-01-08 DIAGNOSIS — C50411 Malignant neoplasm of upper-outer quadrant of right female breast: Secondary | ICD-10-CM | POA: Insufficient documentation

## 2024-01-08 DIAGNOSIS — Z79811 Long term (current) use of aromatase inhibitors: Secondary | ICD-10-CM | POA: Diagnosis not present

## 2024-01-08 DIAGNOSIS — Z1721 Progesterone receptor positive status: Secondary | ICD-10-CM | POA: Diagnosis not present

## 2024-01-08 DIAGNOSIS — Z9013 Acquired absence of bilateral breasts and nipples: Secondary | ICD-10-CM | POA: Insufficient documentation

## 2024-01-08 DIAGNOSIS — Z923 Personal history of irradiation: Secondary | ICD-10-CM | POA: Diagnosis not present

## 2024-01-08 LAB — CBC WITH DIFFERENTIAL (CANCER CENTER ONLY)
Abs Immature Granulocytes: 0.01 10*3/uL (ref 0.00–0.07)
Basophils Absolute: 0.1 10*3/uL (ref 0.0–0.1)
Basophils Relative: 1 %
Eosinophils Absolute: 0.2 10*3/uL (ref 0.0–0.5)
Eosinophils Relative: 3 %
HCT: 36.6 % (ref 36.0–46.0)
Hemoglobin: 11.4 g/dL — ABNORMAL LOW (ref 12.0–15.0)
Immature Granulocytes: 0 %
Lymphocytes Relative: 32 %
Lymphs Abs: 2 10*3/uL (ref 0.7–4.0)
MCH: 25.3 pg — ABNORMAL LOW (ref 26.0–34.0)
MCHC: 31.1 g/dL (ref 30.0–36.0)
MCV: 81.2 fL (ref 80.0–100.0)
Monocytes Absolute: 0.5 10*3/uL (ref 0.1–1.0)
Monocytes Relative: 8 %
Neutro Abs: 3.4 10*3/uL (ref 1.7–7.7)
Neutrophils Relative %: 56 %
Platelet Count: 277 10*3/uL (ref 150–400)
RBC: 4.51 MIL/uL (ref 3.87–5.11)
RDW: 14.9 % (ref 11.5–15.5)
WBC Count: 6.1 10*3/uL (ref 4.0–10.5)
nRBC: 0 % (ref 0.0–0.2)

## 2024-01-08 LAB — CMP (CANCER CENTER ONLY)
ALT: 12 U/L (ref 0–44)
AST: 14 U/L — ABNORMAL LOW (ref 15–41)
Albumin: 4.4 g/dL (ref 3.5–5.0)
Alkaline Phosphatase: 98 U/L (ref 38–126)
Anion gap: 6 (ref 5–15)
BUN: 11 mg/dL (ref 6–20)
CO2: 28 mmol/L (ref 22–32)
Calcium: 9.2 mg/dL (ref 8.9–10.3)
Chloride: 105 mmol/L (ref 98–111)
Creatinine: 0.96 mg/dL (ref 0.44–1.00)
GFR, Estimated: >60 mL/min (ref >60)
Glucose, Bld: 94 mg/dL (ref 70–99)
Potassium: 4 mmol/L (ref 3.5–5.1)
Sodium: 139 mmol/L (ref 135–145)
Total Bilirubin: 0.3 mg/dL (ref 0.0–1.2)
Total Protein: 7.3 g/dL (ref 6.5–8.1)

## 2024-01-08 NOTE — Assessment & Plan Note (Signed)
mixed invasive ductal and lobular carcinoma, pT3(m)N2aM0, stage 1b, ductal carcinoma triple positive, lobular carcinoma ER+/PR+/HER2-,  Grade 2 -diagnosed in 05/2017. S/p b/l mastectomy, adjuvant TCHP, radiation, maintenance Herceptin/Perjeta, and 1 year of Neratinib.  -She started adjuvant letrozole with monthly Zoladex injections in 04/2018.  Letrozole was subsequently changed to anastrozole, then exemestane due to some joint and back pain. She tolerates exemestane well with mild hot flashes, tolerable. plan for a total of 10 years until 04/2028 -she had BSO in 08/2022

## 2024-01-09 LAB — CANCER ANTIGEN 27.29: CA 27.29: 12.9 U/mL (ref 0.0–38.6)

## 2024-01-12 ENCOUNTER — Inpatient Hospital Stay (HOSPITAL_BASED_OUTPATIENT_CLINIC_OR_DEPARTMENT_OTHER): Payer: BC Managed Care – PPO | Admitting: Hematology

## 2024-01-12 ENCOUNTER — Encounter: Payer: Self-pay | Admitting: Hematology

## 2024-01-12 DIAGNOSIS — C50411 Malignant neoplasm of upper-outer quadrant of right female breast: Secondary | ICD-10-CM

## 2024-01-12 DIAGNOSIS — Z170 Estrogen receptor positive status [ER+]: Secondary | ICD-10-CM | POA: Diagnosis not present

## 2024-01-12 NOTE — Progress Notes (Signed)
 [facility]   Telephone:([phone]) [phone] Fax:(336) [phone]   Clinic Follow up Note   Patient Care Team: Soundra Pilon, FNP as PCP - General (Family Medicine) Delano Metz MD (Family Medicine) Emelia Loron, MD as Consulting Physician (General Surgery) Malachy Mood, MD as Consulting Physician (Hematology) Dorothy Puffer, MD as Consulting Physician (Radiation Oncology) 01/12/2024  I connected with Sheila [name] on 01/12/24 at  8:40 AM EST by telephone and verified that I am speaking with the correct person using two identifiers.   I discussed the limitations, risks, security and privacy concerns of performing an evaluation and management service by telephone and the availability of in person appointments. I also discussed with the patient that there may be a patient responsible charge related to this service. The patient expressed understanding and agreed to proceed.   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 S/p b/l mastectomy, adjuvant TCHP, radiation, maintenance Herceptin/Perjeta, and 1 year of Neratinib.  -She started adjuvant letrozole with monthly Zoladex injections in 04/2018.  Letrozole was subsequently changed to anastrozole, then exemestane due to some joint and back pain. She tolerates exemestane well with mild hot flashes, tolerable. plan for a total of 10 years until 04/2028 -she had BSO in 08/2022   Assessment and Plan    Breast Cancer (Post-Treatment Follow-Up) 48 year old female, status post double mastectomy in October 2023, on exemestane since 2020 following prior  letrozole therapy. No new symptoms or concerns. Mild anemia with hemoglobin at 11.4, otherwise normal labs including kidney and liver function and tumor markers. No significant side effects from exemestane except occasional mild hot flashes. Normal bone density as of August 2024. Discussed weight-bearing exercise and vitamin D supplementation to counteract bone weakening effects of exemestane. - Continue exemestane - Encourage weight-bearing exercise - Consider vitamin D supplementation - Monitor bone density regularly - Follow up in one year  Mild Anemia Mild anemia with hemoglobin at 11.4. No active bleeding or menstruation. No symptoms of fatigue or dyspnea. Likely not significant but will monitor. Discussed potential benefits of prenatal vitamins containing iron, B12, and folate. - Recommend prenatal vitamin with iron, B12, and folate once daily - Check blood counts during summer visit with family doctor - Contact if anemia worsens or symptoms develop  General Health Maintenance Advised to consider colon cancer screening as per updated guidelines. - Discuss colonoscopy for colon cancer screening with family doctor during summer visit  Plan -She is clinically doing well, no concern for cancer recurrence -Continue exemestane - Schedule follow-up appointment in one year - Ensure lab work is done during summer visit with family doctor, for anemia follow-up - Contact if any new symptoms or concerns arise.        SUMMARY OF ONCOLOGIC HISTORY: Oncology History Overview Note   Cancer Staging Malignant neoplasm of upper-outer quadrant of right breast in female, estrogen receptor positive (HCC) Staging form: Breast, AJCC 8th Edition - Clinical stage from 06/11/2017: Stage IB (cT3, cN0, cM0, G2, ER: Positive, PR: Positive, HER2: Positive) - Signed by Malachy Mood, MD on 06/16/2017 - Pathologic stage from 07/22/2017: Stage IB (pT3(m), pN2a(sn), cM0, G2, ER: Positive, PR: Positive, HER2: Positive)  - Signed by Azucena Cecil,  Mable Fill, NP on 08/04/2017    Malignant neoplasm of upper-outer quadrant of right breast in female, estrogen receptor positive (HCC)  06/11/2017 Initial Biopsy   Diagnosis 06/11/17 Breast, right, needle core biopsy, 11:00 o'clock - INVASIVE DUCTAL CARCINOMA, G2   06/11/2017 Mammogram   [name] and [facility] Diagnostic Breast Tomo Bilateral 06/11/17 IMPRESSION: 1. Highly suspicious mass within the retroareolar right breast, extending from the 8:00 to 11:00 axes, anterior to posterior depth, extending anteriorly to the nipple with associated nipple retraction, measuring at least 5.5 cm by ultrasound   2. No sonographic evidence of metastatic lymphadenopathy in the right axilla.  3. No evidence of malignancy within the left breast.    06/11/2017 Initial Diagnosis   Malignant neoplasm of upper-outer quadrant of right breast in female, estrogen receptor positive (HCC)   06/11/2017 Receptors her2   Estrogen Receptor: 100%, POSITIVE, STRONG STAINING INTENSITY Progesterone Receptor: 100%, POSITIVE, STRONG STAINING INTENSITY Proliferation Marker Ki67: 12%Proliferation Marker Ki67: 12%  HER2 - **POSITIVE** RATIO OF HER2/CEP17 SIGNALS 2.55 AVERAGE HER2 COPY NUMBER PER CELL 4.85   06/25/2017 Genetic Testing   Negative genetic testing on the 9 gene STAT panel.  The STAT Breast cancer panel offered by [facility] includes sequencing and rearrangement analysis for the following 9 genes:  ATM, BRCA1, BRCA2, CDH1, CHEK2, PALB2, PTEN, STK11 and TP53.   The report date is June 25, 2017.  Negative genetic testing on the common hereditary cancer panel.  The Hereditary Gene Panel offered by [facility] includes sequencing and/or deletion duplication testing of the following 46 genes: APC, ATM, AXIN2, BARD1, BMPR1A, BRCA1, BRCA2, BRIP1, CDH1, CDKN2A (p14ARF), CDKN2A (p16INK4a), CHEK2, CTNNA1, DICER1, EPCAM (Deletion/duplication testing only), GREM1 (promoter region deletion/duplication testing only), KIT, MEN1,  MLH1, MSH2, MSH3, MSH6, MUTYH, NBN, NF1, NHTL1, PALB2, PDGFRA, PMS2, POLD1, [name], PTEN, RAD50, RAD51C, RAD51D, SDHB, SDHC, SDHD, SMAD4, SMARCA4. STK11, TP53, TSC1, TSC2, and VHL.  The following genes were evaluated for sequence changes only: SDHA and HOXB13 c.251G>A variant only.  The report date is June 25, 2017.    07/02/2017 Imaging   CT cAP 07/02/17 IMPRESSION: 1. Subareolar right breast mass. No compelling findings of nodal or metastatic involvement. 2. Old granulomatous disease. 3. There is a 7 mm enhancing or hyperdense focus posteriorly in segment 7 of the liver in the subcapsular region. By virtue of its small size this lesion is technically nonspecific although statistically likely to be a small benign lesions such as flash filling hemangioma. This may warrant surveillance. 4.  Prominent stool throughout the colon favors constipation. 5. Degenerative disc disease and spondylosis at L5-S1 likely causing mild impingement.   07/02/2017 Imaging   Bone scan 07/02/17 IMPRESSION: Today' s exam is negative. I ascribed the tiny focus of activity just proximal to the left antecubital region to injection site.   07/22/2017 Surgery   RIGHT TOTAL MASTECTOMY WITH RIGHT AXILLARY SENTINEL LYMPH NODE BIOPSY, LEFT PROPHYLACTIC MASTECTOM and INSERTION PORT-A-CATH WITH [name] by Dr. Dwain Sarna and [facility]    07/22/2017 Pathology Results   Diagnosis 1. Breast, simple mastectomy, Left - FIBROADENOMA. - NO MALIGNANCY IDENTIFIED. 2. 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 - ADDITIONAL FOCUS OF LOBULAR CARCINOMA, GRADE 2, SPANNING 2.2 CM. - INTERMEDIATE GRADE DUCTAL CARCINOMA IN SITU. - INVASIVE CARCINOMA COMES TO WITHIN 0.2 TO 0.3 CM OF THE DEEP MARGIN, FOCALLY. - TUMOR FOCALLY INVOLVES EPIDERMIS. - LYMPHOVASCULAR INVASION OF DERMAL LYMPHATICS. - ONE OF ONE LYMPH NODES NEGATIVE FOR CARCINOMA (0/1). - SEE  ONCOLOGY TABLE. 3. Lymph  node, sentinel, biopsy, Right axillary - ONE LYMPH NODE WITH ISOLATED TUMOR CELLS (0/1). 4. Lymph node, sentinel, biopsy, Right - METASTATIC CARCINOMA IN ONE OF ONE LYMPH NODES (1/1). 5. Lymph node, sentinel, biopsy, Right - ONE OF ONE LYMPH NODES NEGATIVE FOR CARCINOMA (0/1). 6. Lymph node, sentinel, biopsy, Right - METASTATIC CARCINOMA IN ONE OF ONE LYMPH NODES (1/1). - EXTRACAPSULAR EXTENSION. 7. Lymph node, sentinel, biopsy, Right - METASTATIC CARCINOMA IN ONE OF ONE LYMPH NODES (1/1). - EXTRACAPSULAR EXTENSION. 8. Lymph node, sentinel, biopsy, Right - ONE OF ONE LYMPH NODES NEGATIVE FOR CARCINOMA (0/1). 9. Lymph node, sentinel, biopsy, Right - ONE OF ONE LYMPH NODES NEGATIVE FOR CARCINOMA (0/1). 10. Lymph node, sentinel, biopsy, Right - METASTATIC CARCINOMA IN ONE OF ONE LYMPH NODES (1/1). - EXTRACAPSULAR EXTENSION. 11. Lymph node, biopsy, Right axillary - ONE OF ONE LYMPH NODES NEGATIVE FOR CARCINOMA (0/1).    07/22/2017 Receptors her2   Ductal carcinoma in primary breast tumor and node metastasis are ER 95%, PR 95% strongly positive, and HER2 + The lobular component in primary breast tumor is ER 95% positive, PR 95% positive, strong staining, HER-2 negative.   Original biopsy [id number]: Ductal morphology, Her2 FISH positive. Current specimen: Main focus is 75% ductal morphology, 25% lobular morphology. Smaller focus is lobular morphology. #2A Lobular morphology, Her IHC negative. #2E Lobular morphology, Her2 FISH negative, Her2 IHC negative. #4 50% lobular and 50% ductal morphology, Her2 FISH positive, Her2 IHC negative. #6 Ductal morphology #7 Ductal morphology #10 50% lobular, 50% ductal morphology   08/25/2017 Imaging   MRI abdomen [facility] at [facility]:  Enhancing structure measing 5 mm is compatible with a flash filling hemangioma. No specific findings to suggest metastatic cancer   08/25/2017 Imaging   MRI Abdomen at [facility] 08/25/17  IMPRESSION: 1. Lesion in segment 7  of the liver has signal and enhancement characteristics compatible with a flash filling hemangioma. No specific findings identified to suggest metastatic disease.   08/29/2017 - 12/12/2017 Chemotherapy   adjuvant TCHP every 3 weeks for 6 cycles starting 08/29/17. Postponed and decreased Carbo to 650mg  and added Onpro with cycle 2 on 09/19/17 due to neutropenic fever. Carbplatin further reduced to 500mg  starting with cycle 3 due to thrombocytopenia. Herceptin was held for Cycle 4 and 5 due to worsening global longitudinal strain found on ECHO from 11/07/17. Completed 6 cycles on 12/12/17      09/04/2017 - 09/09/2017 Hospital Admission   Admit date: 09/04/2017 Discharge date: 09/09/2017   DISCHARGE DIAGNOSES:  Principal Problem:   Sepsis [facility]) Active Problems:   Malignant neoplasm of upper-outer quadrant of right breast in female, estrogen receptor positive (HCC)   Port-A-Cath in place   Neutropenic fever (HCC)   Hyponatremia   Malignant neoplasm of breast in female, estrogen receptor positive (HCC)     12/28/2017 Imaging   CT CHEST IMPRESSION: 1. 2.3 x 2.7 x 1.2 cm irregular masslike opacity within the right middle lobe worrisome for malignancy/metastasis. 2. 3 new very small sclerotic lesions, within the sternum and the body of T4, worrisome for new metastases.   12/28/2017 - 01/05/2018 Hospital Admission   Admit date: 12/28/17-01/05/18 Admission diagnosis: multifocal pneumonia, influenza A, sepsis, hypoxia Additional comments: imaging during hospitalization notable for new irregular masslike opacity in the right middle lobe worrisome for metastasis and new very small sclerotic lesion within the sternum and the body of T4, worrisome for metastasis   12/29/2017 Imaging   CT ABD/PELVIS IMPRESSION: No definite acute intra-abdominal  or intrapelvic abnormalities.   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   12/30/2017 Imaging   CT HEAD IMPRESSION: No  acute intracranial abnormality or enhancing lesion.   01/16/2018 - 01/22/2019 Chemotherapy   Maintence Herceptin and Perjeta every 3 weeks for 6-12 months starting 01/16/18. Held 03/2018-07/17/18 due to low EF. Completed on 01/22/19    02/04/2018 PET scan   IMPRESSION: 1. The previous consolidation in the right middle lobe currently has a more nodular and reticular appearance with some associated mild atelectasis along its anterior margin. This demonstrates low-grade metabolic activity with maximum SUV 3.2 (background blood pool activity is 2.7). Although conceivably related to low-grade residuum from the patient's malignancy, the presence of calcified right hilar lymph nodes and small calcifications along the nodularity raise suspicion for a low-grade active granulomatous process or atypical infectious process. Surveillance is recommended. 2. Scattered deposits of hypermetabolic but benign metabolically active brown fat.   02/23/2018 - 04/09/2018 Radiation Therapy   Radiation treatment dates:   02/23/2018 - 04/09/2018   Site/dose:   The patient initially received a dose of 50.4 Gy in 28 fractions to the right chest wall and supraclavicular region. This was delivered using a 3-D conformal, 4 field technique. The patient then received a boost to the mastectomy scar. This delivered an additional 10 Gy in 5 fractions using an en face electron field. The total dose was 60.4 Gy.   Narrative: The patient tolerated radiation treatment relatively well.   The patient had some expected skin irritation as she progressed during treatment. Moist desquamation was not present at the end of treatment.   03/20/2018 -  Anti-estrogen oral therapy   -Monthly Zoladex injection starting 03/20/18  -Letrozole 2.5mg  once dialy starting 05/03/18  -Switched to Anastrozole starting 07/2018 Due to her worsening back pain. -Changed to Exemestane in 04/2019 due to back pain exacerbation.    04/06/2018 Imaging   CT Chest wo  contrast IMPRESSION: 1. Stable linear nodular thickening in the RIGHT middle lobe is favored post infectious or inflammatory scarring. 2. Calcified RIGHT hilar lymph node is consistent post infectious process. 3. Stable small LEFT upper lobe nodule. 4. Stable sclerotic lesion in the manubrium. Sclerotic lesion at T3 is more prominent (4 mm). Sclerotic lesion at L 1 is new from 07/02/2017.   04/06/2018 Imaging   04/06/2018 CT Chest IMPRESSION: 1. Stable linear nodular thickening in the RIGHT middle lobe is favored post infectious or inflammatory scarring. 2. Calcified RIGHT hilar lymph node is consistent post infectious process. 3. Stable small LEFT upper lobe nodule. 4. Stable sclerotic lesion in the manubrium. Sclerotic lesion at T3 is more prominent (4 mm). Sclerotic lesion at L 1 is new from 07/02/2017.   04/10/2018 Echocardiogram   LV EF 45%-50%   05/25/2018 Echocardiogram   LV EF 45%-50%   07/13/2018 Echocardiogram   Normal LV size with EF 55-60%   07/31/2018 Imaging   07/31/2018 Bone Scan IMPRESSION: No evidence of skeletal metastatic disease.   07/31/2018 Imaging   07/31/2018 CT CAP IMPRESSION: Residual linear and nodular peribronchovascular opacities in the right middle lobe, with other scattered areas of ground-glass opacities in the right middle, right upper and right lower lobes, which are located within the previously demonstrated bulky airspace consolidation. The findings are improved from the chest CT dated 12/30/2017, and relatively stable from PET-CT dated 02/04/2018. These may represent post infectious/post inflammatory changes or residual malignancy.   Stable partially calcified right hilar lymphadenopathy.   Interval development  of nodular soft tissue thickening within the anterior mediastinum in the area of the thymus, new from chest CT dated 12/30/2017. This may represent reactive thymic tissue, thymoma or lymphadenopathy.   1.8 cm probably  physiologic left ovarian cyst. Borderline thickening of the endometrium measuring 15.6 mm. Further evaluation with pelvic ultrasound may be considered.   No new or suspicious findings within the abdomen.   09/15/2018 Echocardiogram   EF 55-60%   01/28/2019 Imaging   MRI Lumbar spie 01/28/19  IMPRESSION: 1. Subcentimeter lesions in the L1 and L2 vertebral bodies, indeterminate for metastases. 2. Mild lower lumbar disc degeneration without stenosis.   01/28/2019 Imaging   CT chest 01/28/19  IMPRESSION: Right lung post radiation changes. No active lung disease or other acute findings.   No evidence of recurrent or metastatic carcinoma within the thorax.   03/15/2019 - 02/2020 Chemotherapy   Anti-HER2 Neratinib 240 mg daily, starting 03/15/19. She completed 1 year of treatment in 02/2020     Discussed the use of AI scribe software for clinical note transcription with the patient, who gave verbal consent to proceed.  History of Present Illness   A 48 year old patient with a history of breast cancer is scheduled for a phone visit to follow up on her condition. She underwent a double mastectomy in October 2023 and has been on anti-estrogen therapy for almost six years, initially with Letrozole and currently with Exemestane. She reports no new issues in the past six months and denies any problems with Exemestane. She experiences occasional hot flashes, but they are not severe. She has been monitoring her bone density, with the last check in August of the previous year showing normal results. She acknowledges the need for vitamin D supplementation and weight-bearing exercise to maintain bone health, as Exemestane can weaken the bones and she entered menopause early. She denies any pain or other concerns. She also reports taking Effexor and has a supply of Oxycodone, which she is not currently using. She occasionally takes Tylenol for soreness. She also has mild anemia, with a hemoglobin level of 11.4,  but denies any bleeding.         REVIEW OF SYSTEMS:   Constitutional: Denies fevers, chills or abnormal weight loss Eyes: Denies blurriness of vision Ears, nose, mouth, throat, and face: Denies mucositis or sore throat Respiratory: Denies cough, dyspnea or wheezes Cardiovascular: Denies palpitation, chest discomfort or lower extremity swelling Gastrointestinal:  Denies nausea, heartburn or change in bowel habits Skin: Denies abnormal skin rashes Lymphatics: Denies new lymphadenopathy or easy bruising Neurological:Denies numbness, tingling or new weaknesses Behavioral/Psych: Mood is stable, no new changes  All other systems were reviewed with the patient and are negative.  MEDICAL HISTORY:  Past Medical History:  Diagnosis Date   Anxiety    Cancer (HCC)    right breast    Cardiomyopathy (HCC)    Depression    Family history of breast cancer    Family history of colon cancer    GERD (gastroesophageal reflux disease)    Headache    couple of migraines in the past   History of kidney stones    Pneumonia    Vitamin D deficiency     SURGICAL HISTORY: Past Surgical History:  Procedure Laterality Date   egg donation     kidney stone removal     MASS EXCISION Right 08/19/2019   Procedure: REMOVE EXCESS TISSUE FROM RIGHT LATERAL MASTECTOMY SCAR;  Surgeon: Emelia Loron, MD;  Location: [facility];  Service:  General;  Laterality: Right;   MASTECTOMY W/ SENTINEL NODE BIOPSY Bilateral 07/22/2017   Procedure: RIGHT TOTAL MASTECTOMY WITH RIGHT AXILLARY SENTINEL LYMPH NODE BIOPSY, LEFT PROPHYLACTIC MASTECTOMY;  Surgeon: Emelia Loron, MD;  Location: [facility] OR;  Service: General;  Laterality: Bilateral;   PORT-A-CATH REMOVAL Right 08/19/2019   Procedure: REMOVAL PORT-A-CATH;  Surgeon: Emelia Loron, MD;  Location: [facility];  Service: General;  Laterality: Right;   PORTACATH PLACEMENT Right 07/22/2017   Procedure: INSERTION PORT-A-CATH WITH [name];   Surgeon: Emelia Loron, MD;  Location: [facility] OR;  Service: General;  Laterality: Right;   ROBOTIC ASSISTED BILATERAL SALPINGO OOPHERECTOMY Bilateral 09/03/2022   Procedure: XI ROBOTIC ASSISTED BILATERAL SALPINGO OOPHORECTOMY;  Surgeon: Clide Cliff, MD;  Location: [facility];  Service: Gynecology;  Laterality: Bilateral;    I have reviewed the social history and family history with the patient and they are unchanged from previous note.  ALLERGIES:  is allergic to eszopiclone.  MEDICATIONS:  Current Outpatient Medications  Medication Sig Dispense Refill   acetaminophen (TYLENOL) 500 MG tablet Take 1,000 mg by mouth every 6 (six) hours as needed for moderate pain.     B Complex-C (B-COMPLEX WITH VITAMIN C) tablet Take 1 tablet by mouth daily.     calcium-vitamin D (OSCAL WITH D) 250-125 MG-UNIT tablet Take 1 tablet by mouth daily.     cetirizine (ZYRTEC) 10 MG tablet Take 10 mg by mouth daily.     Cholecalciferol (VITAMIN D) 2000 units tablet Take 2,000 Units by mouth daily.     Cholecalciferol (VITAMIN D3) 1.25 MG (50000 UT) CAPS Take 50,000 Units by mouth every 7 (seven) days.     exemestane (AROMASIN) 25 MG tablet TAKE 1 TABLET BY MOUTH EVERY DAY AFTER BREAKFAST 90 tablet 3   fluticasone (FLONASE) 50 MCG/ACT nasal spray Place 1 spray into both nostrils daily as needed for allergies.     senna-docusate (SENOKOT-S) 8.6-50 MG tablet Take 2 tablets by mouth at bedtime. For AFTER surgery, do not take if having diarrhea 30 tablet 0   venlafaxine XR (EFFEXOR-XR) 75 MG 24 hr capsule TAKE 3 CAPSULES(225 MG) BY MOUTH DAILY 90 capsule 5   No current facility-administered medications for this visit.    PHYSICAL EXAMINATION: Not performed   LABORATORY DATA:  I have reviewed the data as listed    Latest Ref Rng & Units 01/08/2024    2:31 PM 04/04/2023    2:38 PM 08/27/2022    8:16 AM  CBC  WBC 4.0 - 10.5 K/uL 6.1  5.7  4.0   Hemoglobin 12.0 - 15.0 g/dL 16.1  09.6  04.5   Hematocrit 36.0 -  46.0 % 36.6  35.4  37.0   Platelets 150 - 400 K/uL 277  244  214         Latest Ref Rng & Units 01/08/2024    2:31 PM 04/04/2023    2:38 PM 08/27/2022    8:16 AM  CMP  Glucose 70 - 99 mg/dL 94  409  811   BUN 6 - 20 mg/dL 11  13  14    Creatinine 0.44 - 1.00 mg/dL 9.14  7.82  9.56   Sodium 135 - 145 mmol/L 139  141  139   Potassium 3.5 - 5.1 mmol/L 4.0  3.8  3.7   Chloride 98 - 111 mmol/L 105  105  108   CO2 22 - 32 mmol/L 28  26  24    Calcium 8.9 - 10.3 mg/dL 9.2  9.5  9.2   Total Protein 6.5 - 8.1 g/dL 7.3  7.4  7.1   Total Bilirubin 0.0 - 1.2 mg/dL 0.3  0.3  0.6   Alkaline Phos 38 - 126 U/L 98  106  88   AST 15 - 41 U/L 14  12  18    ALT 0 - 44 U/L 12  9  13        RADIOGRAPHIC STUDIES: I have personally reviewed the radiological images as listed and agreed with the findings in the report. No results found.     I discussed the assessment and treatment plan with the patient. The patient was provided an opportunity to ask questions and all were answered. The patient agreed with the plan and demonstrated an understanding of the instructions.   The patient was advised to call back or seek an in-person evaluation if the symptoms worsen or if the condition fails to improve as anticipated.  I provided 11 minutes of non face-to-face telephone visit time during this encounter, and > 50% was spent counseling as documented under my assessment & plan.     Malachy Mood, MD 01/12/24

## 2024-03-14 ENCOUNTER — Other Ambulatory Visit: Payer: Self-pay | Admitting: Hematology

## 2024-03-15 ENCOUNTER — Encounter: Payer: Self-pay | Admitting: Hematology

## 2024-05-02 ENCOUNTER — Other Ambulatory Visit: Payer: Self-pay | Admitting: Hematology

## 2024-07-15 DIAGNOSIS — K219 Gastro-esophageal reflux disease without esophagitis: Secondary | ICD-10-CM | POA: Diagnosis not present

## 2024-07-15 DIAGNOSIS — Z6830 Body mass index (BMI) 30.0-30.9, adult: Secondary | ICD-10-CM | POA: Diagnosis not present

## 2024-07-15 DIAGNOSIS — R7303 Prediabetes: Secondary | ICD-10-CM | POA: Diagnosis not present

## 2024-07-15 DIAGNOSIS — E559 Vitamin D deficiency, unspecified: Secondary | ICD-10-CM | POA: Diagnosis not present

## 2024-07-15 DIAGNOSIS — E039 Hypothyroidism, unspecified: Secondary | ICD-10-CM | POA: Diagnosis not present

## 2024-07-15 DIAGNOSIS — E1169 Type 2 diabetes mellitus with other specified complication: Secondary | ICD-10-CM | POA: Diagnosis not present

## 2024-07-15 DIAGNOSIS — J309 Allergic rhinitis, unspecified: Secondary | ICD-10-CM | POA: Diagnosis not present

## 2024-07-15 DIAGNOSIS — Z0000 Encounter for general adult medical examination without abnormal findings: Secondary | ICD-10-CM | POA: Diagnosis not present

## 2024-07-15 DIAGNOSIS — C50919 Malignant neoplasm of unspecified site of unspecified female breast: Secondary | ICD-10-CM | POA: Diagnosis not present

## 2024-07-15 DIAGNOSIS — E7800 Pure hypercholesterolemia, unspecified: Secondary | ICD-10-CM | POA: Diagnosis not present

## 2024-07-15 DIAGNOSIS — D509 Iron deficiency anemia, unspecified: Secondary | ICD-10-CM | POA: Diagnosis not present

## 2024-10-08 ENCOUNTER — Other Ambulatory Visit: Payer: Self-pay | Admitting: Nurse Practitioner

## 2024-10-08 ENCOUNTER — Other Ambulatory Visit: Payer: Self-pay

## 2024-10-08 MED ORDER — VENLAFAXINE HCL ER 75 MG PO CP24
75.0000 mg | ORAL_CAPSULE | Freq: Every day | ORAL | 1 refills | Status: AC
Start: 2024-10-08 — End: ?

## 2024-11-04 ENCOUNTER — Other Ambulatory Visit: Payer: Self-pay | Admitting: Nurse Practitioner

## 2024-11-04 ENCOUNTER — Encounter: Payer: Self-pay | Admitting: Nurse Practitioner

## 2024-12-13 ENCOUNTER — Ambulatory Visit: Payer: BC Managed Care – PPO | Admitting: Nurse Practitioner

## 2024-12-13 ENCOUNTER — Other Ambulatory Visit: Payer: BC Managed Care – PPO

## 2024-12-14 ENCOUNTER — Other Ambulatory Visit: Payer: Self-pay | Admitting: Nurse Practitioner

## 2024-12-14 DIAGNOSIS — Z170 Estrogen receptor positive status [ER+]: Secondary | ICD-10-CM

## 2024-12-14 NOTE — Progress Notes (Unsigned)
 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 S/p b/l mastectomy, adjuvant TCHP, radiation, maintenance Herceptin /Perjeta , and 1 year of Neratinib .  -She started adjuvant letrozole  with monthly Zoladex  injections in 04/2018.  Letrozole  was subsequently changed to anastrozole , then exemestane  due to some joint and back pain. She tolerates exemestane  well with mild hot flashes, tolerable. plan for a total of 10 years until 04/2028 -she had BSO in 08/2022  -12/17/2024 - patient clinically doing well. Continues exemestane  daily. Close breast cancer surveillance with labs and follow up in 6 months, sooner if needed.    Bone health DEXA scan performed on 07/15/2023.  Results were considered normal.  She takes calcium  and vitamin D  every day. Next Dexa Scan should be scheduled later in 2026.   Right breast cancer - triple positive History of bilateral mastectomy. Currently on exemestane  and tolerating well with manageable hot flashes. Ca 27-29 has been normal. Awaiting results from today's visit. No role for breast imaging after mastectomy. Will continue close breast cancer surveillance.   Iron deficiency anemia Mild anemia with hgb 11.4 and Hct 35.6. awaiting results of ferritin level. Will treat with oral or IV iron if indicated.   Plan Labs reviewed.  -mild anemia  -CMP  unremarkable.  -awaiting ferritin results.  -awaiting Ca 27-29. Continue exemestane  daily . Plan for labs and follow up in 6 months, sooner if needed .  The patient understands the plans discussed today and is in agreement with them.  She knows to contact our office if she develops concerns prior to her next appointment.  I provided 25 minutes of face-to-face time during this encounter and > 50% was spent counseling as documented under my assessment and plan.    Powell FORBES Lessen, NP  [facility] - A DEPT OF [facility] [address] [place] [address] Dept: [phone] Dept Fax: [phone]   No orders of the defined types were placed in this encounter.     CHIEF COMPLAINT:  CC: Left breast cancer, ER +  Current Treatment: Exemestane  daily  INTERVAL HISTORY:  Sheila [name] is here today for repeat clinical assessment.  She was last seen by Dr. Lanny on 01/12/2024. She reports some right knee pain which lasted for a few days. This has resolved. Continues to take exemestane  daily with minimal side effects. She does have hot flashes which are manageable. She denies palpable masses or changes along the chest wall. She denies chest pain, chest pressure, or shortness of breath. She denies headaches or visual disturbances. She denies abdominal pain, nausea, vomiting, or changes in bowel or bladder habits.  She denies fevers or chills. She denies pain. Her appetite is good. Her weight has increased 8 pounds over last 1.5 years.  I have reviewed the past medical history, past surgical history, social history and family history with the patient and they are unchanged from previous note.  ALLERGIES:  is allergic  to eszopiclone.  MEDICATIONS:  Current Outpatient Medications  Medication Sig Dispense Refill   exemestane  (AROMASIN ) 25 MG tablet TAKE 1 TABLET(25 MG) BY MOUTH DAILY AFTER BREAKFAST 90 tablet 3   fluticasone  (FLONASE )  50 MCG/ACT nasal spray Place 1 spray into both nostrils daily as needed for allergies.     venlafaxine  XR (EFFEXOR -XR) 75 MG 24 hr capsule Take 3 capsules (225 mg total) by mouth daily. 270 capsule 1   acetaminophen  (TYLENOL ) 500 MG tablet Take 1,000 mg by mouth every 6 (six) hours as needed for moderate pain.     B Complex-C (B-COMPLEX WITH VITAMIN C) tablet Take 1 tablet by mouth daily.     calcium -vitamin D  (OSCAL WITH D) 250-125 MG-UNIT tablet Take 1 tablet by mouth daily.     cetirizine (ZYRTEC) 10 MG tablet Take 10 mg by mouth daily.     Cholecalciferol  (VITAMIN D ) 2000 units tablet Take 2,000 Units by mouth daily.     Cholecalciferol  (VITAMIN D3) 1.25 MG (50000 UT) CAPS Take 50,000 Units by mouth every 7 (seven) days.     senna-docusate (SENOKOT-S) 8.6-50 MG tablet Take 2 tablets by mouth at bedtime. For AFTER surgery, do not take if having diarrhea 30 tablet 0   No current facility-administered medications for this visit.    HISTORY OF PRESENT ILLNESS:   Oncology History Overview Note   Cancer Staging Malignant neoplasm of upper-outer quadrant of right breast in female, estrogen receptor positive (HCC) Staging form: Breast, AJCC 8th Edition - Clinical stage from 06/11/2017: Stage IB (cT3, cN0, cM0, G2, ER: Positive, PR: Positive, HER2: Positive) - Signed by Lanny Callander, MD on 06/16/2017 - Pathologic stage from 07/22/2017: Stage IB (pT3(m), pN2a(sn), cM0, G2, ER: Positive, PR: Positive, HER2: Positive) - Signed by Burton, Lacie K, NP on 08/04/2017    Malignant neoplasm of upper-outer quadrant of right breast in female, estrogen receptor positive (HCC)  06/11/2017 Initial Biopsy   Diagnosis 06/11/17 Breast, right, needle core biopsy, 11:00 o'clock - INVASIVE DUCTAL CARCINOMA, G2   06/11/2017 Mammogram   US  and MM Diagnostic Breast Tomo Bilateral 06/11/17 IMPRESSION: 1. Highly suspicious mass within the retroareolar right breast, extending from the 8:00 to 11:00 axes, anterior to posterior  depth, extending anteriorly to the nipple with associated nipple retraction, measuring at least 5.5 cm by ultrasound   2. No sonographic evidence of metastatic lymphadenopathy in the right axilla.  3. No evidence of malignancy within the left breast.    06/11/2017 Initial Diagnosis   Malignant neoplasm of upper-outer quadrant of right breast in female, estrogen receptor positive (HCC)   06/11/2017 Receptors her2   Estrogen Receptor: 100%, POSITIVE, STRONG STAINING INTENSITY Progesterone Receptor: 100%, POSITIVE, STRONG STAINING INTENSITY Proliferation Marker Ki67: 12%Proliferation Marker Ki67: 12%  HER2 - **POSITIVE** RATIO OF HER2/CEP17 SIGNALS 2.55 AVERAGE HER2 COPY NUMBER PER CELL 4.85   06/25/2017 Genetic Testing   Negative genetic testing on the 9 gene STAT panel.  The STAT Breast cancer panel offered by [facility] includes sequencing and rearrangement analysis for the following 9 genes:  ATM, BRCA1, BRCA2, CDH1, CHEK2, PALB2, PTEN, STK11 and TP53.   The report date is June 25, 2017.  Negative genetic testing on the common hereditary cancer panel.  The Hereditary Gene Panel offered by [facility] includes sequencing and/or deletion duplication testing of the following 46 genes: APC, ATM, AXIN2, BARD1, BMPR1A, BRCA1, BRCA2, BRIP1, CDH1, CDKN2A (p14ARF), CDKN2A (p16INK4a), CHEK2, CTNNA1, DICER1, EPCAM (Deletion/duplication testing only), GREM1 (promoter region deletion/duplication testing only), KIT, MEN1, MLH1,  MSH2, MSH3, MSH6, MUTYH, NBN, NF1, NHTL1, PALB2, PDGFRA, PMS2, POLD1, POLE, PTEN, RAD50, RAD51C, RAD51D, SDHB, SDHC, SDHD, SMAD4, SMARCA4. STK11, TP53, TSC1, TSC2, and VHL.  The following genes were evaluated for sequence changes only: SDHA and HOXB13 c.251G>A variant only.  The report date is June 25, 2017.    07/02/2017 Imaging   CT cAP 07/02/17 IMPRESSION: 1. Subareolar right breast mass. No compelling findings of nodal or metastatic involvement. 2. Old granulomatous disease. 3. There  is a 7 mm enhancing or hyperdense focus posteriorly in segment 7 of the liver in the subcapsular region. By virtue of its small size this lesion is technically nonspecific although statistically likely to be a small benign lesions such as flash filling hemangioma. This may warrant surveillance. 4.  Prominent stool throughout the colon favors constipation. 5. Degenerative disc disease and spondylosis at L5-S1 likely causing mild impingement.   07/02/2017 Imaging   Bone scan 07/02/17 IMPRESSION: Today' s exam is negative. I ascribed the tiny focus of activity just proximal to the left antecubital region to injection site.   07/22/2017 Surgery   RIGHT TOTAL MASTECTOMY WITH RIGHT AXILLARY SENTINEL LYMPH NODE BIOPSY, LEFT PROPHYLACTIC MASTECTOM and INSERTION PORT-A-CATH WITH US  by Dr. Ebbie and [facility]    07/22/2017 Pathology Results   Diagnosis 1. Breast, simple mastectomy, Left - FIBROADENOMA. - NO MALIGNANCY IDENTIFIED. 2. 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 - ADDITIONAL FOCUS OF LOBULAR CARCINOMA, GRADE 2, SPANNING 2.2 CM. - INTERMEDIATE GRADE DUCTAL CARCINOMA IN SITU. - INVASIVE CARCINOMA COMES TO WITHIN 0.2 TO 0.3 CM OF THE DEEP MARGIN, FOCALLY. - TUMOR FOCALLY INVOLVES EPIDERMIS. - LYMPHOVASCULAR INVASION OF DERMAL LYMPHATICS. - ONE OF ONE LYMPH NODES NEGATIVE FOR CARCINOMA (0/1). - SEE ONCOLOGY TABLE. 3. Lymph node, sentinel, biopsy, Right axillary - ONE LYMPH NODE WITH ISOLATED TUMOR CELLS (0/1). 4. Lymph node, sentinel, biopsy, Right - METASTATIC CARCINOMA IN ONE OF ONE LYMPH NODES (1/1). 5. Lymph node, sentinel, biopsy, Right - ONE OF ONE LYMPH NODES NEGATIVE FOR CARCINOMA (0/1). 6. Lymph node, sentinel, biopsy, Right - METASTATIC CARCINOMA IN ONE OF ONE LYMPH NODES (1/1). - EXTRACAPSULAR EXTENSION. 7. Lymph node, sentinel, biopsy, Right - METASTATIC CARCINOMA IN ONE OF ONE LYMPH NODES (1/1). -  EXTRACAPSULAR EXTENSION. 8. Lymph node, sentinel, biopsy, Right - ONE OF ONE LYMPH NODES NEGATIVE FOR CARCINOMA (0/1). 9. Lymph node, sentinel, biopsy, Right - ONE OF ONE LYMPH NODES NEGATIVE FOR CARCINOMA (0/1). 10. Lymph node, sentinel, biopsy, Right - METASTATIC CARCINOMA IN ONE OF ONE LYMPH NODES (1/1). - EXTRACAPSULAR EXTENSION. 11. Lymph node, biopsy, Right axillary - ONE OF ONE LYMPH NODES NEGATIVE FOR CARCINOMA (0/1).    07/22/2017 Receptors her2   Ductal carcinoma in primary breast tumor and node metastasis are ER 95%, PR 95% strongly positive, and HER2 + The lobular component in primary breast tumor is ER 95% positive, PR 95% positive, strong staining, HER-2 negative.   Original biopsy [id number]: Ductal morphology, Her2 FISH positive. Current specimen: Main focus is 75% ductal morphology, 25% lobular morphology. Smaller focus is lobular morphology. #2A Lobular morphology, Her IHC negative. #2E Lobular morphology, Her2 FISH negative, Her2 IHC negative. #4 50% lobular and 50% ductal morphology, Her2 FISH positive, Her2 IHC negative. #6 Ductal morphology #7 Ductal morphology #10 50% lobular, 50% ductal morphology   08/25/2017 Imaging   MRI abdomen done at [facility]:  Enhancing structure measing 5 mm is compatible with a flash filling hemangioma. No specific findings to  suggest metastatic cancer   08/25/2017 Imaging   MRI Abdomen at [facility] 08/25/17  IMPRESSION: 1. Lesion in segment 7 of the liver has signal and enhancement characteristics compatible with a flash filling hemangioma. No specific findings identified to suggest metastatic disease.   08/29/2017 - 12/12/2017 Chemotherapy   adjuvant TCHP every 3 weeks for 6 cycles starting 08/29/17. Postponed and decreased Carbo to 650mg  and added Onpro with cycle 2 on 09/19/17 due to neutropenic fever. Carbplatin further reduced to 500mg  starting with cycle 3 due to thrombocytopenia. Herceptin  was held for Cycle 4 and 5 due to  worsening global longitudinal strain found on ECHO from 11/07/17. Completed 6 cycles on 12/12/17      09/04/2017 - 09/09/2017 Hospital Admission   Admit date: 09/04/2017 Discharge date: 09/09/2017   DISCHARGE DIAGNOSES:  Principal Problem:   Sepsis [facility]) Active Problems:   Malignant neoplasm of upper-outer quadrant of right breast in female, estrogen receptor positive (HCC)   Port-A-Cath in place   Neutropenic fever (HCC)   Hyponatremia   Malignant neoplasm of breast in female, estrogen receptor positive (HCC)     12/28/2017 Imaging   CT CHEST IMPRESSION: 1. 2.3 x 2.7 x 1.2 cm irregular masslike opacity within the right middle lobe worrisome for malignancy/metastasis. 2. 3 new very small sclerotic lesions, within the sternum and the body of T4, worrisome for new metastases.   12/28/2017 - 01/05/2018 Hospital Admission   Admit date: 12/28/17-01/05/18 Admission diagnosis: multifocal pneumonia, influenza A, sepsis, hypoxia Additional comments: imaging during hospitalization notable for new irregular masslike opacity in the right middle lobe worrisome for metastasis and new very small sclerotic lesion within the sternum and the body of T4, worrisome for metastasis   12/29/2017 Imaging   CT ABD/PELVIS IMPRESSION: No definite acute intra-abdominal or intrapelvic abnormalities.   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   12/30/2017 Imaging   CT HEAD IMPRESSION: No acute intracranial abnormality or enhancing lesion.   01/16/2018 - 01/22/2019 Chemotherapy   Maintence Herceptin  and Perjeta  every 3 weeks for 6-12 months starting 01/16/18. Held 03/2018-07/17/18 due to low EF. Completed on 01/22/19    02/04/2018 PET scan   IMPRESSION: 1. The previous consolidation in the right middle lobe currently has a more nodular and reticular appearance with some associated mild atelectasis along its anterior margin. This demonstrates low-grade metabolic activity with  maximum SUV 3.2 (background blood pool activity is 2.7). Although conceivably related to low-grade residuum from the patient's malignancy, the presence of calcified right hilar lymph nodes and small calcifications along the nodularity raise suspicion for a low-grade active granulomatous process or atypical infectious process. Surveillance is recommended. 2. Scattered deposits of hypermetabolic but benign metabolically active brown fat.   02/23/2018 - 04/09/2018 Radiation Therapy   Radiation treatment dates:   02/23/2018 - 04/09/2018   Site/dose:   The patient initially received a dose of 50.4 Gy in 28 fractions to the right chest wall and supraclavicular region. This was delivered using a 3-D conformal, 4 field technique. The patient then received a boost to the mastectomy scar. This delivered an additional 10 Gy in 5 fractions using an en face electron field. The total dose was 60.4 Gy.   Narrative: The patient tolerated radiation treatment relatively well.   The patient had some expected skin irritation as she progressed during treatment. Moist desquamation was not present at the end of treatment.   03/20/2018 -  Anti-estrogen oral therapy   -Monthly Zoladex  injection starting 03/20/18  -  Letrozole  2.5mg  once dialy starting 05/03/18  -Switched to Anastrozole  starting 07/2018 Due to her worsening back pain. -Changed to Exemestane  in 04/2019 due to back pain exacerbation.    04/06/2018 Imaging   CT Chest wo contrast IMPRESSION: 1. Stable linear nodular thickening in the RIGHT middle lobe is favored post infectious or inflammatory scarring. 2. Calcified RIGHT hilar lymph node is consistent post infectious process. 3. Stable small LEFT upper lobe nodule. 4. Stable sclerotic lesion in the manubrium. Sclerotic lesion at T3 is more prominent (4 mm). Sclerotic lesion at L 1 is new from 07/02/2017.   04/06/2018 Imaging   04/06/2018 CT Chest IMPRESSION: 1. Stable linear nodular thickening in the RIGHT  middle lobe is favored post infectious or inflammatory scarring. 2. Calcified RIGHT hilar lymph node is consistent post infectious process. 3. Stable small LEFT upper lobe nodule. 4. Stable sclerotic lesion in the manubrium. Sclerotic lesion at T3 is more prominent (4 mm). Sclerotic lesion at L 1 is new from 07/02/2017.   04/10/2018 Echocardiogram   LV EF 45%-50%   05/25/2018 Echocardiogram   LV EF 45%-50%   07/13/2018 Echocardiogram   Normal LV size with EF 55-60%   07/31/2018 Imaging   07/31/2018 Bone Scan IMPRESSION: No evidence of skeletal metastatic disease.   07/31/2018 Imaging   07/31/2018 CT CAP IMPRESSION: Residual linear and nodular peribronchovascular opacities in the right middle lobe, with other scattered areas of ground-glass opacities in the right middle, right upper and right lower lobes, which are located within the previously demonstrated bulky airspace consolidation. The findings are improved from the chest CT dated 12/30/2017, and relatively stable from PET-CT dated 02/04/2018. These may represent post infectious/post inflammatory changes or residual malignancy.   Stable partially calcified right hilar lymphadenopathy.   Interval development of nodular soft tissue thickening within the anterior mediastinum in the area of the thymus, new from chest CT dated 12/30/2017. This may represent reactive thymic tissue, thymoma or lymphadenopathy.   1.8 cm probably physiologic left ovarian cyst. Borderline thickening of the endometrium measuring 15.6 mm. Further evaluation with pelvic ultrasound may be considered.   No new or suspicious findings within the abdomen.   09/15/2018 Echocardiogram   EF 55-60%   01/28/2019 Imaging   MRI Lumbar spie 01/28/19  IMPRESSION: 1. Subcentimeter lesions in the L1 and L2 vertebral bodies, indeterminate for metastases. 2. Mild lower lumbar disc degeneration without stenosis.   01/28/2019 Imaging   CT chest 01/28/19   IMPRESSION: Right lung post radiation changes. No active lung disease or other acute findings.   No evidence of recurrent or metastatic carcinoma within the thorax.   03/15/2019 - 02/2020 Chemotherapy   Anti-HER2 Neratinib  240 mg daily, starting 03/15/19. She completed 1 year of treatment in 02/2020       REVIEW OF SYSTEMS:   Constitutional: Denies fevers, chills or abnormal weight loss Eyes: Denies blurriness of vision Ears, nose, mouth, throat, and face: Denies mucositis or sore throat Respiratory: Denies cough, dyspnea or wheezes Cardiovascular: Denies palpitation, chest discomfort or lower extremity swelling Gastrointestinal:  Denies nausea, heartburn or change in bowel habits Skin: Denies abnormal skin rashes Lymphatics: Denies new lymphadenopathy or easy bruising Neurological:Denies numbness, tingling or new weaknesses Behavioral/Psych: Mood is stable, no new changes  All other systems were reviewed with the patient and are negative.   VITALS:   Today's Vitals   12/16/24 0819 12/16/24 0820  BP: 103/69   Pulse: (!) 109   Resp: 17   Temp: 98.3 F (36.8 C)  SpO2: 100%   Weight: 173 lb 4.8 oz (78.6 kg)   PainSc:  0-No pain   Body mass index is 27.97 kg/m.   Wt Readings from Last 3 Encounters:  12/16/24 173 lb 4.8 oz (78.6 kg)  04/04/23 165 lb 6.4 oz (75 kg)  09/23/22 159 lb 9.6 oz (72.4 kg)    Body mass index is 27.97 kg/m.  Performance status (ECOG): 1 - Symptomatic but completely ambulatory  PHYSICAL EXAM:   GENERAL:alert, no distress and comfortable SKIN: skin color, texture, turgor are normal, no rashes or significant lesions EYES: normal, Conjunctiva are pink and non-injected, sclera clear OROPHARYNX:no exudate, no erythema and lips, buccal mucosa, and tongue normal  NECK: supple, thyroid  normal size, non-tender, without nodularity LYMPH:  no palpable lymphadenopathy in the cervical, axillary or inguinal LUNGS: clear to auscultation and percussion  with normal breathing effort HEART: regular rate & rhythm and no murmurs and no lower extremity edema ABDOMEN:abdomen soft, non-tender and normal bowel sounds Musculoskeletal:no cyanosis of digits and no clubbing  NEURO: alert & oriented x 3 with fluent speech, no focal motor/sensory deficits BREAST: bilateral mastectomy surgical scars are well healed. No palpable masses or lumps along the chest wall. There is no axillary lymphadenopathy bilaterally.   LABORATORY DATA:  I have reviewed the data as listed    Component Value Date/Time   NA 139 12/16/2024 0749   NA 139 11/21/2017 0949   K 4.9 12/16/2024 0749   K 3.7 11/21/2017 0949   CL 103 12/16/2024 0749   CO2 24 12/16/2024 0749   CO2 23 11/21/2017 0949   GLUCOSE 126 (H) 12/16/2024 0749   GLUCOSE 225 (H) 11/21/2017 0949   BUN 10 12/16/2024 0749   BUN 10.5 11/21/2017 0949   CREATININE 1.07 (H) 12/16/2024 0749   CREATININE 0.9 11/21/2017 0949   CALCIUM  9.5 12/16/2024 0749   CALCIUM  9.1 11/21/2017 0949   PROT 7.6 12/16/2024 0749   PROT 6.5 11/21/2017 0949   ALBUMIN 4.6 12/16/2024 0749   ALBUMIN 3.5 11/21/2017 0949   AST 21 12/16/2024 0749   AST 23 11/21/2017 0949   ALT 16 12/16/2024 0749   ALT 31 11/21/2017 0949   ALKPHOS 134 (H) 12/16/2024 0749   ALKPHOS 62 11/21/2017 0949   BILITOT 0.5 12/16/2024 0749   BILITOT 0.40 11/21/2017 0949   GFRNONAA >60 12/16/2024 0749   GFRAA >60 05/05/2020 1438    Lab Results  Component Value Date   WBC 3.4 (L) 12/16/2024   NEUTROABS 1.7 12/16/2024   HGB 11.4 (L) 12/16/2024   HCT 35.6 (L) 12/16/2024   MCV 78.6 (L) 12/16/2024   PLT 254 12/16/2024     "

## 2024-12-14 NOTE — Assessment & Plan Note (Signed)
 mixed invasive ductal and lobular carcinoma, pT3(m)N2aM0, stage 1b, ductal carcinoma triple positive, lobular carcinoma ER+/PR+/HER2-,  Grade 2 -diagnosed in 05/2017. S/p b/l mastectomy, adjuvant TCHP, radiation, maintenance Herceptin /Perjeta , and 1 year of Neratinib .  -She started adjuvant letrozole  with monthly Zoladex  injections in 04/2018.  Letrozole  was subsequently changed to anastrozole , then exemestane  due to some joint and back pain. She tolerates exemestane  well with mild hot flashes, tolerable. plan for a total of 10 years until 04/2028 -she had BSO in 08/2022  -12/17/2024 - patient clinically doing well. Continues exemestane  daily. Close breast cancer surveillance with labs and follow up in 6 months, sooner if needed.

## 2024-12-16 ENCOUNTER — Inpatient Hospital Stay (HOSPITAL_BASED_OUTPATIENT_CLINIC_OR_DEPARTMENT_OTHER): Admitting: Nurse Practitioner

## 2024-12-16 ENCOUNTER — Encounter: Payer: Self-pay | Admitting: Nurse Practitioner

## 2024-12-16 ENCOUNTER — Inpatient Hospital Stay: Attending: Nurse Practitioner

## 2024-12-16 ENCOUNTER — Ambulatory Visit: Payer: Self-pay | Admitting: Nurse Practitioner

## 2024-12-16 VITALS — BP 103/69 | HR 109 | Temp 98.3°F | Resp 17 | Wt 173.3 lb

## 2024-12-16 DIAGNOSIS — C50411 Malignant neoplasm of upper-outer quadrant of right female breast: Secondary | ICD-10-CM | POA: Diagnosis present

## 2024-12-16 DIAGNOSIS — Z170 Estrogen receptor positive status [ER+]: Secondary | ICD-10-CM | POA: Insufficient documentation

## 2024-12-16 DIAGNOSIS — Z9013 Acquired absence of bilateral breasts and nipples: Secondary | ICD-10-CM | POA: Diagnosis not present

## 2024-12-16 DIAGNOSIS — Z1721 Progesterone receptor positive status: Secondary | ICD-10-CM | POA: Insufficient documentation

## 2024-12-16 DIAGNOSIS — Z9221 Personal history of antineoplastic chemotherapy: Secondary | ICD-10-CM | POA: Insufficient documentation

## 2024-12-16 DIAGNOSIS — Z79811 Long term (current) use of aromatase inhibitors: Secondary | ICD-10-CM | POA: Diagnosis not present

## 2024-12-16 DIAGNOSIS — Z1731 Human epidermal growth factor receptor 2 positive status: Secondary | ICD-10-CM | POA: Diagnosis not present

## 2024-12-16 DIAGNOSIS — Z923 Personal history of irradiation: Secondary | ICD-10-CM | POA: Insufficient documentation

## 2024-12-16 DIAGNOSIS — D509 Iron deficiency anemia, unspecified: Secondary | ICD-10-CM | POA: Insufficient documentation

## 2024-12-16 LAB — CBC WITH DIFFERENTIAL (CANCER CENTER ONLY)
Abs Immature Granulocytes: 0.01 10*3/uL (ref 0.00–0.07)
Basophils Absolute: 0 10*3/uL (ref 0.0–0.1)
Basophils Relative: 1 %
Eosinophils Absolute: 0.1 10*3/uL (ref 0.0–0.5)
Eosinophils Relative: 3 %
HCT: 35.6 % — ABNORMAL LOW (ref 36.0–46.0)
Hemoglobin: 11.4 g/dL — ABNORMAL LOW (ref 12.0–15.0)
Immature Granulocytes: 0 %
Lymphocytes Relative: 36 %
Lymphs Abs: 1.2 10*3/uL (ref 0.7–4.0)
MCH: 25.2 pg — ABNORMAL LOW (ref 26.0–34.0)
MCHC: 32 g/dL (ref 30.0–36.0)
MCV: 78.6 fL — ABNORMAL LOW (ref 80.0–100.0)
Monocytes Absolute: 0.3 10*3/uL (ref 0.1–1.0)
Monocytes Relative: 10 %
Neutro Abs: 1.7 10*3/uL (ref 1.7–7.7)
Neutrophils Relative %: 50 %
Platelet Count: 254 10*3/uL (ref 150–400)
RBC: 4.53 MIL/uL (ref 3.87–5.11)
RDW: 14.3 % (ref 11.5–15.5)
WBC Count: 3.4 10*3/uL — ABNORMAL LOW (ref 4.0–10.5)
nRBC: 0 % (ref 0.0–0.2)

## 2024-12-16 LAB — CMP (CANCER CENTER ONLY)
ALT: 16 U/L (ref 0–44)
AST: 21 U/L (ref 15–41)
Albumin: 4.6 g/dL (ref 3.5–5.0)
Alkaline Phosphatase: 134 U/L — ABNORMAL HIGH (ref 38–126)
Anion gap: 12 (ref 5–15)
BUN: 10 mg/dL (ref 6–20)
CO2: 24 mmol/L (ref 22–32)
Calcium: 9.5 mg/dL (ref 8.9–10.3)
Chloride: 103 mmol/L (ref 98–111)
Creatinine: 1.07 mg/dL — ABNORMAL HIGH (ref 0.44–1.00)
GFR, Estimated: >60 mL/min
Glucose, Bld: 126 mg/dL — ABNORMAL HIGH (ref 70–99)
Potassium: 4.9 mmol/L (ref 3.5–5.1)
Sodium: 139 mmol/L (ref 135–145)
Total Bilirubin: 0.5 mg/dL (ref 0.0–1.2)
Total Protein: 7.6 g/dL (ref 6.5–8.1)

## 2024-12-16 LAB — FERRITIN: Ferritin: 7 ng/mL — ABNORMAL LOW (ref 11–307)

## 2024-12-17 LAB — CANCER ANTIGEN 27.29: CA 27.29: 11.5 U/mL (ref 0.0–38.6)

## 2025-06-15 ENCOUNTER — Inpatient Hospital Stay: Admitting: Nurse Practitioner

## 2025-06-15 ENCOUNTER — Inpatient Hospital Stay
